# Patient Record
Sex: Female | Born: 1945
Health system: Southern US, Community
[De-identification: ages and names within clinical notes are randomized; demographics above are authoritative.]

## PROBLEM LIST (undated history)

## (undated) DIAGNOSIS — H5440 Blindness, one eye, unspecified eye: Secondary | ICD-10-CM

## (undated) DIAGNOSIS — I999 Unspecified disorder of circulatory system: Secondary | ICD-10-CM

## (undated) DIAGNOSIS — E119 Type 2 diabetes mellitus without complications: Secondary | ICD-10-CM

## (undated) DIAGNOSIS — E785 Hyperlipidemia, unspecified: Secondary | ICD-10-CM

## (undated) DIAGNOSIS — H409 Unspecified glaucoma: Secondary | ICD-10-CM

## (undated) DIAGNOSIS — I82409 Acute embolism and thrombosis of unspecified deep veins of unspecified lower extremity: Secondary | ICD-10-CM

## (undated) DIAGNOSIS — F32A Depression, unspecified: Secondary | ICD-10-CM

## (undated) DIAGNOSIS — I509 Heart failure, unspecified: Secondary | ICD-10-CM

## (undated) DIAGNOSIS — I5189 Other ill-defined heart diseases: Secondary | ICD-10-CM

## (undated) DIAGNOSIS — F329 Major depressive disorder, single episode, unspecified: Secondary | ICD-10-CM

## (undated) DIAGNOSIS — I10 Essential (primary) hypertension: Secondary | ICD-10-CM

## (undated) HISTORY — DX: Blindness, one eye, unspecified eye: H54.40

## (undated) HISTORY — DX: Unspecified glaucoma: H40.9

## (undated) HISTORY — DX: Type 2 diabetes mellitus without complications: E11.9

## (undated) HISTORY — DX: Depression, unspecified: F32.A

## (undated) HISTORY — DX: Unspecified disorder of circulatory system: I99.9

## (undated) HISTORY — PX: VASCULAR SURGERY: SHX849

## (undated) HISTORY — DX: Hyperlipidemia, unspecified: E78.5

## (undated) HISTORY — DX: Heart failure, unspecified: I50.9

## (undated) HISTORY — DX: Major depressive disorder, single episode, unspecified: F32.9

## (undated) HISTORY — DX: Acute embolism and thrombosis of unspecified deep veins of unspecified lower extremity: I82.409

## (undated) HISTORY — PX: EYE SURGERY: SHX253

## (undated) HISTORY — DX: Essential (primary) hypertension: I10

---

## 1990-05-26 HISTORY — PX: ABDOMINAL HYSTERECTOMY: SHX81

## 2006-12-04 ENCOUNTER — Emergency Department: Payer: Self-pay | Admitting: Emergency Medicine

## 2006-12-04 ENCOUNTER — Other Ambulatory Visit: Payer: Self-pay

## 2007-01-20 ENCOUNTER — Ambulatory Visit: Payer: Self-pay | Admitting: Ophthalmology

## 2007-03-10 ENCOUNTER — Ambulatory Visit: Payer: Self-pay | Admitting: Family Medicine

## 2007-03-10 LAB — HM DEXA SCAN: HM DEXA SCAN: NORMAL

## 2007-06-02 ENCOUNTER — Ambulatory Visit: Payer: Self-pay | Admitting: Ophthalmology

## 2009-04-11 ENCOUNTER — Ambulatory Visit (HOSPITAL_COMMUNITY): Admission: RE | Admit: 2009-04-11 | Discharge: 2009-04-11 | Payer: Self-pay | Admitting: Cardiovascular Disease

## 2009-04-24 ENCOUNTER — Ambulatory Visit: Payer: Self-pay | Admitting: Vascular Surgery

## 2009-05-26 DIAGNOSIS — I82409 Acute embolism and thrombosis of unspecified deep veins of unspecified lower extremity: Secondary | ICD-10-CM

## 2009-05-26 HISTORY — DX: Acute embolism and thrombosis of unspecified deep veins of unspecified lower extremity: I82.409

## 2009-05-26 HISTORY — PX: TOE AMPUTATION: SHX809

## 2009-08-03 ENCOUNTER — Ambulatory Visit: Payer: Self-pay | Admitting: Vascular Surgery

## 2009-08-07 ENCOUNTER — Ambulatory Visit: Payer: Self-pay | Admitting: Podiatry

## 2009-08-10 ENCOUNTER — Ambulatory Visit: Payer: Self-pay | Admitting: Podiatry

## 2009-08-24 ENCOUNTER — Ambulatory Visit: Payer: Self-pay | Admitting: Podiatry

## 2009-09-03 ENCOUNTER — Emergency Department: Payer: Self-pay | Admitting: Emergency Medicine

## 2009-09-06 ENCOUNTER — Ambulatory Visit: Payer: Self-pay | Admitting: Internal Medicine

## 2009-09-07 ENCOUNTER — Ambulatory Visit: Payer: Self-pay | Admitting: Internal Medicine

## 2009-09-13 ENCOUNTER — Emergency Department: Payer: Self-pay | Admitting: Emergency Medicine

## 2009-10-12 ENCOUNTER — Encounter: Payer: Self-pay | Admitting: Internal Medicine

## 2009-10-24 ENCOUNTER — Encounter: Payer: Self-pay | Admitting: Internal Medicine

## 2009-12-03 ENCOUNTER — Emergency Department: Payer: Self-pay | Admitting: Emergency Medicine

## 2009-12-25 ENCOUNTER — Ambulatory Visit: Payer: Self-pay | Admitting: Internal Medicine

## 2009-12-25 ENCOUNTER — Inpatient Hospital Stay: Payer: Self-pay | Admitting: Vascular Surgery

## 2010-01-21 ENCOUNTER — Ambulatory Visit: Payer: Self-pay | Admitting: Internal Medicine

## 2010-01-23 ENCOUNTER — Encounter: Payer: Self-pay | Admitting: Internal Medicine

## 2010-01-24 ENCOUNTER — Encounter: Payer: Self-pay | Admitting: Internal Medicine

## 2010-02-23 ENCOUNTER — Encounter: Payer: Self-pay | Admitting: Internal Medicine

## 2010-03-27 ENCOUNTER — Ambulatory Visit: Payer: Self-pay | Admitting: Vascular Surgery

## 2010-03-29 ENCOUNTER — Inpatient Hospital Stay: Payer: Self-pay | Admitting: Podiatry

## 2010-04-01 LAB — PATHOLOGY REPORT

## 2010-05-01 ENCOUNTER — Ambulatory Visit: Payer: Self-pay | Admitting: Pain Medicine

## 2010-06-04 ENCOUNTER — Ambulatory Visit: Payer: Self-pay | Admitting: Pain Medicine

## 2010-08-05 ENCOUNTER — Ambulatory Visit: Payer: Self-pay | Admitting: Vascular Surgery

## 2011-06-06 DIAGNOSIS — H4010X0 Unspecified open-angle glaucoma, stage unspecified: Secondary | ICD-10-CM | POA: Diagnosis not present

## 2011-07-03 DIAGNOSIS — H4010X0 Unspecified open-angle glaucoma, stage unspecified: Secondary | ICD-10-CM | POA: Diagnosis not present

## 2011-08-20 DIAGNOSIS — E1139 Type 2 diabetes mellitus with other diabetic ophthalmic complication: Secondary | ICD-10-CM | POA: Diagnosis not present

## 2011-08-20 DIAGNOSIS — E559 Vitamin D deficiency, unspecified: Secondary | ICD-10-CM | POA: Diagnosis not present

## 2011-08-20 DIAGNOSIS — E785 Hyperlipidemia, unspecified: Secondary | ICD-10-CM | POA: Diagnosis not present

## 2011-08-20 DIAGNOSIS — I10 Essential (primary) hypertension: Secondary | ICD-10-CM | POA: Diagnosis not present

## 2011-08-21 DIAGNOSIS — H4010X0 Unspecified open-angle glaucoma, stage unspecified: Secondary | ICD-10-CM | POA: Diagnosis not present

## 2011-09-04 DIAGNOSIS — E785 Hyperlipidemia, unspecified: Secondary | ICD-10-CM | POA: Diagnosis not present

## 2011-09-04 DIAGNOSIS — I70219 Atherosclerosis of native arteries of extremities with intermittent claudication, unspecified extremity: Secondary | ICD-10-CM | POA: Diagnosis not present

## 2011-09-04 DIAGNOSIS — M79609 Pain in unspecified limb: Secondary | ICD-10-CM | POA: Diagnosis not present

## 2011-09-04 DIAGNOSIS — I10 Essential (primary) hypertension: Secondary | ICD-10-CM | POA: Diagnosis not present

## 2011-12-04 DIAGNOSIS — H4010X0 Unspecified open-angle glaucoma, stage unspecified: Secondary | ICD-10-CM | POA: Diagnosis not present

## 2011-12-23 DIAGNOSIS — I10 Essential (primary) hypertension: Secondary | ICD-10-CM | POA: Diagnosis not present

## 2011-12-23 DIAGNOSIS — E785 Hyperlipidemia, unspecified: Secondary | ICD-10-CM | POA: Diagnosis not present

## 2011-12-23 DIAGNOSIS — F329 Major depressive disorder, single episode, unspecified: Secondary | ICD-10-CM | POA: Diagnosis not present

## 2011-12-23 DIAGNOSIS — E1139 Type 2 diabetes mellitus with other diabetic ophthalmic complication: Secondary | ICD-10-CM | POA: Diagnosis not present

## 2012-02-02 DIAGNOSIS — H33009 Unspecified retinal detachment with retinal break, unspecified eye: Secondary | ICD-10-CM | POA: Diagnosis not present

## 2012-02-05 DIAGNOSIS — H4010X0 Unspecified open-angle glaucoma, stage unspecified: Secondary | ICD-10-CM | POA: Diagnosis not present

## 2012-03-11 DIAGNOSIS — H4010X0 Unspecified open-angle glaucoma, stage unspecified: Secondary | ICD-10-CM | POA: Diagnosis not present

## 2012-04-15 DIAGNOSIS — H4010X0 Unspecified open-angle glaucoma, stage unspecified: Secondary | ICD-10-CM | POA: Diagnosis not present

## 2012-04-27 DIAGNOSIS — E785 Hyperlipidemia, unspecified: Secondary | ICD-10-CM | POA: Diagnosis not present

## 2012-04-27 DIAGNOSIS — Z1159 Encounter for screening for other viral diseases: Secondary | ICD-10-CM | POA: Diagnosis not present

## 2012-04-27 DIAGNOSIS — E559 Vitamin D deficiency, unspecified: Secondary | ICD-10-CM | POA: Diagnosis not present

## 2012-04-27 DIAGNOSIS — R5381 Other malaise: Secondary | ICD-10-CM | POA: Diagnosis not present

## 2012-04-27 DIAGNOSIS — S98139A Complete traumatic amputation of one unspecified lesser toe, initial encounter: Secondary | ICD-10-CM | POA: Diagnosis not present

## 2012-04-27 DIAGNOSIS — R5383 Other fatigue: Secondary | ICD-10-CM | POA: Diagnosis not present

## 2012-04-27 DIAGNOSIS — I10 Essential (primary) hypertension: Secondary | ICD-10-CM | POA: Diagnosis not present

## 2012-04-27 DIAGNOSIS — E1139 Type 2 diabetes mellitus with other diabetic ophthalmic complication: Secondary | ICD-10-CM | POA: Diagnosis not present

## 2012-07-15 DIAGNOSIS — H4010X0 Unspecified open-angle glaucoma, stage unspecified: Secondary | ICD-10-CM | POA: Diagnosis not present

## 2012-09-20 DIAGNOSIS — H33009 Unspecified retinal detachment with retinal break, unspecified eye: Secondary | ICD-10-CM | POA: Diagnosis not present

## 2012-09-29 DIAGNOSIS — M79609 Pain in unspecified limb: Secondary | ICD-10-CM | POA: Diagnosis not present

## 2012-09-29 DIAGNOSIS — E119 Type 2 diabetes mellitus without complications: Secondary | ICD-10-CM | POA: Diagnosis not present

## 2012-09-29 DIAGNOSIS — I70219 Atherosclerosis of native arteries of extremities with intermittent claudication, unspecified extremity: Secondary | ICD-10-CM | POA: Diagnosis not present

## 2012-09-29 DIAGNOSIS — I739 Peripheral vascular disease, unspecified: Secondary | ICD-10-CM | POA: Diagnosis not present

## 2012-10-28 DIAGNOSIS — H4010X0 Unspecified open-angle glaucoma, stage unspecified: Secondary | ICD-10-CM | POA: Diagnosis not present

## 2013-03-01 DIAGNOSIS — E11319 Type 2 diabetes mellitus with unspecified diabetic retinopathy without macular edema: Secondary | ICD-10-CM | POA: Diagnosis not present

## 2013-03-01 DIAGNOSIS — Z23 Encounter for immunization: Secondary | ICD-10-CM | POA: Diagnosis not present

## 2013-03-01 DIAGNOSIS — E1139 Type 2 diabetes mellitus with other diabetic ophthalmic complication: Secondary | ICD-10-CM | POA: Diagnosis not present

## 2013-03-01 DIAGNOSIS — I10 Essential (primary) hypertension: Secondary | ICD-10-CM | POA: Diagnosis not present

## 2013-03-01 DIAGNOSIS — E785 Hyperlipidemia, unspecified: Secondary | ICD-10-CM | POA: Diagnosis not present

## 2013-03-01 DIAGNOSIS — Z1331 Encounter for screening for depression: Secondary | ICD-10-CM | POA: Diagnosis not present

## 2013-03-29 ENCOUNTER — Ambulatory Visit: Payer: Self-pay | Admitting: Family Medicine

## 2013-03-29 DIAGNOSIS — Z1231 Encounter for screening mammogram for malignant neoplasm of breast: Secondary | ICD-10-CM | POA: Diagnosis not present

## 2013-03-29 LAB — HM MAMMOGRAPHY: HM Mammogram: NORMAL

## 2013-04-06 DIAGNOSIS — I10 Essential (primary) hypertension: Secondary | ICD-10-CM | POA: Diagnosis not present

## 2013-04-06 DIAGNOSIS — I70219 Atherosclerosis of native arteries of extremities with intermittent claudication, unspecified extremity: Secondary | ICD-10-CM | POA: Diagnosis not present

## 2013-04-06 DIAGNOSIS — F17200 Nicotine dependence, unspecified, uncomplicated: Secondary | ICD-10-CM | POA: Diagnosis not present

## 2013-04-06 DIAGNOSIS — E119 Type 2 diabetes mellitus without complications: Secondary | ICD-10-CM | POA: Diagnosis not present

## 2013-04-07 DIAGNOSIS — H4010X0 Unspecified open-angle glaucoma, stage unspecified: Secondary | ICD-10-CM | POA: Diagnosis not present

## 2013-09-01 DIAGNOSIS — IMO0001 Reserved for inherently not codable concepts without codable children: Secondary | ICD-10-CM | POA: Diagnosis not present

## 2013-09-01 DIAGNOSIS — I10 Essential (primary) hypertension: Secondary | ICD-10-CM | POA: Diagnosis not present

## 2013-09-01 DIAGNOSIS — E1142 Type 2 diabetes mellitus with diabetic polyneuropathy: Secondary | ICD-10-CM | POA: Diagnosis not present

## 2013-09-01 DIAGNOSIS — E1149 Type 2 diabetes mellitus with other diabetic neurological complication: Secondary | ICD-10-CM | POA: Diagnosis not present

## 2013-10-04 DIAGNOSIS — I70219 Atherosclerosis of native arteries of extremities with intermittent claudication, unspecified extremity: Secondary | ICD-10-CM | POA: Diagnosis not present

## 2013-10-04 DIAGNOSIS — I739 Peripheral vascular disease, unspecified: Secondary | ICD-10-CM | POA: Diagnosis not present

## 2013-10-04 DIAGNOSIS — F17200 Nicotine dependence, unspecified, uncomplicated: Secondary | ICD-10-CM | POA: Diagnosis not present

## 2013-10-04 DIAGNOSIS — E119 Type 2 diabetes mellitus without complications: Secondary | ICD-10-CM | POA: Diagnosis not present

## 2013-10-06 DIAGNOSIS — H4010X0 Unspecified open-angle glaucoma, stage unspecified: Secondary | ICD-10-CM | POA: Diagnosis not present

## 2014-03-08 DIAGNOSIS — E1140 Type 2 diabetes mellitus with diabetic neuropathy, unspecified: Secondary | ICD-10-CM | POA: Diagnosis not present

## 2014-03-08 DIAGNOSIS — E559 Vitamin D deficiency, unspecified: Secondary | ICD-10-CM | POA: Diagnosis not present

## 2014-03-08 DIAGNOSIS — E785 Hyperlipidemia, unspecified: Secondary | ICD-10-CM | POA: Diagnosis not present

## 2014-03-08 DIAGNOSIS — R5383 Other fatigue: Secondary | ICD-10-CM | POA: Diagnosis not present

## 2014-03-08 DIAGNOSIS — Z23 Encounter for immunization: Secondary | ICD-10-CM | POA: Diagnosis not present

## 2014-03-08 DIAGNOSIS — I739 Peripheral vascular disease, unspecified: Secondary | ICD-10-CM | POA: Diagnosis not present

## 2014-03-08 DIAGNOSIS — E1142 Type 2 diabetes mellitus with diabetic polyneuropathy: Secondary | ICD-10-CM | POA: Diagnosis not present

## 2014-03-08 DIAGNOSIS — G629 Polyneuropathy, unspecified: Secondary | ICD-10-CM | POA: Diagnosis not present

## 2014-03-08 DIAGNOSIS — R5381 Other malaise: Secondary | ICD-10-CM | POA: Diagnosis not present

## 2014-03-08 DIAGNOSIS — F329 Major depressive disorder, single episode, unspecified: Secondary | ICD-10-CM | POA: Diagnosis not present

## 2014-03-08 DIAGNOSIS — I10 Essential (primary) hypertension: Secondary | ICD-10-CM | POA: Diagnosis not present

## 2014-03-08 LAB — LIPID PANEL
CHOLESTEROL: 136 mg/dL (ref 0–200)
HDL: 61 mg/dL (ref 35–70)
LDL Cholesterol: 55 mg/dL
TRIGLYCERIDES: 102 mg/dL (ref 40–160)

## 2014-04-04 DIAGNOSIS — H4011X2 Primary open-angle glaucoma, moderate stage: Secondary | ICD-10-CM | POA: Diagnosis not present

## 2014-06-21 DIAGNOSIS — L989 Disorder of the skin and subcutaneous tissue, unspecified: Secondary | ICD-10-CM | POA: Diagnosis not present

## 2014-07-13 ENCOUNTER — Encounter: Payer: Self-pay | Admitting: *Deleted

## 2014-07-13 DIAGNOSIS — G5791 Unspecified mononeuropathy of right lower limb: Secondary | ICD-10-CM | POA: Diagnosis not present

## 2014-07-13 DIAGNOSIS — L989 Disorder of the skin and subcutaneous tissue, unspecified: Secondary | ICD-10-CM | POA: Diagnosis not present

## 2014-07-13 DIAGNOSIS — I10 Essential (primary) hypertension: Secondary | ICD-10-CM | POA: Diagnosis not present

## 2014-07-13 DIAGNOSIS — E1140 Type 2 diabetes mellitus with diabetic neuropathy, unspecified: Secondary | ICD-10-CM | POA: Diagnosis not present

## 2014-07-13 DIAGNOSIS — I739 Peripheral vascular disease, unspecified: Secondary | ICD-10-CM | POA: Diagnosis not present

## 2014-07-13 DIAGNOSIS — R809 Proteinuria, unspecified: Secondary | ICD-10-CM | POA: Diagnosis not present

## 2014-07-13 DIAGNOSIS — G5792 Unspecified mononeuropathy of left lower limb: Secondary | ICD-10-CM | POA: Diagnosis not present

## 2014-07-13 DIAGNOSIS — E538 Deficiency of other specified B group vitamins: Secondary | ICD-10-CM | POA: Diagnosis not present

## 2014-07-13 LAB — HEMOGLOBIN A1C: HEMOGLOBIN A1C: 6.5 % — AB (ref 4.0–6.0)

## 2014-07-27 ENCOUNTER — Ambulatory Visit (INDEPENDENT_AMBULATORY_CARE_PROVIDER_SITE_OTHER): Payer: Medicare Other | Admitting: General Surgery

## 2014-07-27 ENCOUNTER — Encounter: Payer: Self-pay | Admitting: General Surgery

## 2014-07-27 VITALS — BP 130/74 | HR 72 | Resp 12 | Ht 64.5 in | Wt 155.0 lb

## 2014-07-27 DIAGNOSIS — L98499 Non-pressure chronic ulcer of skin of other sites with unspecified severity: Secondary | ICD-10-CM | POA: Diagnosis not present

## 2014-07-27 NOTE — Progress Notes (Signed)
Patient ID: Lisa AngelBeulah Koppel, female   DOB: [birth year], 69 y.o.   MRN: [id number]  Chief Complaint  Patient presents with  . Other    evaluation of lesion on back of leg    HPI Lisa [name] is a 69 y.o. female who presents for an evaluation of a right thigh mole. She noticed it last year. She states the area is painful and has gotten larger. She has had pus and blood to drain from the area.   HPI  Past Medical History  Diagnosis Date  . Diabetes mellitus without complication   . Hypertension   . Glaucoma   . Vascular disease   . Blindness of right eye     Past Surgical History  Procedure Laterality Date  . Abdominal hysterectomy    . Eye surgery    . Vascular surgery    . Toe amputation Left 2011    all toes    Family History  Problem Relation Age of Onset  . Diabetes Mother   . Diabetes Sister   . Diabetes Sister     Social History History  Substance Use Topics  . Smoking status: Current Every Day Smoker -- 0.50 packs/day for 18 years  . Smokeless tobacco: Never Used  . Alcohol Use: Yes    Allergies  Allergen Reactions  . Other Itching    Nucinta    Current Outpatient Prescriptions  Medication Sig Dispense Refill  . aspirin 81 MG tablet Take 81 mg by mouth daily.    Marland Kitchen. atropine 1 % ophthalmic ointment Place 1 application into the right eye 3 (three) times daily.    . citalopram (CELEXA) 20 MG tablet Take 20 mg by mouth daily.  3  . clopidogrel (PLAVIX) 75 MG tablet Take 75 mg by mouth daily.  5  . CRESTOR 10 MG tablet Take 10 mg by mouth daily.  3  . dorzolamide-timolol (COSOPT) 22.3-6.8 MG/ML ophthalmic solution Place 1 drop into both eyes 2 (two) times daily.  6  . HYDROcodone-acetaminophen (NORCO/VICODIN) 5-325 MG per tablet Take 1 tablet by mouth every 6 (six) hours as needed. for pain  0  . JANUMET XR [phone] MG TB24 Take 1 tablet by mouth daily.  2  . losartan-hydrochlorothiazide (HYZAAR) 50-12.5 MG per tablet Take 1 tablet by mouth daily.  2  .  Multiple Vitamin (MULTIVITAMIN) tablet Take 1 tablet by mouth daily.    . prednisoLONE acetate (PRED FORTE) 1 % ophthalmic suspension Place 1 drop into the right eye 4 (four) times daily.  3  . sulfamethoxazole-trimethoprim (BACTRIM,SEPTRA) 400-80 MG per tablet Take 1 tablet by mouth 2 (two) times daily.  0  . traMADol (ULTRAM) 50 MG tablet Take 50 mg by mouth every 6 (six) hours as needed.    . vitamin B-12 (CYANOCOBALAMIN) 1000 MCG tablet Take 1,000 mcg by mouth daily.     No current facility-administered medications for this visit.    Review of Systems Review of Systems  Constitutional: Negative.   Respiratory: Negative.   Cardiovascular: Negative.     Blood pressure 130/74, pulse 72, resp. rate 12, height 5' 4.5" (1.638 m), weight 155 lb (70.308 kg).  Physical Exam Physical Exam  Constitutional: She is oriented to person, place, and time. She appears well-developed and well-nourished.  Cardiovascular: Normal rate, regular rhythm and normal heart sounds.   No murmur heard. Pulmonary/Chest: Effort normal and breath sounds normal.  Lymphadenopathy:       Right: No inguinal adenopathy present.  Left: No inguinal adenopathy present.  Neurological: She is alert and oriented to person, place, and time.  Skin: Skin is warm and dry.  2 cm circular ulcer on the back of right thigh. Appears covered with fibrinous exudate. The area looks clean and not infected.  Margin is sharp and not everted  Data Reviewed Dr. Carlynn Purl' notes  Assessment    Skin ulcer right thgh     Plan    Dress with antibiotic int daily. Recheck in weeks. If no sign of healing will need surgical excision. Pt agreeable with the plan.       SANKAR,SEEPLAPUTHUR G 07/27/2014, 11:13 AM

## 2014-07-27 NOTE — Patient Instructions (Addendum)
Patient advised to use neosporin once daily on the area and cover with large band aid. Patient also advised not to pick at the area. Patient to return in 2 weeks for follow up appointment.

## 2014-08-10 ENCOUNTER — Ambulatory Visit (INDEPENDENT_AMBULATORY_CARE_PROVIDER_SITE_OTHER): Payer: Medicare Other | Admitting: General Surgery

## 2014-08-10 ENCOUNTER — Encounter: Payer: Self-pay | Admitting: General Surgery

## 2014-08-10 VITALS — BP 120/72 | HR 74 | Resp 12 | Ht 64.5 in | Wt 149.0 lb

## 2014-08-10 DIAGNOSIS — L98499 Non-pressure chronic ulcer of skin of other sites with unspecified severity: Secondary | ICD-10-CM

## 2014-08-10 NOTE — Progress Notes (Signed)
[name], female   DOB: 1945-12-19, 69 y.o.   MRN: [id number]  Chief Complaint  Patient presents with  . Follow-up    HPI Lisa AngelBeulah Leavelle is a 69 y.o. female.  here today for follow up right thigh mass. Minimal drainage and minimal tenderness. She states it looks better.    HPI  Past Medical History  Diagnosis Date  . Diabetes mellitus without complication   . Hypertension   . Glaucoma   . Vascular disease   . Blindness of right eye     Past Surgical History  Procedure Laterality Date  . Abdominal hysterectomy    . Eye surgery    . Vascular surgery    . Toe amputation Left 2011    all toes    Family History  Problem Relation Age of Onset  . Diabetes Mother   . Diabetes Sister   . Diabetes Sister     Social History History  Substance Use Topics  . Smoking status: Current Every Day Smoker -- 0.50 packs/day for 18 years  . Smokeless tobacco: Never Used  . Alcohol Use: Yes    Allergies  Allergen Reactions  . Other Itching    Nucinta    Current Outpatient Prescriptions  Medication Sig Dispense Refill  . aspirin 81 MG tablet Take 81 mg by mouth daily.    Marland Kitchen. atropine 1 % ophthalmic ointment Place 1 application into the right eye 3 (three) times daily.    . citalopram (CELEXA) 20 MG tablet Take 20 mg by mouth daily.  3  . clopidogrel (PLAVIX) 75 MG tablet Take 75 mg by mouth daily.  5  . CRESTOR 10 MG tablet Take 10 mg by mouth daily.  3  . dorzolamide-timolol (COSOPT) 22.3-6.8 MG/ML ophthalmic solution Place 1 drop into both eyes 2 (two) times daily.  6  . JANUMET XR [phone] MG TB24 Take 1 tablet by mouth daily.  2  . losartan-hydrochlorothiazide (HYZAAR) 50-12.5 MG per tablet Take 1 tablet by mouth daily.  2  . Multiple Vitamin (MULTIVITAMIN) tablet Take 1 tablet by mouth daily.    . prednisoLONE acetate (PRED FORTE) 1 % ophthalmic suspension Place 1 drop into the right eye 4 (four) times daily.  3  . vitamin B-12 (CYANOCOBALAMIN) 1000 MCG  tablet Take 1,000 mcg by mouth daily.     No current facility-administered medications for this visit.    Review of Systems Review of Systems  Constitutional: Negative.   Respiratory: Negative.   Cardiovascular: Negative.     Blood pressure 120/72, pulse 74, resp. rate 12, height 5' 4.5" (1.638 m), weight 149 lb (67.586 kg).  Physical Exam Physical Exam  Constitutional: She is oriented to person, place, and time. She appears well-developed and well-nourished.  Neurological: She is alert and oriented to person, place, and time.  Skin: Skin is warm and dry.  Right posterior thigh ulcer covered with fibrinous exudate.  This was debrided with q tip  Data Reviewed Office notes.  Assessment    Right posterior thigh ulcer covered with fibrinous exudate. It does appear a bit smaller.    Plan    Follow up in 3-4 weeks. Continue neosporin ointment and band aid.       Ihsan Nomura G 08/14/2014, 3:33 PM

## 2014-08-10 NOTE — Patient Instructions (Addendum)
The patient is aware to call back for any questions or concerns. Continue neosporin ointment and band aid.

## 2014-08-14 ENCOUNTER — Encounter: Payer: Self-pay | Admitting: General Surgery

## 2014-08-30 ENCOUNTER — Ambulatory Visit: Payer: Medicare Other | Admitting: General Surgery

## 2014-10-20 DIAGNOSIS — H4011X3 Primary open-angle glaucoma, severe stage: Secondary | ICD-10-CM | POA: Diagnosis not present

## 2014-10-25 ENCOUNTER — Encounter: Payer: Self-pay | Admitting: *Deleted

## 2014-10-28 ENCOUNTER — Other Ambulatory Visit: Payer: Self-pay | Admitting: Family Medicine

## 2014-11-13 ENCOUNTER — Ambulatory Visit: Payer: Self-pay | Admitting: Family Medicine

## 2014-12-02 ENCOUNTER — Encounter: Payer: Self-pay | Admitting: Family Medicine

## 2014-12-02 DIAGNOSIS — H3320 Serous retinal detachment, unspecified eye: Secondary | ICD-10-CM | POA: Insufficient documentation

## 2014-12-02 DIAGNOSIS — E1142 Type 2 diabetes mellitus with diabetic polyneuropathy: Secondary | ICD-10-CM | POA: Insufficient documentation

## 2014-12-02 DIAGNOSIS — Z720 Tobacco use: Secondary | ICD-10-CM | POA: Insufficient documentation

## 2014-12-02 DIAGNOSIS — G547 Phantom limb syndrome without pain: Secondary | ICD-10-CM | POA: Insufficient documentation

## 2014-12-02 DIAGNOSIS — I739 Peripheral vascular disease, unspecified: Secondary | ICD-10-CM | POA: Insufficient documentation

## 2014-12-02 DIAGNOSIS — E538 Deficiency of other specified B group vitamins: Secondary | ICD-10-CM | POA: Insufficient documentation

## 2014-12-02 DIAGNOSIS — I70229 Atherosclerosis of native arteries of extremities with rest pain, unspecified extremity: Secondary | ICD-10-CM | POA: Insufficient documentation

## 2014-12-02 DIAGNOSIS — S98139A Complete traumatic amputation of one unspecified lesser toe, initial encounter: Secondary | ICD-10-CM | POA: Insufficient documentation

## 2014-12-02 DIAGNOSIS — E785 Hyperlipidemia, unspecified: Secondary | ICD-10-CM | POA: Insufficient documentation

## 2014-12-02 DIAGNOSIS — E119 Type 2 diabetes mellitus without complications: Secondary | ICD-10-CM | POA: Insufficient documentation

## 2014-12-02 DIAGNOSIS — M792 Neuralgia and neuritis, unspecified: Secondary | ICD-10-CM | POA: Insufficient documentation

## 2014-12-02 DIAGNOSIS — H5440 Blindness, one eye, unspecified eye: Secondary | ICD-10-CM | POA: Insufficient documentation

## 2014-12-02 DIAGNOSIS — I10 Essential (primary) hypertension: Secondary | ICD-10-CM | POA: Insufficient documentation

## 2014-12-02 DIAGNOSIS — E1121 Type 2 diabetes mellitus with diabetic nephropathy: Secondary | ICD-10-CM | POA: Insufficient documentation

## 2014-12-02 DIAGNOSIS — E559 Vitamin D deficiency, unspecified: Secondary | ICD-10-CM | POA: Insufficient documentation

## 2014-12-02 DIAGNOSIS — N3941 Urge incontinence: Secondary | ICD-10-CM | POA: Insufficient documentation

## 2014-12-02 DIAGNOSIS — S98912A Complete traumatic amputation of left foot, level unspecified, initial encounter: Secondary | ICD-10-CM | POA: Insufficient documentation

## 2014-12-02 DIAGNOSIS — F330 Major depressive disorder, recurrent, mild: Secondary | ICD-10-CM | POA: Insufficient documentation

## 2014-12-05 ENCOUNTER — Encounter: Payer: Self-pay | Admitting: Family Medicine

## 2014-12-05 ENCOUNTER — Ambulatory Visit (INDEPENDENT_AMBULATORY_CARE_PROVIDER_SITE_OTHER): Payer: Medicare Other | Admitting: Family Medicine

## 2014-12-05 VITALS — BP 138/82 | HR 90 | Temp 99.2°F | Resp 16 | Ht 65.0 in | Wt 140.6 lb

## 2014-12-05 DIAGNOSIS — Z720 Tobacco use: Secondary | ICD-10-CM | POA: Diagnosis not present

## 2014-12-05 DIAGNOSIS — Z89422 Acquired absence of other left toe(s): Secondary | ICD-10-CM | POA: Diagnosis not present

## 2014-12-05 DIAGNOSIS — I10 Essential (primary) hypertension: Secondary | ICD-10-CM

## 2014-12-05 DIAGNOSIS — F32A Depression, unspecified: Secondary | ICD-10-CM

## 2014-12-05 DIAGNOSIS — B3731 Acute candidiasis of vulva and vagina: Secondary | ICD-10-CM

## 2014-12-05 DIAGNOSIS — B373 Candidiasis of vulva and vagina: Secondary | ICD-10-CM

## 2014-12-05 DIAGNOSIS — S98132A Complete traumatic amputation of one left lesser toe, initial encounter: Secondary | ICD-10-CM

## 2014-12-05 DIAGNOSIS — I739 Peripheral vascular disease, unspecified: Secondary | ICD-10-CM | POA: Diagnosis not present

## 2014-12-05 DIAGNOSIS — E1159 Type 2 diabetes mellitus with other circulatory complications: Secondary | ICD-10-CM | POA: Diagnosis not present

## 2014-12-05 DIAGNOSIS — F329 Major depressive disorder, single episode, unspecified: Secondary | ICD-10-CM | POA: Diagnosis not present

## 2014-12-05 DIAGNOSIS — E1151 Type 2 diabetes mellitus with diabetic peripheral angiopathy without gangrene: Secondary | ICD-10-CM

## 2014-12-05 DIAGNOSIS — G629 Polyneuropathy, unspecified: Secondary | ICD-10-CM | POA: Diagnosis not present

## 2014-12-05 DIAGNOSIS — E785 Hyperlipidemia, unspecified: Secondary | ICD-10-CM

## 2014-12-05 DIAGNOSIS — M792 Neuralgia and neuritis, unspecified: Secondary | ICD-10-CM

## 2014-12-05 LAB — POCT URINALYSIS DIPSTICK
BILIRUBIN UA: NEGATIVE
Blood, UA: NEGATIVE
GLUCOSE UA: NEGATIVE
Ketones, UA: NEGATIVE
LEUKOCYTES UA: NEGATIVE
Nitrite, UA: NEGATIVE
PH UA: 6
Protein, UA: NEGATIVE
Spec Grav, UA: 1.01
UROBILINOGEN UA: 0.2

## 2014-12-05 LAB — POCT GLYCOSYLATED HEMOGLOBIN (HGB A1C): Hemoglobin A1C: 12.7

## 2014-12-05 MED ORDER — GLIPIZIDE ER 5 MG PO TB24
5.0000 mg | ORAL_TABLET | Freq: Every day | ORAL | Status: DC
Start: 2014-12-05 — End: 2015-05-08

## 2014-12-05 MED ORDER — CITALOPRAM HYDROBROMIDE 20 MG PO TABS: 20.0000 mg | ORAL_TABLET | Freq: Every day | ORAL | Status: DC

## 2014-12-05 MED ORDER — METFORMIN HCL ER (OSM) 1000 MG PO TB24: 1000.0000 mg | ORAL_TABLET | Freq: Every day | ORAL | Status: DC

## 2014-12-05 MED ORDER — ROSUVASTATIN CALCIUM 10 MG PO TABS: 10.0000 mg | ORAL_TABLET | Freq: Every day | ORAL | Status: DC

## 2014-12-05 MED ORDER — FLUCONAZOLE 150 MG PO TABS: 150.0000 mg | ORAL_TABLET | ORAL | Status: DC

## 2014-12-05 MED ORDER — LOSARTAN POTASSIUM-HCTZ 50-12.5 MG PO TABS
1.0000 | ORAL_TABLET | Freq: Every day | ORAL | Status: DC
Start: 2014-12-05 — End: 2014-12-22

## 2014-12-05 MED ORDER — CLOPIDOGREL BISULFATE 75 MG PO TABS: 75.0000 mg | ORAL_TABLET | Freq: Every day | ORAL | Status: DC

## 2014-12-05 NOTE — Progress Notes (Signed)
Name: Lisa [name]   MRN: [id number]    DOB: Apr 25, 1946   Date:12/05/2014       Progress Note  Subjective  Chief Complaint  Chief Complaint  Patient presents with  . Diabetes    pt states not checking sugar due to no meter and cannot afford test strips, stopped janumet states it makes her have loose stool and nausea  . Hypertension    very tiny headache  . Depression  . Vaginal Itching    no discharge, using cortisone and washing well    HPI  DMII: she states Janumet was causing side effects, so in February we stopped Janumet and switched to [place], but she never got prescriptions filled and has been without any medications. She has not been checking glucose at home. She is having polydipsia , polyuria but no polyphagia.  She has noticed vaginal itching also feeling tired more than usual over the past couple of days.  HTN: taking medications, denies side effects. Mild headache intermittently  Depression: she states recently death in her family, but doing well on Citalopram, being more independent, getting out the house more, no crying spells, no suicidal thoughts or ideation.   Vitamin B12 and Vitamin D deficiency: she stopped taking supplementations on her own  Patient Active Problem List   Diagnosis Date Noted  . Amputated toe 12/02/2014  . Benign essential HTN 12/02/2014  . Clinical depression 12/02/2014  . Dyslipidemia 12/02/2014  . Vitamin B12 deficiency 12/02/2014  . Peripheral neuropathic pain 12/02/2014  . Peripheral artery disease 12/02/2014  . Phantom limb 12/02/2014  . Abnormal presence of protein in urine 12/02/2014  . Detached retina 12/02/2014  . Type 2 diabetes mellitus with peripheral neuropathy 12/02/2014  . Tobacco use 12/02/2014  . Urge incontinence 12/02/2014  . 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  . Vitamin D deficiency 12/02/2014    Past Surgical History  Procedure Laterality Date  . Abdominal hysterectomy    . Eye surgery    . Vascular surgery    .  Toe amputation Left 2011    all toes    Family History  Problem Relation Age of Onset  . Diabetes Mother   . Diabetes Sister   . Diabetes Sister     History   Social History  . Marital Status: Widowed    Spouse Name: N/A  . Number of Children: N/A  . Years of Education: N/A   Occupational History  . Not on file.   Social History Main Topics  . Smoking status: Current Every Day Smoker -- 0.50 packs/day for 18 years  . Smokeless tobacco: Never Used  . Alcohol Use: No  . Drug Use: No  . Sexual Activity: Not Currently   Other Topics Concern  . Not on file   Social History Narrative     Current outpatient prescriptions:  .  aspirin 81 MG tablet, Take 81 mg by mouth daily., Disp: , Rfl:  .  atropine 1 % ophthalmic ointment, Place 1 application into the right eye 3 (three) times daily., Disp: , Rfl:  .  blood glucose meter kit and supplies, , Disp: , Rfl:  .  Cholecalciferol (VITAMIN D) 2000 UNITS tablet, Take 1 tablet by mouth daily., Disp: , Rfl:  .  citalopram (CELEXA) 20 MG tablet, Take 1 tablet (20 mg total) by mouth daily., Disp: 30 tablet, Rfl: 3 .  clopidogrel (PLAVIX) 75 MG tablet, Take 1 tablet (75 mg total) by mouth daily., Disp: 30 tablet, Rfl: 3 .  dorzolamide-timolol (COSOPT) 22.3-6.8 MG/ML ophthalmic solution, Place 1 drop into both eyes 2 (two) times daily., Disp: , Rfl: 6 .  losartan-hydrochlorothiazide (HYZAAR) 50-12.5 MG per tablet, Take 1 tablet by mouth daily., Disp: 30 tablet, Rfl: 3 .  Methylcobalamin (B12-ACTIVE) 1 MG CHEW, Chew 2 tablets by mouth daily., Disp: , Rfl:  .  Multiple Vitamin (MULTIVITAMIN) tablet, Take 1 tablet by mouth daily., Disp: , Rfl:  .  prednisoLONE sodium phosphate (INFLAMASE FORTE) 1 % ophthalmic solution, Place 1 drop into the right eye 4 (four) times daily as needed., Disp: , Rfl:  .  rosuvastatin (CRESTOR) 10 MG tablet, Take 1 tablet (10 mg total) by mouth daily., Disp: 30 tablet, Rfl: 3 .  timolol (BETIMOL) 0.5 % ophthalmic  solution, 1 drop., Disp: , Rfl:  .  glipiZIDE (GLIPIZIDE XL) 5 MG 24 hr tablet, Take 1 tablet (5 mg total) by mouth daily with breakfast., Disp: 30 tablet, Rfl: 0 .  metformin (FORTAMET) 1000 MG (OSM) 24 hr tablet, Take 1 tablet (1,000 mg total) by mouth daily with breakfast., Disp: 30 tablet, Rfl: 0  Allergies  Allergen Reactions  . Other Itching    Nucinta     ROS  Constitutional: Negative for fever but  weight change, lost 9 lbs.  Respiratory: Negative for cough and shortness of breath.   Cardiovascular: Negative for chest pain or palpitations.  Gastrointestinal: Negative for abdominal pain, no bowel changes.  Musculoskeletal: Negative for gait problem or joint swelling.  Skin: Negative for rash.  Neurological: Negative for dizziness, mild  headache .  No other specific complaints in a complete review of systems (except as listed in HPI above).  Objective  Filed Vitals:   12/05/14 1514  BP: 138/82  Pulse: 90  Temp: 99.2 F (37.3 C)  TempSrc: Oral  Resp: 16  Height: 5' 5" (1.651 m)  Weight: 140 lb 9.6 oz (63.776 kg)  SpO2: 96%    Body mass index is 23.4 kg/(m^2).  Physical Exam  Constitutional: Patient appears well-developed and well-nourished. No distress.  Eyes:  No scleral icterus.  Neck: Normal range of motion. Neck supple. Cardiovascular: Normal rate, regular rhythm and normal heart sounds.  No murmur heard. No BLE edema. Pulmonary/Chest: Effort normal and breath sounds normal. No respiratory distress. Abdominal: Soft.  There is no tenderness. Psychiatric: Patient has a normal mood and affect. behavior is normal. Judgment and thought content normal. Muscular Skeletal: left distal third of foot amputated  Recent Results (from the past 2160 hour(s))  POCT HgB A1C     Status: Abnormal   Collection Time: 12/05/14  3:29 PM  Result Value Ref Range   Hemoglobin A1C 12.7   POCT urinalysis dipstick     Status: Normal   Collection Time: 12/05/14  3:29 PM  Result  Value Ref Range   Color, UA yellow    Clarity, UA clear    Glucose, UA neg    Bilirubin, UA neg    Ketones, UA neg    Spec Grav, UA 1.010    Blood, UA neg    pH, UA 6.0    Protein, UA neg    Urobilinogen, UA 0.2    Nitrite, UA neg    Leukocytes, UA Negative Negative    Diabetic Foot Exam: Diabetic Foot Exam - Simple   Simple Foot Form  Visual Inspection  See comments:  Yes  Sensation Testing  Pulse Check  Comments  Half of left foot amputated       PHQ2/9:  Depression screen PHQ 2/9 12/05/2014  Decreased Interest 0  Down, Depressed, Hopeless 0  PHQ - 2 Score 0     Fall Risk: Fall Risk  12/05/2014  Falls in the past year? Yes  Number falls in past yr: 2 or more  Injury with Fall? No     Assessment & Plan  1. DM (diabetes mellitus), type 2 with peripheral vascular complications  Needs to resume diabetic diet, walk more, resume medication and follow up in one month with sugar log - POCT HgB A1C - metformin (FORTAMET) 1000 MG (OSM) 24 hr tablet; Take 1 tablet (1,000 mg total) by mouth daily with breakfast.  Dispense: 30 tablet; Refill: 0 - glipiZIDE (GLIPIZIDE XL) 5 MG 24 hr tablet; Take 1 tablet (5 mg total) by mouth daily with breakfast.  Dispense: 30 tablet; Refill: 0  2. Yeast vaginitis  Start Diflucan 150 mg every other day for 3 days - POCT urinalysis dipstick  3. Peripheral neuropathic pain  - clopidogrel (PLAVIX) 75 MG tablet; Take 1 tablet (75 mg total) by mouth daily.  Dispense: 30 tablet; Refill: 3  4. Dyslipidemia  Continue medication  - rosuvastatin (CRESTOR) 10 MG tablet; Take 1 tablet (10 mg total) by mouth daily.  Dispense: 30 tablet; Refill: 3  5. Benign essential HTN  At goal  - losartan-hydrochlorothiazide (HYZAAR) 50-12.5 MG per tablet; Take 1 tablet by mouth daily.  Dispense: 30 tablet; Refill: 3  6. Amputated toe, left  stable  7. Tobacco use  Explained importance of quitting smoking  8. Clinical depression  Stable on  medication - citalopram (CELEXA) 20 MG tablet; Take 1 tablet (20 mg total) by mouth daily.  Dispense: 30 tablet; Refill: 3   9. Peripheral artery disease  - clopidogrel (PLAVIX) 75 MG tablet; Take 1 tablet (75 mg total) by mouth daily.  Dispense: 30 tablet; Refill: 3

## 2014-12-22 ENCOUNTER — Other Ambulatory Visit: Payer: Self-pay | Admitting: Family Medicine

## 2015-01-09 ENCOUNTER — Encounter: Payer: Self-pay | Admitting: Family Medicine

## 2015-01-09 ENCOUNTER — Ambulatory Visit (INDEPENDENT_AMBULATORY_CARE_PROVIDER_SITE_OTHER): Payer: Medicare Other | Admitting: Family Medicine

## 2015-01-09 VITALS — BP 134/62 | HR 85 | Temp 98.8°F | Resp 18 | Ht 65.0 in | Wt 143.7 lb

## 2015-01-09 DIAGNOSIS — Z79899 Other long term (current) drug therapy: Secondary | ICD-10-CM | POA: Diagnosis not present

## 2015-01-09 DIAGNOSIS — E1159 Type 2 diabetes mellitus with other circulatory complications: Secondary | ICD-10-CM | POA: Diagnosis not present

## 2015-01-09 DIAGNOSIS — E1151 Type 2 diabetes mellitus with diabetic peripheral angiopathy without gangrene: Secondary | ICD-10-CM

## 2015-01-09 NOTE — Progress Notes (Signed)
Name: Lisa [name]   MRN: [id number]    DOB: 10/05/1945   Date:01/09/2015       Progress Note  Subjective  Chief Complaint  Chief Complaint  Patient presents with  . Medication Management    1 month F/U DM, Last visit started Glipizide and patient is taking Metformin  . Diabetes    Patient brought her Meter and states she feels better when her sugar is 120's, Low-79, Average-120's High-326    HPI  Diabetes Type II with PVD and renal disease: patient's last hgbA1C was done in July and it was 12.7 %. She is now checking her fsbs daily, however not taking her medication for DM daily, takes it depending on the glucose level that day. Reviewed her monitor. Two weeks ago glucose was in the 200's and is now is averaging 130's. She had two episode of hypoglycemia, but able to felt it going down and had a snack. She states yeast vaginitis has improved, no polyphagia, polydipsia or polyuria.  Medication Management: she brought all her medication and supplements with her today and asked me to stop some of them. Explained that each one has a different need, and needs to continue to take it. Except for Glipizide that she can take it prn but resume Metformin daily   Patient Active Problem List   Diagnosis Date Noted  . Amputated toe 12/02/2014  . Benign essential HTN 12/02/2014  . Clinical depression 12/02/2014  . Dyslipidemia 12/02/2014  . Vitamin B12 deficiency 12/02/2014  . Peripheral neuropathic pain 12/02/2014  . Peripheral artery disease 12/02/2014  . Phantom limb 12/02/2014  . Abnormal presence of protein in urine 12/02/2014  . Detached retina 12/02/2014  . Type 2 diabetes mellitus with peripheral neuropathy 12/02/2014  . Tobacco use 12/02/2014  . Urge incontinence 12/02/2014  . 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  . Vitamin D deficiency 12/02/2014    Past Surgical History  Procedure Laterality Date  . Abdominal hysterectomy    . Eye surgery    . Vascular surgery    . Toe amputation Left  2011    all toes    Family History  Problem Relation Age of Onset  . Diabetes Mother   . Diabetes Sister   . Diabetes Sister     Social History   Social History  . Marital Status: Widowed    Spouse Name: N/A  . Number of Children: N/A  . Years of Education: N/A   Occupational History  . Not on file.   Social History Main Topics  . Smoking status: Current Every Day Smoker -- 0.50 packs/day for 18 years  . Smokeless tobacco: Never Used  . Alcohol Use: No  . Drug Use: No  . Sexual Activity: Not Currently   Other Topics Concern  . Not on file   Social History Narrative     Current outpatient prescriptions:  .  aspirin 81 MG tablet, Take 81 mg by mouth daily., Disp: , Rfl:  .  atropine 1 % ophthalmic ointment, Place 1 application into the right eye 3 (three) times daily., Disp: , Rfl:  .  blood glucose meter kit and supplies, , Disp: , Rfl:  .  Cholecalciferol (VITAMIN D) 2000 UNITS tablet, Take 1 tablet by mouth daily., Disp: , Rfl:  .  citalopram (CELEXA) 20 MG tablet, Take 1 tablet (20 mg total) by mouth daily., Disp: 30 tablet, Rfl: 3 .  clopidogrel (PLAVIX) 75 MG tablet, Take 1 tablet (75 mg total) by mouth daily., Disp:  30 tablet, Rfl: 3 .  dorzolamide-timolol (COSOPT) 22.3-6.8 MG/ML ophthalmic solution, Place 1 drop into both eyes 2 (two) times daily., Disp: , Rfl: 6 .  glipiZIDE (GLIPIZIDE XL) 5 MG 24 hr tablet, Take 1 tablet (5 mg total) by mouth daily with breakfast., Disp: 30 tablet, Rfl: 0 .  losartan-hydrochlorothiazide (HYZAAR) 50-12.5 MG per tablet, TAKE 1 TABLET BY MOUTH DAILY, Disp: 30 tablet, Rfl: 2 .  metformin (FORTAMET) 1000 MG (OSM) 24 hr tablet, Take 1 tablet (1,000 mg total) by mouth daily with breakfast., Disp: 30 tablet, Rfl: 0 .  Methylcobalamin (B12-ACTIVE) 1 MG CHEW, Chew 2 tablets by mouth daily., Disp: , Rfl:  .  Multiple Vitamin (MULTIVITAMIN) tablet, Take 1 tablet by mouth daily., Disp: , Rfl:  .  prednisoLONE sodium phosphate (INFLAMASE  FORTE) 1 % ophthalmic solution, Place 1 drop into the right eye 4 (four) times daily as needed., Disp: , Rfl:  .  rosuvastatin (CRESTOR) 10 MG tablet, Take 1 tablet (10 mg total) by mouth daily., Disp: 30 tablet, Rfl: 3 .  timolol (BETIMOL) 0.5 % ophthalmic solution, 1 drop., Disp: , Rfl:   Allergies  Allergen Reactions  . Other Itching    Nucinta     ROS  Constitutional: Negative for fever or significant weight change.  Respiratory: Negative for cough and shortness of breath.   Cardiovascular: Negative for chest pain or palpitations.  Gastrointestinal: Negative for abdominal pain, no bowel changes.  Musculoskeletal: Negative for gait problem or joint swelling.  Skin: Negative for rash.  Neurological: Negative for dizziness or headache.  No other specific complaints in a complete review of systems (except as listed in HPI above).  Objective  Filed Vitals:   01/09/15 1407  BP: 134/62  Pulse: 85  Temp: 98.8 F (37.1 C)  TempSrc: Oral  Resp: 18  Height: 5\' 5"  (1.651 m)  Weight: 143 lb 11.2 oz (65.182 kg)  SpO2: 97%    Body mass index is 23.91 kg/(m^2).  Physical Exam  Constitutional: Patient appears well-developed and well-nourished. No distress.  Eyes: No scleral icterus. Right eye non-reactive to light - legally blind from right side Neck: Normal range of motion. Neck supple. Cardiovascular: Normal rate, regular rhythm and normal heart sounds. No murmur heard. No BLE edema. Pulmonary/Chest: Effort normal and breath sounds normal. No respiratory distress. Abdominal: Soft. There is no tenderness. Psychiatric: Patient has a normal mood and affect. behavior is normal. Judgment and thought content normal. Muscular Skeletal: left distal third of foot amputated   Recent Results (from the past 2160 hour(s))  POCT HgB A1C     Status: Abnormal   Collection Time: 12/05/14  3:29 PM  Result Value Ref Range   Hemoglobin A1C 12.7   POCT urinalysis dipstick     Status:  Normal   Collection Time: 12/05/14  3:29 PM  Result Value Ref Range   Color, UA yellow    Clarity, UA clear    Glucose, UA neg    Bilirubin, UA neg    Ketones, UA neg    Spec Grav, UA 1.010    Blood, UA neg    pH, UA 6.0    Protein, UA neg    Urobilinogen, UA 0.2    Nitrite, UA neg    Leukocytes, UA Negative Negative     PHQ2/9: Depression screen [facility] 2/9 12/05/2014  Decreased Interest 0  Down, Depressed, Hopeless 0  PHQ - 2 Score 0     Fall Risk: Fall Risk  12/05/2014  Falls in the past year? Yes  Number falls in past yr: 2 or more  Injury with Fall? No      Assessment & Plan  1. DM (diabetes mellitus), type 2 with peripheral vascular complications Advised to take Metformin ER daily, can take Glipizide prn now, since glucose has improved and she is already skipping both diabetes medications, but explained  The need for compliance with all her medication   2. Medication management Explained the need of each medication she takes

## 2015-01-28 ENCOUNTER — Other Ambulatory Visit: Payer: Self-pay | Admitting: Family Medicine

## 2015-04-12 ENCOUNTER — Ambulatory Visit: Payer: Medicare Other | Admitting: Family Medicine

## 2015-04-18 ENCOUNTER — Other Ambulatory Visit: Payer: Self-pay | Admitting: Family Medicine

## 2015-04-18 NOTE — Telephone Encounter (Signed)
Patient requesting refill. 

## 2015-04-21 ENCOUNTER — Other Ambulatory Visit: Payer: Self-pay | Admitting: Family Medicine

## 2015-05-08 ENCOUNTER — Encounter: Payer: Self-pay | Admitting: Family Medicine

## 2015-05-08 ENCOUNTER — Ambulatory Visit (INDEPENDENT_AMBULATORY_CARE_PROVIDER_SITE_OTHER): Payer: Medicare Other | Admitting: Family Medicine

## 2015-05-08 VITALS — BP 126/64 | HR 94 | Temp 98.2°F | Resp 16 | Ht 65.0 in | Wt 146.8 lb

## 2015-05-08 DIAGNOSIS — G547 Phantom limb syndrome without pain: Secondary | ICD-10-CM | POA: Diagnosis not present

## 2015-05-08 DIAGNOSIS — F330 Major depressive disorder, recurrent, mild: Secondary | ICD-10-CM | POA: Diagnosis not present

## 2015-05-08 DIAGNOSIS — E1121 Type 2 diabetes mellitus with diabetic nephropathy: Secondary | ICD-10-CM

## 2015-05-08 DIAGNOSIS — I10 Essential (primary) hypertension: Secondary | ICD-10-CM | POA: Diagnosis not present

## 2015-05-08 DIAGNOSIS — E1151 Type 2 diabetes mellitus with diabetic peripheral angiopathy without gangrene: Secondary | ICD-10-CM | POA: Diagnosis not present

## 2015-05-08 DIAGNOSIS — E1142 Type 2 diabetes mellitus with diabetic polyneuropathy: Secondary | ICD-10-CM

## 2015-05-08 DIAGNOSIS — E785 Hyperlipidemia, unspecified: Secondary | ICD-10-CM

## 2015-05-08 DIAGNOSIS — I739 Peripheral vascular disease, unspecified: Secondary | ICD-10-CM

## 2015-05-08 DIAGNOSIS — Z79899 Other long term (current) drug therapy: Secondary | ICD-10-CM | POA: Diagnosis not present

## 2015-05-08 DIAGNOSIS — Z23 Encounter for immunization: Secondary | ICD-10-CM | POA: Diagnosis not present

## 2015-05-08 LAB — POCT UA - MICROALBUMIN: Microalbumin Ur, POC: 20 mg/L

## 2015-05-08 LAB — POCT GLYCOSYLATED HEMOGLOBIN (HGB A1C): Hemoglobin A1C: 7.4

## 2015-05-08 MED ORDER — LOSARTAN POTASSIUM-HCTZ 50-12.5 MG PO TABS: 1.0000 | ORAL_TABLET | Freq: Every day | ORAL | Status: DC

## 2015-05-08 MED ORDER — CITALOPRAM HYDROBROMIDE 20 MG PO TABS: 20.0000 mg | ORAL_TABLET | Freq: Every day | ORAL | Status: DC

## 2015-05-08 MED ORDER — ROSUVASTATIN CALCIUM 10 MG PO TABS: 10.0000 mg | ORAL_TABLET | Freq: Every day | ORAL | Status: DC

## 2015-05-08 MED ORDER — GLIPIZIDE ER 5 MG PO TB24: 5.0000 mg | ORAL_TABLET | Freq: Every day | ORAL | Status: DC

## 2015-05-08 MED ORDER — METFORMIN HCL ER (OSM) 1000 MG PO TB24: ORAL_TABLET | ORAL | Status: DC

## 2015-05-08 NOTE — Progress Notes (Signed)
Name: Lisa [name]   MRN: [id number]    DOB: 05/01/1946   Date:05/08/2015       Progress Note  Subjective  Chief Complaint  Chief Complaint  Patient presents with  . Medication Refill    3 month F/U  . Diabetes    Checks once a day to twice a day, Average-119 High-150  . Hypertension  . Hyperlipidemia  . Depression    Controlling symptoms, can tell a big difference if she misses it.     HPI    DMII: last hgbA1C was 12.7, but she states since she started on Citalopram she has been less stressed out her glucose has been under better control, average of 119 and high of 150. She very seldom has sensation of hypoglycemia but she always has a peppermint in her person.   HTN: taking medications, denies side effects. No chest pain or palpitation   Major Depression: she  Is  doing well on Citalopram, being more independent, getting out the house more, no crying spells, no suicidal thoughts or ideation. She states when she skips a dose she noticed difficulty focusing.  Vitamin B12 and Vitamin D deficiency: she stopped taking supplementations on her own  Peripheral neuropathy and phanton pain: she states pain is continuous , but better over the past month, right now it is a 5/10 like a burning sensation . She does not want to take medications for the pain every day, she takes prn Tylenol  PVD: she is due for follow up with Dr. Lucky Cowboy, she is on Plavix, s/p amputation.    Patient Active Problem List   Diagnosis Date Noted  . Amputated toe ([place]) 12/02/2014  . Benign essential HTN 12/02/2014  . Depression, major, recurrent, mild ([place]) 12/02/2014  . Dyslipidemia 12/02/2014  . Vitamin B12 deficiency 12/02/2014  . Peripheral neuropathic pain ([place]) 12/02/2014  . Peripheral artery disease ([place]) 12/02/2014  . Phantom limb ([place]) 12/02/2014  . Diabetes mellitus with nephropathy ([place]) 12/02/2014  . Detached retina 12/02/2014  . Type 2 diabetes mellitus with peripheral neuropathy ([place]) 12/02/2014   . Tobacco use 12/02/2014  . Urge incontinence 12/02/2014  . 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  . Vitamin D deficiency 12/02/2014    Past Surgical History  Procedure Laterality Date  . Abdominal hysterectomy    . Eye surgery    . Vascular surgery    . Toe amputation Left 2011    all toes    Family History  Problem Relation Age of Onset  . Diabetes Mother   . Diabetes Sister   . Diabetes Sister     Social History   Social History  . Marital Status: Widowed    Spouse Name: N/A  . Number of Children: N/A  . Years of Education: N/A   Occupational History  . Not on file.   Social History Main Topics  . Smoking status: Current Every Day Smoker -- 0.50 packs/day for 18 years  . Smokeless tobacco: Never Used  . Alcohol Use: No  . Drug Use: No  . Sexual Activity: Not Currently   Other Topics Concern  . Not on file   Social History Narrative     Current outpatient prescriptions:  .  aspirin 81 MG tablet, Take 81 mg by mouth daily., Disp: , Rfl:  .  atropine 1 % ophthalmic ointment, Place 1 application into the right eye 3 (three) times daily., Disp: , Rfl:  .  blood glucose meter kit and supplies, , Disp: , Rfl:  .  Cholecalciferol (VITAMIN D) 2000 UNITS tablet, Take 1 tablet by mouth daily., Disp: , Rfl:  .  citalopram (CELEXA) 20 MG tablet, Take 1 tablet (20 mg total) by mouth daily., Disp: 90 tablet, Rfl: 1 .  clopidogrel (PLAVIX) 75 MG tablet, TAKE 1 TABLET (75 MG TOTAL) BY MOUTH DAILY., Disp: 30 tablet, Rfl: 3 .  dorzolamide-timolol (COSOPT) 22.3-6.8 MG/ML ophthalmic solution, Place 1 drop into both eyes 2 (two) times daily., Disp: , Rfl: 6 .  glipiZIDE (GLIPIZIDE XL) 5 MG 24 hr tablet, Take 1 tablet (5 mg total) by mouth daily with breakfast., Disp: 90 tablet, Rfl: 1 .  losartan-hydrochlorothiazide (HYZAAR) 50-12.5 MG tablet, Take 1 tablet by mouth daily., Disp: 90 tablet, Rfl: 1 .  metformin (FORTAMET) 1000 MG (OSM) 24 hr tablet, One daily, Disp: 90 tablet, Rfl: 1 .   Methylcobalamin (B12-ACTIVE) 1 MG CHEW, Chew 2 tablets by mouth daily., Disp: , Rfl:  .  Multiple Vitamin (MULTIVITAMIN) tablet, Take 1 tablet by mouth daily., Disp: , Rfl:  .  prednisoLONE sodium phosphate (INFLAMASE FORTE) 1 % ophthalmic solution, Place 1 drop into the right eye 4 (four) times daily as needed., Disp: , Rfl:  .  rosuvastatin (CRESTOR) 10 MG tablet, Take 1 tablet (10 mg total) by mouth daily., Disp: 90 tablet, Rfl: 1 .  timolol (BETIMOL) 0.5 % ophthalmic solution, 1 drop., Disp: , Rfl:   Allergies  Allergen Reactions  . Other Itching    Nucinta     ROS  Constitutional: Negative for fever or weight change.  Respiratory: Negative for cough and shortness of breath.   Cardiovascular: Negative for chest pain or palpitations.  Gastrointestinal: Negative for abdominal pain, no bowel changes.  Musculoskeletal: Positive for  gait problem - chronic since amputation of toe, or joint swelling.  Skin: Negative for rash. Itchy back she states she uses lotion and it works well for her Neurological: Negative for dizziness or headache.  No other specific complaints in a complete review of systems (except as listed in HPI above).  Objective  Filed Vitals:   05/08/15 1419  BP: 126/64  Pulse: 94  Temp: 98.2 F (36.8 C)  TempSrc: Oral  Resp: 16  Height: 5\' 5"  (1.651 m)  Weight: 146 lb 12.8 oz (66.588 kg)  SpO2: 98%    Body mass index is 24.43 kg/(m^2).  Physical Exam  Constitutional: Patient appears well-developed and well-nourished.  No distress.  HEENT: head atraumatic, normocephalic, wears shades - legally blind right eyet,neck supple, throat within normal limits Cardiovascular: Normal rate, regular rhythm and normal heart sounds.  No murmur heard. No BLE edema. Pulmonary/Chest: Effort normal and breath sounds normal. No respiratory distress. Abdominal: Soft.  There is no tenderness. Psychiatric: Patient has a normal mood and affect. behavior is normal. Judgment and  thought content normal.  Recent Results (from the past 2160 hour(s))  POCT UA - Microalbumin     Status: None   Collection Time: 05/08/15  2:21 PM  Result Value Ref Range   Microalbumin Ur, POC 20 mg/L   Creatinine, POC  mg/dL   Albumin/Creatinine Ratio, Urine, POC    POCT HgB A1C     Status: Abnormal   Collection Time: 05/08/15  2:22 PM  Result Value Ref Range   Hemoglobin A1C 7.4     PHQ2/9: Depression screen [facility] 2/9 05/08/2015 12/05/2014  Decreased Interest 0 0  Down, Depressed, Hopeless 0 0  PHQ - 2 Score 0 0     Fall Risk: Fall Risk  05/08/2015 12/05/2014  Falls in the past year? No Yes  Number falls in past yr: - 2 or more  Injury with Fall? - No     Functional Status Survey: Is the patient deaf or have difficulty hearing?: No Does the patient have difficulty seeing, even when wearing glasses/contacts?: Yes (wears glasses-almost blind in right eye) Does the patient have difficulty concentrating, remembering, or making decisions?: No Does the patient have difficulty walking or climbing stairs?: No Does the patient have difficulty dressing or bathing?: No Does the patient have difficulty doing errands alone such as visiting a doctor's office or shopping?: No    Assessment & Plan  1. Type 2 diabetes mellitus with peripheral neuropathy (HCC)  Doing well, at goal for her - POCT HgB A1C - POCT UA - Microalbumin - glipiZIDE (GLIPIZIDE XL) 5 MG 24 hr tablet; Take 1 tablet (5 mg total) by mouth daily with breakfast.  Dispense: 90 tablet; Refill: 1 - metformin (FORTAMET) 1000 MG (OSM) 24 hr tablet; One daily  Dispense: 90 tablet; Refill: 1   2. Benign essential HTN  At goal  - losartan-hydrochlorothiazide (HYZAAR) 50-12.5 MG tablet; Take 1 tablet by mouth daily.  Dispense: 90 tablet; Refill: 1  3. Depression, major, recurrent, mild (HCC)  Doing well  - citalopram (CELEXA) 20 MG tablet; Take 1 tablet (20 mg total) by mouth daily.  Dispense: 90 tablet; Refill:  1  4. Dyslipidemia  - rosuvastatin (CRESTOR) 10 MG tablet; Take 1 tablet (10 mg total) by mouth daily.  Dispense: 90 tablet; Refill: 1 - Lipid panel  5. Diabetes mellitus with nephropathy (HCC)  - Estimated GFR  6. Phantom limb ([place])  Taking prn Tylenol   7. Peripheral artery disease ([place])  - Ambulatory referral to Vascular Surgery  8. DM (diabetes mellitus), type 2 with peripheral vascular complications (HCC)  - glipiZIDE (GLIPIZIDE XL) 5 MG 24 hr tablet; Take 1 tablet (5 mg total) by mouth daily with breakfast.  Dispense: 90 tablet; Refill: 1 - metformin (FORTAMET) 1000 MG (OSM) 24 hr tablet; One daily  Dispense: 90 tablet; Refill: 1  9. Needs flu shot  - Flu vaccine HIGH DOSE PF  10. Need for pneumococcal vaccination  - Pneumococcal conjugate vaccine 13-valent IM  11. Long-term use of high-risk medication  - AST - ALT

## 2015-05-18 ENCOUNTER — Other Ambulatory Visit: Payer: Self-pay | Admitting: Family Medicine

## 2015-05-18 NOTE — Telephone Encounter (Signed)
Patient requesting refill. 

## 2015-06-17 ENCOUNTER — Other Ambulatory Visit: Payer: Self-pay | Admitting: Family Medicine

## 2015-06-21 DIAGNOSIS — H401133 Primary open-angle glaucoma, bilateral, severe stage: Secondary | ICD-10-CM | POA: Diagnosis not present

## 2015-06-28 DIAGNOSIS — H401113 Primary open-angle glaucoma, right eye, severe stage: Secondary | ICD-10-CM | POA: Diagnosis not present

## 2015-07-20 DIAGNOSIS — E119 Type 2 diabetes mellitus without complications: Secondary | ICD-10-CM | POA: Diagnosis not present

## 2015-07-20 DIAGNOSIS — I739 Peripheral vascular disease, unspecified: Secondary | ICD-10-CM | POA: Diagnosis not present

## 2015-07-27 DIAGNOSIS — H1031 Unspecified acute conjunctivitis, right eye: Secondary | ICD-10-CM | POA: Diagnosis not present

## 2015-08-20 ENCOUNTER — Other Ambulatory Visit: Payer: Self-pay | Admitting: Family Medicine

## 2015-09-11 ENCOUNTER — Ambulatory Visit: Payer: Medicare Other | Admitting: Family Medicine

## 2015-09-25 ENCOUNTER — Ambulatory Visit (INDEPENDENT_AMBULATORY_CARE_PROVIDER_SITE_OTHER): Payer: Medicare Other | Admitting: Family Medicine

## 2015-09-25 ENCOUNTER — Encounter: Payer: Self-pay | Admitting: Family Medicine

## 2015-09-25 VITALS — BP 118/70 | HR 82 | Temp 98.8°F | Resp 16 | Ht 65.0 in | Wt 152.9 lb

## 2015-09-25 DIAGNOSIS — G629 Polyneuropathy, unspecified: Secondary | ICD-10-CM | POA: Diagnosis not present

## 2015-09-25 DIAGNOSIS — Z89422 Acquired absence of other left toe(s): Secondary | ICD-10-CM

## 2015-09-25 DIAGNOSIS — E1121 Type 2 diabetes mellitus with diabetic nephropathy: Secondary | ICD-10-CM

## 2015-09-25 DIAGNOSIS — E1151 Type 2 diabetes mellitus with diabetic peripheral angiopathy without gangrene: Secondary | ICD-10-CM | POA: Diagnosis not present

## 2015-09-25 DIAGNOSIS — I10 Essential (primary) hypertension: Secondary | ICD-10-CM

## 2015-09-25 DIAGNOSIS — I739 Peripheral vascular disease, unspecified: Secondary | ICD-10-CM | POA: Diagnosis not present

## 2015-09-25 DIAGNOSIS — G547 Phantom limb syndrome without pain: Secondary | ICD-10-CM | POA: Diagnosis not present

## 2015-09-25 DIAGNOSIS — S98132A Complete traumatic amputation of one left lesser toe, initial encounter: Secondary | ICD-10-CM

## 2015-09-25 DIAGNOSIS — Z1239 Encounter for other screening for malignant neoplasm of breast: Secondary | ICD-10-CM | POA: Diagnosis not present

## 2015-09-25 DIAGNOSIS — F330 Major depressive disorder, recurrent, mild: Secondary | ICD-10-CM | POA: Diagnosis not present

## 2015-09-25 DIAGNOSIS — E559 Vitamin D deficiency, unspecified: Secondary | ICD-10-CM | POA: Diagnosis not present

## 2015-09-25 DIAGNOSIS — E785 Hyperlipidemia, unspecified: Secondary | ICD-10-CM | POA: Diagnosis not present

## 2015-09-25 DIAGNOSIS — M792 Neuralgia and neuritis, unspecified: Secondary | ICD-10-CM

## 2015-09-25 MED ORDER — GLIPIZIDE ER 5 MG PO TB24: 5.0000 mg | ORAL_TABLET | Freq: Every day | ORAL | Status: DC

## 2015-09-25 MED ORDER — METFORMIN HCL ER (OSM) 1000 MG PO TB24: 1000.0000 mg | ORAL_TABLET | Freq: Every day | ORAL | Status: DC

## 2015-09-25 MED ORDER — LOSARTAN POTASSIUM 25 MG PO TABS: 25.0000 mg | ORAL_TABLET | Freq: Every day | ORAL | Status: DC

## 2015-09-25 MED ORDER — CITALOPRAM HYDROBROMIDE 20 MG PO TABS: 20.0000 mg | ORAL_TABLET | Freq: Every day | ORAL | Status: DC

## 2015-09-25 MED ORDER — CLOPIDOGREL BISULFATE 75 MG PO TABS: 75.0000 mg | ORAL_TABLET | Freq: Once | ORAL | Status: DC

## 2015-09-25 MED ORDER — ROSUVASTATIN CALCIUM 10 MG PO TABS: 10.0000 mg | ORAL_TABLET | Freq: Every day | ORAL | Status: DC

## 2015-09-25 NOTE — Progress Notes (Signed)
Name: Lisa [name]   MRN: [id number]    DOB: [birth year]   Date:09/25/2015       Progress Note  Subjective  Chief Complaint  Chief Complaint  Patient presents with  . Diabetes    patient is here for her 55-monthf/u. check her blood sugar most days. she stated it was doing fine.  . Hypertension  . Depression  . dyslipidemia  . Medication Refill  . Numbness    patient stated that she has been having some left sided numbness in her hand.    HPI  DMII: last hgbA1C was 7.4 %, taking Fortamet 1000 mg daily instead of two daily and not sure if taking Glipizide as prescribed, hgbA1C is above 8 now. She states she has been eating more sweets lately.  She very seldom has sensation of hypoglycemia but she always has a peppermint in her person. Taking ARB for kidney protection  HTN: taking medications, denies side effects. No chest pain or palpitation . BP is low, she is only taking half of losartan / hctz. So we will change to 25 mg of losartan  Major Depression: she Is doing well on Citalopram, being more independent, getting out the house more, no crying spells, no suicidal thoughts or ideation. She states when she skips a dose she noticed difficulty focusing.She recently celebrated her 89th birthday and felt loved.   Vitamin B12 and Vitamin D deficiency: she is back taking supplementations   Peripheral neuropathy and phanton pain: she states pain is continuous , but better over the past month, right now it is a 6/10 like a burning sensation . She does not want to take medications for the pain every day, she takes prn Tylenol  PVD: she is due for follow up with Dr. DLucky Cowboy she is on Plavix, s/p amputation. She does not take the Ultram but sometimes the pain can be intense on her left foot.    Patient Active Problem List   Diagnosis Date Noted  . DM (diabetes mellitus), type 2 with peripheral vascular complications ([place] 012/45/8099 . Amputated toe ([place] 12/02/2014  . Benign essential HTN  12/02/2014  . Depression, major, recurrent, mild ([place] 12/02/2014  . Dyslipidemia 12/02/2014  . Vitamin B12 deficiency 12/02/2014  . Peripheral neuropathic pain ([place] 12/02/2014  . Peripheral artery disease ([place] 12/02/2014  . Phantom limb ([facility] 12/02/2014  . Diabetes mellitus with nephropathy ([place] 12/02/2014  . Detached retina 12/02/2014  . Type 2 diabetes mellitus with peripheral neuropathy ([facility] 12/02/2014  . Tobacco use 12/02/2014  . Urge incontinence 12/02/2014  . 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  . Vitamin D deficiency 12/02/2014    Past Surgical History  Procedure Laterality Date  . Abdominal hysterectomy    . Eye surgery    . Vascular surgery    . Toe amputation Left 2011    all toes    Family History  Problem Relation Age of Onset  . Diabetes Mother   . Diabetes Sister   . Diabetes Sister     Social History   Social History  . Marital Status: Widowed    Spouse Name: N/A  . Number of Children: N/A  . Years of Education: N/A   Occupational History  . Not on file.   Social History Main Topics  . Smoking status: Current Every Day Smoker -- 0.50 packs/day for 18 years  . Smokeless tobacco: Never Used  . Alcohol Use: No  . Drug Use: No  . Sexual Activity: Not Currently  Other Topics Concern  . Not on file   Social History Narrative     Current outpatient prescriptions:  .  aspirin 81 MG tablet, Take 81 mg by mouth daily., Disp: , Rfl:  .  atropine 1 % ophthalmic ointment, Place 1 application into the right eye 3 (three) times daily., Disp: , Rfl:  .  blood glucose meter kit and supplies, , Disp: , Rfl:  .  Cholecalciferol (VITAMIN D) 2000 UNITS tablet, Take 1 tablet by mouth daily., Disp: , Rfl:  .  citalopram (CELEXA) 20 MG tablet, Take 1 tablet (20 mg total) by mouth daily., Disp: 90 tablet, Rfl: 1 .  clopidogrel (PLAVIX) 75 MG tablet, TAKE 1 TABLET (75 MG TOTAL) BY MOUTH DAILY., Disp: 30 tablet, Rfl: 3 .  dorzolamide-timolol (COSOPT) 22.3-6.8 MG/ML  ophthalmic solution, Place 1 drop into both eyes 2 (two) times daily., Disp: , Rfl: 6 .  glipiZIDE (GLIPIZIDE XL) 5 MG 24 hr tablet, Take 1 tablet (5 mg total) by mouth daily with breakfast., Disp: 90 tablet, Rfl: 1 .  metformin (FORTAMET) 1000 MG (OSM) 24 hr tablet, TAKE 1 TABLET BY MOUTH EVERY DAY WITH BREAKFAST (INS COVERS 60 NOT 90), Disp: 90 tablet, Rfl: 0 .  Methylcobalamin (B12-ACTIVE) 1 MG CHEW, Chew 2 tablets by mouth daily., Disp: , Rfl:  .  Multiple Vitamin (MULTIVITAMIN) tablet, Take 1 tablet by mouth daily., Disp: , Rfl:  .  ofloxacin (OCUFLOX) 0.3 % ophthalmic solution, USE 1 DROP IN RIGHT EYE 4 TIMES A DAY FOR 7 DAYS, Disp: , Rfl: 0 .  prednisoLONE acetate (PRED FORTE) 1 % ophthalmic suspension, USE 1 DROP IN RIGHT EYE 4 TIMES A DAY, Disp: , Rfl: 3 .  prednisoLONE sodium phosphate (INFLAMASE FORTE) 1 % ophthalmic solution, Place 1 drop into the right eye 4 (four) times daily as needed., Disp: , Rfl:  .  rosuvastatin (CRESTOR) 10 MG tablet, Take 1 tablet (10 mg total) by mouth daily., Disp: 90 tablet, Rfl: 1 .  timolol (BETIMOL) 0.5 % ophthalmic solution, 1 drop., Disp: , Rfl:   Allergies  Allergen Reactions  . Other Itching    Nucinta     ROS  Constitutional: Negative for fever , positive  weight change.  Respiratory: Negative for cough and shortness of breath.   Cardiovascular: Negative for chest pain or palpitations.  Gastrointestinal: Negative for abdominal pain, no bowel changes.  Musculoskeletal: Negative for gait problem or joint swelling.  Skin: Negative for rash.  Neurological: Negative for dizziness or headache.  No other specific complaints in a complete review of systems (except as listed in HPI above).  Objective  Filed Vitals:   09/25/15 1515  BP: 118/70  Pulse: 82  Temp: 98.8 F (37.1 C)  TempSrc: Oral  Resp: 16  Height: 5' 5" (1.651 m)  Weight: 152 lb 14.4 oz (69.355 kg)  SpO2: 98%    Body mass index is 25.44 kg/(m^2).  Physical  Exam  Constitutional: Patient appears well-developed and well-nourished. Obese  No distress.  HEENT: head atraumatic, normocephalic, pupils equal and reactive to light, neck supple, throat within normal limits Cardiovascular: Normal rate, regular rhythm and normal heart sounds.  No murmur heard. No BLE edema. Pulmonary/Chest: Effort normal and breath sounds normal. No respiratory distress. Abdominal: Soft.  There is no tenderness. Psychiatric: Patient has a normal mood and affect. behavior is normal. Judgment and thought content normal.    PHQ2/9: Depression screen [facility] 2/9 09/25/2015 05/08/2015 12/05/2014  Decreased Interest 0 0 0  Down, Depressed, Hopeless 0 0 0  PHQ - 2 Score 0 0 0    Fall Risk: Fall Risk  09/25/2015 05/08/2015 12/05/2014  Falls in the past year? No No Yes  Number falls in past yr: - - 2 or more  Injury with Fall? - - No     Functional Status Survey: Is the patient deaf or have difficulty hearing?: No Does the patient have difficulty seeing, even when wearing glasses/contacts?: Yes Does the patient have difficulty concentrating, remembering, or making decisions?: No Does the patient have difficulty walking or climbing stairs?: Yes Does the patient have difficulty dressing or bathing?: No Does the patient have difficulty doing errands alone such as visiting a doctor's office or shopping?: No   Assessment & Plan  1. Diabetes mellitus with nephropathy ([place])  She will resume diet, she does not want to change medication at this time - POCT glycosylated hemoglobin (Hb A1C) - metformin (FORTAMET) 1000 MG (OSM) 24 hr tablet; Take 1 tablet (1,000 mg total) by mouth daily with breakfast.  Dispense: 90 tablet; Refill: 1 - Comprehensive metabolic panel  2. Benign essential HTN  - Comprehensive metabolic panel - losartan (COZAAR) 25 MG tablet; Take 1 tablet (25 mg total) by mouth daily.  Dispense: 90 tablet; Refill: 1  3. Depression, major, recurrent, mild (HCC)  -  citalopram (CELEXA) 20 MG tablet; Take 1 tablet (20 mg total) by mouth daily.  Dispense: 90 tablet; Refill: 1  4. Dyslipidemia  - rosuvastatin (CRESTOR) 10 MG tablet; Take 1 tablet (10 mg total) by mouth daily.  Dispense: 90 tablet; Refill: 1 - Lipid panel  5. Peripheral artery disease ([place])  Continue follow up with Dr. Lucky Cowboy  6. Phantom limb ([place])  Gets worse at times, but does not want medication for it  7. Peripheral neuropathic pain (HCC)  - clopidogrel (PLAVIX) 75 MG tablet; Take 1 tablet (75 mg total) by mouth once.  Dispense: 90 tablet; Refill: 1  8. Amputated toe, left ([place])  She does not want to go back on Gabapentin  9. DM (diabetes mellitus), type 2 with peripheral vascular complications (HCC)  - glipiZIDE (GLIPIZIDE XL) 5 MG 24 hr tablet; Take 1 tablet (5 mg total) by mouth daily with breakfast.  Dispense: 90 tablet; Refill: 1 - metformin (FORTAMET) 1000 MG (OSM) 24 hr tablet; Take 1 tablet (1,000 mg total) by mouth daily with breakfast.  Dispense: 90 tablet; Refill: 1  10. Vitamin D deficiency  Continue supplementation  - VITAMIN D 25 Hydroxy (Vit-D Deficiency, Fractures)  11. Encounter for breast cancer screening other than mammogram  - MM Digital Screening; Future

## 2015-09-26 ENCOUNTER — Other Ambulatory Visit: Payer: Self-pay | Admitting: Family Medicine

## 2015-09-26 LAB — COMPREHENSIVE METABOLIC PANEL
A/G RATIO: 1.8 (ref 1.2–2.2)
ALT: 7 IU/L (ref 0–32)
AST: 16 IU/L (ref 0–40)
Albumin: 4.4 g/dL (ref 3.5–4.8)
Alkaline Phosphatase: 75 IU/L (ref 39–117)
BUN/Creatinine Ratio: 10 — ABNORMAL LOW (ref 12–28)
BUN: 8 mg/dL (ref 8–27)
Bilirubin Total: 0.4 mg/dL (ref 0.0–1.2)
CALCIUM: 9.9 mg/dL (ref 8.7–10.3)
CHLORIDE: 102 mmol/L (ref 96–106)
CO2: 25 mmol/L (ref 18–29)
Creatinine, Ser: 0.78 mg/dL (ref 0.57–1.00)
GFR calc Af Amer: 89 mL/min/{1.73_m2} (ref >59)
GFR, EST NON AFRICAN AMERICAN: 77 mL/min/{1.73_m2} (ref >59)
GLUCOSE: 89 mg/dL (ref 65–99)
Globulin, Total: 2.4 g/dL (ref 1.5–4.5)
POTASSIUM: 4.3 mmol/L (ref 3.5–5.2)
Sodium: 142 mmol/L (ref 134–144)
Total Protein: 6.8 g/dL (ref 6.0–8.5)

## 2015-09-26 LAB — LIPID PANEL
CHOL/HDL RATIO: 2.2 ratio (ref 0.0–4.4)
Cholesterol, Total: 143 mg/dL (ref 100–199)
HDL: 64 mg/dL (ref >39)
LDL CALC: 61 mg/dL (ref 0–99)
TRIGLYCERIDES: 89 mg/dL (ref 0–149)
VLDL Cholesterol Cal: 18 mg/dL (ref 5–40)

## 2015-09-26 LAB — VITAMIN D 25 HYDROXY (VIT D DEFICIENCY, FRACTURES): Vit D, 25-Hydroxy: 16.4 ng/mL — ABNORMAL LOW (ref 30.0–100.0)

## 2015-09-26 MED ORDER — VITAMIN D (ERGOCALCIFEROL) 1.25 MG (50000 UNIT) PO CAPS: 50000.0000 [IU] | ORAL_CAPSULE | ORAL | Status: DC

## 2015-09-27 ENCOUNTER — Telehealth: Payer: Self-pay

## 2015-09-27 NOTE — Telephone Encounter (Signed)
Patient returned my call and labs were reviewed. 

## 2015-10-15 DIAGNOSIS — H401121 Primary open-angle glaucoma, left eye, mild stage: Secondary | ICD-10-CM | POA: Diagnosis not present

## 2015-10-23 DIAGNOSIS — I739 Peripheral vascular disease, unspecified: Secondary | ICD-10-CM | POA: Diagnosis not present

## 2015-10-23 DIAGNOSIS — F17200 Nicotine dependence, unspecified, uncomplicated: Secondary | ICD-10-CM | POA: Diagnosis not present

## 2015-10-23 DIAGNOSIS — E785 Hyperlipidemia, unspecified: Secondary | ICD-10-CM | POA: Diagnosis not present

## 2015-10-23 DIAGNOSIS — E119 Type 2 diabetes mellitus without complications: Secondary | ICD-10-CM | POA: Diagnosis not present

## 2015-11-04 ENCOUNTER — Other Ambulatory Visit: Payer: Self-pay | Admitting: Family Medicine

## 2015-11-05 NOTE — Telephone Encounter (Signed)
Patient requesting refill. 

## 2016-01-22 DIAGNOSIS — H401121 Primary open-angle glaucoma, left eye, mild stage: Secondary | ICD-10-CM | POA: Diagnosis not present

## 2016-01-29 ENCOUNTER — Encounter: Payer: Self-pay | Admitting: Family Medicine

## 2016-01-29 ENCOUNTER — Ambulatory Visit (INDEPENDENT_AMBULATORY_CARE_PROVIDER_SITE_OTHER): Payer: Medicare Other | Admitting: Family Medicine

## 2016-01-29 VITALS — BP 160/82 | HR 69 | Temp 98.0°F | Resp 16 | Ht 65.0 in | Wt 151.8 lb

## 2016-01-29 DIAGNOSIS — E1151 Type 2 diabetes mellitus with diabetic peripheral angiopathy without gangrene: Secondary | ICD-10-CM

## 2016-01-29 DIAGNOSIS — G629 Polyneuropathy, unspecified: Secondary | ICD-10-CM | POA: Diagnosis not present

## 2016-01-29 DIAGNOSIS — E559 Vitamin D deficiency, unspecified: Secondary | ICD-10-CM | POA: Diagnosis not present

## 2016-01-29 DIAGNOSIS — Z89422 Acquired absence of other left toe(s): Secondary | ICD-10-CM

## 2016-01-29 DIAGNOSIS — E1139 Type 2 diabetes mellitus with other diabetic ophthalmic complication: Secondary | ICD-10-CM | POA: Insufficient documentation

## 2016-01-29 DIAGNOSIS — E1165 Type 2 diabetes mellitus with hyperglycemia: Secondary | ICD-10-CM | POA: Diagnosis not present

## 2016-01-29 DIAGNOSIS — M792 Neuralgia and neuritis, unspecified: Secondary | ICD-10-CM

## 2016-01-29 DIAGNOSIS — IMO0002 Reserved for concepts with insufficient information to code with codable children: Secondary | ICD-10-CM | POA: Insufficient documentation

## 2016-01-29 DIAGNOSIS — G547 Phantom limb syndrome without pain: Secondary | ICD-10-CM | POA: Diagnosis not present

## 2016-01-29 DIAGNOSIS — E785 Hyperlipidemia, unspecified: Secondary | ICD-10-CM

## 2016-01-29 DIAGNOSIS — S98132A Complete traumatic amputation of one left lesser toe, initial encounter: Secondary | ICD-10-CM

## 2016-01-29 DIAGNOSIS — F330 Major depressive disorder, recurrent, mild: Secondary | ICD-10-CM | POA: Diagnosis not present

## 2016-01-29 DIAGNOSIS — I10 Essential (primary) hypertension: Secondary | ICD-10-CM | POA: Diagnosis not present

## 2016-01-29 DIAGNOSIS — I739 Peripheral vascular disease, unspecified: Secondary | ICD-10-CM

## 2016-01-29 DIAGNOSIS — Z1231 Encounter for screening mammogram for malignant neoplasm of breast: Secondary | ICD-10-CM

## 2016-01-29 LAB — POCT GLYCOSYLATED HEMOGLOBIN (HGB A1C): HEMOGLOBIN A1C: 7.2

## 2016-01-29 MED ORDER — GLIPIZIDE ER 5 MG PO TB24: 5.0000 mg | ORAL_TABLET | Freq: Every day | ORAL | 1 refills | Status: DC

## 2016-01-29 MED ORDER — ROSUVASTATIN CALCIUM 10 MG PO TABS: 10.0000 mg | ORAL_TABLET | Freq: Every day | ORAL | 1 refills | Status: DC

## 2016-01-29 MED ORDER — METFORMIN HCL ER (OSM) 1000 MG PO TB24
1000.0000 mg | ORAL_TABLET | Freq: Every day | ORAL | 0 refills | Status: DC
Start: 2016-01-29 — End: 2016-03-25

## 2016-01-29 MED ORDER — CITALOPRAM HYDROBROMIDE 20 MG PO TABS: 20.0000 mg | ORAL_TABLET | Freq: Every day | ORAL | 1 refills | Status: DC

## 2016-01-29 MED ORDER — CLOPIDOGREL BISULFATE 75 MG PO TABS: 75.0000 mg | ORAL_TABLET | Freq: Once | ORAL | 1 refills | Status: DC

## 2016-01-29 MED ORDER — LOSARTAN POTASSIUM 25 MG PO TABS: 25.0000 mg | ORAL_TABLET | Freq: Every day | ORAL | 1 refills | Status: DC

## 2016-01-29 NOTE — Progress Notes (Signed)
Name: Lisa [name]   MRN: [id number]    DOB: April 24, 1946   Date:01/29/2016       Progress Note  Subjective  Chief Complaint  Chief Complaint  Patient presents with  . Medication Refill    4 month F/U  . Diabetes    Patient does not check BS at home but when patient does it has been high. Patient thinks her Metformin is causing her additional pain in her left foot.   . Hypertension    Edema in left ankle due to vascular condition, patient has been experiencing a tiny headache and her eye doctor gave her a new eye drop due to pressure in her eyes.  . Depression    HPI  DMII: last hgbA1C is down to 7.2% doing well, she has noticed increase in phanton pain when she takes metformin but willing to continue medications since it is getting hgbA1C to goal and she is not having side effects.  She very seldom has sensation of hypoglycemia but she always has a peppermint in her person. Taking ARB for kidney protection. She sees vascular surgeon and is on Plavix. She does not want to take medication for phanton syndrome  HTN: taking medications, denies side effects. No chest pain or palpitation . BP is elevated today, but she skipped medications yesterday, she will resume it tonight.   Major Depression in remission she Is doing well on Citalopram, being more independent, getting out the house more, no crying spells, no suicidal thoughts or ideation. She states when she skips a dose she noticed difficulty focusing.  Vitamin B12 and Vitamin D deficiency: she is back taking supplementations   Peripheral neuropathy and phanton pain: she states pain is continuous ,pain is constant 5/10 . She does not want to take medications for the pain every day, she takes prn Tylenol  PVD: she is due for follow up with Dr. Lucky Cowboy, she is on Plavix, s/p amputation. She does not take the Ultram but sometimes the pain can be intense on her left foot.   Dyslipidemia: taking Crestor and denies myalgia.  Blind right  eye: sees ophthalmologist   Patient Active Problem List   Diagnosis Date Noted  . Uncontrolled type 2 diabetes with eye complications ([place]) 60/73/7106  . DM (diabetes mellitus), type 2 with peripheral vascular complications ([place]) 26/94/8546  . Amputated toe ([place]) 12/02/2014  . Benign essential HTN 12/02/2014  . Depression, major, recurrent, mild ([place]) 12/02/2014  . Dyslipidemia 12/02/2014  . Vitamin B12 deficiency 12/02/2014  . Peripheral neuropathic pain ([place]) 12/02/2014  . Peripheral artery disease ([place]) 12/02/2014  . Phantom limb ([place]) 12/02/2014  . Diabetes mellitus with nephropathy ([place]) 12/02/2014  . Detached retina 12/02/2014  . Type 2 diabetes mellitus with peripheral neuropathy ([place]) 12/02/2014  . Tobacco use 12/02/2014  . Urge incontinence 12/02/2014  . 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  . Vitamin D deficiency 12/02/2014    Past Surgical History:  Procedure Laterality Date  . ABDOMINAL HYSTERECTOMY    . EYE SURGERY    . TOE AMPUTATION Left 2011   all toes  . VASCULAR SURGERY      Family History  Problem Relation Age of Onset  . Diabetes Mother   . Diabetes Sister   . Diabetes Sister     Social History   Social History  . Marital status: Widowed    Spouse name: N/A  . Number of children: N/A  . Years of education: N/A   Occupational History  . Not on file.  Social History Main Topics  . Smoking status: Current Every Day Smoker    Packs/day: 0.50    Years: 18.00  . Smokeless tobacco: Never Used  . Alcohol use No  . Drug use: No  . Sexual activity: Not Currently   Other Topics Concern  . Not on file   Social History Narrative  . No narrative on file     Current Outpatient Prescriptions:  .  atropine 1 % ophthalmic solution, Place 1 drop into the right eye 3 (three) times daily., Disp: , Rfl:  .  aspirin 81 MG tablet, Take 81 mg by mouth daily., Disp: , Rfl:  .  blood glucose meter kit and supplies, , Disp: , Rfl:  .  Cholecalciferol (VITAMIN D)  2000 UNITS tablet, Take 1 tablet by mouth daily., Disp: , Rfl:  .  citalopram (CELEXA) 20 MG tablet, Take 1 tablet (20 mg total) by mouth daily., Disp: 90 tablet, Rfl: 1 .  clopidogrel (PLAVIX) 75 MG tablet, Take 1 tablet (75 mg total) by mouth once., Disp: 90 tablet, Rfl: 1 .  glipiZIDE (GLIPIZIDE XL) 5 MG 24 hr tablet, Take 1 tablet (5 mg total) by mouth daily with breakfast., Disp: 90 tablet, Rfl: 1 .  losartan (COZAAR) 25 MG tablet, Take 1 tablet (25 mg total) by mouth daily., Disp: 90 tablet, Rfl: 1 .  metformin (FORTAMET) 1000 MG (OSM) 24 hr tablet, Take 1 tablet (1,000 mg total) by mouth daily with breakfast., Disp: 90 tablet, Rfl: 0 .  Methylcobalamin (B12-ACTIVE) 1 MG CHEW, Chew 2 tablets by mouth daily., Disp: , Rfl:  .  Multiple Vitamin (MULTIVITAMIN) tablet, Take 1 tablet by mouth daily., Disp: , Rfl:  .  prednisoLONE acetate (PRED FORTE) 1 % ophthalmic suspension, USE 1 DROP IN RIGHT EYE 4 TIMES A DAY, Disp: , Rfl: 3 .  rosuvastatin (CRESTOR) 10 MG tablet, Take 1 tablet (10 mg total) by mouth daily., Disp: 90 tablet, Rfl: 1 .  timolol (BETIMOL) 0.5 % ophthalmic solution, 1 drop., Disp: , Rfl:   Allergies  Allergen Reactions  . Other Itching    Nucinta     ROS  Constitutional: Negative for fever or weight change.  Respiratory: Negative for cough and shortness of breath.   Cardiovascular: Negative for chest pain or palpitations.  Gastrointestinal: Negative for abdominal pain, no bowel changes.  Musculoskeletal: Negative for gait problem or joint swelling.  Skin: Negative for rash.  Neurological: Negative for dizziness or headache.  No other specific complaints in a complete review of systems (except as listed in HPI above).  Objective  Vitals:   01/29/16 1500  BP: (!) 152/86  Pulse: 69  Resp: 16  Temp: 98 F (36.7 C)  TempSrc: Oral  SpO2: 98%  Weight: 151 lb 12.8 oz (68.9 kg)  Height: _0  (1.651 m)    Body mass index is 25.26 kg/m.  Physical  Exam  Constitutional: Patient appears well-developed and well-nourished. Obese  No distress.  HEENT: head atraumatic, normocephalic, pupils equal and reactive to light, neck supple, throat within normal limits Cardiovascular: Normal rate, regular rhythm and normal heart sounds.  No murmur heard. No BLE edema. Pulmonary/Chest: Effort normal and breath sounds normal. No respiratory distress. Abdominal: Soft.  There is no tenderness. Psychiatric: Patient has a normal mood and affect. behavior is normal. Judgment and thought content normal. Muscular Skeletal: partial amputation left foot ( forefoot )   Recent Results (from the past 2160 hour(s))  POCT HgB A1C  Status: Abnormal   Collection Time: 01/29/16  3:10 PM  Result Value Ref Range   Hemoglobin A1C 7.2     Diabetic Foot Exam: Diabetic Foot Exam - Simple   Simple Foot Form Diabetic Foot exam was performed with the following findings:  Yes 01/29/2016  3:25 PM  Visual Inspection See comments:  Yes Sensation Testing See comments:  Yes Pulse Check See comments:  Yes Comments Weak distal pulses, corn formation on bottom of right foot, sees vascular surgeon, getting orthotics  S/p forefoot amputation left foot       PHQ2/9: Depression screen [facility] 2/9 09/25/2015 05/08/2015 12/05/2014  Decreased Interest 0 0 0  Down, Depressed, Hopeless 0 0 0  PHQ - 2 Score 0 0 0     Fall Risk: Fall Risk  09/25/2015 05/08/2015 12/05/2014  Falls in the past year? No No Yes  Number falls in past yr: - - 2 or more  Injury with Fall? - - No       Assessment & Plan  1. DM (diabetes mellitus), type 2 with peripheral vascular complications (HCC)  - POCT HgB A1C 7.2% - metformin (FORTAMET) 1000 MG (OSM) 24 hr tablet; Take 1 tablet (1,000 mg total) by mouth daily with breakfast.  Dispense: 90 tablet; Refill: 0 - glipiZIDE (GLIPIZIDE XL) 5 MG 24 hr tablet; Take 1 tablet (5 mg total) by mouth daily with breakfast.  Dispense: 90 tablet; Refill:  1  2. Benign essential HTN  - losartan (COZAAR) 25 MG tablet; Take 1 tablet (25 mg total) by mouth daily.  Dispense: 90 tablet; Refill: 1  3. Depression, major, recurrent, mild (HCC)  - citalopram (CELEXA) 20 MG tablet; Take 1 tablet (20 mg total) by mouth daily.  Dispense: 90 tablet; Refill: 1  4. Dyslipidemia  - rosuvastatin (CRESTOR) 10 MG tablet; Take 1 tablet (10 mg total) by mouth daily.  Dispense: 90 tablet; Refill: 1  5. Peripheral artery disease ([place])  Continue medication and follow up with vascular surgeon   6. Phantom limb ([place])  Stable, she does not want medication   7. Vitamin D deficiency  Last labs at goal, continue vitamin otc  8. Peripheral neuropathic pain (HCC)  - clopidogrel (PLAVIX) 75 MG tablet; Take 1 tablet (75 mg total) by mouth once.  Dispense: 90 tablet; Refill: 1

## 2016-03-11 DIAGNOSIS — H401121 Primary open-angle glaucoma, left eye, mild stage: Secondary | ICD-10-CM | POA: Diagnosis not present

## 2016-03-25 ENCOUNTER — Other Ambulatory Visit: Payer: Self-pay | Admitting: Family Medicine

## 2016-03-25 NOTE — Telephone Encounter (Signed)
Patient requesting refill of [facility]. 

## 2016-05-10 ENCOUNTER — Other Ambulatory Visit: Payer: Self-pay | Admitting: Family Medicine

## 2016-05-28 ENCOUNTER — Ambulatory Visit: Payer: Medicare Other | Admitting: Family Medicine

## 2016-06-26 DIAGNOSIS — H401121 Primary open-angle glaucoma, left eye, mild stage: Secondary | ICD-10-CM | POA: Diagnosis not present

## 2016-09-08 ENCOUNTER — Telehealth: Payer: Self-pay | Admitting: Family Medicine

## 2016-09-08 NOTE — Telephone Encounter (Signed)
Called Pt to schedule AWV with [facility] - knb °

## 2016-10-10 ENCOUNTER — Other Ambulatory Visit: Payer: Self-pay | Admitting: Family Medicine

## 2016-10-10 NOTE — Telephone Encounter (Signed)
Patient requesting refill of [facility]. 

## 2016-10-14 ENCOUNTER — Encounter: Payer: Self-pay | Admitting: Family Medicine

## 2016-10-14 ENCOUNTER — Ambulatory Visit (INDEPENDENT_AMBULATORY_CARE_PROVIDER_SITE_OTHER): Payer: Medicare Other | Admitting: Family Medicine

## 2016-10-14 VITALS — BP 148/68 | HR 75 | Temp 98.7°F | Resp 16 | Ht 65.0 in | Wt 153.0 lb

## 2016-10-14 DIAGNOSIS — E559 Vitamin D deficiency, unspecified: Secondary | ICD-10-CM

## 2016-10-14 DIAGNOSIS — I739 Peripheral vascular disease, unspecified: Secondary | ICD-10-CM

## 2016-10-14 DIAGNOSIS — G547 Phantom limb syndrome without pain: Secondary | ICD-10-CM | POA: Diagnosis not present

## 2016-10-14 DIAGNOSIS — R198 Other specified symptoms and signs involving the digestive system and abdomen: Secondary | ICD-10-CM | POA: Diagnosis not present

## 2016-10-14 DIAGNOSIS — E785 Hyperlipidemia, unspecified: Secondary | ICD-10-CM

## 2016-10-14 DIAGNOSIS — F330 Major depressive disorder, recurrent, mild: Secondary | ICD-10-CM | POA: Diagnosis not present

## 2016-10-14 DIAGNOSIS — E538 Deficiency of other specified B group vitamins: Secondary | ICD-10-CM

## 2016-10-14 DIAGNOSIS — I10 Essential (primary) hypertension: Secondary | ICD-10-CM

## 2016-10-14 DIAGNOSIS — E1142 Type 2 diabetes mellitus with diabetic polyneuropathy: Secondary | ICD-10-CM | POA: Diagnosis not present

## 2016-10-14 LAB — CBC WITH DIFFERENTIAL/PLATELET
Basophils Absolute: 0 {cells}/uL (ref 0–200)
Basophils Relative: 0 %
Eosinophils Absolute: 58 {cells}/uL (ref 15–500)
Eosinophils Relative: 1 %
HCT: 38.1 % (ref 35.0–45.0)
Hemoglobin: 12.2 g/dL (ref 11.7–15.5)
Lymphocytes Relative: 29 %
Lymphs Abs: 1682 {cells}/uL (ref 850–3900)
MCH: 27.1 pg (ref 27.0–33.0)
MCHC: 32 g/dL (ref 32.0–36.0)
MCV: 84.7 fL (ref 80.0–100.0)
MPV: 10.5 fL (ref 7.5–12.5)
Monocytes Absolute: 522 {cells}/uL (ref 200–950)
Monocytes Relative: 9 %
Neutro Abs: 3538 {cells}/uL (ref 1500–7800)
Neutrophils Relative %: 61 %
Platelets: 248 10*3/uL (ref 140–400)
RBC: 4.5 MIL/uL (ref 3.80–5.10)
RDW: 16.4 % — ABNORMAL HIGH (ref 11.0–15.0)
WBC: 5.8 10*3/uL (ref 3.8–10.8)

## 2016-10-14 LAB — POCT GLYCOSYLATED HEMOGLOBIN (HGB A1C): Hemoglobin A1C: 7.7

## 2016-10-14 MED ORDER — CITALOPRAM HYDROBROMIDE 20 MG PO TABS: 20.0000 mg | ORAL_TABLET | Freq: Every day | ORAL | 1 refills | Status: DC

## 2016-10-14 MED ORDER — VITAMIN D (ERGOCALCIFEROL) 1.25 MG (50000 UNIT) PO CAPS: 50000.0000 [IU] | ORAL_CAPSULE | ORAL | 0 refills | Status: DC

## 2016-10-14 MED ORDER — LOSARTAN POTASSIUM 25 MG PO TABS: 25.0000 mg | ORAL_TABLET | Freq: Every day | ORAL | 1 refills | Status: DC

## 2016-10-14 MED ORDER — ROSUVASTATIN CALCIUM 10 MG PO TABS: 10.0000 mg | ORAL_TABLET | Freq: Every day | ORAL | 1 refills | Status: DC

## 2016-10-14 NOTE — Progress Notes (Signed)
Name: Lisa [name]   MRN: [id number]    DOB: 04/27/1946   Date:10/14/2016       Progress Note  Subjective  Chief Complaint  Chief Complaint  Patient presents with  . Diabetes    not checking daily 150 highest normal 130-140 has been out of metfomin for a few days                                             . Hyperlipidemia  . Depression  . Fatigue    HPI  DMII: last hgbA1C is going up at 7.7% - she has not been taking Glipizide XL daily.She very seldom has sensation of hypoglycemia but she always has a peppermint in her purse Taking ARB for kidney protection. She sees vascular surgeon and is on Plavix. She does not want to take medication for phanton syndrome. She is drinking sodas lately.   HTN: taking medications, denies side effects. No chest pain or palpitation . BP is better today.   Major Depression in remission she Is doing well on Citalopram, being more independent, getting out the house more, no crying spells, no suicidal thoughts or ideation. She has been carrying for elderly relatives and sometimes it drains her. She still gets frustrated with partially amputated left foot - she tried prosthesis but it did not work for her, but still had to pay for it.   Vitamin B12 and Vitamin D deficiency: she is back taking supplementations   Peripheral neuropathy and phanton pain: she states pain is continuous ,pain is constant, right now it is a  6/10 She does not want to take any prescription medication at this time she will continue to take prn Tylenol  PVD: she is due for follow up with Dr. Lucky Cowboy, she is on Plavix, s/p amputation. She does not take the Ultram but sometimes the pain can be intense on her left foot.   Dyslipidemia: taking Crestor and denies myalgia. No chest pain  Change in bowel movement: she states as a young adult she used to strain and had bowel movements only every two weeks, however over the past 4 months she has noticed Bristol scale of 2-3 but now  daily, no straining. She does not want to see GI, explained still constipation but she does not want to try Miralax.  Blind right eye: sees ophthalmologist , she states she is also frustrated about the management of her vision, she states her glasses are not right.   Patient Active Problem List   Diagnosis Date Noted  . Uncontrolled type 2 diabetes with eye complications ([place]) 58/85/0277  . DM (diabetes mellitus), type 2 with peripheral vascular complications ([place]) 41/28/7867  . Amputated toe ([place]) 12/02/2014  . Benign essential HTN 12/02/2014  . Depression, major, recurrent, mild ([place]) 12/02/2014  . Dyslipidemia 12/02/2014  . Vitamin B12 deficiency 12/02/2014  . Peripheral neuropathic pain 12/02/2014  . Peripheral artery disease ([place]) 12/02/2014  . Phantom limb ([place]) 12/02/2014  . Diabetes mellitus with nephropathy ([place]) 12/02/2014  . Detached retina 12/02/2014  . Type 2 diabetes mellitus with peripheral neuropathy ([place]) 12/02/2014  . Tobacco use 12/02/2014  . Urge incontinence 12/02/2014  . 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  . Vitamin D deficiency 12/02/2014    Past Surgical History:  Procedure Laterality Date  . ABDOMINAL HYSTERECTOMY    . EYE SURGERY    . TOE AMPUTATION  Left 2011   all toes  . VASCULAR SURGERY      Family History  Problem Relation Age of Onset  . Diabetes Mother   . Diabetes Sister   . Diabetes Sister     Social History   Social History  . Marital status: Widowed    Spouse name: N/A  . Number of children: N/A  . Years of education: N/A   Occupational History  . Not on file.   Social History Main Topics  . Smoking status: Current Every Day Smoker    Packs/day: 0.50    Years: 18.00  . Smokeless tobacco: Never Used  . Alcohol use No  . Drug use: No  . Sexual activity: Not Currently   Other Topics Concern  . Not on file   Social History Narrative  . No narrative on file     Current Outpatient Prescriptions:  .  aspirin 81 MG tablet, Take  81 mg by mouth daily., Disp: , Rfl:  .  atropine 1 % ophthalmic solution, Place 1 drop into the right eye 3 (three) times daily., Disp: , Rfl:  .  blood glucose meter kit and supplies, , Disp: , Rfl:  .  brimonidine (ALPHAGAN) 0.2 % ophthalmic solution, USE 1 DROP IN BOTH EYE TWICE A DAY, Disp: , Rfl: 3 .  citalopram (CELEXA) 20 MG tablet, Take 1 tablet (20 mg total) by mouth daily., Disp: 90 tablet, Rfl: 1 .  clopidogrel (PLAVIX) 75 MG tablet, TAKE 1 TABLET (75 MG TOTAL) BY MOUTH ONCE., Disp: , Rfl: 1 .  dorzolamide-timolol (COSOPT) 22.3-6.8 MG/ML ophthalmic solution, USE 1 DROP IN BOTH EYES 2 TIMES A DAY, Disp: , Rfl: 4 .  glipiZIDE (GLIPIZIDE XL) 5 MG 24 hr tablet, Take 1 tablet (5 mg total) by mouth daily with breakfast., Disp: 90 tablet, Rfl: 1 .  losartan (COZAAR) 25 MG tablet, Take 1 tablet (25 mg total) by mouth daily., Disp: 90 tablet, Rfl: 1 .  metformin (FORTAMET) 1000 MG (OSM) 24 hr tablet, TAKE 1 TABLET BY MOUTH EVERY DAY WITH BREAKFAST (INS COVERS 60 NOT 90), Disp: 90 tablet, Rfl: 0 .  Methylcobalamin (B12-ACTIVE) 1 MG CHEW, Chew 2 tablets by mouth daily., Disp: , Rfl:  .  Multiple Vitamin (MULTIVITAMIN) tablet, Take 1 tablet by mouth daily., Disp: , Rfl:  .  prednisoLONE acetate (PRED FORTE) 1 % ophthalmic suspension, USE 1 DROP IN RIGHT EYE 4 TIMES A DAY, Disp: , Rfl: 3 .  rosuvastatin (CRESTOR) 10 MG tablet, Take 1 tablet (10 mg total) by mouth daily., Disp: 90 tablet, Rfl: 1 .  timolol (BETIMOL) 0.5 % ophthalmic solution, 1 drop., Disp: , Rfl:  .  Vitamin D, Ergocalciferol, (DRISDOL) 50000 units CAPS capsule, TAKE 1 CAPSULE (50,000 UNITS TOTAL) BY MOUTH EVERY 7 (SEVEN) DAYS., Disp: 12 capsule, Rfl: 0  Allergies  Allergen Reactions  . Other Itching    Nucinta     ROS  Constitutional: Negative for fever or weight change.  Respiratory: Negative for cough and shortness of breath.   Cardiovascular: Negative for chest pain or palpitations.  Gastrointestinal: Negative for  abdominal pain, no bowel changes.  Musculoskeletal: Negative for gait problem or joint swelling.  Skin: Negative for rash.   Neurological: Negative for dizziness or headache.  No other specific complaints in a complete review of systems (except as listed in HPI above).  Objective  Vitals:   10/14/16 1536  BP: (!) 148/68  Pulse: 75  Resp: 16  Temp: 98.7 F (  37.1 C)  SpO2: 96%  Weight: 153 lb (69.4 kg)  Height: _0  (1.651 m)    Body mass index is 25.46 kg/m.  Physical Exam  Constitutional: Patient appears well-developed and well-nourished. Obese No distress.  HEENT: head atraumatic, normocephalic, pupils equal and reactive to light, neck supple, throat within normal limits Cardiovascular: Normal rate, regular rhythm and normal heart sounds. No murmur heard. No BLE edema. Pulmonary/Chest: Effort normal and breath sounds normal. No respiratory distress. Abdominal: Soft. There is no tenderness. Psychiatric: Patient has a normal mood and affect. behavior is normal. Judgment and thought content normal. Muscular Skeletal: partial amputation left foot ( forefoot )  PHQ2/9: Depression screen [facility] 2/9 09/25/2015 05/08/2015 12/05/2014  Decreased Interest 0 0 0  Down, Depressed, Hopeless 0 0 0  PHQ - 2 Score 0 0 0    Fall Risk: Fall Risk  09/25/2015 05/08/2015 12/05/2014  Falls in the past year? No No Yes  Number falls in past yr: - - 2 or more  Injury with Fall? - - No     Assessment & Plan  1. Type 2 diabetes mellitus with peripheral neuropathy (HCC)  - POCT HgB A1C 7.7% - COMPLETE METABOLIC PANEL WITH GFR  2. Benign essential HTN  Improved, check labs , bp still not at goal, but she refuses to go up on Losartan dose or add a new medication - losartan (COZAAR) 25 MG tablet; Take 1 tablet (25 mg total) by mouth daily.  Dispense: 90 tablet; Refill: 1 - COMPLETE METABOLIC PANEL WITH GFR - CBC with Differential/Platelet  3. Depression, major, recurrent, mild (HCC)  -  citalopram (CELEXA) 20 MG tablet; Take 1 tablet (20 mg total) by mouth daily.  Dispense: 90 tablet; Refill: 1  4. Dyslipidemia  - rosuvastatin (CRESTOR) 10 MG tablet; Take 1 tablet (10 mg total) by mouth daily.  Dispense: 90 tablet; Refill: 1 - Lipid panel  5. Peripheral artery disease (HCC)  - Lipid panel  6. Phantom limb (HCC)  Stable - daily   7. Vitamin B12 deficiency  She is not taking SL vitamin B12 - we will check level and if not normal we will change it - Vitamin B12 - CBC with Differential/Platelet  8. Vitamin D deficiency  - Vitamin D, Ergocalciferol, (DRISDOL) 50000 units CAPS capsule; Take 1 capsule (50,000 Units total) by mouth every 7 (seven) days.  Dispense: 12 capsule; Refill: 0  9. Change in bowel movement  - TSH Discussed Miralax

## 2016-10-15 LAB — LIPID PANEL
Cholesterol: 145 mg/dL (ref <200)
HDL: 64 mg/dL (ref >50)
LDL CALC: 62 mg/dL (ref <100)
TRIGLYCERIDES: 94 mg/dL (ref <150)
Total CHOL/HDL Ratio: 2.3 Ratio (ref <5.0)
VLDL: 19 mg/dL (ref <30)

## 2016-10-15 LAB — COMPLETE METABOLIC PANEL WITH GFR
ALBUMIN: 3.9 g/dL (ref 3.6–5.1)
ALK PHOS: 82 U/L (ref 33–130)
ALT: 9 U/L (ref 6–29)
AST: 15 U/L (ref 10–35)
BUN: 6 mg/dL — ABNORMAL LOW (ref 7–25)
CALCIUM: 9.2 mg/dL (ref 8.6–10.4)
CHLORIDE: 108 mmol/L (ref 98–110)
CO2: 22 mmol/L (ref 20–31)
Creat: 0.85 mg/dL (ref 0.60–0.93)
GFR, EST AFRICAN AMERICAN: 80 mL/min (ref >=60)
GFR, EST NON AFRICAN AMERICAN: 69 mL/min (ref >=60)
Glucose, Bld: 103 mg/dL — ABNORMAL HIGH (ref 65–99)
POTASSIUM: 3.9 mmol/L (ref 3.5–5.3)
Sodium: 143 mmol/L (ref 135–146)
Total Bilirubin: 0.5 mg/dL (ref 0.2–1.2)
Total Protein: 6.4 g/dL (ref 6.1–8.1)

## 2016-10-15 LAB — TSH: TSH: 2.68 mIU/L

## 2016-10-15 LAB — VITAMIN B12: Vitamin B-12: 1677 pg/mL — ABNORMAL HIGH (ref 200–1100)

## 2016-10-23 DIAGNOSIS — H401121 Primary open-angle glaucoma, left eye, mild stage: Secondary | ICD-10-CM | POA: Diagnosis not present

## 2016-11-10 ENCOUNTER — Telehealth: Payer: Self-pay | Admitting: Family Medicine

## 2016-11-29 ENCOUNTER — Other Ambulatory Visit: Payer: Self-pay | Admitting: Family Medicine

## 2016-11-29 DIAGNOSIS — M792 Neuralgia and neuritis, unspecified: Secondary | ICD-10-CM

## 2017-01-21 ENCOUNTER — Other Ambulatory Visit: Payer: Self-pay | Admitting: Family Medicine

## 2017-01-21 DIAGNOSIS — E559 Vitamin D deficiency, unspecified: Secondary | ICD-10-CM

## 2017-01-21 NOTE — Telephone Encounter (Signed)
[facility] 

## 2017-01-23 ENCOUNTER — Other Ambulatory Visit: Payer: Self-pay | Admitting: Family Medicine

## 2017-02-17 ENCOUNTER — Encounter: Payer: Self-pay | Admitting: Family Medicine

## 2017-02-17 ENCOUNTER — Ambulatory Visit (INDEPENDENT_AMBULATORY_CARE_PROVIDER_SITE_OTHER): Payer: Medicare Other | Admitting: Family Medicine

## 2017-02-17 VITALS — BP 130/68 | HR 74 | Temp 98.0°F | Resp 16 | Ht 65.0 in | Wt 151.4 lb

## 2017-02-17 DIAGNOSIS — R229 Localized swelling, mass and lump, unspecified: Secondary | ICD-10-CM | POA: Diagnosis not present

## 2017-02-17 DIAGNOSIS — I739 Peripheral vascular disease, unspecified: Secondary | ICD-10-CM

## 2017-02-17 DIAGNOSIS — Z23 Encounter for immunization: Secondary | ICD-10-CM

## 2017-02-17 DIAGNOSIS — E1151 Type 2 diabetes mellitus with diabetic peripheral angiopathy without gangrene: Secondary | ICD-10-CM | POA: Diagnosis not present

## 2017-02-17 DIAGNOSIS — I10 Essential (primary) hypertension: Secondary | ICD-10-CM

## 2017-02-17 DIAGNOSIS — E1121 Type 2 diabetes mellitus with diabetic nephropathy: Secondary | ICD-10-CM | POA: Diagnosis not present

## 2017-02-17 DIAGNOSIS — G547 Phantom limb syndrome without pain: Secondary | ICD-10-CM | POA: Diagnosis not present

## 2017-02-17 DIAGNOSIS — E785 Hyperlipidemia, unspecified: Secondary | ICD-10-CM

## 2017-02-17 DIAGNOSIS — F330 Major depressive disorder, recurrent, mild: Secondary | ICD-10-CM | POA: Diagnosis not present

## 2017-02-17 LAB — POCT UA - MICROALBUMIN: Microalbumin Ur, POC: 50 mg/L

## 2017-02-17 LAB — POCT GLYCOSYLATED HEMOGLOBIN (HGB A1C): HEMOGLOBIN A1C: 7.6

## 2017-02-17 MED ORDER — LOSARTAN POTASSIUM 25 MG PO TABS: 25.0000 mg | ORAL_TABLET | Freq: Every day | ORAL | 1 refills | Status: DC

## 2017-02-17 MED ORDER — CITALOPRAM HYDROBROMIDE 20 MG PO TABS: 20.0000 mg | ORAL_TABLET | Freq: Every day | ORAL | 1 refills | Status: DC

## 2017-02-17 MED ORDER — METFORMIN HCL ER (OSM) 1000 MG PO TB24: ORAL_TABLET | ORAL | 0 refills | Status: DC

## 2017-02-17 NOTE — Addendum Note (Signed)
Addended by: Cynda Familia on: 02/17/2017 03:19 PM   Modules accepted: Orders

## 2017-02-17 NOTE — Progress Notes (Signed)
Name: Lisa [name]   MRN: [id number]    DOB: [birth year]   Date:02/17/2017       Progress Note  Subjective  Chief Complaint  Chief Complaint  Patient presents with  . Medication Refill    4 month F/U  . Diabetes    Checks when she remembers, Averages around 123 Highest-150. Would like to discuss [employer]  . Hypertension    Edema in left ankle occasionally  . Depression  . Peripheral Neuropathy  . Dyslipidemia    HPI   DMII: last hgbA1C is going up at 7.7% , down to 7.6%  she has not been taking Glipizide XL daily, but compliant with Metformin..No recent episodes of hypoglycemia.Taking ARB for kidney protection, but urine micro has gone up. She sees vascular surgeon and is on Plavix. She does not want to take medication for phanton syndrome. She is drinking sodas lately.   HTN: taking medications, denies side effects. No chest pain or palpitation . BP is at goal at this time  Major Depression in remission she Is doing well on Citalopram, she has noticed more fatigue and lack of motivation lately, but denies crying spells, no suicidal thoughts or ideation. She has been carrying for elderly relatives and sometimes it drains her. She still gets frustrated with partially amputated left foot.   Vitamin B12 and Vitamin D deficiency: she is back taking supplementations , but down on B12 because level was high   Peripheral neuropathy and phanton pain: she states pain is continuous ,pain is constant, right now it is a  6-7/10 She does not want to take any prescription medication at this time she will continue to take prn Tylenol  PVD: she is due for follow up with Dr. Wyn Quaker, she is on Plavix, s/p amputation. She does not take the Ultram but sometimes the pain can be intense on her left foot.   Dyslipidemia: taking Crestor and denies myalgia. No chest pain  Blind right eye: sees ophthalmologist , she states she is also frustrated about the management of her vision, she states  her glasses are not right.   Patient Active Problem List   Diagnosis Date Noted  . Uncontrolled type 2 diabetes with eye complications (HCC) 01/29/2016  . DM (diabetes mellitus), type 2 with peripheral vascular complications (HCC) 09/25/2015  . Amputated toe (HCC) 12/02/2014  . Benign essential HTN 12/02/2014  . Depression, major, recurrent, mild (HCC) 12/02/2014  . Dyslipidemia 12/02/2014  . Vitamin B12 deficiency 12/02/2014  . Peripheral neuropathic pain 12/02/2014  . Peripheral artery disease (HCC) 12/02/2014  . Phantom limb (HCC) 12/02/2014  . Diabetes mellitus with nephropathy (HCC) 12/02/2014  . Detached retina 12/02/2014  . Type 2 diabetes mellitus with peripheral neuropathy (HCC) 12/02/2014  . Tobacco use 12/02/2014  . Urge incontinence 12/02/2014  . 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  . Vitamin D deficiency 12/02/2014    Past Surgical History:  Procedure Laterality Date  . ABDOMINAL HYSTERECTOMY    . EYE SURGERY    . TOE AMPUTATION Left 2011   all toes  . VASCULAR SURGERY      Family History  Problem Relation Age of Onset  . Diabetes Mother   . Diabetes Sister   . Diabetes Sister     Social History   Social History  . Marital status: Widowed    Spouse name: N/A  . Number of children: N/A  . Years of education: N/A   Occupational History  . Not on file.   Social History  Main Topics  . Smoking status: Current Every Day Smoker    Packs/day: 0.50    Years: 18.00  . Smokeless tobacco: Never Used  . Alcohol use No  . Drug use: No  . Sexual activity: Not Currently   Other Topics Concern  . Not on file   Social History Narrative  . No narrative on file     Current Outpatient Prescriptions:  .  aspirin 81 MG tablet, Take 81 mg by mouth daily., Disp: , Rfl:  .  atropine 1 % ophthalmic solution, Place 1 drop into the right eye 3 (three) times daily., Disp: , Rfl:  .  blood glucose meter kit and supplies, , Disp: , Rfl:  .  brimonidine (ALPHAGAN) 0.2 %  ophthalmic solution, USE 1 DROP IN BOTH EYE TWICE A DAY, Disp: , Rfl: 3 .  citalopram (CELEXA) 20 MG tablet, Take 1 tablet (20 mg total) by mouth daily., Disp: 90 tablet, Rfl: 1 .  clopidogrel (PLAVIX) 75 MG tablet, Take 1 tablet by mouth daily., Disp: , Rfl:  .  dorzolamide-timolol (COSOPT) 22.3-6.8 MG/ML ophthalmic solution, USE 1 DROP IN BOTH EYES 2 TIMES A DAY, Disp: , Rfl: 4 .  glipiZIDE (GLIPIZIDE XL) 5 MG 24 hr tablet, Take 1 tablet (5 mg total) by mouth daily with breakfast., Disp: 90 tablet, Rfl: 1 .  losartan (COZAAR) 25 MG tablet, Take 1 tablet (25 mg total) by mouth daily., Disp: 90 tablet, Rfl: 1 .  metformin (FORTAMET) 1000 MG (OSM) 24 hr tablet, TAKE 1 TABLET BY MOUTH EVERY DAY WITH BREAKFAST (INS COVERS 60 NOT 90), Disp: 90 tablet, Rfl: 0 .  Methylcobalamin (B12-ACTIVE) 1 MG CHEW, Chew 2 tablets by mouth daily., Disp: , Rfl:  .  Multiple Vitamin (MULTIVITAMIN) tablet, Take 1 tablet by mouth daily., Disp: , Rfl:  .  prednisoLONE acetate (PRED FORTE) 1 % ophthalmic suspension, USE 1 DROP IN RIGHT EYE 4 TIMES A DAY, Disp: , Rfl: 3 .  rosuvastatin (CRESTOR) 10 MG tablet, Take 1 tablet (10 mg total) by mouth daily., Disp: 90 tablet, Rfl: 1 .  timolol (BETIMOL) 0.5 % ophthalmic solution, 1 drop., Disp: , Rfl:  .  Vitamin D, Ergocalciferol, (DRISDOL) 50000 units CAPS capsule, TAKE ONE CAPSULE BY MOUTH EVERY 7 DAYS, Disp: 12 capsule, Rfl: 0  Allergies  Allergen Reactions  . Other Itching    Nucinta     ROS  Constitutional: Negative for fever or weight change. She has fatigue Respiratory: Negative for cough and shortness of breath.   Cardiovascular: Negative for chest pain or palpitations.  Gastrointestinal: Negative for abdominal pain, no bowel changes.  Musculoskeletal:Positive for gait problem  ( from left foot amputation)  or joint swelling.  Skin: Negative for rash.  Neurological: Negative for dizziness or headache.  No other specific complaints in a complete review of  systems (except as listed in HPI above).  Objective  Vitals:   02/17/17 1422  BP: 130/68  Pulse: 74  Resp: 16  Temp: 98 F (36.7 C)  TempSrc: Oral  SpO2: 98%  Weight: 151 lb 6.4 oz (68.7 kg)  Height: '5\' 5"'$  (1.651 m)    Body mass index is 25.19 kg/m.  Physical Exam  Constitutional: Patient appears well-developed and well-nourished. Overweight.  No distress.  HEENT: head atraumatic, normocephalic, pupils equal and reactive to light, neck supple, throat within normal limits Cardiovascular: Normal rate, regular rhythm and normal heart sounds.  No murmur heard. No BLE edema. Pulmonary/Chest: Effort normal and breath sounds  normal. No respiratory distress. Abdominal: Soft.  There is no tenderness. Skin: sebaceous cysts on vulva area, gave reassurance ( non-tender )  Psychiatric: Patient has a normal mood and affect. behavior is normal. Judgment and thought content normal.   Recent Results (from the past 2160 hour(s))  POCT HgB A1C     Status: None   Collection Time: 02/17/17  2:25 PM  Result Value Ref Range   Hemoglobin A1C 7.6   POCT UA - Microalbumin     Status: Abnormal   Collection Time: 02/17/17  2:25 PM  Result Value Ref Range   Microalbumin Ur, POC 50 mg/L   Creatinine, POC  mg/dL   Albumin/Creatinine Ratio, Urine, POC      Diabetic Foot Exam: Diabetic Foot Exam - Simple   Simple Foot Form Diabetic Foot exam was performed with the following findings:  Yes 02/17/2017  2:53 PM  Visual Inspection See comments:  Yes Sensation Testing Intact to touch and monofilament testing bilaterally:  Yes Pulse Check See comments:  Yes Comments History of forefoot amputation of left foot, normal sensation, long toe nails, decrease distal pulses       PHQ2/9: Depression screen [facility] 2/9 02/17/2017 09/25/2015 05/08/2015 12/05/2014  Decreased Interest 0 0 0 0  Down, Depressed, Hopeless 1 0 0 0  PHQ - 2 Score 1 0 0 0     Fall Risk: Fall Risk  02/17/2017 09/25/2015 05/08/2015  12/05/2014  Falls in the past year? No No No Yes  Number falls in past yr: - - - 2 or more  Injury with Fall? - - - No    Functional Status Survey: Is the patient deaf or have difficulty hearing?: No Does the patient have difficulty seeing, even when wearing glasses/contacts?: Yes (Blind in her right eye) Does the patient have difficulty concentrating, remembering, or making decisions?: No Does the patient have difficulty walking or climbing stairs?: No Does the patient have difficulty dressing or bathing?: No Does the patient have difficulty doing errands alone such as visiting a doctor's office or shopping?: No    Assessment & Plan  1. DM (diabetes mellitus), type 2 with peripheral vascular complications (HCC)  - POCT HgB A1C - POCT UA - Microalbumin - metformin (FORTAMET) 1000 MG (OSM) 24 hr tablet; TAKE 1 TABLET BY MOUTH EVERY DAY WITH BREAKFAST (INS COVERS 60 NOT 90)  Dispense: 90 tablet; Refill: 0  2. Benign essential HTN  - losartan (COZAAR) 25 MG tablet; Take 1 tablet (25 mg total) by mouth daily.  Dispense: 90 tablet; Refill: 1  3. Depression, major, recurrent, mild (HCC)  Fatigue is likely from depression, she does not want to see therapist or take different medication  - citalopram (CELEXA) 20 MG tablet; Take 1 tablet (20 mg total) by mouth daily.  Dispense: 90 tablet; Refill: 1  4. Dyslipidemia  On Crestor, reviewed last labs with patient today   5. Peripheral artery disease ([place])  Continue follow up with Dr. Lucky Cowboy  6. Phantom limb ([place])  stable  7. Diabetes mellitus with nephropathy (HCC)  And microalbuminuria, discussed going up on Losartan but she wants to hold off for now - metformin (FORTAMET) 1000 MG (OSM) 24 hr tablet; TAKE 1 TABLET BY MOUTH EVERY DAY WITH BREAKFAST (INS COVERS 60 NOT 90)  Dispense: 90 tablet; Refill: 0  8. Nodule, subcutaneous  Reassurance, vulva area

## 2017-03-03 DIAGNOSIS — H401121 Primary open-angle glaucoma, left eye, mild stage: Secondary | ICD-10-CM | POA: Diagnosis not present

## 2017-03-27 ENCOUNTER — Other Ambulatory Visit: Payer: Self-pay | Admitting: Family Medicine

## 2017-03-27 DIAGNOSIS — E1151 Type 2 diabetes mellitus with diabetic peripheral angiopathy without gangrene: Secondary | ICD-10-CM

## 2017-03-27 DIAGNOSIS — E1121 Type 2 diabetes mellitus with diabetic nephropathy: Secondary | ICD-10-CM

## 2017-03-28 ENCOUNTER — Other Ambulatory Visit: Payer: Self-pay | Admitting: Family Medicine

## 2017-04-10 ENCOUNTER — Other Ambulatory Visit: Payer: Self-pay

## 2017-04-10 DIAGNOSIS — E559 Vitamin D deficiency, unspecified: Secondary | ICD-10-CM

## 2017-04-10 MED ORDER — VITAMIN D (ERGOCALCIFEROL) 1.25 MG (50000 UNIT) PO CAPS: 50000.0000 [IU] | ORAL_CAPSULE | ORAL | 0 refills | Status: DC

## 2017-04-10 NOTE — Telephone Encounter (Signed)
Patient requesting refill of Vitamin D to [facility].   Last office visit was 02/17/2017.

## 2017-05-14 ENCOUNTER — Other Ambulatory Visit: Payer: Self-pay | Admitting: Family Medicine

## 2017-05-14 NOTE — Telephone Encounter (Signed)
Refill request for diabetic medication:   Metformin 1000 mg 24 hr tablet  Last office visit pertaining to diabetes: 02/17/2017  Follow up visit: 06/17/2017  Lab Results  Component Value Date   HGBA1C 7.6 02/17/2017

## 2017-06-05 ENCOUNTER — Other Ambulatory Visit: Payer: Self-pay

## 2017-06-05 DIAGNOSIS — E559 Vitamin D deficiency, unspecified: Secondary | ICD-10-CM

## 2017-06-05 MED ORDER — VITAMIN D (ERGOCALCIFEROL) 1.25 MG (50000 UNIT) PO CAPS: 50000.0000 [IU] | ORAL_CAPSULE | ORAL | 0 refills | Status: DC

## 2017-06-05 NOTE — Telephone Encounter (Signed)
Refill request for general medication: Vitamin D 50,000  Last office visit: 02/17/2017  Last physical exam: None indicated  Follow-up on file. 06/17/2017

## 2017-06-17 ENCOUNTER — Ambulatory Visit: Payer: Medicare Other | Admitting: Family Medicine

## 2017-07-07 NOTE — Telephone Encounter (Signed)
Visit complete.

## 2017-07-09 ENCOUNTER — Ambulatory Visit (INDEPENDENT_AMBULATORY_CARE_PROVIDER_SITE_OTHER): Payer: Medicare Other | Admitting: Family Medicine

## 2017-07-09 ENCOUNTER — Encounter: Payer: Self-pay | Admitting: Family Medicine

## 2017-07-09 VITALS — BP 140/70 | HR 64 | Resp 14 | Ht 65.0 in | Wt 151.1 lb

## 2017-07-09 DIAGNOSIS — E1139 Type 2 diabetes mellitus with other diabetic ophthalmic complication: Secondary | ICD-10-CM

## 2017-07-09 DIAGNOSIS — E785 Hyperlipidemia, unspecified: Secondary | ICD-10-CM | POA: Diagnosis not present

## 2017-07-09 DIAGNOSIS — E538 Deficiency of other specified B group vitamins: Secondary | ICD-10-CM | POA: Diagnosis not present

## 2017-07-09 DIAGNOSIS — E1142 Type 2 diabetes mellitus with diabetic polyneuropathy: Secondary | ICD-10-CM

## 2017-07-09 DIAGNOSIS — Z89422 Acquired absence of other left toe(s): Secondary | ICD-10-CM

## 2017-07-09 DIAGNOSIS — I739 Peripheral vascular disease, unspecified: Secondary | ICD-10-CM | POA: Diagnosis not present

## 2017-07-09 DIAGNOSIS — Z1231 Encounter for screening mammogram for malignant neoplasm of breast: Secondary | ICD-10-CM | POA: Diagnosis not present

## 2017-07-09 DIAGNOSIS — E1165 Type 2 diabetes mellitus with hyperglycemia: Secondary | ICD-10-CM | POA: Diagnosis not present

## 2017-07-09 DIAGNOSIS — F330 Major depressive disorder, recurrent, mild: Secondary | ICD-10-CM | POA: Diagnosis not present

## 2017-07-09 DIAGNOSIS — E559 Vitamin D deficiency, unspecified: Secondary | ICD-10-CM | POA: Diagnosis not present

## 2017-07-09 DIAGNOSIS — G547 Phantom limb syndrome without pain: Secondary | ICD-10-CM

## 2017-07-09 DIAGNOSIS — S98132A Complete traumatic amputation of one left lesser toe, initial encounter: Secondary | ICD-10-CM

## 2017-07-09 DIAGNOSIS — I10 Essential (primary) hypertension: Secondary | ICD-10-CM

## 2017-07-09 DIAGNOSIS — IMO0002 Reserved for concepts with insufficient information to code with codable children: Secondary | ICD-10-CM

## 2017-07-09 DIAGNOSIS — J069 Acute upper respiratory infection, unspecified: Secondary | ICD-10-CM

## 2017-07-09 DIAGNOSIS — N3941 Urge incontinence: Secondary | ICD-10-CM

## 2017-07-09 LAB — POCT GLYCOSYLATED HEMOGLOBIN (HGB A1C): HEMOGLOBIN A1C: 7.6

## 2017-07-09 MED ORDER — LOSARTAN POTASSIUM 25 MG PO TABS: 25.0000 mg | ORAL_TABLET | Freq: Every day | ORAL | 1 refills | Status: DC

## 2017-07-09 MED ORDER — ROSUVASTATIN CALCIUM 10 MG PO TABS: 10.0000 mg | ORAL_TABLET | Freq: Every day | ORAL | 1 refills | Status: DC

## 2017-07-09 MED ORDER — METFORMIN HCL ER (OSM) 1000 MG PO TB24: 1000.0000 mg | ORAL_TABLET | Freq: Every day | ORAL | 1 refills | Status: DC

## 2017-07-09 MED ORDER — CITALOPRAM HYDROBROMIDE 20 MG PO TABS: 20.0000 mg | ORAL_TABLET | Freq: Every day | ORAL | 1 refills | Status: DC

## 2017-07-09 MED ORDER — CLOPIDOGREL BISULFATE 75 MG PO TABS: 75.0000 mg | ORAL_TABLET | Freq: Every day | ORAL | 1 refills | Status: DC

## 2017-07-09 NOTE — Progress Notes (Signed)
Name: Lisa [name]   MRN: [id number]    DOB: 1945-12-04   Date:07/09/2017       Progress Note  Subjective  Chief Complaint  Chief Complaint  Patient presents with  . Medication Refill    4 month F/U  . Diabetes  . Depression  . Hypertension  . Dyslipidemia  . Peripheral Neuropathy    HPI   DMII: last hgbA1C is going up at 7.7% , 7.6% and again at 7.6%  she is off Glipizide daily, but compliant with Metformin..No recent episodes of hypoglycemia.Taking ARB for kidney protection. She sees vascular surgeon and is on Plavix. She does not want to take medication for phanton syndrome. She denies polyphagia but states she likes to drink water and has urinary frequency  Urinary incontinence: going on for months, we will check urine culture, no dysuria or hematuria.   URI: she states exposed to a child that had URI, she has rhinorrhea, nasal congestion, no fever or chills. Started a few days ago   HTN: taking medications, denies side effects. No chest pain or palpitation . BP is okay today, usually below 140/80  Major Depression in remission she Is doing well on Citalopram, she has noticed more fatigue and lack of motivation lately, but denies crying spells, no suicidal thoughts or ideation. She missed relatives that have died.   Vitamin B12 and Vitamin D deficiency: she is back taking supplementations , but down on B12 because level was high   Peripheral neuropathy and phanton pain: she states pain is continuous ,pain is constant, right now it is a 5/10 She does not want to take any prescription medication at this time she will continue to take prn Tylenol  PVD: she sees  Dr. Lucky Cowboy, she is on Plavix, s/p amputation. She no longer takes  Ultram but sometimes the pain can be intense on her left foot.   Dyslipidemia: taking Crestor and denies myalgia. No chest pain.   Blind right eye: sees ophthalmologist ,has follow up next week   Patient Active Problem List   Diagnosis Date  Noted  . Uncontrolled type 2 diabetes with eye complications ([place]) 74/04/8785  . DM (diabetes mellitus), type 2 with peripheral vascular complications ([place]) 76/72/0947  . Amputated toe ([place]) 12/02/2014  . Benign essential HTN 12/02/2014  . Depression, major, recurrent, mild ([place]) 12/02/2014  . Dyslipidemia 12/02/2014  . Vitamin B12 deficiency 12/02/2014  . Peripheral neuropathic pain 12/02/2014  . Peripheral artery disease ([place]) 12/02/2014  . Phantom limb ([place]) 12/02/2014  . Diabetes mellitus with nephropathy ([place]) 12/02/2014  . Detached retina 12/02/2014  . Type 2 diabetes mellitus with peripheral neuropathy ([place]) 12/02/2014  . Tobacco use 12/02/2014  . Urge incontinence 12/02/2014  . 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  . Vitamin D deficiency 12/02/2014    Past Surgical History:  Procedure Laterality Date  . ABDOMINAL HYSTERECTOMY    . EYE SURGERY    . TOE AMPUTATION Left 2011   all toes  . VASCULAR SURGERY      Family History  Problem Relation Age of Onset  . Diabetes Mother   . Diabetes Sister   . Diabetes Sister     Social History   Socioeconomic History  . Marital status: Widowed    Spouse name: Not on file  . Number of children: Not on file  . Years of education: Not on file  . Highest education level: Not on file  Social Needs  . Financial resource strain: Not on file  . Food  insecurity - worry: Not on file  . Food insecurity - inability: Not on file  . Transportation needs - medical: Not on file  . Transportation needs - non-medical: Not on file  Occupational History  . Not on file  Tobacco Use  . Smoking status: Current Every Day Smoker    Packs/day: 0.50    Years: 18.00    Pack years: 9.00  . Smokeless tobacco: Never Used  Substance and Sexual Activity  . Alcohol use: No    Alcohol/week: 0.0 oz  . Drug use: No  . Sexual activity: Not Currently  Other Topics Concern  . Not on file  Social History Narrative  . Not on file     Current Outpatient  Medications:  .  aspirin 81 MG tablet, Take 81 mg by mouth daily., Disp: , Rfl:  .  atropine 1 % ophthalmic solution, Place 1 drop into the right eye 3 (three) times daily., Disp: , Rfl:  .  blood glucose meter kit and supplies, , Disp: , Rfl:  .  brimonidine (ALPHAGAN) 0.2 % ophthalmic solution, USE 1 DROP IN BOTH EYE TWICE A DAY, Disp: , Rfl: 3 .  citalopram (CELEXA) 20 MG tablet, Take 1 tablet (20 mg total) by mouth daily., Disp: 90 tablet, Rfl: 1 .  clopidogrel (PLAVIX) 75 MG tablet, Take 1 tablet (75 mg total) by mouth daily., Disp: 90 tablet, Rfl: 1 .  dorzolamide-timolol (COSOPT) 22.3-6.8 MG/ML ophthalmic solution, USE 1 DROP IN BOTH EYES 2 TIMES A DAY, Disp: , Rfl: 4 .  losartan (COZAAR) 25 MG tablet, Take 1 tablet (25 mg total) by mouth daily., Disp: 90 tablet, Rfl: 1 .  metformin (FORTAMET) 1000 MG (OSM) 24 hr tablet, Take 1 tablet (1,000 mg total) by mouth daily with breakfast., Disp: 90 tablet, Rfl: 1 .  Methylcobalamin (B12-ACTIVE) 1 MG CHEW, Chew 2 tablets by mouth daily., Disp: , Rfl:  .  Multiple Vitamin (MULTIVITAMIN) tablet, Take 1 tablet by mouth daily., Disp: , Rfl:  .  prednisoLONE acetate (PRED FORTE) 1 % ophthalmic suspension, USE 1 DROP IN RIGHT EYE 4 TIMES A DAY, Disp: , Rfl: 3 .  rosuvastatin (CRESTOR) 10 MG tablet, Take 1 tablet (10 mg total) by mouth daily., Disp: 90 tablet, Rfl: 1 .  timolol (BETIMOL) 0.5 % ophthalmic solution, 1 drop., Disp: , Rfl:  .  Vitamin D, Ergocalciferol, (DRISDOL) 50000 units CAPS capsule, Take 1 capsule (50,000 Units total) by mouth every 7 (seven) days., Disp: 12 capsule, Rfl: 0  Allergies  Allergen Reactions  . Other Itching    Nucinta     ROS  Constitutional: Negative for fever or weight change.  Respiratory: Negative for cough and shortness of breath.   Cardiovascular: Negative for chest pain or palpitations.  Gastrointestinal: Negative for abdominal pain, no bowel changes.  Musculoskeletal: Positive for gait problem but no   joint swelling.  Skin: Negative for rash.  Neurological: Negative for dizziness or headache.  No other specific complaints in a complete review of systems (except as listed in HPI above).  Objective  Vitals:   07/09/17 1104  BP: 140/70  Pulse: 64  Resp: 14  SpO2: 95%  Weight: 151 lb 1.6 oz (68.5 kg)  Height: _0  (1.651 m)    Body mass index is 25.14 kg/m.  Physical Exam  Constitutional: Patient appears well-developed and well-nourished. Overweight.  No distress.  HEENT: head atraumatic, normocephalic,neck supplE Cardiovascular: Normal rate, regular rhythm and normal heart sounds.  No murmur heard.  No BLE edema. Pulmonary/Chest: Effort normal and breath sounds normal. No respiratory distress. Abdominal: Soft.  There is no tenderness. Psychiatric: Patient has a normal mood and affect. behavior is normal. Judgment and thought content normal.  Recent Results (from the past 2160 hour(s))  POCT HgB A1C     Status: Abnormal   Collection Time: 07/09/17 11:06 AM  Result Value Ref Range   Hemoglobin A1C 7.6      PHQ2/9: Depression screen [facility] 2/9 07/09/2017 02/17/2017 09/25/2015 05/08/2015 12/05/2014  Decreased Interest 1 0 0 0 0  Down, Depressed, Hopeless 0 1 0 0 0  PHQ - 2 Score 1 1 0 0 0  Altered sleeping 0 - - - -  Tired, decreased energy 1 - - - -  Change in appetite 0 - - - -  Feeling bad or failure about yourself  0 - - - -  Trouble concentrating 0 - - - -  Moving slowly or fidgety/restless 0 - - - -  PHQ-9 Score 2 - - - -  Difficult doing work/chores Not difficult at all - - - -     Fall Risk: Fall Risk  07/09/2017 02/17/2017 09/25/2015 05/08/2015 12/05/2014  Falls in the past year? No No No No Yes  Number falls in past yr: - - - - 2 or more  Injury with Fall? - - - - No    Functional Status Survey: Is the patient deaf or have difficulty hearing?: No Does the patient have difficulty seeing, even when wearing glasses/contacts?: No Does the patient have difficulty  concentrating, remembering, or making decisions?: No Does the patient have difficulty walking or climbing stairs?: No Does the patient have difficulty dressing or bathing?: No Does the patient have difficulty doing errands alone such as visiting a doctor's office or shopping?: No    Assessment & Plan  1. Uncontrolled type 2 diabetes with eye complications (HCC)  - POCT HgB A1C - metformin (FORTAMET) 1000 MG (OSM) 24 hr tablet; Take 1 tablet (1,000 mg total) by mouth daily with breakfast.  Dispense: 90 tablet; Refill: 1  2. Type 2 diabetes mellitus with peripheral neuropathy (HCC)  - POCT HgB A1C - metformin (FORTAMET) 1000 MG (OSM) 24 hr tablet; Take 1 tablet (1,000 mg total) by mouth daily with breakfast.  Dispense: 90 tablet; Refill: 1  3. Visit for screening mammogram  - MM Digital Screening  4. Benign essential HTN  - losartan (COZAAR) 25 MG tablet; Take 1 tablet (25 mg total) by mouth daily.  Dispense: 90 tablet; Refill: 1  5. Phantom limb ([place])  stable  6. Depression, major, recurrent, mild (HCC)  - citalopram (CELEXA) 20 MG tablet; Take 1 tablet (20 mg total) by mouth daily.  Dispense: 90 tablet; Refill: 1  7. Peripheral artery disease (HCC)  - clopidogrel (PLAVIX) 75 MG tablet; Take 1 tablet (75 mg total) by mouth daily.  Dispense: 90 tablet; Refill: 1  8. Vitamin B12 deficiency   9. Vitamin D deficiency   10. Amputated toe, left (HCC)  Stable  11. Dyslipidemia  - rosuvastatin (CRESTOR) 10 MG tablet; Take 1 tablet (10 mg total) by mouth daily.  Dispense: 90 tablet; Refill: 1  12. URI, acute  Take Coricidin HBP  13. Urge incontinence  - Urine Culture

## 2017-07-10 LAB — URINE CULTURE
MICRO NUMBER:: 90201026
Result:: NO GROWTH
SPECIMEN QUALITY:: ADEQUATE

## 2017-08-03 DIAGNOSIS — H401121 Primary open-angle glaucoma, left eye, mild stage: Secondary | ICD-10-CM | POA: Diagnosis not present

## 2017-08-11 DIAGNOSIS — H401121 Primary open-angle glaucoma, left eye, mild stage: Secondary | ICD-10-CM | POA: Diagnosis not present

## 2017-08-11 LAB — HM DIABETES EYE EXAM

## 2017-08-12 ENCOUNTER — Encounter: Payer: Self-pay | Admitting: Family Medicine

## 2017-12-15 ENCOUNTER — Other Ambulatory Visit: Payer: Self-pay | Admitting: Family Medicine

## 2017-12-15 DIAGNOSIS — E559 Vitamin D deficiency, unspecified: Secondary | ICD-10-CM

## 2018-02-21 ENCOUNTER — Other Ambulatory Visit: Payer: Self-pay | Admitting: Family Medicine

## 2018-02-21 DIAGNOSIS — E785 Hyperlipidemia, unspecified: Secondary | ICD-10-CM

## 2018-02-23 DIAGNOSIS — H401113 Primary open-angle glaucoma, right eye, severe stage: Secondary | ICD-10-CM | POA: Diagnosis not present

## 2018-03-16 ENCOUNTER — Ambulatory Visit (INDEPENDENT_AMBULATORY_CARE_PROVIDER_SITE_OTHER): Payer: Medicare Other | Admitting: Family Medicine

## 2018-03-16 ENCOUNTER — Encounter: Payer: Self-pay | Admitting: Family Medicine

## 2018-03-16 VITALS — BP 138/82 | HR 110 | Temp 97.9°F | Resp 18 | Ht 65.0 in | Wt 145.0 lb

## 2018-03-16 DIAGNOSIS — I739 Peripheral vascular disease, unspecified: Secondary | ICD-10-CM | POA: Diagnosis not present

## 2018-03-16 DIAGNOSIS — E538 Deficiency of other specified B group vitamins: Secondary | ICD-10-CM

## 2018-03-16 DIAGNOSIS — Z23 Encounter for immunization: Secondary | ICD-10-CM

## 2018-03-16 DIAGNOSIS — E559 Vitamin D deficiency, unspecified: Secondary | ICD-10-CM

## 2018-03-16 DIAGNOSIS — F330 Major depressive disorder, recurrent, mild: Secondary | ICD-10-CM

## 2018-03-16 DIAGNOSIS — E1142 Type 2 diabetes mellitus with diabetic polyneuropathy: Secondary | ICD-10-CM

## 2018-03-16 DIAGNOSIS — E118 Type 2 diabetes mellitus with unspecified complications: Secondary | ICD-10-CM

## 2018-03-16 DIAGNOSIS — N3941 Urge incontinence: Secondary | ICD-10-CM | POA: Diagnosis not present

## 2018-03-16 DIAGNOSIS — E785 Hyperlipidemia, unspecified: Secondary | ICD-10-CM

## 2018-03-16 DIAGNOSIS — E1121 Type 2 diabetes mellitus with diabetic nephropathy: Secondary | ICD-10-CM | POA: Diagnosis not present

## 2018-03-16 DIAGNOSIS — I10 Essential (primary) hypertension: Secondary | ICD-10-CM | POA: Diagnosis not present

## 2018-03-16 DIAGNOSIS — G547 Phantom limb syndrome without pain: Secondary | ICD-10-CM

## 2018-03-16 DIAGNOSIS — S98132A Complete traumatic amputation of one left lesser toe, initial encounter: Secondary | ICD-10-CM | POA: Diagnosis not present

## 2018-03-16 LAB — POCT GLYCOSYLATED HEMOGLOBIN (HGB A1C): HbA1c, POC (controlled diabetic range): 7.1 % — AB (ref 0.0–7.0)

## 2018-03-16 MED ORDER — CITALOPRAM HYDROBROMIDE 20 MG PO TABS: 20.0000 mg | ORAL_TABLET | Freq: Every day | ORAL | 1 refills | Status: DC

## 2018-03-16 MED ORDER — METFORMIN HCL ER (OSM) 1000 MG PO TB24: 1000.0000 mg | ORAL_TABLET | Freq: Every day | ORAL | 1 refills | Status: DC

## 2018-03-16 MED ORDER — ROSUVASTATIN CALCIUM 10 MG PO TABS: 10.0000 mg | ORAL_TABLET | Freq: Every day | ORAL | 1 refills | Status: DC

## 2018-03-16 MED ORDER — CLOPIDOGREL BISULFATE 75 MG PO TABS: 75.0000 mg | ORAL_TABLET | Freq: Every day | ORAL | 1 refills | Status: DC

## 2018-03-16 MED ORDER — LOSARTAN POTASSIUM 25 MG PO TABS: 25.0000 mg | ORAL_TABLET | Freq: Every day | ORAL | 1 refills | Status: DC

## 2018-03-16 NOTE — Progress Notes (Signed)
Name: Lisa [name]   MRN: [id number]    DOB: [birth year]   Date:03/16/2018       Progress Note  Subjective  Chief Complaint  Chief Complaint  Patient presents with  . Medication Refill    8 month F/U  . Hypertension  . Diabetes  . Dyslipidemia  . Depression  . Peripheral Neuropathy  . Urinary Incontinence    Still have troubling with frequency and incontinence    HPI  DMII: hgbA1C was  7.7%, 7.6%and again at 7.6% and today 7.1% she is off Glipizide daily, but compliant with Metformin..No recent episodes of hypoglycemia.Taking ARB for kidney protection. She sees vascular surgeon and is on Plavix. She does not want to take medication for phanton syndrome. She denies polyphagia but states she likes to drink water and has urinary frequency, really bothered by urinary urgency and has to wear depends now.   Urinary incontinence: going on for over one year but getting worse, states now has to wear depends at night, last visit urine culture was negative. She states Crestor and Plavix can cause urinary symptoms but explained to her not very common and the two medications are important for her to continue taking. She denies  dysuria or hematuria. She denies stress symptoms, it is mostly urgency   HTN: taking medications, denies side effects. No chest pain or palpitation . BP is well controlled today, she checks bp at home and is within normal limits.   Major Depression in remission she Is doing well on Citalopram, she states she is doing well at this time. No crying spells. No suicidal thoughts or ideation.   Vitamin B12 and Vitamin D deficiency: she is back taking supplementations, also takes B12 supplementation   Peripheral neuropathy and phanton pain: she states pain is continuous, pain is constant, right now it is a 3-4/10 She does not want to take any prescription medication at this time she will continue to take prn Tylenol  PVD: she sees  Dr. Lucky Cowboy, she is on Plavix, s/p  amputation. She no longer takes Ultram but sometimes the pain can be intense on her left foot. She does not like taking medications  Dyslipidemia: taking Crestor and denies myalgia. No chest pain. Time to recheck lipid panel   Blind right eye: sees ophthalmologist ,on 6 months follow up, she has cataracts now but not having surgery   Patient Active Problem List   Diagnosis Date Noted  . Uncontrolled type 2 diabetes with eye complications ([place]) 01/74/9449  . DM (diabetes mellitus), type 2 with peripheral vascular complications ([place]) 67/59/1638  . Amputated toe ([place]) 12/02/2014  . Benign essential HTN 12/02/2014  . Depression, major, recurrent, mild ([place]) 12/02/2014  . Dyslipidemia 12/02/2014  . Vitamin B12 deficiency 12/02/2014  . Peripheral neuropathic pain 12/02/2014  . Peripheral artery disease ([place]) 12/02/2014  . Phantom limb ([place]) 12/02/2014  . Diabetes mellitus with nephropathy ([place]) 12/02/2014  . Detached retina 12/02/2014  . Type 2 diabetes mellitus with peripheral neuropathy ([place]) 12/02/2014  . Tobacco use 12/02/2014  . Urge incontinence 12/02/2014  . 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  . Vitamin D deficiency 12/02/2014    Past Surgical History:  Procedure Laterality Date  . ABDOMINAL HYSTERECTOMY    . EYE SURGERY    . TOE AMPUTATION Left 2011   all toes  . VASCULAR SURGERY      Family History  Problem Relation Age of Onset  . Diabetes Mother   . Diabetes Sister   . Diabetes Sister  Social History   Socioeconomic History  . Marital status: Widowed    Spouse name: Not on file  . Number of children: Not on file  . Years of education: Not on file  . Highest education level: Not on file  Occupational History  . Not on file  Social Needs  . Financial resource strain: Not on file  . Food insecurity:    Worry: Not on file    Inability: Not on file  . Transportation needs:    Medical: Not on file    Non-medical: Not on file  Tobacco Use  . Smoking status:  Current Every Day Smoker    Packs/day: 0.50    Years: 18.00    Pack years: 9.00  . Smokeless tobacco: Never Used  Substance and Sexual Activity  . Alcohol use: No    Alcohol/week: 0.0 standard drinks  . Drug use: No  . Sexual activity: Not Currently  Lifestyle  . Physical activity:    Days per week: Not on file    Minutes per session: Not on file  . Stress: Not on file  Relationships  . Social connections:    Talks on phone: Not on file    Gets together: Not on file    Attends religious service: Not on file    Active member of club or organization: Not on file    Attends meetings of clubs or organizations: Not on file    Relationship status: Not on file  . Intimate partner violence:    Fear of current or ex partner: Not on file    Emotionally abused: Not on file    Physically abused: Not on file    Forced sexual activity: Not on file  Other Topics Concern  . Not on file  Social History Narrative  . Not on file     Current Outpatient Medications:  .  aspirin 81 MG tablet, Take 81 mg by mouth daily., Disp: , Rfl:  .  atropine 1 % ophthalmic solution, Place 1 drop into the right eye 3 (three) times daily., Disp: , Rfl:  .  blood glucose meter kit and supplies, , Disp: , Rfl:  .  brimonidine (ALPHAGAN) 0.2 % ophthalmic solution, USE 1 DROP IN BOTH EYE TWICE A DAY, Disp: , Rfl: 3 .  citalopram (CELEXA) 20 MG tablet, Take 1 tablet (20 mg total) by mouth daily., Disp: 90 tablet, Rfl: 1 .  clopidogrel (PLAVIX) 75 MG tablet, Take 1 tablet (75 mg total) by mouth daily., Disp: 90 tablet, Rfl: 1 .  dorzolamide-timolol (COSOPT) 22.3-6.8 MG/ML ophthalmic solution, USE 1 DROP IN BOTH EYES 2 TIMES A DAY, Disp: , Rfl: 4 .  losartan (COZAAR) 25 MG tablet, Take 1 tablet (25 mg total) by mouth daily., Disp: 90 tablet, Rfl: 1 .  metformin (FORTAMET) 1000 MG (OSM) 24 hr tablet, Take 1 tablet (1,000 mg total) by mouth daily with breakfast., Disp: 90 tablet, Rfl: 1 .  Methylcobalamin  (B12-ACTIVE) 1 MG CHEW, Chew 2 tablets by mouth daily., Disp: , Rfl:  .  Multiple Vitamin (MULTIVITAMIN) tablet, Take 1 tablet by mouth daily., Disp: , Rfl:  .  prednisoLONE acetate (PRED FORTE) 1 % ophthalmic suspension, USE 1 DROP IN RIGHT EYE 4 TIMES A DAY, Disp: , Rfl: 3 .  rosuvastatin (CRESTOR) 10 MG tablet, Take 1 tablet (10 mg total) by mouth daily., Disp: 90 tablet, Rfl: 1 .  timolol (BETIMOL) 0.5 % ophthalmic solution, 1 drop., Disp: , Rfl:  .  Vitamin D, Ergocalciferol, (DRISDOL) 50000 units CAPS capsule, Take 1 capsule (50,000 Units total) by mouth every 7 (seven) days., Disp: 12 capsule, Rfl: 0  Allergies  Allergen Reactions  . Other Itching    Nucinta    I personally reviewed active problem list, medication list, allergies, family history, social history, health maintenance with the patient/caregiver today.   ROS  Constitutional: Negative for fever or weight change.  Respiratory: Negative for cough and shortness of breath.   Cardiovascular: Negative for chest pain or palpitations.  Gastrointestinal: Negative for abdominal pain, no bowel changes.  Musculoskeletal: Negative for gait problem or joint swelling.  Skin: Negative for rash.  Neurological: Negative for dizziness or headache.  No other specific complaints in a complete review of systems (except as listed in HPI above).  Objective  Vitals:   03/16/18 1401  BP: 138/82  Pulse: (!) 110  Resp: 18  Temp: 97.9 F (36.6 C)  TempSrc: Oral  SpO2: 92%  Weight: 145 lb (65.8 kg)  Height: _0  (1.651 m)    Body mass index is 24.13 kg/m.  Physical Exam  Constitutional: Patient appears well-developed and well-nourished.  No distress.  HEENT: head atraumatic, normocephalic,neck supple, throat within normal limits Cardiovascular: Normal rate, regular rhythm and normal heart sounds.  No murmur heard. No BLE edema. Pulmonary/Chest: Effort normal and breath sounds normal. No respiratory distress. Abdominal: Soft.   There is no tenderness. Psychiatric: Patient has a normal mood and affect. behavior is normal. Judgment and thought content normal. Muscular Skeletal: walks without cane , s/p partial foot amputation left side   Recent Results (from the past 2160 hour(s))  POCT HgB A1C     Status: Abnormal   Collection Time: 03/16/18  2:07 PM  Result Value Ref Range   Hemoglobin A1C     HbA1c POC (<> result, manual entry)     HbA1c, POC (prediabetic range)     HbA1c, POC (controlled diabetic range) 7.1 (A) 0.0 - 7.0 %    Diabetic Foot Exam: Diabetic Foot Exam - Simple   Simple Foot Form Diabetic Foot exam was performed with the following findings:  Yes 03/16/2018  2:51 PM  Visual Inspection See comments:  Yes Sensation Testing Intact to touch and monofilament testing bilaterally:  Yes Pulse Check See comments:  Yes Comments Weaker pedis dorsalis, on both feet, left worse than right S/p forefoot amputation , stump in good aspect       PHQ2/9: Depression screen [facility] 2/9 03/16/2018 07/09/2017 02/17/2017 09/25/2015 05/08/2015  Decreased Interest 1 1 0 0 0  Down, Depressed, Hopeless 0 0 1 0 0  PHQ - 2 Score _1 0 0  Altered sleeping 0 0 - - -  Tired, decreased energy 1 1 - - -  Change in appetite 1 0 - - -  Feeling bad or failure about yourself  0 0 - - -  Trouble concentrating 0 0 - - -  Moving slowly or fidgety/restless 0 0 - - -  Suicidal thoughts 0 - - - -  PHQ-9 Score 3 2 - - -  Difficult doing work/chores Not difficult at all Not difficult at all - - -     Fall Risk: Fall Risk  03/16/2018 07/09/2017 02/17/2017 09/25/2015 05/08/2015  Falls in the past year? Yes No No No No  Number falls in past yr: 2 or more - - - -  Injury with Fall? Yes - - - -  Comment right leg- hit night stand  after falling out of bed - - - -     Functional Status Survey: Is the patient deaf or have difficulty hearing?: No Does the patient have difficulty seeing, even when wearing glasses/contacts?: Yes(Blind  in right eye) Does the patient have difficulty concentrating, remembering, or making decisions?: No Does the patient have difficulty walking or climbing stairs?: Yes Does the patient have difficulty dressing or bathing?: No Does the patient have difficulty doing errands alone such as visiting a doctor's office or shopping?: No    Assessment & Plan  1. Diabetes mellitus type 2 with complications (HCC)  - POCT HgB A1C - POCT UA - Microalbumin - metformin (FORTAMET) 1000 MG (OSM) 24 hr tablet; Take 1 tablet (1,000 mg total) by mouth daily with breakfast.  Dispense: 90 tablet; Refill: 1  2. Benign essential HTN  - COMPLETE METABOLIC PANEL WITH GFR - losartan (COZAAR) 25 MG tablet; Take 1 tablet (25 mg total) by mouth daily.  Dispense: 90 tablet; Refill: 1  3. Phantom limb ([place])   4. Type 2 diabetes mellitus with peripheral neuropathy (HCC)  - metformin (FORTAMET) 1000 MG (OSM) 24 hr tablet; Take 1 tablet (1,000 mg total) by mouth daily with breakfast.  Dispense: 90 tablet; Refill: 1  5. Vitamin B12 deficiency  - CBC with Differential/Platelet - Vitamin B12  6. Vitamin D deficiency  - VITAMIN D 25 Hydroxy (Vit-D Deficiency, Fractures)  7. Amputated toe, left ([place])   8. Dyslipidemia  - Lipid panel - rosuvastatin (CRESTOR) 10 MG tablet; Take 1 tablet (10 mg total) by mouth daily.  Dispense: 90 tablet; Refill: 1  9. Diabetes mellitus with nephropathy (HCC)  - Hemoglobin A1c  10. Urge incontinence  - Ambulatory referral to Urology  She states during her last visit I told her she needed HD, explained it was likely a miscommunications since her GFR was normal, I likely said she needs medications. Urine culture was negative, symptoms are getting worse , refuses medication, advised follow up with urologist   11. Peripheral artery disease (HCC)  - clopidogrel (PLAVIX) 75 MG tablet; Take 1 tablet (75 mg total) by mouth daily.  Dispense: 90 tablet; Refill: 1  12. Needs flu  shot  - Flu vaccine HIGH DOSE PF  13. Depression, major, recurrent, mild (HCC)  - citalopram (CELEXA) 20 MG tablet; Take 1 tablet (20 mg total) by mouth daily.  Dispense: 90 tablet; Refill: 1

## 2018-03-17 LAB — LIPID PANEL
Cholesterol: 133 mg/dL (ref <200)
HDL: 59 mg/dL (ref >50)
LDL Cholesterol (Calc): 56 mg/dL (calc)
NON-HDL CHOLESTEROL (CALC): 74 mg/dL (ref <130)
Total CHOL/HDL Ratio: 2.3 (calc) (ref <5.0)
Triglycerides: 92 mg/dL (ref <150)

## 2018-03-17 LAB — COMPLETE METABOLIC PANEL WITH GFR
AG RATIO: 1.7 (calc) (ref 1.0–2.5)
ALT: 10 U/L (ref 6–29)
AST: 15 U/L (ref 10–35)
Albumin: 4 g/dL (ref 3.6–5.1)
Alkaline phosphatase (APISO): 70 U/L (ref 33–130)
BILIRUBIN TOTAL: 0.5 mg/dL (ref 0.2–1.2)
BUN: 12 mg/dL (ref 7–25)
CALCIUM: 9.6 mg/dL (ref 8.6–10.4)
CHLORIDE: 107 mmol/L (ref 98–110)
CO2: 29 mmol/L (ref 20–32)
Creat: 0.89 mg/dL (ref 0.60–0.93)
GFR, EST AFRICAN AMERICAN: 75 mL/min/{1.73_m2} (ref >=60)
GFR, EST NON AFRICAN AMERICAN: 65 mL/min/{1.73_m2} (ref >=60)
GLOBULIN: 2.3 g/dL (ref 1.9–3.7)
Glucose, Bld: 122 mg/dL — ABNORMAL HIGH (ref 65–99)
POTASSIUM: 4.6 mmol/L (ref 3.5–5.3)
SODIUM: 143 mmol/L (ref 135–146)
Total Protein: 6.3 g/dL (ref 6.1–8.1)

## 2018-03-17 LAB — CBC WITH DIFFERENTIAL/PLATELET
BASOS ABS: 19 {cells}/uL (ref 0–200)
Basophils Relative: 0.3 %
EOS ABS: 32 {cells}/uL (ref 15–500)
Eosinophils Relative: 0.5 %
HCT: 38 % (ref 35.0–45.0)
HEMOGLOBIN: 12.3 g/dL (ref 11.7–15.5)
LYMPHS ABS: 1355 {cells}/uL (ref 850–3900)
MCH: 27 pg (ref 27.0–33.0)
MCHC: 32.4 g/dL (ref 32.0–36.0)
MCV: 83.3 fL (ref 80.0–100.0)
MPV: 12.2 fL (ref 7.5–12.5)
Monocytes Relative: 8.6 %
NEUTROS ABS: 4353 {cells}/uL (ref 1500–7800)
Neutrophils Relative %: 69.1 %
Platelets: 266 10*3/uL (ref 140–400)
RBC: 4.56 10*6/uL (ref 3.80–5.10)
RDW: 15.2 % — AB (ref 11.0–15.0)
Total Lymphocyte: 21.5 %
WBC mixed population: 542 cells/uL (ref 200–950)
WBC: 6.3 10*3/uL (ref 3.8–10.8)

## 2018-03-17 LAB — VITAMIN D 25 HYDROXY (VIT D DEFICIENCY, FRACTURES): Vit D, 25-Hydroxy: 26 ng/mL — ABNORMAL LOW (ref 30–100)

## 2018-03-17 LAB — MICROALBUMIN / CREATININE URINE RATIO
CREATININE, URINE: 168 mg/dL (ref 20–275)
MICROALB UR: 17 mg/dL
Microalb Creat Ratio: 101 mcg/mg creat — ABNORMAL HIGH (ref <30)

## 2018-03-17 LAB — VITAMIN B12: VITAMIN B 12: 594 pg/mL (ref 200–1100)

## 2018-03-17 LAB — HEMOGLOBIN A1C
Hgb A1c MFr Bld: 7.5 % of total Hgb — ABNORMAL HIGH (ref <5.7)
MEAN PLASMA GLUCOSE: 169 (calc)
eAG (mmol/L): 9.3 (calc)

## 2018-03-19 ENCOUNTER — Telehealth: Payer: Self-pay | Admitting: Family Medicine

## 2018-03-19 NOTE — Telephone Encounter (Signed)
See result note.  

## 2018-03-19 NOTE — Telephone Encounter (Signed)
Pt returning call.  Copied from CRM [id number]. Topic: Quick Communication - Lab Results (Clinic Use ONLY) >> Mar 19, 2018  9:29 AM Lisa [name], CMA wrote: Called patient to inform them of most lab results. When patient returns call, triage nurse may disclose results.

## 2018-03-25 ENCOUNTER — Ambulatory Visit
Admission: RE | Admit: 2018-03-25 | Discharge: 2018-03-25 | Disposition: A | Discharge status: Home / Self Care | Payer: Medicare Other | Source: Ambulatory Visit | Attending: Family Medicine | Admitting: Family Medicine

## 2018-03-25 DIAGNOSIS — Z1231 Encounter for screening mammogram for malignant neoplasm of breast: Secondary | ICD-10-CM | POA: Insufficient documentation

## 2018-04-04 ENCOUNTER — Other Ambulatory Visit: Payer: Self-pay | Admitting: Family Medicine

## 2018-04-04 DIAGNOSIS — E559 Vitamin D deficiency, unspecified: Secondary | ICD-10-CM

## 2018-04-07 ENCOUNTER — Encounter: Payer: Self-pay | Admitting: Urology

## 2018-04-07 ENCOUNTER — Telehealth: Payer: Self-pay | Admitting: Urology

## 2018-04-07 ENCOUNTER — Ambulatory Visit (INDEPENDENT_AMBULATORY_CARE_PROVIDER_SITE_OTHER): Payer: Medicare Other | Admitting: Urology

## 2018-04-07 VITALS — BP 176/80 | HR 88 | Ht 64.5 in | Wt 143.7 lb

## 2018-04-07 DIAGNOSIS — N3941 Urge incontinence: Secondary | ICD-10-CM | POA: Diagnosis not present

## 2018-04-07 DIAGNOSIS — N952 Postmenopausal atrophic vaginitis: Secondary | ICD-10-CM | POA: Diagnosis not present

## 2018-04-07 LAB — URINALYSIS, COMPLETE
Bilirubin, UA: NEGATIVE
GLUCOSE, UA: NEGATIVE
KETONES UA: NEGATIVE
LEUKOCYTES UA: NEGATIVE
Nitrite, UA: NEGATIVE
PH UA: 6 (ref 5.0–7.5)
RBC UA: NEGATIVE
UUROB: 1 mg/dL (ref 0.2–1.0)

## 2018-04-07 LAB — MICROSCOPIC EXAMINATION
BACTERIA UA: NONE SEEN
RBC, UA: NONE SEEN /hpf (ref 0–2)
WBC, UA: NONE SEEN /hpf (ref 0–5)

## 2018-04-07 LAB — BLADDER SCAN AMB NON-IMAGING

## 2018-04-07 NOTE — Progress Notes (Addendum)
April 07, 2018 6:04 AM   Lisa [name] [birth year] [id number]  Referring provider: Steele Sizer, MD [address] [name], [name] [address]  No chief complaint on file.   HPI: Lisa [name] is a 72 yo F who presents today for management and evaluation of urge incontinence as referral from Dr. Steele Sizer.    The patient reports having issues with the bladder for the past 6 months. She has not sought medical intervention. She reports urinating 6-8 times a day for the past week, which is improved from previously. She reports urgency. Pt urinates 3-4 times a night and urgency is worse at night. The pt reports waking up 3 times a week "wetting the bed" and wears a pad at night. Pt does not wear pads during the day. Patient denies any gross hematuria, dysuria or suprapubic/flank pain.  Patient denies any fevers, chills, nausea or vomiting.   Pt denies history of kidney stones or UTIs and pt had a total hysterectomy. Pt reports being a smoker and drinks about 16-24 oz of water a day, about 1 cup coffee in the morning, drinks 1 bottle of soda throughout the day, and drinks tea once a week. Pt does not drink any juice, energy drinks, and rarely drinks alcohol. Pt does not have leakage when laughing, coughing or sneezing.   Her PVR today was 0 mL.   PMH: Past Medical History:  Diagnosis Date  . Blindness of right eye   . Depression   . Diabetes mellitus without complication ([place])   . DVT of leg (deep venous thrombosis) ([place])   . Glaucoma   . Hyperlipidemia   . Hypertension   . Vascular disease     Surgical History: Past Surgical History:  Procedure Laterality Date  . ABDOMINAL HYSTERECTOMY  1992   Total  . EYE SURGERY    . TOE AMPUTATION Left 2011   All of her toes on the left foot  . VASCULAR SURGERY      Home Medications:  Allergies as of 04/07/2018      Reactions   Other Itching   Nucinta   Tapentadol Hcl       Medication List        Accurate  as of 04/07/18 11:59 PM. Always use your most recent med list.          aspirin 81 MG tablet Take 81 mg by mouth daily.   atropine 1 % ophthalmic solution Place 1 drop into the right eye 3 (three) times daily.   B12-ACTIVE 1 MG Chew Generic drug:  Methylcobalamin Chew 2 tablets by mouth daily.   blood glucose meter kit and supplies   brimonidine 0.2 % ophthalmic solution Commonly known as:  ALPHAGAN USE 1 DROP IN BOTH EYE TWICE A DAY   citalopram 20 MG tablet Commonly known as:  CELEXA Take 1 tablet (20 mg total) by mouth daily.   clopidogrel 75 MG tablet Commonly known as:  PLAVIX Take 1 tablet (75 mg total) by mouth daily.   dorzolamide-timolol 22.3-6.8 MG/ML ophthalmic solution Commonly known as:  COSOPT USE 1 DROP IN BOTH EYES 2 TIMES A DAY   losartan 25 MG tablet Commonly known as:  COZAAR Take 1 tablet (25 mg total) by mouth daily.   metformin 1000 MG (OSM) 24 hr tablet Commonly known as:  FORTAMET Take 1 tablet (1,000 mg total) by mouth daily with breakfast.   multivitamin tablet Take 1 tablet by mouth daily.   prednisoLONE acetate 1 %  ophthalmic suspension Commonly known as:  PRED FORTE USE 1 DROP IN RIGHT EYE 4 TIMES A DAY   rosuvastatin 10 MG tablet Commonly known as:  CRESTOR Take 1 tablet (10 mg total) by mouth daily.   timolol 0.5 % ophthalmic solution Commonly known as:  BETIMOL 1 drop.   Vitamin D (Ergocalciferol) 1.25 MG (50000 UT) Caps capsule Commonly known as:  DRISDOL TAKE 1 CAPSULE (50,000 UNITS TOTAL) BY MOUTH EVERY 7 (SEVEN) DAYS.       Allergies:  Allergies  Allergen Reactions  . Other Itching    Nucinta  . Tapentadol Hcl     Family History: Family History  Problem Relation Age of Onset  . Heart disease Father   . Diabetes Mother   . Diabetes Brother   . Kidney disease Brother        Transplant  . Heart disease Brother   . Diabetes Sister   . Diabetes Sister     Social History:  reports that she has been  smoking cigarettes. She started smoking about 18 years ago. She has a 9.00 pack-year smoking history. She has never used smokeless tobacco. She reports that she does not drink alcohol or use drugs.  ROS: UROLOGY Frequent Urination?: Yes Hard to postpone urination?: Yes Burning/pain with urination?: No Get up at night to urinate?: Yes Leakage of urine?: No Urine stream starts and stops?: No Trouble starting stream?: No Do you have to strain to urinate?: No Blood in urine?: No Urinary tract infection?: No Sexually transmitted disease?: No Injury to kidneys or bladder?: No Painful intercourse?: No Weak stream?: No Currently pregnant?: No Vaginal bleeding?: No  Gastrointestinal Nausea?: No Vomiting?: No Indigestion/heartburn?: No Diarrhea?: No Constipation?: No  Constitutional Fever: No Night sweats?: No Weight loss?: No Fatigue?: Yes  Skin Skin rash/lesions?: Yes Itching?: Yes  Eyes Blurred vision?: Yes Double vision?: Yes  Ears/Nose/Throat Sore throat?: No Sinus problems?: No  Hematologic/Lymphatic Swollen glands?: No Easy bruising?: Yes  Cardiovascular Leg swelling?: No Chest pain?: Yes  Respiratory Cough?: No Shortness of breath?: Yes  Endocrine Excessive thirst?: Yes  Musculoskeletal Back pain?: No Joint pain?: No  Neurological Headaches?: No Dizziness?: No  Psychologic Depression?: Yes Anxiety?: No  Physical Exam: BP (!) 176/80 (BP Location: Left Arm, Patient Position: Sitting, Cuff Size: Normal)   Pulse 88   Ht 5' 4.5" (1.638 m)   Wt 143 lb 11.2 oz (65.2 kg)   BMI 24.29 kg/m   Constitutional: Well nourished. Alert and oriented, No acute distress. HEENT: [place] AT, moist mucus membranes. Trachea midline, no masses. Cardiovascular: No clubbing, cyanosis, or edema. Respiratory: Normal respiratory effort, no increased work of breathing. GI: Abdomen is soft, non tender, non distended, no abdominal masses. Liver and spleen not palpable.   No hernias appreciated.  Stool sample for occult testing is not indicated.   GU: No CVA tenderness.  No bladder fullness or masses. Atrophic external genitalia, sparse pubic hair distribution, no lesions.  Normal urethral meatus, no lesions, no prolapse, no discharge.   No urethral masses, tenderness and/or tenderness. No bladder fullness, tenderness or masses. Pale vagina mucosa, poor estrogen effect, no discharge, no lesions, fair pelvic support, Grade 1 cystocele and rectocele noted.  Cervix, uterus and adnexa are surgically absent. Uterus is freely mobile and non-fixed. No adnexal/parametria masses or tenderness noted.  Anus and perineum are without rashes or lesions. Skin: No rashes, bruises or suspicious lesions. Lymph: No cervical or inguinal adenopathy. Neurologic: Grossly intact, no focal deficits, moving all 4  extremities, left toes amputated.  Psychiatric: Normal mood and affect.  Laboratory Data: Lab Results  Component Value Date   WBC 6.3 03/16/2018   HGB 12.3 03/16/2018   HCT 38.0 03/16/2018   MCV 83.3 03/16/2018   PLT 266 03/16/2018    Lab Results  Component Value Date   CREATININE 0.89 03/16/2018    No results found for: PSA  No results found for: TESTOSTERONE  Lab Results  Component Value Date   HGBA1C 7.5 (H) 03/16/2018    Urinalysis 2+ protein.  See Epic.  I have reviewed the labs.  Pertinent Imaging: Results for JOLEEN, STUCKERT (MRN [id number]) as of 04/08/2018 06:04  Ref. Range 04/07/2018 13:37  Scan Result Unknown 0 ml    Assessment & Plan:    1.Urge Incontinence  -Discussed avoiding caffeine, sugary drinks, and diet drinks and other bladder irritants  -Discussed timed voids  -Offered pt referral to physical therapy but she deferred till after holidays since she is financially strapped  -Discussed medications: anticholingerics and their side effect profiles and Mybetriq and their side effect profiles. Pt deferred medications untill after holidays  due to financial issues   2. Vaginal Atrophy - Discussed the pathophysiology of vaginal atrophy with pt and the associated treatment, estrogen cream which the pt deferred due to financial issues.   No follow-ups on file.  Zara Council, PA-C  [facility] [address] [place], [place] [address] [phone]  I, Lucas Mallow, am acting as a Education administrator for [employer],  I have reviewed the above documentation for accuracy and completeness, and I agree with the above.    Zara Council, PA-C

## 2018-04-07 NOTE — Telephone Encounter (Signed)
Unable to leave message, no voicemail set up.

## 2018-04-07 NOTE — Telephone Encounter (Signed)
Please let Mrs. Lisa [name] know that her urine was positive for protein which is likely causing the foamy urine.  Dr. Carlynn PurlSowles is aware of this and has been monitoring her for this condition.  She is on the appropriate BP medication that helps address this issue.  Her diabetes is the most likely culprit for the protein in her urine and not the rosuvastatin.

## 2018-04-09 NOTE — Telephone Encounter (Signed)
Patient called back said she was returning a call to the nurse.   Lisa [name]

## 2018-04-09 NOTE — Telephone Encounter (Signed)
Left message for patient to call office regarding her urine and [facility].

## 2018-04-09 NOTE — Telephone Encounter (Signed)
Pt returning missed call. Please return call. Thanks

## 2018-04-12 NOTE — Telephone Encounter (Signed)
Patient notified and voiced understanding.

## 2018-06-02 ENCOUNTER — Other Ambulatory Visit: Payer: Self-pay | Admitting: Family Medicine

## 2018-06-02 ENCOUNTER — Telehealth: Payer: Self-pay

## 2018-06-02 MED ORDER — METFORMIN HCL ER 750 MG PO TB24: 1500.0000 mg | ORAL_TABLET | Freq: Every day | ORAL | 0 refills | Status: DC

## 2018-06-02 NOTE — Telephone Encounter (Signed)
Patient was denied Metfomin(Fortamet). Preferred formulary products are: Metformin, Metformin ext-rel. Please prescribed one of other first per [facility].

## 2018-06-02 NOTE — Telephone Encounter (Signed)
I had to change dose to 750 mg take two daily to be generic

## 2018-06-03 NOTE — Telephone Encounter (Signed)
Called patient to inform her of change in Metformin. No answer.

## 2018-06-16 ENCOUNTER — Ambulatory Visit: Payer: Self-pay | Admitting: *Deleted

## 2018-06-16 NOTE — Telephone Encounter (Signed)
Runny nose, productive cough, achy all over that started 2-3 days ago. She has not checked her temperature. Reports getting winded with exertion. Brother lives with her and was diagnosed with the flu on Monday. Phlegm is thick but clear-like at this time. Legs and arms feel achy. Denies difficulty breathing. Discussed OTC cough, antihistamine and expectorant along with advil. Reviewed worsening symptoms she would need to call back for appointment.   Reason for Disposition . Care advice for mild cough, questions about  Answer Assessment - Initial Assessment Questions 1. ONSET: "When did the nasal discharge start?"      2-3 days ago-runny nose, no congestion 2. AMOUNT: "How much discharge is there?"      Constantly runny now 3. COUGH: "Do you have a cough?" If yes, ask: "Describe the color of your sputum" (clear, white, yellow, green)     Yes, productive cough with clear-like phlegm 4. RESPIRATORY DISTRESS: "Describe your breathing."      No wheezing 5. FEVER: "Do you have a fever?" If so, ask: "What is your temperature, how was it measured, and when did it start?"     She hasn't checked her temperature and not reporting a fever 6. SEVERITY: "Overall, how bad are you feeling right now?" (e.g., doesn't interfere with normal activities, staying home from school/work, staying in bed)     Mild to moderate feeling bad. 7. OTHER SYMPTOMS: "Do you have any other symptoms?" (e.g., sore throat, earache, wheezing, vomiting)     Cough achy arms and legs 8. PREGNANCY: "Is there any chance you are pregnant?" "When was your last menstrual period?"     no  Protocols used: COMMON COLD-A-AH

## 2018-06-17 ENCOUNTER — Ambulatory Visit: Payer: Self-pay

## 2018-06-17 NOTE — Telephone Encounter (Signed)
Pt. Asking if she can Theraflu and IBU 800 mg. Instructed her that she should try Coricidin HPB and Tylenol. Has Congestion and body aches. Instructed if symptoms persist, to call back. Verbalizes understanding.   Answer Assessment - Initial Assessment Questions 1. SYMPTOMS: "Do you have any symptoms?"     Wants to know if she can take Theraflu and Ibu.  2. SEVERITY: If symptoms are present, ask "Are they mild, moderate or severe?"     No symptoms  Protocols used: MEDICATION QUESTION CALL-A-AH

## 2018-06-24 ENCOUNTER — Telehealth: Payer: Self-pay

## 2018-06-24 NOTE — Telephone Encounter (Signed)
She can fly if afebrile for over 24 hours without medications,

## 2018-06-24 NOTE — Telephone Encounter (Signed)
Patient called states that she is just getting over the flu and needs to travel to [place]. She wants to know if you think she is able to travel. I advised her that it was ok as long as she didn't have a fever.

## 2018-06-25 NOTE — Telephone Encounter (Signed)
Called patient LVM in regards to traveling as long as she is afebrile for 24 hours.

## 2018-07-02 ENCOUNTER — Telehealth: Payer: Self-pay | Admitting: Family Medicine

## 2018-07-02 NOTE — Telephone Encounter (Signed)
I left a message asking the patient to call and schedule Medicare AWV-I with Nurse Health Advisor, Kasey.  If the patient calls back, please schedule Medicare Wellness Visit with Nurse Health Advisor. VDM (DD) °

## 2018-07-12 ENCOUNTER — Other Ambulatory Visit: Payer: Self-pay

## 2018-07-12 ENCOUNTER — Telehealth: Payer: Self-pay | Admitting: Family Medicine

## 2018-07-12 NOTE — Telephone Encounter (Signed)
Copied from CRM [id number]. Topic: Quick Communication - See Telephone Encounter >> Jul 12, 2018 12:25 PM Lorrine Kin, NT wrote: CRM for notification. See Telephone encounter for: 07/12/18. Patient calling and states that she received a call from Ledell Peoples with [facility]. States that the pharmacy does not have a metformin prescription for her. Advised that a 90 day supply of metFORMIN (GLUCOPHAGE-XR) 750 MG 24 hr tablet was sent on 06/02/2018. Patient states that the pharmacy has told her that they do not have anything on file for her. Was advised by [facility] that there is Metformin 1000 MG still available for her, but they need approval from Dr Carlynn Purl. Please advise.  Ledell Peoples- [phone] [employer]- [phone]  [id number] [place], [place] - [address]

## 2018-07-12 NOTE — Telephone Encounter (Signed)
Called patient to inform her that a PA was sent in for Metformin 1000 mg and it was denied, so PCP changed [facility] [name]. [facility].

## 2018-07-20 ENCOUNTER — Other Ambulatory Visit: Payer: Self-pay | Admitting: Family Medicine

## 2018-07-20 ENCOUNTER — Ambulatory Visit (INDEPENDENT_AMBULATORY_CARE_PROVIDER_SITE_OTHER): Payer: Medicare Other

## 2018-07-20 VITALS — BP 130/78 | HR 75 | Temp 97.9°F | Resp 16 | Ht 65.0 in | Wt 142.8 lb

## 2018-07-20 DIAGNOSIS — Z0000 Encounter for general adult medical examination without abnormal findings: Secondary | ICD-10-CM | POA: Diagnosis not present

## 2018-07-20 MED ORDER — ACCU-CHEK AVIVA CONNECT W/DEVICE KIT: 1.0000 | PACK | Freq: Every day | 0 refills | Status: DC

## 2018-07-20 NOTE — Progress Notes (Signed)
Subjective:   Rache Klimaszewski is a 73 y.o. female who presents for an Initial Medicare Annual Wellness Visit.  Review of Systems    Cardiac Risk Factors include: advanced age (>71mn, >>22women);diabetes mellitus;dyslipidemia;hypertension;smoking/ tobacco exposure     Objective:    Today's Vitals   07/20/18 1134  BP: 130/78  Pulse: 75  Resp: 16  Temp: 97.9 F (36.6 C)  TempSrc: Oral  SpO2: 99%  Weight: 142 lb 12.8 oz (64.8 kg)  Height: '5\' 5"'$  (1.651 m)   Body mass index is 23.76 kg/m.  Advanced Directives 07/20/2018 02/17/2017 10/14/2016 01/29/2016 09/25/2015 05/08/2015 12/05/2014  Does Patient Have a Medical Advance Directive? No No No No No No No  Would patient like information on creating a medical advance directive? No - Patient declined - - No - patient declined information No - patient declined information - No - patient declined information    Current Medications (verified) Outpatient Encounter Medications as of 07/20/2018  Medication Sig  . aspirin 81 MG tablet Take 81 mg by mouth daily.  .Marland Kitchenatropine 1 % ophthalmic solution Place 1 drop into the right eye 3 (three) times daily.  . blood glucose meter kit and supplies   . brimonidine (ALPHAGAN) 0.2 % ophthalmic solution USE 1 DROP IN BOTH EYE TWICE A DAY  . citalopram (CELEXA) 20 MG tablet Take 1 tablet (20 mg total) by mouth daily.  . clopidogrel (PLAVIX) 75 MG tablet Take 1 tablet (75 mg total) by mouth daily.  . dorzolamide-timolol (COSOPT) 22.3-6.8 MG/ML ophthalmic solution USE 1 DROP IN BOTH EYES 2 TIMES A DAY  . losartan (COZAAR) 25 MG tablet Take 1 tablet (25 mg total) by mouth daily.  . metFORMIN (GLUCOPHAGE-XR) 750 MG 24 hr tablet Take 2 tablets (1,500 mg total) by mouth daily with breakfast.  . Methylcobalamin (B12-ACTIVE) 1 MG CHEW Chew 2 tablets by mouth daily.  . Multiple Vitamin (MULTIVITAMIN) tablet Take 1 tablet by mouth daily.  . prednisoLONE acetate (PRED FORTE) 1 % ophthalmic suspension USE 1 DROP IN RIGHT  EYE 4 TIMES A DAY  . rosuvastatin (CRESTOR) 10 MG tablet Take 1 tablet (10 mg total) by mouth daily.  . timolol (BETIMOL) 0.5 % ophthalmic solution 1 drop.  . Vitamin D, Ergocalciferol, (DRISDOL) 1.25 MG (50000 UT) CAPS capsule TAKE 1 CAPSULE (50,000 UNITS TOTAL) BY MOUTH EVERY 7 (SEVEN) DAYS.   No facility-administered encounter medications on file as of 07/20/2018.     Allergies (verified) Other and Tapentadol hcl   History: Past Medical History:  Diagnosis Date  . Blindness of right eye   . Depression   . Diabetes mellitus without complication ([facility]   . D[place] of [place] (deep venous thrombosis) ([place]   . Glaucoma   . Hyperlipidemia   . Hypertension   . Vascular disease    Past Surgical History:  Procedure Laterality Date  . ABDOMINAL HYSTERECTOMY  1992   Total  . EYE SURGERY    . TOE AMPUTATION Left 2011   All of her toes on the left foot  . VASCULAR SURGERY     Family History  Problem Relation Age of Onset  . Heart disease Father   . Diabetes Mother   . Diabetes Brother   . Kidney disease Brother        Transplant  . Heart disease Brother   . Diabetes Sister   . Diabetes Sister    Social History   Socioeconomic History  . Marital status: Widowed  Spouse name: Not on file  . Number of children: 0  . Years of education: Not on file  . Highest education level: Some college, no degree  Occupational History  . Occupation: Retired  Scientific laboratory technician  . Financial resource strain: Not very hard  . Food insecurity:    Worry: Never true    Inability: Never true  . Transportation needs:    Medical: No    Non-medical: No  Tobacco Use  . Smoking status: Current Every Day Smoker    Packs/day: 0.25    Years: 18.00    Pack years: 4.50    Types: Cigarettes    Start date: 03/16/2000  . Smokeless tobacco: Never Used  Substance and Sexual Activity  . Alcohol use: No    Alcohol/week: 0.0 standard drinks  . Drug use: No  . Sexual activity: Not Currently  Lifestyle  .  Physical activity:    Days per week: 0 days    Minutes per session: 0 min  . Stress: Not at all  Relationships  . Social connections:    Talks on phone: Once a week    Gets together: Twice a week    Attends religious service: More than 4 times per year    Active member of club or organization: No    Attends meetings of clubs or organizations: Never    Relationship status: Widowed  Other Topics Concern  . Not on file  Social History Narrative  . Not on file    Tobacco Counseling Ready to quit: Not Answered Counseling given: Not Answered   Clinical Intake:  Pre-visit preparation completed: Yes  Pain : No/denies pain     BMI - recorded: 23.76 Nutritional Status: BMI of 19-24  Normal Nutritional Risks: Nausea/ vomitting/ diarrhea(recent increase in metformin) Diabetes: Yes CBG done?: No Did pt. bring in CBG monitor from home?: No   Nutrition Risk Assessment:  Has the patient had any N/V/D within the last 2 months?  Yes  Does the patient have any non-healing wounds?  No  Has the patient had any unintentional weight loss or weight gain?  No   Diabetes:  Is the patient diabetic?  Yes  If diabetic, was a CBG obtained today?  No  Did the patient bring in their glucometer from home?  No  How often do you monitor your CBG's? Pt states she doesn't really check her blood sugar. .   Financial Strains and Diabetes Management:  Are you having any financial strains with the device, your supplies or your medication? No .  Does the patient want to be seen by Chronic Care Management for management of their diabetes?  No  Would the patient like to be referred to a Nutritionist or for Diabetic Management?  No   Diabetic Exams:  Diabetic Eye Exam: Completed 08/11/17. Negative retinopathy.  Diabetic Foot Exam: Completed 03/16/18.    How often do you need to have someone help you when you read instructions, pamphlets, or other written materials from your doctor or pharmacy?: 1 -  Never What is the last grade level you completed in school?: some college  Interpreter Needed?: No  Information entered by :: Clemetine Marker LPN   Activities of Daily Living In your present state of health, do you have any difficulty performing the following activities: 07/20/2018 03/16/2018  Hearing? N N  Comment declines hearing aids -  Vision? Y Y  Comment - Blind in right eye  Difficulty concentrating or making decisions? N N  Walking or climbing stairs? N Y  Dressing or bathing? N N  Doing errands, shopping? N N  Preparing Food and eating ? N -  Using the Toilet? N -  In the past six months, have you accidently leaked urine? Y -  Do you have problems with loss of bowel control? N -  Managing your Medications? N -  Managing your Finances? N -  Housekeeping or managing your Housekeeping? N -  Some recent data might be hidden     Immunizations and Health Maintenance Immunization History  Administered Date(s) Administered  . Influenza Split 04/27/2012  . Influenza, High Dose Seasonal PF 05/08/2015, 02/17/2017, 03/16/2018  . Influenza-Unspecified 02/23/2013, 02/23/2014  . Pneumococcal Conjugate-13 05/08/2015  . Pneumococcal Polysaccharide-23 03/08/2014   There are no preventive care reminders to display for this patient.  Patient Care Team: Steele Sizer, MD as PCP - General (Family Medicine) Steele Sizer, MD as Attending Physician (Family Medicine) Christene Lye, MD (General Surgery)  Indicate any recent Medical Services you may have received from other than [facility] providers in the past year (date may be approximate).     Assessment:   This is a routine wellness examination for Gennie.  Hearing/Vision screen Hearing Screening Comments: Pt has no difficulty hearing  Vision Screening Comments: Pt is blind in right eye. Goes [facility]  Dietary issues and exercise activities discussed: Current Exercise Habits: The patient does not participate  in regular exercise at present, Exercise limited by: orthopedic condition(s)  Goals    . Patient Stated     Patient states she would like to have increased energy and more social outings.        Depression Screen PHQ 2/9 Scores 07/20/2018 03/16/2018 07/09/2017 02/17/2017 09/25/2015 05/08/2015 12/05/2014  PHQ - 2 Score '2 1 1 1 '$ 0 0 0  PHQ- 9 Score '5 3 2 '$ - - - -    Fall Risk Fall Risk  07/20/2018 03/16/2018 07/09/2017 02/17/2017 09/25/2015  Falls in the past year? 1 Yes No No No  Number falls in past yr: 1 2 or more - - -  Comment fell while dreaming - - - -  Injury with Fall? 1 Yes - - -  Comment - right leg- hit night stand after falling out of bed - - -  Follow up Falls prevention discussed - - - -    FALL RISK PREVENTION PERTAINING TO THE HOME:  Any stairs in or around the home? Yes  If so, do they handrails? Yes   Home free of loose throw rugs in walkways, pet beds, electrical cords, etc? Yes  Adequate lighting in your home to reduce risk of falls? Yes   ASSISTIVE DEVICES UTILIZED TO PREVENT FALLS:  Life alert? No  Use of a cane, walker or w/c? No  Grab bars in the bathroom? Yes  Shower chair or bench in shower? Yes  Elevated toilet seat or a handicapped toilet? Yes   DME ORDERS:  DME order needed?  No   TIMED UP AND GO:  Was the test performed? Yes .  Length of time to ambulate 10 feet: 6 sec.   GAIT:  Appearance of gait: Gait stead-fast and without the use of an assistive device.   Education: Fall risk prevention has been discussed.  Intervention(s) required? No    Cognitive Function:        Screening Tests Health Maintenance  Topic Date Due  . COLONOSCOPY  05/19/2019 (Originally 09/22/1995)  . OPHTHALMOLOGY EXAM  08/12/2018  . HEMOGLOBIN A1C  09/15/2018  . FOOT EXAM  03/17/2019  . TETANUS/TDAP  12/05/2019  . MAMMOGRAM  03/25/2020  . INFLUENZA VACCINE  Completed  . DEXA SCAN  Completed  . Hepatitis C Screening  Completed  . PNA vac Low Risk Adult   Completed    Qualifies for Shingles Vaccine? Yes  Zostavax completed per patient, unknown date. Due for Shingrix. Education has been provided regarding the importance of this vaccine. Pt has been advised to call insurance company to determine out of pocket expense. Advised may also receive vaccine at local pharmacy or Health Dept. Verbalized acceptance and understanding.  Tdap: Although this vaccine is not a covered service during a Wellness Exam, does the patient still wish to receive this vaccine today?  No .  Education has been provided regarding the importance of this vaccine. Advised may receive this vaccine at local pharmacy or Health Dept. Aware to provide a copy of the vaccination record if obtained from local pharmacy or Health Dept. Verbalized acceptance and understanding.  Flu Vaccine: Up to date  Pneumococcal Vaccine: Up to date   Cancer Screenings:  Colorectal Screening: Declines colonoscopy. States she will consider Cologuard but not today.   Mammogram: Completed 03/25/18. Repeat every year.  Bone Density: Completed 03/10/2007. Results reflect NORMAL. Repeat every 2 years. Ordered today. Pt provided with contact information and advised to call to schedule appt.   Lung Cancer Screening: (Low Dose CT Chest recommended if Age 70-80 years, 30 pack-year currently smoking OR have quit w/in 15years.) does not qualify.   Additional Screening:  Hepatitis C Screening: does qualify; Completed 04/27/12  Vision Screening: Recommended annual ophthalmology exams for early detection of glaucoma and other disorders of the eye. Is the patient up to date with their annual eye exam?  Yes  Who is the provider or what is the name of the office in which the pt attends annual eye exams? [place] Screening: Recommended annual dental exams for proper oral hygiene  Community Resource Referral:  CRR required this visit?  No      Plan:    I have personally reviewed and  addressed the Medicare Annual Wellness questionnaire and have noted the following in the patient's chart:  A. Medical and social history B. Use of alcohol, tobacco or illicit drugs  C. Current medications and supplements D. Functional ability and status E.  Nutritional status F.  Physical activity G. Advance directives H. List of other physicians I.  Hospitalizations, surgeries, and ER visits in previous 12 months J.  [place] such as hearing and vision if needed, cognitive and depression L. Referrals and appointments   In addition, I have reviewed and discussed with patient certain preventive protocols, quality metrics, and best practice recommendations. A written personalized care plan for preventive services as well as general preventive health recommendations were provided to patient.   Signed,  Clemetine Marker, LPN Nurse Health Advisor   Nurse Notes: Pt is concerned about the change in her dosage of metformin due to insurance. She c/o upset stomach and feeling weak since starting it 4 days ago. She contacted her insurance company who told her a prior authorization could be done pt wants to go back to metformin ER '1000mg'$  if available and requested [place] to look into this again. She also requests new prescription for meter, test strips and lancets to start checking her blood sugar even though she stated "In my opinion I don't really have a  problem with my diabetes"

## 2018-07-20 NOTE — Patient Instructions (Signed)
Lisa [name] , Thank you for taking time to come for your Medicare Wellness Visit. I appreciate your ongoing commitment to your health goals. Please review the following plan we discussed and let me know if I can assist you in the future.   Screening recommendations/referrals: Mammogram: done 03/25/18  Bone Density: done 03/10/2007. Please call [phone] to schedule your bone density exam Recommended yearly ophthalmology/optometry visit for glaucoma screening and checkup Recommended yearly dental visit for hygiene and checkup  Vaccinations: Influenza vaccine: done 03/16/18 Pneumococcal vaccine: done 05/08/15 Tdap vaccine: due - please contact us if you get a cut or scrape Shingles vaccine: Shingrix discussed. Please contact your pharmacy for coverage information.   Advanced directives: Advance directive discussed with you today. Even though you declined this today please call our office should you change your mind and we can give you the proper paperwork for you to fill out.  Conditions/risks identified: Recommend increasing physical activity.   Next appointment: Please follow up in one year for your Medicare Annual Wellness visit.     Preventive Care 73 Years and Older, Female Preventive care refers to lifestyle choices and visits with your health care provider that can promote health and wellness. What does preventive care include?  A yearly physical exam. This is also called an annual well check.  Dental exams once or twice a year.  Routine eye exams. Ask your health care provider how often you should have your eyes checked.  Personal lifestyle choices, including:  Daily care of your teeth and gums.  Regular physical activity.  Eating a healthy diet.  Avoiding tobacco and drug use.  Limiting alcohol use.  Practicing safe sex.  Taking low-dose aspirin every day.  Taking vitamin and mineral supplements as recommended by your health care provider. What happens  during an annual well check? The services and screenings done by your health care provider during your annual well check will depend on your age, overall health, lifestyle risk factors, and family history of disease. Counseling  Your health care provider may ask you questions about your:  Alcohol use.  Tobacco use.  Drug use.  Emotional well-being.  Home and relationship well-being.  Sexual activity.  Eating habits.  History of falls.  Memory and ability to understand (cognition).  Work and work Astronomer.  Reproductive health. Screening  You may have the following tests or measurements:  Height, weight, and BMI.  Blood pressure.  Lipid and cholesterol levels. These may be checked every 5 years, or more frequently if you are over 24 years old.  Skin check.  Lung cancer screening. You may have this screening every year starting at age 43 if you have a 30-pack-year history of smoking and currently smoke or have quit within the past 15 years.  Fecal occult blood test (FOBT) of the stool. You may have this test every year starting at age 78.  Flexible sigmoidoscopy or colonoscopy. You may have a sigmoidoscopy every 5 years or a colonoscopy every 10 years starting at age 53.  Hepatitis C blood test.  Hepatitis B blood test.  Sexually transmitted disease (STD) testing.  Diabetes screening. This is done by checking your blood sugar (glucose) after you have not eaten for a while (fasting). You may have this done every 1-3 years.  Bone density scan. This is done to screen for osteoporosis. You may have this done starting at age 78.  Mammogram. This may be done every 1-2 years. Talk to your health care provider about how often  you should have regular mammograms. Talk with your health care provider about your test results, treatment options, and if necessary, the need for more tests. Vaccines  Your health care provider may recommend certain vaccines, such  as:  Influenza vaccine. This is recommended every year.  Tetanus, diphtheria, and acellular pertussis (Tdap, Td) vaccine. You may need a Td booster every 10 years.  Zoster vaccine. You may need this after age 62.  Pneumococcal 13-valent conjugate (PCV13) vaccine. One dose is recommended after age 4.  Pneumococcal polysaccharide (PPSV23) vaccine. One dose is recommended after age 79. Talk to your health care provider about which screenings and vaccines you need and how often you need them. This information is not intended to replace advice given to you by your health care provider. Make sure you discuss any questions you have with your health care provider. Document Released: 06/08/2015 Document Revised: 01/30/2016 Document Reviewed: 03/13/2015 Elsevier Interactive Patient Education  2017 [place] Prevention in the Home Falls can cause injuries. They can happen to people of all ages. There are many things you can do to make your home safe and to help prevent falls. What can I do on the outside of my home?  Regularly fix the edges of walkways and driveways and fix any cracks.  Remove anything that might make you trip as you walk through a door, such as a raised step or threshold.  Trim any bushes or trees on the path to your home.  Use bright outdoor lighting.  Clear any walking paths of anything that might make someone trip, such as rocks or tools.  Regularly check to see if handrails are loose or broken. Make sure that both sides of any steps have handrails.  Any raised decks and porches should have guardrails on the edges.  Have any leaves, snow, or ice cleared regularly.  Use sand or salt on walking paths during winter.  Clean up any spills in your garage right away. This includes oil or grease spills. What can I do in the bathroom?  Use night lights.  Install grab bars by the toilet and in the tub and shower. Do not use towel bars as grab bars.  Use  non-skid mats or decals in the tub or shower.  If you need to sit down in the shower, use a plastic, non-slip stool.  Keep the floor dry. Clean up any water that spills on the floor as soon as it happens.  Remove soap buildup in the tub or shower regularly.  Attach bath mats securely with double-sided non-slip rug tape.  Do not have throw rugs and other things on the floor that can make you trip. What can I do in the bedroom?  Use night lights.  Make sure that you have a light by your bed that is easy to reach.  Do not use any sheets or blankets that are too big for your bed. They should not hang down onto the floor.  Have a firm chair that has side arms. You can use this for support while you get dressed.  Do not have throw rugs and other things on the floor that can make you trip. What can I do in the kitchen?  Clean up any spills right away.  Avoid walking on wet floors.  Keep items that you use a lot in easy-to-reach places.  If you need to reach something above you, use a strong step stool that has a grab bar.  Keep electrical cords  out of the way.  Do not use floor polish or wax that makes floors slippery. If you must use wax, use non-skid floor wax.  Do not have throw rugs and other things on the floor that can make you trip. What can I do with my stairs?  Do not leave any items on the stairs.  Make sure that there are handrails on both sides of the stairs and use them. Fix handrails that are broken or loose. Make sure that handrails are as long as the stairways.  Check any carpeting to make sure that it is firmly attached to the stairs. Fix any carpet that is loose or worn.  Avoid having throw rugs at the top or bottom of the stairs. If you do have throw rugs, attach them to the floor with carpet tape.  Make sure that you have a light switch at the top of the stairs and the bottom of the stairs. If you do not have them, ask someone to add them for you. What  else can I do to help prevent falls?  Wear shoes that:  Do not have high heels.  Have rubber bottoms.  Are comfortable and fit you well.  Are closed at the toe. Do not wear sandals.  If you use a stepladder:  Make sure that it is fully opened. Do not climb a closed stepladder.  Make sure that both sides of the stepladder are locked into place.  Ask someone to hold it for you, if possible.  Clearly mark and make sure that you can see:  Any grab bars or handrails.  First and last steps.  Where the edge of each step is.  Use tools that help you move around (mobility aids) if they are needed. These include:  Canes.  Walkers.  Scooters.  Crutches.  Turn on the lights when you go into a dark area. Replace any light bulbs as soon as they burn out.  Set up your furniture so you have a clear path. Avoid moving your furniture around.  If any of your floors are uneven, fix them.  If there are any pets around you, be aware of where they are.  Review your medicines with your doctor. Some medicines can make you feel dizzy. This can increase your chance of falling. Ask your doctor what other things that you can do to help prevent falls. This information is not intended to replace advice given to you by your health care provider. Make sure you discuss any questions you have with your health care provider. Document Released: 03/08/2009 Document Revised: 10/18/2015 Document Reviewed: 06/16/2014 Elsevier Interactive Patient Education  2017 [employer].

## 2018-08-23 ENCOUNTER — Other Ambulatory Visit: Payer: Self-pay | Admitting: Family Medicine

## 2018-08-23 MED ORDER — METFORMIN HCL ER 500 MG PO TB24: 1500.0000 mg | ORAL_TABLET | Freq: Every day | ORAL | 0 refills | Status: DC

## 2018-09-15 ENCOUNTER — Ambulatory Visit: Payer: Medicare Other | Admitting: Family Medicine

## 2018-09-25 ENCOUNTER — Other Ambulatory Visit: Payer: Self-pay | Admitting: Family Medicine

## 2018-09-27 NOTE — Telephone Encounter (Signed)
Request for diabetes medication. [facility]  Last office visit pertaining to diabetes: 03/16/2018   Lab Results  Component Value Date   HGBA1C 7.5 (H) 03/16/2018     Follow up 10/15/2018

## 2018-10-08 ENCOUNTER — Other Ambulatory Visit: Payer: Self-pay | Admitting: Family Medicine

## 2018-10-08 DIAGNOSIS — E785 Hyperlipidemia, unspecified: Secondary | ICD-10-CM

## 2018-10-08 NOTE — Telephone Encounter (Signed)
Refill Request for Cholesterol medication. Crestor 10 mg   Lab Results  Component Value Date   CHOL 133 03/16/2018   HDL 59 03/16/2018   LDLCALC 56 03/16/2018   TRIG 92 03/16/2018   CHOLHDL 2.3 03/16/2018    Follow up visit:  10/15/2018

## 2018-10-11 NOTE — Telephone Encounter (Signed)
Pt is scheduled °

## 2018-10-15 ENCOUNTER — Encounter: Payer: Self-pay | Admitting: Family Medicine

## 2018-10-15 ENCOUNTER — Ambulatory Visit (INDEPENDENT_AMBULATORY_CARE_PROVIDER_SITE_OTHER): Payer: Medicare Other | Admitting: Family Medicine

## 2018-10-15 ENCOUNTER — Other Ambulatory Visit: Payer: Self-pay

## 2018-10-15 VITALS — BP 176/94 | HR 68 | Temp 98.2°F | Resp 16 | Ht 65.0 in | Wt 144.9 lb

## 2018-10-15 DIAGNOSIS — F330 Major depressive disorder, recurrent, mild: Secondary | ICD-10-CM

## 2018-10-15 DIAGNOSIS — E785 Hyperlipidemia, unspecified: Secondary | ICD-10-CM | POA: Diagnosis not present

## 2018-10-15 DIAGNOSIS — E559 Vitamin D deficiency, unspecified: Secondary | ICD-10-CM | POA: Diagnosis not present

## 2018-10-15 DIAGNOSIS — I739 Peripheral vascular disease, unspecified: Secondary | ICD-10-CM

## 2018-10-15 DIAGNOSIS — I10 Essential (primary) hypertension: Secondary | ICD-10-CM

## 2018-10-15 DIAGNOSIS — G547 Phantom limb syndrome without pain: Secondary | ICD-10-CM | POA: Diagnosis not present

## 2018-10-15 DIAGNOSIS — E118 Type 2 diabetes mellitus with unspecified complications: Secondary | ICD-10-CM

## 2018-10-15 LAB — POCT GLYCOSYLATED HEMOGLOBIN (HGB A1C): HbA1c, POC (controlled diabetic range): 7.6 % — AB (ref 0.0–7.0)

## 2018-10-15 MED ORDER — VITAMIN D (ERGOCALCIFEROL) 1.25 MG (50000 UNIT) PO CAPS: 50000.0000 [IU] | ORAL_CAPSULE | ORAL | 1 refills | Status: DC

## 2018-10-15 MED ORDER — CITALOPRAM HYDROBROMIDE 20 MG PO TABS: 20.0000 mg | ORAL_TABLET | Freq: Every day | ORAL | 1 refills | Status: DC

## 2018-10-15 MED ORDER — CLOPIDOGREL BISULFATE 75 MG PO TABS: 75.0000 mg | ORAL_TABLET | Freq: Every day | ORAL | 1 refills | Status: DC

## 2018-10-15 MED ORDER — LOSARTAN POTASSIUM 25 MG PO TABS: 25.0000 mg | ORAL_TABLET | Freq: Every day | ORAL | 0 refills | Status: DC

## 2018-10-15 MED ORDER — METFORMIN HCL ER 500 MG PO TB24: 1000.0000 mg | ORAL_TABLET | Freq: Every day | ORAL | 0 refills | Status: DC

## 2018-10-15 MED ORDER — ROSUVASTATIN CALCIUM 10 MG PO TABS: 10.0000 mg | ORAL_TABLET | Freq: Every day | ORAL | 1 refills | Status: DC

## 2018-10-15 NOTE — Progress Notes (Signed)
Name: Lisa [name]   MRN: [id number]    DOB: 10/19/1945   Date:10/15/2018       Progress Note  Subjective  Chief Complaint  Chief Complaint  Patient presents with  . Medication Refill  . Depression  . Diabetes    checks occcasionally BS average-130  . Hypertension    Has been out of her BP medication for the past 3 days-she ran out and has felt off   . Dyslipidemia    HPI  DMII: hgbA1C was  7.7%, 7.6%and again at 7.6%, 7.1% and today it was 76% she is off Glipizidedaily, she is taking only taking 1000 mg daily , did not go up to 3 daily.No recent episodes of hypoglycemia.Taking ARB for kidney protection.She sees vascular surgeon and is on Plavix. She does not want to take medication for phanton syndrome.She denies polyphagia but states she likes to drink water and has urinary frequency,also has to take depends at night, she is seeing Urologist.  Urinary incontinence: going on for over one year but getting worse, states now has to wear depends at night, last visit urine culture was negative. Advised her to contact [facility] again . She cannot afford PT or estrogen therapy   HTN: taking medications, denies side effects. No chest pain or palpitation . BPwas well controlled during her last visit in Feb, but very high today, discussed taking two of losartan but she wants to hold off , discussed bp checks at home.   Major Depression in mild:  she Is doing well on Citalopram, she states she is doing well at this time. No crying spells. No suicidal thoughts or ideation. phq is stable  Vitamin B12 and Vitamin D deficiency: she is back taking supplementations, also takes B12 supplementation , reviewed last labs   Peripheral neuropathy and phanton pain: she states pain is continuous, pain is constant, right now it is a 3-4/10 She does not want to take any prescription medication at this time she will continue to take prn Tylenol  PVD: sheseesDr. Lucky Cowboy, she is on Plavix, s/p  amputation. She no longertakesUltram but sometimes the pain can be intense on her left foot. Compliant with plavix and crestor   Dyslipidemia: taking Crestor and denies myalgia. No chest pain.Reviewed last labs   Blind right eye: sees ophthalmologist ,on 6 months follow up, she has cataracts now but not having surgery    Patient Active Problem List   Diagnosis Date Noted  . Uncontrolled type 2 diabetes with eye complications ([place]) 25/49/8264  . DM (diabetes mellitus), type 2 with peripheral vascular complications ([place]) 15/83/0940  . Amputated toe ([place]) 12/02/2014  . Benign essential HTN 12/02/2014  . Depression, major, recurrent, mild ([place]) 12/02/2014  . Dyslipidemia 12/02/2014  . Vitamin B12 deficiency 12/02/2014  . Peripheral neuropathic pain 12/02/2014  . Peripheral artery disease ([place]) 12/02/2014  . Phantom limb ([place]) 12/02/2014  . Diabetes mellitus with nephropathy ([place]) 12/02/2014  . Detached retina 12/02/2014  . Type 2 diabetes mellitus with peripheral neuropathy ([place]) 12/02/2014  . Tobacco use 12/02/2014  . Urge incontinence 12/02/2014  . 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  . Vitamin D deficiency 12/02/2014    Past Surgical History:  Procedure Laterality Date  . ABDOMINAL HYSTERECTOMY  1992   Total  . EYE SURGERY    . TOE AMPUTATION Left 2011   All of her toes on the left foot  . VASCULAR SURGERY      Family History  Problem Relation Age of Onset  . Heart  disease Father   . Diabetes Mother   . Diabetes Brother   . Kidney disease Brother        Transplant  . Heart disease Brother   . Diabetes Sister   . Diabetes Sister     Social History   Socioeconomic History  . Marital status: Widowed    Spouse name: Not on file  . Number of children: 0  . Years of education: Not on file  . Highest education level: Some college, no degree  Occupational History  . Occupation: Retired  Scientific laboratory technician  . Financial resource strain: Not very hard  . Food insecurity:     Worry: Never true    Inability: Never true  . Transportation needs:    Medical: No    Non-medical: No  Tobacco Use  . Smoking status: Current Every Day Smoker    Packs/day: 0.25    Years: 18.00    Pack years: 4.50    Types: Cigarettes    Start date: 03/16/2000  . Smokeless tobacco: Never Used  Substance and Sexual Activity  . Alcohol use: No    Alcohol/week: 0.0 standard drinks  . Drug use: No  . Sexual activity: Not Currently  Lifestyle  . Physical activity:    Days per week: 0 days    Minutes per session: 0 min  . Stress: Not at all  Relationships  . Social connections:    Talks on phone: Once a week    Gets together: Twice a week    Attends religious service: More than 4 times per year    Active member of club or organization: No    Attends meetings of clubs or organizations: Never    Relationship status: Widowed  . Intimate partner violence:    Fear of current or ex partner: No    Emotionally abused: No    Physically abused: No    Forced sexual activity: No  Other Topics Concern  . Not on file  Social History Narrative  . Not on file     Current Outpatient Medications:  .  aspirin 81 MG tablet, Take 81 mg by mouth daily., Disp: , Rfl:  .  atropine 1 % ophthalmic solution, Place 1 drop into the right eye 3 (three) times daily., Disp: , Rfl:  .  Blood Glucose Monitoring Suppl (ACCU-CHEK AVIVA CONNECT) w/Device KIT, 1 each by Does not apply route daily. And supplies E11.9 check fsbs bid, Disp: 1 kit, Rfl: 0 .  brimonidine (ALPHAGAN) 0.2 % ophthalmic solution, USE 1 DROP IN BOTH EYE TWICE A DAY, Disp: , Rfl: 3 .  citalopram (CELEXA) 20 MG tablet, Take 1 tablet (20 mg total) by mouth daily., Disp: 90 tablet, Rfl: 1 .  clopidogrel (PLAVIX) 75 MG tablet, Take 1 tablet (75 mg total) by mouth daily., Disp: 90 tablet, Rfl: 1 .  dorzolamide-timolol (COSOPT) 22.3-6.8 MG/ML ophthalmic solution, USE 1 DROP IN BOTH EYES 2 TIMES A DAY, Disp: , Rfl: 4 .  losartan (COZAAR) 25 MG  tablet, Take 1-2 tablets (25-50 mg total) by mouth daily., Disp: 100 tablet, Rfl: 0 .  metFORMIN (GLUCOPHAGE-XR) 500 MG 24 hr tablet, Take 2 tablets (1,000 mg total) by mouth daily with breakfast. 1 in the morning and 2 in the evenings, Disp: 180 tablet, Rfl: 0 .  Methylcobalamin (B12-ACTIVE) 1 MG CHEW, Chew 2 tablets by mouth daily., Disp: , Rfl:  .  Multiple Vitamin (MULTIVITAMIN) tablet, Take 1 tablet by mouth daily., Disp: , Rfl:  .  prednisoLONE acetate (PRED FORTE) 1 % ophthalmic suspension, USE 1 DROP IN RIGHT EYE 4 TIMES A DAY, Disp: , Rfl: 3 .  rosuvastatin (CRESTOR) 10 MG tablet, Take 1 tablet (10 mg total) by mouth daily., Disp: 90 tablet, Rfl: 1 .  timolol (BETIMOL) 0.5 % ophthalmic solution, 1 drop., Disp: , Rfl:  .  Vitamin D, Ergocalciferol, (DRISDOL) 1.25 MG (50000 UT) CAPS capsule, Take 1 capsule (50,000 Units total) by mouth every 7 (seven) days., Disp: 8 capsule, Rfl: 1 .  blood glucose meter kit and supplies, , Disp: , Rfl:   Allergies  Allergen Reactions  . Other Itching    Nucinta  . Tapentadol Hcl     I personally reviewed active problem list, medication list, allergies, family history, social history with the patient/caregiver today.   ROS  Constitutional: Negative for fever or weight change.  Respiratory: Negative for cough and shortness of breath.   Cardiovascular: Negative for chest pain or palpitations.  Gastrointestinal: Negative for abdominal pain, no bowel changes.  Musculoskeletal: Negative for gait problem or joint swelling.  Skin: Negative for rash.  Neurological: Negative for dizziness or headache.  No other specific complaints in a complete review of systems (except as listed in HPI above).  Objective  Vitals:   10/15/18 1403 10/15/18 1436 10/15/18 1458  BP: (!) 182/92 (!) 180/100 (!) 176/94  Pulse: 68    Resp: 16    Temp: 98.2 F (36.8 C)    TempSrc: Oral    SpO2: 98%    Weight: 144 lb 14.4 oz (65.7 kg)    Height: 5' 5" (1.651 m)       Body mass index is 24.11 kg/m.  Physical Exam  Constitutional: Patient appears well-developed and well-nourished. Obese No distress.  HEENT: head atraumatic, normocephalic, pupils equal and reactive to light,  neck supple Cardiovascular: Normal rate, regular rhythm and normal heart sounds.  No murmur heard. No BLE edema. Pulmonary/Chest: Effort normal and breath sounds normal. No respiratory distress. Abdominal: Soft.  There is no tenderness. Psychiatric: Patient has a normal mood and affect. behavior is normal. Judgment and thought content normal.   Recent Results (from the past 2160 hour(s))  POCT HgB A1C     Status: Abnormal   Collection Time: 10/15/18  2:09 PM  Result Value Ref Range   Hemoglobin A1C     HbA1c POC (<> result, manual entry)     HbA1c, POC (prediabetic range)     HbA1c, POC (controlled diabetic range) 7.6 (A) 0.0 - 7.0 %     PHQ2/9: Depression screen [facility] 2/9 10/15/2018 07/20/2018 03/16/2018 07/09/2017 02/17/2017  Decreased Interest _0 0  Down, Depressed, Hopeless 1 1 0 0 1  PHQ - 2 Score _1 Altered sleeping 0 0 0 0 -  Tired, decreased energy _2 -  Change in appetite 0 0 1 0 -  Feeling bad or failure about yourself  0 0 0 0 -  Trouble concentrating 0 0 0 0 -  Moving slowly or fidgety/restless 0 0 0 0 -  Suicidal thoughts 0 0 0 - -  PHQ-9 Score _3 -  Difficult doing work/chores Not difficult at all Not difficult at all Not difficult at all Not difficult at all -    phq 9 is positive   Fall Risk: Fall Risk  10/15/2018 07/20/2018 03/16/2018 07/09/2017 02/17/2017  Falls in the past year? 1 1 Yes No No  Number falls in past yr: _0 or more - -  Comment [place] out of bed twice in the same night fell while dreaming - - -  Injury with Fall? 1 1 Yes - -  Comment - - right leg- hit night stand after falling out of bed - -  Follow up - Falls prevention discussed - - -    Functional Status Survey: Is the patient deaf or have difficulty  hearing?: No Does the patient have difficulty seeing, even when wearing glasses/contacts?: Yes Does the patient have difficulty concentrating, remembering, or making decisions?: No Does the patient have difficulty walking or climbing stairs?: No Does the patient have difficulty dressing or bathing?: No Does the patient have difficulty doing errands alone such as visiting a doctor's office or shopping?: No    Assessment & Plan  1. Diabetes mellitus type 2 with complications (HCC)  - POCT HgB A1C - metFORMIN (GLUCOPHAGE-XR) 500 MG 24 hr tablet; Take 2 tablets (1,000 mg total) by mouth daily with breakfast. 1 in the morning and 2 in the evenings  Dispense: 180 tablet; Refill: 0  2. Depression, major, recurrent, mild (HCC)  - citalopram (CELEXA) 20 MG tablet; Take 1 tablet (20 mg total) by mouth daily.  Dispense: 90 tablet; Refill: 1  3. Peripheral artery disease (HCC)  - clopidogrel (PLAVIX) 75 MG tablet; Take 1 tablet (75 mg total) by mouth daily.  Dispense: 90 tablet; Refill: 1  4. Dyslipidemia  - rosuvastatin (CRESTOR) 10 MG tablet; Take 1 tablet (10 mg total) by mouth daily.  Dispense: 90 tablet; Refill: 1  5. Vitamin D deficiency  - Vitamin D, Ergocalciferol, (DRISDOL) 1.25 MG (50000 UT) CAPS capsule; Take 1 capsule (50,000 Units total) by mouth every 7 (seven) days.  Dispense: 8 capsule; Refill: 1  6. Benign essential HTN  - losartan (COZAAR) 25 MG tablet; Take 1-2 tablets (25-50 mg total) by mouth daily.  Dispense: 100 tablet; Refill: 0   7. Phantom limb ([place])

## 2018-10-28 DIAGNOSIS — E119 Type 2 diabetes mellitus without complications: Secondary | ICD-10-CM | POA: Diagnosis not present

## 2018-10-28 LAB — HM DIABETES EYE EXAM

## 2018-12-15 ENCOUNTER — Telehealth: Payer: Self-pay | Admitting: Family Medicine

## 2018-12-15 DIAGNOSIS — M542 Cervicalgia: Secondary | ICD-10-CM | POA: Diagnosis not present

## 2018-12-15 DIAGNOSIS — I6523 Occlusion and stenosis of bilateral carotid arteries: Secondary | ICD-10-CM | POA: Diagnosis not present

## 2018-12-15 DIAGNOSIS — M50322 Other cervical disc degeneration at C5-C6 level: Secondary | ICD-10-CM | POA: Diagnosis not present

## 2018-12-15 DIAGNOSIS — M545 Low back pain: Secondary | ICD-10-CM | POA: Diagnosis not present

## 2018-12-15 NOTE — Telephone Encounter (Signed)
Lisa [name] sis in law, calling back to follow up on an appt request with Dr Ancil Boozer.  Pt is in a lot of pain, and states they may have to end up going to UC if she cannot be seen. Would like call back from asst please

## 2018-12-15 NOTE — Telephone Encounter (Signed)
Pt called wanting to schedule an appt. Pt is having pain on lower back, leg, shoulder blade and neck area, ongoing for 5 days. Pt was advised to go to UC since we have no openings for several weeks. Pts stated she would rather see Dr Ancil Boozer in person. I told the pt I would send this message and call her back if we get a cancellation. Pt would like a call back as well from clinical staff.

## 2018-12-16 NOTE — Telephone Encounter (Signed)
Pt saw someone on yesterday. Still in pain

## 2019-01-28 DIAGNOSIS — H401121 Primary open-angle glaucoma, left eye, mild stage: Secondary | ICD-10-CM | POA: Diagnosis not present

## 2019-02-01 ENCOUNTER — Telehealth: Payer: Self-pay | Admitting: Family Medicine

## 2019-02-01 NOTE — Telephone Encounter (Signed)
Pt called and stated that [facility] sent a letter recalling metFORMIN (GLUCOPHAGE-XR) 500 MG 24 hr tablet [[id number]] and would like something else called in. Please advise

## 2019-02-01 NOTE — Telephone Encounter (Signed)
Called patient in regards to recall on Metformin. I suggested that she call her pharmacy and request a different manufacturer and a replacement on her medication. She was upset that no one contacted her sooner about the recall.

## 2019-02-03 DIAGNOSIS — H401113 Primary open-angle glaucoma, right eye, severe stage: Secondary | ICD-10-CM | POA: Diagnosis not present

## 2019-02-15 ENCOUNTER — Encounter: Payer: Self-pay | Admitting: Family Medicine

## 2019-02-15 ENCOUNTER — Other Ambulatory Visit: Payer: Self-pay

## 2019-02-15 ENCOUNTER — Ambulatory Visit (INDEPENDENT_AMBULATORY_CARE_PROVIDER_SITE_OTHER): Payer: Medicare Other | Admitting: Family Medicine

## 2019-02-15 VITALS — BP 156/98 | HR 80 | Temp 97.3°F | Resp 16 | Ht 65.0 in | Wt 146.2 lb

## 2019-02-15 DIAGNOSIS — E785 Hyperlipidemia, unspecified: Secondary | ICD-10-CM

## 2019-02-15 DIAGNOSIS — S98132A Complete traumatic amputation of one left lesser toe, initial encounter: Secondary | ICD-10-CM | POA: Diagnosis not present

## 2019-02-15 DIAGNOSIS — F330 Major depressive disorder, recurrent, mild: Secondary | ICD-10-CM

## 2019-02-15 DIAGNOSIS — Z9119 Patient's noncompliance with other medical treatment and regimen: Secondary | ICD-10-CM | POA: Diagnosis not present

## 2019-02-15 DIAGNOSIS — G547 Phantom limb syndrome without pain: Secondary | ICD-10-CM | POA: Diagnosis not present

## 2019-02-15 DIAGNOSIS — E118 Type 2 diabetes mellitus with unspecified complications: Secondary | ICD-10-CM | POA: Diagnosis not present

## 2019-02-15 DIAGNOSIS — E1142 Type 2 diabetes mellitus with diabetic polyneuropathy: Secondary | ICD-10-CM | POA: Diagnosis not present

## 2019-02-15 DIAGNOSIS — I739 Peripheral vascular disease, unspecified: Secondary | ICD-10-CM

## 2019-02-15 DIAGNOSIS — I10 Essential (primary) hypertension: Secondary | ICD-10-CM

## 2019-02-15 DIAGNOSIS — E559 Vitamin D deficiency, unspecified: Secondary | ICD-10-CM | POA: Diagnosis not present

## 2019-02-15 DIAGNOSIS — E538 Deficiency of other specified B group vitamins: Secondary | ICD-10-CM | POA: Diagnosis not present

## 2019-02-15 DIAGNOSIS — E1121 Type 2 diabetes mellitus with diabetic nephropathy: Secondary | ICD-10-CM | POA: Diagnosis not present

## 2019-02-15 DIAGNOSIS — Z91199 Patient's noncompliance with other medical treatment and regimen due to unspecified reason: Secondary | ICD-10-CM

## 2019-02-15 DIAGNOSIS — Z23 Encounter for immunization: Secondary | ICD-10-CM

## 2019-02-15 MED ORDER — LOSARTAN POTASSIUM 100 MG PO TABS: 100.0000 mg | ORAL_TABLET | Freq: Every day | ORAL | 0 refills | Status: DC

## 2019-02-15 MED ORDER — CLOPIDOGREL BISULFATE 75 MG PO TABS: 75.0000 mg | ORAL_TABLET | Freq: Every day | ORAL | 1 refills | Status: DC

## 2019-02-15 MED ORDER — ROSUVASTATIN CALCIUM 10 MG PO TABS: 10.0000 mg | ORAL_TABLET | Freq: Every day | ORAL | 1 refills | Status: DC

## 2019-02-15 MED ORDER — CITALOPRAM HYDROBROMIDE 20 MG PO TABS: 20.0000 mg | ORAL_TABLET | Freq: Every day | ORAL | 1 refills | Status: DC

## 2019-02-15 NOTE — Progress Notes (Signed)
Name: Lisa [name]   MRN: [id number]    DOB: 26-Aug-1945   Date:02/15/2019       Progress Note  Subjective  Chief Complaint  Chief Complaint  Patient presents with  . Diabetes  . Hypertension  . Depression  . Dyslipidemia    HPI  DMII:hgbA1C was7.7%, 7.6%and again at 7.6%, 7.1% 7.6% we will have it rechecked at lab today. She is off Glipizidedaily, she is taking only taking 1000 mg daily , did not go up to 3 daily.No recent episodes of hypoglycemia.Taking ARB for kidney protection.She lost to follow up with vascular surgeon and is on Plavix. She does not want to take medication for phanton syndrome.She denies polyphagia but states she likes to drink water and has urinary frequency,also has to take depends at night.  Urinary incontinence: going on foryears, she told me about in 2019, she is having nocturnal enuresis, she has been wearing depends  at night, last visit urine culture was negative. She was seen by  [facility] back in 03/2018 She cannot afford PT or estrogen therapy . She could not afford PT she is not been using estrogen because she states she never got a prescription. She states that when sitting on a chair she does not have accidents. Discussed sleep study, explained it may be sleep apnea. She states she does not want to have study done.   HTN: she stopped taking losartan past couple of days because she feels it is the cause of her nocturnal enuresis , denies side effects. No chest pain or palpitation . Explained that losartan is not a diuretic   Major Depression in mild:  sheistaking  Citalopram, she states she is always watching the news and it is aggravating her She has been self isolating because of COVID-19 but also because is also sad No suicidal thoughts or ideation.phq today was 4   Vitamin B12 and Vitamin D deficiency: she is back taking supplementations, also takes B12 supplementation, we will recheck labs today   Peripheral neuropathy and phanton  pain: she states pain is continuous,pain is constant on both legs with phantom pain on left foot. Currently pain  Is 5/10 She does not want to take any prescription medication at this time she will continue to take prn Tylenol. She refuses Lyrica or gabapentin ( she does not want more medications and gabapentin did not work in the past)   PVD: shesawDr. Lucky Cowboy in the past but lost to follow up, she is on Plavix, s/p amputation. She no longertakesUltram but sometimes the pain can be intense on her left foot.Compliant with plavix and crestor We will place another referral for her to go back   Dyslipidemia: taking Crestor and denies myalgia. No chest pain.She needs to have repeat labs   Blind right eye: sees ophthalmologist every  6 months follow up, she has cataracts now but not having surgery, Dr. Bascom Levels   Patient Active Problem List   Diagnosis Date Noted  . Uncontrolled type 2 diabetes with eye complications ([place]) 02/77/4128  . DM (diabetes mellitus), type 2 with peripheral vascular complications ([place]) 78/67/6720  . Amputated toe ([place]) 12/02/2014  . Benign essential HTN 12/02/2014  . Depression, major, recurrent, mild ([place]) 12/02/2014  . Dyslipidemia 12/02/2014  . Vitamin B12 deficiency 12/02/2014  . Peripheral neuropathic pain 12/02/2014  . Peripheral artery disease ([place]) 12/02/2014  . Phantom limb ([place]) 12/02/2014  . Diabetes mellitus with nephropathy ([place]) 12/02/2014  . Detached retina 12/02/2014  . Type 2 diabetes mellitus with peripheral  neuropathy ([place]) 12/02/2014  . Tobacco use 12/02/2014  . Urge incontinence 12/02/2014  . 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  . Vitamin D deficiency 12/02/2014    Past Surgical History:  Procedure Laterality Date  . ABDOMINAL HYSTERECTOMY  1992   Total  . EYE SURGERY    . TOE AMPUTATION Left 2011   All of her toes on the left foot  . VASCULAR SURGERY      Family History  Problem Relation Age of Onset  . Heart disease Father   .  Diabetes Mother   . Diabetes Brother   . Kidney disease Brother        Transplant  . Heart disease Brother   . Diabetes Sister   . Diabetes Sister     Social History   Socioeconomic History  . Marital status: Widowed    Spouse name: Not on file  . Number of children: 0  . Years of education: Not on file  . Highest education level: Some college, no degree  Occupational History  . Occupation: Retired  Scientific laboratory technician  . Financial resource strain: Not very hard  . Food insecurity    Worry: Never true    Inability: Never true  . Transportation needs    Medical: No    Non-medical: No  Tobacco Use  . Smoking status: Current Every Day Smoker    Packs/day: 0.25    Years: 40.00    Pack years: 10.00    Types: Cigarettes    Start date: 03/16/2000  . Smokeless tobacco: Never Used  Substance and Sexual Activity  . Alcohol use: No    Alcohol/week: 0.0 standard drinks  . Drug use: No  . Sexual activity: Not Currently  Lifestyle  . Physical activity    Days per week: 3 days    Minutes per session: 20 min  . Stress: Not at all  Relationships  . Social connections    Talks on phone: More than three times a week    Gets together: Once a week    Attends religious service: More than 4 times per year    Active member of club or organization: No    Attends meetings of clubs or organizations: Never    Relationship status: Widowed  . Intimate partner violence    Fear of current or ex partner: No    Emotionally abused: No    Physically abused: No    Forced sexual activity: No  Other Topics Concern  . Not on file  Social History Narrative  . Not on file     Current Outpatient Medications:  .  aspirin 81 MG tablet, Take 81 mg by mouth daily., Disp: , Rfl:  .  atropine 1 % ophthalmic solution, Place 1 drop into the right eye 3 (three) times daily., Disp: , Rfl:  .  blood glucose meter kit and supplies, , Disp: , Rfl:  .  Blood Glucose Monitoring Suppl (ACCU-CHEK AVIVA CONNECT)  w/Device KIT, 1 each by Does not apply route daily. And supplies E11.9 check fsbs bid, Disp: 1 kit, Rfl: 0 .  brimonidine (ALPHAGAN) 0.2 % ophthalmic solution, USE 1 DROP IN BOTH EYE TWICE A DAY, Disp: , Rfl: 3 .  citalopram (CELEXA) 20 MG tablet, Take 1 tablet (20 mg total) by mouth daily., Disp: 90 tablet, Rfl: 1 .  clopidogrel (PLAVIX) 75 MG tablet, Take 1 tablet (75 mg total) by mouth daily., Disp: 90 tablet, Rfl: 1 .  dorzolamide-timolol (COSOPT) 22.3-6.8 MG/ML ophthalmic solution, USE  1 DROP IN BOTH EYES 2 TIMES A DAY, Disp: , Rfl: 4 .  losartan (COZAAR) 100 MG tablet, Take 1 tablet (100 mg total) by mouth daily., Disp: 90 tablet, Rfl: 0 .  metaxalone (SKELAXIN) 800 MG tablet, Take by mouth., Disp: , Rfl:  .  metFORMIN (GLUCOPHAGE-XR) 500 MG 24 hr tablet, Take 2 tablets (1,000 mg total) by mouth daily with breakfast. 1 in the morning and 2 in the evenings, Disp: 180 tablet, Rfl: 0 .  Methylcobalamin (B12-ACTIVE) 1 MG CHEW, Chew 2 tablets by mouth daily., Disp: , Rfl:  .  Multiple Vitamin (MULTIVITAMIN) tablet, Take 1 tablet by mouth daily., Disp: , Rfl:  .  prednisoLONE acetate (PRED FORTE) 1 % ophthalmic suspension, USE 1 DROP IN RIGHT EYE 4 TIMES A DAY, Disp: , Rfl: 3 .  rosuvastatin (CRESTOR) 10 MG tablet, Take 1 tablet (10 mg total) by mouth daily., Disp: 90 tablet, Rfl: 1 .  timolol (BETIMOL) 0.5 % ophthalmic solution, 1 drop., Disp: , Rfl:  .  Vitamin D, Ergocalciferol, (DRISDOL) 1.25 MG (50000 UT) CAPS capsule, Take 1 capsule (50,000 Units total) by mouth every 7 (seven) days., Disp: 8 capsule, Rfl: 1  Allergies  Allergen Reactions  . Other Itching    Nucinta  . Tapentadol Hcl     I personally reviewed active problem list, medication list, allergies, family history, social history, health maintenance with the patient/caregiver today.   ROS  Constitutional: Negative for fever or weight change.  Respiratory: Negative for cough and shortness of breath.   Cardiovascular:  Negative for chest pain or palpitations.  Gastrointestinal: Negative for abdominal pain, no bowel changes.  Musculoskeletal: positive  for gait problem ( from partially amputated left forefoot)  But no  joint swelling.  Skin: Negative for rash.  Neurological: Negative for dizziness or headache.  No other specific complaints in a complete review of systems (except as listed in HPI above).   Objective  Vitals:   02/15/19 1408 02/15/19 1435  BP: (!) 160/100 (!) 156/98  Pulse: 80   Resp: 16   Temp: (!) 97.3 F (36.3 C)   TempSrc: Temporal   Weight: 146 lb 3.2 oz (66.3 kg)   Height: '5\' 5"'$  (1.651 m)      Body mass index is 24.33 kg/m.  Physical Exam  Constitutional: Patient appears well-developed and well-nourished. No distress.  HEENT: head atraumatic, normocephalic, pupils equal and reactive to light Cardiovascular: Normal rate, regular rhythm and normal heart sounds.  No murmur heard. No BLE edema. Pulmonary/Chest: Effort normal and breath sounds normal. No respiratory distress. Abdominal: Soft.  There is no tenderness. Psychiatric: Patient has a normal mood and affect. behavior is normal. Judgment and thought content normal.  PHQ2/9: Depression screen [facility] 2/9 02/15/2019 10/15/2018 07/20/2018 03/16/2018 07/09/2017  Decreased Interest '1 1 1 1 1  '$ Down, Depressed, Hopeless '1 1 1 '$ 0 0  PHQ - 2 Score '2 2 2 1 1  '$ Altered sleeping 1 0 0 0 0  Tired, decreased energy '1 1 3 1 1  '$ Change in appetite 0 0 0 1 0  Feeling bad or failure about yourself  0 0 0 0 0  Trouble concentrating 0 0 0 0 0  Moving slowly or fidgety/restless 0 0 0 0 0  Suicidal thoughts 0 0 0 0 -  PHQ-9 Score '4 3 5 3 2  '$ Difficult doing work/chores Not difficult at all Not difficult at all Not difficult at all Not difficult at all Not difficult at all  phq 9 is positive  Diabetic Foot Exam - Simple   Simple Foot Form Diabetic Foot exam was performed with the following findings: Yes 02/15/2019  2:26 PM  Visual  Inspection See comments: Yes Sensation Testing Intact to touch and monofilament testing bilaterally: Yes Pulse Check See comments: Yes Comments Decrease PD on left foot, s/p ambulation     Fall Risk: Fall Risk  02/15/2019 10/15/2018 07/20/2018 03/16/2018 07/09/2017  Falls in the past year? '1 1 1 '$ Yes No  Number falls in past yr: '1 1 1 2 '$ or more -  Comment - [place] out of bed twice in the same night fell while dreaming - -  Injury with Fall? 0 1 1 Yes -  Comment - - - right leg- hit night stand after falling out of bed -  Follow up - - Falls prevention discussed - -    Functional Status Survey: Is the patient deaf or have difficulty hearing?: No Does the patient have difficulty seeing, even when wearing glasses/contacts?: No Does the patient have difficulty concentrating, remembering, or making decisions?: No Does the patient have difficulty walking or climbing stairs?: Yes Does the patient have difficulty dressing or bathing?: No Does the patient have difficulty doing errands alone such as visiting a doctor's office or shopping?: No   Assessment & Plan  1. Diabetes mellitus type 2 with complications [facility])  Discussed importance of taking medications as prescribed , but she states she will continue taking just one metformin daily   2. Depression, major, recurrent, mild ([place])  Refuses adding or changing medications - citalopram (CELEXA) 20 MG tablet; Take 1 tablet (20 mg total) by mouth daily.  Dispense: 90 tablet; Refill: 1  3. Peripheral artery disease (HCC)  - clopidogrel (PLAVIX) 75 MG tablet; Take 1 tablet (75 mg total) by mouth daily.  Dispense: 90 tablet; Refill: 1 - rosuvastatin (CRESTOR) 10 MG tablet; Take 1 tablet (10 mg total) by mouth daily.  Dispense: 90 tablet; Refill: 1 - Ambulatory referral to Vascular Surgery  4. Dyslipidemia  - Lipid panel - rosuvastatin (CRESTOR) 10 MG tablet; Take 1 tablet (10 mg total) by mouth daily.  Dispense: 90 tablet; Refill: 1  5.  Vitamin D deficiency  - VITAMIN D 25 Hydroxy (Vit-D Deficiency, Fractures)  6. Phantom limb ([place])  stable  7. Benign essential HTN  - COMPLETE METABOLIC PANEL WITH GFR - losartan (COZAAR) 100 MG tablet; Take 1 tablet (100 mg total) by mouth daily.  Dispense: 90 tablet; Refill: 0  Advised to go up on losartan from 25 mg to 50 mg, bp has been high since May   8. Type 2 diabetes mellitus with peripheral neuropathy (HCC)   9. Vitamin B12 deficiency  - CBC with Differential/Platelet - Vitamin B12  10. Amputated toe, left ([place])  - Ambulatory referral to Vascular Surgery  11. Diabetes mellitus with nephropathy (HCC)  - Microalbumin / creatinine urine ratio - Hemoglobin A1c  12. Noncompliance

## 2019-02-16 LAB — COMPLETE METABOLIC PANEL WITH GFR
AG Ratio: 1.6 (calc) (ref 1.0–2.5)
ALT: 10 U/L (ref 6–29)
AST: 13 U/L (ref 10–35)
Albumin: 3.6 g/dL (ref 3.6–5.1)
Alkaline phosphatase (APISO): 70 U/L (ref 37–153)
BUN: 11 mg/dL (ref 7–25)
CO2: 26 mmol/L (ref 20–32)
Calcium: 8.9 mg/dL (ref 8.6–10.4)
Chloride: 110 mmol/L (ref 98–110)
Creat: 0.84 mg/dL (ref 0.60–0.93)
GFR, Est African American: 80 mL/min/{1.73_m2} (ref >=60)
GFR, Est Non African American: 69 mL/min/{1.73_m2} (ref >=60)
Globulin: 2.3 g/dL (calc) (ref 1.9–3.7)
Glucose, Bld: 166 mg/dL — ABNORMAL HIGH (ref 65–99)
Potassium: 3.9 mmol/L (ref 3.5–5.3)
Sodium: 143 mmol/L (ref 135–146)
Total Bilirubin: 0.5 mg/dL (ref 0.2–1.2)
Total Protein: 5.9 g/dL — ABNORMAL LOW (ref 6.1–8.1)

## 2019-02-16 LAB — LIPID PANEL
Cholesterol: 144 mg/dL (ref <200)
HDL: 65 mg/dL (ref >=50)
LDL Cholesterol (Calc): 63 mg/dL (calc)
Non-HDL Cholesterol (Calc): 79 mg/dL (calc) (ref <130)
Total CHOL/HDL Ratio: 2.2 (calc) (ref <5.0)
Triglycerides: 82 mg/dL (ref <150)

## 2019-02-16 LAB — CBC WITH DIFFERENTIAL/PLATELET
Absolute Monocytes: 463 cells/uL (ref 200–950)
Basophils Absolute: 10 cells/uL (ref 0–200)
Basophils Relative: 0.2 %
Eosinophils Absolute: 10 cells/uL — ABNORMAL LOW (ref 15–500)
Eosinophils Relative: 0.2 %
HCT: 38.1 % (ref 35.0–45.0)
Hemoglobin: 11.9 g/dL (ref 11.7–15.5)
Lymphs Abs: 1232 cells/uL (ref 850–3900)
MCH: 26.7 pg — ABNORMAL LOW (ref 27.0–33.0)
MCHC: 31.2 g/dL — ABNORMAL LOW (ref 32.0–36.0)
MCV: 85.4 fL (ref 80.0–100.0)
MPV: 11.8 fL (ref 7.5–12.5)
Monocytes Relative: 8.9 %
Neutro Abs: 3484 cells/uL (ref 1500–7800)
Neutrophils Relative %: 67 %
Platelets: 246 10*3/uL (ref 140–400)
RBC: 4.46 10*6/uL (ref 3.80–5.10)
RDW: 16.3 % — ABNORMAL HIGH (ref 11.0–15.0)
Total Lymphocyte: 23.7 %
WBC: 5.2 10*3/uL (ref 3.8–10.8)

## 2019-02-16 LAB — MICROALBUMIN / CREATININE URINE RATIO
Creatinine, Urine: 259 mg/dL (ref 20–275)
Microalb Creat Ratio: 20 mcg/mg creat (ref <30)
Microalb, Ur: 5.1 mg/dL

## 2019-02-16 LAB — HEMOGLOBIN A1C
Hgb A1c MFr Bld: 10 % of total Hgb — ABNORMAL HIGH (ref <5.7)
Mean Plasma Glucose: 240 (calc)
eAG (mmol/L): 13.3 (calc)

## 2019-02-16 LAB — VITAMIN B12: Vitamin B-12: 598 pg/mL (ref 200–1100)

## 2019-02-16 LAB — VITAMIN D 25 HYDROXY (VIT D DEFICIENCY, FRACTURES): Vit D, 25-Hydroxy: 57 ng/mL (ref 30–100)

## 2019-02-17 DIAGNOSIS — Z23 Encounter for immunization: Secondary | ICD-10-CM

## 2019-02-18 ENCOUNTER — Other Ambulatory Visit: Payer: Self-pay | Admitting: Family Medicine

## 2019-02-18 DIAGNOSIS — I10 Essential (primary) hypertension: Secondary | ICD-10-CM

## 2019-02-18 NOTE — Telephone Encounter (Signed)
Requested medication (s) are due for refill today: no  Requested medication (s) are on the active medication list: no  Last refill:  12/26/2018  Future visit scheduled: yes  Notes to clinic:  Medication was discontinued    Requested Prescriptions  Pending Prescriptions Disp Refills   losartan (COZAAR) 25 MG tablet [Pharmacy Med Name: LOSARTAN POTASSIUM 25 MG TAB] 60 tablet 2    Sig: TAKE 1 TABLET BY MOUTH EVERY DAY     Cardiovascular:  Angiotensin Receptor Blockers Failed - 02/18/2019  1:27 AM      Failed - Last BP in normal range    BP Readings from Last 1 Encounters:  02/15/19 (!) 156/98         Passed - Cr in normal range and within 180 days    Creat  Date Value Ref Range Status  02/15/2019 0.84 0.60 - 0.93 mg/dL Final    Comment:    For patients >79 years of age, the reference limit for Creatinine is approximately 13% higher for people identified as African-American. .          Passed - K in normal range and within 180 days    Potassium  Date Value Ref Range Status  02/15/2019 3.9 3.5 - 5.3 mmol/L Final         Passed - Patient is not pregnant      Passed - Valid encounter within last 6 months    Recent Outpatient Visits          3 days ago Diabetes mellitus type 2 with complications [facility])   [facility] [place], Drue Stager, MD   4 months ago Diabetes mellitus type 2 with complications [facility])   [facility] Steele Sizer, MD   11 months ago Diabetes mellitus type 2 with complications [facility])   [facility] Steele Sizer, MD   1 year ago Uncontrolled type 2 diabetes with eye complications [facility])   [facility] Steele Sizer, MD   2 years ago DM (diabetes mellitus), type 2 with peripheral vascular complications [facility])   [facility] Steele Sizer, MD      Future Appointments            In 4 days Steele Sizer, MD [facility], [place]   In 5 months  Steele Sizer, MD [facility], [facility]

## 2019-02-22 ENCOUNTER — Other Ambulatory Visit: Payer: Self-pay

## 2019-02-22 ENCOUNTER — Ambulatory Visit (INDEPENDENT_AMBULATORY_CARE_PROVIDER_SITE_OTHER): Payer: Medicare Other | Admitting: Family Medicine

## 2019-02-22 ENCOUNTER — Encounter: Payer: Self-pay | Admitting: Family Medicine

## 2019-02-22 VITALS — BP 160/76 | HR 70 | Temp 97.3°F | Resp 16 | Ht 65.0 in | Wt 144.9 lb

## 2019-02-22 DIAGNOSIS — IMO0002 Reserved for concepts with insufficient information to code with codable children: Secondary | ICD-10-CM

## 2019-02-22 DIAGNOSIS — E1159 Type 2 diabetes mellitus with other circulatory complications: Secondary | ICD-10-CM | POA: Diagnosis not present

## 2019-02-22 DIAGNOSIS — E1165 Type 2 diabetes mellitus with hyperglycemia: Secondary | ICD-10-CM

## 2019-02-22 DIAGNOSIS — E118 Type 2 diabetes mellitus with unspecified complications: Secondary | ICD-10-CM | POA: Diagnosis not present

## 2019-02-22 DIAGNOSIS — I10 Essential (primary) hypertension: Secondary | ICD-10-CM | POA: Diagnosis not present

## 2019-02-22 DIAGNOSIS — I152 Hypertension secondary to endocrine disorders: Secondary | ICD-10-CM

## 2019-02-22 LAB — POCT GLUCOSE (DEVICE FOR HOME USE): POC Glucose: 98 mg/dl (ref 70–99)

## 2019-02-22 NOTE — Progress Notes (Signed)
Name: Lisa [name]   MRN: [id number]    DOB: Oct 29, 1945   Date:02/22/2019       Progress Note  Subjective  Chief Complaint  Chief Complaint  Patient presents with  . Results    Here to discuss lab results and treatment options for her elevated blood sugar.    HPI  DMII: she states since last visit she picked up strips and lancets and has been checking glucose daily since she got results of A1C at 10% , it was 7.6% . She states glucose has been 150's. She has changed her diet , she has decreased hard candy intake, used to have it all the time but now she is having about twice day. She will try to switch to sugar free candy . She is also decreasing intake of bread, pasta and rice. She has lost 3 lbs in the past week with dietary modification. She was only taking Metformin one 500 mg tablet daily and is up to 2 daily. Advised to go up to 3 Metformin daily but she wants to hold off for now. We will recheck A1C in 3 months   HTN: her bp was very high when she arrived but gradually got down, she states she is checking her bp at home 150's, she is still only taking 25 mg of Losartan, explained that she needs to go up on losartan dose, at least go up to 50 mg for one week and after that try going up to 100 mg daily . No headaches or dizziness.   Patient Active Problem List   Diagnosis Date Noted  . Uncontrolled type 2 diabetes with eye complications ([place]) 98/92/1194  . DM (diabetes mellitus), type 2 with peripheral vascular complications ([place]) 17/40/8144  . Amputated toe ([place]) 12/02/2014  . Benign essential HTN 12/02/2014  . Depression, major, recurrent, mild ([place]) 12/02/2014  . Dyslipidemia 12/02/2014  . Vitamin B12 deficiency 12/02/2014  . Peripheral neuropathic pain 12/02/2014  . Peripheral artery disease ([place]) 12/02/2014  . Phantom limb ([place]) 12/02/2014  . Diabetes mellitus with nephropathy ([place]) 12/02/2014  . Detached retina 12/02/2014  . Type 2 diabetes mellitus with peripheral  neuropathy ([place]) 12/02/2014  . Tobacco use 12/02/2014  . Urge incontinence 12/02/2014  . 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  . Vitamin D deficiency 12/02/2014    Past Surgical History:  Procedure Laterality Date  . ABDOMINAL HYSTERECTOMY  1992   Total  . EYE SURGERY    . TOE AMPUTATION Left 2011   All of her toes on the left foot  . VASCULAR SURGERY      Family History  Problem Relation Age of Onset  . Heart disease Father   . Diabetes Mother   . Diabetes Brother   . Kidney disease Brother        Transplant  . Heart disease Brother   . Diabetes Sister   . Diabetes Sister     Social History   Socioeconomic History  . Marital status: Widowed    Spouse name: Not on file  . Number of children: 0  . Years of education: Not on file  . Highest education level: Some college, no degree  Occupational History  . Occupation: Retired  Scientific laboratory technician  . Financial resource strain: Not very hard  . Food insecurity    Worry: Never true    Inability: Never true  . Transportation needs    Medical: No    Non-medical: No  Tobacco Use  . Smoking status: Current Every Day  Smoker    Packs/day: 0.25    Years: 40.00    Pack years: 10.00    Types: Cigarettes    Start date: 03/16/2000  . Smokeless tobacco: Never Used  Substance and Sexual Activity  . Alcohol use: No    Alcohol/week: 0.0 standard drinks  . Drug use: No  . Sexual activity: Not Currently  Lifestyle  . Physical activity    Days per week: 3 days    Minutes per session: 20 min  . Stress: Not at all  Relationships  . Social connections    Talks on phone: More than three times a week    Gets together: Once a week    Attends religious service: More than 4 times per year    Active member of club or organization: No    Attends meetings of clubs or organizations: Never    Relationship status: Widowed  . Intimate partner violence    Fear of current or ex partner: No    Emotionally abused: No    Physically abused: No     Forced sexual activity: No  Other Topics Concern  . Not on file  Social History Narrative  . Not on file     Current Outpatient Medications:  .  aspirin 81 MG tablet, Take 81 mg by mouth daily., Disp: , Rfl:  .  atropine 1 % ophthalmic solution, Place 1 drop into the right eye 3 (three) times daily., Disp: , Rfl:  .  blood glucose meter kit and supplies, , Disp: , Rfl:  .  Blood Glucose Monitoring Suppl (ACCU-CHEK AVIVA CONNECT) w/Device KIT, 1 each by Does not apply route daily. And supplies E11.9 check fsbs bid, Disp: 1 kit, Rfl: 0 .  brimonidine (ALPHAGAN) 0.2 % ophthalmic solution, USE 1 DROP IN BOTH EYE TWICE A DAY, Disp: , Rfl: 3 .  citalopram (CELEXA) 20 MG tablet, Take 1 tablet (20 mg total) by mouth daily., Disp: 90 tablet, Rfl: 1 .  clopidogrel (PLAVIX) 75 MG tablet, Take 1 tablet (75 mg total) by mouth daily., Disp: 90 tablet, Rfl: 1 .  dorzolamide-timolol (COSOPT) 22.3-6.8 MG/ML ophthalmic solution, USE 1 DROP IN BOTH EYES 2 TIMES A DAY, Disp: , Rfl: 4 .  losartan (COZAAR) 100 MG tablet, Take 1 tablet (100 mg total) by mouth daily., Disp: 90 tablet, Rfl: 0 .  metFORMIN (GLUCOPHAGE-XR) 500 MG 24 hr tablet, Take 2 tablets (1,000 mg total) by mouth daily with breakfast. 1 in the morning and 2 in the evenings, Disp: 180 tablet, Rfl: 0 .  Methylcobalamin (B12-ACTIVE) 1 MG CHEW, Chew 2 tablets by mouth daily., Disp: , Rfl:  .  Multiple Vitamin (MULTIVITAMIN) tablet, Take 1 tablet by mouth daily., Disp: , Rfl:  .  prednisoLONE acetate (PRED FORTE) 1 % ophthalmic suspension, USE 1 DROP IN RIGHT EYE 4 TIMES A DAY, Disp: , Rfl: 3 .  rosuvastatin (CRESTOR) 10 MG tablet, Take 1 tablet (10 mg total) by mouth daily., Disp: 90 tablet, Rfl: 1 .  timolol (BETIMOL) 0.5 % ophthalmic solution, 1 drop., Disp: , Rfl:  .  Vitamin D, Ergocalciferol, (DRISDOL) 1.25 MG (50000 UT) CAPS capsule, Take 1 capsule (50,000 Units total) by mouth every 7 (seven) days., Disp: 8 capsule, Rfl: 1 .  metaxalone  (SKELAXIN) 800 MG tablet, Take by mouth., Disp: , Rfl:   Allergies  Allergen Reactions  . Other Itching    Nucinta  . Tapentadol Hcl     I personally reviewed active problem list, medication  list, allergies, family history, social history with the patient/caregiver today.   ROS  Ten systems reviewed and is negative except as mentioned in HPI   Objective  Vitals:   02/22/19 1428 02/22/19 1429 02/22/19 1459 02/22/19 1516  BP: (!) 180/90 (!) 178/82 (!) 152/78 (!) 160/76  Pulse:      Resp:      Temp:      TempSrc:      SpO2:      Weight:      Height:        Body mass index is 24.11 kg/m.  Physical Exam  Constitutional: Patient appears well-developed and well-nourished.  No distress.  HEENT: head atraumatic, normocephalic Cardiovascular: Normal rate, regular rhythm and normal heart sounds.  No murmur heard. No BLE edema. Pulmonary/Chest: Effort normal and breath sounds normal. No respiratory distress. Abdominal: Soft.  There is no tenderness. Psychiatric: Patient has a normal mood and affect. behavior is normal. Judgment and thought content normal.  Recent Results (from the past 2160 hour(s))  Lipid panel     Status: None   Collection Time: 02/15/19 12:00 AM  Result Value Ref Range   Cholesterol 144 <200 mg/dL   HDL 65 > OR = 50 mg/dL   Triglycerides 82 <150 mg/dL   LDL Cholesterol (Calc) 63 mg/dL (calc)    Comment: Reference range: <100 . Desirable range <100 mg/dL for primary prevention;   <70 mg/dL for patients with CHD or diabetic patients  with > or = 2 CHD risk factors. Marland Kitchen LDL-C is now calculated using the Martin-Hopkins  calculation, which is a validated novel method providing  better accuracy than the Friedewald equation in the  estimation of LDL-C.  Cresenciano Genre et al. Annamaria Helling. 2563;893(73): 2061-2068  (http://education.[facility].com/faq/FAQ164)    Total CHOL/HDL Ratio 2.2 <5.0 (calc)   Non-HDL Cholesterol (Calc) 79 <130 mg/dL (calc)    Comment:  For patients with diabetes plus 1 major ASCVD risk  factor, treating to a non-HDL-C goal of <100 mg/dL  (LDL-C of <70 mg/dL) is considered a therapeutic  option.   Microalbumin / creatinine urine ratio     Status: None   Collection Time: 02/15/19 12:00 AM  Result Value Ref Range   Creatinine, Urine 259 20 - 275 mg/dL   Microalb, Ur 5.1 mg/dL    Comment: Reference Range Not established    Microalb Creat Ratio 20 <30 mcg/mg creat    Comment: . The ADA defines abnormalities in albumin excretion as follows: Marland Kitchen Category         Result (mcg/mg creatinine) . Normal                    <30 Microalbuminuria         30-299  Clinical albuminuria   > OR = 300 . The ADA recommends that at least two of three specimens collected within a 3-6 month period be abnormal before considering a patient to be within a diagnostic category.   COMPLETE METABOLIC PANEL WITH GFR     Status: Abnormal   Collection Time: 02/15/19 12:00 AM  Result Value Ref Range   Glucose, Bld 166 (H) 65 - 99 mg/dL    Comment: .            Fasting reference interval . For someone without known diabetes, a glucose value >125 mg/dL indicates that they may have diabetes and this should be confirmed with a follow-up test. .    BUN 11 7 - 25 mg/dL  Creat 0.84 0.60 - 0.93 mg/dL    Comment: For patients >90 years of age, the reference limit for Creatinine is approximately 13% higher for people identified as African-American. .    GFR, Est Non African American 69 > OR = 60 mL/min/1.66m   GFR, Est African American 80 > OR = 60 mL/min/1.755m  BUN/Creatinine Ratio NOT APPLICABLE 6 - 22 (calc)   Sodium 143 135 - 146 mmol/L   Potassium 3.9 3.5 - 5.3 mmol/L   Chloride 110 98 - 110 mmol/L   CO2 26 20 - 32 mmol/L   Calcium 8.9 8.6 - 10.4 mg/dL   Total Protein 5.9 (L) 6.1 - 8.1 g/dL   Albumin 3.6 3.6 - 5.1 g/dL   Globulin 2.3 1.9 - 3.7 g/dL (calc)   AG Ratio 1.6 1.0 - 2.5 (calc)   Total Bilirubin 0.5 0.2 - 1.2 mg/dL    Alkaline phosphatase (APISO) 70 37 - 153 U/L   AST 13 10 - 35 U/L   ALT 10 6 - 29 U/L  CBC with Differential/Platelet     Status: Abnormal   Collection Time: 02/15/19 12:00 AM  Result Value Ref Range   WBC 5.2 3.8 - 10.8 Thousand/uL   RBC 4.46 3.80 - 5.10 Million/uL   Hemoglobin 11.9 11.7 - 15.5 g/dL   HCT 38.1 35.0 - 45.0 %   MCV 85.4 80.0 - 100.0 fL   MCH 26.7 (L) 27.0 - 33.0 pg   MCHC 31.2 (L) 32.0 - 36.0 g/dL   RDW 16.3 (H) 11.0 - 15.0 %   Platelets 246 140 - 400 Thousand/uL   MPV 11.8 7.5 - 12.5 fL   Neutro Abs 3,484 1,500 - 7,800 cells/uL   Lymphs Abs 1,232 850 - 3,900 cells/uL   Absolute Monocytes 463 200 - 950 cells/uL   Eosinophils Absolute 10 (L) 15 - 500 cells/uL   Basophils Absolute 10 0 - 200 cells/uL   Neutrophils Relative % 67 %   Total Lymphocyte 23.7 %   Monocytes Relative 8.9 %   Eosinophils Relative 0.2 %   Basophils Relative 0.2 %  Vitamin B12     Status: None   Collection Time: 02/15/19 12:00 AM  Result Value Ref Range   Vitamin B-12 598 200 - 1,100 pg/mL  VITAMIN D 25 Hydroxy (Vit-D Deficiency, Fractures)     Status: None   Collection Time: 02/15/19 12:00 AM  Result Value Ref Range   Vit D, 25-Hydroxy 57 30 - 100 ng/mL    Comment: Vitamin D Status         25-OH Vitamin D: . Deficiency:                    <20 ng/mL Insufficiency:             20 - 29 ng/mL Optimal:                 > or = 30 ng/mL . For 25-OH Vitamin D testing on patients on  D2-supplementation and patients for whom quantitation  of D2 and D3 fractions is required, the [facility](TM) 25-OH VIT D, (D2,D3), LC/MS/MS is recommended: order  code 92650-348-0201patients >2y65yr See Note 1 . Note 1 . For additional information, please refer to  http://education.[facility].com/faq/FAQ199  (This link is being provided for informational/ educational purposes only.)   Hemoglobin A1c     Status: Abnormal   Collection Time: 02/15/19 12:00 AM  Result Value Ref Range   Hgb A1c MFr Bld  10.0 (H) <5.7 % of total Hgb    Comment: For someone without known diabetes, a hemoglobin A1c value of 6.5% or greater indicates that they may have  diabetes and this should be confirmed with a follow-up  test. . For someone with known diabetes, a value <7% indicates  that their diabetes is well controlled and a value  greater than or equal to 7% indicates suboptimal  control. A1c targets should be individualized based on  duration of diabetes, age, comorbid conditions, and  other considerations. . Currently, no consensus exists regarding use of hemoglobin A1c for diagnosis of diabetes for children. .    Mean Plasma Glucose 240 (calc)   eAG (mmol/L) 13.3 (calc)  POCT Glucose (Device for Home Use)     Status: Normal   Collection Time: 02/22/19  3:16 PM  Result Value Ref Range   Glucose Fasting, POC     POC Glucose 98 70 - 99 mg/dl      PHQ2/9: Depression screen [facility] 2/9 02/15/2019 10/15/2018 07/20/2018 03/16/2018 07/09/2017  Decreased Interest _0 Down, Depressed, Hopeless _1 0 0  PHQ - 2 Score _2 Altered sleeping 1 0 0 0 0  Tired, decreased energy _3 Change in appetite 0 0 0 1 0  Feeling bad or failure about yourself  0 0 0 0 0  Trouble concentrating 0 0 0 0 0  Moving slowly or fidgety/restless 0 0 0 0 0  Suicidal thoughts 0 0 0 0 -  PHQ-9 Score _4 Difficult doing work/chores Not difficult at all Not difficult at all Not difficult at all Not difficult at all Not difficult at all    phq 9 is positive   Fall Risk: Fall Risk  02/22/2019 02/15/2019 10/15/2018 07/20/2018 03/16/2018  Falls in the past year? _5 Yes  Number falls in past yr: _6 or more  Comment - - [place] out of bed twice in the same night fell while dreaming -  Injury with Fall? 0 0 1 1 Yes  Comment - - - - right leg- hit night stand after falling out of bed  Follow up - - - Falls prevention discussed -     Functional Status Survey: Is the patient deaf or have  difficulty hearing?: No Does the patient have difficulty seeing, even when wearing glasses/contacts?: No Does the patient have difficulty concentrating, remembering, or making decisions?: No Does the patient have difficulty walking or climbing stairs?: Yes Does the patient have difficulty dressing or bathing?: No Does the patient have difficulty doing errands alone such as visiting a doctor's office or shopping?: No    Assessment & Plan  1. Uncontrolled type 2 diabetes mellitus with complication ([facility])  Discussed going up to 3 Metformins daily, continue life style modification and return for A1C as previously discussed   2. Uncontrolled hypertension  She needs to go up on Losartan to 100 mg daily   3. Hypertension associated with diabetes ([place])  As discussed

## 2019-03-22 ENCOUNTER — Encounter (INDEPENDENT_AMBULATORY_CARE_PROVIDER_SITE_OTHER): Payer: Medicare Other

## 2019-03-22 ENCOUNTER — Ambulatory Visit (INDEPENDENT_AMBULATORY_CARE_PROVIDER_SITE_OTHER): Payer: Medicare Other | Admitting: Vascular Surgery

## 2019-03-22 ENCOUNTER — Encounter (INDEPENDENT_AMBULATORY_CARE_PROVIDER_SITE_OTHER): Payer: Self-pay

## 2019-03-22 ENCOUNTER — Encounter (INDEPENDENT_AMBULATORY_CARE_PROVIDER_SITE_OTHER): Payer: Self-pay | Admitting: Vascular Surgery

## 2019-03-22 ENCOUNTER — Other Ambulatory Visit: Payer: Self-pay

## 2019-03-22 ENCOUNTER — Other Ambulatory Visit (INDEPENDENT_AMBULATORY_CARE_PROVIDER_SITE_OTHER): Payer: Self-pay | Admitting: Vascular Surgery

## 2019-03-22 VITALS — BP 156/80 | HR 76 | Resp 16 | Wt 146.6 lb

## 2019-03-22 DIAGNOSIS — I739 Peripheral vascular disease, unspecified: Secondary | ICD-10-CM

## 2019-03-22 DIAGNOSIS — E1151 Type 2 diabetes mellitus with diabetic peripheral angiopathy without gangrene: Secondary | ICD-10-CM | POA: Diagnosis not present

## 2019-03-22 DIAGNOSIS — I10 Essential (primary) hypertension: Secondary | ICD-10-CM | POA: Diagnosis not present

## 2019-03-22 DIAGNOSIS — E785 Hyperlipidemia, unspecified: Secondary | ICD-10-CM | POA: Diagnosis not present

## 2019-03-22 NOTE — Assessment & Plan Note (Signed)
blood glucose control important in reducing the progression of atherosclerotic disease. Also, involved in wound healing. On appropriate medications.  

## 2019-03-22 NOTE — Patient Instructions (Signed)
Peripheral Vascular Disease  Peripheral vascular disease (PVD) is a disease of the blood vessels that are not part of your heart and brain. A simple term for PVD is poor circulation. In most cases, PVD narrows the blood vessels that carry blood from your heart to the rest of your body. This can reduce the supply of blood to your arms, legs, and internal organs, like your stomach or kidneys. However, PVD most often affects a person's lower legs and feet. Without treatment, PVD tends to get worse. PVD can also lead to acute ischemic limb. This is when an arm or leg suddenly cannot get enough blood. This is a medical emergency. Follow these instructions at home: Lifestyle  Do not use any products that contain nicotine or tobacco, such as cigarettes and e-cigarettes. If you need help quitting, ask your doctor.  Lose weight if you are overweight. Or, stay at a healthy weight as told by your doctor.  Eat a diet that is low in fat and cholesterol. If you need help, ask your doctor.  Exercise regularly. Ask your doctor for activities that are right for you. General instructions  Take over-the-counter and prescription medicines only as told by your doctor.  Take good care of your feet: ? Wear comfortable shoes that fit well. ? Check your feet often for any cuts or sores.  Keep all follow-up visits as told by your doctor This is important. Contact a doctor if:  You have cramps in your legs when you walk.  You have leg pain when you are at rest.  You have coldness in a leg or foot.  Your skin changes.  You are unable to get or have an erection (erectile dysfunction).  You have cuts or sores on your feet that do not heal. Get help right away if:  Your arm or leg turns cold, numb, and blue.  Your arms or legs become red, warm, swollen, painful, or numb.  You have chest pain.  You have trouble breathing.  You suddenly have weakness in your face, arm, or leg.  You become very  confused or you cannot speak.  You suddenly have a very bad headache.  You suddenly cannot see. Summary  Peripheral vascular disease (PVD) is a disease of the blood vessels.  A simple term for PVD is poor circulation. Without treatment, PVD tends to get worse.  Treatment may include exercise, low fat and low cholesterol diet, and quitting smoking. This information is not intended to replace advice given to you by your health care provider. Make sure you discuss any questions you have with your health care provider. Document Released: 08/06/2009 Document Revised: 04/24/2017 Document Reviewed: 06/19/2016 Elsevier Patient Education  2020 [employer]  

## 2019-03-22 NOTE — Assessment & Plan Note (Signed)
lipid control important in reducing the progression of atherosclerotic disease. Continue statin therapy  

## 2019-03-22 NOTE — Assessment & Plan Note (Signed)
blood pressure control important in reducing the progression of atherosclerotic disease. On appropriate oral medications.  

## 2019-03-22 NOTE — Progress Notes (Signed)
[name], female   DOB: [birth year], 73 y.o.   MRN: [id number]  Chief Complaint  Patient presents with  . New Patient (Initial Visit)    ref [place] for PAD    HPI Lisa [name] is a 73 y.o. female.  I am asked to see the patient by Dr. Ancil Boozer for evaluation of her known PAD.  The patient is well-known to me but has not been seen in our clinic in over 3 years.  She has previously undergone a left transmetatarsal amputation and left lower extremity revascularization some 8 years ago.  She has had some chronic neuropathic pain in that left foot and leg for many years.  She has had no recent ulceration, infection, or gangrenous changes to her feet.  She does say that recently, her blood pressure and glucose control have been suboptimal.     Past Medical History:  Diagnosis Date  . Blindness of right eye   . Depression   . Diabetes mellitus without complication ([place])   . DVT of leg (deep venous thrombosis) ([place])   . Glaucoma   . Hyperlipidemia   . Hypertension   . Vascular disease     Past Surgical History:  Procedure Laterality Date  . ABDOMINAL HYSTERECTOMY  1992   Total  . EYE SURGERY    . TOE AMPUTATION Left 2011   All of her toes on the left foot  . VASCULAR SURGERY      Family History Family History  Problem Relation Age of Onset  . Heart disease Father   . Diabetes Mother   . Diabetes Brother   . Kidney disease Brother        Transplant  . Heart disease Brother   . Diabetes Sister   . Diabetes Sister     Social History Social History   Tobacco Use  . Smoking status: Current Every Day Smoker    Packs/day: 0.25    Years: 40.00    Pack years: 10.00    Types: Cigarettes    Start date: 03/16/2000  . Smokeless tobacco: Never Used  Substance Use Topics  . Alcohol use: No    Alcohol/week: 0.0 standard drinks  . Drug use: No    Allergies  Allergen Reactions  . Other Itching    Nucinta  . Tapentadol Hcl     Current Outpatient  Medications  Medication Sig Dispense Refill  . aspirin 81 MG tablet Take 81 mg by mouth daily.    Marland Kitchen atropine 1 % ophthalmic solution Place 1 drop into the right eye 3 (three) times daily.    . blood glucose meter kit and supplies     . Blood Glucose Monitoring Suppl (ACCU-CHEK AVIVA CONNECT) w/Device KIT 1 each by Does not apply route daily. And supplies E11.9 check fsbs bid 1 kit 0  . brimonidine (ALPHAGAN) 0.2 % ophthalmic solution USE 1 DROP IN BOTH EYE TWICE A DAY  3  . citalopram (CELEXA) 20 MG tablet Take 1 tablet (20 mg total) by mouth daily. 90 tablet 1  . clopidogrel (PLAVIX) 75 MG tablet Take 1 tablet (75 mg total) by mouth daily. 90 tablet 1  . dorzolamide-timolol (COSOPT) 22.3-6.8 MG/ML ophthalmic solution USE 1 DROP IN BOTH EYES 2 TIMES A DAY  4  . losartan (COZAAR) 100 MG tablet Take 1 tablet (100 mg total) by mouth daily. 90 tablet 0  . metaxalone (SKELAXIN) 800 MG tablet Take by mouth.    . metFORMIN (GLUCOPHAGE-XR) 500  MG 24 hr tablet Take 2 tablets (1,000 mg total) by mouth daily with breakfast. 1 in the morning and 2 in the evenings 180 tablet 0  . Methylcobalamin (B12-ACTIVE) 1 MG CHEW Chew 2 tablets by mouth daily.    . Multiple Vitamin (MULTIVITAMIN) tablet Take 1 tablet by mouth daily.    . prednisoLONE acetate (PRED FORTE) 1 % ophthalmic suspension USE 1 DROP IN RIGHT EYE 4 TIMES A DAY  3  . rosuvastatin (CRESTOR) 10 MG tablet Take 1 tablet (10 mg total) by mouth daily. 90 tablet 1  . timolol (BETIMOL) 0.5 % ophthalmic solution 1 drop.    . Vitamin D, Ergocalciferol, (DRISDOL) 1.25 MG (50000 UT) CAPS capsule Take 1 capsule (50,000 Units total) by mouth every 7 (seven) days. 8 capsule 1   No current facility-administered medications for this visit.       REVIEW OF SYSTEMS (Negative unless checked)  Constitutional: '[]'$ Weight loss  '[]'$ Fever  '[]'$ Chills Cardiac: '[]'$ Chest pain   '[]'$ Chest pressure   '[]'$ Palpitations   '[]'$ Shortness of breath when laying flat   '[]'$ Shortness of  breath at rest   '[]'$ Shortness of breath with exertion. Vascular:  '[]'$ Pain in legs with walking   '[]'$ Pain in legs at rest   '[]'$ Pain in legs when laying flat   '[]'$ Claudication   '[]'$ Pain in feet when walking  '[]'$ Pain in feet at rest  '[]'$ Pain in feet when laying flat   '[]'$ History of DVT   '[]'$ Phlebitis   '[]'$ Swelling in legs   '[]'$ Varicose veins   '[]'$ Non-healing ulcers Pulmonary:   '[]'$ Uses home oxygen   '[]'$ Productive cough   '[]'$ Hemoptysis   '[]'$ Wheeze  '[]'$ COPD   '[]'$ Asthma Neurologic:  '[]'$ Dizziness  '[]'$ Blackouts   '[]'$ Seizures   '[]'$ History of stroke   '[]'$ History of TIA  '[]'$ Aphasia   '[]'$ Temporary blindness   '[]'$ Dysphagia   '[]'$ Weakness or numbness in arms   '[]'$ Weakness or numbness in legs Musculoskeletal:  '[]'$ Arthritis   '[]'$ Joint swelling   '[]'$ Joint pain   '[]'$ Low back pain Hematologic:  '[]'$ Easy bruising  '[]'$ Easy bleeding   '[]'$ Hypercoagulable state   '[]'$ Anemic  '[]'$ Hepatitis Gastrointestinal:  '[]'$ Blood in stool   '[]'$ Vomiting blood  '[]'$ Gastroesophageal reflux/heartburn   '[]'$ Abdominal pain Genitourinary:  '[]'$ Chronic kidney disease   '[]'$ Difficult urination  '[]'$ Frequent urination  '[]'$ Burning with urination   '[]'$ Hematuria Skin:  '[]'$ Rashes   '[]'$ Ulcers   '[]'$ Wounds Psychological:  '[]'$ History of anxiety   '[]'$  History of major depression.    Physical Exam BP (!) 156/80 (BP Location: Right Arm)   Pulse 76   Resp 16   Wt 146 lb 9.6 oz (66.5 kg)   BMI 24.40 kg/m  Gen:  WD/WN, NAD Head: [place]/AT, No temporalis wasting.  Ear/Nose/Throat: Hearing grossly intact, nares w/o erythema or drainage, oropharynx w/o Erythema/Exudate Eyes: Conjunctiva clear, sclera non-icteric  Neck: trachea midline.  No JVD.  Pulmonary:  Good air movement, respirations not labored, no use of accessory muscles  Cardiac: RRR, no JVD Vascular:  Vessel Right Left  Radial Palpable Palpable                          DP  2+  1+  PT  trace  trace   Gastrointestinal:. No masses, surgical incisions, or scars. Musculoskeletal: M/S 5/5 throughout.  Extremities without ischemic changes.  No  deformity or atrophy.  Well-healed left transmetatarsal amputation.  Mild bilateral lower extremity edema. Neurologic: Sensation grossly intact in extremities.  Symmetrical.  Speech is fluent. Motor exam as listed above. Psychiatric:  Judgment intact, Mood & affect appropriate for pt's clinical situation. Dermatologic: No rashes or ulcers noted.  No cellulitis or open wounds.    Radiology No results found.  Labs Recent Results (from the past 2160 hour(s))  Lipid panel     Status: None   Collection Time: 02/15/19 12:00 AM  Result Value Ref Range   Cholesterol 144 <200 mg/dL   HDL 65 > OR = 50 mg/dL   Triglycerides 82 <150 mg/dL   LDL Cholesterol (Calc) 63 mg/dL (calc)    Comment: Reference range: <100 . Desirable range <100 mg/dL for primary prevention;   <70 mg/dL for patients with CHD or diabetic patients  with > or = 2 CHD risk factors. Marland Kitchen LDL-C is now calculated using the Martin-Hopkins  calculation, which is a validated novel method providing  better accuracy than the Friedewald equation in the  estimation of LDL-C.  Cresenciano Genre et al. Annamaria Helling. 1856;314(97): 2061-2068  (http://education.[facility].com/faq/FAQ164)    Total CHOL/HDL Ratio 2.2 <5.0 (calc)   Non-HDL Cholesterol (Calc) 79 <130 mg/dL (calc)    Comment: For patients with diabetes plus 1 major ASCVD risk  factor, treating to a non-HDL-C goal of <100 mg/dL  (LDL-C of <70 mg/dL) is considered a therapeutic  option.   Microalbumin / creatinine urine ratio     Status: None   Collection Time: 02/15/19 12:00 AM  Result Value Ref Range   Creatinine, Urine 259 20 - 275 mg/dL   Microalb, Ur 5.1 mg/dL    Comment: Reference Range Not established    Microalb Creat Ratio 20 <30 mcg/mg creat    Comment: . The ADA defines abnormalities in albumin excretion as follows: Marland Kitchen Category         Result (mcg/mg creatinine) . Normal                    <30 Microalbuminuria         30-299  Clinical albuminuria   > OR = 300 .  The ADA recommends that at least two of three specimens collected within a 3-6 month period be abnormal before considering a patient to be within a diagnostic category.   COMPLETE METABOLIC PANEL WITH GFR     Status: Abnormal   Collection Time: 02/15/19 12:00 AM  Result Value Ref Range   Glucose, Bld 166 (H) 65 - 99 mg/dL    Comment: .            Fasting reference interval . For someone without known diabetes, a glucose value >125 mg/dL indicates that they may have diabetes and this should be confirmed with a follow-up test. .    BUN 11 7 - 25 mg/dL   Creat 0.84 0.60 - 0.93 mg/dL    Comment: For patients >18 years of age, the reference limit for Creatinine is approximately 13% higher for people identified as African-American. .    GFR, Est Non African American 69 > OR = 60 mL/min/1.65m   GFR, Est African American 80 > OR = 60 mL/min/1.757m  BUN/Creatinine Ratio NOT APPLICABLE 6 - 22 (calc)   Sodium 143 135 - 146 mmol/L   Potassium 3.9 3.5 - 5.3 mmol/L   Chloride 110 98 - 110 mmol/L   CO2 26 20 - 32 mmol/L   Calcium 8.9 8.6 - 10.4 mg/dL   Total Protein 5.9 (L) 6.1 - 8.1 g/dL   Albumin 3.6 3.6 - 5.1 g/dL   Globulin 2.3 1.9 - 3.7 g/dL (calc)   AG  Ratio 1.6 1.0 - 2.5 (calc)   Total Bilirubin 0.5 0.2 - 1.2 mg/dL   Alkaline phosphatase (APISO) 70 37 - 153 U/L   AST 13 10 - 35 U/L   ALT 10 6 - 29 U/L  CBC with Differential/Platelet     Status: Abnormal   Collection Time: 02/15/19 12:00 AM  Result Value Ref Range   WBC 5.2 3.8 - 10.8 Thousand/uL   RBC 4.46 3.80 - 5.10 Million/uL   Hemoglobin 11.9 11.7 - 15.5 g/dL   HCT 38.1 35.0 - 45.0 %   MCV 85.4 80.0 - 100.0 fL   MCH 26.7 (L) 27.0 - 33.0 pg   MCHC 31.2 (L) 32.0 - 36.0 g/dL   RDW 16.3 (H) 11.0 - 15.0 %   Platelets 246 140 - 400 Thousand/uL   MPV 11.8 7.5 - 12.5 fL   Neutro Abs 3,484 1,500 - 7,800 cells/uL   Lymphs Abs 1,232 850 - 3,900 cells/uL   Absolute Monocytes 463 200 - 950 cells/uL   Eosinophils Absolute 10  (L) 15 - 500 cells/uL   Basophils Absolute 10 0 - 200 cells/uL   Neutrophils Relative % 67 %   Total Lymphocyte 23.7 %   Monocytes Relative 8.9 %   Eosinophils Relative 0.2 %   Basophils Relative 0.2 %  Vitamin B12     Status: None   Collection Time: 02/15/19 12:00 AM  Result Value Ref Range   Vitamin B-12 598 200 - 1,100 pg/mL  VITAMIN D 25 Hydroxy (Vit-D Deficiency, Fractures)     Status: None   Collection Time: 02/15/19 12:00 AM  Result Value Ref Range   Vit D, 25-Hydroxy 57 30 - 100 ng/mL    Comment: Vitamin D Status         25-OH Vitamin D: . Deficiency:                    <20 ng/mL Insufficiency:             20 - 29 ng/mL Optimal:                 > or = 30 ng/mL . For 25-OH Vitamin D testing on patients on  D2-supplementation and patients for whom quantitation  of D2 and D3 fractions is required, the [facility](TM) 25-OH VIT D, (D2,D3), LC/MS/MS is recommended: order  code [phone] (patients >48yr). See Note 1 . Note 1 . For additional information, please refer to  http://education.[facility].com/faq/FAQ199  (This link is being provided for informational/ educational purposes only.)   Hemoglobin A1c     Status: Abnormal   Collection Time: 02/15/19 12:00 AM  Result Value Ref Range   Hgb A1c MFr Bld 10.0 (H) <5.7 % of total Hgb    Comment: For someone without known diabetes, a hemoglobin A1c value of 6.5% or greater indicates that they may have  diabetes and this should be confirmed with a follow-up  test. . For someone with known diabetes, a value <7% indicates  that their diabetes is well controlled and a value  greater than or equal to 7% indicates suboptimal  control. A1c targets should be individualized based on  duration of diabetes, age, comorbid conditions, and  other considerations. . Currently, no consensus exists regarding use of hemoglobin A1c for diagnosis of diabetes for children. .    Mean Plasma Glucose 240 (calc)   eAG (mmol/L) 13.3  (calc)  POCT Glucose (Device for Home Use)     Status: Normal  Collection Time: 02/22/19  3:16 PM  Result Value Ref Range   Glucose Fasting, POC     POC Glucose 98 70 - 99 mg/dl    Assessment/Plan:  Benign essential HTN blood pressure control important in reducing the progression of atherosclerotic disease. On appropriate oral medications.   DM (diabetes mellitus), type 2 with peripheral vascular complications (HCC) blood glucose control important in reducing the progression of atherosclerotic disease. Also, involved in wound healing. On appropriate medications.   Dyslipidemia lipid control important in reducing the progression of atherosclerotic disease. Continue statin therapy   Peripheral artery disease As it has been over 3 years since she was assessed with noninvasive studies, these were done today.  Her ABIs were stable from her studies back in 2017.  Her right ABI 0.77.  Her left ABI 0.97 although this may be somewhat falsely elevated with medial calcification.  Overall, her perfusion appears to be reasonably well intact.  She is a patient with multiple atherosclerotic risk factors and high risk for progression over time, so I will plan to keep her on a 30-monthfollow-up interval at this point      JLeotis Pain10/27/2020, 3:33 PM   This note was created with Dragon medical transcription system.  Any errors from dictation are unintentional.

## 2019-03-22 NOTE — Assessment & Plan Note (Signed)
As it has been over 3 years since she was assessed with noninvasive studies, these were done today.  Her ABIs were stable from her studies back in 2017.  Her right ABI 0.77.  Her left ABI 0.97 although this may be somewhat falsely elevated with medial calcification.  Overall, her perfusion appears to be reasonably well intact.  She is a patient with multiple atherosclerotic risk factors and high risk for progression over time, so I will plan to keep her on a 37-month follow-up interval at this point

## 2019-04-10 ENCOUNTER — Other Ambulatory Visit: Payer: Self-pay | Admitting: Family Medicine

## 2019-04-10 DIAGNOSIS — E559 Vitamin D deficiency, unspecified: Secondary | ICD-10-CM

## 2019-05-13 ENCOUNTER — Other Ambulatory Visit: Payer: Self-pay | Admitting: Family Medicine

## 2019-05-13 DIAGNOSIS — I10 Essential (primary) hypertension: Secondary | ICD-10-CM

## 2019-07-10 DIAGNOSIS — Z23 Encounter for immunization: Secondary | ICD-10-CM | POA: Diagnosis not present

## 2019-08-04 DIAGNOSIS — H401113 Primary open-angle glaucoma, right eye, severe stage: Secondary | ICD-10-CM | POA: Diagnosis not present

## 2019-08-04 LAB — HM DIABETES EYE EXAM

## 2019-08-08 DIAGNOSIS — Z23 Encounter for immunization: Secondary | ICD-10-CM | POA: Diagnosis not present

## 2019-08-16 ENCOUNTER — Other Ambulatory Visit: Payer: Self-pay

## 2019-08-16 ENCOUNTER — Encounter: Payer: Self-pay | Admitting: Family Medicine

## 2019-08-16 ENCOUNTER — Ambulatory Visit (INDEPENDENT_AMBULATORY_CARE_PROVIDER_SITE_OTHER): Payer: Medicare Other | Admitting: Family Medicine

## 2019-08-16 VITALS — BP 130/80 | HR 81 | Temp 97.1°F | Resp 14 | Ht 65.0 in | Wt 144.8 lb

## 2019-08-16 DIAGNOSIS — I739 Peripheral vascular disease, unspecified: Secondary | ICD-10-CM

## 2019-08-16 DIAGNOSIS — E1121 Type 2 diabetes mellitus with diabetic nephropathy: Secondary | ICD-10-CM | POA: Diagnosis not present

## 2019-08-16 DIAGNOSIS — S98132A Complete traumatic amputation of one left lesser toe, initial encounter: Secondary | ICD-10-CM

## 2019-08-16 DIAGNOSIS — E559 Vitamin D deficiency, unspecified: Secondary | ICD-10-CM | POA: Diagnosis not present

## 2019-08-16 DIAGNOSIS — E785 Hyperlipidemia, unspecified: Secondary | ICD-10-CM | POA: Diagnosis not present

## 2019-08-16 DIAGNOSIS — E1165 Type 2 diabetes mellitus with hyperglycemia: Secondary | ICD-10-CM

## 2019-08-16 DIAGNOSIS — F330 Major depressive disorder, recurrent, mild: Secondary | ICD-10-CM | POA: Diagnosis not present

## 2019-08-16 DIAGNOSIS — E1159 Type 2 diabetes mellitus with other circulatory complications: Secondary | ICD-10-CM | POA: Diagnosis not present

## 2019-08-16 DIAGNOSIS — E118 Type 2 diabetes mellitus with unspecified complications: Secondary | ICD-10-CM

## 2019-08-16 DIAGNOSIS — E538 Deficiency of other specified B group vitamins: Secondary | ICD-10-CM | POA: Diagnosis not present

## 2019-08-16 DIAGNOSIS — N6081 Other benign mammary dysplasias of right breast: Secondary | ICD-10-CM

## 2019-08-16 DIAGNOSIS — G547 Phantom limb syndrome without pain: Secondary | ICD-10-CM | POA: Diagnosis not present

## 2019-08-16 DIAGNOSIS — IMO0002 Reserved for concepts with insufficient information to code with codable children: Secondary | ICD-10-CM

## 2019-08-16 DIAGNOSIS — I10 Essential (primary) hypertension: Secondary | ICD-10-CM

## 2019-08-16 LAB — POCT GLYCOSYLATED HEMOGLOBIN (HGB A1C): HbA1c, POC (prediabetic range): 6.5 % — AB (ref 5.7–6.4)

## 2019-08-16 MED ORDER — CLOPIDOGREL BISULFATE 75 MG PO TABS: 75.0000 mg | ORAL_TABLET | Freq: Every day | ORAL | 1 refills | Status: DC

## 2019-08-16 MED ORDER — VITAMIN D (ERGOCALCIFEROL) 1.25 MG (50000 UNIT) PO CAPS: 50000.0000 [IU] | ORAL_CAPSULE | ORAL | 1 refills | Status: DC

## 2019-08-16 MED ORDER — CITALOPRAM HYDROBROMIDE 20 MG PO TABS: 20.0000 mg | ORAL_TABLET | Freq: Every day | ORAL | 1 refills | Status: DC

## 2019-08-16 MED ORDER — ROSUVASTATIN CALCIUM 10 MG PO TABS: 10.0000 mg | ORAL_TABLET | Freq: Every day | ORAL | 1 refills | Status: DC

## 2019-08-16 MED ORDER — METFORMIN HCL ER 500 MG PO TB24: 1000.0000 mg | ORAL_TABLET | Freq: Every day | ORAL | 1 refills | Status: DC

## 2019-08-16 MED ORDER — LOSARTAN POTASSIUM 100 MG PO TABS: 100.0000 mg | ORAL_TABLET | Freq: Every day | ORAL | 1 refills | Status: DC

## 2019-08-16 MED ORDER — B-12 1000 MCG SL SUBL: 1.0000 | SUBLINGUAL_TABLET | Freq: Every day | SUBLINGUAL | 5 refills | Status: DC
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
## 2019-08-16 NOTE — Progress Notes (Signed)
.  pov

## 2019-09-09 ENCOUNTER — Telehealth: Payer: Self-pay | Admitting: Family Medicine

## 2019-09-09 NOTE — Telephone Encounter (Signed)
Left message for patient to schedule Annual Wellness Visit.  Please schedule with Nurse Health Advisor Victoria Britt, RN at [facility]  

## 2019-09-20 ENCOUNTER — Telehealth: Payer: Self-pay | Admitting: Family Medicine

## 2019-09-20 NOTE — Chronic Care Management (AMB) (Signed)
  Chronic Care Management   Outreach Note  09/20/2019 Name: Lisa [name] MRN: [id number] DOB: May 06, 1946  Lisa [name] is a 74 y.o. year old female who is a primary care patient of Alba Cory, MD. I reached out to [name] by phone today in response to a referral sent by Ms. Mayanna Say's health plan.     An unsuccessful telephone outreach was attempted today. The patient was referred to the case management team for assistance with care management and care coordination.   Follow Up Plan: A HIPPA compliant phone message was left for the patient providing contact information and requesting a return call.  The care management team will reach out to the patient again over the next 7 days.  If patient returns call to provider office, please advise to call Embedded Care Management Care Guide Penne Lash  at [phone]  Penne Lash, RMA Care Guide, Embedded Care Coordination [facility]  [place], [place] [address] Direct Dial: [phone] [email or web address]: [email or web address]

## 2019-09-23 NOTE — Chronic Care Management (AMB) (Signed)
  Chronic Care Management   Outreach Note  09/23/2019 Name: Lisa [name] MRN: [id number] DOB: 06-29-1945  Lisa [name] is a 74 y.o. year old female who is a primary care patient of Alba Cory, MD. I reached out to [name] by phone today in response to a referral sent by Ms. Adelisa Blethen's health plan.     A second unsuccessful telephone outreach was attempted today. The patient was referred to the case management team for assistance with care management and care coordination.   Follow Up Plan: A HIPPA compliant phone message was left for the patient providing contact information and requesting a return call.  The care management team will reach out to the patient again over the next 7 days.  If patient returns call to provider office, please advise to call Embedded Care Management Care Guide Penne Lash at [phone]  Penne Lash, RMA Care Guide, Embedded Care Coordination [facility]  [place], [place] [address] Direct Dial: [phone] [email or web address] Website: [email or web address]

## 2019-09-27 ENCOUNTER — Ambulatory Visit (INDEPENDENT_AMBULATORY_CARE_PROVIDER_SITE_OTHER): Payer: Medicare Other | Admitting: Vascular Surgery

## 2019-09-27 ENCOUNTER — Encounter (INDEPENDENT_AMBULATORY_CARE_PROVIDER_SITE_OTHER): Payer: Medicare Other

## 2019-09-27 NOTE — Chronic Care Management (AMB) (Signed)
  Chronic Care Management   Note  09/27/2019 Name: Lisa [name] MRN: [id number] DOB: May 18, 1946  [name] is a 74 y.o. year old female who is a primary care patient of Steele Sizer, MD. I reached out to Geoffery Lyons by phone today in response to a referral sent by [name]'s health plan.     [name] was given information about Chronic Care Management services today including:  1. CCM service includes personalized support from designated clinical staff supervised by her physician, including individualized plan of care and coordination with other care providers 2. 24/7 contact phone numbers for assistance for urgent and routine care needs. 3. Service will only be billed when office clinical staff spend 20 minutes or more in a month to coordinate care. 4. Only one practitioner may furnish and bill the service in a calendar month. 5. The patient may stop CCM services at any time (effective at the end of the month) by phone call to the office staff. 6. The patient will be responsible for cost sharing (co-pay) of up to 20% of the service fee (after annual deductible is met).  Patient did not agree to enrollment in care management services and does not wish to consider at this time.  Follow up plan: The patient has been provided with contact information for the care management team and has been advised to call with any health related questions or concerns.   Noreene Larsson, [address], [place], [place] [address] Direct Dial: [phone] [email or web address] Website: [email or web address]

## 2019-09-30 ENCOUNTER — Telehealth: Payer: Self-pay | Admitting: Family Medicine

## 2019-09-30 NOTE — Telephone Encounter (Signed)
Left message for patient to schedule Annual Wellness Visit.  Please schedule with Nurse Health Advisor KASEY UTHUS, RN.   

## 2019-10-04 ENCOUNTER — Telehealth: Payer: Self-pay | Admitting: Family Medicine

## 2019-10-04 NOTE — Telephone Encounter (Signed)
Left message for patient to schedule Annual Wellness Visit.  Please schedule with Nurse Health Advisor KASEY UTHUS, RN.   

## 2019-10-16 ENCOUNTER — Other Ambulatory Visit: Payer: Self-pay | Admitting: Family Medicine

## 2019-10-16 DIAGNOSIS — E1121 Type 2 diabetes mellitus with diabetic nephropathy: Secondary | ICD-10-CM

## 2019-10-25 ENCOUNTER — Telehealth: Payer: Self-pay | Admitting: Family Medicine

## 2019-10-25 NOTE — Telephone Encounter (Signed)
Left message for patient to schedule Annual Wellness Visit.  Please schedule with Nurse Health Advisor KASEY UTHUS, RN at [facility].   ° °

## 2019-11-16 ENCOUNTER — Telehealth: Payer: Self-pay | Admitting: Family Medicine

## 2019-11-16 NOTE — Telephone Encounter (Signed)
Copied from CRM [id number]. Topic: Medicare AWV >> Nov 16, 2019 10:41 AM Claudette Laws R wrote: Reason for CRM: Called patient to schedule Medicare Annual Wellness Visit with Nurse Health Advisor.   If patient returns call, please schedule for any date.  Last AWV completed: 07/20/2018  Appointment length should be 40 minutes  Thank you,  Judeth Cornfield [phone]

## 2019-11-18 ENCOUNTER — Encounter (INDEPENDENT_AMBULATORY_CARE_PROVIDER_SITE_OTHER): Payer: Self-pay | Admitting: Vascular Surgery

## 2019-11-18 ENCOUNTER — Other Ambulatory Visit: Payer: Self-pay

## 2019-11-18 ENCOUNTER — Ambulatory Visit (INDEPENDENT_AMBULATORY_CARE_PROVIDER_SITE_OTHER): Payer: Medicare Other | Admitting: Vascular Surgery

## 2019-11-18 ENCOUNTER — Ambulatory Visit (INDEPENDENT_AMBULATORY_CARE_PROVIDER_SITE_OTHER): Payer: Medicare Other

## 2019-11-18 VITALS — BP 153/74 | HR 74 | Resp 16 | Wt 144.6 lb

## 2019-11-18 DIAGNOSIS — I739 Peripheral vascular disease, unspecified: Secondary | ICD-10-CM

## 2019-11-18 DIAGNOSIS — E1151 Type 2 diabetes mellitus with diabetic peripheral angiopathy without gangrene: Secondary | ICD-10-CM | POA: Diagnosis not present

## 2019-11-18 DIAGNOSIS — E785 Hyperlipidemia, unspecified: Secondary | ICD-10-CM | POA: Diagnosis not present

## 2019-11-18 DIAGNOSIS — I10 Essential (primary) hypertension: Secondary | ICD-10-CM | POA: Diagnosis not present

## 2019-11-18 NOTE — Progress Notes (Signed)
MRN : [id number]  Lisa [name] is a 74 y.o. (March 09, 1946) female who presents with chief complaint of  Chief Complaint  Patient presents with  . Follow-up    ultrasound follow up  .  History of Present Illness: Patient returns today in follow up of her PAD.  She still has a lot of neuropathic pain in her feet and ankles.  No new ulceration or infection.  Her ABIs today are stable at 0.7 on the right and 0.82 on the left with decent monophasic waveforms.  She is status post left transmetatarsal amputation many years ago.  Her right digit pressures are 70.  Current Outpatient Medications  Medication Sig Dispense Refill  . aspirin 81 MG tablet Take 81 mg by mouth daily.    Marland Kitchen atropine 1 % ophthalmic solution Place 1 drop into the right eye 3 (three) times daily.    . blood glucose meter kit and supplies     . Blood Glucose Monitoring Suppl (ACCU-CHEK AVIVA CONNECT) w/Device KIT 1 each by Does not apply route daily. And supplies E11.9 check fsbs bid 1 kit 0  . brimonidine (ALPHAGAN) 0.2 % ophthalmic solution USE 1 DROP IN BOTH EYE TWICE A DAY  3  . citalopram (CELEXA) 20 MG tablet Take 1 tablet (20 mg total) by mouth daily. 90 tablet 1  . clopidogrel (PLAVIX) 75 MG tablet Take 1 tablet (75 mg total) by mouth daily. 90 tablet 1  . Cyanocobalamin (B-12) 1000 MCG SUBL Place 1 tablet under the tongue daily. 30 tablet 5  . dorzolamide-timolol (COSOPT) 22.3-6.8 MG/ML ophthalmic solution USE 1 DROP IN BOTH EYES 2 TIMES A DAY  4  . losartan (COZAAR) 100 MG tablet Take 1 tablet (100 mg total) by mouth daily. 90 tablet 1  . metaxalone (SKELAXIN) 800 MG tablet Take by mouth.    . metFORMIN (GLUCOPHAGE-XR) 500 MG 24 hr tablet Take 2 tablets (1,000 mg total) by mouth daily with breakfast. 180 tablet 1  . Methylcobalamin (B12-ACTIVE) 1 MG CHEW Chew 2 tablets by mouth daily.    . Multiple Vitamin (MULTIVITAMIN) tablet Take 1 tablet by mouth daily.    . prednisoLONE acetate (PRED FORTE) 1 % ophthalmic  suspension USE 1 DROP IN RIGHT EYE 4 TIMES A DAY  3  . rosuvastatin (CRESTOR) 10 MG tablet Take 1 tablet (10 mg total) by mouth daily. 90 tablet 1  . timolol (BETIMOL) 0.5 % ophthalmic solution 1 drop.    . timolol (TIMOPTIC) 0.5 % ophthalmic solution 1 drop.    . Vitamin D, Ergocalciferol, (DRISDOL) 1.25 MG (50000 UNIT) CAPS capsule Take 1 capsule (50,000 Units total) by mouth every 7 (seven) days. 8 capsule 1   No current facility-administered medications for this visit.    Past Medical History:  Diagnosis Date  . Blindness of right eye   . Depression   . Diabetes mellitus without complication ([place])   . DVT of leg (deep venous thrombosis) ([place])   . Glaucoma   . Hyperlipidemia   . Hypertension   . Vascular disease     Past Surgical History:  Procedure Laterality Date  . ABDOMINAL HYSTERECTOMY  1992   Total  . EYE SURGERY    . TOE AMPUTATION Left 2011   All of her toes on the left foot  . VASCULAR SURGERY       Social History   Tobacco Use  . Smoking status: Current Every Day Smoker    Packs/day: 0.25    Years:  40.00    Pack years: 10.00    Types: Cigarettes    Start date: 03/16/2000  . Smokeless tobacco: Never Used  Vaping Use  . Vaping Use: Never used  Substance Use Topics  . Alcohol use: No    Alcohol/week: 0.0 standard drinks  . Drug use: No      Family History  Problem Relation Age of Onset  . Heart disease Father   . Diabetes Mother   . Diabetes Brother   . Kidney disease Brother        Transplant  . Heart disease Brother   . Diabetes Sister   . Diabetes Sister      Allergies  Allergen Reactions  . Other Itching    Nucinta  . Tapentadol Hcl      REVIEW OF SYSTEMS (Negative unless checked)  Constitutional: '[]'$ Weight loss  '[]'$ Fever  '[]'$ Chills Cardiac: '[]'$ Chest pain   '[]'$ Chest pressure   '[]'$ Palpitations   '[]'$ Shortness of breath when laying flat   '[]'$ Shortness of breath at rest   '[]'$ Shortness of breath with exertion. Vascular:  '[x]'$ Pain in legs  with walking   '[x]'$ Pain in legs at rest   '[]'$ Pain in legs when laying flat   '[]'$ Claudication   '[]'$ Pain in feet when walking  '[]'$ Pain in feet at rest  '[]'$ Pain in feet when laying flat   '[]'$ History of DVT   '[]'$ Phlebitis   '[]'$ Swelling in legs   '[]'$ Varicose veins   '[]'$ Non-healing ulcers Pulmonary:   '[]'$ Uses home oxygen   '[]'$ Productive cough   '[]'$ Hemoptysis   '[]'$ Wheeze  '[]'$ COPD   '[]'$ Asthma Neurologic:  '[]'$ Dizziness  '[]'$ Blackouts   '[]'$ Seizures   '[]'$ History of stroke   '[]'$ History of TIA  '[]'$ Aphasia   '[]'$ Temporary blindness   '[]'$ Dysphagia   '[]'$ Weakness or numbness in arms   '[]'$ Weakness or numbness in legs Musculoskeletal:  '[x]'$ Arthritis   '[]'$ Joint swelling   '[x]'$ Joint pain   '[]'$ Low back pain Hematologic:  '[]'$ Easy bruising  '[]'$ Easy bleeding   '[]'$ Hypercoagulable state   '[]'$ Anemic   Gastrointestinal:  '[]'$ Blood in stool   '[]'$ Vomiting blood  '[]'$ Gastroesophageal reflux/heartburn   '[]'$ Abdominal pain Genitourinary:  '[]'$ Chronic kidney disease   '[]'$ Difficult urination  '[]'$ Frequent urination  '[]'$ Burning with urination   '[]'$ Hematuria Skin:  '[]'$ Rashes   '[]'$ Ulcers   '[]'$ Wounds Psychological:  '[]'$ History of anxiety   '[]'$  History of major depression.  Physical Examination  BP (!) 153/74 (BP Location: Right Arm)   Pulse 74   Resp 16   Wt 144 lb 9.6 oz (65.6 kg)   BMI 24.06 kg/m  Gen:  WD/WN, NAD Head: Strong/AT, No temporalis wasting. Ear/Nose/Throat: Hearing grossly intact, nares w/o erythema or drainage Eyes: Conjunctiva clear. Sclera non-icteric Neck: Supple.  Trachea midline Pulmonary:  Good air movement, no use of accessory muscles.  Cardiac: RRR, no JVD Vascular:  Vessel Right Left  Radial Palpable Palpable                          PT Trace Palpable 1+ Palpable  DP 1+ Palpable not Palpable   Gastrointestinal: soft, non-tender/non-distended. No guarding/reflex.  Musculoskeletal: M/S 5/5 throughout.  No deformity or atrophy. Left TMA well healed. trace edema. Neurologic: Sensation grossly intact in extremities.  Symmetrical.  Speech is fluent.    Psychiatric: Judgment intact, Mood & affect appropriate for pt's clinical situation. Dermatologic: No rashes or ulcers noted.  No cellulitis or open wounds.       Labs No results found for this or any previous visit (from  the past 2160 hour(s)).  Radiology No results found.  Assessment/Plan Benign essential HTN blood pressure control important in reducing the progression of atherosclerotic disease. On appropriate oral medications.   DM (diabetes mellitus), type 2 with peripheral vascular complications (HCC) blood glucose control important in reducing the progression of atherosclerotic disease. Also, involved in wound healing. On appropriate medications.   Dyslipidemia lipid control important in reducing the progression of atherosclerotic disease. Continue statin therapy  Peripheral artery disease Her ABIs today are stable at 0.7 on the right and 0.82 on the left with decent monophasic waveforms.  She is status post left transmetatarsal amputation many years ago.  Her right digit pressures are 70. Doing well.  No changes in medical regimen.  Recheck in 6 months.    Leotis Pain, MD  11/18/2019 12:27 PM    This note was created with Dragon medical transcription system.  Any errors from dictation are purely unintentional

## 2019-11-18 NOTE — Assessment & Plan Note (Signed)
Her ABIs today are stable at 0.7 on the right and 0.82 on the left with decent monophasic waveforms.  She is status post left transmetatarsal amputation many years ago.  Her right digit pressures are 70. Doing well.  No changes in medical regimen.  Recheck in 6 months.

## 2019-11-18 NOTE — Patient Instructions (Signed)
Peripheral Vascular Disease  Peripheral vascular disease (PVD) is a disease of the blood vessels that are not part of your heart and brain. A simple term for PVD is poor circulation. In most cases, PVD narrows the blood vessels that carry blood from your heart to the rest of your body. This can reduce the supply of blood to your arms, legs, and internal organs, like your stomach or kidneys. However, PVD most often affects a person's lower legs and feet. Without treatment, PVD tends to get worse. PVD can also lead to acute ischemic limb. This is when an arm or leg suddenly cannot get enough blood. This is a medical emergency. Follow these instructions at home: Lifestyle  Do not use any products that contain nicotine or tobacco, such as cigarettes and e-cigarettes. If you need help quitting, ask your doctor.  Lose weight if you are overweight. Or, stay at a healthy weight as told by your doctor.  Eat a diet that is low in fat and cholesterol. If you need help, ask your doctor.  Exercise regularly. Ask your doctor for activities that are right for you. General instructions  Take over-the-counter and prescription medicines only as told by your doctor.  Take good care of your feet: ? Wear comfortable shoes that fit well. ? Check your feet often for any cuts or sores.  Keep all follow-up visits as told by your doctor This is important. Contact a doctor if:  You have cramps in your legs when you walk.  You have leg pain when you are at rest.  You have coldness in a leg or foot.  Your skin changes.  You are unable to get or have an erection (erectile dysfunction).  You have cuts or sores on your feet that do not heal. Get help right away if:  Your arm or leg turns cold, numb, and blue.  Your arms or legs become red, warm, swollen, painful, or numb.  You have chest pain.  You have trouble breathing.  You suddenly have weakness in your face, arm, or leg.  You become very  confused or you cannot speak.  You suddenly have a very bad headache.  You suddenly cannot see. Summary  Peripheral vascular disease (PVD) is a disease of the blood vessels.  A simple term for PVD is poor circulation. Without treatment, PVD tends to get worse.  Treatment may include exercise, low fat and low cholesterol diet, and quitting smoking. This information is not intended to replace advice given to you by your health care provider. Make sure you discuss any questions you have with your health care provider. Document Revised: 04/24/2017 Document Reviewed: 06/19/2016 Elsevier Patient Education  2020 [employer]vier Inc.  

## 2019-11-21 ENCOUNTER — Telehealth: Payer: Self-pay | Admitting: Family Medicine

## 2019-11-21 NOTE — Telephone Encounter (Signed)
Copied from CRM [id number]. Topic: Medicare AWV >> Nov 21, 2019  2:54 PM Claudette Laws R wrote: Reason for CRM: Left message for patient to call back and schedule the Medicare Annual Wellness Visit (AWV) virtually.  Last AWV 07/20/2018  Please schedule at anytime with [facility] Health Advisor.  40 minute appointment

## 2019-11-29 ENCOUNTER — Telehealth: Payer: Self-pay

## 2019-11-29 NOTE — Telephone Encounter (Signed)
Copied from CRM [id number]. Topic: Appointment Scheduling - Scheduling Inquiry for Clinic >> Nov 29, 2019  1:27 PM Lisa [name] wrote: Reason for CRM: pt would like appt asap. She states it is rather personal.she is aware Dr Carlynn Purl is out of office. Pt prefers female if possible

## 2019-11-30 ENCOUNTER — Ambulatory Visit
Admission: EM | Admit: 2019-11-30 | Discharge: 2019-11-30 | Disposition: A | Discharge status: Home / Self Care | Payer: Medicare Other | Attending: Emergency Medicine | Admitting: Emergency Medicine

## 2019-11-30 ENCOUNTER — Emergency Department
Admission: EM | Admit: 2019-11-30 | Discharge: 2019-11-30 | Disposition: A | Discharge status: Home / Self Care | Payer: Medicare Other | Attending: Emergency Medicine | Admitting: Emergency Medicine

## 2019-11-30 ENCOUNTER — Ambulatory Visit (INDEPENDENT_AMBULATORY_CARE_PROVIDER_SITE_OTHER): Payer: Medicare Other | Admitting: Family Medicine

## 2019-11-30 ENCOUNTER — Other Ambulatory Visit: Payer: Self-pay

## 2019-11-30 ENCOUNTER — Encounter: Payer: Self-pay | Admitting: Family Medicine

## 2019-11-30 ENCOUNTER — Encounter: Payer: Self-pay | Admitting: *Deleted

## 2019-11-30 VITALS — BP 256/140 | HR 97 | Ht 64.0 in | Wt 144.0 lb

## 2019-11-30 DIAGNOSIS — F330 Major depressive disorder, recurrent, mild: Secondary | ICD-10-CM

## 2019-11-30 DIAGNOSIS — F17210 Nicotine dependence, cigarettes, uncomplicated: Secondary | ICD-10-CM | POA: Diagnosis not present

## 2019-11-30 DIAGNOSIS — Z7982 Long term (current) use of aspirin: Secondary | ICD-10-CM | POA: Insufficient documentation

## 2019-11-30 DIAGNOSIS — Z7984 Long term (current) use of oral hypoglycemic drugs: Secondary | ICD-10-CM | POA: Diagnosis not present

## 2019-11-30 DIAGNOSIS — I10 Essential (primary) hypertension: Secondary | ICD-10-CM | POA: Diagnosis not present

## 2019-11-30 DIAGNOSIS — E1121 Type 2 diabetes mellitus with diabetic nephropathy: Secondary | ICD-10-CM | POA: Insufficient documentation

## 2019-11-30 DIAGNOSIS — R9431 Abnormal electrocardiogram [ECG] [EKG]: Secondary | ICD-10-CM | POA: Insufficient documentation

## 2019-11-30 DIAGNOSIS — R42 Dizziness and giddiness: Secondary | ICD-10-CM | POA: Diagnosis present

## 2019-11-30 DIAGNOSIS — E1140 Type 2 diabetes mellitus with diabetic neuropathy, unspecified: Secondary | ICD-10-CM | POA: Insufficient documentation

## 2019-11-30 DIAGNOSIS — I160 Hypertensive urgency: Secondary | ICD-10-CM | POA: Diagnosis not present

## 2019-11-30 DIAGNOSIS — Z79899 Other long term (current) drug therapy: Secondary | ICD-10-CM | POA: Diagnosis not present

## 2019-11-30 DIAGNOSIS — R5383 Other fatigue: Secondary | ICD-10-CM

## 2019-11-30 LAB — BASIC METABOLIC PANEL
Anion gap: 9 (ref 5–15)
BUN: 9 mg/dL (ref 8–23)
CO2: 26 mmol/L (ref 22–32)
Calcium: 8.9 mg/dL (ref 8.9–10.3)
Chloride: 106 mmol/L (ref 98–111)
Creatinine, Ser: 0.82 mg/dL (ref 0.44–1.00)
GFR calc Af Amer: >60 mL/min (ref >60)
GFR calc non Af Amer: >60 mL/min (ref >60)
Glucose, Bld: 180 mg/dL — ABNORMAL HIGH (ref 70–99)
Potassium: 3.5 mmol/L (ref 3.5–5.1)
Sodium: 141 mmol/L (ref 135–145)

## 2019-11-30 LAB — CBC WITH DIFFERENTIAL/PLATELET
Abs Immature Granulocytes: 0.02 10*3/uL (ref 0.00–0.07)
Basophils Absolute: 0 10*3/uL (ref 0.0–0.1)
Basophils Relative: 1 %
Eosinophils Absolute: 0 10*3/uL (ref 0.0–0.5)
Eosinophils Relative: 0 %
HCT: 36.1 % (ref 36.0–46.0)
Hemoglobin: 11.8 g/dL — ABNORMAL LOW (ref 12.0–15.0)
Immature Granulocytes: 0 %
Lymphocytes Relative: 22 %
Lymphs Abs: 1.2 10*3/uL (ref 0.7–4.0)
MCH: 26 pg (ref 26.0–34.0)
MCHC: 32.7 g/dL (ref 30.0–36.0)
MCV: 79.5 fL — ABNORMAL LOW (ref 80.0–100.0)
Monocytes Absolute: 0.4 10*3/uL (ref 0.1–1.0)
Monocytes Relative: 8 %
Neutro Abs: 3.7 10*3/uL (ref 1.7–7.7)
Neutrophils Relative %: 69 %
Platelets: 202 10*3/uL (ref 150–400)
RBC: 4.54 MIL/uL (ref 3.87–5.11)
RDW: 17.6 % — ABNORMAL HIGH (ref 11.5–15.5)
WBC: 5.3 10*3/uL (ref 4.0–10.5)
nRBC: 0 % (ref 0.0–0.2)

## 2019-11-30 NOTE — ED Triage Notes (Signed)
Patient was seen by her PCP's office today. Reports her fingerstick blood sugar at home was 190 and her BP at home was 203/119.   Denies headache, dizziness, or lightheadedness.

## 2019-11-30 NOTE — ED Notes (Signed)
Pt states her PCP is on vacation and was concerned about her b/p and went to urgent care where it was elevated and referred to the ED. Pt denies any pain or other sx. Pt does states she would like her celexa dose increased, denies SI,.

## 2019-11-30 NOTE — ED Notes (Signed)
Patient is being discharged from the Urgent Care and sent to the Emergency Department via POV . Per Wendee Beavers, NP, patient is in need of higher level of care due to HTN and EKG changes. Patient is aware and verbalizes understanding of plan of care.  Vitals:   11/30/19 1436  BP: (!) 203/95  Pulse: 89  Resp: 16  Temp: 98.4 F (36.9 C)  SpO2: 97%

## 2019-11-30 NOTE — ED Triage Notes (Signed)
Patient was sent from [facility] Urgent care for hypertension and EKG changes.  Patient states blood pressure was 203/119.

## 2019-11-30 NOTE — ED Provider Notes (Signed)
[facility] Emergency Department Provider Note   ____________________________________________    I have reviewed the triage vital signs and the nursing notes.   HISTORY  Chief Complaint Hypertension     HPI Lisa [name] is a 74 y.o. female with a history of diabetes, hypertension, peripheral vascular disease presents today with complaints of high blood pressure.  Patient reports her blood pressure was elevated this morning and over 808 systolic.  She wanted to see her PCP who is out of town, and said she went to urgent care.  She was referred to the emergency department for evaluation from there because of elevated blood pressure.  Patient does admit to me that she is only been taking half of her losartan.  She is supposed to take 100 mg, she has been taking 50 mg.  She also only intermittently takes her Metformin.  She has no chest pain, no palpitations, no shortness of breath.  Past Medical History:  Diagnosis Date  . Blindness of right eye   . Depression   . Diabetes mellitus without complication ([place])   . DVT of leg (deep venous thrombosis) ([place])   . Glaucoma   . Hyperlipidemia   . Hypertension   . Vascular disease     Patient Active Problem List   Diagnosis Date Noted  . Uncontrolled type 2 diabetes with eye complications ([place]) 81/02/3158  . DM (diabetes mellitus), type 2 with peripheral vascular complications ([place]) 45/85/9292  . Amputated toe ([place]) 12/02/2014  . Benign essential HTN 12/02/2014  . Depression, major, recurrent, mild ([place]) 12/02/2014  . Dyslipidemia 12/02/2014  . Vitamin B12 deficiency 12/02/2014  . Peripheral neuropathic pain 12/02/2014  . Peripheral artery disease ([place]) 12/02/2014  . Phantom limb ([place]) 12/02/2014  . Diabetes mellitus with nephropathy ([place]) 12/02/2014  . Detached retina 12/02/2014  . Type 2 diabetes mellitus with peripheral neuropathy ([place]) 12/02/2014  . Tobacco use 12/02/2014  . Urge incontinence  12/02/2014  . 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  . Vitamin D deficiency 12/02/2014    Past Surgical History:  Procedure Laterality Date  . ABDOMINAL HYSTERECTOMY  1992   Total  . EYE SURGERY    . TOE AMPUTATION Left 2011   All of her toes on the left foot  . VASCULAR SURGERY      Prior to Admission medications   Medication Sig Start Date End Date Taking? Authorizing Provider  aspirin 81 MG tablet Take 81 mg by mouth daily.    [provider]  atropine 1 % ophthalmic solution Place 1 drop into the right eye 3 (three) times daily.    [provider]  blood glucose meter kit and supplies  09/01/13   [provider]  Blood Glucose Monitoring Suppl (ACCU-CHEK AVIVA CONNECT) w/Device KIT 1 each by Does not apply route daily. And supplies E11.9 check fsbs bid 07/20/18   Steele Sizer, MD  brimonidine (ALPHAGAN) 0.2 % ophthalmic solution USE 1 DROP IN BOTH EYE TWICE A DAY 09/30/16   [provider]  citalopram (CELEXA) 20 MG tablet Take 1 tablet (20 mg total) by mouth daily. 08/16/19   Steele Sizer, MD  clopidogrel (PLAVIX) 75 MG tablet Take 1 tablet (75 mg total) by mouth daily. 08/16/19   Steele Sizer, MD  Cyanocobalamin (B-12) 1000 MCG SUBL Place 1 tablet under the tongue daily. 08/16/19   Steele Sizer, MD  dorzolamide-timolol (COSOPT) 22.3-6.8 MG/ML ophthalmic solution USE 1 DROP IN BOTH EYES 2 TIMES A DAY 09/22/16  [provider]  losartan (COZAAR) 100 MG tablet Take 1 tablet (100 mg total) by mouth daily. Patient taking differently: Take 50 mg by mouth daily.  08/16/19   Steele Sizer, MD  metaxalone [facility]) 800 MG tablet Take by mouth. 12/15/18   [provider]  metFORMIN (GLUCOPHAGE-XR) 500 MG 24 hr tablet Take 2 tablets (1,000 mg total) by mouth daily with breakfast. Patient not taking: Reported on 11/30/2019 08/16/19   Steele Sizer, MD  Methylcobalamin (B12-ACTIVE) 1 MG CHEW Chew 2 tablets by mouth daily. Patient not taking:  Reported on 11/30/2019    [provider]  Multiple Vitamin (MULTIVITAMIN) tablet Take 1 tablet by mouth daily.    [provider]  prednisoLONE acetate (PRED FORTE) 1 % ophthalmic suspension USE 1 DROP IN RIGHT EYE 4 TIMES A DAY 07/27/15   [provider]  rosuvastatin (CRESTOR) 10 MG tablet Take 1 tablet (10 mg total) by mouth daily. 08/16/19   Steele Sizer, MD  timolol (BETIMOL) 0.5 % ophthalmic solution 1 drop. 01/06/07   [provider]  timolol (TIMOPTIC) 0.5 % ophthalmic solution 1 drop. Patient not taking: Reported on 11/30/2019 01/06/07   [provider]  Vitamin D, Ergocalciferol, (DRISDOL) 1.25 MG (50000 UNIT) CAPS capsule Take 1 capsule (50,000 Units total) by mouth every 7 (seven) days. 08/16/19   Steele Sizer, MD     Allergies Other and Tapentadol hcl  Family History  Problem Relation Age of Onset  . Heart disease Father   . Diabetes Mother   . Diabetes Brother   . Kidney disease Brother        Transplant  . Heart disease Brother   . Diabetes Sister   . Diabetes Sister     Social History Social History   Tobacco Use  . Smoking status: Current Every Day Smoker    Packs/day: 0.25    Years: 40.00    Pack years: 10.00    Types: Cigarettes    Start date: 03/16/2000  . Smokeless tobacco: Never Used  Vaping Use  . Vaping Use: Never used  Substance Use Topics  . Alcohol use: No    Alcohol/week: 0.0 standard drinks  . Drug use: No    Review of Systems  Constitutional: No fever/chills  Cardiovascular: Denies chest pain. Respiratory: Denies shortness of breath. Gastrointestinal: No abdominal pain.   Neurological: Negative for headaches    ____________________________________________   PHYSICAL EXAM:  VITAL SIGNS: ED Triage Vitals  Enc Vitals Group     BP 11/30/19 1529 (!) 168/121     Pulse Rate 11/30/19 1529 99     Resp 11/30/19 1529 16     Temp 11/30/19 1529 99.4 F (37.4 C)     Temp Source 11/30/19 1529  Oral     SpO2 11/30/19 1529 94 %     Weight 11/30/19 1530 65.3 kg (144 lb)     Height 11/30/19 1530 1.638 m (5' 4.5")     Head Circumference --      Peak Flow --      Pain Score 11/30/19 1530 8     Pain Loc --      Pain Edu? --      Excl. in [place]? --     Constitutional: Alert and oriented. No acute distress. Pleasant and interactive  Nose: No congestion/rhinnorhea. Mouth/Throat: Mucous membranes are moist.    Cardiovascular: Normal rate, regular rhythm. Grossly normal heart sounds.  Good peripheral circulation. Respiratory: Normal respiratory effort.  No retractions. Lungs  CTAB.  Musculoskeletal:   Warm and well perfused Neurologic:  Normal speech and language. No gross focal neurologic deficits are appreciated.  Skin:  Skin is warm, dry and intact. No rash noted. Psychiatric: Mood and affect are normal. Speech and behavior are normal.  ____________________________________________   LABS (all labs ordered are listed, but only abnormal results are displayed)  Labs Reviewed  CBC WITH DIFFERENTIAL/PLATELET - Abnormal; Notable for the following components:      Result Value   Hemoglobin 11.8 (*)    MCV 79.5 (*)    RDW 17.6 (*)    All other components within normal limits  BASIC METABOLIC PANEL - Abnormal; Notable for the following components:   Glucose, Bld 180 (*)    All other components within normal limits   ____________________________________________  EKG   ____________________________________________  RADIOLOGY  None ____________________________________________   PROCEDURES  Procedure(s) performed: No  Procedures   Critical Care performed: No ____________________________________________   INITIAL IMPRESSION / ASSESSMENT AND PLAN / ED COURSE  Pertinent labs & imaging results that were available during my care of the patient were reviewed by me and considered in my medical decision making (see chart for details).  Patient well-appearing and  asymptomatic in the emergency department.  Blood pressure has improved here to 416 systolic without intervention.  Suspect her elevated blood pressure is related to medication noncompliance.  Recommend taking 100 mg of losartan as she is prescribed.  No chest pain shortness of breath or other symptoms.  Lab work today is unremarkable kidney function is normal.  Outpatient follow-up with PCP for recheck of blood pressure.    ____________________________________________   FINAL CLINICAL IMPRESSION(S) / ED DIAGNOSES  Final diagnoses:  Essential hypertension        Note:  This document was prepared using Dragon voice recognition software and may include unintentional dictation errors.   Lavonia Drafts, MD 11/30/19 2003

## 2019-11-30 NOTE — Discharge Instructions (Addendum)
Please take your prescribed dose of losartan and continue to monitor your blood pressure

## 2019-11-30 NOTE — ED Provider Notes (Signed)
Roderic Palau    CSN: [id number] Arrival date & time: 11/30/19  1431      History   Chief Complaint Chief Complaint  Patient presents with  . Hypertension    HPI Lisa [name] is a 74 y.o. female.   Patient presents with high blood pressure. Her blood pressure was 203/119 at home this morning.  She also reports fatigue and light-headedness.  She had a video visit with her PCP and was instructed to come here for evaluation.  She denies headache, dizziness, chest pain, shortness of breath, edema, focal weakness, numbness, or other symptoms.  She is not taking her blood pressure medicine as prescribed.  The history is provided by the patient.    Past Medical History:  Diagnosis Date  . Blindness of right eye   . Depression   . Diabetes mellitus without complication ([place])   . DVT of leg (deep venous thrombosis) ([place])   . Glaucoma   . Hyperlipidemia   . Hypertension   . Vascular disease     Patient Active Problem List   Diagnosis Date Noted  . Uncontrolled type 2 diabetes with eye complications ([place]) 53/29/9242  . DM (diabetes mellitus), type 2 with peripheral vascular complications ([place]) 68/34/1962  . Amputated toe ([place]) 12/02/2014  . Benign essential HTN 12/02/2014  . Depression, major, recurrent, mild ([place]) 12/02/2014  . Dyslipidemia 12/02/2014  . Vitamin B12 deficiency 12/02/2014  . Peripheral neuropathic pain 12/02/2014  . Peripheral artery disease ([place]) 12/02/2014  . Phantom limb ([place]) 12/02/2014  . Diabetes mellitus with nephropathy ([place]) 12/02/2014  . Detached retina 12/02/2014  . Type 2 diabetes mellitus with peripheral neuropathy ([place]) 12/02/2014  . Tobacco use 12/02/2014  . Urge incontinence 12/02/2014  . 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  . Vitamin D deficiency 12/02/2014    Past Surgical History:  Procedure Laterality Date  . ABDOMINAL HYSTERECTOMY  1992   Total  . EYE SURGERY    . TOE AMPUTATION Left 2011   All of her toes on the left foot  .  VASCULAR SURGERY      OB History    Gravida  0   Para  0   Term  0   Preterm  0   AB  0   Living  0     SAB  0   TAB  0   Ectopic  0   Multiple  0   Live Births           Obstetric Comments  1st Menstrual Cycle:  18 1st Pregnancy:  na          Home Medications    Prior to Admission medications   Medication Sig Start Date End Date Taking? Authorizing Provider  aspirin 81 MG tablet Take 81 mg by mouth daily.    [provider]  atropine 1 % ophthalmic solution Place 1 drop into the right eye 3 (three) times daily.    [provider]  blood glucose meter kit and supplies  09/01/13   [provider]  Blood Glucose Monitoring Suppl (ACCU-CHEK AVIVA CONNECT) w/Device KIT 1 each by Does not apply route daily. And supplies E11.9 check fsbs bid 07/20/18   Steele Sizer, MD  brimonidine (ALPHAGAN) 0.2 % ophthalmic solution USE 1 DROP IN BOTH EYE TWICE A DAY 09/30/16   [provider]  citalopram (CELEXA) 20 MG tablet Take 1 tablet (20 mg total) by mouth daily. 08/16/19   Steele Sizer, MD  clopidogrel (PLAVIX) 75  MG tablet Take 1 tablet (75 mg total) by mouth daily. 08/16/19   Steele Sizer, MD  Cyanocobalamin (B-12) 1000 MCG SUBL Place 1 tablet under the tongue daily. 08/16/19   Steele Sizer, MD  dorzolamide-timolol (COSOPT) 22.3-6.8 MG/ML ophthalmic solution USE 1 DROP IN BOTH EYES 2 TIMES A DAY 09/22/16   [provider]  losartan (COZAAR) 100 MG tablet Take 1 tablet (100 mg total) by mouth daily. Patient taking differently: Take 50 mg by mouth daily.  08/16/19   Steele Sizer, MD  metaxalone [facility]) 800 MG tablet Take by mouth. 12/15/18   [provider]  metFORMIN (GLUCOPHAGE-XR) 500 MG 24 hr tablet Take 2 tablets (1,000 mg total) by mouth daily with breakfast. Patient not taking: Reported on 11/30/2019 08/16/19   Steele Sizer, MD  Methylcobalamin (B12-ACTIVE) 1 MG CHEW Chew 2 tablets by mouth daily. Patient  not taking: Reported on 11/30/2019    [provider]  Multiple Vitamin (MULTIVITAMIN) tablet Take 1 tablet by mouth daily.    [provider]  prednisoLONE acetate (PRED FORTE) 1 % ophthalmic suspension USE 1 DROP IN RIGHT EYE 4 TIMES A DAY 07/27/15   [provider]  rosuvastatin (CRESTOR) 10 MG tablet Take 1 tablet (10 mg total) by mouth daily. 08/16/19   Steele Sizer, MD  timolol (BETIMOL) 0.5 % ophthalmic solution 1 drop. 01/06/07   [provider]  timolol (TIMOPTIC) 0.5 % ophthalmic solution 1 drop. Patient not taking: Reported on 11/30/2019 01/06/07   [provider]  Vitamin D, Ergocalciferol, (DRISDOL) 1.25 MG (50000 UNIT) CAPS capsule Take 1 capsule (50,000 Units total) by mouth every 7 (seven) days. 08/16/19   Steele Sizer, MD    Family History Family History  Problem Relation Age of Onset  . Heart disease Father   . Diabetes Mother   . Diabetes Brother   . Kidney disease Brother        Transplant  . Heart disease Brother   . Diabetes Sister   . Diabetes Sister     Social History Social History   Tobacco Use  . Smoking status: Current Every Day Smoker    Packs/day: 0.25    Years: 40.00    Pack years: 10.00    Types: Cigarettes    Start date: 03/16/2000  . Smokeless tobacco: Never Used  Vaping Use  . Vaping Use: Never used  Substance Use Topics  . Alcohol use: No    Alcohol/week: 0.0 standard drinks  . Drug use: No     Allergies   Other and Tapentadol hcl   Review of Systems Review of Systems  Constitutional: Positive for fatigue. Negative for chills and fever.  HENT: Negative for ear pain and sore throat.   Eyes: Negative for pain and visual disturbance.  Respiratory: Negative for cough and shortness of breath.   Cardiovascular: Negative for chest pain and palpitations.  Gastrointestinal: Negative for abdominal pain and vomiting.  Genitourinary: Negative for dysuria and hematuria.  Musculoskeletal: Negative  for arthralgias and back pain.  Skin: Negative for color change and rash.  Neurological: Positive for light-headedness. Negative for dizziness, tremors, seizures, syncope, facial asymmetry, speech difficulty, weakness, numbness and headaches.  All other systems reviewed and are negative.    Physical Exam Triage Vital Signs ED Triage Vitals  Enc Vitals Group     BP 11/30/19 1436 (!) 203/95     Pulse Rate 11/30/19 1436 89     Resp 11/30/19 1436 16     Temp 11/30/19  1436 98.4 F (36.9 C)     Temp src --      SpO2 11/30/19 1436 97 %     Weight --      Height --      Head Circumference --      Peak Flow --      Pain Score 11/30/19 1432 8     Pain Loc --      Pain Edu? --      Excl. in [place]? --    No data found.  Updated Vital Signs BP (!) 203/95   Pulse 89   Temp 98.4 F (36.9 C)   Resp 16   SpO2 97%   Visual Acuity Right Eye Distance:   Left Eye Distance:   Bilateral Distance:    Right Eye Near:   Left Eye Near:    Bilateral Near:     Physical Exam Vitals and nursing note reviewed.  Constitutional:      General: She is not in acute distress.    Appearance: She is well-developed. She is not ill-appearing.  HENT:     Head: Normocephalic and atraumatic.     Mouth/Throat:     Mouth: Mucous membranes are moist.  Eyes:     Conjunctiva/sclera: Conjunctivae normal.  Cardiovascular:     Rate and Rhythm: Normal rate and regular rhythm.     Heart sounds: No murmur heard.   Pulmonary:     Effort: Pulmonary effort is normal. No respiratory distress.     Breath sounds: Normal breath sounds.  Abdominal:     Palpations: Abdomen is soft.     Tenderness: There is no abdominal tenderness. There is no guarding or rebound.  Musculoskeletal:     Cervical back: Neck supple.  Skin:    General: Skin is warm and dry.  Neurological:     General: No focal deficit present.     Mental Status: She is alert.     Sensory: No sensory deficit.     Motor: No weakness.     Gait: Gait  normal.      UC Treatments / Results  Labs (all labs ordered are listed, but only abnormal results are displayed) Labs Reviewed - No data to display  EKG   Radiology No results found.  Procedures Procedures (including critical care time)  Medications Ordered in UC Medications - No data to display  Initial Impression / Assessment and Plan / UC Course  I have reviewed the triage vital signs and the nursing notes.  Pertinent labs & imaging results that were available during my care of the patient were reviewed by me and considered in my medical decision making (see chart for details).   Hypertensive urgency, lightheadedness, EKG abnormal: negative T wave in lead I, 1 mm ST depression in lead II, 1 mm ST elevation in lead V1; rate 86; compared to previous from 2011.  Patient refuses EMS; she states she will drive herself.  She is resistant to going to the ED; discussed possible outcomes at length with patient including MI or CVA.        Final Clinical Impressions(s) / UC Diagnoses   Final diagnoses:  Hypertensive urgency  Abnormal EKG  Light-headedness     Discharge Instructions     Go to the Emergency Department for your elevated blood pressure and EKG changes.        ED Prescriptions    None     PDMP not reviewed this encounter.   Barkley Boards  H, NP 11/30/19 3143

## 2019-11-30 NOTE — Progress Notes (Signed)
Name: Mckay Brandt   MRN: [id number]    DOB: 12/23/1945   Date:11/30/2019       Progress Note  Subjective:    Chief Complaint  Chief Complaint  Patient presents with  . Fatigue    no energy, no get up and go  . Hypertension    at home bp running high checked x3 pt states only taking 1/2 losartan     I connected with  Geoffery Lyons  on 11/30/19 at 10:40 AM EDT by a video enabled telemedicine application and verified that I am speaking with the correct person using two identifiers.  I discussed the limitations of evaluation and management by telemedicine and the availability of in person appointments. The patient expressed understanding and agreed to proceed. Staff also discussed with the patient that there may be a patient responsible charge related to this service. Patient Location:  home Provider Location: [facility] Additional Individuals present: none  HPI  Pt of Dr. Ancil Boozer  She presents for elevated BP reading at home and fatigue that has worsened over the past couple days, but has been ongoing and gradually worsening for months.  She has her next f/up visit in Sept and she didn't feel like she could wait that long.  Hypertension:  Currently managed on losartan - prescribed 100 mg tablet - she got a new bottle and different shape tablet and she discussed with Dr. Ancil Boozer about concerned SE (making her wet? Wet herself?) and she states PCP said ok to cut in half patient additionally tells me today that it will "not be in chart" because it was "a verbal conversation" but she states that this did occur at her most recent office visit in March Did review her med history today with her she has been on losartan 100 mg since September 2020 refilled in December and again in March.  Patient states that her urinary symptoms and side effects did not begin when she got her new bottle filled, urinary symptoms have been ongoing for some time and has been previously worked up by PCP and  urology In March her blood pressure was well controlled-  but reviewing flowsheet and BP readings HTN has otherwise uncontrolled for several years. Her home blood pressure reading today is extremely high. She has a cuff that goes on her arm, not her wrist.  She rarely uses it and does not regularly monitor her blood pressure at home.  With her recent office visits patient never checked her blood pressure at home soon after or before these office visits and she has never brought into clinic to check its accuracy. On 6/25 she had follow-up with vascular specialist.  Her initial blood pressure was reportedly very high but it was later rechecked by a different CMA and was 153/74.  Patient states that the first CMA was not doing it right.    BP Readings from Last 10 Encounters:  11/30/19 (!) 256/140  11/18/19 (!) 153/74  08/16/19 130/80  03/22/19 (!) 156/80  02/22/19 (!) 160/76  02/15/19 (!) 156/98  10/15/18 (!) 176/94  07/20/18 130/78  04/07/18 (!) 176/80  03/16/18 138/82  Patient is very difficult historian and cannot tell me when she began decreasing the losartan dose from 100 mg to 50 mg but she states that she does take her meds daily.  She endorses feeling badly, fatigued and sleepy but she denies lightheadedness, hypotension, syncope, CP, SOB, exertional sx, LE edema, palpitation, Ha's, visual disturbances.  She reports her worsening symptoms as  being "tired, can't seem to get anything done" "there is some depression" she is "just drifting off to sleep" which she has never done.  She also complains of side effects of her metformin - she has been on it for a while - causes loose and watery BM's, she reports being in the bathroom all day.  I asked her if she has discussed the side effect with her PCP because is been ongoing for a long time and she said that she told her PCP it did not "seem like the medicine for her" but is unclear if she told her about continued GI side effects every time she  takes it.  She further denies syncope, lightheadedness, dizziness, CP, Abd pain, N, V, HA's  Patient does have multiple comorbidities including peripheral artery disease with history of amputations, diabetes, depression, hyperlipidemia, chronic pain due to peripheral neuropathy and phantom limb pain, and she is a current smoker    Patient Active Problem List   Diagnosis Date Noted  . Uncontrolled type 2 diabetes with eye complications ([place]) 68/34/1962  . DM (diabetes mellitus), type 2 with peripheral vascular complications ([place]) 22/97/9892  . Amputated toe ([place]) 12/02/2014  . Benign essential HTN 12/02/2014  . Depression, major, recurrent, mild ([place]) 12/02/2014  . Dyslipidemia 12/02/2014  . Vitamin B12 deficiency 12/02/2014  . Peripheral neuropathic pain 12/02/2014  . Peripheral artery disease ([place]) 12/02/2014  . Phantom limb ([place]) 12/02/2014  . Diabetes mellitus with nephropathy ([place]) 12/02/2014  . Detached retina 12/02/2014  . Type 2 diabetes mellitus with peripheral neuropathy ([place]) 12/02/2014  . Tobacco use 12/02/2014  . Urge incontinence 12/02/2014  . 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  . Vitamin D deficiency 12/02/2014    Social History   Tobacco Use  . Smoking status: Current Every Day Smoker    Packs/day: 0.25    Years: 40.00    Pack years: 10.00    Types: Cigarettes    Start date: 03/16/2000  . Smokeless tobacco: Never Used  Substance Use Topics  . Alcohol use: No    Alcohol/week: 0.0 standard drinks     Current Outpatient Medications:  .  aspirin 81 MG tablet, Take 81 mg by mouth daily., Disp: , Rfl:  .  atropine 1 % ophthalmic solution, Place 1 drop into the right eye 3 (three) times daily., Disp: , Rfl:  .  brimonidine (ALPHAGAN) 0.2 % ophthalmic solution, USE 1 DROP IN BOTH EYE TWICE A DAY, Disp: , Rfl: 3 .  citalopram (CELEXA) 20 MG tablet, Take 1 tablet (20 mg total) by mouth daily., Disp: 90 tablet, Rfl: 1 .  clopidogrel (PLAVIX) 75 MG tablet, Take 1 tablet  (75 mg total) by mouth daily., Disp: 90 tablet, Rfl: 1 .  dorzolamide-timolol (COSOPT) 22.3-6.8 MG/ML ophthalmic solution, USE 1 DROP IN BOTH EYES 2 TIMES A DAY, Disp: , Rfl: 4 .  Multiple Vitamin (MULTIVITAMIN) tablet, Take 1 tablet by mouth daily., Disp: , Rfl:  .  prednisoLONE acetate (PRED FORTE) 1 % ophthalmic suspension, USE 1 DROP IN RIGHT EYE 4 TIMES A DAY, Disp: , Rfl: 3 .  rosuvastatin (CRESTOR) 10 MG tablet, Take 1 tablet (10 mg total) by mouth daily., Disp: 90 tablet, Rfl: 1 .  timolol (BETIMOL) 0.5 % ophthalmic solution, 1 drop., Disp: , Rfl:  .  Vitamin D, Ergocalciferol, (DRISDOL) 1.25 MG (50000 UNIT) CAPS capsule, Take 1 capsule (50,000 Units total) by mouth every 7 (seven) days., Disp: 8 capsule, Rfl: 1 .  blood glucose meter kit and  supplies, , Disp: , Rfl:  .  Blood Glucose Monitoring Suppl (ACCU-CHEK AVIVA CONNECT) w/Device KIT, 1 each by Does not apply route daily. And supplies E11.9 check fsbs bid, Disp: 1 kit, Rfl: 0 .  Cyanocobalamin (B-12) 1000 MCG SUBL, Place 1 tablet under the tongue daily., Disp: 30 tablet, Rfl: 5 .  losartan (COZAAR) 100 MG tablet, Take 1 tablet (100 mg total) by mouth daily. (Patient taking differently: Take 50 mg by mouth daily. ), Disp: 90 tablet, Rfl: 1 .  metaxalone (SKELAXIN) 800 MG tablet, Take by mouth., Disp: , Rfl:  .  metFORMIN (GLUCOPHAGE-XR) 500 MG 24 hr tablet, Take 2 tablets (1,000 mg total) by mouth daily with breakfast. (Patient not taking: Reported on 11/30/2019), Disp: 180 tablet, Rfl: 1 .  Methylcobalamin (B12-ACTIVE) 1 MG CHEW, Chew 2 tablets by mouth daily. (Patient not taking: Reported on 11/30/2019), Disp: , Rfl:  .  timolol (TIMOPTIC) 0.5 % ophthalmic solution, 1 drop. (Patient not taking: Reported on 11/30/2019), Disp: , Rfl:   Allergies  Allergen Reactions  . Other Itching    Nucinta  . Tapentadol Hcl    I personally reviewed active problem list, medication list, allergies, family history, social history, health maintenance,  notes from last encounter, lab results, imaging with the patient/caregiver today.    Review of Systems  10 Systems reviewed and are negative for acute change except as noted in the HPI.   Objective:   Virtual encounter, vitals limited, only able to obtain the following Today's Vitals   11/30/19 1043  BP: (!) 256/140  Pulse: 97  Weight: 144 lb (65.3 kg)  Height: _0  (1.626 m)   Body mass index is 24.72 kg/m. Nursing Note and Vital Signs reviewed.  Physical Exam Vitals and nursing note reviewed.  Neck:     Comments: Phonation clear Neurological:     Mental Status: She is alert.     Comments: Pt answering questions appropriately     PE limited by telephone encounter  No results found for this or any previous visit (from the past 72 hour(s)).  Assessment and Plan:   1. Hypertensive urgency Home reading extremely high per patient report today, she notes that she took it 3 times, she does not regularly use her home BP cuff and she has not used it near any of her prior visits (to compare order to assess accuracy).     She reports generalized and vague symptoms, over the phone and only with the history and not a physical exam its difficult to rule out endorgan damage, but from what she is telling me today I did not feel that I needed to send her to the ER but that she had to be seen in person today for vital signs to be done and a in person physical exam to be done.  I urged her to go to [facility] urgent care in [place] or [place], she inquired about [facility] walk-in clinic which I agreed would also be a excellent option with capability to assess her, do labs or EKG if needed.  I reviewed her meds and blood pressures with her today at length, advised that after she is evaluated if there is no contraindication, she should resume her losartan at the prescribed dose and follow-up in office.  Reviewed emergent signs and symptoms which should warrant ER visit or calling 911 and patient  verbalized understanding.  2. Fatigue, unspecified type Unclear etiology.  She does have a history of depression it may be a  symptom of worsening depression.  It may be related to watery bowel movements secondary to Metformin?  Or could even be cardiac in nature with her elevated blood pressure?  She reported just being tired, having trouble getting up and being motivated to do what she needs to do, and drifting off to sleep.  She had no concerning associated cardiac symptoms, she had no abdominal pain nausea or vomiting.  With her reported frequent watery bowel movements am slightly concerned with her fluid status and electrolytes.     Report called to [facility] urgent care   -Red flags and when to present for emergency care or call 911 if she experienced chest pain, shortness of breath, confusion, severe HA, focal weakness, numbness, slurred speech, facial droop, visual disturbances, near syncope-  reviewed with patient at time of visit.  I explained the patient's significantly increased risk for cardiovascular event like a stroke or heart attack if her blood pressure is in fact this elevated.  Patient wanted to come into the office the next day or delay in in person assessment but I explained that my medical advice was to get evaluated in person today.  Encouraged her to go to a [facility] urgent care or to her [facility] walk-in clinic because she would receive excellent care and I would be able to review records and results and be able to arrange good follow-up care here in office which would likely happen in the next 2 to 3 weeks.  When she had been evaluated and there was no endorgan damage or events suspected I did encourage her to restart taking losartan 100 mg daily and follow-up in office in 2 to 3 weeks.   I provided 30+ minutes of non-face-to-face time during this encounter.   21 min spent on phone with patient, more than 15 min additional time involved but was not limited to reviewing chart  (recent and pertinent OV notes and labs), documentation in EMR, and coordinating care and treatment plan, calling and arranging UC f/up (giving report).   Delsa Grana, PA-C 11/30/19 11:26 AM

## 2019-11-30 NOTE — Discharge Instructions (Signed)
Go to the Emergency Department for your elevated blood pressure and EKG changes.

## 2019-12-19 ENCOUNTER — Telehealth (INDEPENDENT_AMBULATORY_CARE_PROVIDER_SITE_OTHER): Payer: Self-pay

## 2019-12-19 NOTE — Telephone Encounter (Signed)
We can bring her in with ABIs at the earliest scheduled spot.  However I know the patient prefers to see Dr. Wyn Quaker and his schedule is tight this next month.  Her symptoms don't necessarily sound consistent with worsening PAD, however we can bring her in for an ABI only to see if there is any significant difference.  If it has worsened, we can discuss possible next steps, but if it is relatively the same she should see her PCP for workup.

## 2019-12-20 ENCOUNTER — Ambulatory Visit (INDEPENDENT_AMBULATORY_CARE_PROVIDER_SITE_OTHER): Payer: Medicare Other

## 2019-12-20 ENCOUNTER — Other Ambulatory Visit: Payer: Self-pay | Admitting: Family Medicine

## 2019-12-20 ENCOUNTER — Other Ambulatory Visit (INDEPENDENT_AMBULATORY_CARE_PROVIDER_SITE_OTHER): Payer: Self-pay | Admitting: Urology

## 2019-12-20 ENCOUNTER — Other Ambulatory Visit (INDEPENDENT_AMBULATORY_CARE_PROVIDER_SITE_OTHER): Payer: Self-pay | Admitting: Nurse Practitioner

## 2019-12-20 ENCOUNTER — Other Ambulatory Visit: Payer: Self-pay

## 2019-12-20 DIAGNOSIS — M79605 Pain in left leg: Secondary | ICD-10-CM

## 2019-12-20 DIAGNOSIS — M79604 Pain in right leg: Secondary | ICD-10-CM

## 2019-12-20 DIAGNOSIS — E559 Vitamin D deficiency, unspecified: Secondary | ICD-10-CM

## 2019-12-20 NOTE — Telephone Encounter (Signed)
Requested medication (s) are due for refill today -yes  Requested medication (s) are on the active medication list -yes  Future visit scheduled -yes  Last refill: 08/16/19 #8 1 RF  Notes to clinic: Request for non delegated Rx refill  Requested Prescriptions  Pending Prescriptions Disp Refills   Vitamin D, Ergocalciferol, (DRISDOL) 1.25 MG (50000 UNIT) CAPS capsule [Pharmacy Med Name: VITAMIN D2 1.25MG (50,000 UNIT)] 8 capsule 1    Sig: Take 1 capsule (50,000 Units total) by mouth every 7 (seven) days.      Endocrinology:  Vitamins - Vitamin D Supplementation Failed - 12/20/2019 12:14 PM      Failed - 50,000 IU strengths are not delegated      Failed - Phosphate in normal range and within 360 days    No results found for: PHOS        Passed - Ca in normal range and within 360 days    Calcium  Date Value Ref Range Status  11/30/2019 8.9 8.9 - 10.3 mg/dL Final          Passed - Vitamin D in normal range and within 360 days    Vit D, 25-Hydroxy  Date Value Ref Range Status  02/15/2019 57 30 - 100 ng/mL Final    Comment:    Vitamin D Status         25-OH Vitamin D: . Deficiency:                    <20 ng/mL Insufficiency:             20 - 29 ng/mL Optimal:                 > or = 30 ng/mL . For 25-OH Vitamin D testing on patients on  D2-supplementation and patients for whom quantitation  of D2 and D3 fractions is required, the QuestAssureD(TM) 25-OH VIT D, (D2,D3), LC/MS/MS is recommended: order  code 35361 (patients >33yrs). See Note 1 . Note 1 . For additional information, please refer to  http://education.QuestDiagnostics.com/faq/FAQ199  (This link is being provided for informational/ educational purposes only.)           Passed - Valid encounter within last 12 months    Recent Outpatient Visits           2 weeks ago Hypertensive urgency   [facility] Danelle Berry, PA-C   4 months ago Uncontrolled type 2 diabetes mellitus with complication  [facility])   [facility] [place], Danna Hefty, MD   10 months ago Uncontrolled type 2 diabetes mellitus with complication [facility])   [facility] [place], Danna Hefty, MD   10 months ago Diabetes mellitus type 2 with complications [facility])   [facility] Alba Cory, MD   1 year ago Diabetes mellitus type 2 with complications [facility])   [facility] Alba Cory, MD       Future Appointments             In 1 month Alba Cory, MD [facility]                Requested Prescriptions  Pending Prescriptions Disp Refills   Vitamin D, Ergocalciferol, (DRISDOL) 1.25 MG (50000 UNIT) CAPS capsule [Pharmacy Med Name: VITAMIN D2 1.25MG (50,000 UNIT)] 8 capsule 1    Sig: Take 1 capsule (50,000 Units total) by mouth every 7 (seven) days.      Endocrinology:  Vitamins - Vitamin  D Supplementation Failed - 12/20/2019 12:14 PM      Failed - 50,000 IU strengths are not delegated      Failed - Phosphate in normal range and within 360 days    No results found for: PHOS        Passed - Ca in normal range and within 360 days    Calcium  Date Value Ref Range Status  11/30/2019 8.9 8.9 - 10.3 mg/dL Final          Passed - Vitamin D in normal range and within 360 days    Vit D, 25-Hydroxy  Date Value Ref Range Status  02/15/2019 57 30 - 100 ng/mL Final    Comment:    Vitamin D Status         25-OH Vitamin D: . Deficiency:                    <20 ng/mL Insufficiency:             20 - 29 ng/mL Optimal:                 > or = 30 ng/mL . For 25-OH Vitamin D testing on patients on  D2-supplementation and patients for whom quantitation  of D2 and D3 fractions is required, the QuestAssureD(TM) 25-OH VIT D, (D2,D3), LC/MS/MS is recommended: order  code 50932 (patients >93yrs). See Note 1 . Note 1 . For additional information, please refer to  http://education.Quest[facility].com/faq/FAQ199  (This  link is being provided for informational/ educational purposes only.)           Passed - Valid encounter within last 12 months    Recent Outpatient Visits           2 weeks ago Hypertensive urgency   [facility] Danelle Berry, PA-C   4 months ago Uncontrolled type 2 diabetes mellitus with complication [facility])   [facility] Alba Cory, MD   10 months ago Uncontrolled type 2 diabetes mellitus with complication [facility])   [facility] Alba Cory, MD   10 months ago Diabetes mellitus type 2 with complications [facility])   [facility] Alba Cory, MD   1 year ago Diabetes mellitus type 2 with complications [facility])   [facility] Alba Cory, MD       Future Appointments             In 1 month Carlynn Purl, Danna Hefty, MD [facility]

## 2020-01-04 ENCOUNTER — Telehealth: Payer: Self-pay | Admitting: Family Medicine

## 2020-01-04 NOTE — Telephone Encounter (Signed)
Copied from CRM [id number]. Topic: Medicare AWV >> Jan 04, 2020 11:03 AM Claudette Laws R wrote: Reason for CRM:   Left message for patient to call back and schedule the Medicare Annual Wellness Visit (AWV) in office or virtual  Last AWV 07/20/2018  Please schedule at anytime with [facility] Health Advisor.  40 minute appointment  Any questions, please contact me at [phone]

## 2020-02-06 ENCOUNTER — Other Ambulatory Visit: Payer: Self-pay | Admitting: Family Medicine

## 2020-02-06 DIAGNOSIS — I739 Peripheral vascular disease, unspecified: Secondary | ICD-10-CM

## 2020-02-16 NOTE — Progress Notes (Signed)
Name: Lisa [name]   MRN: [id number]    DOB: 06-Feb-1946   Date:02/20/2020       Progress Note  Subjective  Chief Complaint  Chief Complaint  Patient presents with  . Diabetes  . Hypertension  . Depression  . Dyslipidemia    HPI  DMII: last A1C was at goal, we couldn't check today, hemoglobin was low. We will send her to the lab today. She states not checking glucose very often lately.  . She is taking Metformin 1000 mg daily , she did not increase to three daily as recommended . She has hypertension, dyslipidemia and history of urine micro, PAD . She is on Losartan, statin therapy. Eye exam is up to date -   Depression: she states the pandemic has gotten worse during the pandemic, she has been feeling isolated, her church closed, not hanging out with her sister in law. She would like to continue taking Celexa and will try socializing with her sister in law outdoors to help with her mood, cannot afford therapy at this time   HTN: she states now taking a full pill of Losartan, she went to [facility] July 2021 with an hypertensive urgency, she has noticed swelling of her legs but no orthopnea or SOB  Nocturnal enuresis: she has been using pull ups and pads, she states only happens when she sleeps in her bed, but when she sleeping in her chair she does not have nocturnal enuresis, discussed sleep study. She states she snores. She was referred to Urologist and was seen by a PA but she wants to see the doctor this time.   Nose bleeds/anemia unspecific: we will check labs, denies pica. Discussed importance of colonoscopy   PAD: she was recently seen by Dr. Lucky Cowboy, 06/25 and ABI was stable 0.7 on the right and 0.82 on left side She had daily pain, but some of it likely from neuropathy. . History of left distal foot amputation, she has phantom pain , she has pain on both legs , she has also noticed some swelling, pain was severe in July but is back to baseline now.    Patient Active Problem List    Diagnosis Date Noted  . Uncontrolled type 2 diabetes with eye complications ([place]) 38/18/2993  . DM (diabetes mellitus), type 2 with peripheral vascular complications ([place]) 71/69/6789  . Amputated toe ([place]) 12/02/2014  . Benign essential HTN 12/02/2014  . Depression, major, recurrent, mild ([place]) 12/02/2014  . Dyslipidemia 12/02/2014  . Vitamin B12 deficiency 12/02/2014  . Peripheral neuropathic pain 12/02/2014  . Peripheral artery disease ([place]) 12/02/2014  . Phantom limb ([place]) 12/02/2014  . Diabetes mellitus with nephropathy ([place]) 12/02/2014  . Detached retina 12/02/2014  . Type 2 diabetes mellitus with peripheral neuropathy ([place]) 12/02/2014  . Tobacco use 12/02/2014  . Urge incontinence 12/02/2014  . 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  . Vitamin D deficiency 12/02/2014    Past Surgical History:  Procedure Laterality Date  . ABDOMINAL HYSTERECTOMY  1992   Total  . EYE SURGERY    . TOE AMPUTATION Left 2011   All of her toes on the left foot  . VASCULAR SURGERY      Family History  Problem Relation Age of Onset  . Heart disease Father   . Diabetes Mother   . Diabetes Brother   . Kidney disease Brother        Transplant  . Heart disease Brother   . Diabetes Sister   . Diabetes Sister  Social History   Tobacco Use  . Smoking status: Current Every Day Smoker    Packs/day: 0.25    Years: 40.00    Pack years: 10.00    Types: Cigarettes    Start date: 03/16/2000  . Smokeless tobacco: Never Used  Substance Use Topics  . Alcohol use: No    Alcohol/week: 0.0 standard drinks     Current Outpatient Medications:  .  aspirin 81 MG tablet, Take 81 mg by mouth daily., Disp: , Rfl:  .  atropine 1 % ophthalmic solution, Place 1 drop into the right eye 3 (three) times daily., Disp: , Rfl:  .  blood glucose meter kit and supplies, , Disp: , Rfl:  .  Blood Glucose Monitoring Suppl (ACCU-CHEK AVIVA CONNECT) w/Device KIT, 1 each by Does not apply route daily. And supplies E11.9 check  fsbs bid, Disp: 1 kit, Rfl: 0 .  brimonidine (ALPHAGAN) 0.2 % ophthalmic solution, USE 1 DROP IN BOTH EYE TWICE A DAY, Disp: , Rfl: 3 .  citalopram (CELEXA) 20 MG tablet, Take 1 tablet (20 mg total) by mouth daily., Disp: 90 tablet, Rfl: 1 .  clopidogrel (PLAVIX) 75 MG tablet, TAKE 1 TABLET BY MOUTH EVERY DAY, Disp: 90 tablet, Rfl: 1 .  Cyanocobalamin (B-12) 1000 MCG SUBL, Place 1 tablet under the tongue daily., Disp: 30 tablet, Rfl: 5 .  dorzolamide-timolol (COSOPT) 22.3-6.8 MG/ML ophthalmic solution, USE 1 DROP IN BOTH EYES 2 TIMES A DAY, Disp: , Rfl: 4 .  losartan (COZAAR) 100 MG tablet, Take 1 tablet (100 mg total) by mouth daily. (Patient taking differently: Take 50 mg by mouth daily. ), Disp: 90 tablet, Rfl: 1 .  metFORMIN (GLUCOPHAGE-XR) 500 MG 24 hr tablet, Take 2 tablets (1,000 mg total) by mouth daily with breakfast., Disp: 180 tablet, Rfl: 1 .  Multiple Vitamin (MULTIVITAMIN) tablet, Take 1 tablet by mouth daily., Disp: , Rfl:  .  prednisoLONE acetate (PRED FORTE) 1 % ophthalmic suspension, USE 1 DROP IN RIGHT EYE 4 TIMES A DAY, Disp: , Rfl: 3 .  rosuvastatin (CRESTOR) 10 MG tablet, Take 1 tablet (10 mg total) by mouth daily., Disp: 90 tablet, Rfl: 1 .  timolol (BETIMOL) 0.5 % ophthalmic solution, 1 drop., Disp: , Rfl:  .  timolol (TIMOPTIC) 0.5 % ophthalmic solution, 1 drop., Disp: , Rfl:  .  Vitamin D, Ergocalciferol, (DRISDOL) 1.25 MG (50000 UNIT) CAPS capsule, TAKE 1 CAPSULE (50,000 UNITS TOTAL) BY MOUTH EVERY 7 (SEVEN) DAYS., Disp: 8 capsule, Rfl: 1 .  hydrochlorothiazide (HYDRODIURIL) 12.5 MG tablet, Take 1 tablet (12.5 mg total) by mouth daily., Disp: 90 tablet, Rfl: 1  Allergies  Allergen Reactions  . Other Itching    Nucinta  . Tapentadol Hcl     I personally reviewed active problem list, medication list, allergies, family history, social history, health maintenance with the patient/caregiver today.   ROS  Constitutional: Negative for fever or weight change.   Respiratory: Negative for cough and shortness of breath.   Cardiovascular: Negative for chest pain or palpitations.  Gastrointestinal: Negative for abdominal pain, no bowel changes.  Musculoskeletal: Negative for gait problem or joint swelling.  Skin: Negative for rash.  Neurological: Negative for dizziness or headache.  No other specific complaints in a complete review of systems (except as listed in HPI above).  Objective  Vitals:   02/17/20 1400  BP: (!) 150/100  Pulse: 75  Resp: 16  Temp: 98.2 F (36.8 C)  TempSrc: Oral  SpO2: 100%  Weight: 147  lb 8 oz (66.9 kg)  Height: 5\' 4"  (1.626 m)    Body mass index is 25.32 kg/m.  Physical Exam  Constitutional: Patient appears well-developed and well-nourished.  No distress.  HEENT: head atraumatic, normocephalic, pupils equal and reactive to light,  neck supple Cardiovascular: Normal rate, regular rhythm and normal heart sounds.  No murmur heard. positive for  BLE edema 1 plus  Pulmonary/Chest: Effort normal and breath sounds normal. No respiratory distress. Abdominal: Soft.  There is no tenderness. Muscular skeletal: transmetatarsal amputation  Psychiatric: Patient has a normal mood and affect. behavior is normal. Judgment and thought content normal.  Recent Results (from the past 2160 hour(s))  CBC with Differential     Status: Abnormal   Collection Time: 11/30/19  3:54 PM  Result Value Ref Range   WBC 5.3 4.0 - 10.5 K/uL   RBC 4.54 3.87 - 5.11 MIL/uL   Hemoglobin 11.8 (L) 12.0 - 15.0 g/dL   HCT 01/31/20 36 - 46 %   MCV 79.5 (L) 80.0 - 100.0 fL   MCH 26.0 26.0 - 34.0 pg   MCHC 32.7 30.0 - 36.0 g/dL   RDW 85.6 (H) 31.4 - 97.0 %   Platelets 202 150 - 400 K/uL   nRBC 0.0 0.0 - 0.2 %   Neutrophils Relative % 69 %   Neutro Abs 3.7 1.7 - 7.7 K/uL   Lymphocytes Relative 22 %   Lymphs Abs 1.2 0.7 - 4.0 K/uL   Monocytes Relative 8 %   Monocytes Absolute 0.4 0 - 1 K/uL   Eosinophils Relative 0 %   Eosinophils Absolute 0.0 0  - 0 K/uL   Basophils Relative 1 %   Basophils Absolute 0.0 0 - 0 K/uL   Immature Granulocytes 0 %   Abs Immature Granulocytes 0.02 0.00 - 0.07 K/uL    Comment: Performed at [facility], [address] [place], [place]  Basic metabolic panel     Status: Abnormal   Collection Time: 11/30/19  3:54 PM  Result Value Ref Range   Sodium 141 135 - 145 mmol/L   Potassium 3.5 3.5 - 5.1 mmol/L   Chloride 106 98 - 111 mmol/L   CO2 26 22 - 32 mmol/L   Glucose, Bld 180 (H) 70 - 99 mg/dL    Comment: Glucose reference range applies only to samples taken after fasting for at least 8 hours.   BUN 9 8 - 23 mg/dL   Creatinine, Ser 01/31/20 0.44 - 1.00 mg/dL   Calcium 8.9 8.9 - 5.02 mg/dL   GFR calc non Af Amer >60 >60 mL/min   GFR calc Af Amer >60 >60 mL/min   Anion gap 9 5 - 15    Comment: Performed at [facility], [address] [place], [place]  Lipid panel     Status: None   Collection Time: 02/17/20  2:56 PM  Result Value Ref Range   Cholesterol 151 <200 mg/dL   HDL 70 > OR = 50 mg/dL   Triglycerides 77 02/19/20 mg/dL   LDL Cholesterol (Calc) 65 mg/dL (calc)    Comment: Reference range: <100 . Desirable range <100 mg/dL for primary prevention;   <70 mg/dL for patients with CHD or diabetic patients  with > or = 2 CHD risk factors. <676 LDL-C is now calculated using the Martin-Hopkins  calculation, which is a validated novel method providing  better accuracy than the Friedewald equation in the  estimation of LDL-C.  Marland Kitchen  et al. JAMA. 5102;585(27): 2061-2068  (http://education.[facility].com/faq/FAQ164)    Total CHOL/HDL Ratio 2.2 <5.0 (calc)   Non-HDL Cholesterol (Calc) 81 <130 mg/dL (calc)    Comment: For patients with diabetes plus 1 major ASCVD risk  factor, treating to a non-HDL-C goal of <100 mg/dL  (LDL-C of <70 mg/dL) is considered a therapeutic  option.   Microalbumin / creatinine urine ratio     Status: None   Collection Time:  02/17/20  2:56 PM  Result Value Ref Range   Creatinine, Urine 76 20 - 275 mg/dL   Microalb, Ur 1.2 mg/dL    Comment: Reference Range Not established    Microalb Creat Ratio 16 <30 mcg/mg creat    Comment: . The ADA defines abnormalities in albumin excretion as follows: Marland Kitchen Albuminuria Category        Result (mcg/mg creatinine) . Normal to Mildly increased   <30 Moderately increased         30-299  Severely increased           > OR = 300 . The ADA recommends that at least two of three specimens collected within a 3-6 month period be abnormal before considering a patient to be within a diagnostic category.   CBC with Differential/Platelet     Status: Abnormal   Collection Time: 02/17/20  2:56 PM  Result Value Ref Range   WBC 5.7 3.8 - 10.8 Thousand/uL   RBC 4.60 3.80 - 5.10 Million/uL   Hemoglobin 12.1 11.7 - 15.5 g/dL   HCT 38.2 35 - 45 %   MCV 83.0 80.0 - 100.0 fL   MCH 26.3 (L) 27.0 - 33.0 pg   MCHC 31.7 (L) 32.0 - 36.0 g/dL   RDW 17.3 (H) 11.0 - 15.0 %   Platelets 299 140 - 400 Thousand/uL   MPV 11.0 7.5 - 12.5 fL   Neutro Abs 3,466 1,500 - 7,800 cells/uL   Lymphs Abs 1,676 850 - 3,900 cells/uL   Absolute Monocytes 507 200 - 950 cells/uL   Eosinophils Absolute 29 15 - 500 cells/uL   Basophils Absolute 23 0 - 200 cells/uL   Neutrophils Relative % 60.8 %   Total Lymphocyte 29.4 %   Monocytes Relative 8.9 %   Eosinophils Relative 0.5 %   Basophils Relative 0.4 %  COMPLETE METABOLIC PANEL WITH GFR     Status: Abnormal   Collection Time: 02/17/20  2:56 PM  Result Value Ref Range   Glucose, Bld 109 (H) 65 - 99 mg/dL    Comment: .            Fasting reference interval . For someone without known diabetes, a glucose value between 100 and 125 mg/dL is consistent with prediabetes and should be confirmed with a follow-up test. .    BUN 8 7 - 25 mg/dL   Creat 0.89 0.60 - 0.93 mg/dL    Comment: For patients >49 years of age, the reference limit for Creatinine is  approximately 13% higher for people identified as African-American. .    GFR, Est Non African American 64 > OR = 60 mL/min/1.42m2   GFR, Est African American 74 > OR = 60 mL/min/1.73m2   BUN/Creatinine Ratio NOT APPLICABLE 6 - 22 (calc)   Sodium 140 135 - 146 mmol/L   Potassium 4.0 3.5 - 5.3 mmol/L   Chloride 105 98 - 110 mmol/L   CO2 29 20 - 32 mmol/L   Calcium 9.4 8.6 - 10.4 mg/dL   Total Protein 6.5 6.1 -  8.1 g/dL   Albumin 4.0 3.6 - 5.1 g/dL   Globulin 2.5 1.9 - 3.7 g/dL (calc)   AG Ratio 1.6 1.0 - 2.5 (calc)   Total Bilirubin 0.6 0.2 - 1.2 mg/dL   Alkaline phosphatase (APISO) 84 37 - 153 U/L   AST 18 10 - 35 U/L   ALT 14 6 - 29 U/L  VITAMIN D 25 Hydroxy (Vit-D Deficiency, Fractures)     Status: None   Collection Time: 02/17/20  2:56 PM  Result Value Ref Range   Vit D, 25-Hydroxy 68 30 - 100 ng/mL    Comment: Vitamin D Status         25-OH Vitamin D: . Deficiency:                    <20 ng/mL Insufficiency:             20 - 29 ng/mL Optimal:                 > or = 30 ng/mL . For 25-OH Vitamin D testing on patients on  D2-supplementation and patients for whom quantitation  of D2 and D3 fractions is required, the [facility](TM) 25-OH VIT D, (D2,D3), LC/MS/MS is recommended: order  code [phone] (patients >27yrs). See Note 1 . Note 1 . For additional information, please refer to  http://education.[facility].com/faq/FAQ199  (This link is being provided for informational/ educational purposes only.)   Vitamin B12     Status: None   Collection Time: 02/17/20  2:56 PM  Result Value Ref Range   Vitamin B-12 404 200 - 1,100 pg/mL  Iron, TIBC and Ferritin Panel     Status: Abnormal   Collection Time: 02/17/20  2:56 PM  Result Value Ref Range   Iron 46 45 - 160 mcg/dL   TIBC 371 250 - 450 mcg/dL (calc)   %SAT 12 (L) 16 - 45 % (calc)   Ferritin 28 16 - 288 ng/mL  Hemoglobin A1c     Status: Abnormal   Collection Time: 02/17/20  2:56 PM  Result Value Ref Range    Hgb A1c MFr Bld 8.5 (H) <5.7 % of total Hgb    Comment: For someone without known diabetes, a hemoglobin A1c value of 6.5% or greater indicates that they may have  diabetes and this should be confirmed with a follow-up  test. . For someone with known diabetes, a value <7% indicates  that their diabetes is well controlled and a value  greater than or equal to 7% indicates suboptimal  control. A1c targets should be individualized based on  duration of diabetes, age, comorbid conditions, and  other considerations. . Currently, no consensus exists regarding use of hemoglobin A1c for diagnosis of diabetes for children. .    Mean Plasma Glucose 197 (calc)   eAG (mmol/L) 10.9 (calc)    Diabetic Foot Exam: Diabetic Foot Exam - Simple   Simple Foot Form Diabetic Foot exam was performed with the following findings: Yes 02/17/2020  2:08 PM  Visual Inspection See comments: Yes Sensation Testing See comments: Yes Pulse Check See comments: Yes Comments History of amputation of toes left foot, decrease distal pulses       PHQ2/9: Depression screen [facility] 2/9 02/17/2020 02/17/2020 11/30/2019 08/16/2019 02/15/2019  Decreased Interest 1 1 2 1 1   Down, Depressed, Hopeless 1 1 1  0 1  PHQ - 2 Score 2 2 3 1 2   Altered sleeping 2 - 2 0 1  Tired, decreased energy 2 -  $'3 1 1  'v$ Change in appetite 1 - 0 0 0  Feeling bad or failure about yourself  0 - 0 0 0  Trouble concentrating 0 - 1 0 0  Moving slowly or fidgety/restless 0 - 0 0 0  Suicidal thoughts 0 - 0 0 0  PHQ-9 Score 7 - $R'9 2 4  'zG$ Difficult doing work/chores Somewhat difficult - Somewhat difficult Not difficult at all Not difficult at all  Some recent data might be hidden    phq 9 is positive    Fall Risk: Fall Risk  02/17/2020 11/30/2019 08/16/2019 02/22/2019 02/15/2019  Falls in the past year? 0 1 0 1 1  Number falls in past yr: 0 1 0 1 1  Comment - - - - -  Injury with Fall? 0 0 0 0 0  Comment - - - - -  Follow up - - - - -       Functional Status Survey: Is the patient deaf or have difficulty hearing?: No Does the patient have difficulty seeing, even when wearing glasses/contacts?: Yes Does the patient have difficulty concentrating, remembering, or making decisions?: No Does the patient have difficulty walking or climbing stairs?: Yes Does the patient have difficulty dressing or bathing?: No Does the patient have difficulty doing errands alone such as visiting a doctor's office or shopping?: No    Assessment & Plan  1. DM (diabetes mellitus), type 2 with peripheral vascular complications (HCC)  - Microalbumin / creatinine urine ratio - Hemoglobin A1c  2. Need for immunization against influenza  - Flu Vaccine QUAD High Dose(Fluad)  3. Depression, major, recurrent, mild (HCC)  - citalopram (CELEXA) 20 MG tablet; Take 1 tablet (20 mg total) by mouth daily.  Dispense: 90 tablet; Refill: 1  4. Diabetes mellitus with nephropathy (HCC)  - metFORMIN (GLUCOPHAGE-XR) 500 MG 24 hr tablet; Take 2 tablets (1,000 mg total) by mouth daily with breakfast.  Dispense: 180 tablet; Refill: 1  5. Dyslipidemia  - rosuvastatin (CRESTOR) 10 MG tablet; Take 1 tablet (10 mg total) by mouth daily.  Dispense: 90 tablet; Refill: 1 - Lipid panel  6. Peripheral artery disease (HCC)  - rosuvastatin (CRESTOR) 10 MG tablet; Take 1 tablet (10 mg total) by mouth daily.  Dispense: 90 tablet; Refill: 1  7. Phantom limb ([place])   8. Vitamin B12 deficiency  - Vitamin B12  9. Vitamin D deficiency  - VITAMIN D 25 Hydroxy (Vit-D Deficiency, Fractures)  10. Status post transmetatarsal amputation of foot, left [facility])  Seen by Dr. Lucky Cowboy June   11. Uncontrolled hypertension  - Microalbumin / creatinine urine ratio - hydrochlorothiazide (HYDRODIURIL) 12.5 MG tablet; Take 1 tablet (12.5 mg total) by mouth daily.  Dispense: 90 tablet; Refill: 1  12. Anemia, unspecified type  - CBC with Differential/Platelet - COMPLETE  METABOLIC PANEL WITH GFR - Iron, TIBC and Ferritin Panel  Explained importance of having colonoscopy but she refuses at this time   13. Hypertension associated with type 2 diabetes mellitus ([place])  Explained importance of taking medications as prescription

## 2020-02-17 ENCOUNTER — Encounter: Payer: Self-pay | Admitting: Family Medicine

## 2020-02-17 ENCOUNTER — Ambulatory Visit (INDEPENDENT_AMBULATORY_CARE_PROVIDER_SITE_OTHER): Payer: Medicare Other | Admitting: Family Medicine

## 2020-02-17 ENCOUNTER — Other Ambulatory Visit: Payer: Self-pay

## 2020-02-17 VITALS — BP 150/100 | HR 75 | Temp 98.2°F | Resp 16 | Ht 64.0 in | Wt 147.5 lb

## 2020-02-17 DIAGNOSIS — D649 Anemia, unspecified: Secondary | ICD-10-CM | POA: Diagnosis not present

## 2020-02-17 DIAGNOSIS — G547 Phantom limb syndrome without pain: Secondary | ICD-10-CM | POA: Diagnosis not present

## 2020-02-17 DIAGNOSIS — E1165 Type 2 diabetes mellitus with hyperglycemia: Secondary | ICD-10-CM | POA: Diagnosis not present

## 2020-02-17 DIAGNOSIS — I739 Peripheral vascular disease, unspecified: Secondary | ICD-10-CM

## 2020-02-17 DIAGNOSIS — E785 Hyperlipidemia, unspecified: Secondary | ICD-10-CM | POA: Diagnosis not present

## 2020-02-17 DIAGNOSIS — E1151 Type 2 diabetes mellitus with diabetic peripheral angiopathy without gangrene: Secondary | ICD-10-CM | POA: Diagnosis not present

## 2020-02-17 DIAGNOSIS — E538 Deficiency of other specified B group vitamins: Secondary | ICD-10-CM

## 2020-02-17 DIAGNOSIS — Z89432 Acquired absence of left foot: Secondary | ICD-10-CM

## 2020-02-17 DIAGNOSIS — E1121 Type 2 diabetes mellitus with diabetic nephropathy: Secondary | ICD-10-CM | POA: Diagnosis not present

## 2020-02-17 DIAGNOSIS — Z23 Encounter for immunization: Secondary | ICD-10-CM

## 2020-02-17 DIAGNOSIS — R0683 Snoring: Secondary | ICD-10-CM

## 2020-02-17 DIAGNOSIS — E118 Type 2 diabetes mellitus with unspecified complications: Secondary | ICD-10-CM | POA: Diagnosis not present

## 2020-02-17 DIAGNOSIS — F330 Major depressive disorder, recurrent, mild: Secondary | ICD-10-CM | POA: Diagnosis not present

## 2020-02-17 DIAGNOSIS — I10 Essential (primary) hypertension: Secondary | ICD-10-CM | POA: Diagnosis not present

## 2020-02-17 DIAGNOSIS — E559 Vitamin D deficiency, unspecified: Secondary | ICD-10-CM | POA: Diagnosis not present

## 2020-02-17 DIAGNOSIS — N3944 Nocturnal enuresis: Secondary | ICD-10-CM

## 2020-02-17 DIAGNOSIS — E1159 Type 2 diabetes mellitus with other circulatory complications: Secondary | ICD-10-CM

## 2020-02-17 MED ORDER — METFORMIN HCL ER 500 MG PO TB24: 1000.0000 mg | ORAL_TABLET | Freq: Every day | ORAL | 1 refills | Status: DC

## 2020-02-17 MED ORDER — CITALOPRAM HYDROBROMIDE 20 MG PO TABS: 20.0000 mg | ORAL_TABLET | Freq: Every day | ORAL | 1 refills | Status: DC

## 2020-02-17 MED ORDER — ROSUVASTATIN CALCIUM 10 MG PO TABS: 10.0000 mg | ORAL_TABLET | Freq: Every day | ORAL | 1 refills | Status: DC

## 2020-02-17 MED ORDER — HYDROCHLOROTHIAZIDE 12.5 MG PO TABS: 12.5000 mg | ORAL_TABLET | Freq: Every day | ORAL | 1 refills | Status: DC

## 2020-02-18 LAB — CBC WITH DIFFERENTIAL/PLATELET
Absolute Monocytes: 507 cells/uL (ref 200–950)
Basophils Absolute: 23 cells/uL (ref 0–200)
Basophils Relative: 0.4 %
Eosinophils Absolute: 29 cells/uL (ref 15–500)
Eosinophils Relative: 0.5 %
HCT: 38.2 % (ref 35.0–45.0)
Hemoglobin: 12.1 g/dL (ref 11.7–15.5)
Lymphs Abs: 1676 cells/uL (ref 850–3900)
MCH: 26.3 pg — ABNORMAL LOW (ref 27.0–33.0)
MCHC: 31.7 g/dL — ABNORMAL LOW (ref 32.0–36.0)
MCV: 83 fL (ref 80.0–100.0)
MPV: 11 fL (ref 7.5–12.5)
Monocytes Relative: 8.9 %
Neutro Abs: 3466 cells/uL (ref 1500–7800)
Neutrophils Relative %: 60.8 %
Platelets: 299 10*3/uL (ref 140–400)
RBC: 4.6 10*6/uL (ref 3.80–5.10)
RDW: 17.3 % — ABNORMAL HIGH (ref 11.0–15.0)
Total Lymphocyte: 29.4 %
WBC: 5.7 10*3/uL (ref 3.8–10.8)

## 2020-02-18 LAB — COMPLETE METABOLIC PANEL WITH GFR
AG Ratio: 1.6 (calc) (ref 1.0–2.5)
ALT: 14 U/L (ref 6–29)
AST: 18 U/L (ref 10–35)
Albumin: 4 g/dL (ref 3.6–5.1)
Alkaline phosphatase (APISO): 84 U/L (ref 37–153)
BUN: 8 mg/dL (ref 7–25)
CO2: 29 mmol/L (ref 20–32)
Calcium: 9.4 mg/dL (ref 8.6–10.4)
Chloride: 105 mmol/L (ref 98–110)
Creat: 0.89 mg/dL (ref 0.60–0.93)
GFR, Est African American: 74 mL/min/{1.73_m2} (ref >=60)
GFR, Est Non African American: 64 mL/min/{1.73_m2} (ref >=60)
Globulin: 2.5 g/dL (calc) (ref 1.9–3.7)
Glucose, Bld: 109 mg/dL — ABNORMAL HIGH (ref 65–99)
Potassium: 4 mmol/L (ref 3.5–5.3)
Sodium: 140 mmol/L (ref 135–146)
Total Bilirubin: 0.6 mg/dL (ref 0.2–1.2)
Total Protein: 6.5 g/dL (ref 6.1–8.1)

## 2020-02-18 LAB — MICROALBUMIN / CREATININE URINE RATIO
Creatinine, Urine: 76 mg/dL (ref 20–275)
Microalb Creat Ratio: 16 mcg/mg creat (ref <30)
Microalb, Ur: 1.2 mg/dL

## 2020-02-18 LAB — VITAMIN D 25 HYDROXY (VIT D DEFICIENCY, FRACTURES): Vit D, 25-Hydroxy: 68 ng/mL (ref 30–100)

## 2020-02-18 LAB — LIPID PANEL
Cholesterol: 151 mg/dL (ref <200)
HDL: 70 mg/dL (ref >=50)
LDL Cholesterol (Calc): 65 mg/dL (calc)
Non-HDL Cholesterol (Calc): 81 mg/dL (calc) (ref <130)
Total CHOL/HDL Ratio: 2.2 (calc) (ref <5.0)
Triglycerides: 77 mg/dL (ref <150)

## 2020-02-18 LAB — HEMOGLOBIN A1C
Hgb A1c MFr Bld: 8.5 % of total Hgb — ABNORMAL HIGH (ref <5.7)
Mean Plasma Glucose: 197 (calc)
eAG (mmol/L): 10.9 (calc)

## 2020-02-18 LAB — IRON,TIBC AND FERRITIN PANEL
%SAT: 12 % (calc) — ABNORMAL LOW (ref 16–45)
Ferritin: 28 ng/mL (ref 16–288)
Iron: 46 ug/dL (ref 45–160)
TIBC: 371 mcg/dL (calc) (ref 250–450)

## 2020-02-18 LAB — VITAMIN B12: Vitamin B-12: 404 pg/mL (ref 200–1100)

## 2020-02-29 ENCOUNTER — Telehealth: Payer: Self-pay

## 2020-02-29 NOTE — Telephone Encounter (Signed)
Copied from CRM [id number]. Topic: General - Inquiry >> Feb 27, 2020  3:35 PM Lisa [name] wrote: Reason for CRM: Pt called saying Dois Davenport called in ref to her labs .  Pt's call back is [phone]  Patient said she just started taking her metformin as prescribed a few weeks ago. She said she was only taking one metformin after you prescribed the 2. She does not seem to be willing to increase it any more. I told her we can see how it does taking it as prescribed then adjust if needed.

## 2020-03-14 ENCOUNTER — Telehealth: Payer: Self-pay | Admitting: Family Medicine

## 2020-03-14 NOTE — Telephone Encounter (Signed)
Copied from CRM [id number]. Topic: Medicare AWV >> Mar 14, 2020 10:12 AM Claudette Laws R wrote: Reason for CRM:  Left message for patient to call back and schedule the Medicare Annual Wellness Visit (AWV) in office or virtual  Last AWV 07/20/2018  Please schedule at anytime with [facility] Health Advisor.  40 minute appointment  Any questions, please contact me at [phone]

## 2020-03-20 DIAGNOSIS — H401122 Primary open-angle glaucoma, left eye, moderate stage: Secondary | ICD-10-CM | POA: Diagnosis not present

## 2020-04-23 ENCOUNTER — Ambulatory Visit: Payer: Medicare Other | Admitting: Family Medicine

## 2020-05-07 ENCOUNTER — Other Ambulatory Visit (INDEPENDENT_AMBULATORY_CARE_PROVIDER_SITE_OTHER): Payer: Self-pay | Admitting: Nurse Practitioner

## 2020-05-07 DIAGNOSIS — I739 Peripheral vascular disease, unspecified: Secondary | ICD-10-CM

## 2020-05-08 ENCOUNTER — Encounter (INDEPENDENT_AMBULATORY_CARE_PROVIDER_SITE_OTHER): Payer: Medicare Other

## 2020-05-08 ENCOUNTER — Ambulatory Visit (INDEPENDENT_AMBULATORY_CARE_PROVIDER_SITE_OTHER): Payer: Medicare Other | Admitting: Vascular Surgery

## 2020-05-16 DIAGNOSIS — Z23 Encounter for immunization: Secondary | ICD-10-CM | POA: Diagnosis not present

## 2020-06-12 ENCOUNTER — Ambulatory Visit: Payer: Medicare Other | Admitting: Family Medicine

## 2020-06-15 ENCOUNTER — Ambulatory Visit (INDEPENDENT_AMBULATORY_CARE_PROVIDER_SITE_OTHER): Payer: Medicare Other | Admitting: Vascular Surgery

## 2020-06-15 ENCOUNTER — Encounter (INDEPENDENT_AMBULATORY_CARE_PROVIDER_SITE_OTHER): Payer: Medicare Other

## 2020-06-18 NOTE — Progress Notes (Unsigned)
Name: Lisa [name]   MRN: [id number]    DOB: 1945/07/04   Date:06/19/2020       Progress Note  Subjective  Chief Complaint  Follow up   HPI   DMII: last A1C was at goal, we couldn't check today, hemoglobin was low. We will send her to the lab today. She states not checking glucose very often lately.  . She is taking Metformin 500 mg ER  daily sometimes twice a day and has diarrhea, A1C is going up again, explained importance of changing medication to decrease diarrhea and control glucose but she does not want to add or change medication. We will give her higher dose of Metformin at 750 and Actos 15 mg  .. She has hypertension, dyslipidemia and history of urine micro, PAD . She is on Losartan, statin therapy. Eye exam is up to date   Depression: she states she is doing better, she is not feeling as isolated. She states she has been more outspoken to her siblings and has helped her mood    HTN: she states now taking a full pill of Losartan and also HCTZ 12.5 mg daily and bp is at goal today, no chest pain or palpitation   Nocturnal enuresis: she has been using pull ups and pads, she states only happens when she sleeps in her bed, but when she sleeping in her chair she does not have nocturnal enuresis, discussed sleep study. She states she snores. She was referred to Urologist , but they advised her to have sleep study first. She states she wants to hold off due to COVID-19 numbers being so high. She will call when ready to have it done. She states only one accident since she started to sleep in her recliner   PAD: she was recently seen by Dr. Lucky Cowboy, 06/25 and ABI was stable 0.7 on the right and 0.82 on left side She had daily pain, but some of it likely from neuropathy. . History of left distal foot amputation, she has phantom pain , she has pain on both legs , she has an appointment in Feb for follow up  Patient Active Problem List   Diagnosis Date Noted  . Uncontrolled type 2 diabetes with  eye complications ([place]) 95/28/4132  . DM (diabetes mellitus), type 2 with peripheral vascular complications ([place]) 44/05/270  . Amputated toe ([place]) 12/02/2014  . Benign essential HTN 12/02/2014  . Depression, major, recurrent, mild ([place]) 12/02/2014  . Dyslipidemia 12/02/2014  . Vitamin B12 deficiency 12/02/2014  . Peripheral neuropathic pain 12/02/2014  . Peripheral artery disease ([place]) 12/02/2014  . Phantom limb ([place]) 12/02/2014  . Diabetes mellitus with nephropathy ([place]) 12/02/2014  . Detached retina 12/02/2014  . Type 2 diabetes mellitus with peripheral neuropathy ([place]) 12/02/2014  . Tobacco use 12/02/2014  . Urge incontinence 12/02/2014  . 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  . Vitamin D deficiency 12/02/2014    Past Surgical History:  Procedure Laterality Date  . ABDOMINAL HYSTERECTOMY  1992   Total  . EYE SURGERY    . TOE AMPUTATION Left 2011   All of her toes on the left foot  . VASCULAR SURGERY      Family History  Problem Relation Age of Onset  . Heart disease Father   . Diabetes Mother   . Diabetes Brother   . Kidney disease Brother        Transplant  . Heart disease Brother   . Diabetes Sister   . Diabetes Sister  Social History   Tobacco Use  . Smoking status: Current Every Day Smoker    Packs/day: 0.25    Years: 40.00    Pack years: 10.00    Types: Cigarettes    Start date: 03/16/2000  . Smokeless tobacco: Never Used  Substance Use Topics  . Alcohol use: No    Alcohol/week: 0.0 standard drinks     Current Outpatient Medications:  .  aspirin 81 MG tablet, Take 81 mg by mouth daily., Disp: , Rfl:  .  atropine 1 % ophthalmic solution, Place 1 drop into the right eye 3 (three) times daily., Disp: , Rfl:  .  blood glucose meter kit and supplies, , Disp: , Rfl:  .  Blood Glucose Monitoring Suppl (ACCU-CHEK AVIVA CONNECT) w/Device KIT, 1 each by Does not apply route daily. And supplies E11.9 check fsbs bid, Disp: 1 kit, Rfl: 0 .  brimonidine (ALPHAGAN)  0.2 % ophthalmic solution, USE 1 DROP IN BOTH EYE TWICE A DAY, Disp: , Rfl: 3 .  citalopram (CELEXA) 20 MG tablet, Take 1 tablet (20 mg total) by mouth daily., Disp: 90 tablet, Rfl: 1 .  clopidogrel (PLAVIX) 75 MG tablet, TAKE 1 TABLET BY MOUTH EVERY DAY, Disp: 90 tablet, Rfl: 1 .  Cyanocobalamin (B-12) 1000 MCG SUBL, Place 1 tablet under the tongue daily., Disp: 30 tablet, Rfl: 5 .  dorzolamide-timolol (COSOPT) 22.3-6.8 MG/ML ophthalmic solution, USE 1 DROP IN BOTH EYES 2 TIMES A DAY, Disp: , Rfl: 4 .  hydrochlorothiazide (HYDRODIURIL) 12.5 MG tablet, Take 1 tablet (12.5 mg total) by mouth daily., Disp: 90 tablet, Rfl: 1 .  losartan (COZAAR) 100 MG tablet, Take 1 tablet (100 mg total) by mouth daily. (Patient taking differently: Take 50 mg by mouth daily.), Disp: 90 tablet, Rfl: 1 .  metFORMIN (GLUCOPHAGE-XR) 500 MG 24 hr tablet, Take 2 tablets (1,000 mg total) by mouth daily with breakfast., Disp: 180 tablet, Rfl: 1 .  Multiple Vitamin (MULTIVITAMIN) tablet, Take 1 tablet by mouth daily., Disp: , Rfl:  .  prednisoLONE acetate (PRED FORTE) 1 % ophthalmic suspension, USE 1 DROP IN RIGHT EYE 4 TIMES A DAY, Disp: , Rfl: 3 .  rosuvastatin (CRESTOR) 10 MG tablet, Take 1 tablet (10 mg total) by mouth daily., Disp: 90 tablet, Rfl: 1 .  timolol (BETIMOL) 0.5 % ophthalmic solution, 1 drop., Disp: , Rfl:  .  timolol (TIMOPTIC) 0.5 % ophthalmic solution, 1 drop., Disp: , Rfl:  .  Vitamin D, Ergocalciferol, (DRISDOL) 1.25 MG (50000 UNIT) CAPS capsule, TAKE 1 CAPSULE (50,000 UNITS TOTAL) BY MOUTH EVERY 7 (SEVEN) DAYS., Disp: 8 capsule, Rfl: 1  Allergies  Allergen Reactions  . Other Itching    Nucinta  . Tapentadol Hcl     I personally reviewed active problem list, medication list, allergies, family history, social history, health maintenance with the patient/caregiver today.   ROS  Constitutional: Negative for fever or weight change.  Respiratory: Negative for cough and shortness of breath.    Cardiovascular: Negative for chest pain or palpitations.  Gastrointestinal: Negative for abdominal pain, no bowel changes.  Musculoskeletal: Negative for gait problem or joint swelling.  Skin:postiive for rash.  Neurological:positive for dizziness once about one month ago, she sat down and symptoms resolved, no  headache.  No other specific complaints in a complete review of systems (except as listed in HPI above).  Objective  Vitals:   06/19/20 1503  BP: (!) 142/88  Pulse: 62  Resp: 16  Temp: 98.1 F (36.7 C)  TempSrc: Oral  SpO2: 94%  Weight: 143 lb 14.4 oz (65.3 kg)  Height: 5\' 4"  (1.626 m)    Body mass index is 24.7 kg/m.  Physical Exam  Constitutional: Patient appears well-developed and well-nourished.  No distress.  HEENT: head atraumatic, normocephalic, pupils equal and reactive to light,neck supple Cardiovascular: Normal rate, regular rhythm and normal heart sounds.  No murmur heard. No BLE edema. Pulmonary/Chest: Effort normal and breath sounds normal. No respiratory distress. Abdominal: Soft.  There is no tenderness. Skin: eczematous patches on back  Psychiatric: Patient has a normal mood and affect. behavior is normal. Judgment and thought content normal.  PHQ2/9: Depression screen [facility] 2/9 06/19/2020 02/17/2020 02/17/2020 11/30/2019 08/16/2019  Decreased Interest 1 1 1 2 1   Down, Depressed, Hopeless 1 1 1 1  0  PHQ - 2 Score 2 2 2 3 1   Altered sleeping 0 2 - 2 0  Tired, decreased energy 1 2 - 3 1  Change in appetite 0 1 - 0 0  Feeling bad or failure about yourself  0 0 - 0 0  Trouble concentrating 0 0 - 1 0  Moving slowly or fidgety/restless 0 0 - 0 0  Suicidal thoughts 0 0 - 0 0  PHQ-9 Score 3 7 - 9 2  Difficult doing work/chores Not difficult at all Somewhat difficult - Somewhat difficult Not difficult at all  Some recent data might be hidden    phq 9 is positive  Fall Risk: Fall Risk  06/19/2020 02/17/2020 11/30/2019 08/16/2019 02/22/2019  Falls in the past  year? 0 0 1 0 1  Number falls in past yr: 0 0 1 0 1  Comment - - - - -  Injury with Fall? 0 0 0 0 0  Comment - - - - -  Follow up - - - - -    Functional Status Survey: Is the patient deaf or have difficulty hearing?: No Does the patient have difficulty seeing, even when wearing glasses/contacts?: Yes Does the patient have difficulty concentrating, remembering, or making decisions?: No Does the patient have difficulty walking or climbing stairs?: Yes Does the patient have difficulty dressing or bathing?: No Does the patient have difficulty doing errands alone such as visiting a doctor's office or shopping?: No   Assessment & Plan  1. DM (diabetes mellitus), type 2 with peripheral vascular complications (HCC)  - POCT HgB A1C  2. Dyslipidemia  - rosuvastatin (CRESTOR) 10 MG tablet; Take 1 tablet (10 mg total) by mouth daily.  Dispense: 90 tablet; Refill: 1  3. Diabetes mellitus with nephropathy (HCC)  - metFORMIN (GLUCOPHAGE-XR) 750 MG 24 hr tablet; Take 1 tablet (750 mg total) by mouth daily with breakfast.  Dispense: 90 tablet; Refill: 0 - losartan (COZAAR) 100 MG tablet; Take 1 tablet (100 mg total) by mouth daily.  Dispense: 90 tablet; Refill: 0  4. Depression, major, recurrent, mild (HCC)  - citalopram (CELEXA) 20 MG tablet; Take 1 tablet (20 mg total) by mouth daily.  Dispense: 90 tablet; Refill: 1  5. Peripheral artery disease (HCC)  - rosuvastatin (CRESTOR) 10 MG tablet; Take 1 tablet (10 mg total) by mouth daily.  Dispense: 90 tablet; Refill: 1 - clopidogrel (PLAVIX) 75 MG tablet; Take 1 tablet (75 mg total) by mouth daily.  Dispense: 90 tablet; Refill: 1  6. Vitamin D deficiency  - Vitamin D, Ergocalciferol, (DRISDOL) 1.25 MG (50000 UNIT) CAPS capsule; Take 1 capsule (50,000 Units total) by mouth every 7 (seven) days.  Dispense: 12 capsule;  Refill: 1 - hydrochlorothiazide (HYDRODIURIL) 12.5 MG tablet; Take 1 tablet (12.5 mg total) by mouth daily.  Dispense: 90  tablet; Refill: 1  7. Hypertension associated with type 2 diabetes mellitus ([place])   8. Status post transmetatarsal amputation of foot, left ([place])   9. Vitamin B12 deficiency   10. Breast cancer screening by mammogram  - MM Digital Screening; Future  11. Phantom limb (HCC)   12. Eczematous skin lesions  - triamcinolone ointment (KENALOG) 0.1 %; Apply 1 application topically 2 (two) times daily. Mix with Eucerin at home  Dispense: 453.6 g; Refill: 0

## 2020-06-19 ENCOUNTER — Ambulatory Visit (INDEPENDENT_AMBULATORY_CARE_PROVIDER_SITE_OTHER): Payer: Medicare Other | Admitting: Family Medicine

## 2020-06-19 ENCOUNTER — Encounter: Payer: Self-pay | Admitting: Family Medicine

## 2020-06-19 ENCOUNTER — Other Ambulatory Visit: Payer: Self-pay

## 2020-06-19 VITALS — BP 142/88 | HR 62 | Temp 98.1°F | Resp 16 | Ht 64.0 in | Wt 143.9 lb

## 2020-06-19 DIAGNOSIS — F330 Major depressive disorder, recurrent, mild: Secondary | ICD-10-CM

## 2020-06-19 DIAGNOSIS — L989 Disorder of the skin and subcutaneous tissue, unspecified: Secondary | ICD-10-CM | POA: Diagnosis not present

## 2020-06-19 DIAGNOSIS — G547 Phantom limb syndrome without pain: Secondary | ICD-10-CM | POA: Diagnosis not present

## 2020-06-19 DIAGNOSIS — Z1231 Encounter for screening mammogram for malignant neoplasm of breast: Secondary | ICD-10-CM

## 2020-06-19 DIAGNOSIS — E1159 Type 2 diabetes mellitus with other circulatory complications: Secondary | ICD-10-CM

## 2020-06-19 DIAGNOSIS — E1151 Type 2 diabetes mellitus with diabetic peripheral angiopathy without gangrene: Secondary | ICD-10-CM | POA: Diagnosis not present

## 2020-06-19 DIAGNOSIS — Z89432 Acquired absence of left foot: Secondary | ICD-10-CM

## 2020-06-19 DIAGNOSIS — I739 Peripheral vascular disease, unspecified: Secondary | ICD-10-CM | POA: Diagnosis not present

## 2020-06-19 DIAGNOSIS — E1121 Type 2 diabetes mellitus with diabetic nephropathy: Secondary | ICD-10-CM

## 2020-06-19 DIAGNOSIS — E538 Deficiency of other specified B group vitamins: Secondary | ICD-10-CM

## 2020-06-19 DIAGNOSIS — E785 Hyperlipidemia, unspecified: Secondary | ICD-10-CM | POA: Diagnosis not present

## 2020-06-19 DIAGNOSIS — E559 Vitamin D deficiency, unspecified: Secondary | ICD-10-CM

## 2020-06-19 DIAGNOSIS — I152 Hypertension secondary to endocrine disorders: Secondary | ICD-10-CM

## 2020-06-19 LAB — POCT GLYCOSYLATED HEMOGLOBIN (HGB A1C): Hemoglobin A1C: 8.8 % — AB (ref 4.0–5.6)

## 2020-06-19 MED ORDER — PIOGLITAZONE HCL 15 MG PO TABS: 15.0000 mg | ORAL_TABLET | Freq: Every day | ORAL | 0 refills | Status: DC

## 2020-06-19 MED ORDER — VITAMIN D (ERGOCALCIFEROL) 1.25 MG (50000 UNIT) PO CAPS: 50000.0000 [IU] | ORAL_CAPSULE | ORAL | 1 refills | Status: DC

## 2020-06-19 MED ORDER — LOSARTAN POTASSIUM 100 MG PO TABS: 100.0000 mg | ORAL_TABLET | Freq: Every day | ORAL | 0 refills | Status: DC

## 2020-06-19 MED ORDER — HYDROCHLOROTHIAZIDE 12.5 MG PO TABS: 12.5000 mg | ORAL_TABLET | Freq: Every day | ORAL | 1 refills | Status: DC

## 2020-06-19 MED ORDER — TRIAMCINOLONE ACETONIDE 0.1 % EX OINT
1.0000 | TOPICAL_OINTMENT | Freq: Two times a day (BID) | CUTANEOUS | 0 refills | Status: DC
Start: 2020-06-19 — End: 2021-03-03

## 2020-06-19 MED ORDER — CLOPIDOGREL BISULFATE 75 MG PO TABS: 75.0000 mg | ORAL_TABLET | Freq: Every day | ORAL | 1 refills | Status: DC

## 2020-06-19 MED ORDER — METFORMIN HCL ER 750 MG PO TB24: 750.0000 mg | ORAL_TABLET | Freq: Every day | ORAL | 0 refills | Status: DC

## 2020-06-19 MED ORDER — ROSUVASTATIN CALCIUM 10 MG PO TABS: 10.0000 mg | ORAL_TABLET | Freq: Every day | ORAL | 1 refills | Status: DC

## 2020-06-19 MED ORDER — CITALOPRAM HYDROBROMIDE 20 MG PO TABS
20.0000 mg | ORAL_TABLET | Freq: Every day | ORAL | 1 refills | Status: DC
Start: 2020-06-19 — End: 2021-01-18

## 2020-06-19 NOTE — Addendum Note (Signed)
Addended by: Alba Cory F on: 06/19/2020 03:44 PM   Modules accepted: Orders

## 2020-07-17 ENCOUNTER — Encounter (INDEPENDENT_AMBULATORY_CARE_PROVIDER_SITE_OTHER): Payer: Self-pay | Admitting: Vascular Surgery

## 2020-07-17 ENCOUNTER — Ambulatory Visit (INDEPENDENT_AMBULATORY_CARE_PROVIDER_SITE_OTHER): Payer: Medicare Other

## 2020-07-17 ENCOUNTER — Ambulatory Visit (INDEPENDENT_AMBULATORY_CARE_PROVIDER_SITE_OTHER): Payer: Medicare Other | Admitting: Vascular Surgery

## 2020-07-17 ENCOUNTER — Other Ambulatory Visit: Payer: Self-pay

## 2020-07-17 VITALS — BP 164/78 | HR 67 | Ht 64.0 in | Wt 146.0 lb

## 2020-07-17 DIAGNOSIS — E1151 Type 2 diabetes mellitus with diabetic peripheral angiopathy without gangrene: Secondary | ICD-10-CM

## 2020-07-17 DIAGNOSIS — I10 Essential (primary) hypertension: Secondary | ICD-10-CM | POA: Diagnosis not present

## 2020-07-17 DIAGNOSIS — I739 Peripheral vascular disease, unspecified: Secondary | ICD-10-CM

## 2020-07-17 DIAGNOSIS — E785 Hyperlipidemia, unspecified: Secondary | ICD-10-CM

## 2020-07-17 NOTE — Progress Notes (Signed)
MRN : [id number]  Lisa [name] is a 75 y.o. (1946/02/22) female who presents with chief complaint of  Chief Complaint  Patient presents with  . Follow-up    6 Mo U/S   .  History of Present Illness: Patient returns today in follow up of her PAD.  She is doing well today without new complaints.  She has chronic neuropathic pain in her feet but this is really not changed.  No new ulceration.  She was started on a blood pressure medicine including a diuretic and her swelling in her legs is gone down. Her ABIs stable at 0.82 on the right and 0.73 on the left with decent monophasic waveforms.  Current Outpatient Medications  Medication Sig Dispense Refill  . aspirin 81 MG tablet Take 81 mg by mouth daily.    Marland Kitchen atropine 1 % ophthalmic solution Place 1 drop into the right eye 3 (three) times daily.    . blood glucose meter kit and supplies     . Blood Glucose Monitoring Suppl (ACCU-CHEK AVIVA CONNECT) w/Device KIT 1 each by Does not apply route daily. And supplies E11.9 check fsbs bid 1 kit 0  . brimonidine (ALPHAGAN) 0.2 % ophthalmic solution USE 1 DROP IN BOTH EYE TWICE A DAY  3  . citalopram (CELEXA) 20 MG tablet Take 1 tablet (20 mg total) by mouth daily. 90 tablet 1  . clopidogrel (PLAVIX) 75 MG tablet Take 1 tablet (75 mg total) by mouth daily. 90 tablet 1  . Cyanocobalamin (B-12) 1000 MCG SUBL Place 1 tablet under the tongue daily. 30 tablet 5  . dorzolamide-timolol (COSOPT) 22.3-6.8 MG/ML ophthalmic solution USE 1 DROP IN BOTH EYES 2 TIMES A DAY  4  . hydrochlorothiazide (HYDRODIURIL) 12.5 MG tablet Take 1 tablet (12.5 mg total) by mouth daily. 90 tablet 1  . losartan (COZAAR) 100 MG tablet Take 1 tablet (100 mg total) by mouth daily. 90 tablet 0  . metFORMIN (GLUCOPHAGE-XR) 750 MG 24 hr tablet Take 1 tablet (750 mg total) by mouth daily with breakfast. 90 tablet 0  . Multiple Vitamin (MULTIVITAMIN) tablet Take 1 tablet by mouth daily.    . pioglitazone (ACTOS) 15 MG tablet Take 1  tablet (15 mg total) by mouth daily. 90 tablet 0  . prednisoLONE acetate (PRED FORTE) 1 % ophthalmic suspension USE 1 DROP IN RIGHT EYE 4 TIMES A DAY  3  . rosuvastatin (CRESTOR) 10 MG tablet Take 1 tablet (10 mg total) by mouth daily. 90 tablet 1  . timolol (BETIMOL) 0.5 % ophthalmic solution 1 drop.    . timolol (TIMOPTIC) 0.5 % ophthalmic solution 1 drop.    . triamcinolone ointment (KENALOG) 0.1 % Apply 1 application topically 2 (two) times daily. Mix with Eucerin at home 453.6 g 0  . Vitamin D, Ergocalciferol, (DRISDOL) 1.25 MG (50000 UNIT) CAPS capsule Take 1 capsule (50,000 Units total) by mouth every 7 (seven) days. 12 capsule 1   No current facility-administered medications for this visit.    Past Medical History:  Diagnosis Date  . Blindness of right eye   . Depression   . Diabetes mellitus without complication ([place])   . DVT of leg (deep venous thrombosis) ([place])   . Glaucoma   . Hyperlipidemia   . Hypertension   . Vascular disease     Past Surgical History:  Procedure Laterality Date  . ABDOMINAL HYSTERECTOMY  1992   Total  . EYE SURGERY    . TOE AMPUTATION Left 2011  All of her toes on the left foot  . VASCULAR SURGERY       Social History   Tobacco Use  . Smoking status: Current Every Day Smoker    Packs/day: 0.25    Years: 40.00    Pack years: 10.00    Types: Cigarettes    Start date: 03/16/2000  . Smokeless tobacco: Never Used  Vaping Use  . Vaping Use: Never used  Substance Use Topics  . Alcohol use: No    Alcohol/week: 0.0 standard drinks  . Drug use: No      Family History  Problem Relation Age of Onset  . Heart disease Father   . Diabetes Mother   . Diabetes Brother   . Kidney disease Brother        Transplant  . Heart disease Brother   . Diabetes Sister   . Diabetes Sister      Allergies  Allergen Reactions  . Other Itching    Nucinta  . Tapentadol Hcl     REVIEW OF SYSTEMS (Negative unless checked)  Constitutional:  [] ?Weight loss  [] ?Fever  [] ?Chills Cardiac: [] ?Chest pain   [] ?Chest pressure   [] ?Palpitations   [] ?Shortness of breath when laying flat   [] ?Shortness of breath at rest   [] ?Shortness of breath with exertion. Vascular:  [x] ?Pain in legs with walking   [x] ?Pain in legs at rest   [] ?Pain in legs when laying flat   [] ?Claudication   [] ?Pain in feet when walking  [] ?Pain in feet at rest  [] ?Pain in feet when laying flat   [] ?History of DVT   [] ?Phlebitis   [] ?Swelling in legs   [] ?Varicose veins   [] ?Non-healing ulcers Pulmonary:   [] ?Uses home oxygen   [] ?Productive cough   [] ?Hemoptysis   [] ?Wheeze  [] ?COPD   [] ?Asthma Neurologic:  [] ?Dizziness  [] ?Blackouts   [] ?Seizures   [] ?History of stroke   [] ?History of TIA  [] ?Aphasia   [] ?Temporary blindness   [] ?Dysphagia   [] ?Weakness or numbness in arms   [] ?Weakness or numbness in legs Musculoskeletal:  [x] ?Arthritis   [] ?Joint swelling   [x] ?Joint pain   [] ?Low back pain Hematologic:  [] ?Easy bruising  [] ?Easy bleeding   [] ?Hypercoagulable state   [] ?Anemic   Gastrointestinal:  [] ?Blood in stool   [] ?Vomiting blood  [] ?Gastroesophageal reflux/heartburn   [] ?Abdominal pain Genitourinary:  [] ?Chronic kidney disease   [] ?Difficult urination  [] ?Frequent urination  [] ?Burning with urination   [] ?Hematuria Skin:  [] ?Rashes   [] ?Ulcers   [] ?Wounds Psychological:  [] ?History of anxiety   [] ? History of major depression.  Physical Examination  BP (!) 164/78   Pulse 67   Ht 5\' 4"  (1.626 m)   Wt 146 lb (66.2 kg)   BMI 25.06 kg/m  Gen:  WD/WN, NAD Head: [place]/AT, No temporalis wasting. Ear/Nose/Throat: Hearing grossly intact, nares w/o erythema or drainage Eyes: Conjunctiva clear. Sclera non-icteric Neck: Supple.  Trachea midline Pulmonary:  Good air movement, no use of accessory muscles.  Cardiac: RRR, no JVD Vascular:  Vessel Right Left  Radial Palpable Palpable                          PT  1+ palpable  1+ palpable  DP  1+ palpable  1+  palpable   Gastrointestinal: soft, non-tender/non-distended. No guarding/reflex.  Musculoskeletal: M/S 5/5 throughout.  No deformity or atrophy.  Left TMA well-healed.  Trace bilateral lower extremity edema. Neurologic: Sensation grossly intact in extremities.  Symmetrical.  Speech is fluent.  Psychiatric: Judgment intact, Mood & affect appropriate for pt's clinical situation. Dermatologic: No rashes or ulcers noted.  No cellulitis or open wounds.       Labs Recent Results (from the past 2160 hour(s))  POCT HgB A1C     Status: Abnormal   Collection Time: 06/19/20  3:12 PM  Result Value Ref Range   Hemoglobin A1C 8.8 (A) 4.0 - 5.6 %   HbA1c POC (<> result, manual entry)     HbA1c, POC (prediabetic range)     HbA1c, POC (controlled diabetic range)      Radiology No results found.  Assessment/Plan Benign essential HTN blood pressure control important in reducing the progression of atherosclerotic disease. On appropriate oral medications.   DM (diabetes mellitus), type 2 with peripheral vascular complications (HCC) blood glucose control important in reducing the progression of atherosclerotic disease. Also, involved in wound healing. On appropriate medications.   Dyslipidemia lipid control important in reducing the progression of atherosclerotic disease. Continue statin therapy  Peripheral artery disease Her ABIs stable at 0.82 on the right and 0.73 on the left with decent monophasic waveforms.  She has chronic neuropathic pain but no new symptoms.  No rest pain or ulceration. She is status post left transmetatarsal amputation many years ago.   Doing well.  No changes in medical regimen.  Recheck in 6 months.    Leotis Pain, MD  07/17/2020 3:21 PM    This note was created with Dragon medical transcription system.  Any errors from dictation are purely unintentional

## 2020-07-30 ENCOUNTER — Ambulatory Visit: Payer: Self-pay | Admitting: *Deleted

## 2020-07-30 NOTE — Telephone Encounter (Addendum)
Pt called in wanting to make an appt with Dr. Carlynn Purl because she passed out Wednesday night coming out of the bathroom.  She hit her head "real hard on the tile".   She was unable to get up and laid on the bathroom floor from 3:AM until 7 AM.   She was finally able to pull herself up.   She tried to get up multiple times after falling but could not is reason she laid on the floor for several hours.  She told the agent prior to being transferred to me that she had "had several spells" since then.  She denied this when I asked her about it on a couple of occasions during our conversation.  Her brother came to stay with her due to the fall.   He has A. Fib. And the ambulance had to come to her house last night and take him to the hospital where he now.  He was admitted so she has no one with her.  She is refusing to go to the ED.   There's no one that can take me plus I need a bath first.   My sister in law can bring me some food later this evening.   I'll call her.   She wants to wait until in the morning and go to the hospital.    "I'm not going now".    See notes below for more details.  I sent my notes to [facility] for Dr. Carlynn Purl high priority.  I called the office and they are getting my message to Dr. Carlynn Purl now.    Reason for Disposition . [1] ACUTE NEURO SYMPTOM AND [2] now fine  (DEFINITION: difficult to awaken OR confused thinking and talking OR slurred speech OR weakness of arms OR unsteady walking)  Answer Assessment - Initial Assessment Questions 1. MECHANISM: "How did the injury happen?" For falls, ask: "What height did you fall from?" and "What surface did you fall against?"      I fell Wednesday.  A bad fall.  I went to the bathroom at 3 AM, I peed, got up, washed hands and was on the way out the door.   I felt a spacy feeling coming over me.   I tried to get back to the toilet seat to sit down.  Before I could reach toilet I fell and hit my head very hard on the  tile.   I slid across the floor.   I could not get up.  I have a huge bathroom.   I laid there after attempting to get up multiple times and could not.   I went to sleep.   At 7:00 AM I was still in the floor.   I didn't have my phone with me so Could not call anyone.   I grabbed the toilet seat and pulled myself up.   I remember hitting the floor with my head.   My head is sore and my body hurts all over.   2. ONSET: "When did the injury happen?" (Minutes or hours ago)      Wednesday.    3. NEUROLOGIC SYMPTOMS: "Was there any loss of consciousness?" "Are there any other neurological symptoms?"      I feel like I'm going to pass out at times since the fall.   I have to be careful because I feel lightheaded.    My brother was staying here with me but he has A.Fib. and ended up in the  hospital.   4. MENTAL STATUS: "Does the person know who he is, who you are, and where he is?"      I can go to the hospital in the morning.   I'm not going now. Years ago I was on Dilantin for a seizure I had twice.   I've been off Dilantin for many years.  5. LOCATION: "What part of the head was hit?"      I fell straight back and hit the top of my head.  I don't feel a knot on my head. I'm by myself since my brother is in the hospital.   I need to call someone to bring me some food.   I'm not going to the hospital today.   My sister-in-law can take me to the hospital in the morning. 6. SCALP APPEARANCE: "What does the scalp look like? Is it bleeding now?" If Yes, ask: "Is it difficult to stop?"      No cuts or knots. 7. SIZE: For cuts, bruises, or swelling, ask: "How large is it?" (e.g., inches or centimeters)      None 8. PAIN: "Is there any pain?" If Yes, ask: "How bad is it?"  (e.g., Scale 1-10; or mild, moderate, severe)     I can't hardly use my right arm because it's sore.   From the elbow to the shoulder and my neck and back is so sore.   The pain is from the right side.   9. TETANUS: For any breaks in the  skin, ask: "When was the last tetanus booster?"     Not asked 10. OTHER SYMPTOMS: "Do you have any other symptoms?" (e.g., neck pain, vomiting)       I can't hardly get to the bathroom anymore.   I need a bath.    11. PREGNANCY: "Is there any chance you are pregnant?" "When was your last menstrual period?"       N/A due to age  Protocols used: HEAD INJURY-A-AH

## 2020-07-31 NOTE — Telephone Encounter (Signed)
Pt states she never went to ED. Feeling "a pinch" better. No dizziness and hasn't passed out anymore. Would like to come in and have an OV.

## 2020-07-31 NOTE — Telephone Encounter (Signed)
Pt was advised on two different occasions to go to Urgent Care or the ER and pt declined after being told we have no openings at this time

## 2020-09-21 NOTE — Progress Notes (Signed)
Name: Lisa [name]   MRN: [id number]    DOB: 1946/03/31   Date:09/24/2020       Progress Note  Subjective  Chief Complaint  Frequent Urination  HPI  Urinary frequency: started several months ago, sometimes she voids every 8 minutes ( it happened last week), she also has some dysuria, no hematuria , fever or chills.   Change in bowel movements: she states going on for a few months now, stools are watery and multiple times a day, feeling fatigued, no blood in stools. Explained that she needs to have a colonoscopy She states started when she started taking it, she states she held dose this past weekend and bowel movements not as often. She never picked Actos  from the pharmacy, also discussed considering adding Januvia to see if it will help with glucose without side effects   Patient Active Problem List   Diagnosis Date Noted  . Uncontrolled type 2 diabetes with eye complications ([place]) 71/24/5809  . DM (diabetes mellitus), type 2 with peripheral vascular complications ([place]) 98/33/8250  . Amputated toe ([place]) 12/02/2014  . Benign essential HTN 12/02/2014  . Depression, major, recurrent, mild ([place]) 12/02/2014  . Dyslipidemia 12/02/2014  . Vitamin B12 deficiency 12/02/2014  . Peripheral neuropathic pain 12/02/2014  . Peripheral artery disease ([place]) 12/02/2014  . Phantom limb ([place]) 12/02/2014  . Diabetes mellitus with nephropathy ([place]) 12/02/2014  . Detached retina 12/02/2014  . Type 2 diabetes mellitus with peripheral neuropathy ([place]) 12/02/2014  . Tobacco use 12/02/2014  . Urge incontinence 12/02/2014  . 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  . Vitamin D deficiency 12/02/2014    Past Surgical History:  Procedure Laterality Date  . ABDOMINAL HYSTERECTOMY  1992   Total  . EYE SURGERY    . TOE AMPUTATION Left 2011   All of her toes on the left foot  . VASCULAR SURGERY      Family History  Problem Relation Age of Onset  . Heart disease Father   . Diabetes Mother   . Diabetes Brother   .  Kidney disease Brother        Transplant  . Heart disease Brother   . Diabetes Sister   . Diabetes Sister     Social History   Tobacco Use  . Smoking status: Current Every Day Smoker    Packs/day: 0.25    Years: 40.00    Pack years: 10.00    Types: Cigarettes    Start date: 03/16/2000  . Smokeless tobacco: Never Used  Substance Use Topics  . Alcohol use: No    Alcohol/week: 0.0 standard drinks     Current Outpatient Medications:  .  aspirin 81 MG tablet, Take 81 mg by mouth daily., Disp: , Rfl:  .  atropine 1 % ophthalmic solution, Place 1 drop into the right eye 3 (three) times daily., Disp: , Rfl:  .  blood glucose meter kit and supplies, , Disp: , Rfl:  .  Blood Glucose Monitoring Suppl (ACCU-CHEK AVIVA CONNECT) w/Device KIT, 1 each by Does not apply route daily. And supplies E11.9 check fsbs bid, Disp: 1 kit, Rfl: 0 .  brimonidine (ALPHAGAN) 0.2 % ophthalmic solution, USE 1 DROP IN BOTH EYE TWICE A DAY, Disp: , Rfl: 3 .  citalopram (CELEXA) 20 MG tablet, Take 1 tablet (20 mg total) by mouth daily., Disp: 90 tablet, Rfl: 1 .  clopidogrel (PLAVIX) 75 MG tablet, Take 1 tablet (75 mg total) by mouth daily., Disp: 90 tablet, Rfl: 1 .  Cyanocobalamin (  B-12) 1000 MCG SUBL, Place 1 tablet under the tongue daily., Disp: 30 tablet, Rfl: 5 .  dorzolamide-timolol (COSOPT) 22.3-6.8 MG/ML ophthalmic solution, USE 1 DROP IN BOTH EYES 2 TIMES A DAY, Disp: , Rfl: 4 .  hydrochlorothiazide (HYDRODIURIL) 12.5 MG tablet, Take 1 tablet (12.5 mg total) by mouth daily., Disp: 90 tablet, Rfl: 1 .  losartan (COZAAR) 100 MG tablet, Take 1 tablet (100 mg total) by mouth daily., Disp: 90 tablet, Rfl: 0 .  Multiple Vitamin (MULTIVITAMIN) tablet, Take 1 tablet by mouth daily., Disp: , Rfl:  .  prednisoLONE acetate (PRED FORTE) 1 % ophthalmic suspension, USE 1 DROP IN RIGHT EYE 4 TIMES A DAY, Disp: , Rfl: 3 .  rosuvastatin (CRESTOR) 10 MG tablet, Take 1 tablet (10 mg total) by mouth daily., Disp: 90 tablet,  Rfl: 1 .  sitaGLIPtin (JANUVIA) 100 MG tablet, Take 1 tablet (100 mg total) by mouth daily., Disp: 90 tablet, Rfl: 0 .  timolol (BETIMOL) 0.5 % ophthalmic solution, 1 drop., Disp: , Rfl:  .  timolol (TIMOPTIC) 0.5 % ophthalmic solution, 1 drop., Disp: , Rfl:  .  triamcinolone ointment (KENALOG) 0.1 %, Apply 1 application topically 2 (two) times daily. Mix with Eucerin at home, Disp: 453.6 g, Rfl: 0 .  Vitamin D, Ergocalciferol, (DRISDOL) 1.25 MG (50000 UNIT) CAPS capsule, Take 1 capsule (50,000 Units total) by mouth every 7 (seven) days., Disp: 12 capsule, Rfl: 1 .  pioglitazone (ACTOS) 15 MG tablet, Take 1 tablet (15 mg total) by mouth daily., Disp: 90 tablet, Rfl: 0  Allergies  Allergen Reactions  . Other Itching    Nucinta  . Tapentadol Hcl     I personally reviewed active problem list, medication list, allergies, family history, social history, health maintenance with the patient/caregiver today.   ROS   Constitutional: Negative for fever, positive for  weight change.  Respiratory: Negative for cough and shortness of breath.   Cardiovascular: Negative for chest pain or palpitations.  Gastrointestinal: Negative for abdominal pain, no bowel changes.  Musculoskeletal: positive  for gait problem but no  joint swelling.  Skin: Negative for rash.  Neurological: Negative for dizziness or headache.  No other specific complaints in a complete review of systems (except as listed in HPI above).   Objective  Vitals:   09/24/20 1533  BP: 134/72  Pulse: 71  Resp: 16  Temp: 98.8 F (37.1 C)  TempSrc: Oral  SpO2: 97%  Weight: 138 lb (62.6 kg)  Height: _0  (1.626 m)    Body mass index is 23.69 kg/m.  Physical Exam  Constitutional: Patient appears well-developed and well-nourished.  No distress.  HEENT: head atraumatic, normocephalic, pupils equal and reactive to light,  neck supple Cardiovascular: Normal rate, regular rhythm and normal heart sounds.  No murmur heard. No BLE  edema. Pulmonary/Chest: Effort normal and breath sounds normal. No respiratory distress. Abdominal: Soft.  There is no tenderness. Psychiatric: Patient has a normal mood and affect. behavior is normal. Judgment and thought content normal.   PHQ2/9: Depression screen [facility] 2/9 09/24/2020 06/19/2020 02/17/2020 02/17/2020 11/30/2019  Decreased Interest 0 _1 Down, Depressed, Hopeless 0 _2 PHQ - 2 Score 0 _3 Altered sleeping - 0 2 - 2  Tired, decreased energy - 1 2 - 3  Change in appetite - 0 1 - 0  Feeling bad or failure about yourself  - 0 0 - 0  Trouble concentrating - 0 0 -  1  Moving slowly or fidgety/restless - 0 0 - 0  Suicidal thoughts - 0 0 - 0  PHQ-9 Score - 3 7 - 9  Difficult doing work/chores - Not difficult at all Somewhat difficult - Somewhat difficult  Some recent data might be hidden    phq 9 is negative   Fall Risk: Fall Risk  09/24/2020 06/19/2020 02/17/2020 11/30/2019 08/16/2019  Falls in the past year? 1 0 0 1 0  Number falls in past yr: 0 0 0 1 0  Comment - - - - -  Injury with Fall? 0 0 0 0 0  Comment - - - - -  Follow up - - - - -     Functional Status Survey: Is the patient deaf or have difficulty hearing?: Yes Does the patient have difficulty seeing, even when wearing glasses/contacts?: No Does the patient have difficulty concentrating, remembering, or making decisions?: No Does the patient have difficulty walking or climbing stairs?: Yes Does the patient have difficulty dressing or bathing?: No Does the patient have difficulty doing errands alone such as visiting a doctor's office or shopping?: No    Assessment & Plan  1. Urinary frequency  - POCT Urinalysis Dipstick - CULTURE, URINE COMPREHENSIVE  2. DM (diabetes mellitus), type 2 with peripheral vascular complications (HCC)  - pioglitazone (ACTOS) 15 MG tablet; Take 1 tablet (15 mg total) by mouth daily.  Dispense: 90 tablet; Refill: 0 - sitaGLIPtin (JANUVIA) 100 MG tablet; Take 1 tablet  (100 mg total) by mouth daily.  Dispense: 90 tablet; Refill: 0  3. Change in bowel movement  Explained importance of seeing GI, having accidents at night KUB to rule out constipation with soiling

## 2020-09-24 ENCOUNTER — Ambulatory Visit (INDEPENDENT_AMBULATORY_CARE_PROVIDER_SITE_OTHER): Payer: Medicare Other | Admitting: Family Medicine

## 2020-09-24 ENCOUNTER — Encounter: Payer: Self-pay | Admitting: Family Medicine

## 2020-09-24 ENCOUNTER — Other Ambulatory Visit: Payer: Self-pay

## 2020-09-24 VITALS — BP 134/72 | HR 71 | Temp 98.8°F | Resp 16 | Ht 64.0 in | Wt 138.0 lb

## 2020-09-24 DIAGNOSIS — R350 Frequency of micturition: Secondary | ICD-10-CM

## 2020-09-24 DIAGNOSIS — R198 Other specified symptoms and signs involving the digestive system and abdomen: Secondary | ICD-10-CM

## 2020-09-24 DIAGNOSIS — E1151 Type 2 diabetes mellitus with diabetic peripheral angiopathy without gangrene: Secondary | ICD-10-CM

## 2020-09-24 LAB — POCT URINALYSIS DIPSTICK
Bilirubin, UA: NEGATIVE
Blood, UA: NEGATIVE
Glucose, UA: NEGATIVE
Ketones, UA: NEGATIVE
Leukocytes, UA: NEGATIVE
Nitrite, UA: NEGATIVE
Protein, UA: NEGATIVE
Spec Grav, UA: 1.015 (ref 1.010–1.025)
Urobilinogen, UA: 0.2 E.U./dL
pH, UA: 6.5 (ref 5.0–8.0)

## 2020-09-24 MED ORDER — SITAGLIPTIN PHOSPHATE 100 MG PO TABS
100.0000 mg | ORAL_TABLET | Freq: Every day | ORAL | 0 refills | Status: DC
Start: 2020-09-24 — End: 2020-12-01

## 2020-09-24 MED ORDER — PIOGLITAZONE HCL 15 MG PO TABS
15.0000 mg | ORAL_TABLET | Freq: Every day | ORAL | 0 refills | Status: DC
Start: 2020-09-24 — End: 2021-02-20

## 2020-09-25 ENCOUNTER — Encounter: Payer: Self-pay | Admitting: *Deleted

## 2020-09-26 ENCOUNTER — Other Ambulatory Visit: Payer: Self-pay | Admitting: Family Medicine

## 2020-09-26 DIAGNOSIS — N3000 Acute cystitis without hematuria: Secondary | ICD-10-CM

## 2020-09-26 LAB — CULTURE, URINE COMPREHENSIVE
MICRO NUMBER:: 11838570
SPECIMEN QUALITY:: ADEQUATE

## 2020-09-26 MED ORDER — SULFAMETHOXAZOLE-TRIMETHOPRIM 400-80 MG PO TABS: 1.0000 | ORAL_TABLET | Freq: Two times a day (BID) | ORAL | 0 refills | Status: DC

## 2020-10-13 ENCOUNTER — Other Ambulatory Visit: Payer: Self-pay | Admitting: Family Medicine

## 2020-10-13 DIAGNOSIS — E1121 Type 2 diabetes mellitus with diabetic nephropathy: Secondary | ICD-10-CM

## 2020-10-13 NOTE — Telephone Encounter (Signed)
Requested medication (s) are due for refill today: yes  Requested medication (s) are on the active medication list: yes  Last refill:  06/19/20  Future visit scheduled: yes  Notes to clinic:  overdue lab work    Requested Prescriptions  Pending Prescriptions Disp Refills   losartan (COZAAR) 100 MG tablet [Pharmacy Med Name: LOSARTAN POTASSIUM 100 MG TAB] 90 tablet 0    Sig: TAKE 1 TABLET BY MOUTH EVERY DAY      Cardiovascular:  Angiotensin Receptor Blockers Failed - 10/13/2020  9:37 AM      Failed - Cr in normal range and within 180 days    Creat  Date Value Ref Range Status  02/17/2020 0.89 0.60 - 0.93 mg/dL Final    Comment:    For patients >32 years of age, the reference limit for Creatinine is approximately 13% higher for people identified as African-American. .    Creatinine, Urine  Date Value Ref Range Status  02/17/2020 76 20 - 275 mg/dL Final          Failed - K in normal range and within 180 days    Potassium  Date Value Ref Range Status  02/17/2020 4.0 3.5 - 5.3 mmol/L Final          Passed - Patient is not pregnant      Passed - Last BP in normal range    BP Readings from Last 1 Encounters:  09/24/20 134/72          Passed - Valid encounter within last 6 months    Recent Outpatient Visits           2 weeks ago Urinary frequency   [facility] [place], Danna Hefty, MD   3 months ago DM (diabetes mellitus), type 2 with peripheral vascular complications [facility])   [facility] [place], Danna Hefty, MD   7 months ago DM (diabetes mellitus), type 2 with peripheral vascular complications [facility])   [facility] Alba Cory, MD   10 months ago Hypertensive urgency   [facility] Danelle Berry, PA-C   1 year ago Uncontrolled type 2 diabetes mellitus with complication [facility])   [facility] Alba Cory, MD       Future Appointments             In 4 days Alba Cory, MD [facility]

## 2020-10-15 NOTE — Telephone Encounter (Signed)
Pt has an appt on 10/17/20 

## 2020-10-16 NOTE — Progress Notes (Signed)
Name: Lisa [name]   MRN: [id number]    DOB: [birth year]   Date:10/17/2020       Progress Note  Subjective  Chief Complaint  Follow up   HPI   DMII: last A1C is at goal today , down from 8.8% to 7.2 % she is off Metformin , she is on Actos and Januvia and tolerating it well, but states did not take it this week, explained A1C is at goal and would like for her to take it as prescribed. . She has hypertension, dyslipidemia and history of urine micro, PAD . She is on Losartan, statin therapy. Eye exam is up to date. She has not been checking glucose at home. Denies polyphagia or polydipsia but has polyuria. She has neuropathy and has noticed a change in her balance. She has been drinking glucerna and also more vegetables.   Depression: she states she is getting ready to sell her house and down size to a smaller place, but she would like to rent from now on.   HTN: she states now taking a full pill of Losartan and also HCTZ 12.5 mg daily , she continues to have urinary frequency, we will try stopping hctz and starting norvasc 2.5 mg per day. Denies chest pain or palpitation   Nocturnal enuresis: she has been using pull ups and pads, she states only happens when she sleeps in her bed, but when she sleeping in her chair she does not have nocturnal enuresis, discussed sleep study. She states she snores. She was referred to Urologist , but they advised her to have sleep study first., she states she is not ready to get it done, she states since treated for UTI symptoms improved, but still has urinary frequency and nocturia. We will stop hctz today and recheck urine culture as requested   PAD: she was recently seen by Dr. Lucky Cowboy, 06/2020 and ABI was stable 0.73 on the left  and 0.82 on right.  History of left distal foot amputation, she has phantom pain , she has pain on both legs from PAD, she is up to date with vascular surgeon's follow ups She also has a history of neuropathy and states staggers when  walking , she would like to resume PT    Patient Active Problem List   Diagnosis Date Noted  . Uncontrolled type 2 diabetes with eye complications ([place]) 62/22/9798  . DM (diabetes mellitus), type 2 with peripheral vascular complications ([place]) 92/03/9416  . Amputated toe ([place]) 12/02/2014  . Benign essential HTN 12/02/2014  . Depression, major, recurrent, mild ([place]) 12/02/2014  . Dyslipidemia 12/02/2014  . Vitamin B12 deficiency 12/02/2014  . Peripheral neuropathic pain 12/02/2014  . Peripheral artery disease ([place]) 12/02/2014  . Phantom limb ([place]) 12/02/2014  . Diabetes mellitus with nephropathy ([place]) 12/02/2014  . Detached retina 12/02/2014  . Type 2 diabetes mellitus with peripheral neuropathy ([place]) 12/02/2014  . Tobacco use 12/02/2014  . Urge incontinence 12/02/2014  . 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  . Vitamin D deficiency 12/02/2014    Past Surgical History:  Procedure Laterality Date  . ABDOMINAL HYSTERECTOMY  1992   Total  . EYE SURGERY    . TOE AMPUTATION Left 2011   All of her toes on the left foot  . VASCULAR SURGERY      Family History  Problem Relation Age of Onset  . Heart disease Father   . Diabetes Mother   . Diabetes Brother   . Kidney disease Brother  Transplant  . Heart disease Brother   . Diabetes Sister   . Diabetes Sister     Social History   Tobacco Use  . Smoking status: Current Every Day Smoker    Packs/day: 0.25    Years: 40.00    Pack years: 10.00    Types: Cigarettes    Start date: 03/16/2000  . Smokeless tobacco: Never Used  Substance Use Topics  . Alcohol use: No    Alcohol/week: 0.0 standard drinks     Current Outpatient Medications:  .  aspirin 81 MG tablet, Take 81 mg by mouth daily., Disp: , Rfl:  .  atropine 1 % ophthalmic solution, Place 1 drop into the right eye 3 (three) times daily., Disp: , Rfl:  .  blood glucose meter kit and supplies, , Disp: , Rfl:  .  Blood Glucose Monitoring Suppl (ACCU-CHEK AVIVA CONNECT)  w/Device KIT, 1 each by Does not apply route daily. And supplies E11.9 check fsbs bid, Disp: 1 kit, Rfl: 0 .  brimonidine (ALPHAGAN) 0.2 % ophthalmic solution, USE 1 DROP IN BOTH EYE TWICE A DAY, Disp: , Rfl: 3 .  citalopram (CELEXA) 20 MG tablet, Take 1 tablet (20 mg total) by mouth daily., Disp: 90 tablet, Rfl: 1 .  clopidogrel (PLAVIX) 75 MG tablet, Take 1 tablet (75 mg total) by mouth daily., Disp: 90 tablet, Rfl: 1 .  Cyanocobalamin (B-12) 1000 MCG SUBL, Place 1 tablet under the tongue daily., Disp: 30 tablet, Rfl: 5 .  dorzolamide-timolol (COSOPT) 22.3-6.8 MG/ML ophthalmic solution, USE 1 DROP IN BOTH EYES 2 TIMES A DAY, Disp: , Rfl: 4 .  losartan (COZAAR) 100 MG tablet, TAKE 1 TABLET BY MOUTH EVERY DAY, Disp: 90 tablet, Rfl: 0 .  Multiple Vitamin (MULTIVITAMIN) tablet, Take 1 tablet by mouth daily., Disp: , Rfl:  .  pioglitazone (ACTOS) 15 MG tablet, Take 1 tablet (15 mg total) by mouth daily., Disp: 90 tablet, Rfl: 0 .  prednisoLONE acetate (PRED FORTE) 1 % ophthalmic suspension, USE 1 DROP IN RIGHT EYE 4 TIMES A DAY, Disp: , Rfl: 3 .  rosuvastatin (CRESTOR) 10 MG tablet, Take 1 tablet (10 mg total) by mouth daily., Disp: 90 tablet, Rfl: 1 .  sitaGLIPtin (JANUVIA) 100 MG tablet, Take 1 tablet (100 mg total) by mouth daily., Disp: 90 tablet, Rfl: 0 .  timolol (BETIMOL) 0.5 % ophthalmic solution, 1 drop., Disp: , Rfl:  .  timolol (TIMOPTIC) 0.5 % ophthalmic solution, 1 drop., Disp: , Rfl:  .  triamcinolone ointment (KENALOG) 0.1 %, Apply 1 application topically 2 (two) times daily. Mix with Eucerin at home, Disp: 453.6 g, Rfl: 0 .  Vitamin D, Ergocalciferol, (DRISDOL) 1.25 MG (50000 UNIT) CAPS capsule, Take 1 capsule (50,000 Units total) by mouth every 7 (seven) days., Disp: 12 capsule, Rfl: 1  Allergies  Allergen Reactions  . Other Itching    Nucinta  . Tapentadol Hcl     I personally reviewed active problem list, medication list, allergies, family history, social history, health  maintenance with the patient/caregiver today.   ROS  Constitutional: Negative for fever or weight change.  Respiratory: Negative for cough and shortness of breath.   Cardiovascular: Negative for chest pain or palpitations.  Gastrointestinal: Negative for abdominal pain, no bowel changes.  Musculoskeletal: positive  for gait problem but no joint swelling.  Skin: Negative for rash.  Neurological: Negative for dizziness or headache.  No other specific complaints in a complete review of systems (except as listed in HPI above).  Objective  Vitals:   10/17/20 1431  BP: 138/80  Pulse: 94  Resp: 14  Temp: 97.7 F (36.5 C)  TempSrc: Oral  SpO2: 96%  Weight: 137 lb 3.2 oz (62.2 kg)  Height: _0  (1.626 m)    Body mass index is 23.55 kg/m.  Physical Exam  Constitutional: Patient appears well-developed and well-nourished. No distress.  HEENT: head atraumatic, normocephalic, pupils equal and reactive to light,  neck supple Cardiovascular: Normal rate, regular rhythm and normal heart sounds.  No murmur heard. No BLE edema. Pulmonary/Chest: Effort normal and breath sounds normal. No respiratory distress. Abdominal: Soft.  There is no tenderness. Psychiatric: Patient has a normal mood and affect. behavior is normal. Judgment and thought content normal.   Recent Results (from the past 2160 hour(s))  POCT Urinalysis Dipstick     Status: None   Collection Time: 09/24/20  4:15 PM  Result Value Ref Range   Color, UA Yellow    Clarity, UA Clear    Glucose, UA Negative Negative   Bilirubin, UA Negative    Ketones, UA Negative    Spec Grav, UA 1.015 1.010 - 1.025   Blood, UA Negative    pH, UA 6.5 5.0 - 8.0   Protein, UA Negative Negative   Urobilinogen, UA 0.2 0.2 or 1.0 E.U./dL   Nitrite, UA Negative    Leukocytes, UA Negative Negative   Appearance     Odor    CULTURE, URINE COMPREHENSIVE     Status: Abnormal   Collection Time: 09/24/20  4:18 PM   Specimen: Urine  Result  Value Ref Range   MICRO NUMBER: [id number]    SPECIMEN QUALITY: Adequate    Source OTHER (SPECIFY)    STATUS: FINAL    ISOLATE 1: Escherichia coli (A)     Comment: 10,000-49,000 CFU/mL of Escherichia coli      Susceptibility   Escherichia coli - CULT, URN, SPECIAL NEGATIVE 1    AMOX/CLAVULANIC 4 Sensitive     AMPICILLIN <=2 Sensitive     AMPICILLIN/SULBACTAM <=2 Sensitive     CEFAZOLIN* <=4 Not Reportable      * For infections other than uncomplicated UTIcaused by E. coli, K. pneumoniae or P. mirabilis:Cefazolin is resistant if MIC > or = 8 mcg/mL.(Distinguishing susceptible versus intermediatefor isolates with MIC < or = 4 mcg/mL requiresadditional testing.)For uncomplicated UTI caused by E. coli,K. pneumoniae or P. mirabilis: Cefazolin issusceptible if MIC <32 mcg/mL and predictssusceptible to the oral agents cefaclor, cefdinir,cefpodoxime, cefprozil, cefuroxime, cephalexinand loracarbef.    CEFEPIME <=1 Sensitive     CEFTRIAXONE <=1 Sensitive     CIPROFLOXACIN <=0.25 Sensitive     LEVOFLOXACIN <=0.12 Sensitive     ERTAPENEM <=0.5 Sensitive     GENTAMICIN <=1 Sensitive     IMIPENEM <=0.25 Sensitive     NITROFURANTOIN <=16 Sensitive     PIP/TAZO <=4 Sensitive     TOBRAMYCIN <=1 Sensitive     TRIMETH/SULFA* <=20 Sensitive      * For infections other than uncomplicated UTIcaused by E. coli, K. pneumoniae or P. mirabilis:Cefazolin is resistant if MIC > or = 8 mcg/mL.(Distinguishing susceptible versus intermediatefor isolates with MIC < or = 4 mcg/mL requiresadditional testing.)For uncomplicated UTI caused by E. coli,K. pneumoniae or P. mirabilis: Cefazolin issusceptible if MIC <32 mcg/mL and predictssusceptible to the oral agents cefaclor, cefdinir,cefpodoxime, cefprozil, cefuroxime, cephalexinand loracarbef.Legend:S = Susceptible  I = IntermediateR = Resistant  NS = Not susceptible* = Not tested  NR = Not reported**NN = See antimicrobic  comments  POCT HgB A1C     Status: Abnormal   Collection  Time: 10/17/20  2:35 PM  Result Value Ref Range   Hemoglobin A1C 7.2 (A) 4.0 - 5.6 %   HbA1c POC (<> result, manual entry)     HbA1c, POC (prediabetic range)     HbA1c, POC (controlled diabetic range)       PHQ2/9: Depression screen [facility] 2/9 10/17/2020 09/24/2020 06/19/2020 02/17/2020 02/17/2020  Decreased Interest 1 0 _0 Down, Depressed, Hopeless 1 0 _1 PHQ - 2 Score 2 0 _2 Altered sleeping 0 - 0 2 -  Tired, decreased energy 2 - 1 2 -  Change in appetite 1 - 0 1 -  Feeling bad or failure about yourself  0 - 0 0 -  Trouble concentrating 0 - 0 0 -  Moving slowly or fidgety/restless 0 - 0 0 -  Suicidal thoughts 0 - 0 0 -  PHQ-9 Score 5 - 3 7 -  Difficult doing work/chores Somewhat difficult - Not difficult at all Somewhat difficult -  Some recent data might be hidden    phq 9 is positive   Fall Risk: Fall Risk  10/17/2020 09/24/2020 06/19/2020 02/17/2020 11/30/2019  Falls in the past year? 1 1 0 0 1  Number falls in past yr: 0 0 0 0 1  Comment - - - - -  Injury with Fall? 0 0 0 0 0  Comment - - - - -  Risk for fall due to : History of fall(s) - - - -  Follow up Falls prevention discussed - - - -     Functional Status Survey: Is the patient deaf or have difficulty hearing?: No Does the patient have difficulty seeing, even when wearing glasses/contacts?: Yes Does the patient have difficulty concentrating, remembering, or making decisions?: No Does the patient have difficulty walking or climbing stairs?: Yes Does the patient have difficulty dressing or bathing?: No Does the patient have difficulty doing errands alone such as visiting a doctor's office or shopping?: Yes   Assessment & Plan  1. DM (diabetes mellitus), type 2 with peripheral vascular complications (HCC)  - POCT HgB A1C  2. Hypertension associated with type 2 diabetes mellitus (HCC)  - amLODipine (NORVASC) 2.5 MG tablet; Take 1 tablet (2.5 mg total) by mouth daily. In place of HCTZ for bp  Dispense: 90  tablet; Refill: 0  3. Phantom limb ([place])   4. Vitamin D deficiency   5. Vitamin B12 deficiency  Discussed supplementation   6. Peripheral artery disease ([place])  On statin therapy   7. Dyslipidemia  On statin therapy   8. Change in bowel movement  She has an appointment scheduled with GI  9. Acute cystitis without hematuria  - CULTURE, URINE COMPREHENSIVE  10. Depression, major, recurrent, mild (HCC)  Stable  11. Diabetes mellitus with nephropathy (HCC)  Likely the cause of neuropathy   12. Status post transmetatarsal amputation of foot, left [facility])  Referral PT

## 2020-10-17 ENCOUNTER — Other Ambulatory Visit: Payer: Self-pay

## 2020-10-17 ENCOUNTER — Ambulatory Visit (INDEPENDENT_AMBULATORY_CARE_PROVIDER_SITE_OTHER): Payer: Medicare Other | Admitting: Family Medicine

## 2020-10-17 ENCOUNTER — Encounter: Payer: Self-pay | Admitting: Family Medicine

## 2020-10-17 VITALS — BP 138/80 | HR 94 | Temp 97.7°F | Resp 14 | Ht 64.0 in | Wt 137.2 lb

## 2020-10-17 DIAGNOSIS — E559 Vitamin D deficiency, unspecified: Secondary | ICD-10-CM | POA: Diagnosis not present

## 2020-10-17 DIAGNOSIS — E1151 Type 2 diabetes mellitus with diabetic peripheral angiopathy without gangrene: Secondary | ICD-10-CM

## 2020-10-17 DIAGNOSIS — E1159 Type 2 diabetes mellitus with other circulatory complications: Secondary | ICD-10-CM | POA: Diagnosis not present

## 2020-10-17 DIAGNOSIS — E785 Hyperlipidemia, unspecified: Secondary | ICD-10-CM | POA: Diagnosis not present

## 2020-10-17 DIAGNOSIS — I739 Peripheral vascular disease, unspecified: Secondary | ICD-10-CM

## 2020-10-17 DIAGNOSIS — G547 Phantom limb syndrome without pain: Secondary | ICD-10-CM | POA: Diagnosis not present

## 2020-10-17 DIAGNOSIS — Z89432 Acquired absence of left foot: Secondary | ICD-10-CM

## 2020-10-17 DIAGNOSIS — N3000 Acute cystitis without hematuria: Secondary | ICD-10-CM | POA: Diagnosis not present

## 2020-10-17 DIAGNOSIS — I152 Hypertension secondary to endocrine disorders: Secondary | ICD-10-CM

## 2020-10-17 DIAGNOSIS — R198 Other specified symptoms and signs involving the digestive system and abdomen: Secondary | ICD-10-CM

## 2020-10-17 DIAGNOSIS — E538 Deficiency of other specified B group vitamins: Secondary | ICD-10-CM | POA: Diagnosis not present

## 2020-10-17 DIAGNOSIS — F330 Major depressive disorder, recurrent, mild: Secondary | ICD-10-CM | POA: Diagnosis not present

## 2020-10-17 DIAGNOSIS — R269 Unspecified abnormalities of gait and mobility: Secondary | ICD-10-CM

## 2020-10-17 DIAGNOSIS — E1121 Type 2 diabetes mellitus with diabetic nephropathy: Secondary | ICD-10-CM | POA: Diagnosis not present

## 2020-10-17 LAB — POCT GLYCOSYLATED HEMOGLOBIN (HGB A1C): Hemoglobin A1C: 7.2 % — AB (ref 4.0–5.6)

## 2020-10-17 MED ORDER — AMLODIPINE BESYLATE 2.5 MG PO TABS
2.5000 mg | ORAL_TABLET | Freq: Every day | ORAL | 0 refills | Status: DC
Start: 2020-10-17 — End: 2020-12-31

## 2020-10-19 LAB — CULTURE, URINE COMPREHENSIVE
MICRO NUMBER:: 11936583
RESULT:: NO GROWTH
SPECIMEN QUALITY:: ADEQUATE

## 2020-10-25 ENCOUNTER — Ambulatory Visit (INDEPENDENT_AMBULATORY_CARE_PROVIDER_SITE_OTHER): Payer: Medicare Other | Admitting: Unknown Physician Specialty

## 2020-10-25 ENCOUNTER — Encounter: Payer: Self-pay | Admitting: Unknown Physician Specialty

## 2020-10-25 ENCOUNTER — Ambulatory Visit: Payer: Self-pay | Admitting: *Deleted

## 2020-10-25 ENCOUNTER — Other Ambulatory Visit: Payer: Self-pay

## 2020-10-25 VITALS — BP 132/62 | HR 80 | Temp 99.4°F | Resp 16 | Ht 62.0 in | Wt 142.7 lb

## 2020-10-25 DIAGNOSIS — B029 Zoster without complications: Secondary | ICD-10-CM

## 2020-10-25 DIAGNOSIS — K529 Noninfective gastroenteritis and colitis, unspecified: Secondary | ICD-10-CM

## 2020-10-25 MED ORDER — VALACYCLOVIR HCL 1 G PO TABS: 1000.0000 mg | ORAL_TABLET | Freq: Three times a day (TID) | ORAL | 0 refills | Status: AC

## 2020-10-25 MED ORDER — BETAMETHASONE DIPROPIONATE AUG 0.05 % EX CREA: TOPICAL_CREAM | Freq: Two times a day (BID) | CUTANEOUS | 0 refills | Status: DC

## 2020-10-25 NOTE — Telephone Encounter (Signed)
Patient is calling to request appointment- she is very upset that she has not heard back from the office regarding an appointment. She states she spoke to a nurse yesterday regarding blisters that have appeared on her back- no pain at this time. Advised patient I would be glad to assist her- appointment scheduled so she can be evaluated at office.  Declines triage she states she answered all questions yesterday. See lab note below.

## 2020-10-25 NOTE — Addendum Note (Signed)
Addended by: Gabriel Cirri on: 10/25/2020 04:44 PM   Modules accepted: Orders

## 2020-10-25 NOTE — Progress Notes (Addendum)
BP 132/62   Pulse 80   Temp 99.4 F (37.4 C)   Resp 16   Ht 5\' 2"  (1.575 m)   Wt 142 lb 11.2 oz (64.7 kg)   SpO2 97%   BMI 26.10 kg/m    Subjective:    Patient ID: , female    DOB: 28-Jun-1945, 75 y.o.   MRN: [id number]  HPI: Lisa [name] is a 75 y.o. female  Chief Complaint  Patient presents with  . Rash    Blisters on back possible shingles or new med reaction? Sowles started her on 61 and actos?   Pt with a 2 day history of blistering lesions on left lower back.  She states she is under a lot of stress with intermittent fatigue.  She thinks she received her shingles vaccines.  Denies pain and says she has some itching.  Wearing pull-ups at night and is concerned about stool leakage causing the problem.    Relevant past medical, surgical, family and social history reviewed and updated as indicated. Interim medical history since our last visit reviewed. Allergies and medications reviewed and updated.  Review of Systems  Constitutional: Positive for fatigue.  Gastrointestinal:       Stool incontinence  Genitourinary:       Urine incontinence    Per HPI unless specifically indicated above     Objective:    BP 132/62   Pulse 80   Temp 99.4 F (37.4 C)   Resp 16   Ht 5\' 2"  (1.575 m)   Wt 142 lb 11.2 oz (64.7 kg)   SpO2 97%   BMI 26.10 kg/m   Wt Readings from Last 3 Encounters:  10/25/20 142 lb 11.2 oz (64.7 kg)  10/17/20 137 lb 3.2 oz (62.2 kg)  09/24/20 138 lb (62.6 kg)    Physical Exam Constitutional:      General: She is not in acute distress.    Appearance: Normal appearance. She is well-developed.  HENT:     Head: Normocephalic and atraumatic.  Eyes:     General: Lids are normal. No scleral icterus.       Right eye: No discharge.        Left eye: No discharge.     Conjunctiva/sclera: Conjunctivae normal.  Cardiovascular:     Rate and Rhythm: Normal rate.  Pulmonary:     Effort: Pulmonary effort is normal.  Abdominal:      Palpations: There is no hepatomegaly or splenomegaly.  Musculoskeletal:        General: Normal range of motion.  Skin:    Coloration: Skin is not pale.     Findings: Rash present.     Comments: Blistering lesions left lower back.  Does cross midline but mostly on left side  Neurological:     Mental Status: She is alert and oriented to person, place, and time.  Psychiatric:        Behavior: Behavior normal.        Thought Content: Thought content normal.        Judgment: Judgment normal.     Results for orders placed or performed in visit on 10/17/20  CULTURE, URINE COMPREHENSIVE   Specimen: Urine  Result Value Ref Range   MICRO NUMBER: 11/24/20    SPECIMEN QUALITY: Adequate    Source OTHER (SPECIFY)    STATUS: FINAL    RESULT: No Growth   POCT HgB A1C  Result Value Ref Range   Hemoglobin A1C 7.2 (A) 4.0 -  5.6 %   HbA1c POC (<> result, manual entry)     HbA1c, POC (prediabetic range)     HbA1c, POC (controlled diabetic range)        Assessment & Plan:   Problem List Items Addressed This Visit   None   Visit Diagnoses    Herpes zoster without complication    -  Primary   Not classic as it does cross slightly midline but in a dermatomal distribution. Rx for Valtrex TID x 7 days and steroid cream BID   Relevant Medications   valACYclovir (VALTREX) 1000 MG tablet   Chronic diarrhea       Will check for celiac       Follow up plan: Return if symptoms worsen or fail to improve.

## 2020-10-25 NOTE — Telephone Encounter (Signed)
10/24/2020 4:33 PM EDT Back to Top     Pt given lab results per notes of Dr. Carlynn Purl from 10/22/20 on 10/24/20. Pt verbalized understanding and reported she is having some pain in her low back and reports she has noticed "whelps" on her lower back and top of buttocks, skin peeling off and clear , sticky drainage noted. "whelps" have been spreading since this am. Reports whelps localized to low back at this time. Denies fever, chest pain, shortness of breath or difficulty breathing. Denies itching or redness. Attempted to make appt for patient. Patient requesting to see if PCP wants her to make appt. Care advise given , if symptoms such as fever, whelps spread, redness, pain , shortness of breath noted to go to UC or ED. Patient reports she does not want to go to ED . Patient verbalized understanding of care advise and to call back or got to [facility] or ED if symptoms worsen. Please advise .

## 2020-10-25 NOTE — Addendum Note (Signed)
Addended by: Gabriel Cirri on: 10/25/2020 04:49 PM   Modules accepted: Orders

## 2020-10-26 LAB — CELIAC DISEASE PANEL
(tTG) Ab, IgA: <1 U/mL
(tTG) Ab, IgG: <1 U/mL
Gliadin IgA: <1 U/mL
Gliadin IgG: <1 U/mL
Immunoglobulin A: 224 mg/dL (ref 70–320)

## 2020-11-12 ENCOUNTER — Encounter: Payer: Self-pay | Admitting: *Deleted

## 2020-11-12 ENCOUNTER — Ambulatory Visit: Payer: 59 | Admitting: Gastroenterology

## 2020-11-15 ENCOUNTER — Other Ambulatory Visit: Payer: Self-pay | Admitting: Family Medicine

## 2020-11-15 DIAGNOSIS — E559 Vitamin D deficiency, unspecified: Secondary | ICD-10-CM

## 2020-11-15 NOTE — Telephone Encounter (Signed)
  Notes to clinic: 50,000 IU strengths are not delegated    Requested Prescriptions  Pending Prescriptions Disp Refills   Vitamin D, Ergocalciferol, (DRISDOL) 1.25 MG (50000 UNIT) CAPS capsule [Pharmacy Med Name: VITAMIN D2 1.25MG (50,000 UNIT)] 8 capsule 1    Sig: Take 1 capsule (50,000 Units total) by mouth every 7 (seven) days.      Endocrinology:  Vitamins - Vitamin D Supplementation Failed - 11/15/2020  3:23 PM      Failed - 50,000 IU strengths are not delegated      Failed - Phosphate in normal range and within 360 days    No results found for: PHOS        Passed - Ca in normal range and within 360 days    Calcium  Date Value Ref Range Status  02/17/2020 9.4 8.6 - 10.4 mg/dL Final          Passed - Vitamin D in normal range and within 360 days    Vit D, 25-Hydroxy  Date Value Ref Range Status  02/17/2020 68 30 - 100 ng/mL Final    Comment:    Vitamin D Status         25-OH Vitamin D: . Deficiency:                    <20 ng/mL Insufficiency:             20 - 29 ng/mL Optimal:                 > or = 30 ng/mL . For 25-OH Vitamin D testing on patients on  D2-supplementation and patients for whom quantitation  of D2 and D3 fractions is required, the [employer](TM) 25-OH VIT D, (D2,D3), LC/MS/MS is recommended: order  code 79024 (patients >47yrs). See Note 1 . Note 1 . For additional information, please refer to  http://education.Quest[facility].com/faq/FAQ199  (This link is being provided for informational/ educational purposes only.)           Passed - Valid encounter within last 12 months    Recent Outpatient Visits           3 weeks ago Herpes zoster without complication   [facility], Elnita Maxwell, NP   4 weeks ago DM (diabetes mellitus), type 2 with peripheral vascular complications [facility])   [facility] Alba Cory, MD   1 month ago Urinary frequency   [facility], Danna Hefty, MD    4 months ago DM (diabetes mellitus), type 2 with peripheral vascular complications [facility])   [facility] Alba Cory, MD   9 months ago DM (diabetes mellitus), type 2 with peripheral vascular complications [facility])   [facility] Alba Cory, MD       Future Appointments             In 3 months Carlynn Purl, Danna Hefty, MD [facility]

## 2020-11-15 NOTE — Telephone Encounter (Signed)
Last appt 5-25-and 10-25-2020. Followup on 02-20-2021

## 2020-11-19 ENCOUNTER — Ambulatory Visit: Payer: 59 | Admitting: Gastroenterology

## 2020-12-01 ENCOUNTER — Other Ambulatory Visit: Payer: Self-pay | Admitting: Family Medicine

## 2020-12-01 DIAGNOSIS — E1151 Type 2 diabetes mellitus with diabetic peripheral angiopathy without gangrene: Secondary | ICD-10-CM

## 2020-12-01 NOTE — Telephone Encounter (Signed)
Future visit in 2 months  

## 2020-12-28 ENCOUNTER — Telehealth: Payer: Self-pay | Admitting: Family Medicine

## 2020-12-28 NOTE — Telephone Encounter (Signed)
Copied from CRM [id number]. Topic: Medicare AWV >> Dec 28, 2020  3:24 PM Claudette Laws R wrote: Reason for CRM:  Left message for patient to call back and schedule Medicare Annual Wellness Visit (AWV) in office.   If unable to come into the office for AWV,  please offer to do virtually or by telephone.  Last AWV:  07/20/2018  Please schedule at anytime with [facility] Health Advisor.  40 minute appointment  Any questions, please contact me at [phone]

## 2020-12-31 ENCOUNTER — Other Ambulatory Visit: Payer: Self-pay | Admitting: Family Medicine

## 2020-12-31 DIAGNOSIS — E1159 Type 2 diabetes mellitus with other circulatory complications: Secondary | ICD-10-CM

## 2021-01-11 ENCOUNTER — Telehealth: Payer: Self-pay | Admitting: Family Medicine

## 2021-01-11 NOTE — Telephone Encounter (Signed)
Copied from CRM [id number]. Topic: Medicare AWV >> Jan 11, 2021  1:08 PM Claudette Laws R wrote: Reason for CRM:  Left message for patient to call back and schedule Medicare Annual Wellness Visit (AWV) in office.   If unable to come into the office for AWV,  please offer to do virtually or by telephone.  Last AWV: 07/20/2018  Please schedule at anytime with [facility] Health Advisor.  40 minute appointment  Any questions, please contact me at [phone]

## 2021-01-18 ENCOUNTER — Other Ambulatory Visit: Payer: Self-pay | Admitting: Family Medicine

## 2021-01-18 DIAGNOSIS — E1121 Type 2 diabetes mellitus with diabetic nephropathy: Secondary | ICD-10-CM

## 2021-01-18 DIAGNOSIS — I739 Peripheral vascular disease, unspecified: Secondary | ICD-10-CM

## 2021-01-18 DIAGNOSIS — F330 Major depressive disorder, recurrent, mild: Secondary | ICD-10-CM

## 2021-01-22 ENCOUNTER — Encounter (INDEPENDENT_AMBULATORY_CARE_PROVIDER_SITE_OTHER): Payer: Medicare Other

## 2021-01-22 ENCOUNTER — Ambulatory Visit (INDEPENDENT_AMBULATORY_CARE_PROVIDER_SITE_OTHER): Payer: Medicare Other | Admitting: Vascular Surgery

## 2021-01-22 ENCOUNTER — Ambulatory Visit (INDEPENDENT_AMBULATORY_CARE_PROVIDER_SITE_OTHER): Payer: Medicare Other | Admitting: Gastroenterology

## 2021-01-22 ENCOUNTER — Other Ambulatory Visit: Payer: Self-pay

## 2021-01-22 VITALS — BP 181/68 | HR 57 | Temp 97.8°F | Ht 64.0 in | Wt 130.6 lb

## 2021-01-22 DIAGNOSIS — D509 Iron deficiency anemia, unspecified: Secondary | ICD-10-CM | POA: Diagnosis not present

## 2021-01-22 DIAGNOSIS — R159 Full incontinence of feces: Secondary | ICD-10-CM | POA: Diagnosis not present

## 2021-01-22 DIAGNOSIS — Z1211 Encounter for screening for malignant neoplasm of colon: Secondary | ICD-10-CM | POA: Diagnosis not present

## 2021-01-22 DIAGNOSIS — R197 Diarrhea, unspecified: Secondary | ICD-10-CM | POA: Diagnosis not present

## 2021-01-22 MED ORDER — NA SULFATE-K SULFATE-MG SULF 17.5-3.13-1.6 GM/177ML PO SOLN: 1.0000 | Freq: Once | ORAL | 0 refills | Status: AC

## 2021-01-22 NOTE — Progress Notes (Signed)
Lisa [name] [address]  [place]  [place], [place] [address]  Main: [phone]  Fax: [phone]   Gastroenterology Consultation  Referring Provider:     Steele Sizer, MD Primary Care Physician:  Steele Sizer, MD Reason for Consultation:     Altered bowel habits        HPI:    Chief complaint: Incontinence  Lisa [name] is a 75 y.o. y/o female referred for consultation & management  by Dr. Ancil Boozer, Lisa Stager, MD. patient reports 3 to 62-monthhistory of fecal incontinence.  Also reports chronic history of urinary incontinence.  Reports seeing a urologist for urinary incontinence in the past.  States has ruined her clothes and had to throw away clothes due to fecal incontinence and this is really troubling her.  Reports having episodes 3-4 times a day, and does not like being outside the home as she never knows when she will have an accident.  No prior colonoscopies.  No screening colonoscopies in the past.  No dysphagia.  No abdominal pain.  No nausea or vomiting.  No weight loss.  PCP notes reviewed and notes that patient had nocturnal enuresis, and urologist recommended a sleep study the patient has not had done yet.  Celiac disease panel was ordered by PCP and was negative  Past Medical History:  Diagnosis Date   Blindness of right eye    Depression    Diabetes mellitus without complication [place] [place]    [place] [place]    Glaucoma    Hyperlipidemia    Hypertension    Vascular disease     Past Surgical History:  Procedure Laterality Date   ABDOMINAL HYSTERECTOMY  1992   Total   EYE SURGERY     TOE AMPUTATION Left 2011   All of her toes on the left foot   VASCULAR SURGERY      Prior to Admission medications   Medication Sig Start Date End Date Taking? Authorizing Provider  aspirin 81 MG tablet Take 81 mg by mouth daily.   Yes [provider]  atropine 1 % ophthalmic solution Place 1 drop into the right eye 3  (three) times daily.   Yes [provider]  augmented betamethasone dipropionate (DIPROLENE AF) 0.05 % cream Apply topically 2 (two) times daily. 10/25/20  Yes WKathrine Haddock NP  blood glucose meter kit and supplies  09/01/13  Yes [provider]  Blood Glucose Monitoring Suppl (ACCU-CHEK AVIVA CONNECT) w/Device KIT 1 each by Does not apply route daily. And supplies E11.9 check fsbs bid 07/20/18  Yes Sowles, KDrue Stager MD  brimonidine (ALPHAGAN) 0.2 % ophthalmic solution USE 1 DROP IN BOTH EYE TWICE A DAY 09/30/16  Yes [provider]  citalopram (CELEXA) 20 MG tablet TAKE 1 TABLET BY MOUTH EVERY DAY 01/18/21  Yes Sowles, KDrue Stager MD  clopidogrel (PLAVIX) 75 MG tablet TAKE 1 TABLET BY MOUTH EVERY DAY 01/18/21  Yes Sowles, KDrue Stager MD  Cyanocobalamin (B-12) 1000 MCG SUBL Place 1 tablet under the tongue daily. 08/16/19  Yes Sowles, KDrue Stager MD  dorzolamide-timolol (COSOPT) 22.3-6.8 MG/ML ophthalmic solution USE 1 DROP IN BOTH EYES 2 TIMES A DAY 09/22/16  Yes [provider]  hydrochlorothiazide (HYDRODIURIL) 12.5 MG tablet Take 12.5 mg by mouth daily. 11/04/20  Yes [provider]  JANUVIA 100 MG tablet TAKE 1 TABLET BY MOUTH EVERY DAY 12/01/20  Yes Sowles, KDrue Stager MD  losartan (COZAAR) 100 MG tablet TAKE 1 TABLET BY MOUTH EVERY DAY 01/18/21  Yes Steele Sizer, MD  Multiple Vitamin (MULTIVITAMIN) tablet Take 1 tablet by mouth daily.   Yes [provider]  Na Sulfate-K Sulfate-Mg Sulf 17.5-3.13-1.6 GM/177ML SOLN Take 1 kit by mouth once for 1 dose. 01/22/21 01/22/21 Yes Lisa [name] B, MD  pioglitazone (ACTOS) 15 MG tablet Take 1 tablet (15 mg total) by mouth daily. 09/24/20  Yes Sowles, Lisa Stager, MD  prednisoLONE acetate (PRED FORTE) 1 % ophthalmic suspension USE 1 DROP IN RIGHT EYE 4 TIMES A DAY 07/27/15  Yes [provider]  rosuvastatin (CRESTOR) 10 MG tablet Take 1 tablet (10 mg total) by mouth daily. 06/19/20  Yes Sowles, Lisa Stager, MD  timolol  (BETIMOL) 0.5 % ophthalmic solution 1 drop. 01/06/07  Yes [provider]  timolol (TIMOPTIC) 0.5 % ophthalmic solution 1 drop. 01/06/07  Yes [provider]  triamcinolone ointment (KENALOG) 0.1 % Apply 1 application topically 2 (two) times daily. Mix with Eucerin at home 06/19/20  Yes Sowles, Lisa Stager, MD  Vitamin D, Ergocalciferol, (DRISDOL) 1.25 MG (50000 UNIT) CAPS capsule TAKE 1 CAPSULE (50,000 UNITS TOTAL) BY MOUTH EVERY 7 (SEVEN) DAYS. 11/16/20  Yes Sowles, Lisa Stager, MD  amLODipine (NORVASC) 2.5 MG tablet TAKE 1 TABLET (2.5 MG TOTAL) BY MOUTH DAILY. IN PLACE OF HYDROCHLOROTHIAZIDE FOR BP. Patient not taking: Reported on 01/22/2021 12/31/20   Steele Sizer, MD    Family History  Problem Relation Age of Onset   Heart disease Father    Diabetes Mother    Diabetes Brother    Kidney disease Brother        Transplant   Heart disease Brother    Diabetes Sister    Diabetes Sister      Social History   Tobacco Use   Smoking status: Every Day    Packs/day: 0.25    Years: 40.00    Pack years: 10.00    Types: Cigarettes    Start date: 03/16/2000   Smokeless tobacco: Never  Vaping Use   Vaping Use: Never used  Substance Use Topics   Alcohol use: No    Alcohol/week: 0.0 standard drinks   Drug use: No    Allergies as of 01/22/2021 - Review Complete 01/22/2021  Allergen Reaction Noted   Other Itching 07/27/2014   Tapentadol hcl  04/07/2018    Review of Systems:    All systems reviewed and negative except where noted in HPI.   Physical Exam:  Constitutional: General:   Alert,  Well-developed, well-nourished, pleasant and cooperative in NAD BP (!) 181/68   Pulse (!) 57   Temp 97.8 F (36.6 C) (Oral)   Ht 5' 4" (1.626 m)   Wt 130 lb 9.6 oz (59.2 kg)   BMI 22.42 kg/m   Eyes:  Sclera clear, no icterus.   Conjunctiva pink. PERRLA  Ears:  No scars, lesions or masses, Normal auditory acuity. Nose:  No deformity, discharge, or lesions. Mouth:  No deformity or  lesions, oropharynx pink & moist.  Neck:  Supple; no masses or thyromegaly.  Respiratory: Normal respiratory effort, Normal percussion  Gastrointestinal: Soft, non-tender and non-distended without masses, hepatosplenomegaly or hernias noted.  No guarding or rebound tenderness.     Cardiac: No clubbing or edema.  No cyanosis. Normal posterior tibial pedal pulses noted.  Lymphatic:  No significant cervical or axillary adenopathy.  Psych:  Alert and cooperative. Normal mood and affect.  Musculoskeletal:  Normal gait. Head normocephalic, atraumatic. Symmetrical without gross deformities. 5/5 Upper and Lower extremity strength bilaterally.  Skin: Warm. Intact without  significant lesions or rashes. No jaundice.  Neurologic:  Face symmetrical, tongue midline, Normal sensation to touch;  grossly normal neurologically.  Psych:  Alert and oriented x3, Alert and cooperative. Normal mood and affect.   Labs: CBC    Component Value Date/Time   WBC 5.7 02/17/2020 1456   RBC 4.60 02/17/2020 1456   HGB 12.1 02/17/2020 1456   HCT 38.2 02/17/2020 1456   PLT 299 02/17/2020 1456   MCV 83.0 02/17/2020 1456   MCH 26.3 (L) 02/17/2020 1456   MCHC 31.7 (L) 02/17/2020 1456   RDW 17.3 (H) 02/17/2020 1456   LYMPHSABS 1,676 02/17/2020 1456   MONOABS 0.4 11/30/2019 1554   EOSABS 29 02/17/2020 1456   BASOSABS 23 02/17/2020 1456   CMP     Component Value Date/Time   NA 140 02/17/2020 1456   NA 142 09/25/2015 1620   K 4.0 02/17/2020 1456   CL 105 02/17/2020 1456   CO2 29 02/17/2020 1456   GLUCOSE 109 (H) 02/17/2020 1456   BUN 8 02/17/2020 1456   BUN 8 09/25/2015 1620   CREATININE 0.89 02/17/2020 1456   CALCIUM 9.4 02/17/2020 1456   PROT 6.5 02/17/2020 1456   PROT 6.8 09/25/2015 1620   ALBUMIN 3.9 10/14/2016 1613   ALBUMIN 4.4 09/25/2015 1620   AST 18 02/17/2020 1456   ALT 14 02/17/2020 1456   ALKPHOS 82 10/14/2016 1613   BILITOT 0.6 02/17/2020 1456   BILITOT 0.4 09/25/2015 1620    GFRNONAA 64 02/17/2020 1456   GFRAA 74 02/17/2020 1456    Imaging Studies: No results found.  Assessment and Plan:   Aleda Madl is a 75 y.o. y/o female has been referred for diarrhea  Given the patient is reporting both urinary and bowel incontinence, she may have pelvic floor muscle dysfunction  Patient agreeable to pelvic floor physical therapy referral.  We will place  We also discussed stool testing to evaluate for pancreatic insufficiency, but patient states she does not want to collect stool and therefore refuses this test at this time  She is agreeable for screening colonoscopy which were also about allow [place] to evaluate for microscopic colitis  Celiac disease panel was normal in June 2022.  Ferritin was normal at 28, but low iron saturation was noted at 12 in September 2021.  Most recent CBC shows normal hemoglobin, however, hemoglobin was mildly low at 11.8 a year ago.  We will rule out malignancy during colonoscopy.  If future labs continue to show iron deficiency, may need to consider EGD if colonoscopy is negative  Dr Lisa [name]  Speech recognition software was used to dictate the above note.

## 2021-01-24 ENCOUNTER — Telehealth: Payer: Self-pay

## 2021-01-24 NOTE — Telephone Encounter (Signed)
Blood thinner request faxed to Dr Carlynn Purl PCP

## 2021-01-24 NOTE — Addendum Note (Signed)
Addended by: Roena Malady on: 01/24/2021 09:02 AM   Modules accepted: Orders

## 2021-01-29 NOTE — Telephone Encounter (Signed)
Received fax from Dr. Carlynn Purl in reference to blood thinner. Patient should stop Plavix 3 days prior to procedure and restart 1 day after procedure

## 2021-02-01 NOTE — Telephone Encounter (Signed)
Letter mailed to pt with information in reference to Plavix prescription per Dr Carlynn Purl

## 2021-02-09 ENCOUNTER — Other Ambulatory Visit: Payer: Self-pay | Admitting: Family Medicine

## 2021-02-09 DIAGNOSIS — E785 Hyperlipidemia, unspecified: Secondary | ICD-10-CM

## 2021-02-09 DIAGNOSIS — E559 Vitamin D deficiency, unspecified: Secondary | ICD-10-CM

## 2021-02-09 DIAGNOSIS — I10 Essential (primary) hypertension: Secondary | ICD-10-CM

## 2021-02-09 DIAGNOSIS — I739 Peripheral vascular disease, unspecified: Secondary | ICD-10-CM

## 2021-02-15 ENCOUNTER — Other Ambulatory Visit: Payer: Self-pay

## 2021-02-15 ENCOUNTER — Encounter (INDEPENDENT_AMBULATORY_CARE_PROVIDER_SITE_OTHER): Payer: Self-pay | Admitting: Vascular Surgery

## 2021-02-15 ENCOUNTER — Ambulatory Visit (INDEPENDENT_AMBULATORY_CARE_PROVIDER_SITE_OTHER): Payer: Medicare Other

## 2021-02-15 ENCOUNTER — Ambulatory Visit (INDEPENDENT_AMBULATORY_CARE_PROVIDER_SITE_OTHER): Payer: Medicare Other | Admitting: Vascular Surgery

## 2021-02-15 VITALS — BP 150/70 | HR 62 | Resp 16 | Wt 141.4 lb

## 2021-02-15 DIAGNOSIS — E785 Hyperlipidemia, unspecified: Secondary | ICD-10-CM

## 2021-02-15 DIAGNOSIS — I10 Essential (primary) hypertension: Secondary | ICD-10-CM

## 2021-02-15 DIAGNOSIS — I739 Peripheral vascular disease, unspecified: Secondary | ICD-10-CM | POA: Diagnosis not present

## 2021-02-15 NOTE — Progress Notes (Signed)
MRN : [id number]  Lisa [name] is a 75 y.o. ([birth year]) female who presents with chief complaint of  Chief Complaint  Patient presents with   Follow-up    Ultrasound follow up  .  History of Present Illness: Patient returns today in follow up of her PAD.  Her biggest complaint currently is of swelling.  She is not as active as she used to be and is not really walking much.  She denies any new ulcers or infection.  No fevers or chills.  She has been having more incontinence issues and is having a work-up.  She is scheduled to see her primary care provider next week.  Current Outpatient Medications  Medication Sig Dispense Refill   aspirin 81 MG tablet Take 81 mg by mouth daily.     atropine 1 % ophthalmic solution Place 1 drop into the right eye 3 (three) times daily.     augmented betamethasone dipropionate (DIPROLENE AF) 0.05 % cream Apply topically 2 (two) times daily. 30 g 0   blood glucose meter kit and supplies      Blood Glucose Monitoring Suppl (ACCU-CHEK AVIVA CONNECT) w/Device KIT 1 each by Does not apply route daily. And supplies E11.9 check fsbs bid 1 kit 0   brimonidine (ALPHAGAN) 0.2 % ophthalmic solution USE 1 DROP IN BOTH EYE TWICE A DAY  3   citalopram (CELEXA) 20 MG tablet TAKE 1 TABLET BY MOUTH EVERY DAY 90 tablet 0   clopidogrel (PLAVIX) 75 MG tablet TAKE 1 TABLET BY MOUTH EVERY DAY 90 tablet 0   Cyanocobalamin (B-12) 1000 MCG SUBL Place 1 tablet under the tongue daily. 30 tablet 5   hydrochlorothiazide (HYDRODIURIL) 12.5 MG tablet Take 12.5 mg by mouth daily.     JANUVIA 100 MG tablet TAKE 1 TABLET BY MOUTH EVERY DAY 90 tablet 0   losartan (COZAAR) 100 MG tablet TAKE 1 TABLET BY MOUTH EVERY DAY 90 tablet 0   Multiple Vitamin (MULTIVITAMIN) tablet Take 1 tablet by mouth daily.     pioglitazone (ACTOS) 15 MG tablet Take 1 tablet (15 mg total) by mouth daily. 90 tablet 0   prednisoLONE acetate (PRED FORTE) 1 % ophthalmic suspension USE 1 DROP IN RIGHT EYE 4 TIMES A  DAY  3   rosuvastatin (CRESTOR) 10 MG tablet TAKE 1 TABLET BY MOUTH EVERY DAY 30 tablet 0   timolol (BETIMOL) 0.5 % ophthalmic solution 1 drop.     timolol (TIMOPTIC) 0.5 % ophthalmic solution 1 drop.     triamcinolone ointment (KENALOG) 0.1 % Apply 1 application topically 2 (two) times daily. Mix with Eucerin at home 453.6 g 0   Vitamin D, Ergocalciferol, (DRISDOL) 1.25 MG (50000 UNIT) CAPS capsule TAKE 1 CAPSULE (50,000 UNITS TOTAL) BY MOUTH EVERY 7 (SEVEN) DAYS. 8 capsule 1   amLODipine (NORVASC) 2.5 MG tablet TAKE 1 TABLET (2.5 MG TOTAL) BY MOUTH DAILY. IN PLACE OF HYDROCHLOROTHIAZIDE FOR BP. (Patient not taking: No sig reported) 90 tablet 0   dorzolamide-timolol (COSOPT) 22.3-6.8 MG/ML ophthalmic solution USE 1 DROP IN BOTH EYES 2 TIMES A DAY (Patient not taking: Reported on 02/15/2021)  4   No current facility-administered medications for this visit.    Past Medical History:  Diagnosis Date   Blindness of right eye    Depression    Diabetes mellitus without complication (HCC)    DVT of leg (deep venous thrombosis) (HCC)    Glaucoma    Hyperlipidemia    Hypertension    Vascular  disease     Past Surgical History:  Procedure Laterality Date   ABDOMINAL HYSTERECTOMY  1992   Total   EYE SURGERY     TOE AMPUTATION Left 2011   All of her toes on the left foot   VASCULAR SURGERY       Social History   Tobacco Use   Smoking status: Every Day    Packs/day: 0.25    Years: 40.00    Pack years: 10.00    Types: Cigarettes    Start date: 03/16/2000   Smokeless tobacco: Never  Vaping Use   Vaping Use: Never used  Substance Use Topics   Alcohol use: No    Alcohol/week: 0.0 standard drinks   Drug use: No      Family History  Problem Relation Age of Onset   Heart disease Father    Diabetes Mother    Diabetes Brother    Kidney disease Brother        Transplant   Heart disease Brother    Diabetes Sister    Diabetes Sister      Allergies  Allergen Reactions    Other Itching    Nucinta   Tapentadol Hcl     REVIEW OF SYSTEMS (Negative unless checked)   Constitutional: [] Weight loss  [] Fever  [] Chills Cardiac: [] Chest pain   [] Chest pressure   [] Palpitations   [] Shortness of breath when laying flat   [] Shortness of breath at rest   [] Shortness of breath with exertion. Vascular:  [x] Pain in legs with walking   [x] Pain in legs at rest   [] Pain in legs when laying flat   [] Claudication   [] Pain in feet when walking  [] Pain in feet at rest  [] Pain in feet when laying flat   [] History of DVT   [] Phlebitis   [] Swelling in legs   [] Varicose veins   [] Non-healing ulcers Pulmonary:   [] Uses home oxygen   [] Productive cough   [] Hemoptysis   [] Wheeze  [] COPD   [] Asthma Neurologic:  [] Dizziness  [] Blackouts   [] Seizures   [] History of stroke   [] History of TIA  [] Aphasia   [] Temporary blindness   [] Dysphagia   [] Weakness or numbness in arms   [] Weakness or numbness in legs Musculoskeletal:  [x] Arthritis   [] Joint swelling   [x] Joint pain   [] Low back pain Hematologic:  [] Easy bruising  [] Easy bleeding   [] Hypercoagulable state   [] Anemic   Gastrointestinal:  [] Blood in stool   [] Vomiting blood  [] Gastroesophageal reflux/heartburn   [] Abdominal pain Genitourinary:  [] Chronic kidney disease   [] Difficult urination  [] Frequent urination  [] Burning with urination   [] Hematuria Skin:  [] Rashes   [] Ulcers   [] Wounds Psychological:  [] History of anxiety   []  History of major depression.  Physical Examination  BP (!) 150/70 (BP Location: Right Arm)   Pulse 62   Resp 16   Wt 141 lb 6.4 oz (64.1 kg)   BMI 24.27 kg/m  Gen:  WD/WN, NAD Head: [place]/AT, No temporalis wasting. Ear/Nose/Throat: Hearing grossly intact, nares w/o erythema or drainage Eyes: Conjunctiva clear. Sclera non-icteric Neck: Supple.  Trachea midline Pulmonary:  Good air movement, no use of accessory muscles.  Cardiac: RRR, no JVD Vascular:  Vessel Right Left  Radial Palpable Palpable                           PT 1+ Palpable 1+ Palpable  DP 1+ Palpable 2+ Palpable   Gastrointestinal: soft,  non-tender/non-distended. No guarding/reflex.  Musculoskeletal: M/S 5/5 throughout.  No deformity or atrophy. 1+ BLE edema. Neurologic: Sensation grossly intact in extremities.  Symmetrical.  Speech is fluent.  Psychiatric: Judgment intact, Mood & affect appropriate for pt's clinical situation. Dermatologic: No rashes or ulcers noted.  No cellulitis or open wounds.      Labs No results found for this or any previous visit (from the past 2160 hour(s)).  Radiology No results found.  Assessment/Plan Benign essential HTN blood pressure control important in reducing the progression of atherosclerotic disease. On appropriate oral medications.     DM (diabetes mellitus), type 2 with peripheral vascular complications (HCC) blood glucose control important in reducing the progression of atherosclerotic disease. Also, involved in wound healing. On appropriate medications.     Dyslipidemia lipid control important in reducing the progression of atherosclerotic disease. Continue statin therapy  Peripheral artery disease ABIs today are 0.67 on the right and 1.1 on the left.  Digit pressures are actually better on the right today than last time.  She is having more swelling with this is not related to arterial insufficiency.  This may be more from her immobility, dependent legs, and medical issues such as renal dysfunction.  She is seeing her primary care provider next week.  I recommend she wear compression socks daily and elevate her legs as well as increasing her activity.  Recheck ABIs in 6 months.    Leotis Pain, MD  02/15/2021 12:03 PM    This note was created with Dragon medical transcription system.  Any errors from dictation are purely unintentional

## 2021-02-15 NOTE — Assessment & Plan Note (Signed)
ABIs today are 0.67 on the right and 1.1 on the left.  Digit pressures are actually better on the right today than last time.  She is having more swelling with this is not related to arterial insufficiency.  This may be more from her immobility, dependent legs, and medical issues such as renal dysfunction.  She is seeing her primary care provider next week.  I recommend she wear compression socks daily and elevate her legs as well as increasing her activity.  Recheck ABIs in 6 months.

## 2021-02-20 ENCOUNTER — Encounter: Payer: Self-pay | Admitting: Family Medicine

## 2021-02-20 ENCOUNTER — Other Ambulatory Visit: Payer: Self-pay

## 2021-02-20 ENCOUNTER — Ambulatory Visit (INDEPENDENT_AMBULATORY_CARE_PROVIDER_SITE_OTHER): Payer: Medicare Other | Admitting: Family Medicine

## 2021-02-20 VITALS — BP 136/74 | HR 87 | Temp 98.0°F | Resp 16 | Ht 64.0 in | Wt 137.0 lb

## 2021-02-20 DIAGNOSIS — E876 Hypokalemia: Secondary | ICD-10-CM | POA: Diagnosis not present

## 2021-02-20 DIAGNOSIS — G547 Phantom limb syndrome without pain: Secondary | ICD-10-CM

## 2021-02-20 DIAGNOSIS — I152 Hypertension secondary to endocrine disorders: Secondary | ICD-10-CM

## 2021-02-20 DIAGNOSIS — E559 Vitamin D deficiency, unspecified: Secondary | ICD-10-CM

## 2021-02-20 DIAGNOSIS — E1159 Type 2 diabetes mellitus with other circulatory complications: Secondary | ICD-10-CM

## 2021-02-20 DIAGNOSIS — E785 Hyperlipidemia, unspecified: Secondary | ICD-10-CM

## 2021-02-20 DIAGNOSIS — I739 Peripheral vascular disease, unspecified: Secondary | ICD-10-CM | POA: Diagnosis not present

## 2021-02-20 DIAGNOSIS — E538 Deficiency of other specified B group vitamins: Secondary | ICD-10-CM

## 2021-02-20 DIAGNOSIS — E1151 Type 2 diabetes mellitus with diabetic peripheral angiopathy without gangrene: Secondary | ICD-10-CM | POA: Diagnosis not present

## 2021-02-20 DIAGNOSIS — E1121 Type 2 diabetes mellitus with diabetic nephropathy: Secondary | ICD-10-CM | POA: Diagnosis not present

## 2021-02-20 DIAGNOSIS — F330 Major depressive disorder, recurrent, mild: Secondary | ICD-10-CM

## 2021-02-20 DIAGNOSIS — D649 Anemia, unspecified: Secondary | ICD-10-CM | POA: Diagnosis not present

## 2021-02-20 LAB — POCT GLYCOSYLATED HEMOGLOBIN (HGB A1C): Hemoglobin A1C: 6.2 % — AB (ref 4.0–5.6)

## 2021-02-20 MED ORDER — ROSUVASTATIN CALCIUM 10 MG PO TABS: 10.0000 mg | ORAL_TABLET | Freq: Every day | ORAL | 1 refills | Status: DC

## 2021-02-20 MED ORDER — PIOGLITAZONE HCL 15 MG PO TABS: 15.0000 mg | ORAL_TABLET | Freq: Every day | ORAL | 1 refills | Status: DC

## 2021-02-20 MED ORDER — HYDROCHLOROTHIAZIDE 12.5 MG PO TABS: 12.5000 mg | ORAL_TABLET | Freq: Every day | ORAL | 1 refills | Status: DC

## 2021-02-20 MED ORDER — CLOPIDOGREL BISULFATE 75 MG PO TABS: 75.0000 mg | ORAL_TABLET | Freq: Every day | ORAL | 1 refills | Status: DC

## 2021-02-20 MED ORDER — LOSARTAN POTASSIUM 100 MG PO TABS: 100.0000 mg | ORAL_TABLET | Freq: Every day | ORAL | 1 refills | Status: DC

## 2021-02-20 MED ORDER — CITALOPRAM HYDROBROMIDE 20 MG PO TABS: 20.0000 mg | ORAL_TABLET | Freq: Every day | ORAL | 1 refills | Status: DC

## 2021-02-20 NOTE — Progress Notes (Signed)
Name: Lisa [name]   MRN: [id number]    DOB: 1945-10-13   Date:02/20/2021       Progress Note  Subjective  Chief Complaint  Follow Up  HPI  DMII: last A1C is at goal today , down from 8.8% to 7.2 % she is off Metformin , she is on Actos and Januvia and tolerating it well/ A1C today is great at 6.2 %  She has hypertension, dyslipidemia and history of urine micro, PAD . She is on Losartan, statin therapy. Eye exam is up to date. She has not been checking glucose at home. Denies polyphagia or polydipsia but has polyuria. She has neuropathy and has noticed a change in her balance. She has been drinking glucerna and also more vegetables. Continue life style modification    Depression: she is feeling down again, states renting an apartment is too costly and cannot sell her house, her brother moved in and is now drinking every night and she is trying to get him evicted. She states she has been feeling down over the past couple of days  HTN: she states now taking a full pill of Losartan and also HCTZ 12.5 mg daily , she continues to have urinary frequency, I discussed trying to stop hctz and gave her amlodipine 2.5 mg but she went back to HCTZ  Nocturnal enuresis: she has been using pull ups and pads, she states only happens when she sleeps in her bed, but when she sleeping in her chair she does not have nocturnal enuresis, discussed sleep study. She states she snores. She was referred to Urologist , but they advised her to have sleep study first., she states she is not ready to get it done, she states since treated for UTI symptoms improved, but still has urinary frequency and nocturia. Urine culture on her last visit was negative. Discussed on her last visit to stop HCTZ , she states she tried norvasc 2.5 mg but was not controlling bp and it was still causing urinary frequency.   PAD: she was recently seen by Dr. Lucky Cowboy, 01/2021 and ABI was 0.67  on the left  and 1.1 on right.  History of left distal foot  amputation, she has phantom pain , she has pain on both legs from PAD. She also has a history of neuropathy and states staggers when walking , she would like to resume PT   Low vitamin D and B12: taking supplementation   Patient Active Problem List   Diagnosis Date Noted   Uncontrolled type 2 diabetes with eye complications ([place]) 24/01/7352   DM (diabetes mellitus), type 2 with peripheral vascular complications ([place]) 29/92/4268   Amputated toe ([place]) 12/02/2014   Benign essential HTN 12/02/2014   Depression, major, recurrent, mild ([place]) 12/02/2014   Dyslipidemia 12/02/2014   Vitamin B12 deficiency 12/02/2014   Peripheral neuropathic pain 12/02/2014   Peripheral artery disease ([place]) 12/02/2014   Phantom limb ([place]) 12/02/2014   Diabetes mellitus with nephropathy ([place]) 12/02/2014   Detached retina 12/02/2014   Type 2 diabetes mellitus with peripheral neuropathy ([place]) 12/02/2014   Tobacco use 12/02/2014   Urge incontinence 12/02/2014   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   Vitamin D deficiency 12/02/2014    Past Surgical History:  Procedure Laterality Date   ABDOMINAL HYSTERECTOMY  1992   Total   EYE SURGERY     TOE AMPUTATION Left 2011   All of her toes on the left foot   VASCULAR SURGERY      Family History  Problem Relation Age of Onset   Heart disease Father    Diabetes Mother    Diabetes Brother    Kidney disease Brother        Transplant   Heart disease Brother    Diabetes Sister    Diabetes Sister     Social History   Tobacco Use   Smoking status: Every Day    Packs/day: 0.25    Years: 40.00    Pack years: 10.00    Types: Cigarettes    Start date: 03/16/2000   Smokeless tobacco: Never  Substance Use Topics   Alcohol use: No    Alcohol/week: 0.0 standard drinks     Current Outpatient Medications:    aspirin 81 MG tablet, Take 81 mg by mouth daily., Disp: , Rfl:    atropine 1 % ophthalmic solution, Place 1 drop into the right eye 3 (three) times daily., Disp: ,  Rfl:    augmented betamethasone dipropionate (DIPROLENE AF) 0.05 % cream, Apply topically 2 (two) times daily., Disp: 30 g, Rfl: 0   blood glucose meter kit and supplies, , Disp: , Rfl:    Blood Glucose Monitoring Suppl (ACCU-CHEK AVIVA CONNECT) w/Device KIT, 1 each by Does not apply route daily. And supplies E11.9 check fsbs bid, Disp: 1 kit, Rfl: 0   brimonidine (ALPHAGAN) 0.2 % ophthalmic solution, USE 1 DROP IN BOTH EYE TWICE A DAY, Disp: , Rfl: 3   citalopram (CELEXA) 20 MG tablet, TAKE 1 TABLET BY MOUTH EVERY DAY, Disp: 90 tablet, Rfl: 0   clopidogrel (PLAVIX) 75 MG tablet, TAKE 1 TABLET BY MOUTH EVERY DAY, Disp: 90 tablet, Rfl: 0   Cyanocobalamin (B-12) 1000 MCG SUBL, Place 1 tablet under the tongue daily., Disp: 30 tablet, Rfl: 5   hydrochlorothiazide (HYDRODIURIL) 12.5 MG tablet, Take 12.5 mg by mouth daily., Disp: , Rfl:    JANUVIA 100 MG tablet, TAKE 1 TABLET BY MOUTH EVERY DAY, Disp: 90 tablet, Rfl: 0   losartan (COZAAR) 100 MG tablet, TAKE 1 TABLET BY MOUTH EVERY DAY, Disp: 90 tablet, Rfl: 0   Multiple Vitamin (MULTIVITAMIN) tablet, Take 1 tablet by mouth daily., Disp: , Rfl:    pioglitazone (ACTOS) 15 MG tablet, Take 1 tablet (15 mg total) by mouth daily., Disp: 90 tablet, Rfl: 0   prednisoLONE acetate (PRED FORTE) 1 % ophthalmic suspension, USE 1 DROP IN RIGHT EYE 4 TIMES A DAY, Disp: , Rfl: 3   rosuvastatin (CRESTOR) 10 MG tablet, TAKE 1 TABLET BY MOUTH EVERY DAY, Disp: 30 tablet, Rfl: 0   timolol (BETIMOL) 0.5 % ophthalmic solution, 1 drop., Disp: , Rfl:    timolol (TIMOPTIC) 0.5 % ophthalmic solution, 1 drop., Disp: , Rfl:    triamcinolone ointment (KENALOG) 0.1 %, Apply 1 application topically 2 (two) times daily. Mix with Eucerin at home, Disp: 453.6 g, Rfl: 0   Vitamin D, Ergocalciferol, (DRISDOL) 1.25 MG (50000 UNIT) CAPS capsule, TAKE 1 CAPSULE (50,000 UNITS TOTAL) BY MOUTH EVERY 7 (SEVEN) DAYS., Disp: 8 capsule, Rfl: 1   amLODipine (NORVASC) 2.5 MG tablet, TAKE 1 TABLET  (2.5 MG TOTAL) BY MOUTH DAILY. IN PLACE OF HYDROCHLOROTHIAZIDE FOR BP. (Patient not taking: No sig reported), Disp: 90 tablet, Rfl: 0   dorzolamide-timolol (COSOPT) 22.3-6.8 MG/ML ophthalmic solution, USE 1 DROP IN BOTH EYES 2 TIMES A DAY (Patient not taking: No sig reported), Disp: , Rfl: 4  Allergies  Allergen Reactions   Other Itching    Nucinta   Tapentadol Hcl  I personally reviewed active problem list, medication list, allergies, family history, social history, health maintenance with the patient/caregiver today.   ROS  Constitutional: Negative for fever or weight change.  Respiratory: Negative for cough and shortness of breath.   Cardiovascular: Negative for chest pain or palpitations.  Gastrointestinal: Negative for abdominal pain, no bowel changes.  Musculoskeletal: Negative for gait problem or joint swelling.  Skin: Negative for rash.  Neurological: Negative for dizziness or headache.  No other specific complaints in a complete review of systems (except as listed in HPI above).   Objective  Vitals:   02/20/21 1403  BP: 136/74  Pulse: 87  Resp: 16  Temp: 98 F (36.7 C)  SpO2: 95%  Weight: 137 lb (62.1 kg)  Height: 5\' 4"  (1.626 m)    Body mass index is 23.52 kg/m.  Physical Exam  Constitutional: Patient appears well-developed and well-nourished.  No distress.  HEENT: head atraumatic, normocephalic, pupils equal and reactive to light, neck supple Cardiovascular: Normal rate, regular rhythm and normal heart sounds.  No murmur heard. No BLE edema. Pulmonary/Chest: Effort normal and breath sounds normal. No respiratory distress. Abdominal: Soft.  There is no tenderness. Psychiatric: Patient has a normal mood and affect. behavior is normal. Judgment and thought content normal.   Diabetic Foot Exam: Diabetic Foot Exam - Simple   Simple Foot Form Diabetic Foot exam was performed with the following findings: Yes 02/20/2021  2:22 PM  Visual Inspection No  deformities, no ulcerations, no other skin breakdown bilaterally: Yes Sensation Testing Intact to touch and monofilament testing bilaterally: Yes Pulse Check Posterior Tibialis and Dorsalis pulse intact bilaterally: Yes Comments She had left distal foot amputation       PHQ2/9: Depression screen [facility] 2/9 02/20/2021 10/25/2020 10/17/2020 09/24/2020 06/19/2020  Decreased Interest 0 0 1 0 1  Down, Depressed, Hopeless 1 0 1 0 1  PHQ - 2 Score 1 0 2 0 2  Altered sleeping 1 0 0 - 0  Tired, decreased energy 1 0 2 - 1  Change in appetite 0 0 1 - 0  Feeling bad or failure about yourself  0 0 0 - 0  Trouble concentrating 1 0 0 - 0  Moving slowly or fidgety/restless 0 0 0 - 0  Suicidal thoughts 0 0 0 - 0  PHQ-9 Score 4 0 5 - 3  Difficult doing work/chores - Not difficult at all Somewhat difficult - Not difficult at all  Some recent data might be hidden    phq 9 is positive   Fall Risk: Fall Risk  02/20/2021 10/25/2020 10/17/2020 09/24/2020 06/19/2020  Falls in the past year? 1 0 1 1 0  Number falls in past yr: 1 0 0 0 0  Comment - - - - -  Injury with Fall? 0 0 0 0 0  Comment - - - - -  Risk for fall due to : Impaired balance/gait - History of fall(s) - -  Follow up Falls prevention discussed - Falls prevention discussed - -      Functional Status Survey: Is the patient deaf or have difficulty hearing?: No Does the patient have difficulty seeing, even when wearing glasses/contacts?: Yes Does the patient have difficulty concentrating, remembering, or making decisions?: Yes Does the patient have difficulty walking or climbing stairs?: Yes Does the patient have difficulty dressing or bathing?: No Does the patient have difficulty doing errands alone such as visiting a doctor's office or shopping?: No    Assessment & Plan  1. DM (diabetes mellitus), type 2 with peripheral vascular complications (HCC)  - POCT HgB A1C - HM Diabetes Foot Exam - pioglitazone (ACTOS) 15 MG tablet; Take 1  tablet (15 mg total) by mouth daily.  Dispense: 90 tablet; Refill: 1  2. Vitamin B12 deficiency   3. Hypertension associated with type 2 diabetes mellitus ([place])   4. Phantom limb ([place])  Left foot   5. Vitamin D deficiency   6. Dyslipidemia  - rosuvastatin (CRESTOR) 10 MG tablet; Take 1 tablet (10 mg total) by mouth daily.  Dispense: 90 tablet; Refill: 1  7. Depression, major, recurrent, mild (HCC)  - citalopram (CELEXA) 20 MG tablet; Take 1 tablet (20 mg total) by mouth daily.  Dispense: 90 tablet; Refill: 1  8. Peripheral artery disease (HCC)  - rosuvastatin (CRESTOR) 10 MG tablet; Take 1 tablet (10 mg total) by mouth daily.  Dispense: 90 tablet; Refill: 1 - clopidogrel (PLAVIX) 75 MG tablet; Take 1 tablet (75 mg total) by mouth daily.  Dispense: 90 tablet; Refill: 1  9. Diabetes mellitus with nephropathy (HCC)  - losartan (COZAAR) 100 MG tablet; Take 1 tablet (100 mg total) by mouth daily.  Dispense: 90 tablet; Refill: 1

## 2021-02-21 ENCOUNTER — Other Ambulatory Visit: Payer: Self-pay

## 2021-02-21 ENCOUNTER — Other Ambulatory Visit: Payer: Self-pay | Admitting: Family Medicine

## 2021-02-21 ENCOUNTER — Telehealth: Payer: Self-pay

## 2021-02-21 DIAGNOSIS — E876 Hypokalemia: Secondary | ICD-10-CM

## 2021-02-21 MED ORDER — POTASSIUM CHLORIDE CRYS ER 20 MEQ PO TBCR
20.0000 meq | EXTENDED_RELEASE_TABLET | Freq: Four times a day (QID) | ORAL | 0 refills | Status: DC
Start: 2021-02-21 — End: 2021-03-03

## 2021-02-21 NOTE — Telephone Encounter (Signed)
Copied from CRM [id number]. Topic: General - Other >> Feb 21, 2021  2:55 PM Gaetana Michaelis A wrote: Reason for CRM: Patient has returned a missed call from the practice  Please contact further when possible

## 2021-02-21 NOTE — Telephone Encounter (Signed)
Attempted to reach Lisa [name]/left message. Spoke to Her brother, Mr. Lisa [name], he stated he would get in touch with patient and have her call in to our office asap.

## 2021-02-21 NOTE — Progress Notes (Signed)
mag

## 2021-02-22 ENCOUNTER — Other Ambulatory Visit: Payer: Self-pay

## 2021-02-22 DIAGNOSIS — D649 Anemia, unspecified: Secondary | ICD-10-CM

## 2021-02-22 DIAGNOSIS — E876 Hypokalemia: Secondary | ICD-10-CM

## 2021-02-22 NOTE — Progress Notes (Signed)
Labs added.

## 2021-02-22 NOTE — Progress Notes (Signed)
Iron studies added

## 2021-02-22 NOTE — Telephone Encounter (Signed)
Left VM for return call

## 2021-02-23 LAB — CBC WITH DIFFERENTIAL/PLATELET
Absolute Monocytes: 400 cells/uL (ref 200–950)
Basophils Absolute: 9 cells/uL (ref 0–200)
Basophils Relative: 0.2 %
Eosinophils Absolute: 9 cells/uL — ABNORMAL LOW (ref 15–500)
Eosinophils Relative: 0.2 %
HCT: 33.6 % — ABNORMAL LOW (ref 35.0–45.0)
Hemoglobin: 10.9 g/dL — ABNORMAL LOW (ref 11.7–15.5)
Lymphs Abs: 722 cells/uL — ABNORMAL LOW (ref 850–3900)
MCH: 28.2 pg (ref 27.0–33.0)
MCHC: 32.4 g/dL (ref 32.0–36.0)
MCV: 87 fL (ref 80.0–100.0)
MPV: 11.7 fL (ref 7.5–12.5)
Monocytes Relative: 9.1 %
Neutro Abs: 3260 cells/uL (ref 1500–7800)
Neutrophils Relative %: 74.1 %
Platelets: 207 10*3/uL (ref 140–400)
RBC: 3.86 10*6/uL (ref 3.80–5.10)
RDW: 15.5 % — ABNORMAL HIGH (ref 11.0–15.0)
Total Lymphocyte: 16.4 %
WBC: 4.4 10*3/uL (ref 3.8–10.8)

## 2021-02-23 LAB — TEST AUTHORIZATION 2

## 2021-02-23 LAB — COMPLETE METABOLIC PANEL WITH GFR
AG Ratio: 1.3 (calc) (ref 1.0–2.5)
ALT: 12 U/L (ref 6–29)
AST: 19 U/L (ref 10–35)
Albumin: 3.3 g/dL — ABNORMAL LOW (ref 3.6–5.1)
Alkaline phosphatase (APISO): 56 U/L (ref 37–153)
BUN: 12 mg/dL (ref 7–25)
CO2: 40 mmol/L — ABNORMAL HIGH (ref 20–32)
Calcium: 8.5 mg/dL — ABNORMAL LOW (ref 8.6–10.4)
Chloride: 92 mmol/L — ABNORMAL LOW (ref 98–110)
Creat: 0.84 mg/dL (ref 0.60–1.00)
Globulin: 2.6 g/dL (calc) (ref 1.9–3.7)
Glucose, Bld: 148 mg/dL — ABNORMAL HIGH (ref 65–99)
Potassium: 2 mmol/L — CL (ref 3.5–5.3)
Sodium: 142 mmol/L (ref 135–146)
Total Bilirubin: 0.9 mg/dL (ref 0.2–1.2)
Total Protein: 5.9 g/dL — ABNORMAL LOW (ref 6.1–8.1)
eGFR: 72 mL/min/{1.73_m2} (ref >=60)

## 2021-02-23 LAB — LIPID PANEL
Cholesterol: 108 mg/dL (ref <200)
HDL: 57 mg/dL (ref >=50)
LDL Cholesterol (Calc): 33 mg/dL (calc)
Non-HDL Cholesterol (Calc): 51 mg/dL (calc) (ref <130)
Total CHOL/HDL Ratio: 1.9 (calc) (ref <5.0)
Triglycerides: 96 mg/dL (ref <150)

## 2021-02-23 LAB — TEST AUTHORIZATION

## 2021-02-23 LAB — MICROALBUMIN / CREATININE URINE RATIO
Creatinine, Urine: 40 mg/dL (ref 20–275)
Microalb Creat Ratio: 20 mcg/mg creat (ref <30)
Microalb, Ur: 0.8 mg/dL

## 2021-02-23 LAB — VITAMIN B12: Vitamin B-12: 306 pg/mL (ref 200–1100)

## 2021-02-23 LAB — IRON,TIBC AND FERRITIN PANEL
%SAT: 13 % (calc) — ABNORMAL LOW (ref 16–45)
Ferritin: 34 ng/mL (ref 16–288)
Iron: 45 ug/dL (ref 45–160)
TIBC: 341 mcg/dL (calc) (ref 250–450)

## 2021-02-23 LAB — MAGNESIUM: Magnesium: 1.5 mg/dL (ref 1.5–2.5)

## 2021-02-23 LAB — VITAMIN D 25 HYDROXY (VIT D DEFICIENCY, FRACTURES): Vit D, 25-Hydroxy: 51 ng/mL (ref 30–100)

## 2021-02-25 ENCOUNTER — Inpatient Hospital Stay
Admission: EM | Admit: 2021-02-25 | Discharge: 2021-03-03 | DRG: 641 | Disposition: A | Discharge status: Home Health | Payer: Medicare Other | Attending: Internal Medicine | Admitting: Internal Medicine

## 2021-02-25 ENCOUNTER — Emergency Department: Payer: Medicare Other

## 2021-02-25 ENCOUNTER — Other Ambulatory Visit: Payer: Self-pay

## 2021-02-25 DIAGNOSIS — E1142 Type 2 diabetes mellitus with diabetic polyneuropathy: Secondary | ICD-10-CM | POA: Diagnosis present

## 2021-02-25 DIAGNOSIS — F32A Depression, unspecified: Secondary | ICD-10-CM | POA: Diagnosis present

## 2021-02-25 DIAGNOSIS — R0602 Shortness of breath: Secondary | ICD-10-CM

## 2021-02-25 DIAGNOSIS — R9431 Abnormal electrocardiogram [ECG] [EKG]: Secondary | ICD-10-CM | POA: Diagnosis present

## 2021-02-25 DIAGNOSIS — I739 Peripheral vascular disease, unspecified: Secondary | ICD-10-CM | POA: Diagnosis present

## 2021-02-25 DIAGNOSIS — Z90710 Acquired absence of both cervix and uterus: Secondary | ICD-10-CM

## 2021-02-25 DIAGNOSIS — Z86718 Personal history of other venous thrombosis and embolism: Secondary | ICD-10-CM

## 2021-02-25 DIAGNOSIS — Z8249 Family history of ischemic heart disease and other diseases of the circulatory system: Secondary | ICD-10-CM

## 2021-02-25 DIAGNOSIS — F17210 Nicotine dependence, cigarettes, uncomplicated: Secondary | ICD-10-CM | POA: Diagnosis present

## 2021-02-25 DIAGNOSIS — E1151 Type 2 diabetes mellitus with diabetic peripheral angiopathy without gangrene: Secondary | ICD-10-CM | POA: Diagnosis not present

## 2021-02-25 DIAGNOSIS — Z720 Tobacco use: Secondary | ICD-10-CM | POA: Diagnosis present

## 2021-02-25 DIAGNOSIS — Z20822 Contact with and (suspected) exposure to covid-19: Secondary | ICD-10-CM | POA: Diagnosis not present

## 2021-02-25 DIAGNOSIS — I517 Cardiomegaly: Secondary | ICD-10-CM | POA: Diagnosis not present

## 2021-02-25 DIAGNOSIS — N179 Acute kidney failure, unspecified: Secondary | ICD-10-CM | POA: Diagnosis present

## 2021-02-25 DIAGNOSIS — I10 Essential (primary) hypertension: Secondary | ICD-10-CM | POA: Diagnosis present

## 2021-02-25 DIAGNOSIS — R52 Pain, unspecified: Secondary | ICD-10-CM | POA: Diagnosis not present

## 2021-02-25 DIAGNOSIS — E876 Hypokalemia: Secondary | ICD-10-CM | POA: Diagnosis not present

## 2021-02-25 DIAGNOSIS — R21 Rash and other nonspecific skin eruption: Secondary | ICD-10-CM

## 2021-02-25 DIAGNOSIS — I6523 Occlusion and stenosis of bilateral carotid arteries: Secondary | ICD-10-CM | POA: Diagnosis not present

## 2021-02-25 DIAGNOSIS — M50322 Other cervical disc degeneration at C5-C6 level: Secondary | ICD-10-CM | POA: Diagnosis not present

## 2021-02-25 DIAGNOSIS — E785 Hyperlipidemia, unspecified: Secondary | ICD-10-CM | POA: Diagnosis present

## 2021-02-25 DIAGNOSIS — Z7902 Long term (current) use of antithrombotics/antiplatelets: Secondary | ICD-10-CM

## 2021-02-25 DIAGNOSIS — E871 Hypo-osmolality and hyponatremia: Secondary | ICD-10-CM | POA: Diagnosis present

## 2021-02-25 DIAGNOSIS — I959 Hypotension, unspecified: Secondary | ICD-10-CM | POA: Diagnosis not present

## 2021-02-25 DIAGNOSIS — M4312 Spondylolisthesis, cervical region: Secondary | ICD-10-CM | POA: Diagnosis not present

## 2021-02-25 DIAGNOSIS — R159 Full incontinence of feces: Secondary | ICD-10-CM | POA: Diagnosis present

## 2021-02-25 DIAGNOSIS — B029 Zoster without complications: Secondary | ICD-10-CM | POA: Diagnosis present

## 2021-02-25 DIAGNOSIS — I70229 Atherosclerosis of native arteries of extremities with rest pain, unspecified extremity: Secondary | ICD-10-CM | POA: Diagnosis present

## 2021-02-25 DIAGNOSIS — W19XXXA Unspecified fall, initial encounter: Secondary | ICD-10-CM

## 2021-02-25 DIAGNOSIS — I6782 Cerebral ischemia: Secondary | ICD-10-CM | POA: Diagnosis not present

## 2021-02-25 DIAGNOSIS — Z79899 Other long term (current) drug therapy: Secondary | ICD-10-CM

## 2021-02-25 DIAGNOSIS — E559 Vitamin D deficiency, unspecified: Secondary | ICD-10-CM | POA: Diagnosis present

## 2021-02-25 DIAGNOSIS — H409 Unspecified glaucoma: Secondary | ICD-10-CM | POA: Diagnosis present

## 2021-02-25 DIAGNOSIS — R55 Syncope and collapse: Secondary | ICD-10-CM | POA: Diagnosis present

## 2021-02-25 DIAGNOSIS — I82409 Acute embolism and thrombosis of unspecified deep veins of unspecified lower extremity: Secondary | ICD-10-CM

## 2021-02-25 DIAGNOSIS — Z833 Family history of diabetes mellitus: Secondary | ICD-10-CM

## 2021-02-25 DIAGNOSIS — E538 Deficiency of other specified B group vitamins: Secondary | ICD-10-CM | POA: Diagnosis present

## 2021-02-25 DIAGNOSIS — Z7982 Long term (current) use of aspirin: Secondary | ICD-10-CM

## 2021-02-25 DIAGNOSIS — T148XXA Other injury of unspecified body region, initial encounter: Secondary | ICD-10-CM | POA: Diagnosis present

## 2021-02-25 DIAGNOSIS — S0990XA Unspecified injury of head, initial encounter: Secondary | ICD-10-CM | POA: Diagnosis not present

## 2021-02-25 DIAGNOSIS — R32 Unspecified urinary incontinence: Secondary | ICD-10-CM | POA: Diagnosis present

## 2021-02-25 DIAGNOSIS — S199XXA Unspecified injury of neck, initial encounter: Secondary | ICD-10-CM | POA: Diagnosis not present

## 2021-02-25 DIAGNOSIS — R600 Localized edema: Secondary | ICD-10-CM

## 2021-02-25 DIAGNOSIS — Z7984 Long term (current) use of oral hypoglycemic drugs: Secondary | ICD-10-CM

## 2021-02-25 LAB — CBC WITH DIFFERENTIAL/PLATELET
Abs Immature Granulocytes: 0.04 10*3/uL (ref 0.00–0.07)
Basophils Absolute: 0 10*3/uL (ref 0.0–0.1)
Basophils Relative: 0 %
Eosinophils Absolute: 0 10*3/uL (ref 0.0–0.5)
Eosinophils Relative: 0 %
HCT: 31.1 % — ABNORMAL LOW (ref 36.0–46.0)
Hemoglobin: 10.6 g/dL — ABNORMAL LOW (ref 12.0–15.0)
Immature Granulocytes: 0 %
Lymphocytes Relative: 7 %
Lymphs Abs: 0.8 10*3/uL (ref 0.7–4.0)
MCH: 28.3 pg (ref 26.0–34.0)
MCHC: 34.1 g/dL (ref 30.0–36.0)
MCV: 83.2 fL (ref 80.0–100.0)
Monocytes Absolute: 0.7 10*3/uL (ref 0.1–1.0)
Monocytes Relative: 6 %
Neutro Abs: 9.5 10*3/uL — ABNORMAL HIGH (ref 1.7–7.7)
Neutrophils Relative %: 87 %
Platelets: 246 10*3/uL (ref 150–400)
RBC: 3.74 MIL/uL — ABNORMAL LOW (ref 3.87–5.11)
RDW: 16.5 % — ABNORMAL HIGH (ref 11.5–15.5)
WBC: 11 10*3/uL — ABNORMAL HIGH (ref 4.0–10.5)
nRBC: 0 % (ref 0.0–0.2)

## 2021-02-25 LAB — PROTIME-INR
INR: 1 (ref 0.8–1.2)
Prothrombin Time: 13.1 seconds (ref 11.4–15.2)

## 2021-02-25 LAB — COMPREHENSIVE METABOLIC PANEL
ALT: 18 U/L (ref 0–44)
AST: 35 U/L (ref 15–41)
Albumin: 3.1 g/dL — ABNORMAL LOW (ref 3.5–5.0)
Alkaline Phosphatase: 59 U/L (ref 38–126)
Anion gap: 13 (ref 5–15)
BUN: 30 mg/dL — ABNORMAL HIGH (ref 8–23)
CO2: 35 mmol/L — ABNORMAL HIGH (ref 22–32)
Calcium: 7.8 mg/dL — ABNORMAL LOW (ref 8.9–10.3)
Chloride: 86 mmol/L — ABNORMAL LOW (ref 98–111)
Creatinine, Ser: 1.67 mg/dL — ABNORMAL HIGH (ref 0.44–1.00)
GFR, Estimated: 32 mL/min — ABNORMAL LOW (ref >60)
Glucose, Bld: 173 mg/dL — ABNORMAL HIGH (ref 70–99)
Potassium: <2 mmol/L — CL (ref 3.5–5.1)
Sodium: 134 mmol/L — ABNORMAL LOW (ref 135–145)
Total Bilirubin: 1.1 mg/dL (ref 0.3–1.2)
Total Protein: 6.3 g/dL — ABNORMAL LOW (ref 6.5–8.1)

## 2021-02-25 LAB — APTT: aPTT: 36 seconds (ref 24–36)

## 2021-02-25 LAB — MAGNESIUM: Magnesium: 1.5 mg/dL — ABNORMAL LOW (ref 1.7–2.4)

## 2021-02-25 MED ORDER — MAGNESIUM SULFATE 4 GM/100ML IV SOLN
4.0000 g | Freq: Once | INTRAVENOUS | Status: AC
  Administered 2021-02-26: 4 g via INTRAVENOUS
  Filled 2021-02-25: qty 100

## 2021-02-25 MED ORDER — POTASSIUM CHLORIDE 10 MEQ/100ML IV SOLN: 10.0000 meq | INTRAVENOUS | Status: DC

## 2021-02-25 MED ORDER — POTASSIUM CHLORIDE CRYS ER 20 MEQ PO TBCR
40.0000 meq | EXTENDED_RELEASE_TABLET | Freq: Once | ORAL | Status: AC
  Administered 2021-02-26: 40 meq via ORAL
  Filled 2021-02-25: qty 2

## 2021-02-25 MED ORDER — LACTATED RINGERS IV BOLUS
1000.0000 mL | Freq: Once | INTRAVENOUS | Status: AC
  Administered 2021-02-25: 1000 mL via INTRAVENOUS

## 2021-02-25 NOTE — ED Provider Notes (Signed)
[facility] Emergency Department Provider Note  ____________________________________________   Event Date/Time   First MD Initiated Contact with Patient 02/25/21 2117     (approximate)  I have reviewed the triage vital signs and the nursing notes.   HISTORY  Chief Complaint Fall and Herpes Zoster   HPI Lisa [name] is a 75 y.o. female with past medical history of HTN, HDL, PVD, remote DVT, DM, depression, blindness in the right eye very remote history of seizures previously on Dilantin who presents accompanied by her sister for assessment after a fall today.  Patient states she feels like "she is going out of it and she can move her left leg" before falling.  She states she hit her head but denies any LOC.  She also states that 2 days ago she developed shingles mass in her left lower back rating down her left butt cheek.  She had shingles with similar rash 3 to 4 months ago.  She is not currently on acyclovir.  She states that she has actually had 4-5 falls in the last couple weeks and that they are all preceded by similar sensation that she "is going out of it".  She denies any headache, earache, sore throat, vertigo, chest chest pain, cough, shortness of breath but endorses some chronic nausea and diarrhea.  She denies any new urinary symptoms or focal extremity pain weakness numbness or tingling at this time.         Past Medical History:  Diagnosis Date   Blindness of right eye    Depression    Diabetes mellitus without complication (HCC)    DVT of leg (deep venous thrombosis) (HCC)    Glaucoma    Hyperlipidemia    Hypertension    Vascular disease     Patient Active Problem List   Diagnosis Date Noted   Uncontrolled type 2 diabetes with eye complications 01/29/2016   DM (diabetes mellitus), type 2 with peripheral vascular complications (HCC) 09/25/2015   Amputation of left foot (HCC) 12/02/2014   Benign essential HTN 12/02/2014   Depression,  major, recurrent, mild (HCC) 12/02/2014   Dyslipidemia 12/02/2014   Vitamin B12 deficiency 12/02/2014   Peripheral neuropathic pain 12/02/2014   Peripheral artery disease (HCC) 12/02/2014   Phantom limb (HCC) 12/02/2014   Diabetes mellitus with nephropathy (HCC) 12/02/2014   Detached retina 12/02/2014   Type 2 diabetes mellitus with peripheral neuropathy (HCC) 12/02/2014   Tobacco use 12/02/2014   Urge incontinence 12/02/2014   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   Vitamin D deficiency 12/02/2014    Past Surgical History:  Procedure Laterality Date   ABDOMINAL HYSTERECTOMY  1992   Total   EYE SURGERY     TOE AMPUTATION Left 2011   All of her toes on the left foot   VASCULAR SURGERY      Prior to Admission medications   Medication Sig Start Date End Date Taking? Authorizing Provider  potassium chloride SA (KLOR-CON) 20 MEQ tablet Take 1 tablet (20 mEq total) by mouth 4 (four) times daily. For 24 hours after that once a day , recheck potassium in 48 hours 02/21/21   Alba Cory, MD  aspirin 81 MG tablet Take 81 mg by mouth daily.    [provider]  atropine 1 % ophthalmic solution Place 1 drop into the right eye 3 (three) times daily.    [provider]  augmented betamethasone dipropionate (DIPROLENE AF) 0.05 % cream Apply topically 2 (two) times daily. 10/25/20  Kathrine Haddock, NP  Blood Glucose Monitoring Suppl (ACCU-CHEK AVIVA CONNECT) w/Device KIT 1 each by Does not apply route daily. And supplies E11.9 check fsbs bid 07/20/18   Steele Sizer, MD  brimonidine (ALPHAGAN) 0.2 % ophthalmic solution USE 1 DROP IN BOTH EYE TWICE A DAY 09/30/16   [provider]  citalopram (CELEXA) 20 MG tablet Take 1 tablet (20 mg total) by mouth daily. 02/20/21   Steele Sizer, MD  clopidogrel (PLAVIX) 75 MG tablet Take 1 tablet (75 mg total) by mouth daily. 02/20/21   Steele Sizer, MD  Cyanocobalamin (B-12) 1000 MCG SUBL Place 1 tablet under the tongue daily. 08/16/19    Steele Sizer, MD  hydrochlorothiazide (HYDRODIURIL) 12.5 MG tablet Take 1 tablet (12.5 mg total) by mouth daily. 02/20/21   Steele Sizer, MD  JANUVIA 100 MG tablet TAKE 1 TABLET BY MOUTH EVERY DAY 12/01/20   Steele Sizer, MD  losartan (COZAAR) 100 MG tablet Take 1 tablet (100 mg total) by mouth daily. 02/20/21   Steele Sizer, MD  Multiple Vitamin (MULTIVITAMIN) tablet Take 1 tablet by mouth daily.    [provider]  pioglitazone (ACTOS) 15 MG tablet Take 1 tablet (15 mg total) by mouth daily. 02/20/21   Steele Sizer, MD  prednisoLONE acetate (PRED FORTE) 1 % ophthalmic suspension USE 1 DROP IN RIGHT EYE 4 TIMES A DAY 07/27/15   [provider]  rosuvastatin (CRESTOR) 10 MG tablet Take 1 tablet (10 mg total) by mouth daily. 02/20/21   Steele Sizer, MD  timolol (BETIMOL) 0.5 % ophthalmic solution 1 drop. 01/06/07   [provider]  timolol (TIMOPTIC) 0.5 % ophthalmic solution 1 drop. 01/06/07   [provider]  triamcinolone ointment (KENALOG) 0.1 % Apply 1 application topically 2 (two) times daily. Mix with Eucerin at home 06/19/20   Steele Sizer, MD  Vitamin D, Ergocalciferol, (DRISDOL) 1.25 MG (50000 UNIT) CAPS capsule TAKE 1 CAPSULE (50,000 UNITS TOTAL) BY MOUTH EVERY 7 (SEVEN) DAYS. 11/16/20   Steele Sizer, MD    Allergies Other and Tapentadol hcl  Family History  Problem Relation Age of Onset   Heart disease Father    Diabetes Mother    Diabetes Brother    Kidney disease Brother        Transplant   Heart disease Brother    Diabetes Sister    Diabetes Sister     Social History Social History   Tobacco Use   Smoking status: Every Day    Packs/day: 0.25    Years: 40.00    Pack years: 10.00    Types: Cigarettes    Start date: 03/16/2000   Smokeless tobacco: Never  Vaping Use   Vaping Use: Never used  Substance Use Topics   Alcohol use: No    Alcohol/week: 0.0 standard drinks   Drug use: No    Review of Systems  Review  of Systems  Constitutional:  Negative for chills and fever.  HENT:  Negative for sore throat.   Eyes:  Negative for pain.  Respiratory:  Negative for cough and stridor.   Cardiovascular:  Negative for chest pain.  Gastrointestinal:  Negative for vomiting.  Musculoskeletal:  Positive for falls.  Skin:  Negative for rash.  Neurological:  Positive for dizziness. Negative for seizures, loss of consciousness and headaches.  Psychiatric/Behavioral:  Negative for suicidal ideas.   All other systems reviewed and are negative.    ____________________________________________   PHYSICAL EXAM:  VITAL SIGNS: ED Triage Vitals [02/25/21 2110]  Enc Vitals Group     BP (!) 114/50     Pulse Rate (!) 52     Resp 18     Temp 98.4 F (36.9 C)     Temp Source Oral     SpO2 100 %     Weight      Height      Head Circumference      Peak Flow      Pain Score 0     Pain Loc      Pain Edu?      Excl. in [place]?    Vitals:   02/25/21 2110  BP: (!) 114/50  Pulse: (!) 52  Resp: 18  Temp: 98.4 F (36.9 C)  SpO2: 100%   Physical Exam Vitals and nursing note reviewed.  Constitutional:      General: She is not in acute distress.    Appearance: She is well-developed.  HENT:     Head: Normocephalic and atraumatic.     Right Ear: External ear normal.     Left Ear: External ear normal.     Nose: Nose normal.  Eyes:     Conjunctiva/sclera: Conjunctivae normal.  Cardiovascular:     Rate and Rhythm: Normal rate and regular rhythm.     Heart sounds: No murmur heard. Pulmonary:     Effort: Pulmonary effort is normal. No respiratory distress.     Breath sounds: Normal breath sounds.  Abdominal:     Palpations: Abdomen is soft.     Tenderness: There is no abdominal tenderness.  Musculoskeletal:     Cervical back: Neck supple.  Skin:    General: Skin is warm and dry.     Capillary Refill: Capillary refill takes less than 2 seconds.  Neurological:     Mental Status: She is alert and oriented  to person, place, and time.  Psychiatric:        Mood and Affect: Mood normal.    Has a fairly tender well demarcated minimally blanchable rash on the left lower back extending past the buttocks of the left posterior leg.      ____________________________________________   LABS (all labs ordered are listed, but only abnormal results are displayed)  Labs Reviewed  CBC WITH DIFFERENTIAL/PLATELET - Abnormal; Notable for the following components:      Result Value   WBC 11.0 (*)    RBC 3.74 (*)    Hemoglobin 10.6 (*)    HCT 31.1 (*)    RDW 16.5 (*)    Neutro Abs 9.5 (*)    All other components within normal limits  COMPREHENSIVE METABOLIC PANEL - Abnormal; Notable for the following components:   Sodium 134 (*)    Potassium <2.0 (*)    Chloride 86 (*)    CO2 35 (*)    Glucose, Bld 173 (*)    BUN 30 (*)    Creatinine, Ser 1.67 (*)    Calcium 7.8 (*)    Total Protein 6.3 (*)    Albumin 3.1 (*)    GFR, Estimated 32 (*)    All other components within normal limits  MAGNESIUM - Abnormal; Notable for the following components:   Magnesium 1.5 (*)    All other components within normal limits  RESP PANEL BY RT-PCR (FLU A&B, COVID) ARPGX2  CULTURE, BLOOD (ROUTINE X 2)  CULTURE, BLOOD (ROUTINE X 2)  CULTURE, BLOOD (SINGLE)  URINALYSIS, COMPLETE (UACMP) WITH MICROSCOPIC  APTT  PROTIME-INR  PROCALCITONIN  TROPONIN I (HIGH  SENSITIVITY)  TROPONIN I (HIGH SENSITIVITY)   ____________________________________________  EKG  ECG shows sinus bradycardia with ventricular rate of 57, nonspecific ST changes in anterior and lateral leads.  Prolonged QTc interval at 614. ____________________________________________  RADIOLOGY  ED MD interpretation: CT head shows no evidence of intracranial hemorrhage, skull fracture, mass-effect, subacute CVA or other acute intracranial process.  CT C-spine shows some degenerative changes but no acute trauma.  This x-ray has no focal consolidation,  effusion, edema, pneumothorax or other clear acute intrathoracic process.  Official radiology report(s): DG Chest 2 View  Result Date: 02/25/2021 CLINICAL DATA:  Fall EXAM: CHEST - 2 VIEW COMPARISON:  09/07/2009 FINDINGS: Mild cardiomegaly. Aortic atherosclerosis. Focal area of relatively increased density at the left lung base on frontal view is favored to be secondary to the overlapping anterior left sixth and posterior left ninth ribs. No focal airspace consolidation, pleural effusion, or pneumothorax. No acute bony findings. IMPRESSION: No active cardiopulmonary disease. Electronically Signed   By: Davina Poke D.O.   On: 02/25/2021 21:42   CT HEAD WO CONTRAST (5MM)  Result Date: 02/25/2021 CLINICAL DATA:  Head trauma, minor (Age >= 65y) EXAM: CT HEAD WITHOUT CONTRAST TECHNIQUE: Contiguous axial images were obtained from the base of the skull through the vertex without intravenous contrast. COMPARISON:  12/04/2006 FINDINGS: Brain: No evidence of acute infarction, hemorrhage, hydrocephalus, extra-axial collection or mass lesion/mass effect. Unchanged mineralization noted within the basal ganglia bilaterally. Scattered low-density changes within the periventricular and subcortical white matter compatible with chronic microvascular ischemic change. Mild diffuse cerebral volume loss. Vascular: Atherosclerotic calcifications involving the large vessels of the skull base. No unexpected hyperdense vessel. Skull: Normal. Negative for fracture or focal lesion. Sinuses/Orbits: Visualized paranasal sinuses and mastoid air cells are clear. Right globe prosthesis. Other: Negative for scalp hematoma. IMPRESSION: 1. No acute intracranial findings. 2. Mild chronic microvascular ischemic change and cerebral volume loss. Electronically Signed   By: Davina Poke D.O.   On: 02/25/2021 21:54   CT Cervical Spine Wo Contrast  Result Date: 02/25/2021 CLINICAL DATA:  Neck trauma (Age >= 65y) EXAM: CT CERVICAL SPINE  WITHOUT CONTRAST TECHNIQUE: Multidetector CT imaging of the cervical spine was performed without intravenous contrast. Multiplanar CT image reconstructions were also generated. COMPARISON:  None. FINDINGS: Alignment: Facet joints are aligned without dislocation or traumatic listhesis. Dens and lateral masses are aligned. Straightening of the cervical lordosis with trace retrolisthesis at C5-6. Skull base and vertebrae: No acute fracture. No primary bone lesion or focal pathologic process. Soft tissues and spinal canal: No prevertebral fluid or swelling. No visible canal hematoma. Disc levels: Degenerative disc disease of C5-6. Facet arthropathy is most notable on the right at C7-T1. Upper chest: Included lung apices are clear. Other: Atherosclerotic calcifications of the visualized aortic arch and branch vessels as well as the bilateral carotid arteries. IMPRESSION: 1. No acute fracture or traumatic listhesis of the cervical spine. 2. Degenerative disc disease of C5-6. Aortic Atherosclerosis (ICD10-I70.0). Electronically Signed   By: Davina Poke D.O.   On: 02/25/2021 21:57    ____________________________________________   PROCEDURES  Procedure(s) performed (including Critical Care):  .1-3 Lead EKG Interpretation Performed by: Lucrezia Starch, MD Authorized by: Lucrezia Starch, MD     Interpretation: non-specific     ECG rate assessment: bradycardic     Rhythm: sinus bradycardia     Ectopy: none     Conduction: normal   .Critical Care Performed by: Lucrezia Starch, MD Authorized by: Lucrezia Starch, MD  Critical care provider statement:    Critical care time (minutes):  45   Critical care was necessary to treat or prevent imminent or life-threatening deterioration of the following conditions: K<2.   Critical care was time spent personally by me on the following activities:  Discussions with consultants, evaluation of patient's response to treatment, examination of patient, ordering  and performing treatments and interventions, ordering and review of laboratory studies, ordering and review of radiographic studies, pulse oximetry, re-evaluation of patient's condition, obtaining history from patient or surrogate and review of old charts   ____________________________________________   [place] [facility] [place] / ED COURSE      Patient presents with above-stated history exam for assessment after fall today with patient stating he hit her head as well as a left-sided low back and posterior leg rash that she states came on the last 2 days and she is concerned may be shingles.  On arrival patient is borderline hypotensive with BP of 114/50 and bradycardic at 52 with otherwise stable vital signs on room air.  She is a nonfocal supine neurological exam and she is oriented x4.  Patient describes a sensation of "being frozen" before falling.  She also states she feels weak in her left leg right before falling.  This is not associated lightheadedness chest pain visual symptoms or dizziness.  Differential includes possible seizure,, intracranial mass, increased intracranial pressure, IPH, metabolic derangements, and arrhythmia.  Overall history is less consistent with tacky dysrhythmia.  Am unsure if the rash in the left lower back and posterior leg is related although the rash does not appear consistent with typical shingles.  It is fairly macular flat well-demarcated and nonblanchable.  It is almost purpuric appearing.  ECG shows bradycardia with prolonged QTc interval which could certainly be contributing to patient's feeling weak.  CMP remarkable for undetectable K, chloride of 86 and a glucose of 173 with a BUN of 30 and a creatinine of 1.67 compared to 0.855 days ago.  Museum is 1.5.  CBC shows WBC count of 11, hemoglobin of 6.9 compared to 10.95 days ago and normal platelets.  Overall I am unsure of the precise etiology of patient's falls and her rash although given  recurrence of falls with evidence of fairly significant electrolyte derangements and AKI I think patient is warranted for further work-up evaluation and management.   ____________________________________________   FINAL CLINICAL IMPRESSION(S) / ED DIAGNOSES  Final diagnoses:  AKI (acute kidney injury) ([place])  Rash  Hypokalemia  Hypomagnesemia  Fall, initial encounter    Medications  potassium chloride 10 mEq in 100 mL IVPB (has no administration in time range)  potassium chloride SA (KLOR-CON) CR tablet 40 mEq (has no administration in time range)  magnesium sulfate IVPB 4 g 100 mL (has no administration in time range)  lactated ringers bolus 1,000 mL (1,000 mLs Intravenous New Bag/Given 02/25/21 2241)     ED Discharge Orders     None        Note:  This document was prepared using Dragon voice recognition software and may include unintentional dictation errors.    Lucrezia Starch, MD 02/25/21 [phone]

## 2021-02-25 NOTE — ED Notes (Signed)
Dr. Katrinka Blazing is aware of patient's potassium level being less than 2.

## 2021-02-25 NOTE — H&P (Signed)
History and Physical    Lisa [name] YGD:[id number] DOB: Jul 11, 1945 DOA: 02/25/2021  PCP: Alba Cory, MD    Patient coming from:  Home   Chief Complaint:  Fall and zoster   HPI: Lisa [name] is a 75 y.o. female seen in ed with complaints of a fall today, patient reports that she has difficulty moving her left leg after the fall she also reports hitting her head but did not pass out.  Patient started to develop shingles  3 months ago on her right lower back with pain radiating to her left buttock with similar episode few months ago. Pt is alert,and oriented but states rash was noted by her for 3 days only.  She is not bedridden but has chronic urine incontinence and diarrhea.pt also describes weakness and passing out , going in and out. No family at bedside. Mom had dm and dad had heart problems.  Has been following and prior to her falls she feels like she is going out patient otherwise denies any other complaints review of systems is negative except for dysuria.  Patient sees Dr. Wyn Quaker vascular surgeon for her peripheral vascular disease.  Pt has past medical history of blindness in the right eye, diabetes mellitus type 2, DVT, glaucoma, hypertension, depression, dyslipidemia, vitamin B12 deficiency, retinal detachment history, tobacco abuse.  Patient also has allergies to tapentadol.  ED Course:  Vitals:   02/25/21 2110  BP: (!) 114/50  Pulse: (!) 52  Resp: 18  Temp: 98.4 F (36.9 C)  TempSrc: Oral  SpO2: 100%  In the emergency room patient is alert awake afebrile. CMP shows hyponatremia of 134, potassium less than 2.0, chloride 86 glucose 173 creatinine of 167 which is acute magnesium of 1.5, anion gap of 13, CBC shows white count of 11 hemoglobin of 10.6 and platelet count of 246.  In the ED patient was given LR 1 L bolus followed by potassium replacements.  Review of Systems:  Review of Systems  Constitutional:  Positive for malaise/fatigue.  HENT: Negative.    Eyes:  Negative.   Respiratory: Negative.    Cardiovascular: Negative.   Gastrointestinal: Negative.   Musculoskeletal: Negative.   Skin:  Positive for rash.  Neurological: Negative.   Endo/Heme/Allergies: Negative.   Psychiatric/Behavioral: Negative.      Past Medical History:  Diagnosis Date   Blindness of right eye    Depression    Diabetes mellitus without complication (HCC)    DVT of leg (deep venous thrombosis) (HCC)    Glaucoma    Hyperlipidemia    Hypertension    Vascular disease     Past Surgical History:  Procedure Laterality Date   ABDOMINAL HYSTERECTOMY  1992   Total   EYE SURGERY     TOE AMPUTATION Left 2011   All of her toes on the left foot   VASCULAR SURGERY       reports that she has been smoking cigarettes. She started smoking about 20 years ago. She has a 10.00 pack-year smoking history. She has never used smokeless tobacco. She reports that she does not drink alcohol and does not use drugs.  Allergies  Allergen Reactions   Other Itching    Nucinta   Tapentadol Hcl     Family History  Problem Relation Age of Onset   Heart disease Father    Diabetes Mother    Diabetes Brother    Kidney disease Brother        Transplant   Heart disease Brother  Diabetes Sister    Diabetes Sister     Prior to Admission medications   Medication Sig Start Date End Date Taking? Authorizing Provider  potassium chloride SA (KLOR-CON) 20 MEQ tablet Take 1 tablet (20 mEq total) by mouth 4 (four) times daily. For 24 hours after that once a day , recheck potassium in 48 hours 02/21/21   Alba Cory, MD  aspirin 81 MG tablet Take 81 mg by mouth daily.    [provider]  atropine 1 % ophthalmic solution Place 1 drop into the right eye 3 (three) times daily.    [provider]  augmented betamethasone dipropionate (DIPROLENE AF) 0.05 % cream Apply topically 2 (two) times daily. 10/25/20   Gabriel Cirri, NP  Blood Glucose Monitoring Suppl (ACCU-CHEK AVIVA  CONNECT) w/Device KIT 1 each by Does not apply route daily. And supplies E11.9 check fsbs bid 07/20/18   Alba Cory, MD  brimonidine (ALPHAGAN) 0.2 % ophthalmic solution USE 1 DROP IN BOTH EYE TWICE A DAY 09/30/16   [provider]  citalopram (CELEXA) 20 MG tablet Take 1 tablet (20 mg total) by mouth daily. 02/20/21   Alba Cory, MD  clopidogrel (PLAVIX) 75 MG tablet Take 1 tablet (75 mg total) by mouth daily. 02/20/21   Alba Cory, MD  Cyanocobalamin (B-12) 1000 MCG SUBL Place 1 tablet under the tongue daily. 08/16/19   Alba Cory, MD  hydrochlorothiazide (HYDRODIURIL) 12.5 MG tablet Take 1 tablet (12.5 mg total) by mouth daily. 02/20/21   Alba Cory, MD  JANUVIA 100 MG tablet TAKE 1 TABLET BY MOUTH EVERY DAY 12/01/20   Alba Cory, MD  losartan (COZAAR) 100 MG tablet Take 1 tablet (100 mg total) by mouth daily. 02/20/21   Alba Cory, MD  Multiple Vitamin (MULTIVITAMIN) tablet Take 1 tablet by mouth daily.    [provider]  pioglitazone (ACTOS) 15 MG tablet Take 1 tablet (15 mg total) by mouth daily. 02/20/21   Alba Cory, MD  prednisoLONE acetate (PRED FORTE) 1 % ophthalmic suspension USE 1 DROP IN RIGHT EYE 4 TIMES A DAY 07/27/15   [provider]  rosuvastatin (CRESTOR) 10 MG tablet Take 1 tablet (10 mg total) by mouth daily. 02/20/21   Alba Cory, MD  timolol (BETIMOL) 0.5 % ophthalmic solution 1 drop. 01/06/07   [provider]  timolol (TIMOPTIC) 0.5 % ophthalmic solution 1 drop. 01/06/07   [provider]  triamcinolone ointment (KENALOG) 0.1 % Apply 1 application topically 2 (two) times daily. Mix with Eucerin at home 06/19/20   Alba Cory, MD  Vitamin D, Ergocalciferol, (DRISDOL) 1.25 MG (50000 UNIT) CAPS capsule TAKE 1 CAPSULE (50,000 UNITS TOTAL) BY MOUTH EVERY 7 (SEVEN) DAYS. 11/16/20   Alba Cory, MD    Physical Exam: Vitals:   02/25/21 2110  BP: (!) 114/50  Pulse: (!) 52  Resp: 18  Temp: 98.4 F  (36.9 C)  TempSrc: Oral  SpO2: 100%   Physical Exam Vitals reviewed.  Constitutional:      General: She is not in acute distress.    Appearance: She is diaphoretic. She is not ill-appearing or toxic-appearing.  HENT:     Head: Normocephalic and atraumatic.     Right Ear: External ear normal.     Left Ear: External ear normal.     Nose: Nose normal.     Mouth/Throat:     Mouth: Mucous membranes are dry.  Eyes:     Extraocular Movements: Extraocular movements intact.  Cardiovascular:  Rate and Rhythm: Normal rate and regular rhythm.     Pulses: Normal pulses.     Heart sounds: Normal heart sounds.  Pulmonary:     Effort: Pulmonary effort is normal.     Breath sounds: Normal breath sounds.  Abdominal:     General: Bowel sounds are normal. There is no distension.     Palpations: Abdomen is soft. There is no mass.     Tenderness: There is no abdominal tenderness. There is no guarding.     Hernia: No hernia is present.  Skin:    Findings: Rash present.     Comments: Extensive bruising like rash on her back and legs.   Neurological:     General: No focal deficit present.     Mental Status: She is alert and oriented to person, place, and time.  Psychiatric:        Mood and Affect: Mood normal.        Behavior: Behavior normal.   Labs on Admission: I have personally reviewed following labs and imaging studies  No results for input(s): CKTOTAL, CKMB, TROPONINI in the last 72 hours. Lab Results  Component Value Date   WBC 11.0 (H) 02/25/2021   HGB 10.6 (L) 02/25/2021   HCT 31.1 (L) 02/25/2021   MCV 83.2 02/25/2021   PLT 246 02/25/2021    Recent Labs  Lab 02/25/21 2238  NA 134*  K <2.0*  CL 86*  CO2 35*  BUN 30*  CREATININE 1.67*  CALCIUM 7.8*  PROT 6.3*  BILITOT 1.1  ALKPHOS 59  ALT 18  AST 35  GLUCOSE 173*   Lab Results  Component Value Date   CHOL 108 02/20/2021   HDL 57 02/20/2021   LDLCALC 33 02/20/2021   TRIG 96 02/20/2021   No results found  for: DDIMER Invalid input(s): POCBNP  Urinalysis    Component Value Date/Time   APPEARANCEUR Clear 04/07/2018 1319   GLUCOSEU Negative 04/07/2018 1319   BILIRUBINUR Negative 09/24/2020 1615   BILIRUBINUR Negative 04/07/2018 1319   PROTEINUR Negative 09/24/2020 1615   PROTEINUR 2+ (A) 04/07/2018 1319   UROBILINOGEN 0.2 09/24/2020 1615   NITRITE Negative 09/24/2020 1615   NITRITE Negative 04/07/2018 1319   LEUKOCYTESUR Negative 09/24/2020 1615   LEUKOCYTESUR Negative 04/07/2018 1319    COVID-19 Labs: Pending   Radiological Exams on Admission: DG Chest 2 View  Result Date: 02/25/2021 CLINICAL DATA:  Fall EXAM: CHEST - 2 VIEW COMPARISON:  09/07/2009 FINDINGS: Mild cardiomegaly. Aortic atherosclerosis. Focal area of relatively increased density at the left lung base on frontal view is favored to be secondary to the overlapping anterior left sixth and posterior left ninth ribs. No focal airspace consolidation, pleural effusion, or pneumothorax. No acute bony findings. IMPRESSION: No active cardiopulmonary disease. Electronically Signed   By: Davina Poke D.O.   On: 02/25/2021 21:42   CT HEAD WO CONTRAST (5MM)  Result Date: 02/25/2021 CLINICAL DATA:  Head trauma, minor (Age >= 65y) EXAM: CT HEAD WITHOUT CONTRAST TECHNIQUE: Contiguous axial images were obtained from the base of the skull through the vertex without intravenous contrast. COMPARISON:  12/04/2006 FINDINGS: Brain: No evidence of acute infarction, hemorrhage, hydrocephalus, extra-axial collection or mass lesion/mass effect. Unchanged mineralization noted within the basal ganglia bilaterally. Scattered low-density changes within the periventricular and subcortical white matter compatible with chronic microvascular ischemic change. Mild diffuse cerebral volume loss. Vascular: Atherosclerotic calcifications involving the large vessels of the skull base. No unexpected hyperdense vessel. Skull: Normal. Negative for fracture or  focal  lesion. Sinuses/Orbits: Visualized paranasal sinuses and mastoid air cells are clear. Right globe prosthesis. Other: Negative for scalp hematoma. IMPRESSION: 1. No acute intracranial findings. 2. Mild chronic microvascular ischemic change and cerebral volume loss. Electronically Signed   By: Davina Poke D.O.   On: 02/25/2021 21:54   CT Cervical Spine Wo Contrast  Result Date: 02/25/2021 CLINICAL DATA:  Neck trauma (Age >= 65y) EXAM: CT CERVICAL SPINE WITHOUT CONTRAST TECHNIQUE: Multidetector CT imaging of the cervical spine was performed without intravenous contrast. Multiplanar CT image reconstructions were also generated. COMPARISON:  None. FINDINGS: Alignment: Facet joints are aligned without dislocation or traumatic listhesis. Dens and lateral masses are aligned. Straightening of the cervical lordosis with trace retrolisthesis at C5-6. Skull base and vertebrae: No acute fracture. No primary bone lesion or focal pathologic process. Soft tissues and spinal canal: No prevertebral fluid or swelling. No visible canal hematoma. Disc levels: Degenerative disc disease of C5-6. Facet arthropathy is most notable on the right at C7-T1. Upper chest: Included lung apices are clear. Other: Atherosclerotic calcifications of the visualized aortic arch and branch vessels as well as the bilateral carotid arteries. IMPRESSION: 1. No acute fracture or traumatic listhesis of the cervical spine. 2. Degenerative disc disease of C5-6. Aortic Atherosclerosis (ICD10-I70.0). Electronically Signed   By: Davina Poke D.O.   On: 02/25/2021 21:57    EKG: Independently reviewed.  Sinus bradycardia 57 with prolonged QTC at 614.  Due to patient's hypomagnesemia and hypokalemia.   Assessment/Plan Principal Problem:   Syncope and collapse Active Problems:   Prolonged QT interval   AKI (acute kidney injury) (HCC)   Hypokalemia   Hypomagnesemia   Bruising   DM (diabetes mellitus), type 2 with peripheral vascular  complications (HCC)   Benign essential HTN   Vitamin B12 deficiency   Peripheral artery disease (HCC)   Tobacco use   Syncope and collapse: Blood pressure (!) 114/50, pulse (!) 52, temperature 98.4 F (36.9 C), temperature source Oral, resp. rate 18, SpO2 100 %. Blood pressure is low normal differentials include orthostatic hypotension related syncope, neurological conditions contributing or causing it.  We will obtain an MRI of the brain and carotid Dopplers and 2D echocardiogram.  Prolonged QT: Attribute to hypomagnesemia and hypokalemia, magnesium is being replaced along with potassium in the emergency room and we will follow levels with daily EKGs.  Avoid any prolonged QT medications.  Acute kidney injury: Lab Results  Component Value Date   CREATININE 1.67 (H) 02/25/2021   CREATININE 0.84 02/20/2021   CREATININE 0.89 02/17/2020  Attribute to combination of dehydration and decreased p.o. intake and medications.   Hypokalemia/hypomagnesemia: Patient receiving 6 rounds of potassium chloride, 4 g of magnesium IV.  Bruising/rash: It is unclear to me the etiology of this patient's rash.  We will obtain an ANA and a sed rate and try to be evaluated further and get a dermatology consult per a.m. team.  Diabetes mellitus type 2: Patient's home regimen with Januvia, Actos held on patient to be covered by sliding scale insulin regimen.    Hypertension: Blood pressure is low normal, We will continue patient on hydralazine, Crestor 10 mg.  B12 deficiency: We will check patient's B12 level.  Peripheral arterial disease: Stable patient being followed by vascular specialist.  Tobacco abuse: Nicotine patch.  Counseling once patient is more comfortable.  DVT prophylaxis:  Heparin  Code Status:  Full code  Family Communication:  Colman Cater (Brother)  [phone] (Mobile)   Disposition Plan:  Home  Consults called:  None  Admission status: Inpatient   Para Skeans MD [facility] [phone] How to contact the [facility] Attending or Consulting provider [place] or covering provider during after hours [place], for this patient.    Check the care team in [facility] and look for a) attending/consulting [facility] provider listed and b) the [facility] team listed Log into [email or web address] and use [facility]'s universal password to access. If you do not have the password, please contact the hospital operator. Locate the [facility] provider you are looking for under [facility] and page to a number that you can be directly reached. If you still have difficulty reaching the provider, please page the [facility] (Director on Call) for the Hospitalists listed on amion for assistance. [email or web address] Password [email or web address] 02/26/2021, 12:21 AM

## 2021-02-25 NOTE — ED Notes (Signed)
[facility] is in place. 

## 2021-02-25 NOTE — Telephone Encounter (Signed)
Pt is calling Lisa [name] back please advise [phone]

## 2021-02-25 NOTE — ED Notes (Signed)
Patient is at imaging. Family at bedside.

## 2021-02-25 NOTE — ED Triage Notes (Addendum)
Pt presents to ER via [facility] after a fall around 1800 today.  Pt states she has been having more frequent falls.  Pt states she feels like she is "going out of it" and then falls out.  Pt does take plavix at home.  Pt states she hit top of her head on the ground, but denies loc.  Pt A&O x4 at this time.  Pt also began having shingles type rash on left side of back and buttocks 2 days ago.  Pt states she also had shingles 3-4 months ago.

## 2021-02-26 ENCOUNTER — Inpatient Hospital Stay: Payer: Medicare Other

## 2021-02-26 ENCOUNTER — Inpatient Hospital Stay
Admit: 2021-02-26 | Discharge: 2021-02-26 | Disposition: A | Discharge status: Home / Self Care | Payer: Medicare Other | Attending: Internal Medicine | Admitting: Internal Medicine

## 2021-02-26 DIAGNOSIS — I10 Essential (primary) hypertension: Secondary | ICD-10-CM | POA: Diagnosis present

## 2021-02-26 DIAGNOSIS — E876 Hypokalemia: Secondary | ICD-10-CM | POA: Diagnosis present

## 2021-02-26 DIAGNOSIS — N179 Acute kidney failure, unspecified: Secondary | ICD-10-CM | POA: Diagnosis present

## 2021-02-26 DIAGNOSIS — E538 Deficiency of other specified B group vitamins: Secondary | ICD-10-CM | POA: Diagnosis present

## 2021-02-26 DIAGNOSIS — R0602 Shortness of breath: Secondary | ICD-10-CM | POA: Diagnosis not present

## 2021-02-26 DIAGNOSIS — Z90710 Acquired absence of both cervix and uterus: Secondary | ICD-10-CM | POA: Diagnosis not present

## 2021-02-26 DIAGNOSIS — Z7902 Long term (current) use of antithrombotics/antiplatelets: Secondary | ICD-10-CM | POA: Diagnosis not present

## 2021-02-26 DIAGNOSIS — Z20822 Contact with and (suspected) exposure to covid-19: Secondary | ICD-10-CM | POA: Diagnosis present

## 2021-02-26 DIAGNOSIS — R55 Syncope and collapse: Secondary | ICD-10-CM | POA: Diagnosis present

## 2021-02-26 DIAGNOSIS — F32A Depression, unspecified: Secondary | ICD-10-CM | POA: Diagnosis present

## 2021-02-26 DIAGNOSIS — E1151 Type 2 diabetes mellitus with diabetic peripheral angiopathy without gangrene: Secondary | ICD-10-CM | POA: Diagnosis present

## 2021-02-26 DIAGNOSIS — R600 Localized edema: Secondary | ICD-10-CM | POA: Diagnosis not present

## 2021-02-26 DIAGNOSIS — E559 Vitamin D deficiency, unspecified: Secondary | ICD-10-CM | POA: Diagnosis present

## 2021-02-26 DIAGNOSIS — Z8249 Family history of ischemic heart disease and other diseases of the circulatory system: Secondary | ICD-10-CM | POA: Diagnosis not present

## 2021-02-26 DIAGNOSIS — E871 Hypo-osmolality and hyponatremia: Secondary | ICD-10-CM | POA: Diagnosis present

## 2021-02-26 DIAGNOSIS — Z86718 Personal history of other venous thrombosis and embolism: Secondary | ICD-10-CM | POA: Diagnosis not present

## 2021-02-26 DIAGNOSIS — Z7982 Long term (current) use of aspirin: Secondary | ICD-10-CM | POA: Diagnosis not present

## 2021-02-26 DIAGNOSIS — J9811 Atelectasis: Secondary | ICD-10-CM | POA: Diagnosis not present

## 2021-02-26 DIAGNOSIS — E785 Hyperlipidemia, unspecified: Secondary | ICD-10-CM | POA: Diagnosis present

## 2021-02-26 DIAGNOSIS — Z79899 Other long term (current) drug therapy: Secondary | ICD-10-CM | POA: Diagnosis not present

## 2021-02-26 DIAGNOSIS — R32 Unspecified urinary incontinence: Secondary | ICD-10-CM | POA: Diagnosis present

## 2021-02-26 DIAGNOSIS — I959 Hypotension, unspecified: Secondary | ICD-10-CM | POA: Diagnosis present

## 2021-02-26 DIAGNOSIS — E119 Type 2 diabetes mellitus without complications: Secondary | ICD-10-CM | POA: Diagnosis not present

## 2021-02-26 DIAGNOSIS — E1142 Type 2 diabetes mellitus with diabetic polyneuropathy: Secondary | ICD-10-CM | POA: Diagnosis present

## 2021-02-26 DIAGNOSIS — Z833 Family history of diabetes mellitus: Secondary | ICD-10-CM | POA: Diagnosis not present

## 2021-02-26 DIAGNOSIS — R29898 Other symptoms and signs involving the musculoskeletal system: Secondary | ICD-10-CM | POA: Diagnosis not present

## 2021-02-26 DIAGNOSIS — I808 Phlebitis and thrombophlebitis of other sites: Secondary | ICD-10-CM | POA: Diagnosis not present

## 2021-02-26 DIAGNOSIS — Z7984 Long term (current) use of oral hypoglycemic drugs: Secondary | ICD-10-CM | POA: Diagnosis not present

## 2021-02-26 DIAGNOSIS — R531 Weakness: Secondary | ICD-10-CM | POA: Diagnosis not present

## 2021-02-26 DIAGNOSIS — T148XXA Other injury of unspecified body region, initial encounter: Secondary | ICD-10-CM | POA: Diagnosis present

## 2021-02-26 DIAGNOSIS — F17210 Nicotine dependence, cigarettes, uncomplicated: Secondary | ICD-10-CM | POA: Diagnosis present

## 2021-02-26 DIAGNOSIS — R4182 Altered mental status, unspecified: Secondary | ICD-10-CM | POA: Diagnosis not present

## 2021-02-26 DIAGNOSIS — H409 Unspecified glaucoma: Secondary | ICD-10-CM | POA: Diagnosis present

## 2021-02-26 DIAGNOSIS — R9431 Abnormal electrocardiogram [ECG] [EKG]: Secondary | ICD-10-CM | POA: Diagnosis present

## 2021-02-26 LAB — CBC
HCT: 29.7 % — ABNORMAL LOW (ref 36.0–46.0)
Hemoglobin: 10.2 g/dL — ABNORMAL LOW (ref 12.0–15.0)
MCH: 28 pg (ref 26.0–34.0)
MCHC: 34.3 g/dL (ref 30.0–36.0)
MCV: 81.6 fL (ref 80.0–100.0)
Platelets: 230 10*3/uL (ref 150–400)
RBC: 3.64 MIL/uL — ABNORMAL LOW (ref 3.87–5.11)
RDW: 16.6 % — ABNORMAL HIGH (ref 11.5–15.5)
WBC: 8.2 10*3/uL (ref 4.0–10.5)
nRBC: 0 % (ref 0.0–0.2)

## 2021-02-26 LAB — TSH: TSH: 7.867 u[IU]/mL — ABNORMAL HIGH (ref 0.350–4.500)

## 2021-02-26 LAB — URINALYSIS, COMPLETE (UACMP) WITH MICROSCOPIC
Bilirubin Urine: NEGATIVE
Glucose, UA: 50 mg/dL — AB
Hgb urine dipstick: NEGATIVE
Ketones, ur: 5 mg/dL — AB
Nitrite: NEGATIVE
Protein, ur: 30 mg/dL — AB
Specific Gravity, Urine: 1.017 (ref 1.005–1.030)
pH: 5 (ref 5.0–8.0)

## 2021-02-26 LAB — COMPREHENSIVE METABOLIC PANEL
ALT: 17 U/L (ref 0–44)
AST: 31 U/L (ref 15–41)
Albumin: 2.6 g/dL — ABNORMAL LOW (ref 3.5–5.0)
Alkaline Phosphatase: 51 U/L (ref 38–126)
Anion gap: 9 (ref 5–15)
BUN: 24 mg/dL — ABNORMAL HIGH (ref 8–23)
CO2: 35 mmol/L — ABNORMAL HIGH (ref 22–32)
Calcium: 7.5 mg/dL — ABNORMAL LOW (ref 8.9–10.3)
Chloride: 95 mmol/L — ABNORMAL LOW (ref 98–111)
Creatinine, Ser: 1.16 mg/dL — ABNORMAL HIGH (ref 0.44–1.00)
GFR, Estimated: 49 mL/min — ABNORMAL LOW (ref >60)
Glucose, Bld: 165 mg/dL — ABNORMAL HIGH (ref 70–99)
Potassium: <2 mmol/L — CL (ref 3.5–5.1)
Sodium: 139 mmol/L (ref 135–145)
Total Bilirubin: 1.1 mg/dL (ref 0.3–1.2)
Total Protein: 5.3 g/dL — ABNORMAL LOW (ref 6.5–8.1)

## 2021-02-26 LAB — PHOSPHORUS: Phosphorus: 3.3 mg/dL (ref 2.5–4.6)

## 2021-02-26 LAB — TROPONIN I (HIGH SENSITIVITY)
Troponin I (High Sensitivity): 73 ng/L — ABNORMAL HIGH (ref <18)
Troponin I (High Sensitivity): 64 ng/L — ABNORMAL HIGH (ref <18)
Troponin I (High Sensitivity): 57 ng/L — ABNORMAL HIGH (ref <18)

## 2021-02-26 LAB — BASIC METABOLIC PANEL
Anion gap: 10 (ref 5–15)
BUN: 18 mg/dL (ref 8–23)
CO2: 33 mmol/L — ABNORMAL HIGH (ref 22–32)
Calcium: 7.7 mg/dL — ABNORMAL LOW (ref 8.9–10.3)
Chloride: 95 mmol/L — ABNORMAL LOW (ref 98–111)
Creatinine, Ser: 0.87 mg/dL (ref 0.44–1.00)
GFR, Estimated: >60 mL/min (ref >60)
Glucose, Bld: 197 mg/dL — ABNORMAL HIGH (ref 70–99)
Potassium: 2.5 mmol/L — CL (ref 3.5–5.1)
Sodium: 138 mmol/L (ref 135–145)

## 2021-02-26 LAB — ECHOCARDIOGRAM COMPLETE
AR max vel: 4.36 cm2
AV Area VTI: 4.83 cm2
AV Area mean vel: 3.96 cm2
AV Mean grad: 3.5 mmHg
AV Peak grad: 5.7 mmHg
Ao pk vel: 1.19 m/s
Area-P 1/2: 2.87 cm2
MV VTI: 3.17 cm2
S' Lateral: 2.6 cm

## 2021-02-26 LAB — APTT: aPTT: 38 seconds — ABNORMAL HIGH (ref 24–36)

## 2021-02-26 LAB — RESP PANEL BY RT-PCR (FLU A&B, COVID) ARPGX2
Influenza A by PCR: NEGATIVE
Influenza B by PCR: NEGATIVE
SARS Coronavirus 2 by RT PCR: NEGATIVE

## 2021-02-26 LAB — PROTIME-INR
INR: 1 (ref 0.8–1.2)
Prothrombin Time: 13.1 seconds (ref 11.4–15.2)

## 2021-02-26 LAB — PROCALCITONIN: Procalcitonin: 1.16 ng/mL

## 2021-02-26 LAB — T4, FREE: Free T4: 1.3 ng/dL — ABNORMAL HIGH (ref 0.61–1.12)

## 2021-02-26 LAB — MAGNESIUM: Magnesium: 2.4 mg/dL (ref 1.7–2.4)

## 2021-02-26 MED ORDER — ATROPINE SULFATE 1 % OP SOLN
1.0000 [drp] | Freq: Three times a day (TID) | OPHTHALMIC | Status: DC
  Administered 2021-02-27 – 2021-03-03 (×9): 1 [drp] via OPHTHALMIC
  Filled 2021-02-26 (×2): qty 2

## 2021-02-26 MED ORDER — POTASSIUM CHLORIDE 10 MEQ/100ML IV SOLN
10.0000 meq | INTRAVENOUS | Status: AC
  Administered 2021-02-26 (×2): 10 meq via INTRAVENOUS
  Filled 2021-02-26: qty 100

## 2021-02-26 MED ORDER — ASPIRIN EC 81 MG PO TBEC
81.0000 mg | DELAYED_RELEASE_TABLET | Freq: Every day | ORAL | Status: DC
  Filled 2021-02-26: qty 1

## 2021-02-26 MED ORDER — HEPARIN SODIUM (PORCINE) 5000 UNIT/ML IJ SOLN
5000.0000 [IU] | Freq: Three times a day (TID) | INTRAMUSCULAR | Status: DC
  Administered 2021-02-26: 5000 [IU] via SUBCUTANEOUS
  Filled 2021-02-26 (×3): qty 1

## 2021-02-26 MED ORDER — POTASSIUM CHLORIDE CRYS ER 20 MEQ PO TBCR: 40.0000 meq | EXTENDED_RELEASE_TABLET | ORAL | Status: DC

## 2021-02-26 MED ORDER — POTASSIUM CHLORIDE CRYS ER 20 MEQ PO TBCR
40.0000 meq | EXTENDED_RELEASE_TABLET | ORAL | Status: DC
Start: 2021-02-26 — End: 2021-02-26
  Administered 2021-02-26 (×2): 40 meq via ORAL
  Filled 2021-02-26 (×3): qty 2

## 2021-02-26 MED ORDER — DORZOLAMIDE HCL-TIMOLOL MAL 2-0.5 % OP SOLN
1.0000 [drp] | Freq: Two times a day (BID) | OPHTHALMIC | Status: DC
  Administered 2021-02-26 – 2021-03-03 (×7): 1 [drp] via OPHTHALMIC
  Filled 2021-02-26 (×2): qty 10

## 2021-02-26 MED ORDER — NICOTINE 21 MG/24HR TD PT24
21.0000 mg | MEDICATED_PATCH | Freq: Every day | TRANSDERMAL | Status: DC
  Administered 2021-02-27: 21 mg via TRANSDERMAL
  Filled 2021-02-26 (×5): qty 1

## 2021-02-26 MED ORDER — ACETAMINOPHEN 650 MG RE SUPP
650.0000 mg | Freq: Four times a day (QID) | RECTAL | Status: DC | PRN
  Filled 2021-02-26: qty 1

## 2021-02-26 MED ORDER — FUROSEMIDE 10 MG/ML IJ SOLN
60.0000 mg | Freq: Once | INTRAMUSCULAR | Status: AC
  Administered 2021-02-26: 60 mg via INTRAVENOUS
  Filled 2021-02-26: qty 8

## 2021-02-26 MED ORDER — HYDRALAZINE HCL 20 MG/ML IJ SOLN
10.0000 mg | Freq: Four times a day (QID) | INTRAMUSCULAR | Status: DC | PRN
  Administered 2021-02-26 – 2021-02-27 (×2): 10 mg via INTRAVENOUS
  Filled 2021-02-26 (×3): qty 1

## 2021-02-26 MED ORDER — LOSARTAN POTASSIUM 50 MG PO TABS
100.0000 mg | ORAL_TABLET | Freq: Every day | ORAL | Status: DC
  Administered 2021-02-27 – 2021-03-03 (×5): 100 mg via ORAL
  Filled 2021-02-26 (×5): qty 2

## 2021-02-26 MED ORDER — POTASSIUM CHLORIDE 10 MEQ/100ML IV SOLN
10.0000 meq | INTRAVENOUS | Status: AC
  Administered 2021-02-26: 10 meq via INTRAVENOUS
  Filled 2021-02-26 (×2): qty 100

## 2021-02-26 MED ORDER — ROSUVASTATIN CALCIUM 10 MG PO TABS
10.0000 mg | ORAL_TABLET | Freq: Every day | ORAL | Status: DC
  Administered 2021-02-26 – 2021-03-03 (×6): 10 mg via ORAL
  Filled 2021-02-26 (×7): qty 1

## 2021-02-26 MED ORDER — ENOXAPARIN SODIUM 40 MG/0.4ML IJ SOSY
40.0000 mg | PREFILLED_SYRINGE | INTRAMUSCULAR | Status: DC
  Filled 2021-02-26 (×5): qty 0.4

## 2021-02-26 MED ORDER — PREDNISOLONE ACETATE 1 % OP SUSP: 1.0000 [drp] | Freq: Four times a day (QID) | OPHTHALMIC | Status: DC

## 2021-02-26 MED ORDER — BRIMONIDINE TARTRATE 0.2 % OP SOLN
1.0000 [drp] | Freq: Two times a day (BID) | OPHTHALMIC | Status: DC
  Administered 2021-02-26 – 2021-03-03 (×7): 1 [drp] via OPHTHALMIC
  Filled 2021-02-26 (×2): qty 5

## 2021-02-26 MED ORDER — ACETAMINOPHEN 325 MG PO TABS
650.0000 mg | ORAL_TABLET | Freq: Four times a day (QID) | ORAL | Status: DC | PRN
  Administered 2021-02-26: 650 mg via ORAL
  Filled 2021-02-26: qty 2

## 2021-02-26 MED ORDER — POTASSIUM CHLORIDE IN NACL 20-0.9 MEQ/L-% IV SOLN: Freq: Once | INTRAVENOUS | Status: DC

## 2021-02-26 MED ORDER — ASPIRIN EC 81 MG PO TBEC
81.0000 mg | DELAYED_RELEASE_TABLET | Freq: Every day | ORAL | Status: DC
  Administered 2021-02-26 – 2021-03-03 (×6): 81 mg via ORAL
  Filled 2021-02-26 (×6): qty 1

## 2021-02-26 MED ORDER — POTASSIUM CHLORIDE 10 MEQ/100ML IV SOLN
10.0000 meq | INTRAVENOUS | Status: AC
  Administered 2021-02-26 (×3): 10 meq via INTRAVENOUS
  Filled 2021-02-26 (×3): qty 100

## 2021-02-26 MED ORDER — CLOPIDOGREL BISULFATE 75 MG PO TABS
75.0000 mg | ORAL_TABLET | Freq: Every day | ORAL | Status: DC
  Administered 2021-02-27 – 2021-03-03 (×5): 75 mg via ORAL
  Filled 2021-02-26 (×5): qty 1

## 2021-02-26 MED ORDER — POTASSIUM CHLORIDE IN NACL 40-0.9 MEQ/L-% IV SOLN
INTRAVENOUS | Status: DC
  Filled 2021-02-26: qty 1000

## 2021-02-26 NOTE — ED Notes (Addendum)
Echo at bedside. Pt still currently sleeping. Will administer PO medication when pt awake

## 2021-02-26 NOTE — ED Notes (Addendum)
Admitting MD messaged in regard to recollect of CMP and K+ administration. Waiting for response at this time.

## 2021-02-26 NOTE — Progress Notes (Signed)
PROGRESS NOTE    Kristopher Hasse  TSV:[id number] DOB: 08-Jul-1945 DOA: 02/25/2021 PCP: Alba Cory, MD  [id number]   Assessment & Plan:   Principal Problem:   Syncope and collapse Active Problems:   Benign essential HTN   Vitamin B12 deficiency   Peripheral artery disease (HCC)   Tobacco use   DM (diabetes mellitus), type 2 with peripheral vascular complications (HCC)   AKI (acute kidney injury) (HCC)   Hypokalemia   Hypomagnesemia   Bruising   Prolonged QT interval   Bleu Francesconi is a 75 y.o. female wiith hx of blindness in the right eye, diabetes mellitus type 2, DVT, glaucoma, hypertension, depression, tobacco abuse who presented with complaints of a fall.  patient reports that she has difficulty moving her left leg after the fall.  she also reports hitting her head but did not pass out.  Patient started to develop shingles 3 months ago on her right lower back with pain radiating to her left buttock with similar episode few months ago.  She is not bedridden but has chronic urine incontinence and diarrhea.  pt also describes weakness and passing out.   # Severe hypokalemia --K+ <2.0 on presentation. --started on IV potassium and NS with Kcl 40 mEq/L @ 250 ml/hr in the ED. Plan: --d/c MIVF --replete potassium with oral potassium 40 mEq q4h until K+ normalizes  --recheck potassium level this pm  # Weakness and fall  --likely 2/2 severe hypokalemia --PT when potassium normalizes  # Hypomag --mag 1.5 on presentation --replete with IV mag  # Syncope and collapse --likely due to electrolytes abnormalities. --MRI brain neg acute finding  # AKI --Cr 1.67 on presentation.  Baseline ~0.8. --AKI resolved the next day after IVF. Plan: --d/c IVF  # Painful rash or denuded skin --painful patches of skin under left buttock and left thigh.  Unclear etiology.  Given reported chronic urine and stool incontinence, could be a cause for skin breakdown. Plan: --wound care  consult  Diabetes mellitus type 2 --A1c 6.2.  No need for BG checks.  Hypertension: Blood pressure low normal on presentation.  BP meds not started on admission. Plan: --resume losartan  B12 deficiency: --resume home B12 supplement after discharge.   Peripheral arterial disease: Stable patient being followed by vascular specialist. --resume ASA and plavix   Tobacco abuse: Nicotine patch.    Glaucoma --cont home eye drops   DVT prophylaxis: Lovenox SQ Code Status: Full code  Family Communication: family updated at bedside  Level of care: Progressive Cardiac Dispo:   The patient is from: home Anticipated d/c is to: undetermied Anticipated d/c date is: 2-3 days Patient currently is not medically ready to d/c due to: severe hypokalemia causing weakness and falls   Subjective and Interval History:  Pt complained mostly of painful skin under left thigh that pt said was shingles.  Pt reported having a hard time moving her legs, last normal was Friday (3 days prior to presentation).     Objective: Vitals:   02/26/21 1630 02/26/21 1930 02/26/21 1950 02/26/21 2000  BP: (!) 143/70 (!) 202/72 (!) 205/88 (!) 182/64  Pulse: 67 70  71  Resp: 16 (!) 22  (!) 28  Temp:      TempSrc:      SpO2: 100% 94%  93%    Intake/Output Summary (Last 24 hours) at 02/26/2021 2228 Last data filed at 02/26/2021 0555 Gross per 24 hour  Intake 2100 ml  Output --  Net 2100 ml  There were no vitals filed for this visit.  Examination:   Constitutional: NAD, AAOx3 HEENT: conjunctivae and lids normal, EOMI CV: No cyanosis.   RESP: normal respiratory effort, on RA Extremities: No effusions, edema in BLE SKIN: warm, dry.  Patches of skin under left thigh that were tender to palpation.  No obvious blisters. Neuro: II - XII grossly intact.   Psych: grouchy mood and affect.     Data Reviewed: I have personally reviewed following labs and imaging studies  CBC: Recent Labs  Lab  02/20/21 1442 02/25/21 2238 02/26/21 0517  WBC 4.4 11.0* 8.2  NEUTROABS 3,260 9.5*  --   HGB 10.9* 10.6* 10.2*  HCT 33.6* 31.1* 29.7*  MCV 87.0 83.2 81.6  PLT 207 246 230   Basic Metabolic Panel: Recent Labs  Lab 02/20/21 1442 02/25/21 2238 02/26/21 0015 02/26/21 0517 02/26/21 1918  NA 142 134*  --  139 138  K 2.0* <2.0*  --  <2.0* 2.5*  CL 92* 86*  --  95* 95*  CO2 40* 35*  --  35* 33*  GLUCOSE 148* 173*  --  165* 197*  BUN 12 30*  --  24* 18  CREATININE 0.84 1.67*  --  1.16* 0.87  CALCIUM 8.5* 7.8*  --  7.5* 7.7*  MG 1.5 1.5*  --   --  2.4  PHOS  --   --  3.3  --   --    GFR: Estimated Creatinine Clearance: 48.2 mL/min (by C-G formula based on SCr of 0.87 mg/dL). Liver Function Tests: Recent Labs  Lab 02/20/21 1442 02/25/21 2238 02/26/21 0517  AST 19 35 31  ALT 12 18 17   ALKPHOS  --  59 51  BILITOT 0.9 1.1 1.1  PROT 5.9* 6.3* 5.3*  ALBUMIN  --  3.1* 2.6*   No results for input(s): LIPASE, AMYLASE in the last 168 hours. No results for input(s): AMMONIA in the last 168 hours. Coagulation Profile: Recent Labs  Lab 02/25/21 2238 02/26/21 0517  INR 1.0 1.0   Cardiac Enzymes: No results for input(s): CKTOTAL, CKMB, CKMBINDEX, TROPONINI in the last 168 hours. BNP (last 3 results) No results for input(s): PROBNP in the last 8760 hours. HbA1C: No results for input(s): HGBA1C in the last 72 hours. CBG: No results for input(s): GLUCAP in the last 168 hours. Lipid Profile: No results for input(s): CHOL, HDL, LDLCALC, TRIG, CHOLHDL, LDLDIRECT in the last 72 hours. Thyroid Function Tests: Recent Labs    02/26/21 0027  TSH 7.867*  FREET4 1.30*   Anemia Panel: No results for input(s): VITAMINB12, FOLATE, FERRITIN, TIBC, IRON, RETICCTPCT in the last 72 hours. Sepsis Labs: Recent Labs  Lab 02/25/21 2238  PROCALCITON 1.16    Recent Results (from the past 240 hour(s))  Resp Panel by RT-PCR (Flu A&B, Covid) Nasopharyngeal Swab     Status: None    Collection Time: 02/26/21 12:08 AM   Specimen: Nasopharyngeal Swab; Nasopharyngeal(NP) swabs in vial transport medium  Result Value Ref Range Status   SARS Coronavirus 2 by RT PCR NEGATIVE NEGATIVE Final    Comment: (NOTE) SARS-CoV-2 target nucleic acids are NOT DETECTED.  The SARS-CoV-2 RNA is generally detectable in upper respiratory specimens during the acute phase of infection. The lowest concentration of SARS-CoV-2 viral copies this assay can detect is 138 copies/mL. A negative result does not preclude SARS-Cov-2 infection and should not be used as the sole basis for treatment or other patient management decisions. A negative result may occur with  improper specimen collection/handling, submission of specimen other than nasopharyngeal swab, presence of viral mutation(s) within the areas targeted by this assay, and inadequate number of viral copies(<138 copies/mL). A negative result must be combined with clinical observations, patient history, and epidemiological information. The expected result is Negative.  Fact Sheet for Patients:  BloggerCourse.com  Fact Sheet for Healthcare Providers:  SeriousBroker.it  This test is no t yet approved or cleared by the [place] FDA and  has been authorized for detection and/or diagnosis of SARS-CoV-2 by FDA under an Emergency Use Authorization (EUA). This EUA will remain  in effect (meaning this test can be used) for the duration of the COVID-19 declaration under Section 564(b)(1) of the Act, 21 U.S.C.section 360bbb-3(b)(1), unless the authorization is terminated  or revoked sooner.       Influenza A by PCR NEGATIVE NEGATIVE Final   Influenza B by PCR NEGATIVE NEGATIVE Final    Comment: (NOTE) The Xpert Xpress SARS-CoV-2/FLU/RSV plus assay is intended as an aid in the diagnosis of influenza from Nasopharyngeal swab specimens and should not be used as a sole basis for treatment.  Nasal washings and aspirates are unacceptable for Xpert Xpress SARS-CoV-2/FLU/RSV testing.  Fact Sheet for Patients: BloggerCourse.com  Fact Sheet for Healthcare Providers: SeriousBroker.it  This test is not yet approved or cleared by the [place] FDA and has been authorized for detection and/or diagnosis of SARS-CoV-2 by FDA under an Emergency Use Authorization (EUA). This EUA will remain in effect (meaning this test can be used) for the duration of the COVID-19 declaration under Section 564(b)(1) of the Act, 21 U.S.C. section 360bbb-3(b)(1), unless the authorization is terminated or revoked.  Performed at [facility], [address] [place], [place] [address]   CULTURE, BLOOD (ROUTINE X 2) w Reflex to ID Panel     Status: None (Preliminary result)   Collection Time: 02/26/21 12:08 AM   Specimen: BLOOD  Result Value Ref Range Status   Specimen Description BLOOD RIGHT ASSIST CONTROL  Final   Special Requests   Final    BOTTLES DRAWN AEROBIC AND ANAEROBIC Blood Culture adequate volume   Culture   Final    NO GROWTH < 12 HOURS Performed at [facility], [address], [place], [place] [address]    Report Status PENDING  Incomplete  CULTURE, BLOOD (ROUTINE X 2) w Reflex to ID Panel     Status: None (Preliminary result)   Collection Time: 02/26/21 12:27 AM   Specimen: BLOOD  Result Value Ref Range Status   Specimen Description BLOOD LEFT ASSIST CONTROL  Final   Special Requests   Final    BOTTLES DRAWN AEROBIC AND ANAEROBIC Blood Culture results may not be optimal due to an inadequate volume of blood received in culture bottles   Culture   Final    NO GROWTH < 12 HOURS Performed at [facility], [address], [place], [place] [address]    Report Status PENDING  Incomplete      Radiology Studies: DG Chest 2 View  Result Date: 02/25/2021 CLINICAL DATA:  Fall EXAM: CHEST - 2 VIEW  COMPARISON:  09/07/2009 FINDINGS: Mild cardiomegaly. Aortic atherosclerosis. Focal area of relatively increased density at the left lung base on frontal view is favored to be secondary to the overlapping anterior left sixth and posterior left ninth ribs. No focal airspace consolidation, pleural effusion, or pneumothorax. No acute bony findings. IMPRESSION: No active cardiopulmonary disease. Electronically Signed   By: Duanne Guess D.O.  On: 02/25/2021 21:42   CT HEAD WO CONTRAST ( )  Result Date: 02/25/2021 CLINICAL DATA:  Head trauma, minor (Age >= 65y) EXAM: CT HEAD WITHOUT CONTRAST TECHNIQUE: Contiguous axial images were obtained from the base of the skull through the vertex without intravenous contrast. COMPARISON:  12/04/2006 FINDINGS: Brain: No evidence of acute infarction, hemorrhage, hydrocephalus, extra-axial collection or mass lesion/mass effect. Unchanged mineralization noted within the basal ganglia bilaterally. Scattered low-density changes within the periventricular and subcortical white matter compatible with chronic microvascular ischemic change. Mild diffuse cerebral volume loss. Vascular: Atherosclerotic calcifications involving the large vessels of the skull base. No unexpected hyperdense vessel. Skull: Normal. Negative for fracture or focal lesion. Sinuses/Orbits: Visualized paranasal sinuses and mastoid air cells are clear. Right globe prosthesis. Other: Negative for scalp hematoma. IMPRESSION: 1. No acute intracranial findings. 2. Mild chronic microvascular ischemic change and cerebral volume loss. Electronically Signed   By: Duanne Guess D.O.   On: 02/25/2021 21:54   CT Cervical Spine Wo Contrast  Result Date: 02/25/2021 CLINICAL DATA:  Neck trauma (Age >= 65y) EXAM: CT CERVICAL SPINE WITHOUT CONTRAST TECHNIQUE: Multidetector CT imaging of the cervical spine was performed without intravenous contrast. Multiplanar CT image reconstructions were also generated. COMPARISON:   None. FINDINGS: Alignment: Facet joints are aligned without dislocation or traumatic listhesis. Dens and lateral masses are aligned. Straightening of the cervical lordosis with trace retrolisthesis at C5-6. Skull base and vertebrae: No acute fracture. No primary bone lesion or focal pathologic process. Soft tissues and spinal canal: No prevertebral fluid or swelling. No visible canal hematoma. Disc levels: Degenerative disc disease of C5-6. Facet arthropathy is most notable on the right at C7-T1. Upper chest: Included lung apices are clear. Other: Atherosclerotic calcifications of the visualized aortic arch and branch vessels as well as the bilateral carotid arteries. IMPRESSION: 1. No acute fracture or traumatic listhesis of the cervical spine. 2. Degenerative disc disease of C5-6. Aortic Atherosclerosis (ICD10-I70.0). Electronically Signed   By: Duanne Guess D.O.   On: 02/25/2021 21:57   MR BRAIN WO CONTRAST  Result Date: 02/26/2021 CLINICAL DATA:  75 year old female status post syncope and fall. Left leg weakness. Altered mental status. EXAM: MRI HEAD WITHOUT CONTRAST TECHNIQUE: Multiplanar, multiecho pulse sequences of the brain and surrounding structures were obtained without intravenous contrast. COMPARISON:  Head CT 02/25/2021. FINDINGS: Brain: No restricted diffusion to suggest acute infarction. No midline shift, mass effect, evidence of mass lesion, ventriculomegaly, extra-axial collection or acute intracranial hemorrhage. Cervicomedullary junction and pituitary are within normal limits. Chronic microhemorrhage in the left parietal lobe with mild DWI susceptibility (series 8, image 18). No cortical encephalomalacia identified. No other chronic cerebral blood products identified. Minimal to mild for age nonspecific mostly periventricular cerebral white matter T2 and FLAIR hyperintensity. Patchy mild to moderate T2 heterogeneity in the pons. Deep gray matter nuclei and cerebellum appear normal for  age. Vascular: Major intracranial vascular flow voids are preserved. Tortuous cavernous ICAs. Skull and upper cervical spine: Negative aside from degenerative ligamentous hypertrophy about the odontoid. Normal visible bone marrow signal. Sinuses/Orbits: Postoperative changes to the right globe. Otherwise negative orbits. Paranasal sinuses and mastoids are stable and well aerated. Other: Grossly normal visible internal auditory structures. Negative visible scalp and face. IMPRESSION: 1. No acute intracranial abnormality. 2. Mild to moderate for age nonspecific signal changes in the brain, including a solitary chronic microhemorrhage in the left parietal lobe. Favor chronic small vessel disease. Electronically Signed   By: Odessa Fleming M.D.   On: 02/26/2021 06:58  US Carotid Bilateral  Result Date: 02/26/2021 CLINICAL DATA:  Syncope and collapse. History of hypertension, hyperlipidemia, diabetes and smoking. EXAM: BILATERAL CAROTID DUPLEX ULTRASOUND TECHNIQUE: Wallace Cullens scale imaging, color Doppler and duplex ultrasound were performed of bilateral carotid and vertebral arteries in the neck. COMPARISON:  None. FINDINGS: Criteria: Quantification of carotid stenosis is based on velocity parameters that correlate the residual internal carotid diameter with NASCET-based stenosis levels, using the diameter of the distal internal carotid lumen as the denominator for stenosis measurement. The following velocity measurements were obtained: RIGHT ICA: 91/10 cm/sec CCA: 79/7 cm/sec SYSTOLIC ICA/CCA RATIO:  1.2 ECA: 222 cm/sec LEFT ICA: 124/13 cm/sec CCA: 66/6 cm/sec SYSTOLIC ICA/CCA RATIO:  1.9 ECA: 208 cm/sec RIGHT CAROTID ARTERY: Scattered eccentric echogenic plaque throughout the right common carotid artery (images 8, 11 and 12). There is a large amount of eccentric echogenic plaque within the right carotid bulb (image 15 and 17), extending to involve the origin and proximal aspects of the right internal carotid artery (image 27),  morphologically resulting in 50% luminal narrowing though not resulting in elevated peak systolic velocities within the interrogated course the right internal carotid artery. RIGHT VERTEBRAL ARTERY:  Antegrade Flow LEFT CAROTID ARTERY: There is a large amount of eccentric echogenic plaque scattered throughout the left common carotid artery (images 38, 39, 42). There is a large amount of eccentric echogenic plaque within left carotid bulb (image 51 and 53), extending to involve the origin and proximal aspects of the left internal carotid artery (image 62), morphologically resulting in 50% luminal narrowing and borderline elevated peak systolic velocity within mid aspect the left internal carotid artery. Greatest acquired peak systolic velocity within mid left ICA measures 124 centimeters/second (image 67). LEFT VERTEBRAL ARTERY:  Antegrade flow IMPRESSION: 1. Large amount of left-sided atherosclerotic plaque morphologically results in 50% luminal narrowing and borderline elevated peak systolic velocities within the left internal carotid artery. Further evaluation with CTA could be performed as clinically indicated. 2. Large amount of right-sided atherosclerotic plaque morphologically results in at least 50% luminal narrowing though does not definitely resulting in elevated peak systolic velocities within the interrogated course of the right internal carotid artery. Further evaluation with CTA could be performed as indicated. 3. Antegrade flow demonstrated within the bilateral vertebral arteries. Electronically Signed   By: Simonne Come M.D.   On: 02/26/2021 11:38   DG Chest Port 1 View  Result Date: 02/26/2021 CLINICAL DATA:  Shortness of breath. EXAM: PORTABLE CHEST 1 VIEW COMPARISON:  February 25, 2021 FINDINGS: Mild, diffusely increased interstitial lung markings are seen. This is mildly increased in severity when compared to the prior study. Very mild areas of atelectasis are noted within the bilateral lung  bases. There is no evidence of a pleural effusion or pneumothorax. Mild to moderate severity cardiac silhouette enlargement is seen. There is marked severity calcification of the thoracic aorta. Degenerative changes are noted throughout the thoracic spine. IMPRESSION: 1. Mild diffuse interstitial lung disease with a mild amount of superimposed interstitial edema. Electronically Signed   By: Aram Candela M.D.   On: 02/26/2021 21:11   ECHOCARDIOGRAM COMPLETE  Result Date: 02/26/2021    ECHOCARDIOGRAM REPORT   Patient Name:   TREINA ARSCOTT Date of Exam: 02/26/2021 Medical Rec #:  [id number]      Height:       64.0 in Accession #:    [id number]     Weight:       137.0 lb Date of Birth:  01-21-1946  BSA:          1.666 m Patient Age:    75 years       BP:           156/85 mmHg Patient Gender: F              HR:           66 bpm. Exam Location:  [facility] Procedure: 2D Echo, Cardiac Doppler and Color Doppler Indications:     Syncope R55  History:         Patient has no prior history of Echocardiogram examinations.                  Risk Factors:Diabetes and Hypertension. DVT.  Sonographer:     Cristela Blue Referring Phys:  [id number] EKTA V PATEL Diagnosing Phys: Sena Slate IMPRESSIONS  1. Left ventricular ejection fraction, by estimation, is 60 to 65%. The left ventricle has normal function. The left ventricle has no regional wall motion abnormalities. There is mild left ventricular hypertrophy. Left ventricular diastolic parameters are consistent with Grade II diastolic dysfunction (pseudonormalization).  2. Right ventricular systolic function was not well visualized. The right ventricular size is not well visualized.  3. Left atrial size was moderately dilated.  4. Right atrial size was mildly dilated.  5. Trivial effusion versus pericardial fat pad.  6. The mitral valve is degenerative. No evidence of mitral valve regurgitation. No evidence of mitral stenosis. Moderate mitral annular calcification.  7. The aortic  valve is tricuspid. Aortic valve regurgitation is not visualized. Mild aortic valve sclerosis is present, with no evidence of aortic valve stenosis. FINDINGS  Left Ventricle: Left ventricular ejection fraction, by estimation, is 60 to 65%. The left ventricle has normal function. The left ventricle has no regional wall motion abnormalities. The left ventricular internal cavity size was normal in size. There is  mild left ventricular hypertrophy. Left ventricular diastolic parameters are consistent with Grade II diastolic dysfunction (pseudonormalization). Right Ventricle: The right ventricular size is not well visualized. Right vetricular wall thickness was not well visualized. Right ventricular systolic function was not well visualized. Left Atrium: Left atrial size was moderately dilated. Right Atrium: Right atrial size was mildly dilated. Pericardium: Trivial effusion versus pericardial fat pad. Mitral Valve: The mitral valve is degenerative in appearance. Moderate mitral annular calcification. No evidence of mitral valve regurgitation. No evidence of mitral valve stenosis. MV peak gradient, 6.2 mmHg. The mean mitral valve gradient is 3.0 mmHg. Tricuspid Valve: The tricuspid valve is normal in structure. Tricuspid valve regurgitation is trivial. Aortic Valve: The aortic valve is tricuspid. Aortic valve regurgitation is not visualized. Mild aortic valve sclerosis is present, with no evidence of aortic valve stenosis. Aortic valve mean gradient measures 3.5 mmHg. Aortic valve peak gradient measures 5.7 mmHg. Aortic valve area, by VTI measures 4.83 cm. Pulmonic Valve: The pulmonic valve was grossly normal. Pulmonic valve regurgitation is not visualized. No evidence of pulmonic stenosis. Aorta: The aortic root is normal in size and structure. Venous: The inferior vena cava was not well visualized. IAS/Shunts: No atrial level shunt detected by color flow Doppler.  LEFT VENTRICLE PLAX 2D LVIDd:         5.10 cm   Diastology LVIDs:         2.60 cm  LV e' medial:    3.15 cm/s LV PW:         1.40 cm  LV E/e' medial:  37.1 LV IVS:  1.20 cm  LV e' lateral:   4.46 cm/s LVOT diam:     2.00 cm  LV E/e' lateral: 26.2 LV SV:         117 LV SV Index:   70 LVOT Area:     3.14 cm  RIGHT VENTRICLE RV S prime:     11.70 cm/s LEFT ATRIUM             Index       RIGHT ATRIUM           Index LA diam:        4.50 cm 2.70 cm/m  RA Area:     19.40 cm LA Vol (A2C):   93.9 ml 56.37 ml/m RA Volume:   54.60 ml  32.78 ml/m LA Vol (A4C):   64.6 ml 38.78 ml/m LA Biplane Vol: 78.1 ml 46.88 ml/m  AORTIC VALVE                    PULMONIC VALVE AV Area (Vmax):    4.36 cm     PV Vmax:        0.85 m/s AV Area (Vmean):   3.96 cm     PV Peak grad:   2.9 mmHg AV Area (VTI):     4.83 cm     RVOT Peak grad: 4 mmHg AV Vmax:           119.00 cm/s AV Vmean:          85.600 cm/s AV VTI:            0.242 m AV Peak Grad:      5.7 mmHg AV Mean Grad:      3.5 mmHg LVOT Vmax:         165.00 cm/s LVOT Vmean:        108.000 cm/s LVOT VTI:          0.373 m LVOT/AV VTI ratio: 1.54  AORTA Ao Root diam: 2.70 cm MITRAL VALVE                TRICUSPID VALVE MV Area (PHT): 2.87 cm     TR Peak grad:   9.4 mmHg MV Area VTI:   3.17 cm     TR Vmax:        153.00 cm/s MV Peak grad:  6.2 mmHg MV Mean grad:  3.0 mmHg     SHUNTS MV Vmax:       1.25 m/s     Systemic VTI:  0.37 m MV Vmean:      72.2 cm/s    Systemic Diam: 2.00 cm MV Decel Time: 264 msec MV E velocity: 117.00 cm/s MV A velocity: 113.00 cm/s MV E/A ratio:  1.04 Sena Slate Electronically signed by Sena Slate Signature Date/Time: 02/26/2021/5:11:58 PM    Final      Scheduled Meds:  aspirin EC  81 mg Oral Daily   brimonidine  1 drop Both Eyes BID   dorzolamide-timolol  1 drop Both Eyes BID   furosemide  60 mg Intravenous Once   heparin  5,000 Units Subcutaneous Q8H   nicotine  21 mg Transdermal Daily   potassium chloride  40 mEq Oral Q4H   rosuvastatin  10 mg Oral Daily   Continuous Infusions:    LOS: 0 days     Darlin Priestly, MD [facility] If 7PM-7AM, please contact night-coverage 02/26/2021, 10:28 PM

## 2021-02-26 NOTE — Progress Notes (Signed)
*  PRELIMINARY RESULTS* Echocardiogram 2D Echocardiogram has been performed.  Lisa [name] 02/26/2021, 1:29 PM

## 2021-02-26 NOTE — ED Notes (Signed)
RN at bedside. Pt wanting to continue to sleep and refusing medication administration at this time. Will attempt at later time

## 2021-02-26 NOTE — ED Notes (Signed)
Phone given to pt to talk to family.

## 2021-02-26 NOTE — ED Notes (Signed)
Entered room to assess patient and introduce self as primary nurse. Pt very upset and stating that "nobody is listening to me, I came in here for a fall and now I'm getting all this stuff and I don't know what it is." She then stated that everything we are doing for her here is making her worse. She came in for a fall and now she is having trouble breathing. "I never have trouble breathing, you guys are making me worse". Dr Mervyn Skeeters.

## 2021-02-26 NOTE — ED Notes (Signed)
Report given to Austin RN.

## 2021-02-26 NOTE — ED Notes (Signed)
Patient transported to MRI 

## 2021-02-26 NOTE — Telephone Encounter (Signed)
Patient is currently admitted to the hospital. Will follow up upon discharge. Dr.Sowles aware and monitoring.

## 2021-02-27 LAB — CBC
HCT: 33.5 % — ABNORMAL LOW (ref 36.0–46.0)
Hemoglobin: 11.3 g/dL — ABNORMAL LOW (ref 12.0–15.0)
MCH: 28.8 pg (ref 26.0–34.0)
MCHC: 33.7 g/dL (ref 30.0–36.0)
MCV: 85.5 fL (ref 80.0–100.0)
Platelets: 276 10*3/uL (ref 150–400)
RBC: 3.92 MIL/uL (ref 3.87–5.11)
RDW: 17.1 % — ABNORMAL HIGH (ref 11.5–15.5)
WBC: 8.5 10*3/uL (ref 4.0–10.5)
nRBC: 0 % (ref 0.0–0.2)

## 2021-02-27 LAB — PHOSPHORUS: Phosphorus: 2.1 mg/dL — ABNORMAL LOW (ref 2.5–4.6)

## 2021-02-27 LAB — BASIC METABOLIC PANEL
Anion gap: 12 (ref 5–15)
BUN: 16 mg/dL (ref 8–23)
CO2: 34 mmol/L — ABNORMAL HIGH (ref 22–32)
Calcium: 8 mg/dL — ABNORMAL LOW (ref 8.9–10.3)
Chloride: 95 mmol/L — ABNORMAL LOW (ref 98–111)
Creatinine, Ser: 0.83 mg/dL (ref 0.44–1.00)
GFR, Estimated: >60 mL/min (ref >60)
Glucose, Bld: 155 mg/dL — ABNORMAL HIGH (ref 70–99)
Potassium: 2.8 mmol/L — ABNORMAL LOW (ref 3.5–5.1)
Sodium: 141 mmol/L (ref 135–145)

## 2021-02-27 LAB — MAGNESIUM: Magnesium: 2.2 mg/dL (ref 1.7–2.4)

## 2021-02-27 LAB — TROPONIN I (HIGH SENSITIVITY): Troponin I (High Sensitivity): 46 ng/L — ABNORMAL HIGH (ref <18)

## 2021-02-27 LAB — GLUCOSE, CAPILLARY: Glucose-Capillary: 207 mg/dL — ABNORMAL HIGH (ref 70–99)

## 2021-02-27 LAB — D-DIMER, QUANTITATIVE: D-Dimer, Quant: 2.45 ug/mL-FEU — ABNORMAL HIGH (ref 0.00–0.50)

## 2021-02-27 LAB — BRAIN NATRIURETIC PEPTIDE: B Natriuretic Peptide: 496.3 pg/mL — ABNORMAL HIGH (ref 0.0–100.0)

## 2021-02-27 MED ORDER — AMLODIPINE BESYLATE 5 MG PO TABS
5.0000 mg | ORAL_TABLET | Freq: Every day | ORAL | Status: DC
  Administered 2021-02-27: 5 mg via ORAL
  Filled 2021-02-27: qty 1

## 2021-02-27 MED ORDER — GABAPENTIN 300 MG PO CAPS
300.0000 mg | ORAL_CAPSULE | Freq: Two times a day (BID) | ORAL | Status: DC
  Administered 2021-02-28 – 2021-03-01 (×2): 300 mg via ORAL
  Filled 2021-02-27 (×4): qty 1

## 2021-02-27 MED ORDER — ZINC OXIDE 40 % EX OINT
TOPICAL_OINTMENT | Freq: Two times a day (BID) | CUTANEOUS | Status: DC
  Filled 2021-02-27 (×2): qty 113

## 2021-02-27 MED ORDER — HYDRALAZINE HCL 20 MG/ML IJ SOLN
10.0000 mg | INTRAMUSCULAR | Status: DC | PRN
  Administered 2021-03-02: 10 mg via INTRAVENOUS
  Filled 2021-02-27: qty 1

## 2021-02-27 MED ORDER — POTASSIUM CHLORIDE CRYS ER 20 MEQ PO TBCR
40.0000 meq | EXTENDED_RELEASE_TABLET | ORAL | Status: AC
  Administered 2021-02-27 (×3): 40 meq via ORAL
  Filled 2021-02-27 (×4): qty 2

## 2021-02-27 MED ORDER — CITALOPRAM HYDROBROMIDE 20 MG PO TABS
20.0000 mg | ORAL_TABLET | Freq: Every day | ORAL | Status: DC
  Administered 2021-02-28 – 2021-03-03 (×4): 20 mg via ORAL
  Filled 2021-02-27 (×4): qty 1

## 2021-02-27 NOTE — Evaluation (Signed)
Occupational Therapy Evaluation Patient Details Name: Lisa [name] MRN: [id number] DOB: [birth year] Today's Date: 02/27/2021   History of Present Illness Lisa [name] is a 75 y.o. female wiith hx of blindness in the right eye, diabetes mellitus type 2, DVT, glaucoma, hypertension, depression, LLE transmetatarsal amputation, and tobacco abuse who presented with complaints of a fall.   Clinical Impression   Lisa [name] was seen for OT evaluation this date. Pt presents with flat affect and does not answer all questions re: PLOF/home setup. Upon arrival pt found to have full cup of pills on tray table, RN notified and enters room to administer. Pt takes significantly increased time sup>sit however no physical assist needed. Pt requires SETUP + SUPERVISION seated self-drinking tasks - unable to trial standing ADL tasks 2/2 high BP. MAX A don B socks seated EOB - pt refuses to attempt to don herself however likely could perform with increased participation.   NT in room to complete vitals, pt found to have seated BP 145/112, MAP 121 - repeated sitting on alternate arm BP 201/81, MAP 114. Returned to supine BP 216/86, MAP 121. Deferred OOB mobility/ADL assessment in setting of high BP. NT notified RN. Pt would benefit from skilled OT to address noted impairments to maximize safety and independence while minimizing falls risk and caregiver burden. Upon hospital discharge, recommendation pending participation in OOB mobility/ADL assessment.        Recommendations for follow up therapy are one component of a multi-disciplinary discharge planning process, led by the attending physician.  Recommendations may be updated based on patient status, additional functional criteria and insurance authorization.   Follow Up Recommendations  Other (comment) (pending participation in OOB/ADL assessment)    Equipment Recommendations  3 in 1 bedside commode    Recommendations for Other Services       Precautions  / Restrictions Precautions Precautions: Fall Restrictions Weight Bearing Restrictions: No      Mobility Bed Mobility Overal bed mobility: Needs Assistance Bed Mobility: Supine to Sit;Sit to Supine     Supine to sit: Supervision Sit to supine: Supervision   General bed mobility comments: increased time to complete    Transfers                 General transfer comment: deferred 2/2 elevated BP    Balance Overall balance assessment: Needs assistance Sitting-balance support: No upper extremity supported;Feet supported Sitting balance-Leahy Scale: Good                                     ADL either performed or assessed with clinical judgement   ADL Overall ADL's : Needs assistance/impaired                                       General ADL Comments: MAX A don B socks seated EOB - pt refuses to attempt to don herself however likely could perform with increased participation. SETUP + SUPERVISION seated grooming tasks - unable to trial standing grooming 2/2 high BP seated and supine.     Vision Baseline Vision/History: 3 Glaucoma Patient Visual Report: No change from baseline              Pertinent Vitals/Pain Pain Assessment: Faces Faces Pain Scale: Hurts a little bit Pain Location: back Pain Descriptors / Indicators: Discomfort;Dull Pain Intervention(s): Limited activity  within patient's tolerance;Repositioned     Hand Dominance     Extremity/Trunk Assessment Upper Extremity Assessment Upper Extremity Assessment: Generalized weakness   Lower Extremity Assessment Lower Extremity Assessment: Generalized weakness       Communication Communication Communication: No difficulties   Cognition Arousal/Alertness: Awake/alert Behavior During Therapy: Flat affect Overall Cognitive Status: Within Functional Limits for tasks assessed                                 General Comments: Pt is slow to respond to  questions and does not answer all questions asked. Perseverative on getting a hair scarf   General Comments       Exercises Exercises: Other exercises Other Exercises Other Exercises: Pt educated re: OT role, DME recs, d/c recs, falls prevention, ECS Other Exercises: LBD, self-drinking, sup<>sit, sitting balance/tolerance   Shoulder Instructions      Home Living Family/patient expects to be discharged to:: Private residence Living Arrangements: Alone Available Help at Discharge: Family;Available PRN/intermittently                                    Prior Functioning/Environment Level of Independence: Independent        Comments: Pt reports no assist for ADLs or mobility at baseline        OT Problem List: Decreased activity tolerance;Impaired balance (sitting and/or standing);Decreased safety awareness;Decreased strength      OT Treatment/Interventions: Self-care/ADL training;Therapeutic exercise;DME and/or AE instruction;Energy conservation;Therapeutic activities;Patient/family education;Balance training    OT Goals(Current goals can be found in the care plan section) Acute Rehab OT Goals Patient Stated Goal: to go home OT Goal Formulation: With patient Time For Goal Achievement: 03/13/21 Potential to Achieve Goals: Good ADL Goals Pt Will Perform Grooming: with modified independence;sitting Pt Will Perform Lower Body Dressing: sitting/lateral leans;with set-up;with supervision Pt Will Transfer to Toilet: with min guard assist;stand pivot transfer;bedside commode (c LRAD PRN)  OT Frequency: Min 2X/week    AM-PAC OT "6 Clicks" Daily Activity     Outcome Measure Help from another person eating meals?: None Help from another person taking care of personal grooming?: A Little Help from another person toileting, which includes using toliet, bedpan, or urinal?: A Little Help from another person bathing (including washing, rinsing, drying)?: A Little Help  from another person to put on and taking off regular upper body clothing?: None Help from another person to put on and taking off regular lower body clothing?: A Lot 6 Click Score: 19   End of Session Nurse Communication: Mobility status  Activity Tolerance: Patient tolerated treatment well Patient left: in bed;with call bell/phone within reach;with bed alarm set  OT Visit Diagnosis: Other abnormalities of gait and mobility (R26.89);Muscle weakness (generalized) (M62.81)                Time: 2951-8841 OT Time Calculation (min): 31 min Charges:  OT General Charges $OT Visit: 1 Visit OT Evaluation $OT Eval Moderate Complexity: 1 Mod OT Treatments $Self Care/Home Management : 8-22 mins  Kathie Dike, M.S. OTR/L  02/27/21, 2:41 PM  ascom [phone]

## 2021-02-27 NOTE — Progress Notes (Signed)
Mobility Specialist - Progress Note   02/27/21 1600  Mobility  Activity Contraindicated/medical hold  Mobility performed by Mobility specialist    Per chart review, elevated BP limiting pt ability to participate in mobility session. Will attempt another date/time as medically appropriate.    Filiberto Pinks Mobility Specialist 02/27/21, 4:09 PM

## 2021-02-27 NOTE — Progress Notes (Signed)
PROGRESS NOTE    Lisa [name]  EHM:[id number] DOB: [birth year] DOA: 02/25/2021 PCP: Alba Cory, MD    Brief Narrative:  75 y.o. female wiith hx of blindness in the right eye, diabetes mellitus type 2, DVT, glaucoma, hypertension, depression, tobacco abuse who presented with complaints of a fall.   patient reports that she has difficulty moving her left leg after the fall.  she also reports hitting her head but did not pass out.  Patient started to develop shingles 3 months ago on her right lower back with pain radiating to her left buttock with similar episode few months ago.  She is not bedridden but has chronic urine incontinence and diarrhea.  pt also describes weakness and passing out.  Etiology of hypokalemia is unclear.  Aggressive replacement via IV at first.  However patient developed signs of fluid overload and intravenous fluid resuscitation was discontinued.   Assessment & Plan:   Principal Problem:   Syncope and collapse Active Problems:   Benign essential HTN   Vitamin B12 deficiency   Peripheral artery disease (HCC)   Tobacco use   DM (diabetes mellitus), type 2 with peripheral vascular complications (HCC)   AKI (acute kidney injury) (HCC)   Hypokalemia   Hypomagnesemia   Bruising   Prolonged QT interval  Severe hypokalemia --K+ <2.0 on presentation. --started on IV potassium and NS with Kcl 40 mEq/L @ 250 ml/hr in the ED. Developed clinical signs of fluid overload Potassium improving, not in reference range yet Plan: Aggressive oral potassium replacement Recheck potassium in a.m. Possible discharge in a.m. if potassium normalizes    # Weakness and fall  --likely 2/2 severe hypokalemia --PT when potassium normalizes   # Hypomag --mag 1.5 on presentation --replete with IV mag   # Syncope and collapse --likely due to electrolytes abnormalities. --MRI brain neg acute finding -Needs therapy evaluations   # AKI --Cr 1.67 on presentation.   Baseline ~0.8. --AKI resolved the next day after IVF. Plan: No further IVF.  Avoid neurotoxins   # Painful rash or denuded skin --painful patches of skin under left buttock and left thigh.  Unclear etiology.  Given reported chronic urine and stool incontinence, could be a cause for skin breakdown. -Presentation inconsistent with shingles Plan: --wound care consult   Diabetes mellitus type 2 --A1c 6.2.  No need for BG checks.  Hypertension: Blood pressure low normal on presentation.  BP meds not started on admission. Plan: --resume losartan  B12 deficiency: --resume home B12 supplement after discharge.   Peripheral arterial disease: Stable patient being followed by vascular specialist. --resume ASA and plavix   Tobacco abuse: Nicotine patch.     Glaucoma --cont home eye drops   DVT prophylaxis: SQ Lovenox Code Status: Full Family Communication: Attempted to call brother Roger [facility] [phone] on 10/5.  Busy signal Disposition Plan: Status is: Inpatient  Remains inpatient appropriate because:Inpatient level of care appropriate due to severity of illness  Dispo: The patient is from: Home              Anticipated d/c is to: Home              Patient currently is not medically stable to d/c.   Difficult to place patient No       Level of care: Progressive Cardiac  Consultants:  None   Procedures:  None   Antimicrobials: None   Subjective: Seen and examined.  Patient was upset about aggressive fluid intake.  States she presented to the  hospital for fall.  I explained to her at length the reasons for continued hospitalization and the aggressive IV fluid resuscitation.  Objective: Vitals:   02/27/21 0726 02/27/21 1210 02/27/21 1437 02/27/21 1527  BP: (!) 146/82 (!) 216/86 (!) 199/98 (!) 147/119  Pulse: 69 79 70 88  Resp: 18 18    Temp: 98.4 F (36.9 C) 97.6 F (36.4 C)  98.3 F (36.8 C)  TempSrc:      SpO2: 90% (!) 86%    Weight:      Height:         Intake/Output Summary (Last 24 hours) at 02/27/2021 1540 Last data filed at 02/27/2021 1300 Gross per 24 hour  Intake 340 ml  Output 500 ml  Net -160 ml   Filed Weights   02/27/21 0202  Weight: 60.3 kg    Examination:  General exam: Appears calm and comfortable  Respiratory system: Clear to auscultation. Respiratory effort normal. Cardiovascular system: S1 & S2 heard, RRR. No JVD, murmurs, rubs, gallops or clicks. No pedal edema. Gastrointestinal system: Abdomen is nondistended, soft and nontender. No organomegaly or masses felt. Normal bowel sounds heard. Central nervous system: Alert and oriented. No focal neurological deficits. Extremities: Symmetric 5 x 5 power. Skin: Skin breakdown left buttock left thigh Psychiatry: Judgement and insight appear normal. Mood & affect appropriate.     Data Reviewed: I have personally reviewed following labs and imaging studies  CBC: Recent Labs  Lab 02/25/21 2238 02/26/21 0517 02/27/21 0252  WBC 11.0* 8.2 8.5  NEUTROABS 9.5*  --   --   HGB 10.6* 10.2* 11.3*  HCT 31.1* 29.7* 33.5*  MCV 83.2 81.6 85.5  PLT 246 230 276   Basic Metabolic Panel: Recent Labs  Lab 02/25/21 2238 02/26/21 0015 02/26/21 0517 02/26/21 1918 02/27/21 0252  NA 134*  --  139 138 141  K <2.0*  --  <2.0* 2.5* 2.8*  CL 86*  --  95* 95* 95*  CO2 35*  --  35* 33* 34*  GLUCOSE 173*  --  165* 197* 155*  BUN 30*  --  24* 18 16  CREATININE 1.67*  --  1.16* 0.87 0.83  CALCIUM 7.8*  --  7.5* 7.7* 8.0*  MG 1.5*  --   --  2.4 2.2  PHOS  --  3.3  --   --  2.1*   GFR: Estimated Creatinine Clearance: 50.6 mL/min (by C-G formula based on SCr of 0.83 mg/dL). Liver Function Tests: Recent Labs  Lab 02/25/21 2238 02/26/21 0517  AST 35 31  ALT 18 17  ALKPHOS 59 51  BILITOT 1.1 1.1  PROT 6.3* 5.3*  ALBUMIN 3.1* 2.6*   No results for input(s): LIPASE, AMYLASE in the last 168 hours. No results for input(s): AMMONIA in the last 168 hours. Coagulation  Profile: Recent Labs  Lab 02/25/21 2238 02/26/21 0517  INR 1.0 1.0   Cardiac Enzymes: No results for input(s): CKTOTAL, CKMB, CKMBINDEX, TROPONINI in the last 168 hours. BNP (last 3 results) No results for input(s): PROBNP in the last 8760 hours. HbA1C: No results for input(s): HGBA1C in the last 72 hours. CBG: Recent Labs  Lab 02/27/21 1504  GLUCAP 207*   Lipid Profile: No results for input(s): CHOL, HDL, LDLCALC, TRIG, CHOLHDL, LDLDIRECT in the last 72 hours. Thyroid Function Tests: Recent Labs    02/26/21 0027  TSH 7.867*  FREET4 1.30*   Anemia Panel: No results for input(s): VITAMINB12, FOLATE, FERRITIN, TIBC, IRON, RETICCTPCT in the last 72  hours. Sepsis Labs: Recent Labs  Lab 02/25/21 2238  PROCALCITON 1.16    Recent Results (from the past 240 hour(s))  Resp Panel by RT-PCR (Flu A&B, Covid) Nasopharyngeal Swab     Status: None   Collection Time: 02/26/21 12:08 AM   Specimen: Nasopharyngeal Swab; Nasopharyngeal(NP) swabs in vial transport medium  Result Value Ref Range Status   SARS Coronavirus 2 by RT PCR NEGATIVE NEGATIVE Final    Comment: (NOTE) SARS-CoV-2 target nucleic acids are NOT DETECTED.  The SARS-CoV-2 RNA is generally detectable in upper respiratory specimens during the acute phase of infection. The lowest concentration of SARS-CoV-2 viral copies this assay can detect is 138 copies/mL. A negative result does not preclude SARS-Cov-2 infection and should not be used as the sole basis for treatment or other patient management decisions. A negative result may occur with  improper specimen collection/handling, submission of specimen other than nasopharyngeal swab, presence of viral mutation(s) within the areas targeted by this assay, and inadequate number of viral copies(<138 copies/mL). A negative result must be combined with clinical observations, patient history, and epidemiological information. The expected result is Negative.  Fact Sheet for  Patients:  BloggerCourse.com  Fact Sheet for Healthcare Providers:  SeriousBroker.it  This test is no t yet approved or cleared by the [place] FDA and  has been authorized for detection and/or diagnosis of SARS-CoV-2 by FDA under an Emergency Use Authorization (EUA). This EUA will remain  in effect (meaning this test can be used) for the duration of the COVID-19 declaration under Section 564(b)(1) of the Act, 21 U.S.C.section 360bbb-3(b)(1), unless the authorization is terminated  or revoked sooner.       Influenza A by PCR NEGATIVE NEGATIVE Final   Influenza B by PCR NEGATIVE NEGATIVE Final    Comment: (NOTE) The Xpert Xpress SARS-CoV-2/FLU/RSV plus assay is intended as an aid in the diagnosis of influenza from Nasopharyngeal swab specimens and should not be used as a sole basis for treatment. Nasal washings and aspirates are unacceptable for Xpert Xpress SARS-CoV-2/FLU/RSV testing.  Fact Sheet for Patients: BloggerCourse.com  Fact Sheet for Healthcare Providers: SeriousBroker.it  This test is not yet approved or cleared by the [place] FDA and has been authorized for detection and/or diagnosis of SARS-CoV-2 by FDA under an Emergency Use Authorization (EUA). This EUA will remain in effect (meaning this test can be used) for the duration of the COVID-19 declaration under Section 564(b)(1) of the Act, 21 U.S.C. section 360bbb-3(b)(1), unless the authorization is terminated or revoked.  Performed at [facility], [address] [place], [place] [address]   CULTURE, BLOOD (ROUTINE X 2) w Reflex to ID Panel     Status: None (Preliminary result)   Collection Time: 02/26/21 12:08 AM   Specimen: BLOOD  Result Value Ref Range Status   Specimen Description BLOOD RIGHT ASSIST CONTROL  Final   Special Requests   Final    BOTTLES DRAWN AEROBIC AND ANAEROBIC Blood  Culture adequate volume   Culture   Final    NO GROWTH 1 DAY Performed at [facility], [address] [place], [place] [address]    Report Status PENDING  Incomplete  CULTURE, BLOOD (ROUTINE X 2) w Reflex to ID Panel     Status: None (Preliminary result)   Collection Time: 02/26/21 12:27 AM   Specimen: BLOOD  Result Value Ref Range Status   Specimen Description BLOOD LEFT ASSIST CONTROL  Final   Special Requests   Final  BOTTLES DRAWN AEROBIC AND ANAEROBIC Blood Culture results may not be optimal due to an inadequate volume of blood received in culture bottles   Culture   Final    NO GROWTH 1 DAY Performed at [facility], [address] [place], [place] [address]    Report Status PENDING  Incomplete         Radiology Studies: DG Chest 2 View  Result Date: 02/25/2021 CLINICAL DATA:  Fall EXAM: CHEST - 2 VIEW COMPARISON:  09/07/2009 FINDINGS: Mild cardiomegaly. Aortic atherosclerosis. Focal area of relatively increased density at the left lung base on frontal view is favored to be secondary to the overlapping anterior left sixth and posterior left ninth ribs. No focal airspace consolidation, pleural effusion, or pneumothorax. No acute bony findings. IMPRESSION: No active cardiopulmonary disease. Electronically Signed   By: Duanne Guess D.O.   On: 02/25/2021 21:42   CT HEAD WO CONTRAST ( )  Result Date: 02/25/2021 CLINICAL DATA:  Head trauma, minor (Age >= 65y) EXAM: CT HEAD WITHOUT CONTRAST TECHNIQUE: Contiguous axial images were obtained from the base of the skull through the vertex without intravenous contrast. COMPARISON:  12/04/2006 FINDINGS: Brain: No evidence of acute infarction, hemorrhage, hydrocephalus, extra-axial collection or mass lesion/mass effect. Unchanged mineralization noted within the basal ganglia bilaterally. Scattered low-density changes within the periventricular and subcortical white matter compatible with chronic microvascular  ischemic change. Mild diffuse cerebral volume loss. Vascular: Atherosclerotic calcifications involving the large vessels of the skull base. No unexpected hyperdense vessel. Skull: Normal. Negative for fracture or focal lesion. Sinuses/Orbits: Visualized paranasal sinuses and mastoid air cells are clear. Right globe prosthesis. Other: Negative for scalp hematoma. IMPRESSION: 1. No acute intracranial findings. 2. Mild chronic microvascular ischemic change and cerebral volume loss. Electronically Signed   By: Duanne Guess D.O.   On: 02/25/2021 21:54   CT Cervical Spine Wo Contrast  Result Date: 02/25/2021 CLINICAL DATA:  Neck trauma (Age >= 65y) EXAM: CT CERVICAL SPINE WITHOUT CONTRAST TECHNIQUE: Multidetector CT imaging of the cervical spine was performed without intravenous contrast. Multiplanar CT image reconstructions were also generated. COMPARISON:  None. FINDINGS: Alignment: Facet joints are aligned without dislocation or traumatic listhesis. Dens and lateral masses are aligned. Straightening of the cervical lordosis with trace retrolisthesis at C5-6. Skull base and vertebrae: No acute fracture. No primary bone lesion or focal pathologic process. Soft tissues and spinal canal: No prevertebral fluid or swelling. No visible canal hematoma. Disc levels: Degenerative disc disease of C5-6. Facet arthropathy is most notable on the right at C7-T1. Upper chest: Included lung apices are clear. Other: Atherosclerotic calcifications of the visualized aortic arch and branch vessels as well as the bilateral carotid arteries. IMPRESSION: 1. No acute fracture or traumatic listhesis of the cervical spine. 2. Degenerative disc disease of C5-6. Aortic Atherosclerosis (ICD10-I70.0). Electronically Signed   By: Duanne Guess D.O.   On: 02/25/2021 21:57   MR BRAIN WO CONTRAST  Result Date: 02/26/2021 CLINICAL DATA:  75 year old female status post syncope and fall. Left leg weakness. Altered mental status. EXAM: MRI  HEAD WITHOUT CONTRAST TECHNIQUE: Multiplanar, multiecho pulse sequences of the brain and surrounding structures were obtained without intravenous contrast. COMPARISON:  Head CT 02/25/2021. FINDINGS: Brain: No restricted diffusion to suggest acute infarction. No midline shift, mass effect, evidence of mass lesion, ventriculomegaly, extra-axial collection or acute intracranial hemorrhage. Cervicomedullary junction and pituitary are within normal limits. Chronic microhemorrhage in the left parietal lobe with mild DWI susceptibility (series 8, image 18). No cortical encephalomalacia identified.  No other chronic cerebral blood products identified. Minimal to mild for age nonspecific mostly periventricular cerebral white matter T2 and FLAIR hyperintensity. Patchy mild to moderate T2 heterogeneity in the pons. Deep gray matter nuclei and cerebellum appear normal for age. Vascular: Major intracranial vascular flow voids are preserved. Tortuous cavernous ICAs. Skull and upper cervical spine: Negative aside from degenerative ligamentous hypertrophy about the odontoid. Normal visible bone marrow signal. Sinuses/Orbits: Postoperative changes to the right globe. Otherwise negative orbits. Paranasal sinuses and mastoids are stable and well aerated. Other: Grossly normal visible internal auditory structures. Negative visible scalp and face. IMPRESSION: 1. No acute intracranial abnormality. 2. Mild to moderate for age nonspecific signal changes in the brain, including a solitary chronic microhemorrhage in the left parietal lobe. Favor chronic small vessel disease. Electronically Signed   By: Odessa Fleming M.D.   On: 02/26/2021 06:58   US Carotid Bilateral  Result Date: 02/26/2021 CLINICAL DATA:  Syncope and collapse. History of hypertension, hyperlipidemia, diabetes and smoking. EXAM: BILATERAL CAROTID DUPLEX ULTRASOUND TECHNIQUE: Wallace Cullens scale imaging, color Doppler and duplex ultrasound were performed of bilateral carotid and  vertebral arteries in the neck. COMPARISON:  None. FINDINGS: Criteria: Quantification of carotid stenosis is based on velocity parameters that correlate the residual internal carotid diameter with NASCET-based stenosis levels, using the diameter of the distal internal carotid lumen as the denominator for stenosis measurement. The following velocity measurements were obtained: RIGHT ICA: 91/10 cm/sec CCA: 79/7 cm/sec SYSTOLIC ICA/CCA RATIO:  1.2 ECA: 222 cm/sec LEFT ICA: 124/13 cm/sec CCA: 66/6 cm/sec SYSTOLIC ICA/CCA RATIO:  1.9 ECA: 208 cm/sec RIGHT CAROTID ARTERY: Scattered eccentric echogenic plaque throughout the right common carotid artery (images 8, 11 and 12). There is a large amount of eccentric echogenic plaque within the right carotid bulb (image 15 and 17), extending to involve the origin and proximal aspects of the right internal carotid artery (image 27), morphologically resulting in 50% luminal narrowing though not resulting in elevated peak systolic velocities within the interrogated course the right internal carotid artery. RIGHT VERTEBRAL ARTERY:  Antegrade Flow LEFT CAROTID ARTERY: There is a large amount of eccentric echogenic plaque scattered throughout the left common carotid artery (images 38, 39, 42). There is a large amount of eccentric echogenic plaque within left carotid bulb (image 51 and 53), extending to involve the origin and proximal aspects of the left internal carotid artery (image 62), morphologically resulting in 50% luminal narrowing and borderline elevated peak systolic velocity within mid aspect the left internal carotid artery. Greatest acquired peak systolic velocity within mid left ICA measures 124 centimeters/second (image 67). LEFT VERTEBRAL ARTERY:  Antegrade flow IMPRESSION: 1. Large amount of left-sided atherosclerotic plaque morphologically results in 50% luminal narrowing and borderline elevated peak systolic velocities within the left internal carotid artery. Further  evaluation with CTA could be performed as clinically indicated. 2. Large amount of right-sided atherosclerotic plaque morphologically results in at least 50% luminal narrowing though does not definitely resulting in elevated peak systolic velocities within the interrogated course of the right internal carotid artery. Further evaluation with CTA could be performed as indicated. 3. Antegrade flow demonstrated within the bilateral vertebral arteries. Electronically Signed   By: Simonne Come M.D.   On: 02/26/2021 11:38   DG Chest Port 1 View  Result Date: 02/26/2021 CLINICAL DATA:  Shortness of breath. EXAM: PORTABLE CHEST 1 VIEW COMPARISON:  February 25, 2021 FINDINGS: Mild, diffusely increased interstitial lung markings are seen. This is mildly increased in severity when compared to the  prior study. Very mild areas of atelectasis are noted within the bilateral lung bases. There is no evidence of a pleural effusion or pneumothorax. Mild to moderate severity cardiac silhouette enlargement is seen. There is marked severity calcification of the thoracic aorta. Degenerative changes are noted throughout the thoracic spine. IMPRESSION: 1. Mild diffuse interstitial lung disease with a mild amount of superimposed interstitial edema. Electronically Signed   By: Aram Candela M.D.   On: 02/26/2021 21:11   ECHOCARDIOGRAM COMPLETE  Result Date: 02/26/2021    ECHOCARDIOGRAM REPORT   Patient Name:   ADRIELLE POLAKOWSKI Date of Exam: 02/26/2021 Medical Rec #:  [id number]      Height:       64.0 in Accession #:    [id number]     Weight:       137.0 lb Date of Birth:  08/10/1945      BSA:          1.666 m Patient Age:    75 years       BP:           156/85 mmHg Patient Gender: F              HR:           66 bpm. Exam Location:  [facility] Procedure: 2D Echo, Cardiac Doppler and Color Doppler Indications:     Syncope R55  History:         Patient has no prior history of Echocardiogram examinations.                  Risk Factors:Diabetes  and Hypertension. DVT.  Sonographer:     Cristela Blue Referring Phys:  [id number] EKTA V PATEL Diagnosing Phys: Sena Slate IMPRESSIONS  1. Left ventricular ejection fraction, by estimation, is 60 to 65%. The left ventricle has normal function. The left ventricle has no regional wall motion abnormalities. There is mild left ventricular hypertrophy. Left ventricular diastolic parameters are consistent with Grade II diastolic dysfunction (pseudonormalization).  2. Right ventricular systolic function was not well visualized. The right ventricular size is not well visualized.  3. Left atrial size was moderately dilated.  4. Right atrial size was mildly dilated.  5. Trivial effusion versus pericardial fat pad.  6. The mitral valve is degenerative. No evidence of mitral valve regurgitation. No evidence of mitral stenosis. Moderate mitral annular calcification.  7. The aortic valve is tricuspid. Aortic valve regurgitation is not visualized. Mild aortic valve sclerosis is present, with no evidence of aortic valve stenosis. FINDINGS  Left Ventricle: Left ventricular ejection fraction, by estimation, is 60 to 65%. The left ventricle has normal function. The left ventricle has no regional wall motion abnormalities. The left ventricular internal cavity size was normal in size. There is  mild left ventricular hypertrophy. Left ventricular diastolic parameters are consistent with Grade II diastolic dysfunction (pseudonormalization). Right Ventricle: The right ventricular size is not well visualized. Right vetricular wall thickness was not well visualized. Right ventricular systolic function was not well visualized. Left Atrium: Left atrial size was moderately dilated. Right Atrium: Right atrial size was mildly dilated. Pericardium: Trivial effusion versus pericardial fat pad. Mitral Valve: The mitral valve is degenerative in appearance. Moderate mitral annular calcification. No evidence of mitral valve regurgitation. No evidence of  mitral valve stenosis. MV peak gradient, 6.2 mmHg. The mean mitral valve gradient is 3.0 mmHg. Tricuspid Valve: The tricuspid valve is normal in structure. Tricuspid valve regurgitation is trivial. Aortic Valve: The aortic valve is  tricuspid. Aortic valve regurgitation is not visualized. Mild aortic valve sclerosis is present, with no evidence of aortic valve stenosis. Aortic valve mean gradient measures 3.5 mmHg. Aortic valve peak gradient measures 5.7 mmHg. Aortic valve area, by VTI measures 4.83 cm. Pulmonic Valve: The pulmonic valve was grossly normal. Pulmonic valve regurgitation is not visualized. No evidence of pulmonic stenosis. Aorta: The aortic root is normal in size and structure. Venous: The inferior vena cava was not well visualized. IAS/Shunts: No atrial level shunt detected by color flow Doppler.  LEFT VENTRICLE PLAX 2D LVIDd:         5.10 cm  Diastology LVIDs:         2.60 cm  LV e' medial:    3.15 cm/s LV PW:         1.40 cm  LV E/e' medial:  37.1 LV IVS:        1.20 cm  LV e' lateral:   4.46 cm/s LVOT diam:     2.00 cm  LV E/e' lateral: 26.2 LV SV:         117 LV SV Index:   70 LVOT Area:     3.14 cm  RIGHT VENTRICLE RV S prime:     11.70 cm/s LEFT ATRIUM             Index       RIGHT ATRIUM           Index LA diam:        4.50 cm 2.70 cm/m  RA Area:     19.40 cm LA Vol (A2C):   93.9 ml 56.37 ml/m RA Volume:   54.60 ml  32.78 ml/m LA Vol (A4C):   64.6 ml 38.78 ml/m LA Biplane Vol: 78.1 ml 46.88 ml/m  AORTIC VALVE                    PULMONIC VALVE AV Area (Vmax):    4.36 cm     PV Vmax:        0.85 m/s AV Area (Vmean):   3.96 cm     PV Peak grad:   2.9 mmHg AV Area (VTI):     4.83 cm     RVOT Peak grad: 4 mmHg AV Vmax:           119.00 cm/s AV Vmean:          85.600 cm/s AV VTI:            0.242 m AV Peak Grad:      5.7 mmHg AV Mean Grad:      3.5 mmHg LVOT Vmax:         165.00 cm/s LVOT Vmean:        108.000 cm/s LVOT VTI:          0.373 m LVOT/AV VTI ratio: 1.54  AORTA Ao Root diam:  2.70 cm MITRAL VALVE                TRICUSPID VALVE MV Area (PHT): 2.87 cm     TR Peak grad:   9.4 mmHg MV Area VTI:   3.17 cm     TR Vmax:        153.00 cm/s MV Peak grad:  6.2 mmHg MV Mean grad:  3.0 mmHg     SHUNTS MV Vmax:       1.25 m/s     Systemic VTI:  0.37 m MV Vmean:      72.2 cm/s  Systemic Diam: 2.00 cm MV Decel Time: 264 msec MV E velocity: 117.00 cm/s MV A velocity: 113.00 cm/s MV E/A ratio:  1.04 Sena Slate Electronically signed by Sena Slate Signature Date/Time: 02/26/2021/5:11:58 PM    Final         Scheduled Meds:  amLODipine  5 mg Oral Daily   aspirin EC  81 mg Oral Daily   atropine  1 drop Right Eye TID   brimonidine  1 drop Both Eyes BID   [START ON 02/28/2021] citalopram  20 mg Oral Daily   clopidogrel  75 mg Oral Daily   dorzolamide-timolol  1 drop Both Eyes BID   enoxaparin (LOVENOX) injection  40 mg Subcutaneous Q24H   gabapentin  300 mg Oral BID   liver oil-zinc oxide   Topical BID   losartan  100 mg Oral Daily   nicotine  21 mg Transdermal Daily   potassium chloride  40 mEq Oral Q4H   rosuvastatin  10 mg Oral Daily   Continuous Infusions:   LOS: 1 day    Time spent: 25 minutes    Tresa Moore, MD [facility]   If 7PM-7AM, please contact night-coverage  02/27/2021, 3:40 PM

## 2021-02-27 NOTE — Consult Note (Signed)
WOC Nurse Consult Note: Reason for Consult:Nonintact skin to left buttock and thigh. Consistent with moisture associated skin damage (MASD)  HAd shingles some time back and this rash is not from that, she states  She admits to incontinence at times and delayed incontinence care. Duration of this rash 1 week.  Wound type:MASD Pressure Injury POA: NA Measurement: left posterior thigh and left gluteal, in distal fold   22 cm x 12 cm darkened raised skin with sloughing epithelium in center.   Wound YOM:AYOK and moist Drainage (amount, consistency, odor) scant weeping Periwound: darkened raised epithelium consistent with incontinence associated dermatitis Patient has appointment with gastroenterologist on 10/20 regarding fecal incontinence. She states she worries about her ability to care for herself much longer at home.  She spoke with social services and a realtor regarding selling her home and seeking assistance and was told she did not qualify.  She states she has always kept a spotless home and now she is unable to do so.   Dressing procedure/placement/frequency: Boudreaux butt paste to left gluteal/thigh rash. NO disposable briefs or underpads while in bed. Apply twice daily and PRN soilage.  Will not follow at this time.  Please re-consult if needed.  [place]le [place] MSN, RN, FNP-BC CWON Wound, Ostomy, Continence Nurse Pager [phone]

## 2021-02-27 NOTE — Progress Notes (Signed)
PT Cancellation Note  Patient Details Name: Lisa [name] MRN: [id number] DOB: November 05, 1945   Cancelled Treatment:    Reason Eval/Treat Not Completed: Medical issues which prohibited therapy (Consult received and chart reviewed. Upon arrival to room, patient sleeping soundly, minimally interactive with therapist (except to verbalize "uh uh" when asked to initiate therapy services).  Supine BP 199/98; HR 70.  Requested FSBS per CNA.  Charge RN informed/aware of patient presentation, vitals. Will hold PT eval at this time and re-attempt next date as medically appropriate and available.)  Kirin Pastorino H. Manson Passey, PT, DPT, NCS 02/27/21, 3:00 PM [phone]

## 2021-02-28 ENCOUNTER — Inpatient Hospital Stay: Payer: Medicare Other

## 2021-02-28 LAB — BASIC METABOLIC PANEL
Anion gap: 8 (ref 5–15)
BUN: 15 mg/dL (ref 8–23)
CO2: 29 mmol/L (ref 22–32)
Calcium: 8 mg/dL — ABNORMAL LOW (ref 8.9–10.3)
Chloride: 101 mmol/L (ref 98–111)
Creatinine, Ser: 0.61 mg/dL (ref 0.44–1.00)
GFR, Estimated: >60 mL/min (ref >60)
Glucose, Bld: 159 mg/dL — ABNORMAL HIGH (ref 70–99)
Potassium: 3.4 mmol/L — ABNORMAL LOW (ref 3.5–5.1)
Sodium: 138 mmol/L (ref 135–145)

## 2021-02-28 LAB — CBC
HCT: 30.1 % — ABNORMAL LOW (ref 36.0–46.0)
Hemoglobin: 10 g/dL — ABNORMAL LOW (ref 12.0–15.0)
MCH: 27.6 pg (ref 26.0–34.0)
MCHC: 33.2 g/dL (ref 30.0–36.0)
MCV: 83.1 fL (ref 80.0–100.0)
Platelets: 243 10*3/uL (ref 150–400)
RBC: 3.62 MIL/uL — ABNORMAL LOW (ref 3.87–5.11)
RDW: 17.4 % — ABNORMAL HIGH (ref 11.5–15.5)
WBC: 6.8 10*3/uL (ref 4.0–10.5)
nRBC: 0 % (ref 0.0–0.2)

## 2021-02-28 LAB — MAGNESIUM: Magnesium: 1.8 mg/dL (ref 1.7–2.4)

## 2021-02-28 LAB — PHOSPHORUS: Phosphorus: 2.1 mg/dL — ABNORMAL LOW (ref 2.5–4.6)

## 2021-02-28 MED ORDER — AMLODIPINE BESYLATE 10 MG PO TABS
10.0000 mg | ORAL_TABLET | Freq: Every day | ORAL | Status: DC
  Administered 2021-02-28 – 2021-03-03 (×4): 10 mg via ORAL
  Filled 2021-02-28 (×4): qty 1

## 2021-02-28 MED ORDER — POTASSIUM CHLORIDE CRYS ER 20 MEQ PO TBCR
40.0000 meq | EXTENDED_RELEASE_TABLET | Freq: Once | ORAL | Status: AC
  Administered 2021-02-28: 40 meq via ORAL
  Filled 2021-02-28: qty 2

## 2021-02-28 NOTE — Progress Notes (Signed)
PT Cancellation Note  Patient Details Name: Lisa [name] MRN: [id number] DOB: [birth year]   Cancelled Treatment:    Reason Eval/Treat Not Completed: Medical issues which prohibited therapy (Evaluation re-attempted.  Noted orders for doppler to R UE to rule out DVT.  Will hold activity until test complete, results received and patient cleared for activity.)  Marquise Lambson H. Manson Passey, PT, DPT, NCS 02/28/21, 10:15 AM [phone]

## 2021-02-28 NOTE — Evaluation (Signed)
Physical Therapy Evaluation Patient Details Name: Lisa [name] MRN: [id number] DOB: [birth year] Today's Date: 02/28/2021  History of Present Illness  Lisa [name] is a 75 y.o. female wiith hx of blindness in the right eye, diabetes mellitus type 2, DVT, glaucoma, hypertension, depression, LLE transmetatarsal amputation, and tobacco abuse who presented with complaints of a fall.  Clinical Impression   Patient seated in recliner upon arrival to session; recently completed OT session.  Alert and oriented to basic information, but generally tangential in speech at times and seemingly distractible by/hypersensitive to internal stimuli.  Patient with generalized weakness throughout all extremities, but no focal weakness appreciated.  Do note scaling/flaking of skin to L buttocks/posterior thigh; generally painful and uncomfortable to patient (MD aware).  Able to complete sit/stand, basic transfers and gait (230') with RW, cga/close sup.  Demonstrates reciprocal stepping with fair step height/length; mild sway with head turns and dynamic gait components, self-corrects, but does require RW for optimal safety.  Does endorse feeling "spacey" intermittently during gait trial, BP 167/106 (MAP 121).  MD informed/aware. Would benefit from skilled PT to address above deficits and promote optimal return to PLOF.; Recommend transition to HHPT upon discharge from acute hospitalization.    Recommendations for follow up therapy are one component of a multi-disciplinary discharge planning process, led by the attending physician.  Recommendations may be updated based on patient status, additional functional criteria and insurance authorization.  Follow Up Recommendations Home health PT    Equipment Recommendations  Rolling walker with 5" wheels    Recommendations for Other Services       Precautions / Restrictions Precautions Precautions: Fall Restrictions Weight Bearing Restrictions: No      Mobility   Bed Mobility               General bed mobility comments: seated in recliner beginning/end of treatmnet session    Transfers Overall transfer level: Needs assistance Equipment used: Rolling walker (2 wheeled) Transfers: Sit to/from Stand Sit to Stand: Supervision            Ambulation/Gait Ambulation/Gait assistance: Min guard;Supervision Gait Distance (Feet): 230 Feet Assistive device: Rolling walker (2 wheeled)       General Gait Details: reciprocal stepping with fair step height/length; mild sway with head turns and dynamic gait components, self-corrects, but does require RW for optimal safety.  Does endorse feeling "spacey" intermittently during gait trial, BP 167/106 (MAP 121).  Stairs            Wheelchair Mobility    Modified Rankin (Stroke Patients Only)       Balance Overall balance assessment: Needs assistance Sitting-balance support: No upper extremity supported;Feet supported Sitting balance-Leahy Scale: Good     Standing balance support: Bilateral upper extremity supported Standing balance-Leahy Scale: Fair                               Pertinent Vitals/Pain Pain Assessment: Faces Faces Pain Scale: Hurts even more Pain Location: L hip/posterior thigh over area of skin breakdown/deterioration Pain Descriptors / Indicators: Discomfort Pain Intervention(s): Limited activity within patient's tolerance;Monitored during session;Repositioned    Home Living Family/patient expects to be discharged to:: Private residence Living Arrangements: Alone Available Help at Discharge: Family;Available PRN/intermittently                  Prior Function Level of Independence: Independent         Comments: Indep for ADLs, household and community  mobilization without assist device; + driving, but often takes friend/family member with on community trips/longer distances     Hand Dominance        Extremity/Trunk Assessment    Upper Extremity Assessment Upper Extremity Assessment: Generalized weakness    Lower Extremity Assessment Lower Extremity Assessment: Generalized weakness (grossly at least 4-/5 throughout; no focal weakness.  L transmet amputataion noted.  Significant peeling/flaking of skin to L buttocks/posterior thigh-very sensitive/painful to patient)       Communication   Communication: No difficulties  Cognition Arousal/Alertness: Awake/alert Behavior During Therapy: Flat affect Overall Cognitive Status: Within Functional Limits for tasks assessed                                 General Comments: Pt is slow to respond to questions and does not answer all questions asked. Difficulty maintaining attention to single topic; generally hypersensitive/distractible by internal stimuli at times      General Comments      Exercises Other Exercises Other Exercises: Reviewed role of PT and progressive mobility; reviewed safety with transfers and gait, use of RW for safety.  PAtient voiced understanding; will reinforce as needed.   Assessment/Plan    PT Assessment Patient needs continued PT services  PT Problem List Decreased activity tolerance;Decreased balance;Decreased mobility;Decreased safety awareness       PT Treatment Interventions DME instruction;Gait training;Functional mobility training;Therapeutic activities;Patient/family education;Balance training;Therapeutic exercise    PT Goals (Current goals can be found in the Care Plan section)  Acute Rehab PT Goals Patient Stated Goal: to go home PT Goal Formulation: With patient Time For Goal Achievement: 03/14/21 Potential to Achieve Goals: Good    Frequency Min 2X/week   Barriers to discharge        Co-evaluation               AM-PAC PT "6 Clicks" Mobility  Outcome Measure Help needed turning from your back to your side while in a flat bed without using bedrails?: None Help needed moving from lying on your back  to sitting on the side of a flat bed without using bedrails?: None Help needed moving to and from a bed to a chair (including a wheelchair)?: None Help needed standing up from a chair using your arms (e.g., wheelchair or bedside chair)?: A Little Help needed to walk in hospital room?: None Help needed climbing 3-5 steps with a railing? : A Little 6 Click Score: 22    End of Session Equipment Utilized During Treatment: Gait belt Activity Tolerance: Patient tolerated treatment well Patient left: in chair;with call bell/phone within reach;with chair alarm set Nurse Communication: Mobility status PT Visit Diagnosis: Difficulty in walking, not elsewhere classified (R26.2);Pain Pain - Right/Left: Left Pain - part of body: Leg    Time: 7408-1448 PT Time Calculation (min) (ACUTE ONLY): 24 min   Charges:   PT Evaluation $PT Eval Moderate Complexity: 1 Mod PT Treatments $Therapeutic Activity: 8-22 mins        Temia Debroux H. Manson Passey, PT, DPT, NCS 02/28/21, 10:31 PM [phone]

## 2021-02-28 NOTE — Progress Notes (Signed)
Occupational Therapy Treatment Patient Details Name: Lisa [name] MRN: [id number] DOB: [birth year] Today's Date: 02/28/2021   History of present illness Lisa [name] is a 75 y.o. female wiith hx of blindness in the right eye, diabetes mellitus type 2, DVT, glaucoma, hypertension, depression, LLE transmetatarsal amputation, and tobacco abuse who presented with complaints of a fall.   OT comments  Lisa [name] was seen for OT treatment on this date. Upon arrival to room pt reclined in bed, [place] doffed, family at bedside. Pt agreeable to session with encouragement. RN notified of BP elevated and RN cleared pt for participation in mobility. Pt requires SETUP + SUPERVISION seated grooming/ self-drinking tasks. CGA + RW for ADL t/f. Single UE support for dynamic standing tasks - 1 moderate lateral LOB noted in standing w/o UE support requiring MIN assist to correct.    Pt completed ~200 ft functional mobility, desat mid 80s on RA, resolved to 95% on 1L [place]. Pt making progress toward goals. Pt continues to benefit from skilled OT services to maximize return to PLOF and minimize risk of future falls, injury, caregiver burden, and readmission. Will continue to follow POC. Discharge recommendation HHOT with intermittent supervision.   SEATED: BP 178/84, MAP 111, HR 70 STANDING: BP 155/128, MAP 138, HR 69   Recommendations for follow up therapy are one component of a multi-disciplinary discharge planning process, led by the attending physician.  Recommendations may be updated based on patient status, additional functional criteria and insurance authorization.    Follow Up Recommendations  Home health OT;Supervision - Intermittent    Equipment Recommendations  3 in 1 bedside commode    Recommendations for Other Services      Precautions / Restrictions Precautions Precautions: Fall Restrictions Weight Bearing Restrictions: No       Mobility Bed Mobility Overal bed mobility: Needs Assistance Bed  Mobility: Supine to Sit     Supine to sit: Supervision     General bed mobility comments: increased time to complete    Transfers Overall transfer level: Needs assistance Equipment used: Rolling walker (2 wheeled) Transfers: Sit to/from Stand Sit to Stand: Supervision         General transfer comment: close SBA    Balance Overall balance assessment: Needs assistance Sitting-balance support: No upper extremity supported;Feet supported Sitting balance-Leahy Scale: Good     Standing balance support: No upper extremity supported;During functional activity Standing balance-Leahy Scale: Fair Standing balance comment: 1 lateral LOB, assist to correct                           ADL either performed or assessed with clinical judgement   ADL Overall ADL's : Needs assistance/impaired                                       General ADL Comments: SETUP + SUPERVISION seated grooming/self-drinking tasks. CGA + RW for ADL t/f. Single UE support for dynamic standing tasks - 1 moderate lateral LOB noted in standing w/o UE support      Cognition Arousal/Alertness: Awake/alert Behavior During Therapy: Flat affect Overall Cognitive Status: Within Functional Limits for tasks assessed                                          Exercises Exercises:  Other exercises Other Exercises Other Exercises: Pt educated re:  DME recs, d/c recs, falls prevention, ECS Other Exercises: LBD, self-drinking, sup>sit, sit<>stand, ~200 ft mobility, sitting balance/tolerance      General Comments SpO2 mid 80s on RA following mobility, resolved to 95% on 1L [place]    Pertinent Vitals/ Pain       Pain Assessment: Faces Faces Pain Scale: Hurts little more Pain Location: L hip Pain Descriptors / Indicators: Discomfort;Dull Pain Intervention(s): Limited activity within patient's tolerance;Repositioned   Frequency  Min 2X/week        Progress Toward Goals  OT  Goals(current goals can now be found in the care plan section)  Progress towards OT goals: Progressing toward goals  Acute Rehab OT Goals Patient Stated Goal: to go home OT Goal Formulation: With patient Time For Goal Achievement: 03/13/21 Potential to Achieve Goals: Good ADL Goals Pt Will Perform Grooming: with modified independence;sitting Pt Will Perform Lower Body Dressing: sitting/lateral leans;with set-up;with supervision Pt Will Transfer to Toilet: with min guard assist;stand pivot transfer;bedside commode  Plan Discharge plan remains appropriate;Frequency remains appropriate    Co-evaluation                 AM-PAC OT "6 Clicks" Daily Activity     Outcome Measure   Help from another person eating meals?: None Help from another person taking care of personal grooming?: A Little Help from another person toileting, which includes using toliet, bedpan, or urinal?: A Little Help from another person bathing (including washing, rinsing, drying)?: A Little Help from another person to put on and taking off regular upper body clothing?: None Help from another person to put on and taking off regular lower body clothing?: A Little 6 Click Score: 20    End of Session Equipment Utilized During Treatment: Rolling walker;Gait belt  OT Visit Diagnosis: Other abnormalities of gait and mobility (R26.89);Muscle weakness (generalized) (M62.81)   Activity Tolerance Patient tolerated treatment well   Patient Left in chair;with call bell/phone within reach;with chair alarm set   Nurse Communication  (elevated BP)        Time: 4782-9562 OT Time Calculation (min): 39 min  Charges: OT General Charges $OT Visit: 1 Visit OT Treatments $Self Care/Home Management : 23-37 mins $Therapeutic Activity: 8-22 mins  Kathie Dike, M.S. OTR/L  02/28/21, 4:26 PM  ascom [phone]

## 2021-02-28 NOTE — Progress Notes (Signed)
PROGRESS NOTE    Lisa [name]  TKZ:[id number] DOB: November 19, 1945 DOA: 02/25/2021 PCP: Alba Cory, MD    Brief Narrative:  75 y.o. female wiith hx of blindness in the right eye, diabetes mellitus type 2, DVT, glaucoma, hypertension, depression, tobacco abuse who presented with complaints of a fall.   patient reports that she has difficulty moving her left leg after the fall.  she also reports hitting her head but did not pass out.  Patient started to develop shingles 3 months ago on her right lower back with pain radiating to her left buttock with similar episode few months ago.  She is not bedridden but has chronic urine incontinence and diarrhea.  pt also describes weakness and passing out.  Etiology of hypokalemia is unclear.  Aggressive replacement via IV at first.  However patient developed signs of fluid overload and intravenous fluid resuscitation was discontinued.  Potassium levels have recovered after aggressive oral replacement   Assessment & Plan:   Principal Problem:   Syncope and collapse Active Problems:   Benign essential HTN   Vitamin B12 deficiency   Peripheral artery disease (HCC)   Tobacco use   DM (diabetes mellitus), type 2 with peripheral vascular complications (HCC)   AKI (acute kidney injury) (HCC)   Hypokalemia   Hypomagnesemia   Bruising   Prolonged QT interval  Severe hypokalemia --K+ <2.0 on presentation. --started on IV potassium and NS with Kcl 40 mEq/L @ 250 ml/hr in the ED. Developed clinical signs of fluid overload Potassium improving.  3.4 as of 10/6 Plan: Replace with K-Dur 40 mg  p.o. x1 Recheck potassium in a.m.   # Weakness and fall  --likely 2/2 severe hypokalemia -- Physical therapy evaluation   # Hypomag --mag 1.5 on presentation --replete with IV mag   # Syncope and collapse --likely due to electrolytes abnormalities. --MRI brain neg acute finding -Needs therapy evaluations   # AKI --Cr 1.67 on presentation.  Baseline  ~0.8. --AKI resolved the next day after IVF. Plan: No further IVF.  Avoid neurotoxins   # Painful rash or denuded skin --painful patches of skin under left buttock and left thigh.  Unclear etiology.  Given reported chronic urine and stool incontinence, could be a cause for skin breakdown. -Presentation inconsistent with shingles Plan: --wound care consult   Diabetes mellitus type 2 --A1c 6.2.  No need for BG checks.  Hypertension: Blood pressure low normal on presentation.  BP meds not started on admission. Plan: --resume losartan  B12 deficiency: --resume home B12 supplement after discharge.   Peripheral arterial disease: Stable patient being followed by vascular specialist. --resume ASA and plavix   Tobacco abuse: Nicotine patch.     Glaucoma --cont home eye drops   DVT prophylaxis: SQ Lovenox Code Status: Full Family Communication: Attempted to call brother Roger [facility] [phone] on 10/5.  Busy signal Disposition Plan: Status is: Inpatient  Remains inpatient appropriate because:Inpatient level of care appropriate due to severity of illness  Dispo: The patient is from: Home              Anticipated d/c is to: Home              Patient currently is not medically stable to d/c.   Difficult to place patient No       Level of care: Progressive Cardiac  Consultants:  None   Procedures:  None   Antimicrobials: None   Subjective: Seen and examined.  Endorses fatigue and right arm pain.  Objective:  Vitals:   02/28/21 0014 02/28/21 0436 02/28/21 0813 02/28/21 1248  BP: (!) 147/66 (!) 157/92 (!) 181/81 (!) 166/75  Pulse: 84 74 72 66  Resp: 15 19 14 18   Temp: 98.3 F (36.8 C) 98.9 F (37.2 C) 98.3 F (36.8 C) 97.9 F (36.6 C)  TempSrc:    Oral  SpO2: 94% 100% 99% 98%  Weight:      Height:        Intake/Output Summary (Last 24 hours) at 02/28/2021 1419 Last data filed at 02/28/2021 0626 Gross per 24 hour  Intake 120 ml  Output 800 ml  Net -680  ml   Filed Weights   02/27/21 0202  Weight: 60.3 kg    Examination:  General exam: Appears calm and comfortable  Respiratory system: Clear to auscultation. Respiratory effort normal. Cardiovascular system: S1-S2, RRR, no murmur, no pedal edema Gastrointestinal system: Soft, nontender, nondistended, normal bowel sounds Central nervous system: Alert and oriented. No focal neurological deficits. Extremities: Symmetric 5 x 5 power. Skin: Skin breakdown left buttock left thigh Psychiatry: Judgement and insight appear normal. Mood & affect appropriate.     Data Reviewed: I have personally reviewed following labs and imaging studies  CBC: Recent Labs  Lab 02/25/21 2238 02/26/21 0517 02/27/21 0252 02/28/21 0731  WBC 11.0* 8.2 8.5 6.8  NEUTROABS 9.5*  --   --   --   HGB 10.6* 10.2* 11.3* 10.0*  HCT 31.1* 29.7* 33.5* 30.1*  MCV 83.2 81.6 85.5 83.1  PLT 246 230 276 243   Basic Metabolic Panel: Recent Labs  Lab 02/25/21 2238 02/26/21 0015 02/26/21 0517 02/26/21 1918 02/27/21 0252 02/28/21 0731  NA 134*  --  139 138 141 138  K <2.0*  --  <2.0* 2.5* 2.8* 3.4*  CL 86*  --  95* 95* 95* 101  CO2 35*  --  35* 33* 34* 29  GLUCOSE 173*  --  165* 197* 155* 159*  BUN 30*  --  24* 18 16 15   CREATININE 1.67*  --  1.16* 0.87 0.83 0.61  CALCIUM 7.8*  --  7.5* 7.7* 8.0* 8.0*  MG 1.5*  --   --  2.4 2.2 1.8  PHOS  --  3.3  --   --  2.1* 2.1*   GFR: Estimated Creatinine Clearance: 52.5 mL/min (by C-G formula based on SCr of 0.61 mg/dL). Liver Function Tests: Recent Labs  Lab 02/25/21 2238 02/26/21 0517  AST 35 31  ALT 18 17  ALKPHOS 59 51  BILITOT 1.1 1.1  PROT 6.3* 5.3*  ALBUMIN 3.1* 2.6*   No results for input(s): LIPASE, AMYLASE in the last 168 hours. No results for input(s): AMMONIA in the last 168 hours. Coagulation Profile: Recent Labs  Lab 02/25/21 2238 02/26/21 0517  INR 1.0 1.0   Cardiac Enzymes: No results for input(s): CKTOTAL, CKMB, CKMBINDEX, TROPONINI  in the last 168 hours. BNP (last 3 results) No results for input(s): PROBNP in the last 8760 hours. HbA1C: No results for input(s): HGBA1C in the last 72 hours. CBG: Recent Labs  Lab 02/27/21 1504  GLUCAP 207*   Lipid Profile: No results for input(s): CHOL, HDL, LDLCALC, TRIG, CHOLHDL, LDLDIRECT in the last 72 hours. Thyroid Function Tests: Recent Labs    02/26/21 0027  TSH 7.867*  FREET4 1.30*   Anemia Panel: No results for input(s): VITAMINB12, FOLATE, FERRITIN, TIBC, IRON, RETICCTPCT in the last 72 hours. Sepsis Labs: Recent Labs  Lab 02/25/21 2238  PROCALCITON 1.16    Recent  Results (from the past 240 hour(s))  Resp Panel by RT-PCR (Flu A&B, Covid) Nasopharyngeal Swab     Status: None   Collection Time: 02/26/21 12:08 AM   Specimen: Nasopharyngeal Swab; Nasopharyngeal(NP) swabs in vial transport medium  Result Value Ref Range Status   SARS Coronavirus 2 by RT PCR NEGATIVE NEGATIVE Final    Comment: (NOTE) SARS-CoV-2 target nucleic acids are NOT DETECTED.  The SARS-CoV-2 RNA is generally detectable in upper respiratory specimens during the acute phase of infection. The lowest concentration of SARS-CoV-2 viral copies this assay can detect is 138 copies/mL. A negative result does not preclude SARS-Cov-2 infection and should not be used as the sole basis for treatment or other patient management decisions. A negative result may occur with  improper specimen collection/handling, submission of specimen other than nasopharyngeal swab, presence of viral mutation(s) within the areas targeted by this assay, and inadequate number of viral copies(<138 copies/mL). A negative result must be combined with clinical observations, patient history, and epidemiological information. The expected result is Negative.  Fact Sheet for Patients:  BloggerCourse.com  Fact Sheet for Healthcare Providers:  SeriousBroker.it  This test is  no t yet approved or cleared by the [place] FDA and  has been authorized for detection and/or diagnosis of SARS-CoV-2 by FDA under an Emergency Use Authorization (EUA). This EUA will remain  in effect (meaning this test can be used) for the duration of the COVID-19 declaration under Section 564(b)(1) of the Act, 21 U.S.C.section 360bbb-3(b)(1), unless the authorization is terminated  or revoked sooner.       Influenza A by PCR NEGATIVE NEGATIVE Final   Influenza B by PCR NEGATIVE NEGATIVE Final    Comment: (NOTE) The Xpert Xpress SARS-CoV-2/FLU/RSV plus assay is intended as an aid in the diagnosis of influenza from Nasopharyngeal swab specimens and should not be used as a sole basis for treatment. Nasal washings and aspirates are unacceptable for Xpert Xpress SARS-CoV-2/FLU/RSV testing.  Fact Sheet for Patients: BloggerCourse.com  Fact Sheet for Healthcare Providers: SeriousBroker.it  This test is not yet approved or cleared by the [place] FDA and has been authorized for detection and/or diagnosis of SARS-CoV-2 by FDA under an Emergency Use Authorization (EUA). This EUA will remain in effect (meaning this test can be used) for the duration of the COVID-19 declaration under Section 564(b)(1) of the Act, 21 U.S.C. section 360bbb-3(b)(1), unless the authorization is terminated or revoked.  Performed at [facility], [address] [place], [place] [address]   CULTURE, BLOOD (ROUTINE X 2) w Reflex to ID Panel     Status: None (Preliminary result)   Collection Time: 02/26/21 12:08 AM   Specimen: BLOOD  Result Value Ref Range Status   Specimen Description BLOOD RIGHT ASSIST CONTROL  Final   Special Requests   Final    BOTTLES DRAWN AEROBIC AND ANAEROBIC Blood Culture adequate volume   Culture   Final    NO GROWTH 2 DAYS Performed at [facility], [address] [place], [place] [address]     Report Status PENDING  Incomplete  CULTURE, BLOOD (ROUTINE X 2) w Reflex to ID Panel     Status: None (Preliminary result)   Collection Time: 02/26/21 12:27 AM   Specimen: BLOOD  Result Value Ref Range Status   Specimen Description BLOOD LEFT ASSIST CONTROL  Final   Special Requests   Final    BOTTLES DRAWN AEROBIC AND ANAEROBIC Blood Culture results may not be optimal due to an  inadequate volume of blood received in culture bottles   Culture   Final    NO GROWTH 2 DAYS Performed at [facility], [address] [place], [place] [address]    Report Status PENDING  Incomplete         Radiology Studies: US Venous Img Upper Uni Right(DVT)  Result Date: 02/28/2021 CLINICAL DATA:  Edema near IV site, diabetes, history of tobacco abuse EXAM: RIGHT UPPER EXTREMITY VENOUS DOPPLER ULTRASOUND TECHNIQUE: Gray-scale sonography with graded compression, as well as color Doppler and duplex ultrasound were performed to evaluate the upper extremity deep venous system from the level of the subclavian vein and including the jugular, axillary, basilic, radial, ulnar and upper cephalic vein. Spectral Doppler was utilized to evaluate flow at rest and with distal augmentation maneuvers. COMPARISON:  None. FINDINGS: Contralateral Subclavian Vein: Respiratory phasicity is normal and symmetric with the symptomatic side. No evidence of thrombus. Normal compressibility. Internal Jugular Vein: No evidence of thrombus. Normal compressibility, respiratory phasicity and response to augmentation. Subclavian Vein: No evidence of thrombus. Normal compressibility, respiratory phasicity and response to augmentation. Axillary Vein: No evidence of thrombus. Normal compressibility, respiratory phasicity and response to augmentation. Cephalic Vein: Segmental thrombosis spanning approximately 3 cm in the distal segment, patent more centrally. Basilic Vein: No evidence of thrombus. Normal compressibility, respiratory phasicity  and response to augmentation. Brachial Veins: No evidence of thrombus. Normal compressibility, respiratory phasicity and response to augmentation. Radial Veins: No evidence of thrombus. Normal compressibility, respiratory phasicity and response to augmentation. Ulnar Veins: No evidence of thrombus. Normal compressibility, respiratory phasicity and response to augmentation. Venous Reflux:  None visualized. Other Findings:  None visualized. IMPRESSION: 1. Negative for right upper extremity DVT. 2. Segmental superficial thrombophlebitis involving the distal cephalic vein near the IV site. Electronically Signed   By: Corlis Leak M.D.   On: 02/28/2021 12:51   DG Chest Port 1 View  Result Date: 02/26/2021 CLINICAL DATA:  Shortness of breath. EXAM: PORTABLE CHEST 1 VIEW COMPARISON:  February 25, 2021 FINDINGS: Mild, diffusely increased interstitial lung markings are seen. This is mildly increased in severity when compared to the prior study. Very mild areas of atelectasis are noted within the bilateral lung bases. There is no evidence of a pleural effusion or pneumothorax. Mild to moderate severity cardiac silhouette enlargement is seen. There is marked severity calcification of the thoracic aorta. Degenerative changes are noted throughout the thoracic spine. IMPRESSION: 1. Mild diffuse interstitial lung disease with a mild amount of superimposed interstitial edema. Electronically Signed   By: Aram Candela M.D.   On: 02/26/2021 21:11        Scheduled Meds:  amLODipine  10 mg Oral Daily   aspirin EC  81 mg Oral Daily   atropine  1 drop Right Eye TID   brimonidine  1 drop Both Eyes BID   citalopram  20 mg Oral Daily   clopidogrel  75 mg Oral Daily   dorzolamide-timolol  1 drop Both Eyes BID   enoxaparin (LOVENOX) injection  40 mg Subcutaneous Q24H   gabapentin  300 mg Oral BID   liver oil-zinc oxide   Topical BID   losartan  100 mg Oral Daily   nicotine  21 mg Transdermal Daily   rosuvastatin  10 mg  Oral Daily   Continuous Infusions:   LOS: 2 days    Time spent: 25 minutes    Tresa Moore, MD [facility]   If 7PM-7AM, please contact night-coverage  02/28/2021, 2:19  PM

## 2021-03-01 ENCOUNTER — Other Ambulatory Visit: Payer: Self-pay | Admitting: Family Medicine

## 2021-03-01 DIAGNOSIS — E1151 Type 2 diabetes mellitus with diabetic peripheral angiopathy without gangrene: Secondary | ICD-10-CM

## 2021-03-01 LAB — BASIC METABOLIC PANEL
Anion gap: 8 (ref 5–15)
BUN: 14 mg/dL (ref 8–23)
CO2: 28 mmol/L (ref 22–32)
Calcium: 8.5 mg/dL — ABNORMAL LOW (ref 8.9–10.3)
Chloride: 101 mmol/L (ref 98–111)
Creatinine, Ser: 0.54 mg/dL (ref 0.44–1.00)
GFR, Estimated: >60 mL/min (ref >60)
Glucose, Bld: 144 mg/dL — ABNORMAL HIGH (ref 70–99)
Potassium: 3.8 mmol/L (ref 3.5–5.1)
Sodium: 137 mmol/L (ref 135–145)

## 2021-03-01 LAB — CBC
HCT: 32.4 % — ABNORMAL LOW (ref 36.0–46.0)
Hemoglobin: 10.7 g/dL — ABNORMAL LOW (ref 12.0–15.0)
MCH: 28.1 pg (ref 26.0–34.0)
MCHC: 33 g/dL (ref 30.0–36.0)
MCV: 85 fL (ref 80.0–100.0)
Platelets: 255 10*3/uL (ref 150–400)
RBC: 3.81 MIL/uL — ABNORMAL LOW (ref 3.87–5.11)
RDW: 17.3 % — ABNORMAL HIGH (ref 11.5–15.5)
WBC: 5.9 10*3/uL (ref 4.0–10.5)
nRBC: 0 % (ref 0.0–0.2)

## 2021-03-01 LAB — MAGNESIUM: Magnesium: 1.7 mg/dL (ref 1.7–2.4)

## 2021-03-01 LAB — PHOSPHORUS: Phosphorus: 2.7 mg/dL (ref 2.5–4.6)

## 2021-03-01 NOTE — Plan of Care (Signed)
  Problem: Cardiac: Goal: Will achieve and/or maintain adequate cardiac output Outcome: Progressing   

## 2021-03-01 NOTE — TOC Initial Note (Signed)
Transition of Care [facility]) - Initial/Assessment Note    Patient Details  Name: Lisa [name] MRN: [id number] Date of Birth: [birth year]  [facility] CM/SW Contact:    Alberteen Sam, LCSW Phone Number: 03/01/2021, 11:45 AM  Clinical Narrative:                  CSW met with patient at bedside to discuss [facility] recommendations, agreed to max [facility] services of PT, OT, RN, aide and social worker. Reports has walker at home but does identify need for tub bench.   CSW has informed Suanne Marker with [employer] to deliver tub bench to the room, patient is set up with [facility] for PT, OT, RN, aide and Education officer, museum.   Patient reports at time of discharge she will have someone pick her up from hospital.    Expected Discharge Plan: [place] Barriers to Discharge: Continued Medical Work up   Patient Goals and CMS Choice Patient states their goals for this hospitalization and ongoing recovery are:: to go home CMS Medicare.gov Compare Post Acute Care list provided to:: Patient Choice offered to / list presented to : Patient  Expected Discharge Plan and Services Expected Discharge Plan: [place] Choice: [place] arrangements for the past 2 months: Single Family Home                 DME Arranged: Tub bench DME Agency: [employer] Date DME Agency Contacted: 03/01/21 Time DME Agency Contacted: [id number] Representative spoke with at DME Agency: Suanne Marker HH Arranged: PT, OT, RN, Nurse's Aide, Social Work [employer] Agency: [place] ([place]) Date [place]: 03/01/21 Time [place]: 96 Representative spoke with at [place]: [place] Arrangements/Services Living arrangements for the past 2 months: [place] with:: Self Patient language and need for interpreter reviewed:: Yes Do you feel safe going back to the place where you live?: Yes      Need for Family Participation in  Patient Care: Yes (Comment) Care giver support system in place?: Yes (comment)   Criminal Activity/Legal Involvement Pertinent to Current Situation/Hospitalization: No - Comment as needed  Activities of Daily Living Home Assistive Devices/Equipment: Cane (specify quad or straight) (doesnt use her cane regularly) ADL Screening (condition at time of admission) Patient's cognitive ability adequate to safely complete daily activities?: Yes Is the patient deaf or have difficulty hearing?: No Does the patient have difficulty seeing, even when wearing glasses/contacts?: Yes Does the patient have difficulty concentrating, remembering, or making decisions?: Yes Patient able to express need for assistance with ADLs?: Yes Does the patient have difficulty dressing or bathing?: Yes (takes longer than normal) Independently performs ADLs?: Yes (appropriate for developmental age) Does the patient have difficulty walking or climbing stairs?: Yes Weakness of Legs: Both Weakness of Arms/Hands: Both  Permission Sought/Granted         Permission granted to share info w AGENCY: HH        Emotional Assessment Appearance:: Appears stated age Attitude/Demeanor/Rapport: Gracious Affect (typically observed): Calm Orientation: : Oriented to Self, Oriented to Place, Oriented to  Time, Oriented to Situation Alcohol / Substance Use: Not Applicable Psych Involvement: No (comment)  Admission diagnosis:  Syncope and collapse [R55] Shortness of breath [R06.02] Hypokalemia [E87.6] Hypomagnesemia [E83.42] Rash [R21] AKI (acute kidney injury) ([place]) [N17.9] Fall, initial encounter [W19.XXXA] Patient Active Problem List   Diagnosis Date Noted   AKI (  acute kidney injury) ([place]) 02/26/2021   Hypokalemia 02/26/2021   Hypomagnesemia 02/26/2021   Bruising 02/26/2021   Syncope and collapse 02/26/2021   Prolonged QT interval 02/26/2021   Uncontrolled type 2 diabetes with eye complications 16/24/4695   DM  (diabetes mellitus), type 2 with peripheral vascular complications ([place]) 12/14/5748   Amputation of left foot ([place]) 12/02/2014   Benign essential HTN 12/02/2014   Depression, major, recurrent, mild ([place]) 12/02/2014   Dyslipidemia 12/02/2014   Vitamin B12 deficiency 12/02/2014   Peripheral neuropathic pain 12/02/2014   Peripheral artery disease ([place]) 12/02/2014   Phantom limb ([place]) 12/02/2014   Diabetes mellitus with nephropathy ([place]) 12/02/2014   Detached retina 12/02/2014   Type 2 diabetes mellitus with peripheral neuropathy ([place]) 12/02/2014   Tobacco use 12/02/2014   Urge incontinence 12/02/2014   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   Vitamin D deficiency 12/02/2014   PCP:  Steele Sizer, MD Pharmacy:   [id number] [id number]: [phone]: [phone]    Social Determinants of Health [place] Interventions    Readmission Risk Interventions No flowsheet data found.

## 2021-03-01 NOTE — Care Management Important Message (Signed)
Important Message  Patient Details  Name: Lisa [name] MRN: [id number] Date of Birth: 1945-06-03   Medicare Important Message Given:  Yes     [name] 03/01/2021, 12:00 PM

## 2021-03-01 NOTE — Progress Notes (Signed)
PROGRESS NOTE    Lisa [name]  WCH:[id number] DOB: June 12, 1945 DOA: 02/25/2021 PCP: Alba Cory, MD    Brief Narrative:  75 y.o. female wiith hx of blindness in the right eye, diabetes mellitus type 2, DVT, glaucoma, hypertension, depression, tobacco abuse who presented with complaints of a fall.   patient reports that she has difficulty moving her left leg after the fall.  she also reports hitting her head but did not pass out.  Patient started to develop shingles 3 months ago on her right lower back with pain radiating to her left buttock with similar episode few months ago.  She is not bedridden but has chronic urine incontinence and diarrhea.  pt also describes weakness and passing out.  Etiology of hypokalemia is unclear.  Aggressive replacement via IV at first.  However patient developed signs of fluid overload and intravenous fluid resuscitation was discontinued.  Potassium levels have recovered after aggressive oral replacement   Assessment & Plan:   Principal Problem:   Syncope and collapse Active Problems:   Benign essential HTN   Vitamin B12 deficiency   Peripheral artery disease (HCC)   Tobacco use   DM (diabetes mellitus), type 2 with peripheral vascular complications (HCC)   AKI (acute kidney injury) (HCC)   Hypokalemia   Hypomagnesemia   Bruising   Prolonged QT interval  Severe hypokalemia --K+ <2.0 on presentation. --started on IV potassium and NS with Kcl 40 mEq/L @ 250 ml/hr in the ED. Developed clinical signs of fluid overload Potassium improving.  3.4 as of 10/6, 3.8 on 10/7 Plan: No further potassium replacement today Recheck potassium in a.m. If potassium remains above 3.5 patient may be able to discharge home tomorrow   # Weakness and fall  --likely 2/2 severe hypokalemia -- Physical therapy evaluation -Home with home health   # Hypomag --mag 1.5 on presentation --replete with IV mag   # Syncope and collapse --likely due to electrolytes  abnormalities. --MRI brain neg acute finding -Therapy evaluations   # AKI --Cr 1.67 on presentation.  Baseline ~0.8. --AKI resolved the next day after IVF. Plan: No further IVF.  Avoid neurotoxins   # Painful rash or denuded skin --painful patches of skin under left buttock and left thigh.  Unclear etiology.  Given reported chronic urine and stool incontinence, could be a cause for skin breakdown. -Presentation inconsistent with shingles Plan: --wound care consult -Improved   Diabetes mellitus type 2 --A1c 6.2.  No need for BG checks.  Hypertension: Blood pressure low normal on presentation.  BP meds not started on admission. Plan: --resume losartan -Continue amlodipine 10 mg daily -Continue IV hydralazine as needed  B12 deficiency: --resume home B12 supplement after discharge.   Peripheral arterial disease: Stable patient being followed by vascular specialist. --resume ASA and plavix   Tobacco abuse: Nicotine patch.     Glaucoma --cont home eye drops   DVT prophylaxis: SQ Lovenox Code Status: Full Family Communication: Attempted to call brother [facility] [phone] on 10/5.  Busy signal Disposition Plan: Status is: Inpatient  Remains inpatient appropriate because:Inpatient level of care appropriate due to severity of illness  Dispo: The patient is from: Home              Anticipated d/c is to: Home              Patient currently is not medically stable to d/c.   Difficult to place patient No       Level of care: Progressive Cardiac  Consultants:  None   Procedures:  None   Antimicrobials: None   Subjective: Seen and examined.  Endorses unilateral right eye pain  Objective: Vitals:   02/28/21 2040 03/01/21 0407 03/01/21 0814 03/01/21 1125  BP: (!) 148/70 (!) 161/73 (!) 191/88 (!) 164/67  Pulse: 74 70 64 65  Resp: 18 17 16 16   Temp: 98 F (36.7 C) 97.8 F (36.6 C) 97.8 F (36.6 C) 98.2 F (36.8 C)  TempSrc:  Oral    SpO2: 100% 99%  100% 100%  Weight:      Height:        Intake/Output Summary (Last 24 hours) at 03/01/2021 1350 Last data filed at 03/01/2021 1022 Gross per 24 hour  Intake 240 ml  Output --  Net 240 ml   Filed Weights   02/27/21 0202  Weight: 60.3 kg    Examination:  General exam: No acute distress Respiratory system: Clear to auscultation. Respiratory effort normal. Cardiovascular system: S1-S2, RRR, no murmur, no pedal edema Gastrointestinal system: Soft, nontender, nondistended, normal bowel sounds Central nervous system: Alert and oriented. No focal neurological deficits. Extremities: Symmetric 5 x 5 power. Skin: Skin breakdown left buttock left thigh Psychiatry: Judgement and insight appear impaired. Mood & affect flattened.     Data Reviewed: I have personally reviewed following labs and imaging studies  CBC: Recent Labs  Lab 02/25/21 2238 02/26/21 0517 02/27/21 0252 02/28/21 0731 03/01/21 0638  WBC 11.0* 8.2 8.5 6.8 5.9  NEUTROABS 9.5*  --   --   --   --   HGB 10.6* 10.2* 11.3* 10.0* 10.7*  HCT 31.1* 29.7* 33.5* 30.1* 32.4*  MCV 83.2 81.6 85.5 83.1 85.0  PLT 246 230 276 243 255   Basic Metabolic Panel: Recent Labs  Lab 02/25/21 2238 02/26/21 0015 02/26/21 0517 02/26/21 1918 02/27/21 0252 02/28/21 0731 03/01/21 0638  NA 134*  --  139 138 141 138 137  K <2.0*  --  <2.0* 2.5* 2.8* 3.4* 3.8  CL 86*  --  95* 95* 95* 101 101  CO2 35*  --  35* 33* 34* 29 28  GLUCOSE 173*  --  165* 197* 155* 159* 144*  BUN 30*  --  24* 18 16 15 14   CREATININE 1.67*  --  1.16* 0.87 0.83 0.61 0.54  CALCIUM 7.8*  --  7.5* 7.7* 8.0* 8.0* 8.5*  MG 1.5*  --   --  2.4 2.2 1.8 1.7  PHOS  --  3.3  --   --  2.1* 2.1* 2.7   GFR: Estimated Creatinine Clearance: 52.5 mL/min (by C-G formula based on SCr of 0.54 mg/dL). Liver Function Tests: Recent Labs  Lab 02/25/21 2238 02/26/21 0517  AST 35 31  ALT 18 17  ALKPHOS 59 51  BILITOT 1.1 1.1  PROT 6.3* 5.3*  ALBUMIN 3.1* 2.6*   No  results for input(s): LIPASE, AMYLASE in the last 168 hours. No results for input(s): AMMONIA in the last 168 hours. Coagulation Profile: Recent Labs  Lab 02/25/21 2238 02/26/21 0517  INR 1.0 1.0   Cardiac Enzymes: No results for input(s): CKTOTAL, CKMB, CKMBINDEX, TROPONINI in the last 168 hours. BNP (last 3 results) No results for input(s): PROBNP in the last 8760 hours. HbA1C: No results for input(s): HGBA1C in the last 72 hours. CBG: Recent Labs  Lab 02/27/21 1504  GLUCAP 207*   Lipid Profile: No results for input(s): CHOL, HDL, LDLCALC, TRIG, CHOLHDL, LDLDIRECT in the last 72 hours. Thyroid Function Tests: No results  for input(s): TSH, T4TOTAL, FREET4, T3FREE, THYROIDAB in the last 72 hours.  Anemia Panel: No results for input(s): VITAMINB12, FOLATE, FERRITIN, TIBC, IRON, RETICCTPCT in the last 72 hours. Sepsis Labs: Recent Labs  Lab 02/25/21 2238  PROCALCITON 1.16    Recent Results (from the past 240 hour(s))  Resp Panel by RT-PCR (Flu A&B, Covid) Nasopharyngeal Swab     Status: None   Collection Time: 02/26/21 12:08 AM   Specimen: Nasopharyngeal Swab; Nasopharyngeal(NP) swabs in vial transport medium  Result Value Ref Range Status   SARS Coronavirus 2 by RT PCR NEGATIVE NEGATIVE Final    Comment: (NOTE) SARS-CoV-2 target nucleic acids are NOT DETECTED.  The SARS-CoV-2 RNA is generally detectable in upper respiratory specimens during the acute phase of infection. The lowest concentration of SARS-CoV-2 viral copies this assay can detect is 138 copies/mL. A negative result does not preclude SARS-Cov-2 infection and should not be used as the sole basis for treatment or other patient management decisions. A negative result may occur with  improper specimen collection/handling, submission of specimen other than nasopharyngeal swab, presence of viral mutation(s) within the areas targeted by this assay, and inadequate number of viral copies(<138 copies/mL). A  negative result must be combined with clinical observations, patient history, and epidemiological information. The expected result is Negative.  Fact Sheet for Patients:  BloggerCourse.com  Fact Sheet for Healthcare Providers:  SeriousBroker.it  This test is no t yet approved or cleared by the [place] FDA and  has been authorized for detection and/or diagnosis of SARS-CoV-2 by FDA under an Emergency Use Authorization (EUA). This EUA will remain  in effect (meaning this test can be used) for the duration of the COVID-19 declaration under Section 564(b)(1) of the Act, 21 U.S.C.section 360bbb-3(b)(1), unless the authorization is terminated  or revoked sooner.       Influenza A by PCR NEGATIVE NEGATIVE Final   Influenza B by PCR NEGATIVE NEGATIVE Final    Comment: (NOTE) The Xpert Xpress SARS-CoV-2/FLU/RSV plus assay is intended as an aid in the diagnosis of influenza from Nasopharyngeal swab specimens and should not be used as a sole basis for treatment. Nasal washings and aspirates are unacceptable for Xpert Xpress SARS-CoV-2/FLU/RSV testing.  Fact Sheet for Patients: BloggerCourse.com  Fact Sheet for Healthcare Providers: SeriousBroker.it  This test is not yet approved or cleared by the [place] FDA and has been authorized for detection and/or diagnosis of SARS-CoV-2 by FDA under an Emergency Use Authorization (EUA). This EUA will remain in effect (meaning this test can be used) for the duration of the COVID-19 declaration under Section 564(b)(1) of the Act, 21 U.S.C. section 360bbb-3(b)(1), unless the authorization is terminated or revoked.  Performed at [facility], [address] [place], [place] [address]   CULTURE, BLOOD (ROUTINE X 2) w Reflex to ID Panel     Status: None (Preliminary result)   Collection Time: 02/26/21 12:08 AM   Specimen: BLOOD   Result Value Ref Range Status   Specimen Description BLOOD RIGHT ASSIST CONTROL  Final   Special Requests   Final    BOTTLES DRAWN AEROBIC AND ANAEROBIC Blood Culture adequate volume   Culture   Final    NO GROWTH 3 DAYS Performed at [facility], [address] [place], [place] [address]    Report Status PENDING  Incomplete  CULTURE, BLOOD (ROUTINE X 2) w Reflex to ID Panel     Status: None (Preliminary result)   Collection Time: 02/26/21 12:27 AM  Specimen: BLOOD  Result Value Ref Range Status   Specimen Description BLOOD LEFT ASSIST CONTROL  Final   Special Requests   Final    BOTTLES DRAWN AEROBIC AND ANAEROBIC Blood Culture results may not be optimal due to an inadequate volume of blood received in culture bottles   Culture   Final    NO GROWTH 3 DAYS Performed at [facility], [address], [place], [place] [address]    Report Status PENDING  Incomplete         Radiology Studies: US Venous Img Upper Uni Right(DVT)  Result Date: 02/28/2021 CLINICAL DATA:  Edema near IV site, diabetes, history of tobacco abuse EXAM: RIGHT UPPER EXTREMITY VENOUS DOPPLER ULTRASOUND TECHNIQUE: Gray-scale sonography with graded compression, as well as color Doppler and duplex ultrasound were performed to evaluate the upper extremity deep venous system from the level of the subclavian vein and including the jugular, axillary, basilic, radial, ulnar and upper cephalic vein. Spectral Doppler was utilized to evaluate flow at rest and with distal augmentation maneuvers. COMPARISON:  None. FINDINGS: Contralateral Subclavian Vein: Respiratory phasicity is normal and symmetric with the symptomatic side. No evidence of thrombus. Normal compressibility. Internal Jugular Vein: No evidence of thrombus. Normal compressibility, respiratory phasicity and response to augmentation. Subclavian Vein: No evidence of thrombus. Normal compressibility, respiratory phasicity and response to  augmentation. Axillary Vein: No evidence of thrombus. Normal compressibility, respiratory phasicity and response to augmentation. Cephalic Vein: Segmental thrombosis spanning approximately 3 cm in the distal segment, patent more centrally. Basilic Vein: No evidence of thrombus. Normal compressibility, respiratory phasicity and response to augmentation. Brachial Veins: No evidence of thrombus. Normal compressibility, respiratory phasicity and response to augmentation. Radial Veins: No evidence of thrombus. Normal compressibility, respiratory phasicity and response to augmentation. Ulnar Veins: No evidence of thrombus. Normal compressibility, respiratory phasicity and response to augmentation. Venous Reflux:  None visualized. Other Findings:  None visualized. IMPRESSION: 1. Negative for right upper extremity DVT. 2. Segmental superficial thrombophlebitis involving the distal cephalic vein near the IV site. Electronically Signed   By: Corlis Leak M.D.   On: 02/28/2021 12:51        Scheduled Meds:  amLODipine  10 mg Oral Daily   aspirin EC  81 mg Oral Daily   atropine  1 drop Right Eye TID   brimonidine  1 drop Both Eyes BID   citalopram  20 mg Oral Daily   clopidogrel  75 mg Oral Daily   dorzolamide-timolol  1 drop Both Eyes BID   enoxaparin (LOVENOX) injection  40 mg Subcutaneous Q24H   gabapentin  300 mg Oral BID   liver oil-zinc oxide   Topical BID   losartan  100 mg Oral Daily   nicotine  21 mg Transdermal Daily   rosuvastatin  10 mg Oral Daily   Continuous Infusions:   LOS: 3 days    Time spent: 25 minutes    Tresa Moore, MD [facility]   If 7PM-7AM, please contact night-coverage  03/01/2021, 1:50 PM

## 2021-03-02 LAB — CBC
HCT: 33.4 % — ABNORMAL LOW (ref 36.0–46.0)
Hemoglobin: 11.1 g/dL — ABNORMAL LOW (ref 12.0–15.0)
MCH: 28.5 pg (ref 26.0–34.0)
MCHC: 33.2 g/dL (ref 30.0–36.0)
MCV: 85.9 fL (ref 80.0–100.0)
Platelets: 282 10*3/uL (ref 150–400)
RBC: 3.89 MIL/uL (ref 3.87–5.11)
RDW: 17.2 % — ABNORMAL HIGH (ref 11.5–15.5)
WBC: 6.1 10*3/uL (ref 4.0–10.5)
nRBC: 0 % (ref 0.0–0.2)

## 2021-03-02 LAB — BASIC METABOLIC PANEL
Anion gap: 8 (ref 5–15)
BUN: 15 mg/dL (ref 8–23)
CO2: 26 mmol/L (ref 22–32)
Calcium: 8.5 mg/dL — ABNORMAL LOW (ref 8.9–10.3)
Chloride: 103 mmol/L (ref 98–111)
Creatinine, Ser: 0.63 mg/dL (ref 0.44–1.00)
GFR, Estimated: >60 mL/min (ref >60)
Glucose, Bld: 153 mg/dL — ABNORMAL HIGH (ref 70–99)
Potassium: 3.8 mmol/L (ref 3.5–5.1)
Sodium: 137 mmol/L (ref 135–145)

## 2021-03-02 LAB — MAGNESIUM: Magnesium: 1.6 mg/dL — ABNORMAL LOW (ref 1.7–2.4)

## 2021-03-02 LAB — PHOSPHORUS: Phosphorus: 3.7 mg/dL (ref 2.5–4.6)

## 2021-03-02 MED ORDER — LOPERAMIDE HCL 2 MG PO CAPS
2.0000 mg | ORAL_CAPSULE | ORAL | Status: DC | PRN
  Administered 2021-03-02: 2 mg via ORAL
  Filled 2021-03-02: qty 1

## 2021-03-02 NOTE — Plan of Care (Signed)
  Problem: Education: Goal: Knowledge of condition and prescribed therapy will improve 03/02/2021 0904 by Ansel Bong, RN Outcome: Progressing 03/02/2021 0904 by Ansel Bong, RN Outcome: Progressing   Problem: Cardiac: Goal: Will achieve and/or maintain adequate cardiac output 03/02/2021 0904 by Ansel Bong, RN Outcome: Progressing 03/02/2021 0904 by Ansel Bong, RN Outcome: Progressing   Problem: Physical Regulation: Goal: Complications related to the disease process, condition or treatment will be avoided or minimized 03/02/2021 0904 by Ansel Bong, RN Outcome: Progressing 03/02/2021 0904 by Ansel Bong, RN Outcome: Progressing

## 2021-03-02 NOTE — Progress Notes (Signed)
PT Cancellation Note  Patient Details Name: Lisa [name] MRN: [id number] DOB: Feb 26, 1946   Cancelled Treatment:     PT attempt. Pt politely refused requesting Thereasa Parkin return later this afternoon. Acute PT will continue to follow and progress as able per current POC.    Rushie Chestnut 03/02/2021, 11:52 AM

## 2021-03-02 NOTE — Progress Notes (Signed)
Physical Therapy Treatment Patient Details Name: Lisa [name] MRN: [id number] DOB: [birth year] Today's Date: 03/02/2021   History of Present Illness Lisa [name] is a 75 y.o. female wiith hx of blindness in the right eye, diabetes mellitus type 2, DVT, glaucoma, hypertension, depression, LLE transmetatarsal amputation, and tobacco abuse who presented with complaints of a fall.    PT Comments    Pt was long sitting in bed with supportive brother at bedside upon arriving. She agrees to session but is very talkative and needs redirecting to stay focused on desired task. She was able to exit bed, stand, and ambulate with RW with supervision. Ambulated 200 ft with RW then 200 ft with HHA +1. Pt c/o eye discomfort throughout session. RN aware. Overall pt is doing well from a PT standpoint. Do recommend DC home with HHPT to address balance, strength, and activity tolerance deficits. She was in bed post session with bed alarm in place and call bell in reach. Acute PT will continue to follow per current POC.    Recommendations for follow up therapy are one component of a multi-disciplinary discharge planning process, led by the attending physician.  Recommendations may be updated based on patient status, additional functional criteria and insurance authorization.  Follow Up Recommendations  Home health PT     Equipment Recommendations  Rolling walker with 5" wheels       Precautions / Restrictions Precautions Precautions: Fall Precaution Comments: skin integrity on back/buttocks. Poor vision Restrictions Weight Bearing Restrictions: No     Mobility  Bed Mobility Overal bed mobility: Needs Assistance Bed Mobility: Supine to Sit     Supine to sit: Supervision Sit to supine: Supervision   General bed mobility comments: increased time to perform but was able to perform without physical assistance    Transfers Overall transfer level: Needs assistance Equipment used: Rolling walker (2  wheeled) Transfers: Sit to/from Stand Sit to Stand: Supervision            Ambulation/Gait Ambulation/Gait assistance: Min guard;Supervision Gait Distance (Feet): 400 Feet Assistive device: Rolling walker (2 wheeled);1 person hand held assist Gait Pattern/deviations: Step-through pattern Gait velocity: WNL   General Gait Details: pt ambulated with RW x 200 ft then hha +1 200 ft. she tolerated well but does have some unsteadiness and fatigue without UE support. She c/o vision deficits throughout gait training. RN informed of request for eye drops    Balance Overall balance assessment: Needs assistance Sitting-balance support: No upper extremity supported;Feet supported Sitting balance-Leahy Scale: Good     Standing balance support: Bilateral upper extremity supported;During functional activity Standing balance-Leahy Scale: Fair      Cognition Arousal/Alertness: Awake/alert Behavior During Therapy: WFL for tasks assessed/performed Overall Cognitive Status: Within Functional Limits for tasks assessed    General Comments: Pt is very talkative and needs redirecting to focus on desired task.             Pertinent Vitals/Pain Pain Assessment: No/denies pain Faces Pain Scale: No hurt     PT Goals (current goals can now be found in the care plan section) Acute Rehab PT Goals Patient Stated Goal: to go home Progress towards PT goals: Progressing toward goals    Frequency    Min 2X/week      PT Plan Current plan remains appropriate       AM-PAC PT "6 Clicks" Mobility   Outcome Measure  Help needed turning from your back to your side while in a flat bed without using bedrails?: None  Help needed moving from lying on your back to sitting on the side of a flat bed without using bedrails?: None Help needed moving to and from a bed to a chair (including a wheelchair)?: None Help needed standing up from a chair using your arms (e.g., wheelchair or bedside chair)?: A  Little Help needed to walk in hospital room?: A Little Help needed climbing 3-5 steps with a railing? : A Little 6 Click Score: 21    End of Session Equipment Utilized During Treatment: Gait belt Activity Tolerance: Patient tolerated treatment well Patient left: in bed;with call bell/phone within reach;with bed alarm set;with family/visitor present (brother in room at start of session but quickly leaves) Nurse Communication: Mobility status PT Visit Diagnosis: Difficulty in walking, not elsewhere classified (R26.2);Pain Pain - Right/Left: Left Pain - part of body: Leg     Time: 1405-1430 PT Time Calculation (min) (ACUTE ONLY): 25 min  Charges:  $Gait Training: 23-37 mins                     Jetta Lout PTA 03/02/21, 4:04 PM

## 2021-03-02 NOTE — TOC Progression Note (Signed)
Transition of Care [facility]) - Progression Note    Patient Details  Name: Lisa [name] MRN: [id number] Date of Birth: [birth year]  [facility] CM/SW Contact  Bing Quarry, RN Phone Number: 03/02/2021, 4:56 PM  Clinical Narrative: Patient family requested start of service date for [facility]. Contacted [facility] and was told it would be within 48 hours unless they needed next day. Contacted Lucina Mellow, who indicated they did need next day start of service. Relayed this to [facility] on call for [facility] just now. Anticipated DC Sunday 10/9. Gabriel Cirri RN CM       Expected Discharge Plan: Home w Home Health Services Barriers to Discharge: Continued Medical Work up  Expected Discharge Plan and Services Expected Discharge Plan: Home w Home Health Services     Post Acute Care Choice: Home Health Living arrangements for the past 2 months: Single Family Home                 DME Arranged: Tub bench DME Agency: [employer] Date DME Agency Contacted: 03/01/21 Time DME Agency Contacted: 1145 Representative spoke with at DME Agency: Bjorn Loser HH Arranged: PT, OT, RN, Nurse's Aide, Social Work [employer] Agency: [facility] Date HH Agency Contacted: 03/01/21 Time HH Agency Contacted: 1144 Representative spoke with at [facility] Agency: Pearson Grippe   Social Determinants of Health (SDOH) Interventions    Readmission Risk Interventions No flowsheet data found.

## 2021-03-02 NOTE — Progress Notes (Signed)
PROGRESS NOTE    Lisa [name]  HEN:[id number] DOB: 1945/11/19 DOA: 02/25/2021 PCP: Alba Cory, MD    Brief Narrative:  75 y.o. female wiith hx of blindness in the right eye, diabetes mellitus type 2, DVT, glaucoma, hypertension, depression, tobacco abuse who presented with complaints of a fall.   patient reports that she has difficulty moving her left leg after the fall.  she also reports hitting her head but did not pass out.  Patient started to develop shingles 3 months ago on her right lower back with pain radiating to her left buttock with similar episode few months ago.  She is not bedridden but has chronic urine incontinence and diarrhea.  pt also describes weakness and passing out.  Etiology of hypokalemia is unclear.  Aggressive replacement via IV at first.  However patient developed signs of fluid overload and intravenous fluid resuscitation was discontinued.  Potassium levels have recovered after aggressive oral replacement   Assessment & Plan:   Principal Problem:   Syncope and collapse Active Problems:   Benign essential HTN   Vitamin B12 deficiency   Peripheral artery disease (HCC)   Tobacco use   DM (diabetes mellitus), type 2 with peripheral vascular complications (HCC)   AKI (acute kidney injury) (HCC)   Hypokalemia   Hypomagnesemia   Bruising   Prolonged QT interval  Severe hypokalemia --K+ <2.0 on presentation. --started on IV potassium and NS with Kcl 40 mEq/L @ 250 ml/hr in the ED. Developed clinical signs of fluid overload Potassium improving.  3.4 as of 10/6, 3.8 on 10/7, 3.8 on 10/8 Plan: No further potassium replacement today Recheck potassium in a.m. If K> 3.5, discharged home on 10/9   # Weakness and fall  --likely 2/2 severe hypokalemia -- Physical therapy evaluation -Home with home health -Home health unable to start over the weekend and discharge home without support services likely not a safe disposition.  We will plan to monitor  overnight and discharge home on 10/9   # Hypomag --mag 1.5 on presentation --replete with IV mag   # Syncope and collapse --likely due to electrolytes abnormalities. --MRI brain neg acute finding -Therapy evaluations   # AKI --Cr 1.67 on presentation.  Baseline ~0.8. --AKI resolved the next day after IVF. Plan: No further IVF.  Avoid neurotoxins   # Painful rash or denuded skin --painful patches of skin under left buttock and left thigh.  Unclear etiology.  Given reported chronic urine and stool incontinence, could be a cause for skin breakdown. -Presentation inconsistent with shingles Plan: --wound care consult -Improved   Diabetes mellitus type 2 --A1c 6.2.  No need for BG checks.  Hypertension: Blood pressure low normal on presentation.  BP meds not started on admission. Plan: --resume losartan -Continue amlodipine 10 mg daily -Continue IV hydralazine as needed  B12 deficiency: --resume home B12 supplement after discharge.   Peripheral arterial disease: Stable patient being followed by vascular specialist. --resume ASA and plavix   Tobacco abuse: Nicotine patch.     Glaucoma --cont home eye drops   DVT prophylaxis: SQ Lovenox Code Status: Full Family Communication: Attempted to call brother Roger [facility] [phone] on 10/5.  Busy signal Disposition Plan: Status is: Inpatient  Remains inpatient appropriate because:Inpatient level of care appropriate due to severity of illness  Dispo: The patient is from: Home              Anticipated d/c is to: Home  Patient currently is not medically stable to d/c.   Difficult to place patient No       Level of care: Progressive Cardiac  Consultants:  None   Procedures:  None   Antimicrobials: None   Subjective: Seen and examined.  Endorses blurred vision in right eye  Objective: Vitals:   03/02/21 0354 03/02/21 0557 03/02/21 0739 03/02/21 1154  BP: (!) 172/71 (!) 129/51 (!) 139/57 (!)  103/51  Pulse: 65 72 72 69  Resp: 16  16 18   Temp: 98.9 F (37.2 C)  98.5 F (36.9 C) 97.8 F (36.6 C)  TempSrc:   Oral   SpO2: 98%  100% 100%  Weight:      Height:        Intake/Output Summary (Last 24 hours) at 03/02/2021 1213 Last data filed at 03/02/2021 0930 Gross per 24 hour  Intake 480 ml  Output 900 ml  Net -420 ml   Filed Weights   02/27/21 0202  Weight: 60.3 kg    Examination:  General exam: No acute distress Respiratory system: Clear to auscultation. Respiratory effort normal. Cardiovascular system: S1-S2, RRR, no murmur, no pedal edema Gastrointestinal system: Soft, nontender, nondistended, normal bowel sounds Central nervous system: Alert and oriented. No focal neurological deficits. Extremities: Symmetric 5 x 5 power. Skin: Skin breakdown left buttock left thigh Psychiatry: Judgement and insight appear impaired. Mood & affect flattened.     Data Reviewed: I have personally reviewed following labs and imaging studies  CBC: Recent Labs  Lab 02/25/21 2238 02/26/21 0517 02/27/21 0252 02/28/21 0731 03/01/21 0638 03/02/21 0546  WBC 11.0* 8.2 8.5 6.8 5.9 6.1  NEUTROABS 9.5*  --   --   --   --   --   HGB 10.6* 10.2* 11.3* 10.0* 10.7* 11.1*  HCT 31.1* 29.7* 33.5* 30.1* 32.4* 33.4*  MCV 83.2 81.6 85.5 83.1 85.0 85.9  PLT 246 230 276 243 255 282   Basic Metabolic Panel: Recent Labs  Lab 02/26/21 0015 02/26/21 0517 02/26/21 1918 02/27/21 0252 02/28/21 0731 03/01/21 0638 03/02/21 0546  NA  --    < > 138 141 138 137 137  K  --    < > 2.5* 2.8* 3.4* 3.8 3.8  CL  --    < > 95* 95* 101 101 103  CO2  --    < > 33* 34* 29 28 26   GLUCOSE  --    < > 197* 155* 159* 144* 153*  BUN  --    < > 18 16 15 14 15   CREATININE  --    < > 0.87 0.83 0.61 0.54 0.63  CALCIUM  --    < > 7.7* 8.0* 8.0* 8.5* 8.5*  MG  --   --  2.4 2.2 1.8 1.7 1.6*  PHOS 3.3  --   --  2.1* 2.1* 2.7 3.7   < > = values in this interval not displayed.   GFR: Estimated Creatinine  Clearance: 52.5 mL/min (by C-G formula based on SCr of 0.63 mg/dL). Liver Function Tests: Recent Labs  Lab 02/25/21 2238 02/26/21 0517  AST 35 31  ALT 18 17  ALKPHOS 59 51  BILITOT 1.1 1.1  PROT 6.3* 5.3*  ALBUMIN 3.1* 2.6*   No results for input(s): LIPASE, AMYLASE in the last 168 hours. No results for input(s): AMMONIA in the last 168 hours. Coagulation Profile: Recent Labs  Lab 02/25/21 2238 02/26/21 0517  INR 1.0 1.0   Cardiac Enzymes: No results for  input(s): CKTOTAL, CKMB, CKMBINDEX, TROPONINI in the last 168 hours. BNP (last 3 results) No results for input(s): PROBNP in the last 8760 hours. HbA1C: No results for input(s): HGBA1C in the last 72 hours. CBG: Recent Labs  Lab 02/27/21 1504  GLUCAP 207*   Lipid Profile: No results for input(s): CHOL, HDL, LDLCALC, TRIG, CHOLHDL, LDLDIRECT in the last 72 hours. Thyroid Function Tests: No results for input(s): TSH, T4TOTAL, FREET4, T3FREE, THYROIDAB in the last 72 hours.  Anemia Panel: No results for input(s): VITAMINB12, FOLATE, FERRITIN, TIBC, IRON, RETICCTPCT in the last 72 hours. Sepsis Labs: Recent Labs  Lab 02/25/21 2238  PROCALCITON 1.16    Recent Results (from the past 240 hour(s))  Resp Panel by RT-PCR (Flu A&B, Covid) Nasopharyngeal Swab     Status: None   Collection Time: 02/26/21 12:08 AM   Specimen: Nasopharyngeal Swab; Nasopharyngeal(NP) swabs in vial transport medium  Result Value Ref Range Status   SARS Coronavirus 2 by RT PCR NEGATIVE NEGATIVE Final    Comment: (NOTE) SARS-CoV-2 target nucleic acids are NOT DETECTED.  The SARS-CoV-2 RNA is generally detectable in upper respiratory specimens during the acute phase of infection. The lowest concentration of SARS-CoV-2 viral copies this assay can detect is 138 copies/mL. A negative result does not preclude SARS-Cov-2 infection and should not be used as the sole basis for treatment or other patient management decisions. A negative result may  occur with  improper specimen collection/handling, submission of specimen other than nasopharyngeal swab, presence of viral mutation(s) within the areas targeted by this assay, and inadequate number of viral copies(<138 copies/mL). A negative result must be combined with clinical observations, patient history, and epidemiological information. The expected result is Negative.  Fact Sheet for Patients:  BloggerCourse.com  Fact Sheet for Healthcare Providers:  SeriousBroker.it  This test is no t yet approved or cleared by the [place] FDA and  has been authorized for detection and/or diagnosis of SARS-CoV-2 by FDA under an Emergency Use Authorization (EUA). This EUA will remain  in effect (meaning this test can be used) for the duration of the COVID-19 declaration under Section 564(b)(1) of the Act, 21 U.S.C.section 360bbb-3(b)(1), unless the authorization is terminated  or revoked sooner.       Influenza A by PCR NEGATIVE NEGATIVE Final   Influenza B by PCR NEGATIVE NEGATIVE Final    Comment: (NOTE) The Xpert Xpress SARS-CoV-2/FLU/RSV plus assay is intended as an aid in the diagnosis of influenza from Nasopharyngeal swab specimens and should not be used as a sole basis for treatment. Nasal washings and aspirates are unacceptable for Xpert Xpress SARS-CoV-2/FLU/RSV testing.  Fact Sheet for Patients: BloggerCourse.com  Fact Sheet for Healthcare Providers: SeriousBroker.it  This test is not yet approved or cleared by the [place] FDA and has been authorized for detection and/or diagnosis of SARS-CoV-2 by FDA under an Emergency Use Authorization (EUA). This EUA will remain in effect (meaning this test can be used) for the duration of the COVID-19 declaration under Section 564(b)(1) of the Act, 21 U.S.C. section 360bbb-3(b)(1), unless the authorization is terminated  or revoked.  Performed at [facility], [address] [place], [place] [address]   CULTURE, BLOOD (ROUTINE X 2) w Reflex to ID Panel     Status: None (Preliminary result)   Collection Time: 02/26/21 12:08 AM   Specimen: BLOOD  Result Value Ref Range Status   Specimen Description BLOOD RIGHT ASSIST CONTROL  Final   Special Requests   Final  BOTTLES DRAWN AEROBIC AND ANAEROBIC Blood Culture adequate volume   Culture   Final    NO GROWTH 4 DAYS Performed at [facility], [address] [place], [place] [address]    Report Status PENDING  Incomplete  CULTURE, BLOOD (ROUTINE X 2) w Reflex to ID Panel     Status: None (Preliminary result)   Collection Time: 02/26/21 12:27 AM   Specimen: BLOOD  Result Value Ref Range Status   Specimen Description BLOOD LEFT ASSIST CONTROL  Final   Special Requests   Final    BOTTLES DRAWN AEROBIC AND ANAEROBIC Blood Culture results may not be optimal due to an inadequate volume of blood received in culture bottles   Culture   Final    NO GROWTH 4 DAYS Performed at [facility], [address], [place], [place] [address]    Report Status PENDING  Incomplete         Radiology Studies: No results found.      Scheduled Meds:  amLODipine  10 mg Oral Daily   aspirin EC  81 mg Oral Daily   atropine  1 drop Right Eye TID   brimonidine  1 drop Both Eyes BID   citalopram  20 mg Oral Daily   clopidogrel  75 mg Oral Daily   dorzolamide-timolol  1 drop Both Eyes BID   enoxaparin (LOVENOX) injection  40 mg Subcutaneous Q24H   gabapentin  300 mg Oral BID   liver oil-zinc oxide   Topical BID   losartan  100 mg Oral Daily   nicotine  21 mg Transdermal Daily   rosuvastatin  10 mg Oral Daily   Continuous Infusions:   LOS: 4 days    Time spent: 15 minutes    Tresa Moore, MD [facility]   If 7PM-7AM, please contact night-coverage  03/02/2021, 12:13 PM

## 2021-03-03 LAB — CBC
HCT: 31.4 % — ABNORMAL LOW (ref 36.0–46.0)
Hemoglobin: 10.6 g/dL — ABNORMAL LOW (ref 12.0–15.0)
MCH: 28.5 pg (ref 26.0–34.0)
MCHC: 33.8 g/dL (ref 30.0–36.0)
MCV: 84.4 fL (ref 80.0–100.0)
Platelets: 272 10*3/uL (ref 150–400)
RBC: 3.72 MIL/uL — ABNORMAL LOW (ref 3.87–5.11)
RDW: 17.2 % — ABNORMAL HIGH (ref 11.5–15.5)
WBC: 6.9 10*3/uL (ref 4.0–10.5)
nRBC: 0 % (ref 0.0–0.2)

## 2021-03-03 LAB — BASIC METABOLIC PANEL
Anion gap: 3 — ABNORMAL LOW (ref 5–15)
BUN: 12 mg/dL (ref 8–23)
CO2: 28 mmol/L (ref 22–32)
Calcium: 8.2 mg/dL — ABNORMAL LOW (ref 8.9–10.3)
Chloride: 109 mmol/L (ref 98–111)
Creatinine, Ser: 0.68 mg/dL (ref 0.44–1.00)
GFR, Estimated: >60 mL/min (ref >60)
Glucose, Bld: 109 mg/dL — ABNORMAL HIGH (ref 70–99)
Potassium: 3.9 mmol/L (ref 3.5–5.1)
Sodium: 140 mmol/L (ref 135–145)

## 2021-03-03 LAB — CULTURE, BLOOD (ROUTINE X 2)
Culture: NO GROWTH
Culture: NO GROWTH
Special Requests: ADEQUATE

## 2021-03-03 LAB — MAGNESIUM: Magnesium: 1.6 mg/dL — ABNORMAL LOW (ref 1.7–2.4)

## 2021-03-03 LAB — PHOSPHORUS: Phosphorus: 3.9 mg/dL (ref 2.5–4.6)

## 2021-03-03 MED ORDER — MAGNESIUM SULFATE 2 GM/50ML IV SOLN
2.0000 g | Freq: Once | INTRAVENOUS | Status: DC
  Filled 2021-03-03: qty 50

## 2021-03-03 MED ORDER — METAMUCIL 0.52 G PO CAPS: 0.5200 g | ORAL_CAPSULE | Freq: Every day | ORAL | 0 refills | Status: DC

## 2021-03-03 NOTE — TOC Transition Note (Signed)
Transition of Care [facility]) - CM/SW Discharge Note   Patient Details  Name: Lisa [name] MRN: [id number] Date of Birth: [birth year]  Transition of Care [facility]) CM/SW Contact:  Bing Quarry, RN Phone Number: 03/03/2021, 9:18 AM   Clinical Narrative: Patient has discharge orders in. [facility] notified via Werner Lean, on call for [employer]. Tub bench ordered 10/7 via [employer]. Patient noted to have a walker at home. Spoke with family 10/8 late afternoon regarding start of service and discharge today. Kenzie at [facility] notified of need to see patient within 24 hours on 03/02/21 after speaking to family member. Patient will have another brother living in same residence on discharge per brother Derrill Center.    Gabriel Cirri RN CM     Final next level of care: Home w Home Health Services Barriers to Discharge: Barriers Resolved   Patient Goals and CMS Choice Patient states their goals for this hospitalization and ongoing recovery are:: to go home CMS Medicare.gov Compare Post Acute Care list provided to:: Patient Choice offered to / list presented to : Patient  Discharge Placement                       Discharge Plan and Services     Post Acute Care Choice: Home Health          DME Arranged: Tub bench (Patient has a walker per CM note.) DME Agency: [employer] Date DME Agency Contacted: 03/01/21 Time DME Agency Contacted: 1145 Representative spoke with at DME Agency: Bjorn Loser HH Arranged: PT, OT, RN, Nurse's Aide, Social Work [employer] Agency: [facility] Date HH Agency Contacted: 03/01/21 Time HH Agency Contacted: 1144 Representative spoke with at [facility] Agency:  (Confirmed DC today with Pearson Grippe at [facility])  Social Determinants of Health (SDOH) Interventions     Readmission Risk Interventions No flowsheet data found.

## 2021-03-03 NOTE — Progress Notes (Signed)
Attempted to call patient's brother to notify of discharge and inquire about transportation home.  No answer.  Will attempt to call back later this morning 03/03/21.    Also, patient's tub bench is at bedside.

## 2021-03-03 NOTE — Progress Notes (Signed)
Patient discharged.  AVS reviewed and in patient belongings bag along with eye drops.  Patient has house keys and hat in hand.  Patient taken to private vehicle via wheelchair with staff present on room air.  Alert and oriented.  She was assisted into family private vehicle.  Shower/ Tub bench also sent with patient.

## 2021-03-03 NOTE — Discharge Summary (Signed)
Physician Discharge Summary  Lisa [name] WNI:[id number] DOB: [birth year] DOA: 02/25/2021  PCP: Steele Sizer, MD  Admit date: 02/25/2021 Discharge date: 03/03/2021  Admitted From: Home Disposition: Home Home health  Recommendations for Outpatient Follow-up:  Follow up with PCP in 1-2 weeks   Home Health: Yes, PT OT RN aide Equipment/Devices: None  Discharge Condition: Stable CODE STATUS: Full Diet recommendation: Regular  Brief/Interim Summary: 75 y.o. female wiith hx of blindness in the right eye, diabetes mellitus type 2, DVT, glaucoma, hypertension, depression, tobacco abuse who presented with complaints of a fall.   patient reports that she has difficulty moving her left leg after the fall.  she also reports hitting her head but did not pass out.  Patient started to develop shingles 3 months ago on her right lower back with pain radiating to her left buttock with similar episode few months ago.  She is not bedridden but has chronic urine incontinence and diarrhea.  pt also describes weakness and passing out.   Etiology of hypokalemia is unclear.  Aggressive replacement via IV at first.  However patient developed signs of fluid overload and intravenous fluid resuscitation was discontinued.  Potassium levels have recovered after aggressive oral replacement    Discharge Diagnoses:  Principal Problem:   Syncope and collapse Active Problems:   Benign essential HTN   Vitamin B12 deficiency   Peripheral artery disease (HCC)   Tobacco use   DM (diabetes mellitus), type 2 with peripheral vascular complications (HCC)   AKI (acute kidney injury) ([place])   Hypokalemia   Hypomagnesemia   Bruising   Prolonged QT interval  Severe hypokalemia --K+ <2.0 on presentation. --started on IV potassium and NS with Kcl 40 mEq/L @ 250 ml/hr in the ED. Developed clinical signs of fluid overload Potassium improving.  3.4 as of 10/6, 3.8 on 10/7, 3.8 on 10/8 Plan: Will discontinue oral  potassium replacement discharge.  Recommend dietary potassium replacement.  Potassium has been stable for the last 48 hours without any aggressive replacement in the hospital.   # Weakness and fall  --likely 2/2 severe hypokalemia -- Physical therapy evaluation -Home with home health   Discharge Instructions  Discharge Instructions     Diet - low sodium heart healthy   Complete by: As directed    Increase activity slowly   Complete by: As directed    No wound care   Complete by: As directed       Allergies as of 03/03/2021       Reactions   Other Itching   Nucinta   Tapentadol Hcl         Medication List     STOP taking these medications    augmented betamethasone dipropionate 0.05 % cream Commonly known as: Diprolene AF   potassium chloride SA 20 MEQ tablet Commonly known as: KLOR-CON   triamcinolone ointment 0.1 % Commonly known as: KENALOG       TAKE these medications    Accu-Chek Aviva Connect w/Device Kit 1 each by Does not apply route daily. And supplies E11.9 check fsbs bid   aspirin 81 MG tablet Take 81 mg by mouth daily.   atropine 1 % ophthalmic solution Place 1 drop into the right eye 3 (three) times daily.   B-12 1000 MCG Subl Place 1 tablet under the tongue daily.   brimonidine 0.2 % ophthalmic solution Commonly known as: ALPHAGAN Place 1 drop into both eyes 2 (two) times daily.   citalopram 20 MG tablet Commonly known as: CELEXA  Take 1 tablet (20 mg total) by mouth daily.   clopidogrel 75 MG tablet Commonly known as: PLAVIX Take 1 tablet (75 mg total) by mouth daily.   hydrochlorothiazide 12.5 MG tablet Commonly known as: HYDRODIURIL Take 1 tablet (12.5 mg total) by mouth daily.   Januvia 100 MG tablet Generic drug: sitaGLIPtin TAKE 1 TABLET BY MOUTH EVERY DAY   losartan 100 MG tablet Commonly known as: COZAAR Take 1 tablet (100 mg total) by mouth daily.   Metamucil 0.52 g capsule Generic drug: psyllium Take 1  capsule (0.52 g total) by mouth daily.   multivitamin tablet Take 1 tablet by mouth daily.   pioglitazone 15 MG tablet Commonly known as: Actos Take 1 tablet (15 mg total) by mouth daily.   rosuvastatin 10 MG tablet Commonly known as: CRESTOR Take 1 tablet (10 mg total) by mouth daily.   Vitamin D (Ergocalciferol) 1.25 MG (50000 UNIT) Caps capsule Commonly known as: DRISDOL TAKE 1 CAPSULE (50,000 UNITS TOTAL) BY MOUTH EVERY 7 (SEVEN) DAYS.               Durable Medical Equipment  (From admission, onward)           Start     Ordered   03/01/21 1155  For home use only DME Tub bench  Once        03/01/21 1154            Follow-up Information     Sowles, Krichna, MD. Schedule an appointment as soon as possible for a visit in 1 week(s).   Specialty: Family Medicine Contact information: [address] [place] [address] [phone]         Kalisetti, Radhika, MD Follow up.   Specialty: Internal Medicine Why: Primary care at [facility] in [place].  Please call this office if you would like to establish a new primary care here Contact information: [address] [place] [address] [phone]                Allergies  Allergen Reactions   Other Itching    Nucinta   Tapentadol Hcl     Consultations: None   Procedures/Studies: DG Chest 2 View  Result Date: 02/25/2021 CLINICAL DATA:  Fall EXAM: CHEST - 2 VIEW COMPARISON:  09/07/2009 FINDINGS: Mild cardiomegaly. Aortic atherosclerosis. Focal area of relatively increased density at the left lung base on frontal view is favored to be secondary to the overlapping anterior left sixth and posterior left ninth ribs. No focal airspace consolidation, pleural effusion, or pneumothorax. No acute bony findings. IMPRESSION: No active cardiopulmonary disease. Electronically Signed   By: Nicholas  Plundo D.O.   On: 02/25/2021 21:42   CT HEAD WO CONTRAST (5MM)  Result Date:  02/25/2021 CLINICAL DATA:  Head trauma, minor (Age >= 65y) EXAM: CT HEAD WITHOUT CONTRAST TECHNIQUE: Contiguous axial images were obtained from the base of the skull through the vertex without intravenous contrast. COMPARISON:  12/04/2006 FINDINGS: Brain: No evidence of acute infarction, hemorrhage, hydrocephalus, extra-axial collection or mass lesion/mass effect. Unchanged mineralization noted within the basal ganglia bilaterally. Scattered low-density changes within the periventricular and subcortical white matter compatible with chronic microvascular ischemic change. Mild diffuse cerebral volume loss. Vascular: Atherosclerotic calcifications involving the large vessels of the skull base. No unexpected hyperdense vessel. Skull: Normal. Negative for fracture or focal lesion. Sinuses/Orbits: Visualized paranasal sinuses and mastoid air cells are clear. Right globe prosthesis. Other: Negative for scalp hematoma. IMPRESSION: 1. No acute intracranial findings. 2.   Mild chronic microvascular ischemic change and cerebral volume loss. Electronically Signed   By: Nicholas  Plundo D.O.   On: 02/25/2021 21:54   CT Cervical Spine Wo Contrast  Result Date: 02/25/2021 CLINICAL DATA:  Neck trauma (Age >= 65y) EXAM: CT CERVICAL SPINE WITHOUT CONTRAST TECHNIQUE: Multidetector CT imaging of the cervical spine was performed without intravenous contrast. Multiplanar CT image reconstructions were also generated. COMPARISON:  None. FINDINGS: Alignment: Facet joints are aligned without dislocation or traumatic listhesis. Dens and lateral masses are aligned. Straightening of the cervical lordosis with trace retrolisthesis at C5-6. Skull base and vertebrae: No acute fracture. No primary bone lesion or focal pathologic process. Soft tissues and spinal canal: No prevertebral fluid or swelling. No visible canal hematoma. Disc levels: Degenerative disc disease of C5-6. Facet arthropathy is most notable on the right at C7-T1. Upper chest:  Included lung apices are clear. Other: Atherosclerotic calcifications of the visualized aortic arch and branch vessels as well as the bilateral carotid arteries. IMPRESSION: 1. No acute fracture or traumatic listhesis of the cervical spine. 2. Degenerative disc disease of C5-6. Aortic Atherosclerosis (ICD10-I70.0). Electronically Signed   By: Nicholas  Plundo D.O.   On: 02/25/2021 21:57   MR BRAIN WO CONTRAST  Result Date: 02/26/2021 CLINICAL DATA:  75-year-old female status post syncope and fall. Left leg weakness. Altered mental status. EXAM: MRI HEAD WITHOUT CONTRAST TECHNIQUE: Multiplanar, multiecho pulse sequences of the brain and surrounding structures were obtained without intravenous contrast. COMPARISON:  Head CT 02/25/2021. FINDINGS: Brain: No restricted diffusion to suggest acute infarction. No midline shift, mass effect, evidence of mass lesion, ventriculomegaly, extra-axial collection or acute intracranial hemorrhage. Cervicomedullary junction and pituitary are within normal limits. Chronic microhemorrhage in the left parietal lobe with mild DWI susceptibility (series 8, image 18). No cortical encephalomalacia identified. No other chronic cerebral blood products identified. Minimal to mild for age nonspecific mostly periventricular cerebral white matter T2 and FLAIR hyperintensity. Patchy mild to moderate T2 heterogeneity in the pons. Deep gray matter nuclei and cerebellum appear normal for age. Vascular: Major intracranial vascular flow voids are preserved. Tortuous cavernous ICAs. Skull and upper cervical spine: Negative aside from degenerative ligamentous hypertrophy about the odontoid. Normal visible bone marrow signal. Sinuses/Orbits: Postoperative changes to the right globe. Otherwise negative orbits. Paranasal sinuses and mastoids are stable and well aerated. Other: Grossly normal visible internal auditory structures. Negative visible scalp and face. IMPRESSION: 1. No acute intracranial  abnormality. 2. Mild to moderate for age nonspecific signal changes in the brain, including a solitary chronic microhemorrhage in the left parietal lobe. Favor chronic small vessel disease. Electronically Signed   By: H  Hall M.D.   On: 02/26/2021 06:58   US Carotid Bilateral  Result Date: 02/26/2021 CLINICAL DATA:  Syncope and collapse. History of hypertension, hyperlipidemia, diabetes and smoking. EXAM: BILATERAL CAROTID DUPLEX ULTRASOUND TECHNIQUE: Gray scale imaging, color Doppler and duplex ultrasound were performed of bilateral carotid and vertebral arteries in the neck. COMPARISON:  None. FINDINGS: Criteria: Quantification of carotid stenosis is based on velocity parameters that correlate the residual internal carotid diameter with NASCET-based stenosis levels, using the diameter of the distal internal carotid lumen as the denominator for stenosis measurement. The following velocity measurements were obtained: RIGHT ICA: 91/10 cm/sec CCA: 79/7 cm/sec SYSTOLIC ICA/CCA RATIO:  1.2 ECA: 222 cm/sec LEFT ICA: 124/13 cm/sec CCA: 66/6 cm/sec SYSTOLIC ICA/CCA RATIO:  1.9 ECA: 208 cm/sec RIGHT CAROTID ARTERY: Scattered eccentric echogenic plaque throughout the right common carotid artery (images 8, 11 and 12).   There is a large amount of eccentric echogenic plaque within the right carotid bulb (image 15 and 17), extending to involve the origin and proximal aspects of the right internal carotid artery (image 27), morphologically resulting in 50% luminal narrowing though not resulting in elevated peak systolic velocities within the interrogated course the right internal carotid artery. RIGHT VERTEBRAL ARTERY:  Antegrade Flow LEFT CAROTID ARTERY: There is a large amount of eccentric echogenic plaque scattered throughout the left common carotid artery (images 38, 39, 42). There is a large amount of eccentric echogenic plaque within left carotid bulb (image 51 and 53), extending to involve the origin and proximal  aspects of the left internal carotid artery (image 62), morphologically resulting in 50% luminal narrowing and borderline elevated peak systolic velocity within mid aspect the left internal carotid artery. Greatest acquired peak systolic velocity within mid left ICA measures 124 centimeters/second (image 67). LEFT VERTEBRAL ARTERY:  Antegrade flow IMPRESSION: 1. Large amount of left-sided atherosclerotic plaque morphologically results in 50% luminal narrowing and borderline elevated peak systolic velocities within the left internal carotid artery. Further evaluation with CTA could be performed as clinically indicated. 2. Large amount of right-sided atherosclerotic plaque morphologically results in at least 50% luminal narrowing though does not definitely resulting in elevated peak systolic velocities within the interrogated course of the right internal carotid artery. Further evaluation with CTA could be performed as indicated. 3. Antegrade flow demonstrated within the bilateral vertebral arteries. Electronically Signed   By: Sandi Mariscal M.D.   On: 02/26/2021 11:38   US Venous Img Upper Uni Right(DVT)  Result Date: 02/28/2021 CLINICAL DATA:  Edema near IV site, diabetes, history of tobacco abuse EXAM: RIGHT UPPER EXTREMITY VENOUS DOPPLER ULTRASOUND TECHNIQUE: Gray-scale sonography with graded compression, as well as color Doppler and duplex ultrasound were performed to evaluate the upper extremity deep venous system from the level of the subclavian vein and including the jugular, axillary, basilic, radial, ulnar and upper cephalic vein. Spectral Doppler was utilized to evaluate flow at rest and with distal augmentation maneuvers. COMPARISON:  None. FINDINGS: Contralateral Subclavian Vein: Respiratory phasicity is normal and symmetric with the symptomatic side. No evidence of thrombus. Normal compressibility. Internal Jugular Vein: No evidence of thrombus. Normal compressibility, respiratory phasicity and  response to augmentation. Subclavian Vein: No evidence of thrombus. Normal compressibility, respiratory phasicity and response to augmentation. Axillary Vein: No evidence of thrombus. Normal compressibility, respiratory phasicity and response to augmentation. Cephalic Vein: Segmental thrombosis spanning approximately 3 cm in the distal segment, patent more centrally. Basilic Vein: No evidence of thrombus. Normal compressibility, respiratory phasicity and response to augmentation. Brachial Veins: No evidence of thrombus. Normal compressibility, respiratory phasicity and response to augmentation. Radial Veins: No evidence of thrombus. Normal compressibility, respiratory phasicity and response to augmentation. Ulnar Veins: No evidence of thrombus. Normal compressibility, respiratory phasicity and response to augmentation. Venous Reflux:  None visualized. Other Findings:  None visualized. IMPRESSION: 1. Negative for right upper extremity DVT. 2. Segmental superficial thrombophlebitis involving the distal cephalic vein near the IV site. Electronically Signed   By: Lucrezia Europe M.D.   On: 02/28/2021 12:51   DG Chest Port 1 View  Result Date: 02/26/2021 CLINICAL DATA:  Shortness of breath. EXAM: PORTABLE CHEST 1 VIEW COMPARISON:  February 25, 2021 FINDINGS: Mild, diffusely increased interstitial lung markings are seen. This is mildly increased in severity when compared to the prior study. Very mild areas of atelectasis are noted within the bilateral lung bases. There is no evidence of a  pleural effusion or pneumothorax. Mild to moderate severity cardiac silhouette enlargement is seen. There is marked severity calcification of the thoracic aorta. Degenerative changes are noted throughout the thoracic spine. IMPRESSION: 1. Mild diffuse interstitial lung disease with a mild amount of superimposed interstitial edema. Electronically Signed   By: Virgina Norfolk M.D.   On: 02/26/2021 21:11   VAS [place] ABI WITH/WO TBI  Result  Date: 02/22/2021  LOWER EXTREMITY DOPPLER STUDY Patient Name:  Lisa [name]  Date of Exam:   02/15/2021 Medical Rec #: [id number]       Accession #:    [id number] Date of Birth: 1945/11/01       Patient Gender: F Patient Age:   38 years Exam Location:  [facility] Procedure:      VAS [place] ABI WITH/WO TBI Referring Phys: Corene Cornea DEW --------------------------------------------------------------------------------  Indications: Peripheral artery disease.  Vascular Interventions: 03/27/10 & 08/05/10: Left popliteal, TP trunk & peroneal                         artery PTAs;                         Left toes amputated. Comparison Study: 07/20/2020 Performing Technologist: Concha Norway RVT  Examination Guidelines: A complete evaluation includes at minimum, Doppler waveform signals and systolic blood pressure reading at the level of bilateral brachial, anterior tibial, and posterior tibial arteries, when vessel segments are accessible. Bilateral testing is considered an integral part of a complete examination. Photoelectric Plethysmograph (PPG) waveforms and toe systolic pressure readings are included as required and additional duplex testing as needed. Limited examinations for reoccurring indications may be performed as noted.  ABI Findings: +--------+------------------+-----+----------+--------+ Right   Rt Pressure (mmHg)IndexWaveform  Comment  +--------+------------------+-----+----------+--------+ Brachial172                                       +--------+------------------+-----+----------+--------+ ATA     150                    biphasic  .67      +--------+------------------+-----+----------+--------+ PTA     110               0.64 monophasic         +--------+------------------+-----+----------+--------+ +----+------------------+-----+----------+-------+ LeftLt Pressure (mmHg)IndexWaveform  Comment +----+------------------+-----+----------+-------+ ATA 186                     monophasic1.14    +----+------------------+-----+----------+-------+ PTA 179               1.04 biphasic          +----+------------------+-----+----------+-------+ +-------+-----------+-----------+------------+------------+ ABI/TBIToday's ABIToday's TBIPrevious ABIPrevious TBI +-------+-----------+-----------+------------+------------+ Right  .67        .38        .82         .11          +-------+-----------+-----------+------------+------------+ Left   1.14       Amp        .73         Amp          +-------+-----------+-----------+------------+------------+ Right ABIs appear decreased compared to prior study on 06/2020. Left ABIs appear increased compared to prior study on 06/2020.  Summary: Right: Resting right ankle-brachial index indicates moderate right lower extremity arterial disease. The right toe-brachial index  is abnormal. Left: Resting left ankle-brachial index is within normal range. No evidence of significant left lower extremity arterial disease. Toes amputated.  *See table(s) above for measurements and observations.  Electronically signed by Leotis Pain MD on 02/22/2021 at 8:56:00 AM.    Final    ECHOCARDIOGRAM COMPLETE  Result Date: 02/26/2021    ECHOCARDIOGRAM REPORT   Patient Name:   Lisa [name] Date of Exam: 02/26/2021 Medical Rec #:  [id number]      Height:       64.0 in Accession #:    [id number]     Weight:       137.0 lb Date of Birth:  [birth year]      BSA:          1.666 m Patient Age:    38 years       BP:           156/85 mmHg Patient Gender: F              HR:           66 bpm. Exam Location:  [facility] Procedure: 2D Echo, Cardiac Doppler and Color Doppler Indications:     Syncope R55  History:         Patient has no prior history of Echocardiogram examinations.                  Risk Factors:Diabetes and Hypertension. DVT.  Sonographer:     Sherrie Sport Referring Phys:  [place] Diagnosing Phys: Donnelly Angelica IMPRESSIONS  1. Left ventricular ejection fraction,  by estimation, is 60 to 65%. The left ventricle has normal function. The left ventricle has no regional wall motion abnormalities. There is mild left ventricular hypertrophy. Left ventricular diastolic parameters are consistent with Grade II diastolic dysfunction (pseudonormalization).  2. Right ventricular systolic function was not well visualized. The right ventricular size is not well visualized.  3. Left atrial size was moderately dilated.  4. Right atrial size was mildly dilated.  5. Trivial effusion versus pericardial fat pad.  6. The mitral valve is degenerative. No evidence of mitral valve regurgitation. No evidence of mitral stenosis. Moderate mitral annular calcification.  7. The aortic valve is tricuspid. Aortic valve regurgitation is not visualized. Mild aortic valve sclerosis is present, with no evidence of aortic valve stenosis. FINDINGS  Left Ventricle: Left ventricular ejection fraction, by estimation, is 60 to 65%. The left ventricle has normal function. The left ventricle has no regional wall motion abnormalities. The left ventricular internal cavity size was normal in size. There is  mild left ventricular hypertrophy. Left ventricular diastolic parameters are consistent with Grade II diastolic dysfunction (pseudonormalization). Right Ventricle: The right ventricular size is not well visualized. Right vetricular wall thickness was not well visualized. Right ventricular systolic function was not well visualized. Left Atrium: Left atrial size was moderately dilated. Right Atrium: Right atrial size was mildly dilated. Pericardium: Trivial effusion versus pericardial fat pad. Mitral Valve: The mitral valve is degenerative in appearance. Moderate mitral annular calcification. No evidence of mitral valve regurgitation. No evidence of mitral valve stenosis. MV peak gradient, 6.2 mmHg. The mean mitral valve gradient is 3.0 mmHg. Tricuspid Valve: The tricuspid valve is normal in structure. Tricuspid valve  regurgitation is trivial. Aortic Valve: The aortic valve is tricuspid. Aortic valve regurgitation is not visualized. Mild aortic valve sclerosis is present, with no evidence of aortic valve stenosis. Aortic valve mean gradient measures 3.5 mmHg. Aortic valve peak gradient measures  5.7 mmHg. Aortic valve area, by VTI measures 4.83 cm. Pulmonic Valve: The pulmonic valve was grossly normal. Pulmonic valve regurgitation is not visualized. No evidence of pulmonic stenosis. Aorta: The aortic root is normal in size and structure. Venous: The inferior vena cava was not well visualized. IAS/Shunts: No atrial level shunt detected by color flow Doppler.  LEFT VENTRICLE PLAX 2D LVIDd:         5.10 cm  Diastology LVIDs:         2.60 cm  LV e' medial:    3.15 cm/s LV PW:         1.40 cm  LV E/e' medial:  37.1 LV IVS:        1.20 cm  LV e' lateral:   4.46 cm/s LVOT diam:     2.00 cm  LV E/e' lateral: 26.2 LV SV:         117 LV SV Index:   70 LVOT Area:     3.14 cm  RIGHT VENTRICLE RV S prime:     11.70 cm/s LEFT ATRIUM             Index       RIGHT ATRIUM           Index LA diam:        4.50 cm 2.70 cm/m  RA Area:     19.40 cm LA Vol (A2C):   93.9 ml 56.37 ml/m RA Volume:   54.60 ml  32.78 ml/m LA Vol (A4C):   64.6 ml 38.78 ml/m LA Biplane Vol: 78.1 ml 46.88 ml/m  AORTIC VALVE                    PULMONIC VALVE AV Area (Vmax):    4.36 cm     PV Vmax:        0.85 m/s AV Area (Vmean):   3.96 cm     PV Peak grad:   2.9 mmHg AV Area (VTI):     4.83 cm     RVOT Peak grad: 4 mmHg AV Vmax:           119.00 cm/s AV Vmean:          85.600 cm/s AV VTI:            0.242 m AV Peak Grad:      5.7 mmHg AV Mean Grad:      3.5 mmHg LVOT Vmax:         165.00 cm/s LVOT Vmean:        108.000 cm/s LVOT VTI:          0.373 m LVOT/AV VTI ratio: 1.54  AORTA Ao Root diam: 2.70 cm MITRAL VALVE                TRICUSPID VALVE MV Area (PHT): 2.87 cm     TR Peak grad:   9.4 mmHg MV Area VTI:   3.17 cm     TR Vmax:        153.00 cm/s MV Peak grad:   6.2 mmHg MV Mean grad:  3.0 mmHg     SHUNTS MV Vmax:       1.25 m/s     Systemic VTI:  0.37 m MV Vmean:      72.2 cm/s    Systemic Diam: 2.00 cm MV Decel Time: 264 msec MV E velocity: 117.00 cm/s MV A velocity: 113.00 cm/s MV E/A ratio:  1.04 Donnelly Angelica Electronically signed by  Donnelly Angelica Signature Date/Time: 02/26/2021/5:11:58 PM    Final       Subjective: Patient seen and examined on the day of discharge.  Stable no distress.  Stable for discharge home.  Discharge Exam: Vitals:   03/03/21 0444 03/03/21 0737  BP: (!) 152/57 (!) 148/59  Pulse: 67 69  Resp: 15 18  Temp: 98.6 F (37 C) 98.4 F (36.9 C)  SpO2: 93% 94%   Vitals:   03/02/21 1939 03/02/21 2347 03/03/21 0444 03/03/21 0737  BP: 140/60 (!) 172/111 (!) 152/57 (!) 148/59  Pulse: 71 71 67 69  Resp: _0 Temp: 98.7 F (37.1 C) 98.2 F (36.8 C) 98.6 F (37 C) 98.4 F (36.9 C)  TempSrc: Oral Oral Oral Oral  SpO2: 100% 100% 93% 94%  Weight:      Height:        General: Pt is alert, awake, not in acute distress Cardiovascular: RRR, S1/S2 +, no rubs, no gallops Respiratory: CTA bilaterally, no wheezing, no rhonchi Abdominal: Soft, NT, ND, bowel sounds + Extremities: no edema, no cyanosis    The results of significant diagnostics from this hospitalization (including imaging, microbiology, ancillary and laboratory) are listed below for reference.     Microbiology: Recent Results (from the past 240 hour(s))  Resp Panel by RT-PCR (Flu A&B, Covid) Nasopharyngeal Swab     Status: None   Collection Time: 02/26/21 12:08 AM   Specimen: Nasopharyngeal Swab; Nasopharyngeal(NP) swabs in vial transport medium  Result Value Ref Range Status   SARS Coronavirus 2 by RT PCR NEGATIVE NEGATIVE Final    Comment: (NOTE) SARS-CoV-2 target nucleic acids are NOT DETECTED.  The SARS-CoV-2 RNA is generally detectable in upper respiratory specimens during the acute phase of infection. The lowest concentration of SARS-CoV-2 viral  copies this assay can detect is 138 copies/mL. A negative result does not preclude SARS-Cov-2 infection and should not be used as the sole basis for treatment or other patient management decisions. A negative result may occur with  improper specimen collection/handling, submission of specimen other than nasopharyngeal swab, presence of viral mutation(s) within the areas targeted by this assay, and inadequate number of viral copies(<138 copies/mL). A negative result must be combined with clinical observations, patient history, and epidemiological information. The expected result is Negative.  Fact Sheet for Patients:  EntrepreneurPulse.com.au  Fact Sheet for Healthcare Providers:  IncredibleEmployment.be  This test is no t yet approved or cleared by the [place] FDA and  has been authorized for detection and/or diagnosis of SARS-CoV-2 by FDA under an Emergency Use Authorization (EUA). This EUA will remain  in effect (meaning this test can be used) for the duration of the COVID-19 declaration under Section 564(b)(1) of the Act, 21 U.S.C.section 360bbb-3(b)(1), unless the authorization is terminated  or revoked sooner.       Influenza A by PCR NEGATIVE NEGATIVE Final   Influenza B by PCR NEGATIVE NEGATIVE Final    Comment: (NOTE) The Xpert Xpress SARS-CoV-2/FLU/RSV plus assay is intended as an aid in the diagnosis of influenza from Nasopharyngeal swab specimens and should not be used as a sole basis for treatment. Nasal washings and aspirates are unacceptable for Xpert Xpress SARS-CoV-2/FLU/RSV testing.  Fact Sheet for Patients: EntrepreneurPulse.com.au  Fact Sheet for Healthcare Providers: IncredibleEmployment.be  This test is not yet approved or cleared by the [place] FDA and has been authorized for detection and/or diagnosis of SARS-CoV-2 by FDA under an Emergency Use Authorization (EUA). This  EUA will  remain in effect (meaning this test can be used) for the duration of the COVID-19 declaration under Section 564(b)(1) of the Act, 21 U.S.C. section 360bbb-3(b)(1), unless the authorization is terminated or revoked.  Performed at [facility], [address] [place], [place] [address]   CULTURE, BLOOD (ROUTINE X 2) w Reflex to ID Panel     Status: None   Collection Time: 02/26/21 12:08 AM   Specimen: BLOOD  Result Value Ref Range Status   Specimen Description BLOOD RIGHT ASSIST CONTROL  Final   Special Requests   Final    BOTTLES DRAWN AEROBIC AND ANAEROBIC Blood Culture adequate volume   Culture   Final    NO GROWTH 5 DAYS Performed at [facility], [address] [place], [place] [address]    Report Status 03/03/2021 FINAL  Final  CULTURE, BLOOD (ROUTINE X 2) w Reflex to ID Panel     Status: None   Collection Time: 02/26/21 12:27 AM   Specimen: BLOOD  Result Value Ref Range Status   Specimen Description BLOOD LEFT ASSIST CONTROL  Final   Special Requests   Final    BOTTLES DRAWN AEROBIC AND ANAEROBIC Blood Culture results may not be optimal due to an inadequate volume of blood received in culture bottles   Culture   Final    NO GROWTH 5 DAYS Performed at [facility], [address] [place], [place] [address]    Report Status 03/03/2021 FINAL  Final     Labs: BNP (last 3 results) Recent Labs    02/27/21 0252  BNP 468.0*   Basic Metabolic Panel: Recent Labs  Lab 02/27/21 0252 02/28/21 0731 03/01/21 0638 03/02/21 0546 03/03/21 0447  NA 141 138 137 137 140  K 2.8* 3.4* 3.8 3.8 3.9  CL 95* 101 101 103 109  CO2 34* _0 GLUCOSE 155* 159* 144* 153* 109*  BUN _1 CREATININE 0.83 0.61 0.54 0.63 0.68  CALCIUM 8.0* 8.0* 8.5* 8.5* 8.2*  MG 2.2 1.8 1.7 1.6* 1.6*  PHOS 2.1* 2.1* 2.7 3.7 3.9   Liver Function Tests: Recent Labs  Lab 02/25/21 2238 02/26/21 0517  AST 35 31  ALT 18 17  ALKPHOS 59 51   BILITOT 1.1 1.1  PROT 6.3* 5.3*  ALBUMIN 3.1* 2.6*   No results for input(s): LIPASE, AMYLASE in the last 168 hours. No results for input(s): AMMONIA in the last 168 hours. CBC: Recent Labs  Lab 02/25/21 2238 02/26/21 0517 02/27/21 0252 02/28/21 0731 03/01/21 0638 03/02/21 0546 03/03/21 0447  WBC 11.0*   < > 8.5 6.8 5.9 6.1 6.9  NEUTROABS 9.5*  --   --   --   --   --   --   HGB 10.6*   < > 11.3* 10.0* 10.7* 11.1* 10.6*  HCT 31.1*   < > 33.5* 30.1* 32.4* 33.4* 31.4*  MCV 83.2   < > 85.5 83.1 85.0 85.9 84.4  PLT 246   < > 276 243 255 282 272   < > = values in this interval not displayed.   Cardiac Enzymes: No results for input(s): CKTOTAL, CKMB, CKMBINDEX, TROPONINI in the last 168 hours. BNP: Invalid input(s): POCBNP CBG: Recent Labs  Lab 02/27/21 1504  GLUCAP 207*   D-Dimer No results for input(s): DDIMER in the last 72 hours. Hgb A1c No results for input(s): HGBA1C in the last 72 hours. Lipid Profile No results for input(s): CHOL, HDL, LDLCALC,  TRIG, CHOLHDL, LDLDIRECT in the last 72 hours. Thyroid function studies No results for input(s): TSH, T4TOTAL, T3FREE, THYROIDAB in the last 72 hours.  Invalid input(s): FREET3 Anemia work up No results for input(s): VITAMINB12, FOLATE, FERRITIN, TIBC, IRON, RETICCTPCT in the last 72 hours. Urinalysis    Component Value Date/Time   COLORURINE AMBER (A) 02/25/2021 2119   APPEARANCEUR HAZY (A) 02/25/2021 2119   APPEARANCEUR Clear 04/07/2018 1319   LABSPEC 1.017 02/25/2021 2119   PHURINE 5.0 02/25/2021 2119   GLUCOSEU 50 (A) 02/25/2021 2119   HGBUR NEGATIVE 02/25/2021 2119   BILIRUBINUR NEGATIVE 02/25/2021 2119   BILIRUBINUR Negative 09/24/2020 1615   BILIRUBINUR Negative 04/07/2018 1319   KETONESUR 5 (A) 02/25/2021 2119   PROTEINUR 30 (A) 02/25/2021 2119   UROBILINOGEN 0.2 09/24/2020 1615   NITRITE NEGATIVE 02/25/2021 2119   LEUKOCYTESUR TRACE (A) 02/25/2021 2119   Sepsis Labs Invalid input(s): PROCALCITONIN,   WBC,  LACTICIDVEN Microbiology Recent Results (from the past 240 hour(s))  Resp Panel by RT-PCR (Flu A&B, Covid) Nasopharyngeal Swab     Status: None   Collection Time: 02/26/21 12:08 AM   Specimen: Nasopharyngeal Swab; Nasopharyngeal(NP) swabs in vial transport medium  Result Value Ref Range Status   SARS Coronavirus 2 by RT PCR NEGATIVE NEGATIVE Final    Comment: (NOTE) SARS-CoV-2 target nucleic acids are NOT DETECTED.  The SARS-CoV-2 RNA is generally detectable in upper respiratory specimens during the acute phase of infection. The lowest concentration of SARS-CoV-2 viral copies this assay can detect is 138 copies/mL. A negative result does not preclude SARS-Cov-2 infection and should not be used as the sole basis for treatment or other patient management decisions. A negative result may occur with  improper specimen collection/handling, submission of specimen other than nasopharyngeal swab, presence of viral mutation(s) within the areas targeted by this assay, and inadequate number of viral copies(<138 copies/mL). A negative result must be combined with clinical observations, patient history, and epidemiological information. The expected result is Negative.  Fact Sheet for Patients:  EntrepreneurPulse.com.au  Fact Sheet for Healthcare Providers:  IncredibleEmployment.be  This test is no t yet approved or cleared by the [place] FDA and  has been authorized for detection and/or diagnosis of SARS-CoV-2 by FDA under an Emergency Use Authorization (EUA). This EUA will remain  in effect (meaning this test can be used) for the duration of the COVID-19 declaration under Section 564(b)(1) of the Act, 21 U.S.C.section 360bbb-3(b)(1), unless the authorization is terminated  or revoked sooner.       Influenza A by PCR NEGATIVE NEGATIVE Final   Influenza B by PCR NEGATIVE NEGATIVE Final    Comment: (NOTE) The Xpert Xpress SARS-CoV-2/FLU/RSV  plus assay is intended as an aid in the diagnosis of influenza from Nasopharyngeal swab specimens and should not be used as a sole basis for treatment. Nasal washings and aspirates are unacceptable for Xpert Xpress SARS-CoV-2/FLU/RSV testing.  Fact Sheet for Patients: EntrepreneurPulse.com.au  Fact Sheet for Healthcare Providers: IncredibleEmployment.be  This test is not yet approved or cleared by the [place] FDA and has been authorized for detection and/or diagnosis of SARS-CoV-2 by FDA under an Emergency Use Authorization (EUA). This EUA will remain in effect (meaning this test can be used) for the duration of the COVID-19 declaration under Section 564(b)(1) of the Act, 21 U.S.C. section 360bbb-3(b)(1), unless the authorization is terminated or revoked.  Performed at [facility], [address], [place], [place] [address]   CULTURE, BLOOD (ROUTINE X 2) w  Reflex to ID Panel     Status: None   Collection Time: 02/26/21 12:08 AM   Specimen: BLOOD  Result Value Ref Range Status   Specimen Description BLOOD RIGHT ASSIST CONTROL  Final   Special Requests   Final    BOTTLES DRAWN AEROBIC AND ANAEROBIC Blood Culture adequate volume   Culture   Final    NO GROWTH 5 DAYS Performed at [facility], [address] [place], [place]  [address]    Report Status 03/03/2021 FINAL  Final  CULTURE, BLOOD (ROUTINE X 2) w Reflex to ID Panel     Status: None   Collection Time: 02/26/21 12:27 AM   Specimen: BLOOD  Result Value Ref Range Status   Specimen Description BLOOD LEFT ASSIST CONTROL  Final   Special Requests   Final    BOTTLES DRAWN AEROBIC AND ANAEROBIC Blood Culture results may not be optimal due to an inadequate volume of blood received in culture bottles   Culture   Final    NO GROWTH 5 DAYS Performed at [facility], [address], [place], [place] [address]    Report Status 03/03/2021 FINAL  Final      Time coordinating discharge: Over 30 minutes  SIGNED:   Sidney Ace, MD  [facility] 03/03/2021, 2:22 PM Pager   If 7PM-7AM, please contact night-coverage

## 2021-03-04 ENCOUNTER — Telehealth: Payer: Self-pay | Admitting: Family Medicine

## 2021-03-04 ENCOUNTER — Telehealth: Payer: Self-pay

## 2021-03-04 DIAGNOSIS — Z7984 Long term (current) use of oral hypoglycemic drugs: Secondary | ICD-10-CM | POA: Diagnosis not present

## 2021-03-04 DIAGNOSIS — R32 Unspecified urinary incontinence: Secondary | ICD-10-CM | POA: Diagnosis not present

## 2021-03-04 DIAGNOSIS — E538 Deficiency of other specified B group vitamins: Secondary | ICD-10-CM | POA: Diagnosis not present

## 2021-03-04 DIAGNOSIS — H409 Unspecified glaucoma: Secondary | ICD-10-CM | POA: Diagnosis not present

## 2021-03-04 DIAGNOSIS — Z86718 Personal history of other venous thrombosis and embolism: Secondary | ICD-10-CM | POA: Diagnosis not present

## 2021-03-04 DIAGNOSIS — H5461 Unqualified visual loss, right eye, normal vision left eye: Secondary | ICD-10-CM | POA: Diagnosis not present

## 2021-03-04 DIAGNOSIS — F32A Depression, unspecified: Secondary | ICD-10-CM | POA: Diagnosis not present

## 2021-03-04 DIAGNOSIS — W19XXXD Unspecified fall, subsequent encounter: Secondary | ICD-10-CM | POA: Diagnosis not present

## 2021-03-04 DIAGNOSIS — I10 Essential (primary) hypertension: Secondary | ICD-10-CM | POA: Diagnosis not present

## 2021-03-04 DIAGNOSIS — E1151 Type 2 diabetes mellitus with diabetic peripheral angiopathy without gangrene: Secondary | ICD-10-CM | POA: Diagnosis not present

## 2021-03-04 DIAGNOSIS — E785 Hyperlipidemia, unspecified: Secondary | ICD-10-CM | POA: Diagnosis not present

## 2021-03-04 DIAGNOSIS — F17210 Nicotine dependence, cigarettes, uncomplicated: Secondary | ICD-10-CM | POA: Diagnosis not present

## 2021-03-04 DIAGNOSIS — Z89422 Acquired absence of other left toe(s): Secondary | ICD-10-CM | POA: Diagnosis not present

## 2021-03-04 DIAGNOSIS — N179 Acute kidney failure, unspecified: Secondary | ICD-10-CM | POA: Diagnosis not present

## 2021-03-04 DIAGNOSIS — Z7902 Long term (current) use of antithrombotics/antiplatelets: Secondary | ICD-10-CM | POA: Diagnosis not present

## 2021-03-04 DIAGNOSIS — R21 Rash and other nonspecific skin eruption: Secondary | ICD-10-CM | POA: Diagnosis not present

## 2021-03-04 DIAGNOSIS — Z89412 Acquired absence of left great toe: Secondary | ICD-10-CM | POA: Diagnosis not present

## 2021-03-04 DIAGNOSIS — Z9181 History of falling: Secondary | ICD-10-CM | POA: Diagnosis not present

## 2021-03-04 NOTE — Telephone Encounter (Signed)
Home Health Verbal Orders - Caller/Agency: Elease Hashimoto [facility]  Callback Number: [phone]  Requesting OT/PT/Skilled Nursing/Social Work/Speech Therapy: Nursing(acute kidney failure) & Medical Social Work  Frequency: 1w2, 1 every other w 7

## 2021-03-04 NOTE — Telephone Encounter (Signed)
Transition Care Management Follow-up Telephone Call Date of discharge and from where: 03/03/21 [facility] How have you been since you were released from the hospital? Pt states she is doing okay Any questions or concerns? Yes; pt concerned about potassium level; advised we attempted to contact regarding lab results prior to hospital admission.   Items Reviewed: Did the pt receive and understand the discharge instructions provided? Yes  Medications obtained and verified? Yes  Other? No  Any new allergies since your discharge? No  Dietary orders reviewed? Yes Do you have support at home? Yes   Home Care and Equipment/Supplies: Were home health services ordered? yes If so, what is the name of the agency? [facility]  Has the agency set up a time to come to the patient's home? yes Were any new equipment or medical supplies ordered?  Yes: bath bench What is the name of the medical supply agency? [facility] Were you able to get the supplies/equipment? yes Do you have any questions related to the use of the equipment or supplies? No  Functional Questionnaire: (I = Independent and D = Dependent) ADLs: I with assistance  Bathing/Dressing- D  Meal Prep- I  Eating- I  Maintaining continence- I  Transferring/Ambulation- I   Managing Meds- I  Follow up appointments reviewed:  PCP Hospital f/u appt confirmed? No  PCP schedule full; please advise for appt. Specialist Hospital f/u appt confirmed? Yes  Scheduled to see Dr. Maximino Greenland on 03/16/21 for colonoscopy Are transportation arrangements needed? No  If their condition worsens, is the pt aware to call PCP or go to the Emergency Dept.? Yes Was the patient provided with contact information for the PCP's office or ED? Yes Was to pt encouraged to call back with questions or concerns? Yes

## 2021-03-05 NOTE — Telephone Encounter (Signed)
Lvm for pt to call and schedule an appt  

## 2021-03-05 NOTE — Telephone Encounter (Signed)
Verbal orders given  

## 2021-03-06 ENCOUNTER — Telehealth: Payer: Self-pay | Admitting: Family Medicine

## 2021-03-06 DIAGNOSIS — E785 Hyperlipidemia, unspecified: Secondary | ICD-10-CM | POA: Diagnosis not present

## 2021-03-06 DIAGNOSIS — H5461 Unqualified visual loss, right eye, normal vision left eye: Secondary | ICD-10-CM | POA: Diagnosis not present

## 2021-03-06 DIAGNOSIS — I10 Essential (primary) hypertension: Secondary | ICD-10-CM | POA: Diagnosis not present

## 2021-03-06 DIAGNOSIS — H409 Unspecified glaucoma: Secondary | ICD-10-CM | POA: Diagnosis not present

## 2021-03-06 DIAGNOSIS — E1151 Type 2 diabetes mellitus with diabetic peripheral angiopathy without gangrene: Secondary | ICD-10-CM | POA: Diagnosis not present

## 2021-03-06 NOTE — Telephone Encounter (Signed)
Home Health Verbal Orders - Caller/Agency: [facility]  Callback Number: [phone]  Requesting OT/PT/Skilled Nursing/Social Work/Speech Therapy: PT  Frequency: 1w1, 2w4, 1w4

## 2021-03-07 NOTE — Telephone Encounter (Signed)
Is that ok for verbal orders

## 2021-03-08 ENCOUNTER — Telehealth: Payer: Self-pay

## 2021-03-08 NOTE — Telephone Encounter (Signed)
Pt. Calling to reschedule her colonoscopy

## 2021-03-11 ENCOUNTER — Telehealth: Payer: Self-pay

## 2021-03-11 DIAGNOSIS — H409 Unspecified glaucoma: Secondary | ICD-10-CM | POA: Diagnosis not present

## 2021-03-11 DIAGNOSIS — H5461 Unqualified visual loss, right eye, normal vision left eye: Secondary | ICD-10-CM | POA: Diagnosis not present

## 2021-03-11 DIAGNOSIS — E785 Hyperlipidemia, unspecified: Secondary | ICD-10-CM | POA: Diagnosis not present

## 2021-03-11 DIAGNOSIS — E1151 Type 2 diabetes mellitus with diabetic peripheral angiopathy without gangrene: Secondary | ICD-10-CM | POA: Diagnosis not present

## 2021-03-11 DIAGNOSIS — I10 Essential (primary) hypertension: Secondary | ICD-10-CM | POA: Diagnosis not present

## 2021-03-11 NOTE — Telephone Encounter (Signed)
Pt. Calling to cancel colonoscopy 

## 2021-03-11 NOTE — Telephone Encounter (Signed)
Copied from CRM [id number]. Topic: General - Other >> Mar 11, 2021  8:08 AM Jaquita Rector A wrote: Reason for CRM: Darral Dash OT with [facility] called in to inform Dr Carlynn Purl of a missed visit with patient say that she called and set time with patient but when she got to the house patient car was in the driveway she knocked no answer called no answer  so does not think that patient want the services. Please advise  Ph# [phone]

## 2021-03-11 NOTE — Telephone Encounter (Signed)
Procedure has been scheduled for 04/12/21.

## 2021-03-11 NOTE — Telephone Encounter (Signed)
Patient requested to move procedure to 04/12/21. Updated instructions will be mailed.

## 2021-03-11 NOTE — Telephone Encounter (Signed)
Lynn notified

## 2021-03-12 ENCOUNTER — Inpatient Hospital Stay: Payer: 59 | Admitting: Family Medicine

## 2021-03-12 NOTE — Progress Notes (Deleted)
    SUBJECTIVE:   CHIEF COMPLAINT / HPI:   HOSPITAL FOLLOW UP Hospital/facility:  Diagnosis:  Procedures/tests:  Consultants:  New medications:  Discharge instructions:   Status: {Blank multiple:19196::"better","worse","stable","fluctuating"}   OBJECTIVE:   There were no vitals taken for this visit.  ***  ASSESSMENT/PLAN:   No problem-specific Assessment & Plan notes found for this encounter.     Caro Laroche, DO [facility]

## 2021-03-14 DIAGNOSIS — E1151 Type 2 diabetes mellitus with diabetic peripheral angiopathy without gangrene: Secondary | ICD-10-CM | POA: Diagnosis not present

## 2021-03-14 DIAGNOSIS — I10 Essential (primary) hypertension: Secondary | ICD-10-CM | POA: Diagnosis not present

## 2021-03-14 DIAGNOSIS — H5461 Unqualified visual loss, right eye, normal vision left eye: Secondary | ICD-10-CM | POA: Diagnosis not present

## 2021-03-14 DIAGNOSIS — H409 Unspecified glaucoma: Secondary | ICD-10-CM | POA: Diagnosis not present

## 2021-03-14 DIAGNOSIS — E785 Hyperlipidemia, unspecified: Secondary | ICD-10-CM | POA: Diagnosis not present

## 2021-03-15 DIAGNOSIS — H409 Unspecified glaucoma: Secondary | ICD-10-CM | POA: Diagnosis not present

## 2021-03-15 DIAGNOSIS — I10 Essential (primary) hypertension: Secondary | ICD-10-CM | POA: Diagnosis not present

## 2021-03-15 DIAGNOSIS — H5461 Unqualified visual loss, right eye, normal vision left eye: Secondary | ICD-10-CM | POA: Diagnosis not present

## 2021-03-15 DIAGNOSIS — E1151 Type 2 diabetes mellitus with diabetic peripheral angiopathy without gangrene: Secondary | ICD-10-CM | POA: Diagnosis not present

## 2021-03-15 DIAGNOSIS — E785 Hyperlipidemia, unspecified: Secondary | ICD-10-CM | POA: Diagnosis not present

## 2021-03-19 ENCOUNTER — Other Ambulatory Visit: Payer: Self-pay

## 2021-03-19 ENCOUNTER — Ambulatory Visit (INDEPENDENT_AMBULATORY_CARE_PROVIDER_SITE_OTHER): Payer: Medicare Other | Admitting: Family Medicine

## 2021-03-19 ENCOUNTER — Encounter: Payer: Self-pay | Admitting: Family Medicine

## 2021-03-19 VITALS — BP 178/74 | HR 60 | Temp 97.7°F | Resp 16 | Ht 64.0 in | Wt 140.5 lb

## 2021-03-19 DIAGNOSIS — D649 Anemia, unspecified: Secondary | ICD-10-CM

## 2021-03-19 DIAGNOSIS — N3944 Nocturnal enuresis: Secondary | ICD-10-CM | POA: Diagnosis not present

## 2021-03-19 DIAGNOSIS — I10 Essential (primary) hypertension: Secondary | ICD-10-CM | POA: Diagnosis not present

## 2021-03-19 DIAGNOSIS — E876 Hypokalemia: Secondary | ICD-10-CM | POA: Diagnosis not present

## 2021-03-19 DIAGNOSIS — Z5941 Food insecurity: Secondary | ICD-10-CM | POA: Diagnosis not present

## 2021-03-19 DIAGNOSIS — E538 Deficiency of other specified B group vitamins: Secondary | ICD-10-CM

## 2021-03-19 MED ORDER — B-12 1000 MCG SL SUBL: 1.0000 | SUBLINGUAL_TABLET | Freq: Every day | SUBLINGUAL | 5 refills | Status: DC

## 2021-03-19 MED ORDER — BLOOD GLUCOSE METER KIT: PACK | 0 refills | Status: DC

## 2021-03-19 MED ORDER — AMLODIPINE BESYLATE 2.5 MG PO TABS: 2.5000 mg | ORAL_TABLET | Freq: Every day | ORAL | 0 refills | Status: DC

## 2021-03-19 MED ORDER — METAMUCIL 0.52 G PO CAPS: 0.5200 g | ORAL_CAPSULE | Freq: Every day | ORAL | 3 refills | Status: DC

## 2021-03-19 NOTE — Patient Instructions (Addendum)
It was great to see you!  Our plans for today:  - keep taking the amlodipine as you have been doing.  - Keep an eye on your blood pressure, if you see the top number greater than 150 consistently, let us know. Write down your blood pressure measurements and bring these with you when you come.  - Come back in 3 months.  We are checking some labs today, we will release these results to your MyChart.  Take care and seek immediate care sooner if you develop any concerns.   Dr. Linwood Dibbles

## 2021-03-19 NOTE — Progress Notes (Signed)
   SUBJECTIVE:   CHIEF COMPLAINT / HPI:   HOSPITAL FOLLOW UP Hospital/facility: [facility] 10/3-10/9 Diagnosis: Fall 2/2 weakness, difficulties moving R leg. ?syncope. Hypokalemia. AKI. Procedures/tests:  - CXR nl - CT Head NAICA - CT Cspine no fracture, DDD C5-6 - MRI brain NAICA - Carotid US - bilateral narrowing 50% - ECHO G2DD, no WMA, nl EF - ABIs - R mod arterial disease, L nl Consultants: none New medications: none Discharge instructions:   - HH PT/OT Status: better - working with PT on leg strength - not taking hctz since d/c, taking amlodipine 2.5mg . Thinks it has decreased her urination some during the day, has noticed leg swelling more but prefers to stay on amlodipine. - not checking BP at home. Has cuff at home. - hasn't taken BP meds yet today.  - some food insecurity, wants help getting set up with meals on wheels. - needs prescription for bed pads and pullups - still not interested in sleep study (recommended by urology for nocturnal enuresis).  OBJECTIVE:   BP (!) 178/74   Pulse 60   Temp 97.7 F (36.5 C) (Oral)   Resp 16   Ht 5\' 4"  (1.626 m)   Wt 140 lb 8 oz (63.7 kg)   BMI 24.12 kg/m   Gen: well appearing, in NAD Card: Reg rate Lungs: Comfortable WOB on RA Ext: WWP   ASSESSMENT/PLAN:   Benign essential HTN Elevated but hasn't taken meds yet today. Will continue amlodipine instead of HCTZ due to frequent urination. Recommend monitoring BP at home and alert if SBP >150 consistently. F/u in 3 months. Obtaining labs today.  Hypokalemia Recheck labs   Hospital follow up Doing well. Continue to work with PT for strengthening. HTN as above. Hypokalemia as above.   , DO

## 2021-03-19 NOTE — Assessment & Plan Note (Signed)
Elevated but hasn't taken meds yet today. Will continue amlodipine instead of HCTZ due to frequent urination. Recommend monitoring BP at home and alert if SBP >150 consistently. F/u in 3 months. Obtaining labs today.

## 2021-03-19 NOTE — Assessment & Plan Note (Signed)
Recheck labs 

## 2021-03-20 ENCOUNTER — Telehealth: Payer: Self-pay | Admitting: *Deleted

## 2021-03-20 DIAGNOSIS — I10 Essential (primary) hypertension: Secondary | ICD-10-CM | POA: Diagnosis not present

## 2021-03-20 DIAGNOSIS — H409 Unspecified glaucoma: Secondary | ICD-10-CM | POA: Diagnosis not present

## 2021-03-20 DIAGNOSIS — E785 Hyperlipidemia, unspecified: Secondary | ICD-10-CM | POA: Diagnosis not present

## 2021-03-20 DIAGNOSIS — H5461 Unqualified visual loss, right eye, normal vision left eye: Secondary | ICD-10-CM | POA: Diagnosis not present

## 2021-03-20 DIAGNOSIS — E1151 Type 2 diabetes mellitus with diabetic peripheral angiopathy without gangrene: Secondary | ICD-10-CM | POA: Diagnosis not present

## 2021-03-20 LAB — BASIC METABOLIC PANEL
BUN: 15 mg/dL (ref 7–25)
CO2: 26 mmol/L (ref 20–32)
Calcium: 8.6 mg/dL (ref 8.6–10.4)
Chloride: 108 mmol/L (ref 98–110)
Creat: 0.7 mg/dL (ref 0.60–1.00)
Glucose, Bld: 105 mg/dL — ABNORMAL HIGH (ref 65–99)
Potassium: 2.9 mmol/L — ABNORMAL LOW (ref 3.5–5.3)
Sodium: 142 mmol/L (ref 135–146)

## 2021-03-20 LAB — CBC WITH DIFFERENTIAL/PLATELET
Absolute Monocytes: 475 cells/uL (ref 200–950)
Basophils Absolute: 9 cells/uL (ref 0–200)
Basophils Relative: 0.2 %
Eosinophils Absolute: 9 cells/uL — ABNORMAL LOW (ref 15–500)
Eosinophils Relative: 0.2 %
HCT: 31.3 % — ABNORMAL LOW (ref 35.0–45.0)
Hemoglobin: 9.6 g/dL — ABNORMAL LOW (ref 11.7–15.5)
Lymphs Abs: 836 cells/uL — ABNORMAL LOW (ref 850–3900)
MCH: 27.3 pg (ref 27.0–33.0)
MCHC: 30.7 g/dL — ABNORMAL LOW (ref 32.0–36.0)
MCV: 88.9 fL (ref 80.0–100.0)
MPV: 11.8 fL (ref 7.5–12.5)
Monocytes Relative: 10.8 %
Neutro Abs: 3071 cells/uL (ref 1500–7800)
Neutrophils Relative %: 69.8 %
Platelets: 203 10*3/uL (ref 140–400)
RBC: 3.52 10*6/uL — ABNORMAL LOW (ref 3.80–5.10)
RDW: 16 % — ABNORMAL HIGH (ref 11.0–15.0)
Total Lymphocyte: 19 %
WBC: 4.4 10*3/uL (ref 3.8–10.8)

## 2021-03-20 LAB — MAGNESIUM: Magnesium: 1.5 mg/dL (ref 1.5–2.5)

## 2021-03-20 MED ORDER — POTASSIUM CHLORIDE CRYS ER 10 MEQ PO TBCR: 10.0000 meq | EXTENDED_RELEASE_TABLET | Freq: Two times a day (BID) | ORAL | 0 refills | Status: DC

## 2021-03-20 NOTE — Addendum Note (Signed)
Addended by: Caro Laroche on: 03/20/2021 12:49 PM   Modules accepted: Orders

## 2021-03-20 NOTE — Chronic Care Management (AMB) (Signed)
  Chronic Care Management   Note  03/20/2021 Name: Lisa [name] MRN: [id number] DOB: 06-06-1945  Lisa [name] is a 75 y.o. year old female who is a primary care patient of Steele Sizer, MD. I reached out to Geoffery Lyons by phone today in response to a referral sent by Lisa [name]'s PCP.  Lisa [name] was given information about Chronic Care Management services today including:  CCM service includes personalized support from designated clinical staff supervised by her physician, including individualized plan of care and coordination with other care providers 24/7 contact phone numbers for assistance for urgent and routine care needs. Service will only be billed when office clinical staff spend 20 minutes or more in a month to coordinate care. Only one practitioner may furnish and bill the service in a calendar month. The patient may stop CCM services at any time (effective at the end of the month) by phone call to the office staff. The patient is responsible for co-pay (up to 20% after annual deductible is met) if co-pay is required by the individual health plan.   Patient agreed to services and verbal consent obtained.   Follow up plan: Telephone appointment with care management team member scheduled for: 03/21/2021 and 03/25/2021  Julian Hy, [place] Management  Direct Dial: [phone]

## 2021-03-20 NOTE — Chronic Care Management (AMB) (Signed)
  Chronic Care Management   Outreach Note  03/20/2021 Name: Nakya Weyand MRN: [id number] DOB: [birth year]  Janne Faulk is a 75 y.o. year old female who is a primary care patient of Alba Cory, MD. I reached out to [name] by phone today in response to a referral sent by Ms. Naraya Preast's primary care provider.  An unsuccessful telephone outreach was attempted today. The patient was referred to the case management team for assistance with care management and care coordination.   Follow Up Plan: A HIPAA compliant phone message was left for the patient providing contact information and requesting a return call.  The care management team will reach out to the patient again over the next 5 days.  If patient returns call to provider office, please advise to call Embedded Care Management Care Guide Cameo Schmiesing at [phone]  Burman Nieves, CCMA Care Guide, Embedded Care Coordination [facility]  Care Management  Direct Dial: [phone]

## 2021-03-21 ENCOUNTER — Telehealth: Payer: Self-pay

## 2021-03-21 ENCOUNTER — Ambulatory Visit (INDEPENDENT_AMBULATORY_CARE_PROVIDER_SITE_OTHER): Payer: Medicare Other

## 2021-03-21 DIAGNOSIS — Z9181 History of falling: Secondary | ICD-10-CM

## 2021-03-21 DIAGNOSIS — E1159 Type 2 diabetes mellitus with other circulatory complications: Secondary | ICD-10-CM

## 2021-03-21 DIAGNOSIS — S98912D Complete traumatic amputation of left foot, level unspecified, subsequent encounter: Secondary | ICD-10-CM

## 2021-03-21 DIAGNOSIS — E119 Type 2 diabetes mellitus without complications: Secondary | ICD-10-CM

## 2021-03-21 DIAGNOSIS — I152 Hypertension secondary to endocrine disorders: Secondary | ICD-10-CM

## 2021-03-21 DIAGNOSIS — H547 Unspecified visual loss: Secondary | ICD-10-CM

## 2021-03-21 NOTE — Telephone Encounter (Signed)
Copied from CRM [id number]. Topic: General - Other >> Mar 21, 2021 10:41 AM Gaetana Michaelis A wrote: Reason for CRM: The patient has returned a missed call from "Maricella"  Please contact again when possible

## 2021-03-21 NOTE — Patient Instructions (Addendum)
Thank you for allowing the Chronic Care Management team to participate in your care.    Patient Care Plan: RN Care Manager Plan of Care     Problem Identified: HTN,DM, High Fall Risk      Long-Range Goal: Disease Progression Prevented or Minimized   Start Date: 03/21/2021  Expected End Date: 06/19/2021  Priority: High  Note:   Current Barriers:  Chronic Disease Management support and education needs related to HTN, DMII, and High Fall Risk  RNCM Clinical Goal(s):  Patient will demonstrate ongoing adherence to prescribed treatment plan for HTN, DMII, and Fall Risk. Patient will work with [employer] care guide to address needs related to  Financial constraints related to nutrition and utilities through collaboration with Consulting civil engineer, provider, and care team.   Interventions: 1:1 collaboration with primary care provider regarding development and update of comprehensive plan of care as evidenced by provider attestation and co-signature Inter-disciplinary care team collaboration (see longitudinal plan of care) Evaluation of current treatment plan related to  self management and patient's adherence to plan as established by provider  Last practice recorded BP readings:  BP Readings from Last 3 Encounters:  03/19/21 (!) 178/74  03/03/21 (!) 148/59  02/20/21 136/74  Most recent eGFR/CrCl:  Lab Results  Component Value Date   EGFR 72 02/20/2021    No components found for: CRCL   Hypertension Interventions: Reviewed medications and importance of compliance. Advised to continue taking medication as prescribed. Advised to notify her provider if unable to tolerate the prescribed regimen. Advised to update the care management team with concerns regarding medication management or prescription cost. Provided information regarding established blood pressure parameters along with indications for notifying a provider. Encouraged to monitor and record readings.  Discussed compliance  with recommended cardiac prudent diet. Encouraged to read nutrition labels, monitor sodium intake and avoid highly processed foods when possible. Expressed concerns regarding meals and nutritional resources. Referral was placed for outreach with the [facility]. Discussed complications of uncontrolled blood pressure.  Reviewed s/sx of heart attack, stroke and worsening symptoms that require immediate medical attention.   Falls Interventions: Reviewed medications and discussed potential side effects such as dizziness, lightheadedness and frequent urination. Provided information regarding safety and fall prevention. Risk for falls is increased d/t impaired gait and impaired vision. Reports having a cane and walker available to use as needed in the home. Reports currently receiving Home Health services. Advised to follow precautions and activity restrictions as instructed by the physical therapy team. Discussed ability to perform ADL's and tasks in the home. Currently lives alone.  Reports completing ADL's independently. Reports her brother stays occasionally to assist with task in the home and transportation. Advised to update the care management team of changes in functional status.   Lab Results  Component Value Date   HGBA1C 6.2 (A) 02/20/2021   Diabetes Interventions: Assessed patient's understanding of A1c goal: <7% Reviewed medications. Advised to take medications as prescribed. Advised to update the care management team with concerns regarding medication management or prescription cost. Provided information regarding importance of consistent blood glucose monitoring. Reports not monitoring blood glucose consistently d/t glucometer being broken. She will contact her insurance provider to confirmed preferred glucometer to decrease out of pocket cost. Will update/message provider for new order and supplies once preferred glucometer is confirmed.  Reviewed s/sx of hypoglycemia  and hyperglycemia along with recommended interventions. Discussed nutritional intake and importance of complying with a carb modified/diabetic diet. She required assistance with  nutritional resources. Referral submitted for outreach with the [facility]. Discussed and offered referrals for available Diabetes education classes. Declined current need for additional diabetes education. Her A1C is currently at goal of less than 7%. Will continue to outreach to assist with optimal glycemic control. Discussed importance of completing recommended DM preventive care. Reports completing recommended foot care. Toes amputated from left foot. Reports completing regular eye exams d/t glaucoma and loss of vision in left eye. Reports she will contact the [facility] to schedule a follow up r/t blurred vision in right eye.      Patient Goals/Self-Care Activities: Take all  medications as prescribed Attend all scheduled provider appointments Contact pharmacy for medication refills Update the care management team with changes in functional status Contact the clinic for questions and new concerns as needed   Follow Up Plan:   Will follow up next month        Mrs. Rhines verbalized understanding of the information discussed during the telephonic outreach. Declined need for mailed/printed instructions. A member of the care management team will follow up within the next month.  [facility] Care Management [facility] [phone]

## 2021-03-21 NOTE — Telephone Encounter (Signed)
Returned pt call -no answer. 

## 2021-03-21 NOTE — Chronic Care Management (AMB) (Signed)
Chronic Care Management   CCM RN Visit Note  03/21/2021 Name: Lisa [name] MRN: [id number] DOB: 09-15-1945  Subjective: Lisa [name] is a 75 y.o. year old female who is a primary care patient of Steele Sizer, MD. The care management team was consulted for assistance with disease management and care coordination needs.    Engaged with patient by telephone for initial visit in response to provider referral for case management and care coordination services.   Consent to Services:  The patient was given the following information about Chronic Care Management services: 1. CCM service includes personalized support from designated clinical staff supervised by the primary care provider, including individualized plan of care and coordination with other care providers 2. 24/7 contact phone numbers for assistance for urgent and routine care needs. 3. Service will only be billed when office clinical staff spend 20 minutes or more in a month to coordinate care. 4. Only one practitioner may furnish and bill the service in a calendar month. 5.The patient may stop CCM services at any time (effective at the end of the month) by phone call to the office staff. 6. The patient will be responsible for cost sharing (co-pay) of up to 20% of the service fee (after annual deductible is met). Patient agreed to services and consent obtained.  Assessment: Review of patient past medical history, allergies, medications, health status, including review of consultants reports, laboratory and other test data, was performed as part of comprehensive evaluation and provision of chronic care management services.   SDOH (Social Determinants of Health) assessments and interventions performed:   SDOH Interventions    Flowsheet Row Most Recent Value  SDOH Interventions   Food Insecurity Interventions [id number] Referral  Financial Strain Interventions [id number] Referral  Transportation Interventions Other (Comment)  [Brother  is currently assisting/Will notify the team if transportation is required.]        CCM Care Plan  Allergies  Allergen Reactions   Other Itching    Nucinta   Tapentadol Hcl     Outpatient Encounter Medications as of 03/21/2021  Medication Sig Note   amLODipine (NORVASC) 2.5 MG tablet Take 1 tablet (2.5 mg total) by mouth daily.    aspirin 81 MG tablet Take 81 mg by mouth daily.    atropine 1 % ophthalmic solution Place 1 drop into the right eye 3 (three) times daily. 02/26/2021: Last dispensed: 12/31/2020 for 34d supply    brimonidine (ALPHAGAN) 0.2 % ophthalmic solution Place 1 drop into both eyes 2 (two) times daily. 02/26/2021: Last dispensed: 12/31/2020 for 50d supply   clopidogrel (PLAVIX) 75 MG tablet Take 1 tablet (75 mg total) by mouth daily.    Cyanocobalamin (B-12) 1000 MCG SUBL Place 1 tablet under the tongue daily.    dorzolamide-timolol (COSOPT) 22.3-6.8 MG/ML ophthalmic solution SMARTSIG:In Eye(s)    rosuvastatin (CRESTOR) 10 MG tablet Take 1 tablet (10 mg total) by mouth daily.    blood glucose meter kit and supplies Dispense based on patient and insurance preference. Use up to four times daily as directed. (FOR ICD-10 E10.9, E11.9).    Blood Glucose Monitoring Suppl (ACCU-CHEK AVIVA CONNECT) w/Device KIT 1 each by Does not apply route daily. And supplies E11.9 check fsbs bid    citalopram (CELEXA) 20 MG tablet Take 1 tablet (20 mg total) by mouth daily.    JANUVIA 100 MG tablet TAKE 1 TABLET BY MOUTH EVERY DAY    losartan (COZAAR) 100 MG tablet Take 1 tablet (100 mg total) by mouth daily.  Multiple Vitamin (MULTIVITAMIN) tablet Take 1 tablet by mouth daily.    pioglitazone (ACTOS) 15 MG tablet Take 1 tablet (15 mg total) by mouth daily. 02/26/2021: Not yet started   potassium chloride (KLOR-CON) 10 MEQ tablet Take 1 tablet (10 mEq total) by mouth 2 (two) times daily for 5 days.    prednisoLONE acetate (PRED FORTE) 1 % ophthalmic suspension Place into the right eye.     psyllium (METAMUCIL) 0.52 g capsule Take 1 capsule (0.52 g total) by mouth daily.    Vitamin D, Ergocalciferol, (DRISDOL) 1.25 MG (50000 UNIT) CAPS capsule TAKE 1 CAPSULE (50,000 UNITS TOTAL) BY MOUTH EVERY 7 (SEVEN) DAYS.    No facility-administered encounter medications on file as of 03/21/2021.    Patient Active Problem List   Diagnosis Date Noted   AKI (acute kidney injury) ([place]) 02/26/2021   Hypokalemia 02/26/2021   Hypomagnesemia 02/26/2021   Bruising 02/26/2021   Syncope and collapse 02/26/2021   Prolonged QT interval 02/26/2021   Uncontrolled type 2 diabetes with eye complications 24/23/5361   DM (diabetes mellitus), type 2 with peripheral vascular complications ([place]) 44/31/5400   Amputation of left foot ([place]) 12/02/2014   Benign essential HTN 12/02/2014   Depression, major, recurrent, mild ([place]) 12/02/2014   Dyslipidemia 12/02/2014   Vitamin B12 deficiency 12/02/2014   Peripheral neuropathic pain 12/02/2014   Peripheral artery disease ([place]) 12/02/2014   Phantom limb ([place]) 12/02/2014   Diabetes mellitus with nephropathy ([place]) 12/02/2014   Detached retina 12/02/2014   Type 2 diabetes mellitus with peripheral neuropathy ([place]) 12/02/2014   Tobacco use 12/02/2014   Urge incontinence 12/02/2014   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 Patient will work with [employer] care guide to address needs  related to  Financial constraints related to nutrition and utilities through collaboration with Consulting civil engineer, provider, and care team.   Interventions: 1:1 collaboration with primary care provider regarding development and update of comprehensive plan of care as evidenced by provider attestation and co-signature Inter-disciplinary care team collaboration (see longitudinal plan of care) Evaluation of current treatment plan related to  self management and patient's adherence to plan as established by provider  Last practice recorded BP readings:  BP Readings from Last 3 Encounters:  03/19/21 (!) 178/74  03/03/21 (!) 148/59  02/20/21 136/74  Most recent eGFR/CrCl:  Lab Results  Component Value Date   EGFR 72 02/20/2021    No components found for: CRCL   Hypertension Interventions: Reviewed medications and importance of compliance. Advised to continue taking medication as prescribed. Advised to notify her provider if unable to tolerate the prescribed regimen. Advised to update the care management team with concerns regarding medication management or prescription cost. Provided information regarding established blood pressure parameters along with indications for notifying a provider. Encouraged to monitor and record readings.  Discussed compliance with recommended cardiac prudent diet. Encouraged to read nutrition labels, monitor sodium intake and  avoid highly processed foods when possible. Expressed concerns regarding meals and nutritional resources. Referral was placed for outreach with the [facility]. Discussed complications of uncontrolled blood pressure.  Reviewed s/sx of heart attack, stroke and worsening symptoms that require immediate medical attention.   Falls Interventions: Reviewed medications and discussed potential side effects such as dizziness, lightheadedness and frequent urination. Provided information regarding safety and fall prevention. Risk for  falls is increased d/t impaired gait and impaired vision. Reports having a cane and walker available to use as needed in the home. Reports currently receiving Home Health services. Advised to follow precautions and activity restrictions as instructed by the physical therapy team. Discussed ability to perform ADL's and tasks in the home. Currently lives alone.  Reports completing ADL's independently. Reports her brother stays occasionally to assist with task in the home and transportation. Advised to update the care management team of changes in functional status.   Lab Results  Component Value Date   HGBA1C 6.2 (A) 02/20/2021   Diabetes Interventions: Assessed patient's understanding of A1c goal: <7% Reviewed medications. Advised to take medications as prescribed. Advised to update the care management team with concerns regarding medication management or prescription cost. Provided information regarding importance of consistent blood glucose monitoring. Reports not monitoring blood glucose consistently d/t glucometer being broken. She will contact her insurance provider to confirmed preferred glucometer to decrease out of pocket cost. Will update/message provider for new order and supplies once preferred glucometer is confirmed.  Reviewed s/sx of hypoglycemia and hyperglycemia along with recommended interventions. Discussed nutritional intake and importance of complying with a carb modified/diabetic diet. She required assistance with nutritional resources. Referral submitted for outreach with the [facility]. Discussed and offered referrals for available Diabetes education classes. Declined current need for additional diabetes education. Her A1C is currently at goal of less than 7%. Will continue to outreach to assist with optimal glycemic control. Discussed importance of completing recommended DM preventive care. Reports completing recommended foot care. Toes amputated from left  foot. Reports completing regular eye exams d/t glaucoma and loss of vision in left eye. Reports she will contact the [facility] to schedule a follow up r/t blurred vision in right eye.      Patient Goals/Self-Care Activities: Take all  medications as prescribed Attend all scheduled provider appointments Contact pharmacy for medication refills Update the care management team with changes in functional status Contact the clinic for questions and new concerns as needed   Follow Up Plan:   Will follow up next month       PLAN: A member of the care management team will follow up within the next month.  [facility] Care Management [facility] [phone]

## 2021-03-22 ENCOUNTER — Ambulatory Visit: Payer: Self-pay

## 2021-03-22 DIAGNOSIS — E119 Type 2 diabetes mellitus without complications: Secondary | ICD-10-CM

## 2021-03-22 DIAGNOSIS — N3944 Nocturnal enuresis: Secondary | ICD-10-CM

## 2021-03-22 NOTE — Patient Instructions (Addendum)
Thank you for allowing the Chronic Care Management team to participate in your care.  

## 2021-03-22 NOTE — Chronic Care Management (AMB) (Signed)
Chronic Care Management   CCM RN Visit Note  03/22/2021 Name: Lisa [name] MRN: [id number] DOB: [birth year]  Subjective: Lisa [name] is a 75 y.o. year old female who is a primary care patient of Steele Sizer, MD. The care management team was consulted for assistance with disease management and care coordination needs.    Follow-Up Care Coordination/Medical Supplies Engaged with patient by telephone for follow up visit in response to provider referral for case management and care coordination services.   Consent to Services:  The patient was given information about Chronic Care Management services, agreed to services, and gave verbal consent prior to initiation of services.  Please see initial visit note for detailed documentation.   Assessment: Review of patient past medical history, allergies, medications, health status, including review of consultants reports, laboratory and other test data, was performed as part of comprehensive evaluation and provision of chronic care management services.   SDOH (Social Determinants of Health) assessments and interventions performed: No  CCM Care Plan  Allergies  Allergen Reactions   Other Itching    Nucinta   Tapentadol Hcl     Outpatient Encounter Medications as of 03/22/2021  Medication Sig Note   amLODipine (NORVASC) 2.5 MG tablet Take 1 tablet (2.5 mg total) by mouth daily.    aspirin 81 MG tablet Take 81 mg by mouth daily.    atropine 1 % ophthalmic solution Place 1 drop into the right eye 3 (three) times daily. 02/26/2021: Last dispensed: 12/31/2020 for 34d supply    blood glucose meter kit and supplies Dispense based on patient and insurance preference. Use up to four times daily as directed. (FOR ICD-10 E10.9, E11.9).    Blood Glucose Monitoring Suppl (ACCU-CHEK AVIVA CONNECT) w/Device KIT 1 each by Does not apply route daily. And supplies E11.9 check fsbs bid    brimonidine (ALPHAGAN) 0.2 % ophthalmic solution Place 1 drop into  both eyes 2 (two) times daily. 02/26/2021: Last dispensed: 12/31/2020 for 50d supply   citalopram (CELEXA) 20 MG tablet Take 1 tablet (20 mg total) by mouth daily.    clopidogrel (PLAVIX) 75 MG tablet Take 1 tablet (75 mg total) by mouth daily.    Cyanocobalamin (B-12) 1000 MCG SUBL Place 1 tablet under the tongue daily.    dorzolamide-timolol (COSOPT) 22.3-6.8 MG/ML ophthalmic solution SMARTSIG:In Eye(s)    JANUVIA 100 MG tablet TAKE 1 TABLET BY MOUTH EVERY DAY    losartan (COZAAR) 100 MG tablet Take 1 tablet (100 mg total) by mouth daily.    Multiple Vitamin (MULTIVITAMIN) tablet Take 1 tablet by mouth daily.    pioglitazone (ACTOS) 15 MG tablet Take 1 tablet (15 mg total) by mouth daily. 02/26/2021: Not yet started   potassium chloride (KLOR-CON) 10 MEQ tablet Take 1 tablet (10 mEq total) by mouth 2 (two) times daily for 5 days.    prednisoLONE acetate (PRED FORTE) 1 % ophthalmic suspension Place into the right eye.    psyllium (METAMUCIL) 0.52 g capsule Take 1 capsule (0.52 g total) by mouth daily.    rosuvastatin (CRESTOR) 10 MG tablet Take 1 tablet (10 mg total) by mouth daily.    Vitamin D, Ergocalciferol, (DRISDOL) 1.25 MG (50000 UNIT) CAPS capsule TAKE 1 CAPSULE (50,000 UNITS TOTAL) BY MOUTH EVERY 7 (SEVEN) DAYS.    No facility-administered encounter medications on file as of 03/22/2021.    Patient Active Problem List   Diagnosis Date Noted   AKI (acute kidney injury) ([place]) 02/26/2021   Hypokalemia 02/26/2021   Hypomagnesemia  02/26/2021   Bruising 02/26/2021   Syncope and collapse 02/26/2021   Prolonged QT interval 02/26/2021   Uncontrolled type 2 diabetes with eye complications 32/67/1245   DM (diabetes mellitus), type 2 with peripheral vascular complications ([place]) 80/99/8338   Amputation of left foot ([place]) 12/02/2014   Benign essential HTN 12/02/2014   Depression, major, recurrent, mild ([place]) 12/02/2014   Dyslipidemia 12/02/2014   Vitamin B12 deficiency 12/02/2014   Peripheral  neuropathic pain 12/02/2014   Peripheral artery disease ([place]) 12/02/2014   Phantom limb ([place]) 12/02/2014   Diabetes mellitus with nephropathy ([place]) 12/02/2014   Detached retina 12/02/2014   Type 2 diabetes mellitus with peripheral neuropathy ([place]) 12/02/2014   Tobacco use 12/02/2014   Urge incontinence 12/02/2014   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   Vitamin D deficiency 12/02/2014    Conditions to be addressed/monitored: Fall Risk and need for Incontinence supplies Patient Care Plan: RN Care Manager Plan of Care     Problem Identified: HTN,DM, High Fall Risk      Long-Range Goal: Disease Progression Prevented or Minimized   Start Date: 03/21/2021  Expected End Date: 06/19/2021  Priority: High  Note:   Current Barriers:  Chronic Disease Management support and education needs related to HTN, DMII, and High Fall Risk  RNCM Clinical Goal(s):  Patient will demonstrate ongoing adherence to prescribed treatment plan for HTN, DMII, and Fall Risk. Patient will work with [employer] care guide to address needs related to  Financial constraints related to nutrition and utilities through collaboration with Consulting civil engineer, provider, and care team.   Interventions: 1:1 collaboration with primary care provider regarding development and update of comprehensive plan of care as evidenced by provider attestation and co-signature Inter-disciplinary care team collaboration (see longitudinal plan of care) Evaluation of current treatment plan related to  self management and patient's adherence to plan as established by provider  Last practice recorded BP readings:  BP Readings from Last 3 Encounters:  03/19/21 (!) 178/74  03/03/21 (!) 148/59  02/20/21 136/74  Most recent eGFR/CrCl:  Lab Results  Component Value Date   EGFR 72 02/20/2021    No components found for: CRCL   Hypertension Interventions: Reviewed medications and importance of compliance. Advised to continue taking medication as  prescribed. Advised to notify her provider if unable to tolerate the prescribed regimen. Advised to update the care management team with concerns regarding medication management or prescription cost. Provided information regarding established blood pressure parameters along with indications for notifying a provider. Encouraged to monitor and record readings.  Discussed compliance with recommended cardiac prudent diet. Encouraged to read nutrition labels, monitor sodium intake and avoid highly processed foods when possible. Expressed concerns regarding meals and nutritional resources. Referral was placed for outreach with the [facility]. Discussed complications of uncontrolled blood pressure.  Reviewed s/sx of heart attack, stroke and worsening symptoms that require immediate medical attention.   Falls Interventions: Reviewed medications and discussed potential side effects such as dizziness, lightheadedness and frequent urination. Provided information regarding safety and fall prevention. Risk for falls is increased d/t impaired gait and impaired vision. Reports having a cane and walker available to use as needed in the home. Reports currently receiving Home Health services. Advised to follow precautions and activity restrictions as instructed by the physical therapy team. Discussed ability to perform ADL's and tasks in the home. Currently lives alone.  Reports completing ADL's independently. Reports her brother stays occasionally to assist with task in the home and transportation. Advised  to update the care management team of changes in functional status. Update on 03/22/21: Care Coordination related to need for incontinence supplies. Patient recalls recent order being placed for bed pads and adult disposable briefs but unable to confirm supply company. Reviewed chart. Name of supply company is not on file. Reports not having the needed supplies but prefers to wait and allow follow up  contact r/t previous order. Will submit request to [employer] [employer] if she does not receive follow up contact from the previous request.   Lab Results  Component Value Date   HGBA1C 6.2 (A) 02/20/2021   Diabetes Interventions: Assessed patient's understanding of A1c goal: <7% Reviewed medications. Advised to take medications as prescribed. Advised to update the care management team with concerns regarding medication management or prescription cost. Provided information regarding importance of consistent blood glucose monitoring. Reports not monitoring blood glucose consistently d/t glucometer being broken. She will contact her insurance provider to confirmed preferred glucometer to decrease out of pocket cost. Will update/message provider for new order and supplies once preferred glucometer is confirmed.  Reviewed s/sx of hypoglycemia and hyperglycemia along with recommended interventions. Discussed nutritional intake and importance of complying with a carb modified/diabetic diet. She required assistance with nutritional resources. Referral submitted for outreach with the [facility]. Discussed and offered referrals for available Diabetes education classes. Declined current need for additional diabetes education. Her A1C is currently at goal of less than 7%. Will continue to outreach to assist with optimal glycemic control. Discussed importance of completing recommended DM preventive care. Reports completing recommended foot care. Toes amputated from left foot. Reports completing regular eye exams d/t glaucoma and loss of vision in left eye. Reports she will contact the [facility] to schedule a follow up r/t blurred vision in right eye.      Patient Goals/Self-Care Activities: Take all  medications as prescribed Attend all scheduled provider appointments Contact pharmacy for medication refills Update the care management team with changes in functional  status Contact the clinic for questions and new concerns as needed   Follow Up Plan:   Will follow up next month     PLAN: A member of the care management team will follow up within the next month.   [facility] Care Management [facility] [phone]

## 2021-03-25 ENCOUNTER — Ambulatory Visit: Payer: Self-pay | Admitting: *Deleted

## 2021-03-25 ENCOUNTER — Telehealth: Payer: Self-pay | Admitting: *Deleted

## 2021-03-25 ENCOUNTER — Telehealth: Payer: 59 | Admitting: *Deleted

## 2021-03-25 DIAGNOSIS — I152 Hypertension secondary to endocrine disorders: Secondary | ICD-10-CM

## 2021-03-25 DIAGNOSIS — E1159 Type 2 diabetes mellitus with other circulatory complications: Secondary | ICD-10-CM

## 2021-03-25 DIAGNOSIS — E119 Type 2 diabetes mellitus without complications: Secondary | ICD-10-CM | POA: Diagnosis not present

## 2021-03-25 NOTE — Chronic Care Management (AMB) (Signed)
Chronic Care Management    Clinical Social Work Note  03/25/2021 Name: Lisa [name] MRN: [id number] DOB: 06-13-1945  Lisa [name] is a 75 y.o. year old female who is a primary care patient of Steele Sizer, MD. The CCM team was consulted to assist the patient with chronic disease management and/or care coordination needs related to: [employer] .   Engaged with patient by telephone, to re-scheduled missed appointment. Patient requesting assistance with utility bills. Per patient, she has concerns that her utilities will be shut off soon. Patient contacted in response to provider referral for social work chronic care management and care coordination services. Appointment rescheduled for 03/27/21 at 1:00pm. It was recommended that patient contact [employer] for any possible assistance.  Consent to Services:  The patient was given the following information about Chronic Care Management services today, agreed to services, and gave verbal consent: 1. CCM service includes personalized support from designated clinical staff supervised by the primary care provider, including individualized plan of care and coordination with other care providers 2. 24/7 contact phone numbers for assistance for urgent and routine care needs. 3. Service will only be billed when office clinical staff spend 20 minutes or more in a month to coordinate care. 4. Only one practitioner may furnish and bill the service in a calendar month. 5.The patient may stop CCM services at any time (effective at the end of the month) by phone call to the office staff. 6. The patient will be responsible for cost sharing (co-pay) of up to 20% of the service fee (after annual deductible is met). Patient agreed to services and consent obtained.  Patient agreed to services and consent obtained.   Assessment: Review of patient past medical history, allergies, medications, and health status, including review of relevant consultants  reports was performed today as part of a comprehensive evaluation and provision of chronic care management and care coordination services.     SDOH (Social Determinants of Health) assessments and interventions performed:    Advanced Directives Status: Not addressed in this encounter.  CCM Care Plan  Allergies  Allergen Reactions   Other Itching    Nucinta   Tapentadol Hcl     Outpatient Encounter Medications as of 03/25/2021  Medication Sig Note   amLODipine (NORVASC) 2.5 MG tablet Take 1 tablet (2.5 mg total) by mouth daily.    aspirin 81 MG tablet Take 81 mg by mouth daily.    atropine 1 % ophthalmic solution Place 1 drop into the right eye 3 (three) times daily. 02/26/2021: Last dispensed: 12/31/2020 for 34d supply    blood glucose meter kit and supplies Dispense based on patient and insurance preference. Use up to four times daily as directed. (FOR ICD-10 E10.9, E11.9).    Blood Glucose Monitoring Suppl (ACCU-CHEK AVIVA CONNECT) w/Device KIT 1 each by Does not apply route daily. And supplies E11.9 check fsbs bid    brimonidine (ALPHAGAN) 0.2 % ophthalmic solution Place 1 drop into both eyes 2 (two) times daily. 02/26/2021: Last dispensed: 12/31/2020 for 50d supply   citalopram (CELEXA) 20 MG tablet Take 1 tablet (20 mg total) by mouth daily.    clopidogrel (PLAVIX) 75 MG tablet Take 1 tablet (75 mg total) by mouth daily.    Cyanocobalamin (B-12) 1000 MCG SUBL Place 1 tablet under the tongue daily.    dorzolamide-timolol (COSOPT) 22.3-6.8 MG/ML ophthalmic solution SMARTSIG:In Eye(s)    JANUVIA 100 MG tablet TAKE 1 TABLET BY MOUTH EVERY DAY    losartan (COZAAR)  100 MG tablet Take 1 tablet (100 mg total) by mouth daily.    Multiple Vitamin (MULTIVITAMIN) tablet Take 1 tablet by mouth daily.    pioglitazone (ACTOS) 15 MG tablet Take 1 tablet (15 mg total) by mouth daily. 02/26/2021: Not yet started   potassium chloride (KLOR-CON) 10 MEQ tablet Take 1 tablet (10 mEq total) by mouth 2 (two)  times daily for 5 days.    prednisoLONE acetate (PRED FORTE) 1 % ophthalmic suspension Place into the right eye.    psyllium (METAMUCIL) 0.52 g capsule Take 1 capsule (0.52 g total) by mouth daily.    rosuvastatin (CRESTOR) 10 MG tablet Take 1 tablet (10 mg total) by mouth daily.    Vitamin D, Ergocalciferol, (DRISDOL) 1.25 MG (50000 UNIT) CAPS capsule TAKE 1 CAPSULE (50,000 UNITS TOTAL) BY MOUTH EVERY 7 (SEVEN) DAYS.    No facility-administered encounter medications on file as of 03/25/2021.    Patient Active Problem List   Diagnosis Date Noted   AKI (acute kidney injury) ([place]) 02/26/2021   Hypokalemia 02/26/2021   Hypomagnesemia 02/26/2021   Bruising 02/26/2021   Syncope and collapse 02/26/2021   Prolonged QT interval 02/26/2021   Uncontrolled type 2 diabetes with eye complications 83/11/3541   DM (diabetes mellitus), type 2 with peripheral vascular complications ([place]) 01/48/4039   Amputation of left foot ([place]) 12/02/2014   Benign essential HTN 12/02/2014   Depression, major, recurrent, mild ([place]) 12/02/2014   Dyslipidemia 12/02/2014   Vitamin B12 deficiency 12/02/2014   Peripheral neuropathic pain 12/02/2014   Peripheral artery disease ([place]) 12/02/2014   Phantom limb ([place]) 12/02/2014   Diabetes mellitus with nephropathy ([place]) 12/02/2014   Detached retina 12/02/2014   Type 2 diabetes mellitus with peripheral neuropathy ([place]) 12/02/2014   Tobacco use 12/02/2014   Urge incontinence 12/02/2014   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   Vitamin D deficiency 12/02/2014    Conditions to be addressed/monitored: HTN and DMII; Limited social support and Limited access to food  There are no care plans that you recently modified to display for this patient.    Follow Up Plan: SW will follow up with patient by phone over the next 5-7 business days      [place], [place] Worker  [facility] Care Management [phone]

## 2021-03-25 NOTE — Telephone Encounter (Signed)
  Care Management   Follow Up Note   03/25/2021 Name: Lisa [name] MRN: [id number] DOB: 1945-06-23   Referred by: Alba Cory, MD Reason for referral : Care Coordination   An unsuccessful telephone outreach was attempted today. The patient was referred to the case management team for assistance with care management and care coordination.   Follow Up Plan: Telephone follow up appointment with care management team member to be re-scheduled by care guide  Verna Czech, LCSW Clinical Social Worker  [facility] Care Management [phone]

## 2021-03-26 ENCOUNTER — Ambulatory Visit (INDEPENDENT_AMBULATORY_CARE_PROVIDER_SITE_OTHER): Payer: Medicare Other | Admitting: *Deleted

## 2021-03-26 DIAGNOSIS — E1151 Type 2 diabetes mellitus with diabetic peripheral angiopathy without gangrene: Secondary | ICD-10-CM | POA: Diagnosis not present

## 2021-03-26 DIAGNOSIS — E119 Type 2 diabetes mellitus without complications: Secondary | ICD-10-CM

## 2021-03-26 DIAGNOSIS — E1159 Type 2 diabetes mellitus with other circulatory complications: Secondary | ICD-10-CM

## 2021-03-26 DIAGNOSIS — H5461 Unqualified visual loss, right eye, normal vision left eye: Secondary | ICD-10-CM | POA: Diagnosis not present

## 2021-03-26 DIAGNOSIS — I10 Essential (primary) hypertension: Secondary | ICD-10-CM | POA: Diagnosis not present

## 2021-03-26 DIAGNOSIS — E785 Hyperlipidemia, unspecified: Secondary | ICD-10-CM | POA: Diagnosis not present

## 2021-03-26 DIAGNOSIS — I152 Hypertension secondary to endocrine disorders: Secondary | ICD-10-CM

## 2021-03-26 DIAGNOSIS — H409 Unspecified glaucoma: Secondary | ICD-10-CM | POA: Diagnosis not present

## 2021-03-27 ENCOUNTER — Ambulatory Visit: Payer: Self-pay | Admitting: *Deleted

## 2021-03-27 ENCOUNTER — Telehealth: Payer: 59 | Admitting: *Deleted

## 2021-03-27 ENCOUNTER — Other Ambulatory Visit: Payer: Self-pay | Admitting: Family Medicine

## 2021-03-27 ENCOUNTER — Encounter: Payer: Self-pay | Admitting: *Deleted

## 2021-03-27 DIAGNOSIS — E1159 Type 2 diabetes mellitus with other circulatory complications: Secondary | ICD-10-CM

## 2021-03-27 DIAGNOSIS — E1121 Type 2 diabetes mellitus with diabetic nephropathy: Secondary | ICD-10-CM

## 2021-03-27 DIAGNOSIS — E119 Type 2 diabetes mellitus without complications: Secondary | ICD-10-CM

## 2021-03-27 DIAGNOSIS — I152 Hypertension secondary to endocrine disorders: Secondary | ICD-10-CM

## 2021-03-27 DIAGNOSIS — F330 Major depressive disorder, recurrent, mild: Secondary | ICD-10-CM

## 2021-03-27 DIAGNOSIS — I10 Essential (primary) hypertension: Secondary | ICD-10-CM

## 2021-03-27 NOTE — Patient Instructions (Signed)
Visit Information  PATIENT GOALS/PLAN OF CARE:  Care Plan : General Social Work (Adult)  Updates made by Wenda Overland, LCSW since 03/27/2021 12:00 AM     Problem: Social and Functional Symptoms      Problem: CHL AMB "PATIENT-SPECIFIC PROBLEM"   Note:   CARE PLAN ENTRY (see longitudinal plan of care for additional care plan information)  Current Barriers:  Patient with DM in need of assistance with connection to community resources  Knowledge deficits and need for support, education and care coordination related to community resources support  Limited social support and Lack of essential utilities - behind on payments*  Clinical Goal(s)  Over the next 90 days, patient will work with care management team member to address concerns related to identifying community resources for [name]  Interventions provided by LCSW:  Assessed patient's care coordination needs related to stated financial challenges and discussed ongoing care management follow up  Provided patient with information about the [employer], [name] and the Department of Social Services for possible emergency assistance Advised patient to follow up with these agencies for possible assistance Collaborated with appropriate clinical care team members regarding patient needs Patient interviewed and appropriate assessments performed Collaborated with the Department of Social Services (community agency) re: emergency assistance Solution-Focused Strategies employed: related to options for [employer] Active listening / Reflection utilized  Emotional Support Provided related to financial strategies  Patient Self Care Activities & Deficits:  Patient is unable to independently navigate community resource options without care coordination support  Acknowledges deficits and is motivated to resolve concern with encouragement Patient is able to contact [name]  as discussed today Unable to  perform IADLs independently Calls provider office for new concerns or questions  Initial goal documentation       Ms. Cotham was given information about Care Management services by the embedded care coordination team including:  Care Management services include personalized support from designated clinical staff supervised by her physician, including individualized plan of care and coordination with other care providers 24/7 contact phone numbers for assistance for urgent and routine care needs. The patient may stop CCM services at any time (effective at the end of the month) by phone call to the office staff.  Patient agreed to services and verbal consent obtained.   The patient verbalized understanding of instructions, educational materials, and care plan provided today and declined offer to receive copy of patient instructions, educational materials, and care plan.   Telephone follow up appointment with care management team member scheduled for: 03/28/21   .cls

## 2021-03-27 NOTE — Progress Notes (Signed)
This encounter was created in error - please disregard.

## 2021-03-27 NOTE — Chronic Care Management (AMB) (Signed)
Chronic Care Management    Clinical Social Work Note  03/27/2021 Name: Lisa [name] MRN: [id number] DOB: 08-Jan-1946  Clarita Mcelvain is a 75 y.o. year old female who is a primary care patient of Steele Sizer, MD. The CCM team was consulted to assist the patient with chronic disease management and/or care coordination needs related to: [employer] .   Collaboration with Solicitor  for  follow up  in response to provider referral for social work chronic care management and care coordination services.   Consent to Services:  The patient was given information about Chronic Care Management services, agreed to services, and gave verbal consent prior to initiation of services.  Please see initial visit note for detailed documentation.   Patient agreed to services and consent obtained.   Assessment: Review of patient past medical history, allergies, medications, and health status, including review of relevant consultants reports was performed today as part of a comprehensive evaluation and provision of chronic care management and care coordination services.     SDOH (Social Determinants of Health) assessments and interventions performed:    Advanced Directives Status: Not addressed in this encounter.  CCM Care Plan  Allergies  Allergen Reactions   Other Itching    Nucinta   Tapentadol Hcl     Outpatient Encounter Medications as of 03/27/2021  Medication Sig Note   amLODipine (NORVASC) 2.5 MG tablet Take 1 tablet (2.5 mg total) by mouth daily.    aspirin 81 MG tablet Take 81 mg by mouth daily.    atropine 1 % ophthalmic solution Place 1 drop into the right eye 3 (three) times daily. 02/26/2021: Last dispensed: 12/31/2020 for 34d supply    blood glucose meter kit and supplies Dispense based on patient and insurance preference. Use up to four times daily as directed. (FOR ICD-10 E10.9, E11.9).    Blood Glucose Monitoring Suppl (ACCU-CHEK AVIVA CONNECT) w/Device KIT 1 each by  Does not apply route daily. And supplies E11.9 check fsbs bid    brimonidine (ALPHAGAN) 0.2 % ophthalmic solution Place 1 drop into both eyes 2 (two) times daily. 02/26/2021: Last dispensed: 12/31/2020 for 50d supply   citalopram (CELEXA) 20 MG tablet Take 1 tablet (20 mg total) by mouth daily.    clopidogrel (PLAVIX) 75 MG tablet Take 1 tablet (75 mg total) by mouth daily.    Cyanocobalamin (B-12) 1000 MCG SUBL Place 1 tablet under the tongue daily.    dorzolamide-timolol (COSOPT) 22.3-6.8 MG/ML ophthalmic solution SMARTSIG:In Eye(s)    JANUVIA 100 MG tablet TAKE 1 TABLET BY MOUTH EVERY DAY    losartan (COZAAR) 100 MG tablet Take 1 tablet (100 mg total) by mouth daily.    Multiple Vitamin (MULTIVITAMIN) tablet Take 1 tablet by mouth daily.    pioglitazone (ACTOS) 15 MG tablet Take 1 tablet (15 mg total) by mouth daily. 02/26/2021: Not yet started   potassium chloride (KLOR-CON) 10 MEQ tablet Take 1 tablet (10 mEq total) by mouth 2 (two) times daily for 5 days.    prednisoLONE acetate (PRED FORTE) 1 % ophthalmic suspension Place into the right eye.    psyllium (METAMUCIL) 0.52 g capsule Take 1 capsule (0.52 g total) by mouth daily.    rosuvastatin (CRESTOR) 10 MG tablet Take 1 tablet (10 mg total) by mouth daily.    Vitamin D, Ergocalciferol, (DRISDOL) 1.25 MG (50000 UNIT) CAPS capsule TAKE 1 CAPSULE (50,000 UNITS TOTAL) BY MOUTH EVERY 7 (SEVEN) DAYS.    No facility-administered encounter medications on file as  of 03/27/2021.    Patient Active Problem List   Diagnosis Date Noted   AKI (acute kidney injury) ([place]) 02/26/2021   Hypokalemia 02/26/2021   Hypomagnesemia 02/26/2021   Bruising 02/26/2021   Syncope and collapse 02/26/2021   Prolonged QT interval 02/26/2021   Uncontrolled type 2 diabetes with eye complications 62/70/3500   DM (diabetes mellitus), type 2 with peripheral vascular complications ([place]) 93/81/8299   Amputation of left foot ([place]) 12/02/2014   Benign essential HTN 12/02/2014    Depression, major, recurrent, mild ([place]) 12/02/2014   Dyslipidemia 12/02/2014   Vitamin B12 deficiency 12/02/2014   Peripheral neuropathic pain 12/02/2014   Peripheral artery disease ([place]) 12/02/2014   Phantom limb ([place]) 12/02/2014   Diabetes mellitus with nephropathy ([place]) 12/02/2014   Detached retina 12/02/2014   Type 2 diabetes mellitus with peripheral neuropathy ([place]) 12/02/2014   Tobacco use 12/02/2014   Urge incontinence 12/02/2014   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   Vitamin D deficiency 12/02/2014    Conditions to be addressed/monitored: HTN and DMII; Limited social support and Lack of essential utilities - financial challenges, having difficulty paying utility bills*  Care Plan : General Social Work (Adult)  Updates made by [employer], Darla Lesches, LCSW since 03/27/2021 12:00 AM     Problem: Social and Functional Symptoms      Problem: CHL AMB "PATIENT-SPECIFIC PROBLEM"   Note:   CARE PLAN ENTRY (see longitudinal plan of care for additional care plan information)  Current Barriers:  Patient with DM in need of assistance with connection to community resources  Knowledge deficits and need for support, education and care coordination related to community resources support  Limited social support and Lack of essential utilities - behind on payments*  Clinical Goal(s)  Over the next 90 days, patient will work with care management team member to address concerns related to identifying community resources for [employer]  Interventions provided by LCSW:  Assessed patient's care coordination needs related to stated financial challenges and discussed ongoing care management follow up  Provided patient with information about the [employer], [employer] and the Department of Social Services for possible emergency assistance Advised patient to follow up with these agencies for possible assistance Collaborated with appropriate clinical care team members regarding patient  needs Patient interviewed and appropriate assessments performed Collaborated with the Department of Social Services (community agency) re: emergency assistance Solution-Focused Strategies employed: related to options for [employer] Active listening / Reflection utilized  Emotional Support Provided related to financial strategies 03/27/21 Phone call to the [employer] to determine if they had emergency funds to assist with patient's utility bills-funds are available, patient instructed to call on Friday for an appointment-collaboration with a care guide regarding the [place] to determine eligibility limits also discussed. Follow up phone call to discuss options made to patient, Voicemail left for a return call.  Patient Self Care Activities & Deficits:  Patient is unable to independently navigate community resource options without care coordination support  Acknowledges deficits and is motivated to resolve concern with encouragement Patient is able to contact [place]  as discussed today Unable to perform IADLs independently Calls provider office for new concerns or questions  Initial goal documentation        Follow Up Plan: SW will follow up with patient by phone over the next 5-7 business days      Elliot Gurney, Lena Worker  [facility] Care Management [phone]

## 2021-03-27 NOTE — Chronic Care Management (AMB) (Signed)
Chronic Care Management    Clinical Social Work Note  03/27/2021 Name: Lisa [name] MRN: [id number] DOB: [birth year]  Lisa [name] is a 75 y.o. year old female who is a primary care patient of Steele Sizer, MD. The CCM team was consulted to assist the patient with chronic disease management and/or care coordination needs related to: [employer] .   Engaged with patient by telephone for initial visit in response to provider referral for social work chronic care management and care coordination services.   Consent to Services:  The patient was given the following information about Chronic Care Management services today, agreed to services, and gave verbal consent: 1. CCM service includes personalized support from designated clinical staff supervised by the primary care provider, including individualized plan of care and coordination with other care providers 2. 24/7 contact phone numbers for assistance for urgent and routine care needs. 3. Service will only be billed when office clinical staff spend 20 minutes or more in a month to coordinate care. 4. Only one practitioner may furnish and bill the service in a calendar month. 5.The patient may stop CCM services at any time (effective at the end of the month) by phone call to the office staff. 6. The patient will be responsible for cost sharing (co-pay) of up to 20% of the service fee (after annual deductible is met). Patient agreed to services and consent obtained.  Patient agreed to services and consent obtained.   Assessment: Review of patient past medical history, allergies, medications, and health status, including review of relevant consultants reports was performed today as part of a comprehensive evaluation and provision of chronic care management and care coordination services.     SDOH (Social Determinants of Health) assessments and interventions performed:    Advanced Directives Status: Not addressed in this encounter.  CCM  Care Plan  Allergies  Allergen Reactions   Other Itching    Nucinta   Tapentadol Hcl     Outpatient Encounter Medications as of 03/26/2021  Medication Sig Note   amLODipine (NORVASC) 2.5 MG tablet Take 1 tablet (2.5 mg total) by mouth daily.    aspirin 81 MG tablet Take 81 mg by mouth daily.    atropine 1 % ophthalmic solution Place 1 drop into the right eye 3 (three) times daily. 02/26/2021: Last dispensed: 12/31/2020 for 34d supply    blood glucose meter kit and supplies Dispense based on patient and insurance preference. Use up to four times daily as directed. (FOR ICD-10 E10.9, E11.9).    Blood Glucose Monitoring Suppl (ACCU-CHEK AVIVA CONNECT) w/Device KIT 1 each by Does not apply route daily. And supplies E11.9 check fsbs bid    brimonidine (ALPHAGAN) 0.2 % ophthalmic solution Place 1 drop into both eyes 2 (two) times daily. 02/26/2021: Last dispensed: 12/31/2020 for 50d supply   citalopram (CELEXA) 20 MG tablet Take 1 tablet (20 mg total) by mouth daily.    clopidogrel (PLAVIX) 75 MG tablet Take 1 tablet (75 mg total) by mouth daily.    Cyanocobalamin (B-12) 1000 MCG SUBL Place 1 tablet under the tongue daily.    dorzolamide-timolol (COSOPT) 22.3-6.8 MG/ML ophthalmic solution SMARTSIG:In Eye(s)    JANUVIA 100 MG tablet TAKE 1 TABLET BY MOUTH EVERY DAY    losartan (COZAAR) 100 MG tablet Take 1 tablet (100 mg total) by mouth daily.    Multiple Vitamin (MULTIVITAMIN) tablet Take 1 tablet by mouth daily.    pioglitazone (ACTOS) 15 MG tablet Take 1 tablet (15 mg total) by  mouth daily. 02/26/2021: Not yet started   prednisoLONE acetate (PRED FORTE) 1 % ophthalmic suspension Place into the right eye.    psyllium (METAMUCIL) 0.52 g capsule Take 1 capsule (0.52 g total) by mouth daily.    rosuvastatin (CRESTOR) 10 MG tablet Take 1 tablet (10 mg total) by mouth daily.    Vitamin D, Ergocalciferol, (DRISDOL) 1.25 MG (50000 UNIT) CAPS capsule TAKE 1 CAPSULE (50,000 UNITS TOTAL) BY MOUTH EVERY 7  (SEVEN) DAYS.    No facility-administered encounter medications on file as of 03/26/2021.    Patient Active Problem List   Diagnosis Date Noted   AKI (acute kidney injury) ([facility]) 02/26/2021   Hypokalemia 02/26/2021   Hypomagnesemia 02/26/2021   Bruising 02/26/2021   Syncope and collapse 02/26/2021   Prolonged QT interval 02/26/2021   Uncontrolled type 2 diabetes with eye complications 01/29/2016   DM (diabetes mellitus), type 2 with peripheral vascular complications ([facility]) 09/25/2015   Amputation of left foot ([facility]) 12/02/2014   Benign essential HTN 12/02/2014   Depression, major, recurrent, mild (HCC) 12/02/2014   Dyslipidemia 12/02/2014   Vitamin B12 deficiency 12/02/2014   Peripheral neuropathic pain 12/02/2014   Peripheral artery disease (HCC) 12/02/2014   Phantom limb (HCC) 12/02/2014   Diabetes mellitus with nephropathy ([facility]CC) 12/02/2014   Detached retina 12/02/2014   Type 2 diabetes mellitus with peripheral neuropathy ([facility]CC) 12/02/2014   Tobacco use 12/02/2014   Urge incontinence 12/02/2014   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   Vitamin D deficiency 12/02/2014    Conditions to be addressed/monitored: DMII; Limited social support and Lack of essential utilities - challenges with utilities payments*  Care Plan : General Social Work (Adult)  Updates made by Land, Chrystal M, LCSW since 03/27/2021 12:00 AM     Problem: Social and Functional Symptoms      Problem: CHL AMB "PATIENT-SPECIFIC PROBLEM"   Note:   CARE PLAN ENTRY (see longitudinal plan of care for additional care plan information)  Current Barriers:  Patient with DM in need of assistance with connection to community resources  Knowledge deficits and need for support, education and care coordination related to community resources support  Limited social support and Lack of essential utilities - behind on payments*  Clinical Goal(s)  Over the next 90 days, patient will work with care management team member to address  concerns related to identifying community resources for utility bill payments  Interventions provided by LCSW:  Assessed patient's care coordination needs related to stated financial challenges and discussed ongoing care management follow up  Provided patient with information about the [employer], [employer] and the Department of Social Services for possible emergency assistance Advised patient to follow up with these agencies for possible assistance Collaborated with appropriate clinical care team members regarding patient needs Patient interviewed and appropriate assessments performed Collaborated with the Department of Social Services (community agency) re: emergency assistance Solution-Focused Strategies employed: related to options for utility payments Active listening / Reflection utilized  Emotional Support Provided related to financial strategies  Patient Self Care Activities & Deficits:  Patient is unable to independently navigate community resource options without care coordination support  Acknowledges deficits and is motivated to resolve concern with encouragement Patient is able to contact [address] [employer]  as discussed today Unable to perform IADLs independently Calls provider office for new concerns or questions  Initial goal documentation        Follow Up Plan: SW will follow up with patient by phone over the next 14 business   days      Chrystal Land, LCSW Clinical Social Worker  [facility] Care Management [phone]    

## 2021-03-28 ENCOUNTER — Telehealth: Payer: Self-pay

## 2021-03-28 ENCOUNTER — Telehealth: Payer: 59 | Admitting: *Deleted

## 2021-03-28 ENCOUNTER — Ambulatory Visit: Payer: Self-pay | Admitting: *Deleted

## 2021-03-28 ENCOUNTER — Ambulatory Visit: Payer: Self-pay

## 2021-03-28 DIAGNOSIS — E785 Hyperlipidemia, unspecified: Secondary | ICD-10-CM | POA: Diagnosis not present

## 2021-03-28 DIAGNOSIS — E119 Type 2 diabetes mellitus without complications: Secondary | ICD-10-CM

## 2021-03-28 DIAGNOSIS — E1151 Type 2 diabetes mellitus with diabetic peripheral angiopathy without gangrene: Secondary | ICD-10-CM | POA: Diagnosis not present

## 2021-03-28 DIAGNOSIS — H5461 Unqualified visual loss, right eye, normal vision left eye: Secondary | ICD-10-CM | POA: Diagnosis not present

## 2021-03-28 DIAGNOSIS — H409 Unspecified glaucoma: Secondary | ICD-10-CM | POA: Diagnosis not present

## 2021-03-28 DIAGNOSIS — Z9181 History of falling: Secondary | ICD-10-CM

## 2021-03-28 DIAGNOSIS — E1159 Type 2 diabetes mellitus with other circulatory complications: Secondary | ICD-10-CM

## 2021-03-28 DIAGNOSIS — Z789 Other specified health status: Secondary | ICD-10-CM

## 2021-03-28 DIAGNOSIS — I10 Essential (primary) hypertension: Secondary | ICD-10-CM | POA: Diagnosis not present

## 2021-03-28 NOTE — Patient Instructions (Addendum)
Thank you for allowing the Chronic Care Management team to participate in your care.   Patient Care Plan: RN Care Manager Plan of Care     Problem Identified: HTN,DM, High Fall Risk      Long-Range Goal: Disease Progression Prevented or Minimized   Start Date: 03/21/2021  Expected End Date: 06/19/2021  Priority: High  Note:   Current Barriers:  Chronic Disease Management support and education needs related to HTN, DMII, and High Fall Risk  RNCM Clinical Goal(s):  Patient will demonstrate ongoing adherence to prescribed treatment plan for HTN, DMII, and Fall Risk. Patient will work with [employer] care guide to address needs related to  Financial constraints related to nutrition and utilities through collaboration with Consulting civil engineer, provider, and care team.   Interventions: 1:1 collaboration with primary care provider regarding development and update of comprehensive plan of care as evidenced by provider attestation and co-signature Inter-disciplinary care team collaboration (see longitudinal plan of care) Evaluation of current treatment plan related to  self management and patient's adherence to plan as established by provider  Last practice recorded BP readings:  BP Readings from Last 3 Encounters:  03/19/21 (!) 178/74  03/03/21 (!) 148/59  02/20/21 136/74  Most recent eGFR/CrCl:  Lab Results  Component Value Date   EGFR 72 02/20/2021    No components found for: CRCL   Hypertension Interventions: Reviewed medications and importance of compliance. Advised to continue taking medication as prescribed. Advised to notify her provider if unable to tolerate the prescribed regimen. Advised to update the care management team with concerns regarding medication management or prescription cost. Provided information regarding established blood pressure parameters along with indications for notifying a provider. Encouraged to monitor and record readings.  Discussed compliance  with recommended cardiac prudent diet. Encouraged to read nutrition labels, monitor sodium intake and avoid highly processed foods when possible. Expressed concerns regarding meals and nutritional resources. Referral was placed for outreach with the [facility] [place]. Discussed complications of uncontrolled blood pressure.  Reviewed s/sx of heart attack, stroke and worsening symptoms that require immediate medical attention.  Falls Interventions: Reviewed medications and discussed potential side effects such as dizziness, lightheadedness and frequent urination. Provided information regarding safety and fall prevention. Risk for falls is increased d/t impaired gait and impaired vision. Reports having a cane and walker available to use as needed in the home. Reports currently receiving Home Health services. Advised to follow precautions and activity restrictions as instructed by the physical therapy team. Discussed ability to perform ADL's and tasks in the home. Currently lives alone.  Reports completing ADL's independently. Reports her brother stays occasionally to assist with task in the home and transportation. Advised to update the care management team of changes in functional status. Update on 03/28/21: Mrs. Baumert reports not receiving needed incontinence supplies from the previous vendor. Currently requires disposable adult briefs and bed pads. Voiced concerns regarding price of supplies when attempting to purchase in stores. She is agreeable to completing the request with [employer]. Will contact [employer] and forwarded needed documents to her primary provider.    Lab Results  Component Value Date   HGBA1C 6.2 (A) 02/20/2021   Diabetes Interventions: Assessed patient's understanding of A1c goal: <7% Reviewed medications. Advised to take medications as prescribed. Advised to update the care management team with concerns regarding medication management or  prescription cost. Provided information regarding importance of consistent blood glucose monitoring. Reports not monitoring blood glucose consistently d/t glucometer being broken.  She will contact her insurance provider to confirmed preferred glucometer to decrease out of pocket cost. Will update/message provider for new order and supplies once preferred glucometer is confirmed.  Reviewed s/sx of hypoglycemia and hyperglycemia along with recommended interventions. Discussed nutritional intake and importance of complying with a carb modified/diabetic diet. She required assistance with nutritional resources. Referral submitted for outreach with the [facility]. Discussed and offered referrals for available Diabetes education classes. Declined current need for additional diabetes education. Her A1C is currently at goal of less than 7%. Will continue to outreach to assist with optimal glycemic control. Discussed importance of completing recommended DM preventive care. Reports completing recommended foot care. Toes amputated from left foot. Reports completing regular eye exams d/t glaucoma and loss of vision in left eye. Reports she will contact the [facility] to schedule a follow up r/t blurred vision in right eye.     Wellness/Community Resource Interventions Evaluation of current treatment plan related to Financial constraints related to utilities, request for a new mattress, request for safety railing and limited access to food.  Discussed importance of completing/maintaining outreach with the [facility], LCSW and [place] to ensure concerns r/t needed resources are addressed in a timely manner.  Collaborated with the [employer] team regarding plan for food resources. Millie reports a referral will be submitted to [employer] for temporary meal delivery. Also discussed with Mrs. Lanese options for utilizing local food banks to ensure she has needed food items  in the home. Advised to complete needed documents r/t utilities as soon as possible and return to the [employer]. Collaborated with the CCM LCSW regarding patient's request for a new mattress and safety railing outside of her home. Patient reports the mattress currently in her home is heavily soiled d/t incontinence. She will make arrangements to have the soiled mattress removed but requires assistance with obtaining a new top mattress. Also reports concerns regarding ability to safely enter/exit her home. She declined need for a safety/wheelchair ramp d/t only having two steps outside of her home. She is requesting to have a safety rail installed. LCSW will discuss options with the local volunteer group.    Patient Goals/Self-Care Activities: Take all  medications as prescribed Attend all scheduled provider appointments Contact pharmacy for medication refills Update the care management team with changes in functional status Complete needed documents and return to the [facility] the clinic for questions and new concerns as needed   Follow Up Plan:   Will follow up next week.     Lisa [name] verbalized understanding of the information discussed during the telephonic outreach. Declined need for mailed/printed instructions. A member of the care management team will follow up within the next week.   [facility] Care Management [facility] [phone]

## 2021-03-28 NOTE — Telephone Encounter (Signed)
   Telephone encounter was:  Successful.  03/28/2021 Name: Iretta Mangrum MRN: [id number] DOB: [birth year]  Jaydyn Menon is a 75 y.o. year old female who is a primary care patient of Alba Cory, MD . The community resource team was consulted for assistance with Food Insecurity and utilities.  Care guide performed the following interventions: Spoke with patient about submitting [email or web address] referral for [employer], filled out care manager fund request form to submit with copies of overdue utility bills when patient emails them to me. I will submit reques to [facility] once I receive copy of overdue utility bills.  Follow Up Plan:  Care guide will follow up with patient by phone over the next 7days and Client will email copies of overdue utility bills.  Mirai Greenwood, AAS Paralegal, [facility] Care Guide  Embedded Care Coordination [place]  Care Management  [address] [place], [place] [address] [email or web address]  ?? [phone]   [email or web address]

## 2021-03-28 NOTE — Chronic Care Management (AMB) (Signed)
Chronic Care Management    Clinical Social Work Note  03/28/2021 Name: Lisa [name] MRN: [id number] DOB: 1945-11-04  Lisa [name] is a 75 y.o. year old female who is a primary care patient of Steele Sizer, MD. The CCM team was consulted to assist the patient with chronic disease management and/or care coordination needs related to: [employer] .   Collaboration with CCM RNCM  for  coordination of patient's community resource needs  in response to provider referral for social work chronic care management and care coordination services.   Consent to Services:  The patient was given information about Chronic Care Management services, agreed to services, and gave verbal consent prior to initiation of services.  Please see initial visit note for detailed documentation.   Patient agreed to services and consent obtained.   Assessment: Review of patient past medical history, allergies, medications, and health status, including review of relevant consultants reports was performed today as part of a comprehensive evaluation and provision of chronic care management and care coordination services.     SDOH (Social Determinants of Health) assessments and interventions performed:    Advanced Directives Status: Not addressed in this encounter.  CCM Care Plan  Allergies  Allergen Reactions   Other Itching    Nucinta   Tapentadol Hcl     Outpatient Encounter Medications as of 03/28/2021  Medication Sig Note   amLODipine (NORVASC) 2.5 MG tablet Take 1 tablet (2.5 mg total) by mouth daily.    aspirin 81 MG tablet Take 81 mg by mouth daily.    atropine 1 % ophthalmic solution Place 1 drop into the right eye 3 (three) times daily. 02/26/2021: Last dispensed: 12/31/2020 for 34d supply    blood glucose meter kit and supplies Dispense based on patient and insurance preference. Use up to four times daily as directed. (FOR ICD-10 E10.9, E11.9).    Blood Glucose Monitoring Suppl (ACCU-CHEK AVIVA  CONNECT) w/Device KIT 1 each by Does not apply route daily. And supplies E11.9 check fsbs bid    brimonidine (ALPHAGAN) 0.2 % ophthalmic solution Place 1 drop into both eyes 2 (two) times daily. 02/26/2021: Last dispensed: 12/31/2020 for 50d supply   citalopram (CELEXA) 20 MG tablet Take 1 tablet (20 mg total) by mouth daily.    clopidogrel (PLAVIX) 75 MG tablet Take 1 tablet (75 mg total) by mouth daily.    Cyanocobalamin (B-12) 1000 MCG SUBL Place 1 tablet under the tongue daily.    dorzolamide-timolol (COSOPT) 22.3-6.8 MG/ML ophthalmic solution SMARTSIG:In Eye(s)    JANUVIA 100 MG tablet TAKE 1 TABLET BY MOUTH EVERY DAY    losartan (COZAAR) 100 MG tablet Take 1 tablet (100 mg total) by mouth daily.    Multiple Vitamin (MULTIVITAMIN) tablet Take 1 tablet by mouth daily.    pioglitazone (ACTOS) 15 MG tablet Take 1 tablet (15 mg total) by mouth daily. 02/26/2021: Not yet started   potassium chloride (KLOR-CON) 10 MEQ tablet Take 1 tablet (10 mEq total) by mouth 2 (two) times daily for 5 days.    prednisoLONE acetate (PRED FORTE) 1 % ophthalmic suspension Place into the right eye.    psyllium (METAMUCIL) 0.52 g capsule Take 1 capsule (0.52 g total) by mouth daily.    rosuvastatin (CRESTOR) 10 MG tablet Take 1 tablet (10 mg total) by mouth daily.    Vitamin D, Ergocalciferol, (DRISDOL) 1.25 MG (50000 UNIT) CAPS capsule TAKE 1 CAPSULE (50,000 UNITS TOTAL) BY MOUTH EVERY 7 (SEVEN) DAYS.    No facility-administered encounter  medications on file as of 03/28/2021.    Patient Active Problem List   Diagnosis Date Noted   AKI (acute kidney injury) ([place]) 02/26/2021   Hypokalemia 02/26/2021   Hypomagnesemia 02/26/2021   Bruising 02/26/2021   Syncope and collapse 02/26/2021   Prolonged QT interval 02/26/2021   Uncontrolled type 2 diabetes with eye complications 93/81/0175   DM (diabetes mellitus), type 2 with peripheral vascular complications ([place]) 03/19/8526   Amputation of left foot ([place]) 12/02/2014    Benign essential HTN 12/02/2014   Depression, major, recurrent, mild ([place]) 12/02/2014   Dyslipidemia 12/02/2014   Vitamin B12 deficiency 12/02/2014   Peripheral neuropathic pain 12/02/2014   Peripheral artery disease ([place]) 12/02/2014   Phantom limb ([place]) 12/02/2014   Diabetes mellitus with nephropathy ([place]) 12/02/2014   Detached retina 12/02/2014   Type 2 diabetes mellitus with peripheral neuropathy ([place]) 12/02/2014   Tobacco use 12/02/2014   Urge incontinence 12/02/2014   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   Vitamin D deficiency 12/02/2014    Conditions to be addressed/monitored: ;   HTN and DMII; Limited social support and Lack of essential utilities - financial challenges having difficulty paying utility bills, needs mattress and wheelchair ramp Care Plan : General Social Work (Adult)  Updates made by [employer], Darla Lesches, LCSW since 03/28/2021 12:00 AM     Problem: CHL AMB "PATIENT-SPECIFIC PROBLEM"   Note:   CARE PLAN ENTRY (see longitudinal plan of care for additional care plan information)  Current Barriers:  Patient with DM in need of assistance with connection to community resources  Knowledge deficits and need for support, education and care coordination related to community resources support  Limited social support and Lack of essential utilities - behind on payments*  Clinical Goal(s)  Over the next 90 days, patient will work with care management team member to address concerns related to identifying community resources for [employer]  Interventions provided by LCSW:  Assessed patient's care coordination needs related to stated financial challenges and discussed ongoing care management follow up  Provided patient with information about the [employer], [employer] and the Department of Social Services for possible emergency assistance Advised patient to follow up with these agencies for possible assistance Collaborated with appropriate clinical care team members  regarding patient needs Patient interviewed and appropriate assessments performed Collaborated with the Department of Social Services (community agency) re: emergency assistance Solution-Focused Strategies employed: related to options for [employer] Active listening / Reflection utilized  Emotional Support Provided related to financial strategies 03/27/21 Phone call to the [employer] to determine if they had emergency funds to assist with patient's utility bills-funds are available, patient instructed to call on Friday for an appointment-collaboration with a care guide regarding the [place] to determine eligibility limits also discussed. Follow up phone call to discuss options made to patient, Voicemail left for a return call. 03/28/21 Collaboration phone call with RNCM to clarify patient's community resource need focus. This social worker to provide care coordination for a wheelchair ramp and new mattress. This Education officer, museum will contact the [employer] regarding the mattress request as well as [employer] from the wheelchair [employer] through [employer] will also be contacted regarding possible wheelchair ramp  Patient Self Care Activities & Deficits:  Patient is unable to independently navigate community resource options without care coordination support  Acknowledges deficits and is motivated to resolve concern with encouragement Patient is able to contact [place]  as  discussed today Unable to perform IADLs independently Calls provider office for new concerns or questions  Initial goal documentation        Follow Up Plan: SW will follow up with patient by phone over the next 14 business days      [place], [place] Worker  [facility] Care Management [phone]

## 2021-03-28 NOTE — Chronic Care Management (AMB) (Signed)
Chronic Care Management   CCM RN Visit Note  03/28/2021 Name: Lisa [name] MRN: [id number] DOB: Mar 02, 1946  Subjective: Lisa [name] is a 75 y.o. year old female who is a primary care patient of Steele Sizer, MD. The care management team was consulted for assistance with disease management and care coordination needs.    Engaged with patient by telephone for follow up visit in response to provider referral for case management and care coordination services.   Consent to Services:  The patient was given information about Chronic Care Management services, agreed to services, and gave verbal consent prior to initiation of services.  Please see initial visit note for detailed documentation.   Assessment: Review of patient past medical history, allergies, medications, health status, including review of consultants reports, laboratory and other test data, was performed as part of comprehensive evaluation and provision of chronic care management services.   CCM Care Plan  Allergies  Allergen Reactions   Other Itching    Nucinta   Tapentadol Hcl     Outpatient Encounter Medications as of 03/28/2021  Medication Sig Note   amLODipine (NORVASC) 2.5 MG tablet Take 1 tablet (2.5 mg total) by mouth daily.    aspirin 81 MG tablet Take 81 mg by mouth daily.    atropine 1 % ophthalmic solution Place 1 drop into the right eye 3 (three) times daily. 02/26/2021: Last dispensed: 12/31/2020 for 34d supply    blood glucose meter kit and supplies Dispense based on patient and insurance preference. Use up to four times daily as directed. (FOR ICD-10 E10.9, E11.9).    Blood Glucose Monitoring Suppl (ACCU-CHEK AVIVA CONNECT) w/Device KIT 1 each by Does not apply route daily. And supplies E11.9 check fsbs bid    brimonidine (ALPHAGAN) 0.2 % ophthalmic solution Place 1 drop into both eyes 2 (two) times daily. 02/26/2021: Last dispensed: 12/31/2020 for 50d supply   citalopram (CELEXA) 20 MG tablet Take 1 tablet  (20 mg total) by mouth daily.    clopidogrel (PLAVIX) 75 MG tablet Take 1 tablet (75 mg total) by mouth daily.    Cyanocobalamin (B-12) 1000 MCG SUBL Place 1 tablet under the tongue daily.    dorzolamide-timolol (COSOPT) 22.3-6.8 MG/ML ophthalmic solution SMARTSIG:In Eye(s)    JANUVIA 100 MG tablet TAKE 1 TABLET BY MOUTH EVERY DAY    losartan (COZAAR) 100 MG tablet Take 1 tablet (100 mg total) by mouth daily.    Multiple Vitamin (MULTIVITAMIN) tablet Take 1 tablet by mouth daily.    pioglitazone (ACTOS) 15 MG tablet Take 1 tablet (15 mg total) by mouth daily. 02/26/2021: Not yet started   potassium chloride (KLOR-CON) 10 MEQ tablet Take 1 tablet (10 mEq total) by mouth 2 (two) times daily for 5 days.    prednisoLONE acetate (PRED FORTE) 1 % ophthalmic suspension Place into the right eye.    psyllium (METAMUCIL) 0.52 g capsule Take 1 capsule (0.52 g total) by mouth daily.    rosuvastatin (CRESTOR) 10 MG tablet Take 1 tablet (10 mg total) by mouth daily.    Vitamin D, Ergocalciferol, (DRISDOL) 1.25 MG (50000 UNIT) CAPS capsule TAKE 1 CAPSULE (50,000 UNITS TOTAL) BY MOUTH EVERY 7 (SEVEN) DAYS.    No facility-administered encounter medications on file as of 03/28/2021.    Patient Active Problem List   Diagnosis Date Noted   AKI (acute kidney injury) ([place]) 02/26/2021   Hypokalemia 02/26/2021   Hypomagnesemia 02/26/2021   Bruising 02/26/2021   Syncope and collapse 02/26/2021   Prolonged QT  interval 02/26/2021   Uncontrolled type 2 diabetes with eye complications 67/04/4579   DM (diabetes mellitus), type 2 with peripheral vascular complications ([place]) 99/83/3825   Amputation of left foot ([place]) 12/02/2014   Benign essential HTN 12/02/2014   Depression, major, recurrent, mild ([place]) 12/02/2014   Dyslipidemia 12/02/2014   Vitamin B12 deficiency 12/02/2014   Peripheral neuropathic pain 12/02/2014   Peripheral artery disease ([place]) 12/02/2014   Phantom limb ([place]) 12/02/2014   Diabetes mellitus with  nephropathy ([place]) 12/02/2014   Detached retina 12/02/2014   Type 2 diabetes mellitus with peripheral neuropathy ([place]) 12/02/2014   Tobacco use 12/02/2014   Urge incontinence 12/02/2014   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   Vitamin D deficiency 12/02/2014    Conditions to be addressed/monitored:HTN, DMII, Fall Risk, Community Resources Patient Care Plan: RN Care Manager Plan of Care     Problem Identified: HTN,DM, High Fall Risk      Long-Range Goal: Disease Progression Prevented or Minimized   Start Date: 03/21/2021  Expected End Date: 06/19/2021  Priority: High  Note:   Current Barriers:  Chronic Disease Management support and education needs related to HTN, DMII, and High Fall Risk  RNCM Clinical Goal(s):  Patient will demonstrate ongoing adherence to prescribed treatment plan for HTN, DMII, and Fall Risk. Patient will work with [employer] care guide to address needs related to  Financial constraints related to nutrition and utilities through collaboration with Consulting civil engineer, provider, and care team.   Interventions: 1:1 collaboration with primary care provider regarding development and update of comprehensive plan of care as evidenced by provider attestation and co-signature Inter-disciplinary care team collaboration (see longitudinal plan of care) Evaluation of current treatment plan related to  self management and patient's adherence to plan as established by provider  Last practice recorded BP readings:  BP Readings from Last 3 Encounters:  03/19/21 (!) 178/74  03/03/21 (!) 148/59  02/20/21 136/74  Most recent eGFR/CrCl:  Lab Results  Component Value Date   EGFR 72 02/20/2021    No components found for: CRCL   Hypertension Interventions: Reviewed medications and importance of compliance. Advised to continue taking medication as prescribed. Advised to notify her provider if unable to tolerate the prescribed regimen. Advised to update the care management team with  concerns regarding medication management or prescription cost. Provided information regarding established blood pressure parameters along with indications for notifying a provider. Encouraged to monitor and record readings.  Discussed compliance with recommended cardiac prudent diet. Encouraged to read nutrition labels, monitor sodium intake and avoid highly processed foods when possible. Expressed concerns regarding meals and nutritional resources. Referral was placed for outreach with the [facility]. Discussed complications of uncontrolled blood pressure.  Reviewed s/sx of heart attack, stroke and worsening symptoms that require immediate medical attention.  Falls Interventions: Reviewed medications and discussed potential side effects such as dizziness, lightheadedness and frequent urination. Provided information regarding safety and fall prevention. Risk for falls is increased d/t impaired gait and impaired vision. Reports having a cane and walker available to use as needed in the home. Reports currently receiving Home Health services. Advised to follow precautions and activity restrictions as instructed by the physical therapy team. Discussed ability to perform ADL's and tasks in the home. Currently lives alone.  Reports completing ADL's independently. Reports her brother stays occasionally to assist with task in the home and transportation. Advised to update the care management team of changes in functional status. Update on 03/28/21: Mrs. Scalf reports not  receiving needed incontinence supplies from the previous vendor. Currently requires disposable adult briefs and bed pads. Voiced concerns regarding price of supplies when attempting to purchase in stores. She is agreeable to completing the request with [employer]. Will contact [employer] and forwarded needed documents to her primary provider.    Lab Results  Component Value Date   HGBA1C 6.2 (A) 02/20/2021    Diabetes Interventions: Assessed patient's understanding of A1c goal: <7% Reviewed medications. Advised to take medications as prescribed. Advised to update the care management team with concerns regarding medication management or prescription cost. Provided information regarding importance of consistent blood glucose monitoring. Reports not monitoring blood glucose consistently d/t glucometer being broken. She will contact her insurance provider to confirmed preferred glucometer to decrease out of pocket cost. Will update/message provider for new order and supplies once preferred glucometer is confirmed.  Reviewed s/sx of hypoglycemia and hyperglycemia along with recommended interventions. Discussed nutritional intake and importance of complying with a carb modified/diabetic diet. She required assistance with nutritional resources. Referral submitted for outreach with the [facility]. Discussed and offered referrals for available Diabetes education classes. Declined current need for additional diabetes education. Her A1C is currently at goal of less than 7%. Will continue to outreach to assist with optimal glycemic control. Discussed importance of completing recommended DM preventive care. Reports completing recommended foot care. Toes amputated from left foot. Reports completing regular eye exams d/t glaucoma and loss of vision in left eye. Reports she will contact the [facility] to schedule a follow up r/t blurred vision in right eye.     Wellness/Community Resource Interventions Evaluation of current treatment plan related to Financial constraints related to utilities, request for a new mattress, request for safety railing and limited access to food.  Discussed importance of completing/maintaining outreach with the [facility], LCSW and Why to ensure concerns r/t needed resources are addressed in a timely manner.  Collaborated with the [employer] team  regarding plan for food resources. Millie reports a referral will be submitted to [employer] for temporary meal delivery. Also discussed with Mrs. Pence options for utilizing local food banks to ensure she has needed food items in the home. Advised to complete needed documents r/t utilities as soon as possible and return to the [employer]. Collaborated with the CCM LCSW regarding patient's request for a new mattress and safety railing outside of her home. Patient reports the mattress currently in her home is heavily soiled d/t incontinence. She will make arrangements to have the soiled mattress removed but requires assistance with obtaining a new top mattress. Also reports concerns regarding ability to safely enter/exit her home. She declined need for a safety/wheelchair ramp d/t only having two steps outside of her home. She is requesting to have a safety rail installed. LCSW will discuss options with the local volunteer group.    Patient Goals/Self-Care Activities: Take all  medications as prescribed Attend all scheduled provider appointments Contact pharmacy for medication refills Update the care management team with changes in functional status Complete needed documents and return to the [facility] the clinic for questions and new concerns as needed   Follow Up Plan:   Will follow up next week.      PLAN A member of the care management team will follow up within the next week.    [facility] Care Management [facility] [phone]

## 2021-03-29 ENCOUNTER — Ambulatory Visit: Payer: Medicare Other | Admitting: *Deleted

## 2021-03-29 DIAGNOSIS — E1159 Type 2 diabetes mellitus with other circulatory complications: Secondary | ICD-10-CM

## 2021-03-29 DIAGNOSIS — E119 Type 2 diabetes mellitus without complications: Secondary | ICD-10-CM

## 2021-03-29 DIAGNOSIS — Z9181 History of falling: Secondary | ICD-10-CM

## 2021-03-29 NOTE — Chronic Care Management (AMB) (Signed)
Chronic Care Management    Clinical Social Work Note  03/29/2021 Name: Lema Heinkel MRN: [id number] DOB: 03-Apr-1946  Dhamar Gregory is a 75 y.o. year old female who is a primary care patient of Steele Sizer, MD. The CCM team was consulted to assist the patient with chronic disease management and/or care coordination needs related to: [employer] .   Collaboration with [employer]  to  arrange requested mattress  in response to provider referral for social work chronic care management and care coordination services.   Consent to Services:  The patient was given information about Chronic Care Management services, agreed to services, and gave verbal consent prior to initiation of services.  Please see initial visit note for detailed documentation.   Patient agreed to services and consent obtained.   Assessment: Review of patient past medical history, allergies, medications, and health status, including review of relevant consultants reports was performed today as part of a comprehensive evaluation and provision of chronic care management and care coordination services.     SDOH (Social Determinants of Health) assessments and interventions performed:    Advanced Directives Status: Not addressed in this encounter.  CCM Care Plan  Allergies  Allergen Reactions   Other Itching    Nucinta   Tapentadol Hcl     Outpatient Encounter Medications as of 03/29/2021  Medication Sig Note   amLODipine (NORVASC) 2.5 MG tablet Take 1 tablet (2.5 mg total) by mouth daily.    aspirin 81 MG tablet Take 81 mg by mouth daily.    atropine 1 % ophthalmic solution Place 1 drop into the right eye 3 (three) times daily. 02/26/2021: Last dispensed: 12/31/2020 for 34d supply    blood glucose meter kit and supplies Dispense based on patient and insurance preference. Use up to four times daily as directed. (FOR ICD-10 E10.9, E11.9).    Blood Glucose Monitoring Suppl (ACCU-CHEK AVIVA  CONNECT) w/Device KIT 1 each by Does not apply route daily. And supplies E11.9 check fsbs bid    brimonidine (ALPHAGAN) 0.2 % ophthalmic solution Place 1 drop into both eyes 2 (two) times daily. 02/26/2021: Last dispensed: 12/31/2020 for 50d supply   citalopram (CELEXA) 20 MG tablet Take 1 tablet (20 mg total) by mouth daily.    clopidogrel (PLAVIX) 75 MG tablet Take 1 tablet (75 mg total) by mouth daily.    Cyanocobalamin (B-12) 1000 MCG SUBL Place 1 tablet under the tongue daily.    dorzolamide-timolol (COSOPT) 22.3-6.8 MG/ML ophthalmic solution SMARTSIG:In Eye(s)    JANUVIA 100 MG tablet TAKE 1 TABLET BY MOUTH EVERY DAY    losartan (COZAAR) 100 MG tablet Take 1 tablet (100 mg total) by mouth daily.    Multiple Vitamin (MULTIVITAMIN) tablet Take 1 tablet by mouth daily.    pioglitazone (ACTOS) 15 MG tablet Take 1 tablet (15 mg total) by mouth daily. 02/26/2021: Not yet started   potassium chloride (KLOR-CON) 10 MEQ tablet Take 1 tablet (10 mEq total) by mouth 2 (two) times daily for 5 days.    prednisoLONE acetate (PRED FORTE) 1 % ophthalmic suspension Place into the right eye.    psyllium (METAMUCIL) 0.52 g capsule Take 1 capsule (0.52 g total) by mouth daily.    rosuvastatin (CRESTOR) 10 MG tablet Take 1 tablet (10 mg total) by mouth daily.    Vitamin D, Ergocalciferol, (DRISDOL) 1.25 MG (50000 UNIT) CAPS capsule TAKE 1 CAPSULE (50,000 UNITS TOTAL) BY MOUTH EVERY 7 (SEVEN) DAYS.    No facility-administered encounter medications on  file as of 03/29/2021.    Patient Active Problem List   Diagnosis Date Noted   AKI (acute kidney injury) ([place]) 02/26/2021   Hypokalemia 02/26/2021   Hypomagnesemia 02/26/2021   Bruising 02/26/2021   Syncope and collapse 02/26/2021   Prolonged QT interval 02/26/2021   Uncontrolled type 2 diabetes with eye complications 19/41/7408   DM (diabetes mellitus), type 2 with peripheral vascular complications ([place]) 14/48/1856   Amputation of left foot ([place]) 12/02/2014    Benign essential HTN 12/02/2014   Depression, major, recurrent, mild ([place]) 12/02/2014   Dyslipidemia 12/02/2014   Vitamin B12 deficiency 12/02/2014   Peripheral neuropathic pain 12/02/2014   Peripheral artery disease ([place]) 12/02/2014   Phantom limb ([place]) 12/02/2014   Diabetes mellitus with nephropathy ([place]) 12/02/2014   Detached retina 12/02/2014   Type 2 diabetes mellitus with peripheral neuropathy ([place]) 12/02/2014   Tobacco use 12/02/2014   Urge incontinence 12/02/2014   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   Vitamin D deficiency 12/02/2014   Conditions to be addressed/monitored:   HTN and DMII; Limited social support and Lack of essential utilities - financial challenges having difficulty paying utility bills, needs mattress and wheelchair ramp  Care Plan : General Social Work (Adult)  Updates made by [employer], Darla Lesches, LCSW since 03/29/2021 12:00 AM     Problem: CHL AMB "PATIENT-SPECIFIC PROBLEM"   Note:   CARE PLAN ENTRY (see longitudinal plan of care for additional care plan information)  Current Barriers:  Patient with DM in need of assistance with connection to community resources  Knowledge deficits and need for support, education and care coordination related to community resources support  Limited social support and Lack of essential utilities - behind on payments*  Clinical Goal(s)  Over the next 90 days, patient will work with care management team member to address concerns related to identifying community resources for [employer]  Interventions provided by LCSW:  Assessed patient's care coordination needs related to stated financial challenges and discussed ongoing care management follow up  Provided patient with information about the [employer], [employer] [address] and the Department of Social Services for possible emergency assistance Advised patient to follow up with these agencies for possible assistance Collaborated with appropriate clinical care team members  regarding patient needs Patient interviewed and appropriate assessments performed Collaborated with the Department of Social Services (community agency) re: emergency assistance Solution-Focused Strategies employed: related to options for [employer] Active listening / Reflection utilized  Emotional Support Provided related to financial strategies 03/27/21 Phone call to the [employer] to determine if they had emergency funds to assist with patient's utility bills-funds are available, patient instructed to call on Friday for an appointment-collaboration with a care guide regarding the [place] to determine eligibility limits also discussed. Follow up phone call to discuss options made to patient, Voicemail left for a return call. 03/28/21 Collaboration phone call with RNCM to clarify patient's community resource need focus. This social worker to provide care coordination for a wheelchair ramp and new mattress. This Education officer, museum will contact the [employer] regarding the mattress request as well as [employer] from the wheelchair [employer] through [employer] will also be contacted regarding possible wheelchair ramp 03/29/21 Phone call to the [employer], confirmed that they have full and queen size mattresses . They will not remove old mattress, delivery will be on 04/06/21.  03/29/21 Collaboration with RNCM who confirmed that patient is agreeable to the queen size mattress  only. RNCM further confirmed that patient is in need of rails on her front steps, no wheelchair ramp requested at this time Referral completed requesting the mattress through [place]   Patient Self Care Activities & Deficits:  Patient is unable to independently navigate community resource options without care coordination support  Acknowledges deficits and is motivated to resolve concern with encouragement Patient is able  to contact [address]  as discussed today Unable to perform IADLs independently Calls provider office for new concerns or questions  Initial goal documentation        Follow Up Plan: SW will follow up with patient by phone over the next 14 business days      [employer], [place] Worker  [facility] Care Management [phone]

## 2021-04-01 ENCOUNTER — Ambulatory Visit: Payer: Medicare Other | Admitting: *Deleted

## 2021-04-01 DIAGNOSIS — Z9181 History of falling: Secondary | ICD-10-CM

## 2021-04-01 DIAGNOSIS — Z789 Other specified health status: Secondary | ICD-10-CM

## 2021-04-01 DIAGNOSIS — E119 Type 2 diabetes mellitus without complications: Secondary | ICD-10-CM

## 2021-04-01 DIAGNOSIS — I152 Hypertension secondary to endocrine disorders: Secondary | ICD-10-CM

## 2021-04-01 NOTE — Chronic Care Management (AMB) (Signed)
Chronic Care Management    Clinical Social Work Note  04/01/2021 Name: Lisa [name] MRN: [id number] DOB: 1946/04/20  Lisa [name] is a 75 y.o. year old female who is a primary care patient of Steele Sizer, MD. The CCM team was consulted to assist the patient with chronic disease management and/or care coordination needs related to: [employer] .   Engaged with patient by telephone for follow up visit in response to provider referral for social work chronic care management and care coordination services.   Consent to Services:  The patient was given information about Chronic Care Management services, agreed to services, and gave verbal consent prior to initiation of services.  Please see initial visit note for detailed documentation.   Patient agreed to services and consent obtained.   Assessment: Review of patient past medical history, allergies, medications, and health status, including review of relevant consultants reports was performed today as part of a comprehensive evaluation and provision of chronic care management and care coordination services.     SDOH (Social Determinants of Health) assessments and interventions performed:    Advanced Directives Status: Not addressed in this encounter.  CCM Care Plan  Allergies  Allergen Reactions   Other Itching    Nucinta   Tapentadol Hcl     Outpatient Encounter Medications as of 04/01/2021  Medication Sig Note   amLODipine (NORVASC) 2.5 MG tablet Take 1 tablet (2.5 mg total) by mouth daily.    aspirin 81 MG tablet Take 81 mg by mouth daily.    atropine 1 % ophthalmic solution Place 1 drop into the right eye 3 (three) times daily. 02/26/2021: Last dispensed: 12/31/2020 for 34d supply    blood glucose meter kit and supplies Dispense based on patient and insurance preference. Use up to four times daily as directed. (FOR ICD-10 E10.9, E11.9).    Blood Glucose Monitoring Suppl (ACCU-CHEK AVIVA CONNECT) w/Device KIT 1 each by  Does not apply route daily. And supplies E11.9 check fsbs bid    brimonidine (ALPHAGAN) 0.2 % ophthalmic solution Place 1 drop into both eyes 2 (two) times daily. 02/26/2021: Last dispensed: 12/31/2020 for 50d supply   citalopram (CELEXA) 20 MG tablet Take 1 tablet (20 mg total) by mouth daily.    clopidogrel (PLAVIX) 75 MG tablet Take 1 tablet (75 mg total) by mouth daily.    Cyanocobalamin (B-12) 1000 MCG SUBL Place 1 tablet under the tongue daily.    dorzolamide-timolol (COSOPT) 22.3-6.8 MG/ML ophthalmic solution SMARTSIG:In Eye(s)    JANUVIA 100 MG tablet TAKE 1 TABLET BY MOUTH EVERY DAY    losartan (COZAAR) 100 MG tablet Take 1 tablet (100 mg total) by mouth daily.    Multiple Vitamin (MULTIVITAMIN) tablet Take 1 tablet by mouth daily.    pioglitazone (ACTOS) 15 MG tablet Take 1 tablet (15 mg total) by mouth daily. 02/26/2021: Not yet started   potassium chloride (KLOR-CON) 10 MEQ tablet Take 1 tablet (10 mEq total) by mouth 2 (two) times daily for 5 days.    prednisoLONE acetate (PRED FORTE) 1 % ophthalmic suspension Place into the right eye.    psyllium (METAMUCIL) 0.52 g capsule Take 1 capsule (0.52 g total) by mouth daily.    rosuvastatin (CRESTOR) 10 MG tablet Take 1 tablet (10 mg total) by mouth daily.    Vitamin D, Ergocalciferol, (DRISDOL) 1.25 MG (50000 UNIT) CAPS capsule TAKE 1 CAPSULE (50,000 UNITS TOTAL) BY MOUTH EVERY 7 (SEVEN) DAYS.    No facility-administered encounter medications on file as of  04/01/2021.    Patient Active Problem List   Diagnosis Date Noted   AKI (acute kidney injury) ([place]) 02/26/2021   Hypokalemia 02/26/2021   Hypomagnesemia 02/26/2021   Bruising 02/26/2021   Syncope and collapse 02/26/2021   Prolonged QT interval 02/26/2021   Uncontrolled type 2 diabetes with eye complications 53/66/4403   DM (diabetes mellitus), type 2 with peripheral vascular complications ([place]) 47/42/5956   Amputation of left foot ([place]) 12/02/2014   Benign essential HTN 12/02/2014    Depression, major, recurrent, mild ([place]) 12/02/2014   Dyslipidemia 12/02/2014   Vitamin B12 deficiency 12/02/2014   Peripheral neuropathic pain 12/02/2014   Peripheral artery disease ([place]) 12/02/2014   Phantom limb ([place]) 12/02/2014   Diabetes mellitus with nephropathy ([place]) 12/02/2014   Detached retina 12/02/2014   Type 2 diabetes mellitus with peripheral neuropathy ([place]) 12/02/2014   Tobacco use 12/02/2014   Urge incontinence 12/02/2014   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   Vitamin D deficiency 12/02/2014    Conditions to be addressed/monitored:  HTN and DMII; Limited social support and Lack of essential utilities - financial challenges having difficulty paying utility bills, needs mattress and rails for steps to her home   Care Plan : General Social Work (Adult)  Updates made by [employer], Baruch Gouty M, LCSW since 04/01/2021 12:00 AM     Problem: CHL AMB "PATIENT-SPECIFIC PROBLEM"   Note:   CARE PLAN ENTRY (see longitudinal plan of care for additional care plan information)  Current Barriers:  Patient with DM in need of assistance with connection to community resources  Knowledge deficits and need for support, education and care coordination related to community resources support  Limited social support and Lack of essential utilities - behind on payments*  Clinical Goal(s)  Over the next 90 days, patient will work with care management team member to address concerns related to identifying community resources for [employer]  Interventions provided by LCSW:  Assessed patient's care coordination needs related to stated financial challenges and discussed ongoing care management follow up  Collaboration phone call to the [place] to confirm mattress [employer] are donated, however they do not accept soiled mattresses, mattresses that come from homes of smokers or pets. Mattresses are cleaned but not extensively Provided patient with  additional information received by the [facility] open to considering the mattress and will make final decision this afternoon Contact information provided for the [place] for assistance with railing for the stairs leading up to her home [phone] Advised patient to follow up with this  agency as well as Care Guide for possible emergency assistance for her utilities Collaborated with appropriate clinical care team members(CCM [facility]) regarding patient needs Emotional support provided related to patients community resource needs Active listening / Reflection utilized  04/01/21 3 pm  Follow up phone call to discuss decision regarding the mattress-Voicemail left for a return call  Patient Self Care Activities & Deficits:  Patient is unable to independently navigate community resource options without care coordination support  Acknowledges deficits and is motivated to resolve concern with encouragement Patient is able to contact [place]  as discussed today Unable to perform IADLs independently Calls provider office for new concerns or questions  Please see past updates related to this goal by clicking on the "Past Updates" button in the selected goal         Follow Up Plan: SW will follow up with patient by phone over the next 14 business  days      Larya Charpentier, LCSW Clinical Social Worker  [facility] Care Management [phone]    

## 2021-04-01 NOTE — Patient Instructions (Signed)
Visit Information  PATIENT GOALS/PLAN OF CARE:  Care Plan : General Social Work (Adult)  Updates made by [employer], Clent Jacks, LCSW since 04/01/2021 12:00 AM     Problem: CHL AMB "PATIENT-SPECIFIC PROBLEM"   Note:   CARE PLAN ENTRY (see longitudinal plan of care for additional care plan information)  Current Barriers:  Patient with DM in need of assistance with connection to community resources  Knowledge deficits and need for support, education and care coordination related to community resources support  Limited social support and Lack of essential utilities - behind on payments*  Clinical Goal(s)  Over the next 90 days, patient will work with care management team member to address concerns related to identifying community resources for [name]  Interventions provided by LCSW:  Assessed patient's care coordination needs related to stated financial challenges and discussed ongoing care management follow up  Collaboration phone call to the [employer] to confirm mattress [employer] are donated, however they do not accept soiled mattresses, mattresses that come from homes of smokers or pets. Mattresses are cleaned but not extensively Provided patient with additional information received by the [facility] open to considering the mattress and will make final decision this afternoon Contact information provided for the [facility] [employer] for assistance with railing for the stairs leading up to her home [phone] Advised patient to follow up with this  agency as well as Care Guide for possible emergency assistance for her utilities Collaborated with appropriate clinical care team members(CCM [facility]) regarding patient needs Emotional support provided related to patients community resource needs Active listening / Reflection utilized  04/01/21 3 pm  Follow up phone call to discuss decision regarding the  mattress-Voicemail left for a return call  Patient Self Care Activities & Deficits:  Patient is unable to independently navigate community resource options without care coordination support  Acknowledges deficits and is motivated to resolve concern with encouragement Patient is able to contact [address]  as discussed today Unable to perform IADLs independently Calls provider office for new concerns or questions  Please see past updates related to this goal by clicking on the "Past Updates" button in the selected goal       The patient verbalized understanding of instructions, educational materials, and care plan provided today and declined offer to receive copy of patient instructions, educational materials, and care plan.   Telephone follow up appointment with care management team member scheduled for: 04/15/21  Verna Czech, LCSW Clinical Social Worker  [facility] Care Management [phone]

## 2021-04-02 DIAGNOSIS — E1151 Type 2 diabetes mellitus with diabetic peripheral angiopathy without gangrene: Secondary | ICD-10-CM | POA: Diagnosis not present

## 2021-04-02 DIAGNOSIS — H409 Unspecified glaucoma: Secondary | ICD-10-CM | POA: Diagnosis not present

## 2021-04-02 DIAGNOSIS — E785 Hyperlipidemia, unspecified: Secondary | ICD-10-CM | POA: Diagnosis not present

## 2021-04-02 DIAGNOSIS — I10 Essential (primary) hypertension: Secondary | ICD-10-CM | POA: Diagnosis not present

## 2021-04-02 DIAGNOSIS — H5461 Unqualified visual loss, right eye, normal vision left eye: Secondary | ICD-10-CM | POA: Diagnosis not present

## 2021-04-05 ENCOUNTER — Ambulatory Visit: Payer: Self-pay

## 2021-04-05 ENCOUNTER — Telehealth: Payer: Self-pay

## 2021-04-05 DIAGNOSIS — Z9181 History of falling: Secondary | ICD-10-CM

## 2021-04-05 DIAGNOSIS — I152 Hypertension secondary to endocrine disorders: Secondary | ICD-10-CM

## 2021-04-05 DIAGNOSIS — E1159 Type 2 diabetes mellitus with other circulatory complications: Secondary | ICD-10-CM

## 2021-04-05 DIAGNOSIS — E119 Type 2 diabetes mellitus without complications: Secondary | ICD-10-CM

## 2021-04-05 NOTE — Patient Instructions (Signed)
Thank you for allowing the Chronic Care Management team to participate in your care.  

## 2021-04-05 NOTE — Chronic Care Management (AMB) (Signed)
Chronic Care Management   CCM RN Visit Note  04/05/2021 Name: Lisa [name] MRN: [id number] DOB: 19-May-1946  Subjective: Kristilyn Coltrane is a 75 y.o. year old female who is a primary care patient of Steele Sizer, MD. The care management team was consulted for assistance with disease management and care coordination needs.    Engaged with patient by telephone for follow up visit in response to provider referral for case management and care coordination services.   Consent to Services:  The patient was given information about Chronic Care Management services, agreed to services, and gave verbal consent prior to initiation of services.  Please see initial visit note for detailed documentation.   Assessment: Review of patient past medical history, allergies, medications, health status, including review of consultants reports, laboratory and other test data, was performed as part of comprehensive evaluation and provision of chronic care management services.   SDOH (Social Determinants of Health) assessments and interventions performed: No  CCM Care Plan  Allergies  Allergen Reactions   Other Itching    Nucinta   Tapentadol Hcl     Outpatient Encounter Medications as of 04/05/2021  Medication Sig Note   amLODipine (NORVASC) 2.5 MG tablet Take 1 tablet (2.5 mg total) by mouth daily.    aspirin 81 MG tablet Take 81 mg by mouth daily.    atropine 1 % ophthalmic solution Place 1 drop into the right eye 3 (three) times daily. 02/26/2021: Last dispensed: 12/31/2020 for 34d supply    blood glucose meter kit and supplies Dispense based on patient and insurance preference. Use up to four times daily as directed. (FOR ICD-10 E10.9, E11.9).    Blood Glucose Monitoring Suppl (ACCU-CHEK AVIVA CONNECT) w/Device KIT 1 each by Does not apply route daily. And supplies E11.9 check fsbs bid    brimonidine (ALPHAGAN) 0.2 % ophthalmic solution Place 1 drop into both eyes 2 (two) times daily. 02/26/2021: Last  dispensed: 12/31/2020 for 50d supply   citalopram (CELEXA) 20 MG tablet Take 1 tablet (20 mg total) by mouth daily.    clopidogrel (PLAVIX) 75 MG tablet Take 1 tablet (75 mg total) by mouth daily.    Cyanocobalamin (B-12) 1000 MCG SUBL Place 1 tablet under the tongue daily.    dorzolamide-timolol (COSOPT) 22.3-6.8 MG/ML ophthalmic solution SMARTSIG:In Eye(s)    JANUVIA 100 MG tablet TAKE 1 TABLET BY MOUTH EVERY DAY    losartan (COZAAR) 100 MG tablet Take 1 tablet (100 mg total) by mouth daily.    Multiple Vitamin (MULTIVITAMIN) tablet Take 1 tablet by mouth daily.    pioglitazone (ACTOS) 15 MG tablet Take 1 tablet (15 mg total) by mouth daily. 02/26/2021: Not yet started   potassium chloride (KLOR-CON) 10 MEQ tablet Take 1 tablet (10 mEq total) by mouth 2 (two) times daily for 5 days.    prednisoLONE acetate (PRED FORTE) 1 % ophthalmic suspension Place into the right eye.    psyllium (METAMUCIL) 0.52 g capsule Take 1 capsule (0.52 g total) by mouth daily.    rosuvastatin (CRESTOR) 10 MG tablet Take 1 tablet (10 mg total) by mouth daily.    Vitamin D, Ergocalciferol, (DRISDOL) 1.25 MG (50000 UNIT) CAPS capsule TAKE 1 CAPSULE (50,000 UNITS TOTAL) BY MOUTH EVERY 7 (SEVEN) DAYS.    No facility-administered encounter medications on file as of 04/05/2021.    Patient Active Problem List   Diagnosis Date Noted   AKI (acute kidney injury) ([place]) 02/26/2021   Hypokalemia 02/26/2021   Hypomagnesemia 02/26/2021   Bruising  02/26/2021   Syncope and collapse 02/26/2021   Prolonged QT interval 02/26/2021   Uncontrolled type 2 diabetes with eye complications 68/34/1962   DM (diabetes mellitus), type 2 with peripheral vascular complications ([place]) 22/97/9892   Amputation of left foot ([place]) 12/02/2014   Benign essential HTN 12/02/2014   Depression, major, recurrent, mild ([place]) 12/02/2014   Dyslipidemia 12/02/2014   Vitamin B12 deficiency 12/02/2014   Peripheral neuropathic pain 12/02/2014   Peripheral  artery disease ([place]) 12/02/2014   Phantom limb ([place]) 12/02/2014   Diabetes mellitus with nephropathy ([place]) 12/02/2014   Detached retina 12/02/2014   Type 2 diabetes mellitus with peripheral neuropathy ([place]) 12/02/2014   Tobacco use 12/02/2014   Urge incontinence 12/02/2014   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   Vitamin D deficiency 12/02/2014    Patient Care Plan: RN Care Manager Plan of Care     Problem Identified: HTN,DM, High Fall Risk      Long-Range Goal: Disease Progression Prevented or Minimized   Start Date: 03/21/2021  Expected End Date: 06/19/2021  Priority: High  Note:   Current Barriers:  Chronic Disease Management support and education needs related to HTN, DMII, and High Fall Risk  RNCM Clinical Goal(s):  Patient will demonstrate ongoing adherence to prescribed treatment plan for HTN, DMII, and Fall Risk. Patient will work with [employer] care guide to address needs related to  Financial constraints related to nutrition and utilities through collaboration with Consulting civil engineer, provider, and care team.   Interventions: 1:1 collaboration with primary care provider regarding development and update of comprehensive plan of care as evidenced by provider attestation and co-signature Inter-disciplinary care team collaboration (see longitudinal plan of care) Evaluation of current treatment plan related to  self management and patient's adherence to plan as established by provider  Last practice recorded BP readings:  BP Readings from Last 3 Encounters:  03/19/21 (!) 178/74  03/03/21 (!) 148/59  02/20/21 136/74  Most recent eGFR/CrCl:  Lab Results  Component Value Date   EGFR 72 02/20/2021    No components found for: CRCL   Hypertension Interventions: Reviewed medications and importance of compliance. Advised to continue taking medication as prescribed. Advised to notify her provider if unable to tolerate the prescribed regimen. Advised to update the care management  team with concerns regarding medication management or prescription cost. Provided information regarding established blood pressure parameters along with indications for notifying a provider. Encouraged to monitor and record readings.  Discussed compliance with recommended cardiac prudent diet. Encouraged to read nutrition labels, monitor sodium intake and avoid highly processed foods when possible. Expressed concerns regarding meals and nutritional resources. Referral was placed for outreach with the [place]. Discussed complications of uncontrolled blood pressure.  Reviewed s/sx of heart attack, stroke and worsening symptoms that require immediate medical attention.  Falls Interventions: Reviewed medications and discussed potential side effects such as dizziness, lightheadedness and frequent urination. Provided information regarding safety and fall prevention. Risk for falls is increased d/t impaired gait and impaired vision. Reports having a cane and walker available to use as needed in the home. Reports currently receiving Home Health services. Advised to follow precautions and activity restrictions as instructed by the physical therapy team. Discussed ability to perform ADL's and tasks in the home. Currently lives alone.  Reports completing ADL's independently. Reports her brother stays occasionally to assist with task in the home and transportation. Advised to update the care management team of changes in functional status. Update on 03/28/21: Mrs. Sulton  reports not receiving needed incontinence supplies from the previous vendor. Currently requires disposable adult briefs and bed pads. Voiced concerns regarding price of supplies when attempting to purchase in stores. She is agreeable to completing the request with [employer]. Will contact [employer] and forwarded needed documents to her primary provider.    Lab Results  Component Value Date   HGBA1C 6.2 (A)  02/20/2021   Diabetes Interventions: Assessed patient's understanding of A1c goal: <7% Reviewed medications. Advised to take medications as prescribed. Advised to update the care management team with concerns regarding medication management or prescription cost. Provided information regarding importance of consistent blood glucose monitoring. Reports not monitoring blood glucose consistently d/t glucometer being broken. She will contact her insurance provider to confirmed preferred glucometer to decrease out of pocket cost. Will update/message provider for new order and supplies once preferred glucometer is confirmed.  Reviewed s/sx of hypoglycemia and hyperglycemia along with recommended interventions. Discussed nutritional intake and importance of complying with a carb modified/diabetic diet. She required assistance with nutritional resources. Referral submitted for outreach with the [place]. Discussed and offered referrals for available Diabetes education classes. Declined current need for additional diabetes education. Her A1C is currently at goal of less than 7%. Will continue to outreach to assist with optimal glycemic control. Discussed importance of completing recommended DM preventive care. Reports completing recommended foot care. Toes amputated from left foot. Reports completing regular eye exams d/t glaucoma and loss of vision in left eye. Reports she will contact the [facility] to schedule a follow up r/t blurred vision in right eye.     Wellness/Community Resource Interventions Evaluation of current treatment plan related to Financial constraints related to utilities, request for a new mattress, request for safety railing and limited access to food.  Discussed importance of completing/maintaining outreach with the [facility], LCSW and [place] to ensure concerns r/t needed resources are addressed in a timely manner.  Collaborated with the [employer] team regarding plan for food resources. Millie reports a referral will be submitted to [employer] for temporary meal delivery. Also discussed with Mrs. Eroh options for utilizing local food banks to ensure she has needed food items in the home. Advised to complete needed documents r/t utilities as soon as possible and return to the [employer]. Collaborated with the CCM LCSW regarding patient's request for a new mattress and safety railing outside of her home. Patient reports the mattress currently in her home is heavily soiled d/t incontinence. She will make arrangements to have the soiled mattress removed but requires assistance with obtaining a new top mattress. Also reports concerns regarding ability to safely enter/exit her home. She declined need for a safety/wheelchair ramp d/t only having two steps outside of her home. She is requesting to have a safety rail installed. LCSW will discuss options with the local volunteer group.    Patient Goals/Self-Care Activities: Take all  medications as prescribed Attend all scheduled provider appointments Contact pharmacy for medication refills Update the care management team with changes in functional status Complete needed documents and return to the [facility] the clinic for questions and new concerns as needed   Follow Up Plan:   Will follow up next week.    Patient Care Plan: General Social Work (Adult)     Problem Identified: Social and Functional Symptoms      Problem Identified: CHL AMB "PATIENT-SPECIFIC PROBLEM"   Note:   CARE  PLAN ENTRY (see longitudinal plan of care for additional care plan information)  Current Barriers:  Patient with DM in need of assistance with connection to community resources  Knowledge deficits and need for support, education and care coordination related to community resources support  Limited social support and Lack of essential utilities -  behind on payments*  Clinical Goal(s)  Over the next 90 days, patient will work with care management team member to address concerns related to identifying community resources for [employer]  Interventions provided by LCSW:  Assessed patient's care coordination needs related to stated financial challenges and discussed ongoing care management follow up  Collaboration phone call to the [place] to confirm mattress [employer] are donated, however they do not accept soiled mattresses, mattresses that come from homes of smokers or pets. Mattresses are cleaned but not extensively Provided patient with additional information received by the [facility] open to considering the mattress and will make final decision this afternoon Contact information provided for the [place] for assistance with railing for the stairs leading up to her home [phone] Advised patient to follow up with this  agency as well as Care Guide for possible emergency assistance for her utilities Collaborated with appropriate clinical care team members(CCM [facility]) regarding patient needs Emotional support provided related to patients community resource needs Active listening / Reflection utilized  04/01/21 3 pm  Follow up phone call to discuss decision regarding the mattress-Voicemail left for a return call  Patient Self Care Activities & Deficits:  Patient is unable to independently navigate community resource options without care coordination support  Acknowledges deficits and is motivated to resolve concern with encouragement Patient is able to contact [place]  as discussed today Unable to perform IADLs independently Calls provider office for new concerns or questions  Please see past updates related to this goal by clicking on the "Past Updates" button in the selected goal         PLAN A member of the care  management team will follow up within the next two weeks.   Cristy [facility] Care Management [facility] [phone]

## 2021-04-05 NOTE — Telephone Encounter (Signed)
   Telephone encounter was:  Successful.  04/05/2021 Name: Lisa [name] MRN: [id number] DOB: [birth year]  Lisa [name] is a 75 y.o. year old female who is a primary care patient of Alba Cory, MD . The community resource team was consulted for assistance with  utilities.  Care guide performed the following interventions: Spoke with patient she is going to call [employer] and [employer] to have them email a copy of her statements to me.  I attempted to do a 3 way call earlier today but she was not available and they cannot send the information without her permission. Let patient know the office could scan them and email them if [employer] and [place] cannot.  Follow Up Plan:  Care guide will follow up with patient by phone over the next 7 days.  Lisa [name], AAS Paralegal, [facility] Care Guide  Embedded Care Coordination [place]  Care Management  [address] [place], [place] [address] [email or web address]  ?? [phone]   [email or web address]

## 2021-04-09 ENCOUNTER — Emergency Department: Payer: Medicare Other

## 2021-04-09 ENCOUNTER — Inpatient Hospital Stay
Admission: EM | Admit: 2021-04-09 | Discharge: 2021-04-13 | DRG: 291 | Disposition: A | Discharge status: Home Health | Payer: Medicare Other | Attending: Internal Medicine | Admitting: Internal Medicine

## 2021-04-09 ENCOUNTER — Encounter: Payer: Self-pay | Admitting: Internal Medicine

## 2021-04-09 DIAGNOSIS — Z833 Family history of diabetes mellitus: Secondary | ICD-10-CM | POA: Diagnosis not present

## 2021-04-09 DIAGNOSIS — Z8249 Family history of ischemic heart disease and other diseases of the circulatory system: Secondary | ICD-10-CM

## 2021-04-09 DIAGNOSIS — Z7902 Long term (current) use of antithrombotics/antiplatelets: Secondary | ICD-10-CM | POA: Diagnosis not present

## 2021-04-09 DIAGNOSIS — Z89432 Acquired absence of left foot: Secondary | ICD-10-CM

## 2021-04-09 DIAGNOSIS — Z841 Family history of disorders of kidney and ureter: Secondary | ICD-10-CM

## 2021-04-09 DIAGNOSIS — J811 Chronic pulmonary edema: Secondary | ICD-10-CM | POA: Diagnosis not present

## 2021-04-09 DIAGNOSIS — Z7984 Long term (current) use of oral hypoglycemic drugs: Secondary | ICD-10-CM

## 2021-04-09 DIAGNOSIS — E876 Hypokalemia: Secondary | ICD-10-CM | POA: Diagnosis present

## 2021-04-09 DIAGNOSIS — N183 Chronic kidney disease, stage 3 unspecified: Secondary | ICD-10-CM | POA: Diagnosis present

## 2021-04-09 DIAGNOSIS — Z79899 Other long term (current) drug therapy: Secondary | ICD-10-CM

## 2021-04-09 DIAGNOSIS — Z86718 Personal history of other venous thrombosis and embolism: Secondary | ICD-10-CM | POA: Diagnosis not present

## 2021-04-09 DIAGNOSIS — T502X5A Adverse effect of carbonic-anhydrase inhibitors, benzothiadiazides and other diuretics, initial encounter: Secondary | ICD-10-CM | POA: Diagnosis present

## 2021-04-09 DIAGNOSIS — I130 Hypertensive heart and chronic kidney disease with heart failure and stage 1 through stage 4 chronic kidney disease, or unspecified chronic kidney disease: Principal | ICD-10-CM | POA: Diagnosis present

## 2021-04-09 DIAGNOSIS — I5033 Acute on chronic diastolic (congestive) heart failure: Secondary | ICD-10-CM | POA: Diagnosis present

## 2021-04-09 DIAGNOSIS — R0689 Other abnormalities of breathing: Secondary | ICD-10-CM | POA: Diagnosis not present

## 2021-04-09 DIAGNOSIS — R0902 Hypoxemia: Secondary | ICD-10-CM | POA: Diagnosis not present

## 2021-04-09 DIAGNOSIS — I5032 Chronic diastolic (congestive) heart failure: Secondary | ICD-10-CM | POA: Diagnosis present

## 2021-04-09 DIAGNOSIS — E785 Hyperlipidemia, unspecified: Secondary | ICD-10-CM | POA: Diagnosis present

## 2021-04-09 DIAGNOSIS — Z20822 Contact with and (suspected) exposure to covid-19: Secondary | ICD-10-CM | POA: Diagnosis present

## 2021-04-09 DIAGNOSIS — J9600 Acute respiratory failure, unspecified whether with hypoxia or hypercapnia: Secondary | ICD-10-CM | POA: Diagnosis present

## 2021-04-09 DIAGNOSIS — R059 Cough, unspecified: Secondary | ICD-10-CM | POA: Diagnosis not present

## 2021-04-09 DIAGNOSIS — F17210 Nicotine dependence, cigarettes, uncomplicated: Secondary | ICD-10-CM | POA: Diagnosis present

## 2021-04-09 DIAGNOSIS — E1122 Type 2 diabetes mellitus with diabetic chronic kidney disease: Secondary | ICD-10-CM | POA: Diagnosis present

## 2021-04-09 DIAGNOSIS — I509 Heart failure, unspecified: Secondary | ICD-10-CM

## 2021-04-09 DIAGNOSIS — F330 Major depressive disorder, recurrent, mild: Secondary | ICD-10-CM | POA: Diagnosis present

## 2021-04-09 DIAGNOSIS — F17200 Nicotine dependence, unspecified, uncomplicated: Secondary | ICD-10-CM | POA: Diagnosis present

## 2021-04-09 DIAGNOSIS — J449 Chronic obstructive pulmonary disease, unspecified: Secondary | ICD-10-CM | POA: Diagnosis present

## 2021-04-09 DIAGNOSIS — J9601 Acute respiratory failure with hypoxia: Secondary | ICD-10-CM | POA: Diagnosis present

## 2021-04-09 DIAGNOSIS — E1151 Type 2 diabetes mellitus with diabetic peripheral angiopathy without gangrene: Secondary | ICD-10-CM | POA: Diagnosis present

## 2021-04-09 DIAGNOSIS — I10 Essential (primary) hypertension: Secondary | ICD-10-CM | POA: Diagnosis not present

## 2021-04-09 DIAGNOSIS — I161 Hypertensive emergency: Secondary | ICD-10-CM | POA: Diagnosis present

## 2021-04-09 DIAGNOSIS — H409 Unspecified glaucoma: Secondary | ICD-10-CM | POA: Diagnosis present

## 2021-04-09 DIAGNOSIS — R0602 Shortness of breath: Secondary | ICD-10-CM | POA: Diagnosis not present

## 2021-04-09 DIAGNOSIS — I110 Hypertensive heart disease with heart failure: Secondary | ICD-10-CM | POA: Diagnosis not present

## 2021-04-09 DIAGNOSIS — Z7982 Long term (current) use of aspirin: Secondary | ICD-10-CM

## 2021-04-09 DIAGNOSIS — I5023 Acute on chronic systolic (congestive) heart failure: Secondary | ICD-10-CM | POA: Diagnosis not present

## 2021-04-09 LAB — CBC WITH DIFFERENTIAL/PLATELET
Abs Immature Granulocytes: 0.03 10*3/uL (ref 0.00–0.07)
Basophils Absolute: 0 10*3/uL (ref 0.0–0.1)
Basophils Relative: 0 %
Eosinophils Absolute: 0 10*3/uL (ref 0.0–0.5)
Eosinophils Relative: 0 %
HCT: 34.4 % — ABNORMAL LOW (ref 36.0–46.0)
Hemoglobin: 11.1 g/dL — ABNORMAL LOW (ref 12.0–15.0)
Immature Granulocytes: 0 %
Lymphocytes Relative: 7 %
Lymphs Abs: 0.5 10*3/uL — ABNORMAL LOW (ref 0.7–4.0)
MCH: 28.1 pg (ref 26.0–34.0)
MCHC: 32.3 g/dL (ref 30.0–36.0)
MCV: 87.1 fL (ref 80.0–100.0)
Monocytes Absolute: 0.5 10*3/uL (ref 0.1–1.0)
Monocytes Relative: 7 %
Neutro Abs: 6.1 10*3/uL (ref 1.7–7.7)
Neutrophils Relative %: 86 %
Platelets: 265 10*3/uL (ref 150–400)
RBC: 3.95 MIL/uL (ref 3.87–5.11)
RDW: 18.9 % — ABNORMAL HIGH (ref 11.5–15.5)
WBC: 7.1 10*3/uL (ref 4.0–10.5)
nRBC: 0 % (ref 0.0–0.2)

## 2021-04-09 LAB — RESP PANEL BY RT-PCR (FLU A&B, COVID) ARPGX2
Influenza A by PCR: NEGATIVE
Influenza B by PCR: NEGATIVE
SARS Coronavirus 2 by RT PCR: NEGATIVE

## 2021-04-09 LAB — BASIC METABOLIC PANEL
Anion gap: 9 (ref 5–15)
BUN: 12 mg/dL (ref 8–23)
CO2: 29 mmol/L (ref 22–32)
Calcium: 8.8 mg/dL — ABNORMAL LOW (ref 8.9–10.3)
Chloride: 103 mmol/L (ref 98–111)
Creatinine, Ser: 0.56 mg/dL (ref 0.44–1.00)
GFR, Estimated: >60 mL/min (ref >60)
Glucose, Bld: 166 mg/dL — ABNORMAL HIGH (ref 70–99)
Potassium: 2.4 mmol/L — CL (ref 3.5–5.1)
Sodium: 141 mmol/L (ref 135–145)

## 2021-04-09 LAB — TROPONIN I (HIGH SENSITIVITY)
Troponin I (High Sensitivity): 24 ng/L — ABNORMAL HIGH (ref <18)
Troponin I (High Sensitivity): 26 ng/L — ABNORMAL HIGH (ref <18)
Troponin I (High Sensitivity): 19 ng/L — ABNORMAL HIGH (ref <18)
Troponin I (High Sensitivity): 17 ng/L (ref <18)

## 2021-04-09 LAB — BRAIN NATRIURETIC PEPTIDE: B Natriuretic Peptide: 378.9 pg/mL — ABNORMAL HIGH (ref 0.0–100.0)

## 2021-04-09 LAB — MAGNESIUM: Magnesium: 1.5 mg/dL — ABNORMAL LOW (ref 1.7–2.4)

## 2021-04-09 MED ORDER — PSYLLIUM 95 % PO PACK
1.0000 | PACK | Freq: Every day | ORAL | Status: DC | PRN
  Filled 2021-04-09: qty 1

## 2021-04-09 MED ORDER — ROSUVASTATIN CALCIUM 10 MG PO TABS
10.0000 mg | ORAL_TABLET | Freq: Every day | ORAL | Status: DC
  Administered 2021-04-09 – 2021-04-13 (×5): 10 mg via ORAL
  Filled 2021-04-09 (×6): qty 1

## 2021-04-09 MED ORDER — MAGNESIUM SULFATE 2 GM/50ML IV SOLN
2.0000 g | Freq: Once | INTRAVENOUS | Status: AC
  Administered 2021-04-09: 2 g via INTRAVENOUS
  Filled 2021-04-09: qty 50

## 2021-04-09 MED ORDER — CLOPIDOGREL BISULFATE 75 MG PO TABS
75.0000 mg | ORAL_TABLET | Freq: Every day | ORAL | Status: DC
  Administered 2021-04-09 – 2021-04-13 (×5): 75 mg via ORAL
  Filled 2021-04-09 (×5): qty 1

## 2021-04-09 MED ORDER — BRIMONIDINE TARTRATE 0.2 % OP SOLN
1.0000 [drp] | Freq: Two times a day (BID) | OPHTHALMIC | Status: DC
  Administered 2021-04-09 – 2021-04-13 (×6): 1 [drp] via OPHTHALMIC
  Filled 2021-04-09: qty 5

## 2021-04-09 MED ORDER — VITAMIN D (ERGOCALCIFEROL) 1.25 MG (50000 UNIT) PO CAPS: 50000.0000 [IU] | ORAL_CAPSULE | ORAL | Status: DC

## 2021-04-09 MED ORDER — SODIUM CHLORIDE 0.9 % IV SOLN: 250.0000 mL | INTRAVENOUS | Status: DC | PRN

## 2021-04-09 MED ORDER — POTASSIUM CHLORIDE CRYS ER 20 MEQ PO TBCR
40.0000 meq | EXTENDED_RELEASE_TABLET | Freq: Two times a day (BID) | ORAL | Status: AC
  Administered 2021-04-09 (×2): 40 meq via ORAL
  Filled 2021-04-09 (×2): qty 2

## 2021-04-09 MED ORDER — ACETAMINOPHEN 650 MG RE SUPP
650.0000 mg | Freq: Four times a day (QID) | RECTAL | Status: DC | PRN
  Filled 2021-04-09: qty 1

## 2021-04-09 MED ORDER — SODIUM CHLORIDE 0.9% FLUSH
3.0000 mL | Freq: Two times a day (BID) | INTRAVENOUS | Status: DC
  Administered 2021-04-09 – 2021-04-12 (×7): 3 mL via INTRAVENOUS

## 2021-04-09 MED ORDER — FUROSEMIDE 10 MG/ML IJ SOLN
20.0000 mg | Freq: Once | INTRAMUSCULAR | Status: AC
  Administered 2021-04-09: 20 mg via INTRAVENOUS
  Filled 2021-04-09: qty 4

## 2021-04-09 MED ORDER — FUROSEMIDE 10 MG/ML IJ SOLN: 40.0000 mg | Freq: Every day | INTRAMUSCULAR | Status: DC

## 2021-04-09 MED ORDER — ACETAMINOPHEN 325 MG PO TABS: 650.0000 mg | ORAL_TABLET | Freq: Four times a day (QID) | ORAL | Status: DC | PRN

## 2021-04-09 MED ORDER — LOSARTAN POTASSIUM 50 MG PO TABS
100.0000 mg | ORAL_TABLET | Freq: Every day | ORAL | Status: DC
  Administered 2021-04-09 – 2021-04-13 (×5): 100 mg via ORAL
  Filled 2021-04-09 (×5): qty 2

## 2021-04-09 MED ORDER — ENOXAPARIN SODIUM 40 MG/0.4ML IJ SOSY
40.0000 mg | PREFILLED_SYRINGE | INTRAMUSCULAR | Status: DC
  Administered 2021-04-10 – 2021-04-12 (×3): 40 mg via SUBCUTANEOUS
  Filled 2021-04-09 (×4): qty 0.4

## 2021-04-09 MED ORDER — SODIUM CHLORIDE 0.9% FLUSH: 3.0000 mL | INTRAVENOUS | Status: DC | PRN

## 2021-04-09 MED ORDER — POTASSIUM CHLORIDE 10 MEQ/100ML IV SOLN
10.0000 meq | INTRAVENOUS | Status: AC
Start: 2021-04-09 — End: 2021-04-09
  Administered 2021-04-09 (×2): 10 meq via INTRAVENOUS
  Filled 2021-04-09: qty 100

## 2021-04-09 MED ORDER — DORZOLAMIDE HCL-TIMOLOL MAL 2-0.5 % OP SOLN
1.0000 [drp] | Freq: Two times a day (BID) | OPHTHALMIC | Status: DC
  Administered 2021-04-09 – 2021-04-13 (×6): 1 [drp] via OPHTHALMIC
  Filled 2021-04-09: qty 10

## 2021-04-09 MED ORDER — ATROPINE SULFATE 1 % OP SOLN
1.0000 [drp] | Freq: Three times a day (TID) | OPHTHALMIC | Status: DC
  Administered 2021-04-09 – 2021-04-13 (×8): 1 [drp] via OPHTHALMIC
  Filled 2021-04-09: qty 2

## 2021-04-09 MED ORDER — METOPROLOL SUCCINATE ER 50 MG PO TB24
25.0000 mg | ORAL_TABLET | Freq: Every day | ORAL | Status: DC
  Administered 2021-04-09 – 2021-04-10 (×2): 25 mg via ORAL
  Filled 2021-04-09 (×2): qty 1

## 2021-04-09 MED ORDER — NITROGLYCERIN IN D5W 200-5 MCG/ML-% IV SOLN
0.0000 ug/min | INTRAVENOUS | Status: DC
Start: 2021-04-09 — End: 2021-04-12
  Administered 2021-04-09: 5 ug/min via INTRAVENOUS
  Administered 2021-04-09: 90 ug/min via INTRAVENOUS
  Administered 2021-04-10: 105 ug/min via INTRAVENOUS
  Filled 2021-04-09 (×5): qty 250

## 2021-04-09 MED ORDER — PREDNISOLONE ACETATE 1 % OP SUSP
1.0000 [drp] | Freq: Four times a day (QID) | OPHTHALMIC | Status: DC
  Administered 2021-04-09 – 2021-04-13 (×8): 1 [drp] via OPHTHALMIC
  Filled 2021-04-09 (×4): qty 5

## 2021-04-09 MED ORDER — ADULT MULTIVITAMIN W/MINERALS CH
1.0000 | ORAL_TABLET | Freq: Every day | ORAL | Status: DC
  Administered 2021-04-09 – 2021-04-13 (×5): 1 via ORAL
  Filled 2021-04-09 (×5): qty 1

## 2021-04-09 MED ORDER — POTASSIUM CHLORIDE CRYS ER 20 MEQ PO TBCR
40.0000 meq | EXTENDED_RELEASE_TABLET | Freq: Once | ORAL | Status: AC
  Administered 2021-04-09: 40 meq via ORAL
  Filled 2021-04-09: qty 2

## 2021-04-09 MED ORDER — ASPIRIN EC 81 MG PO TBEC
81.0000 mg | DELAYED_RELEASE_TABLET | Freq: Every day | ORAL | Status: DC
  Administered 2021-04-10 – 2021-04-13 (×4): 81 mg via ORAL
  Filled 2021-04-09 (×4): qty 1

## 2021-04-09 MED ORDER — CITALOPRAM HYDROBROMIDE 20 MG PO TABS
20.0000 mg | ORAL_TABLET | Freq: Every day | ORAL | Status: DC
  Administered 2021-04-09 – 2021-04-13 (×5): 20 mg via ORAL
  Filled 2021-04-09 (×5): qty 1

## 2021-04-09 MED ORDER — VITAMIN B-12 1000 MCG PO TABS
1000.0000 ug | ORAL_TABLET | Freq: Every day | ORAL | Status: DC
  Administered 2021-04-09 – 2021-04-13 (×5): 1000 ug via ORAL
  Filled 2021-04-09 (×6): qty 1

## 2021-04-09 NOTE — ED Notes (Signed)
Breakfast tray given. °

## 2021-04-09 NOTE — ED Notes (Signed)
Pt repositioned in the bed at this time. Pt still clean and dry. Lunch meal given.

## 2021-04-09 NOTE — Progress Notes (Signed)
Pt taken off bipap and placed on 5L [place], sats 95%, tolerating well at this time. RN aware.

## 2021-04-09 NOTE — ED Notes (Signed)
Dr. Agbata, MD at bedside.  

## 2021-04-09 NOTE — ED Notes (Signed)
Phone given to pt to call family 

## 2021-04-09 NOTE — ED Notes (Signed)
Wall canister emptied 800 ml.

## 2021-04-09 NOTE — ED Notes (Signed)
Per RT, pt is taken off Bipap and placed on 5L [place] at this time, pt maintained 99% on 5L

## 2021-04-09 NOTE — H&P (Addendum)
History and Physical    Lisa [name] DVV:[id number] DOB: 01-08-1946 DOA: 04/09/2021  PCP: Steele Sizer, MD   Patient coming from: Home  I have personally briefly reviewed patient's old medical records in [place]  Chief Complaint: Shortness of breath  HPI: Lisa [name] is a 75 y.o. female with medical history significant for depression, hypertension, diabetes mellitus, glaucoma, chronic diastolic dysfunction CHF who presents to the ER via EMS for evaluation of sudden onset shortness of breath and cough. Patient states that she was in her usual state of health until yesterday evening when she developed shortness of breath and a headache which she rarely gets.  Over the course of the evening she started wheezing and then was unable to lay flat in bed due to worsening shortness of breath so she called EMS. She has had bilateral lower extremity. She denied having any chest pain, no fever, no chills, no cough, no changes in her bowel habits, no abdominal pain, no diarrhea, no lightheadedness, no focal deficit or blurred vision. Per EMS, patient was hypoxic and extremely hypertensive.  She received a spray of nitroglycerin and was placed on 2 L of oxygen and transported to the ER. Labs show sodium 141, potassium 2.4, chloride 103, bicarb 29, glucose 166, BUN 12, creatinine 0.56, calcium 8.8, magnesium 1.5, BNP 378, troponin 26, white count 7.1, hemoglobin 11.1, hematocrit 34.4, MCV 87.1, RDW 18.9, platelet count 265, glucose 166 Respiratory viral panel is negative Chest x-ray reviewed by me was consistent with bilateral pulmonary edema. Twelve-lead EKG reviewed by me shows sinus tachycardia with PVCs.   ED Course: Patient is a 74 year old female who presents to the ER via EMS for evaluation of worsening shortness of breath associated with orthopnea and lower extremity swelling. She was hypoxic and had increased work of breathing in the field and was initially placed on 2 L of oxygen  but was then transitioned to BiPAP to reduce work of breathing. Blood pressure was significantly elevated and she was started on a nitroglycerin drip. She also received a dose of Lasix and will be admitted to the hospital for further evaluation   Review of Systems: As per HPI otherwise all other systems reviewed and negative.    Past Medical History:  Diagnosis Date   Blindness of right eye    Depression    Diabetes mellitus without complication ([place])    DVT of leg (deep venous thrombosis) ([place])    Glaucoma    Hyperlipidemia    Hypertension    Vascular disease     Past Surgical History:  Procedure Laterality Date   ABDOMINAL HYSTERECTOMY  1992   Total   EYE SURGERY     TOE AMPUTATION Left 2011   All of her toes on the left foot   VASCULAR SURGERY       reports that she has been smoking cigarettes. She started smoking about 21 years ago. She has a 10.00 pack-year smoking history. She has never used smokeless tobacco. She reports that she does not drink alcohol and does not use drugs.  Allergies  Allergen Reactions   Other Itching    Nucinta   Tapentadol Hcl     Family History  Problem Relation Age of Onset   Heart disease Father    Diabetes Mother    Diabetes Brother    Kidney disease Brother        Transplant   Heart disease Brother    Diabetes Sister    Diabetes Sister  Prior to Admission medications   Medication Sig Start Date End Date Taking? Authorizing Provider  amLODipine (NORVASC) 2.5 MG tablet Take 1 tablet (2.5 mg total) by mouth daily. 03/19/21   Myles Gip, DO  aspirin 81 MG tablet Take 81 mg by mouth daily.    [provider]  atropine 1 % ophthalmic solution Place 1 drop into the right eye 3 (three) times daily.    [provider]  blood glucose meter kit and supplies Dispense based on patient and insurance preference. Use up to four times daily as directed. (FOR ICD-10 E10.9, E11.9). 03/19/21   Myles Gip, DO   Blood Glucose Monitoring Suppl (ACCU-CHEK AVIVA CONNECT) w/Device KIT 1 each by Does not apply route daily. And supplies E11.9 check fsbs bid 07/20/18   Steele Sizer, MD  brimonidine (ALPHAGAN) 0.2 % ophthalmic solution Place 1 drop into both eyes 2 (two) times daily.    [provider]  citalopram (CELEXA) 20 MG tablet Take 1 tablet (20 mg total) by mouth daily. 02/20/21   Steele Sizer, MD  clopidogrel (PLAVIX) 75 MG tablet Take 1 tablet (75 mg total) by mouth daily. 02/20/21   Steele Sizer, MD  Cyanocobalamin (B-12) 1000 MCG SUBL Place 1 tablet under the tongue daily. 03/19/21   Myles Gip, DO  dorzolamide-timolol (COSOPT) 22.3-6.8 MG/ML ophthalmic solution SMARTSIG:In Eye(s) 03/02/21   [provider]  JANUVIA 100 MG tablet TAKE 1 TABLET BY MOUTH EVERY DAY 03/01/21   Ancil Boozer, Drue Stager, MD  losartan (COZAAR) 100 MG tablet Take 1 tablet (100 mg total) by mouth daily. 02/20/21   Steele Sizer, MD  Multiple Vitamin (MULTIVITAMIN) tablet Take 1 tablet by mouth daily.    [provider]  pioglitazone (ACTOS) 15 MG tablet Take 1 tablet (15 mg total) by mouth daily. 02/20/21   Steele Sizer, MD  potassium chloride (KLOR-CON) 10 MEQ tablet Take 1 tablet (10 mEq total) by mouth 2 (two) times daily for 5 days. 03/20/21 03/25/21  Myles Gip, DO  prednisoLONE acetate (PRED FORTE) 1 % ophthalmic suspension Place into the right eye. 03/07/21   [provider]  psyllium (METAMUCIL) 0.52 g capsule Take 1 capsule (0.52 g total) by mouth daily. 03/19/21   Myles Gip, DO  rosuvastatin (CRESTOR) 10 MG tablet Take 1 tablet (10 mg total) by mouth daily. 02/20/21   Steele Sizer, MD  Vitamin D, Ergocalciferol, (DRISDOL) 1.25 MG (50000 UNIT) CAPS capsule TAKE 1 CAPSULE (50,000 UNITS TOTAL) BY MOUTH EVERY 7 (SEVEN) DAYS. 11/16/20   Steele Sizer, MD    Physical Exam: Vitals:   04/09/21 0930 04/09/21 1000 04/09/21 1030 04/09/21 1040  BP: (!) 165/80 (!)  162/82 (!) 162/86 (!) 155/93  Pulse: 77 77 71 77  Resp: $Remo'17 16 19 17  'naiJY$ Temp:      TempSrc:      SpO2: 100% 100% 100% 99%  Weight:      Height:         Vitals:   04/09/21 0930 04/09/21 1000 04/09/21 1030 04/09/21 1040  BP: (!) 165/80 (!) 162/82 (!) 162/86 (!) 155/93  Pulse: 77 77 71 77  Resp: $Remo'17 16 19 17  'JuhyM$ Temp:      TempSrc:      SpO2: 100% 100% 100% 99%  Weight:      Height:          Constitutional: Alert and oriented x 3 . Not in any apparent distress.  Currently on nasal cannula, off BiPAP HEENT:  Head: Normocephalic and atraumatic.         Eyes: PERLA, EOMI, Conjunctivae are normal. Sclera is non-icteric.       Mouth/Throat: Mucous membranes are moist.       Neck: Supple with no signs of meningismus. Cardiovascular: Regular rate and rhythm. No murmurs, gallops, or rubs. 2+ symmetrical distal pulses are present . No JVD. 2+ LE edema Respiratory: Respiratory effort normal .crackles in both lung fields bilaterally. No wheezes or rhonchi.  Gastrointestinal: Soft, non tender, and non distended with positive bowel sounds.  Genitourinary: No CVA tenderness. Musculoskeletal: Nontender with normal range of motion in all extremities. No cyanosis, or erythema of extremities. Neurologic:  Face is symmetric. Moving all extremities. No gross focal neurologic deficits . Skin: Skin is warm, dry.  No rash or ulcers Psychiatric: Depressed mood, flat affect   Labs on Admission: I have personally reviewed following labs and imaging studies  CBC: Recent Labs  Lab 04/09/21 0336  WBC 7.1  NEUTROABS 6.1  HGB 11.1*  HCT 34.4*  MCV 87.1  PLT 409   Basic Metabolic Panel: Recent Labs  Lab 04/09/21 0336 04/09/21 0550  NA 141  --   K 2.4*  --   CL 103  --   CO2 29  --   GLUCOSE 166*  --   BUN 12  --   CREATININE 0.56  --   CALCIUM 8.8*  --   MG  --  1.5*   GFR: Estimated Creatinine Clearance: 52.5 mL/min (by C-G formula based on SCr of 0.56 mg/dL). Liver Function  Tests: No results for input(s): AST, ALT, ALKPHOS, BILITOT, PROT, ALBUMIN in the last 168 hours. No results for input(s): LIPASE, AMYLASE in the last 168 hours. No results for input(s): AMMONIA in the last 168 hours. Coagulation Profile: No results for input(s): INR, PROTIME in the last 168 hours. Cardiac Enzymes: No results for input(s): CKTOTAL, CKMB, CKMBINDEX, TROPONINI in the last 168 hours. BNP (last 3 results) No results for input(s): PROBNP in the last 8760 hours. HbA1C: No results for input(s): HGBA1C in the last 72 hours. CBG: No results for input(s): GLUCAP in the last 168 hours. Lipid Profile: No results for input(s): CHOL, HDL, LDLCALC, TRIG, CHOLHDL, LDLDIRECT in the last 72 hours. Thyroid Function Tests: No results for input(s): TSH, T4TOTAL, FREET4, T3FREE, THYROIDAB in the last 72 hours. Anemia Panel: No results for input(s): VITAMINB12, FOLATE, FERRITIN, TIBC, IRON, RETICCTPCT in the last 72 hours. Urine analysis:    Component Value Date/Time   COLORURINE AMBER (A) 02/25/2021 2119   APPEARANCEUR HAZY (A) 02/25/2021 2119   APPEARANCEUR Clear 04/07/2018 1319   LABSPEC 1.017 02/25/2021 2119   PHURINE 5.0 02/25/2021 2119   GLUCOSEU 50 (A) 02/25/2021 2119   HGBUR NEGATIVE 02/25/2021 2119   BILIRUBINUR NEGATIVE 02/25/2021 2119   BILIRUBINUR Negative 09/24/2020 1615   BILIRUBINUR Negative 04/07/2018 1319   KETONESUR 5 (A) 02/25/2021 2119   PROTEINUR 30 (A) 02/25/2021 2119   UROBILINOGEN 0.2 09/24/2020 1615   NITRITE NEGATIVE 02/25/2021 2119   LEUKOCYTESUR TRACE (A) 02/25/2021 2119    Radiological Exams on Admission: DG Chest Portable 1 View  Result Date: 04/09/2021 CLINICAL DATA:  Shortness of breath and cough EXAM: PORTABLE CHEST 1 VIEW COMPARISON:  02/26/2021 FINDINGS: Cardiac shadow is enlarged but stable. Aortic calcifications are noted. Diffuse increased airspace opacity is noted consistent with worsening edema. No sizable effusion is seen. No confluent  infiltrate is noted. No bony abnormalities are seen. IMPRESSION: Bilateral pulmonary edema. Electronically  Signed   By: Inez Catalina M.D.   On: 04/09/2021 03:27     Assessment/Plan Principal Problem:   Acute on chronic diastolic CHF (congestive heart failure) (HCC) Active Problems:   Benign essential HTN   Depression, major, recurrent, mild (HCC)   DM (diabetes mellitus), type 2 with peripheral vascular complications (HCC)   Hypokalemia   Hypomagnesemia   Hypertensive emergency   Acute respiratory failure ([place])   Nicotine dependence      Patient is a 75 year old female admitted to the hospital for hypertensive emergency, acute on chronic diastolic dysfunction CHF and acute respiratory failure     Acute on chronic diastolic dysfunction CHF Secondary to hypertensive heart disease Patient's last 2D echocardiogram from 10/22 shows an LVEF of 60 to 65% and grade 2 diastolic dysfunction. Place patient on Lasix 40 mg IV daily Optimize blood pressure control Continue losartan and metoprolol Maintain low-sodium diet    Acute respiratory failure Secondary to hypertensive emergency function CHF Patient was hypoxic upon arrival to the ER tachypnea, increased work of breathing and use of accessory muscles and had to be placed on BiPAP to reduce work of breathing  Will attempt to wean patient off BiPAP as tolerated     Hypertensive emergency Continue nitroglycerin drip Resume losartan and start patient on metoprolol Will uptitrate oral medications to optimize blood pressure control    Diabetes mellitus with complications of stage III chronic kidney disease Hold Actos and Januvia Glycemic control with sliding scale insulin Maintain consistent carbohydrate diet    Hypokalemia/hypomagnesemia Secondary to diuretic use Supplement potassium and magnesium    Depression Continue Celexa    Nicotine dependence Patient smokes about 3 cigarettes daily Smoking cessation was  discussed with her in detail She declines a nicotine transdermal patch at this time.  DVT prophylaxis: Lovenox  Code Status: full code  Family Communication: Greater than 50% of time was spent discussing patient's condition and plan of care with her at the bedside.  All questions and concerns have been addressed.  She verbalizes understanding and agrees with plan.  She lists her brother Esmond Camper as her healthcare power of attorney.  CODE STATUS was discussed and she is a full code. Disposition Plan: Back to previous home environment Consults called: Cardiology Status:At the time of admission, it appears that the appropriate admission status for this patient is inpatient. This is judged to be reasonable and necessary to provide the required intensity of service to ensure the patient's safety given the presenting symptoms, physical exam findings, and initial radiographic and laboratory data in the context of their comorbid conditions. Patient requires inpatient status due to high intensity of service, high risk for further deterioration and high frequency of surveillance required.     Collier Bullock MD [facility]     04/09/2021, 11:04 AM

## 2021-04-09 NOTE — ED Notes (Signed)
MD Don Perking notified of Critical Potassium of 2.4

## 2021-04-09 NOTE — Consult Note (Signed)
CARDIOLOGY CONSULT NOTE               [name] MRN: [id number] DOB/AGE: 75-20-1947 75 y.o.  Admit date: 04/09/2021 Referring Physician Dr. Dartha Lodge hospitalist Primary Physician Dr. Carlynn Purl primary Primary Cardiologist Dr Beatrix Fetters Reason for Consultation pulmonary edema congestive heart failure  HPI: Patient [age] year old female multiple medical problems hypertension diabetes hyperlipidemia chronic diastolic congestive heart failure COPD shortness of breath smoking peripheral vascular disease now with respiratory failure hypoxemia.  Patient was recently seen and evaluated about a month ago for similar presentation.  Patient has not had as best I can tell ischemia work-up but she is also not complaining of any chest pain she is mostly had dyspnea shortness of breath type symptoms echocardiogram a month ago showed preserved left ventricular function with diastolic failure she became acutely dyspneic has had PND orthopnea so came to the emergency room requiring BiPAP supplemental oxygen and significant elevated blood pressure is not clear if she has been compliant with her medications or treatment  Review of systems complete and found to be negative unless listed above     Past Medical History:  Diagnosis Date   Blindness of right eye    Depression    Diabetes mellitus without complication (HCC)    DVT of leg (deep venous thrombosis) (HCC)    Glaucoma    Hyperlipidemia    Hypertension    Vascular disease     Past Surgical History:  Procedure Laterality Date   ABDOMINAL HYSTERECTOMY  1992   Total   EYE SURGERY     TOE AMPUTATION Left 2011   All of her toes on the left foot   VASCULAR SURGERY      (Not in a hospital admission)  Social History   Socioeconomic History   Marital status: Widowed    Spouse name: Not on file   Number of children: 0   Years of education: Not on file   Highest education level: Some college, no degree  Occupational History    Occupation: Retired  Tobacco Use   Smoking status: Every Day    Packs/day: 0.25    Years: 40.00    Pack years: 10.00    Types: Cigarettes    Start date: 03/16/2000   Smokeless tobacco: Never  Vaping Use   Vaping Use: Never used  Substance and Sexual Activity   Alcohol use: No    Alcohol/week: 0.0 standard drinks   Drug use: No   Sexual activity: Not Currently  Other Topics Concern   Not on file  Social History Narrative   Not on file   Social Determinants of Health   Financial Resource Strain: High Risk   Difficulty of Paying Living Expenses: Hard  Food Insecurity: Food Insecurity Present   Worried About Running Out of Food in the Last Year: Sometimes true   Ran Out of Food in the Last Year: Sometimes true  Transportation Needs: No Transportation Needs   Lack of Transportation (Medical): No   Lack of Transportation (Non-Medical): No  Physical Activity: Insufficiently Active   Days of Exercise per Week: 3 days   Minutes of Exercise per Session: 20 min  Stress: No Stress Concern Present   Feeling of Stress : Only a little  Social Connections: Moderately Isolated   Frequency of Communication with Friends and Family: More than three times a week   Frequency of Social Gatherings with Friends and Family: Once a week   Attends Religious Services: More than 4  times per year   Active Member of Clubs or Organizations: No   Attends Banker Meetings: Never   Marital Status: Widowed  Catering manager Violence: Not on file    Family History  Problem Relation Age of Onset   Heart disease Father    Diabetes Mother    Diabetes Brother    Kidney disease Brother        Transplant   Heart disease Brother    Diabetes Sister    Diabetes Sister       Review of systems complete and found to be negative unless listed above      PHYSICAL EXAM  General: Well developed, well nourished, in no acute distress mild respiratory distress HEENT:  Normocephalic and  atramatic exophthalmos Neck:  No JVD.  Lungs: Clear bilaterally to auscultation and percussion. Heart: HRRR . Normal S1 and S2 without gallops or murmurs.  Abdomen: Bowel sounds are positive, abdomen soft and non-tender  Msk:  Back normal, normal gait. Normal strength and tone for age. Extremities: No clubbing, cyanosis or 2+edema.   Neuro: Alert and oriented X 3. Psych:  Good affect, responds appropriately  Labs:   Lab Results  Component Value Date   WBC 7.1 04/09/2021   HGB 11.1 (L) 04/09/2021   HCT 34.4 (L) 04/09/2021   MCV 87.1 04/09/2021   PLT 265 04/09/2021    Recent Labs  Lab 04/09/21 0336  NA 141  K 2.4*  CL 103  CO2 29  BUN 12  CREATININE 0.56  CALCIUM 8.8*  GLUCOSE 166*   No results found for: CKTOTAL, CKMB, CKMBINDEX, TROPONINI  Lab Results  Component Value Date   CHOL 108 02/20/2021   CHOL 151 02/17/2020   CHOL 144 02/15/2019   Lab Results  Component Value Date   HDL 57 02/20/2021   HDL 70 02/17/2020   HDL 65 02/15/2019   Lab Results  Component Value Date   LDLCALC 33 02/20/2021   LDLCALC 65 02/17/2020   LDLCALC 63 02/15/2019   Lab Results  Component Value Date   TRIG 96 02/20/2021   TRIG 77 02/17/2020   TRIG 82 02/15/2019   Lab Results  Component Value Date   CHOLHDL 1.9 02/20/2021   CHOLHDL 2.2 02/17/2020   CHOLHDL 2.2 02/15/2019   No results found for: LDLDIRECT    Radiology: DG Chest Portable 1 View  Result Date: 04/09/2021 CLINICAL DATA:  Shortness of breath and cough EXAM: PORTABLE CHEST 1 VIEW COMPARISON:  02/26/2021 FINDINGS: Cardiac shadow is enlarged but stable. Aortic calcifications are noted. Diffuse increased airspace opacity is noted consistent with worsening edema. No sizable effusion is seen. No confluent infiltrate is noted. No bony abnormalities are seen. IMPRESSION: Bilateral pulmonary edema. Electronically Signed   By: Alcide Clever M.D.   On: 04/09/2021 03:27    EKG: Normal sinus rhythm nonspecific T2  changes  ASSESSMENT AND PLAN:  Pulmonary edema Congestive heart failure diastolic dysfunction Shortness of breath COPD Smoking Hypertension Hyperlipidemia Diabetes Peripheral vascular disease Lower extremity edema Hypokalemia . Plan Reviewed with admit agree with respiratory support with BiPAP as necessary wean when appropriate Agree with supplemental oxygen therapy for shortness of breath As patient refrain from tobacco abuse Commend stricter blood pressure control Increase metoprolol consider adding spironolactone consider adding an ARB or ACE Continue IV diuretic management and control Agree with echocardiogram for assessment left ventricular function with known history of diastolic dysfunction I would strongly recommend treatment for probable COPD history of smoking which may  be contributing to her shortness of breath Recommend continue diabetes management and control High-dose statin therapy Recommend aspirin and probably Plavix long-term Consider ischemic work-up inpatient or out as an etiology of her acute pulmonary edema  Signed: Alwyn Pea MD 04/09/2021, 8:09 PM

## 2021-04-09 NOTE — ED Provider Notes (Signed)
[facility] Emergency Department Provider Note  ____________________________________________  Time seen: Approximately 3:18 AM  I have reviewed the triage vital signs and the nursing notes.   HISTORY  Chief Complaint Shortness of Breath   HPI Lisa [name] is a 75 y.o. female with a history of diabetes, DVT, PAD on Plavix, hypertension, hyperlipidemia, diastolic CHF who presents for evaluation of shortness of breath.  Patient reports that her symptoms started yesterday with mild shortness of breath with became more pronounced and more severe throughout the last 24 hours.  She has noticed bilateral leg swelling all the way to her thighs.  She denies chest pain.  She has a cough productive of clear/white phlegm.  She denies wheezing, fever, chills.  She reports orthopnea and worsening shortness of breath with minimal exertion.  Patient was hypoxic per EMS and extremely hypertensive.  Received a spray of nitro and was transported on 2 L nasal cannula  Past Medical History:  Diagnosis Date   Blindness of right eye    Depression    Diabetes mellitus without complication (HCC)    DVT of leg (deep venous thrombosis) (HCC)    Glaucoma    Hyperlipidemia    Hypertension    Vascular disease     Patient Active Problem List   Diagnosis Date Noted   AKI (acute kidney injury) (HCC) 02/26/2021   Hypokalemia 02/26/2021   Hypomagnesemia 02/26/2021   Bruising 02/26/2021   Syncope and collapse 02/26/2021   Prolonged QT interval 02/26/2021   Uncontrolled type 2 diabetes with eye complications 01/29/2016   DM (diabetes mellitus), type 2 with peripheral vascular complications (HCC) 09/25/2015   Amputation of left foot (HCC) 12/02/2014   Benign essential HTN 12/02/2014   Depression, major, recurrent, mild (HCC) 12/02/2014   Dyslipidemia 12/02/2014   Vitamin B12 deficiency 12/02/2014   Peripheral neuropathic pain 12/02/2014   Peripheral artery disease (HCC) 12/02/2014    Phantom limb (HCC) 12/02/2014   Diabetes mellitus with nephropathy (HCC) 12/02/2014   Detached retina 12/02/2014   Type 2 diabetes mellitus with peripheral neuropathy (HCC) 12/02/2014   Tobacco use 12/02/2014   Urge incontinence 12/02/2014   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   Vitamin D deficiency 12/02/2014    Past Surgical History:  Procedure Laterality Date   ABDOMINAL HYSTERECTOMY  1992   Total   EYE SURGERY     TOE AMPUTATION Left 2011   All of her toes on the left foot   VASCULAR SURGERY      Prior to Admission medications   Medication Sig Start Date End Date Taking? Authorizing Provider  amLODipine (NORVASC) 2.5 MG tablet Take 1 tablet (2.5 mg total) by mouth daily. 03/19/21   Caro Laroche, DO  aspirin 81 MG tablet Take 81 mg by mouth daily.    [provider]  atropine 1 % ophthalmic solution Place 1 drop into the right eye 3 (three) times daily.    [provider]  blood glucose meter kit and supplies Dispense based on patient and insurance preference. Use up to four times daily as directed. (FOR ICD-10 E10.9, E11.9). 03/19/21   Caro Laroche, DO  Blood Glucose Monitoring Suppl (ACCU-CHEK AVIVA CONNECT) w/Device KIT 1 each by Does not apply route daily. And supplies E11.9 check fsbs bid 07/20/18   Alba Cory, MD  brimonidine (ALPHAGAN) 0.2 % ophthalmic solution Place 1 drop into both eyes 2 (two) times daily.    [provider]  citalopram (CELEXA) 20 MG tablet Take  1 tablet (20 mg total) by mouth daily. 02/20/21   Steele Sizer, MD  clopidogrel (PLAVIX) 75 MG tablet Take 1 tablet (75 mg total) by mouth daily. 02/20/21   Steele Sizer, MD  Cyanocobalamin (B-12) 1000 MCG SUBL Place 1 tablet under the tongue daily. 03/19/21   Myles Gip, DO  dorzolamide-timolol (COSOPT) 22.3-6.8 MG/ML ophthalmic solution SMARTSIG:In Eye(s) 03/02/21   [provider]  JANUVIA 100 MG tablet TAKE 1 TABLET BY MOUTH EVERY DAY 03/01/21   Ancil Boozer,  Drue Stager, MD  losartan (COZAAR) 100 MG tablet Take 1 tablet (100 mg total) by mouth daily. 02/20/21   Steele Sizer, MD  Multiple Vitamin (MULTIVITAMIN) tablet Take 1 tablet by mouth daily.    [provider]  pioglitazone (ACTOS) 15 MG tablet Take 1 tablet (15 mg total) by mouth daily. 02/20/21   Steele Sizer, MD  potassium chloride (KLOR-CON) 10 MEQ tablet Take 1 tablet (10 mEq total) by mouth 2 (two) times daily for 5 days. 03/20/21 03/25/21  Myles Gip, DO  prednisoLONE acetate (PRED FORTE) 1 % ophthalmic suspension Place into the right eye. 03/07/21   [provider]  psyllium (METAMUCIL) 0.52 g capsule Take 1 capsule (0.52 g total) by mouth daily. 03/19/21   Myles Gip, DO  rosuvastatin (CRESTOR) 10 MG tablet Take 1 tablet (10 mg total) by mouth daily. 02/20/21   Steele Sizer, MD  Vitamin D, Ergocalciferol, (DRISDOL) 1.25 MG (50000 UNIT) CAPS capsule TAKE 1 CAPSULE (50,000 UNITS TOTAL) BY MOUTH EVERY 7 (SEVEN) DAYS. 11/16/20   Steele Sizer, MD    Allergies Other and Tapentadol hcl  Family History  Problem Relation Age of Onset   Heart disease Father    Diabetes Mother    Diabetes Brother    Kidney disease Brother        Transplant   Heart disease Brother    Diabetes Sister    Diabetes Sister     Social History Social History   Tobacco Use   Smoking status: Every Day    Packs/day: 0.25    Years: 40.00    Pack years: 10.00    Types: Cigarettes    Start date: 03/16/2000   Smokeless tobacco: Never  Vaping Use   Vaping Use: Never used  Substance Use Topics   Alcohol use: No    Alcohol/week: 0.0 standard drinks   Drug use: No    Review of Systems  Constitutional: Negative for fever. Eyes: Negative for visual changes. ENT: Negative for sore throat. Neck: No neck pain  Cardiovascular: Negative for chest pain. + orthopnea Respiratory: + shortness of breath, cough Gastrointestinal: Negative for abdominal pain, vomiting or  diarrhea. Genitourinary: Negative for dysuria. Musculoskeletal: Negative for back pain. + b/l leg swelling Skin: Negative for rash. Neurological: Negative for headaches, weakness or numbness. Psych: No SI or HI  ____________________________________________   PHYSICAL EXAM:  VITAL SIGNS: ED Triage Vitals  Enc Vitals Group     BP 04/09/21 0305 (!) 197/160     Pulse --      Resp 04/09/21 0305 19     Temp 04/09/21 0305 98.6 F (37 C)     Temp Source 04/09/21 0305 Oral     SpO2 04/09/21 0305 91 %     Weight 04/09/21 0305 141 lb 1.5 oz (64 kg)     Height 04/09/21 0305 $RemoveBefor'5\' 4"'[id number]$  (1.626 m)     Head Circumference --      Peak Flow --  Pain Score 04/09/21 0306 10     Pain Loc --      Pain Edu? --      Excl. in [place]? --     Constitutional: Alert and oriented, moderate respiratory distress HEENT:      Head: Normocephalic and atraumatic.         Eyes: Conjunctivae are normal. Sclera is non-icteric.       Mouth/Throat: Mucous membranes are moist.       Neck: Supple with no signs of meningismus. Cardiovascular: Regular rate and rhythm. No murmurs, gallops, or rubs. 2+ symmetrical distal pulses are present in all extremities.  Elevated JVD Respiratory: Tachypneic with retractions, speaking in 4 word sentences, crackles bilaterally, hypoxic and satting in the low 90s on 2 L nasal cannula Gastrointestinal: Soft, non tender, and non distended with positive bowel sounds. No rebound or guarding. Genitourinary: No CVA tenderness. Musculoskeletal: 2+ pitting edema bilaterally Neurologic: Normal speech and language. Face is symmetric. Moving all extremities. No gross focal neurologic deficits are appreciated. Skin: Skin is warm, dry and intact. No rash noted. Psychiatric: Mood and affect are normal. Speech and behavior are normal.  ____________________________________________   LABS (all labs ordered are listed, but only abnormal results are displayed)  Labs Reviewed  CBC WITH  DIFFERENTIAL/PLATELET - Abnormal; Notable for the following components:      Result Value   Hemoglobin 11.1 (*)    HCT 34.4 (*)    RDW 18.9 (*)    Lymphs Abs 0.5 (*)    All other components within normal limits  BASIC METABOLIC PANEL - Abnormal; Notable for the following components:   Potassium 2.4 (*)    Glucose, Bld 166 (*)    Calcium 8.8 (*)    All other components within normal limits  BRAIN NATRIURETIC PEPTIDE - Abnormal; Notable for the following components:   B Natriuretic Peptide 378.9 (*)    All other components within normal limits  TROPONIN I (HIGH SENSITIVITY) - Abnormal; Notable for the following components:   Troponin I (High Sensitivity) 24 (*)    All other components within normal limits  RESP PANEL BY RT-PCR (FLU A&B, COVID) ARPGX2  MAGNESIUM  TROPONIN I (HIGH SENSITIVITY)   ____________________________________________  EKG  ED ECG REPORT I, Rudene Re, the attending physician, personally viewed and interpreted this ECG.  Sinus tachycardia at a rate of 101, no ST elevations or depressions, prolonged QTC ____________________________________________  RADIOLOGY  I have personally reviewed the images performed during this visit and I agree with the Radiologist's read.   Interpretation by Radiologist:  DG Chest Portable 1 View  Result Date: 04/09/2021 CLINICAL DATA:  Shortness of breath and cough EXAM: PORTABLE CHEST 1 VIEW COMPARISON:  02/26/2021 FINDINGS: Cardiac shadow is enlarged but stable. Aortic calcifications are noted. Diffuse increased airspace opacity is noted consistent with worsening edema. No sizable effusion is seen. No confluent infiltrate is noted. No bony abnormalities are seen. IMPRESSION: Bilateral pulmonary edema. Electronically Signed   By: Inez Catalina M.D.   On: 04/09/2021 03:27     ____________________________________________   PROCEDURES  Procedure(s) performed:yes .1-3 Lead EKG Interpretation Performed by: Rudene Re, MD Authorized by: Rudene Re, MD     Interpretation: non-specific     ECG rate assessment: tachycardic     Rhythm: sinus tachycardia     Ectopy: none     Conduction: abnormal     Critical Care performed: yes  CRITICAL CARE Performed by: Rudene Re  ?  Total critical care  time: 40 min  Critical care time was exclusive of separately billable procedures and treating other patients.  Critical care was necessary to treat or prevent imminent or life-threatening deterioration.  Critical care was time spent personally by me on the following activities: development of treatment plan with patient and/or surrogate as well as nursing, discussions with consultants, evaluation of patient's response to treatment, examination of patient, obtaining history from patient or surrogate, ordering and performing treatments and interventions, ordering and review of laboratory studies, ordering and review of radiographic studies, pulse oximetry and re-evaluation of patient's condition.  ____________________________________________   INITIAL IMPRESSION / ASSESSMENT AND PLAN / ED COURSE   75 y.o. female with a history of diabetes, DVT, PAD on Plavix, hypertension, hyperlipidemia, diastolic CHF who presents for evaluation of shortness of breath, orthopnea, bilateral leg swelling.  Patient is extremely hypertensive with systolics in the 387F, hypoxic, tachypneic, increased work of breathing, elevated JVD, crackles, 2+ pitting edema bilateral lower extremities.  Presentation concerning for acute CHF exacerbation vs flash pulmonary edema/ HTN emergency.  Review of patient's chart shows that she had an echocardiogram done 5 weeks ago showing normal EF with grade 2 diastolic dysfunction.  EKG showing no acute ischemic changes. CXR confirming edema. Patient started on Bipap and nitro drip, IV lasix. Labs pending. Patient is on telemetry for close monitoring of cardiorespiratory status.  Old  medical records reviewed including patient's recent admission 5 weeks ago.  _________________________ 4:59 AM on 04/09/2021 ----------------------------------------- Work-up consistent with CHF exacerbation with a BNP of 378, chest x-ray with bilateral edema.  First troponin is 24 which is within patient's baseline.  Hypokalemia with a K of 2.4 and EKG changes of prolonged QTC.  We will supplement p.o. and IV.  Magnesium levels are pending.  Patient looks markedly improved on BiPAP, diuresing well, BPs improved on a nitro drip.  Will discuss with the hospitalist for admission    _____________________________________________ Please note:  Patient was evaluated in Emergency Department today for the symptoms described in the history of present illness. Patient was evaluated in the context of the global COVID-19 pandemic, which necessitated consideration that the patient might be at risk for infection with the SARS-CoV-2 virus that causes COVID-19. Institutional protocols and algorithms that pertain to the evaluation of patients at risk for COVID-19 are in a state of rapid change based on information released by regulatory bodies including the CDC and federal and state organizations. These policies and algorithms were followed during the patient's care in the ED.  Some ED evaluations and interventions may be delayed as a result of limited staffing during the pandemic.   [place] Controlled Substance Database was reviewed by me. ____________________________________________   FINAL CLINICAL IMPRESSION(S) / ED DIAGNOSES   Final diagnoses:  Acute respiratory failure with hypoxia (HCC)  Acute on chronic congestive heart failure, unspecified heart failure type [facility])  Hypertensive emergency      NEW MEDICATIONS STARTED DURING THIS VISIT:  ED Discharge Orders     None        Note:  This document was prepared using Dragon voice recognition software and may include unintentional dictation  errors.    Alfred Levins, [place], MD 04/09/21 0500

## 2021-04-09 NOTE — ED Triage Notes (Signed)
Patient from home with c/o shortness of breath/ cough tonight/ also BLE swelling

## 2021-04-10 ENCOUNTER — Ambulatory Visit: Payer: Self-pay

## 2021-04-10 DIAGNOSIS — E119 Type 2 diabetes mellitus without complications: Secondary | ICD-10-CM

## 2021-04-10 DIAGNOSIS — I509 Heart failure, unspecified: Secondary | ICD-10-CM

## 2021-04-10 DIAGNOSIS — Z789 Other specified health status: Secondary | ICD-10-CM

## 2021-04-10 DIAGNOSIS — I5033 Acute on chronic diastolic (congestive) heart failure: Secondary | ICD-10-CM | POA: Diagnosis not present

## 2021-04-10 LAB — BASIC METABOLIC PANEL
Anion gap: 8 (ref 5–15)
BUN: 12 mg/dL (ref 8–23)
CO2: 28 mmol/L (ref 22–32)
Calcium: 8.4 mg/dL — ABNORMAL LOW (ref 8.9–10.3)
Chloride: 99 mmol/L (ref 98–111)
Creatinine, Ser: 0.62 mg/dL (ref 0.44–1.00)
GFR, Estimated: >60 mL/min (ref >60)
Glucose, Bld: 186 mg/dL — ABNORMAL HIGH (ref 70–99)
Potassium: 3.1 mmol/L — ABNORMAL LOW (ref 3.5–5.1)
Sodium: 135 mmol/L (ref 135–145)

## 2021-04-10 LAB — CBG MONITORING, ED
Glucose-Capillary: 199 mg/dL — ABNORMAL HIGH (ref 70–99)
Glucose-Capillary: 182 mg/dL — ABNORMAL HIGH (ref 70–99)

## 2021-04-10 MED ORDER — METOPROLOL TARTRATE 25 MG PO TABS
25.0000 mg | ORAL_TABLET | Freq: Two times a day (BID) | ORAL | Status: DC
  Administered 2021-04-10 – 2021-04-13 (×7): 25 mg via ORAL
  Filled 2021-04-10 (×7): qty 1

## 2021-04-10 MED ORDER — POTASSIUM CHLORIDE CRYS ER 20 MEQ PO TBCR
40.0000 meq | EXTENDED_RELEASE_TABLET | Freq: Once | ORAL | Status: AC
  Administered 2021-04-10: 40 meq via ORAL
  Filled 2021-04-10: qty 2

## 2021-04-10 MED ORDER — INSULIN ASPART 100 UNIT/ML IJ SOLN
0.0000 [IU] | Freq: Three times a day (TID) | INTRAMUSCULAR | Status: DC
  Administered 2021-04-10 – 2021-04-13 (×3): 3 [IU] via SUBCUTANEOUS
  Filled 2021-04-10 (×4): qty 1

## 2021-04-10 MED ORDER — FUROSEMIDE 10 MG/ML IJ SOLN
INTRAMUSCULAR | Status: AC
  Administered 2021-04-10: 40 mg via INTRAVENOUS
  Filled 2021-04-10: qty 4

## 2021-04-10 MED ORDER — FUROSEMIDE 10 MG/ML IJ SOLN
60.0000 mg | Freq: Two times a day (BID) | INTRAMUSCULAR | Status: DC
  Administered 2021-04-10 – 2021-04-11 (×3): 60 mg via INTRAVENOUS
  Filled 2021-04-10 (×3): qty 8

## 2021-04-10 MED ORDER — MAGNESIUM SULFATE IN D5W 1-5 GM/100ML-% IV SOLN
1.0000 g | Freq: Once | INTRAVENOUS | Status: AC
  Administered 2021-04-10: 1 g via INTRAVENOUS
  Filled 2021-04-10: qty 100

## 2021-04-10 MED ORDER — FUROSEMIDE 10 MG/ML IJ SOLN: 40.0000 mg | Freq: Two times a day (BID) | INTRAMUSCULAR | Status: DC

## 2021-04-10 NOTE — ED Notes (Signed)
Pt has multiple requests. Pt's pillow repositioned, Pt given warm blankets. Pt's bed side tray cleaned up, pt's TV turned on and educated on remote use. Pt also repositioned.

## 2021-04-10 NOTE — Progress Notes (Signed)
[facility]  - [place] [facility]   PATIENT NAME: Lisa [name]    MR#:  [id number]  DATE OF BIRTH:  Jan 25, 1946  SUBJECTIVE:  came in with increasing shortness of breath bilateral lower extremity swelling all of a sudden for two days. No family at bedside. Patient is quite short of breath she was on BiPAP earlier. Currently on 5 L nasal cannula oxygen D sats easily. Received only 40 mg of IV Lasix. Additional doses ordered. Currently on IV Nitro drip. Denies any chest pain  REVIEW OF SYSTEMS:   Review of Systems  Constitutional:  Negative for chills, fever and weight loss.  HENT:  Negative for ear discharge, ear pain and nosebleeds.   Eyes:  Negative for blurred vision, pain and discharge.  Respiratory:  Positive for shortness of breath. Negative for sputum production, wheezing and stridor.   Cardiovascular:  Positive for orthopnea, leg swelling and PND. Negative for chest pain and palpitations.  Gastrointestinal:  Negative for abdominal pain, diarrhea, nausea and vomiting.  Genitourinary:  Negative for frequency and urgency.  Musculoskeletal:  Negative for back pain and joint pain.  Neurological:  Positive for weakness. Negative for sensory change, speech change and focal weakness.  Psychiatric/Behavioral:  Negative for depression and hallucinations. The patient is not nervous/anxious.   Tolerating Diet: Tolerating PT:   DRUG ALLERGIES:   Allergies  Allergen Reactions   Other Itching    Nucinta   Tapentadol Hcl     VITALS:  Blood pressure (!) 174/94, pulse 78, temperature 98.6 F (37 C), temperature source Oral, resp. rate 20, height 5\' 4"  (1.626 m), weight 64 kg, SpO2 99 %.  PHYSICAL EXAMINATION:   Physical Exam  GENERAL:  75 y.o.-year-old patient lying in the bed with  mild acute distress.  HEENT: Head atraumatic, normocephalic. Oropharynx and nasopharynx clear. JVD+ LUNGS: decreased breath sounds bilaterally, no wheezing, +rales,  no rhonchi. No  use of accessory muscles of respiration.  CARDIOVASCULAR: S1, S2 normal. No murmurs, rubs, or gallops.  ABDOMEN: Soft, nontender, nondistended. Bowel sounds present. No organomegaly or mass.  EXTREMITIES: ++edema b/l.    NEUROLOGIC: nonfocal PSYCHIATRIC:  patient is alert and oriented x 3.  SKIN: No obvious rash, lesion, or ulcer.   LABORATORY PANEL:  CBC Recent Labs  Lab 04/09/21 0336  WBC 7.1  HGB 11.1*  HCT 34.4*  PLT 265    Chemistries  Recent Labs  Lab 04/09/21 0336 04/09/21 0550  NA 141  --   K 2.4*  --   CL 103  --   CO2 29  --   GLUCOSE 166*  --   BUN 12  --   CREATININE 0.56  --   CALCIUM 8.8*  --   MG  --  1.5*   Cardiac Enzymes No results for input(s): TROPONINI in the last 168 hours. RADIOLOGY:  DG Chest Portable 1 View  Result Date: 04/09/2021 CLINICAL DATA:  Shortness of breath and cough EXAM: PORTABLE CHEST 1 VIEW COMPARISON:  02/26/2021 FINDINGS: Cardiac shadow is enlarged but stable. Aortic calcifications are noted. Diffuse increased airspace opacity is noted consistent with worsening edema. No sizable effusion is seen. No confluent infiltrate is noted. No bony abnormalities are seen. IMPRESSION: Bilateral pulmonary edema. Electronically Signed   By: 04/28/2021 M.D.   On: 04/09/2021 03:27   ASSESSMENT AND PLAN:  Lisa [name] is a 75 y.o. female with medical history significant for depression, hypertension, diabetes mellitus, glaucoma, chronic diastolic dysfunction CHF who presents to the  ER via EMS for evaluation of sudden onset shortness of breath and cough. Patient states that she was in her usual state of health until yesterday evening when she developed shortness of breath and a headache which she rarely gets. Patient also has bilateral lower extremity edema  acute hypoxic respiratory failure secondary to acute on chronic diastolic dysfunction with pulmonary edema malignant hypertension -- patient was on BiPAP now on 5 L nasal cannula  oxygen. -- IV Lasix 40 mg qd-- will increase to IV Lasix 60 mg BID -- currently on IV nitroglycerin drip -- continue metoprolol 25 BID, losartan -- monitor input output -- urine output so for 2500 mL -- recent echo from October 2022 noted. Patient has EF of 60 to 65% with grade 2 diastolic dysfunction  malignant hypertension -- titrate nitroglycerin drip -- metoprolol, losartan  Hypokalemia, hypomagnesimia -- pharmacy to replete  Diabetes -II with vision loss -- continue sliding scale insulin for now  chronic depression -- continue Celexa  tobacco abuse -- nicotine patch  Procedures: Family communication : none Consults : cardiology CODE STATUS: full DVT Prophylaxis : Lovenox Level of care: Stepdown Status is: Inpatient  Remains inpatient appropriate because: J CHF hypertensive urgency        TOTAL TIME TAKING CARE OF THIS PATIENT: 35 minutes.  >50% time spent on counselling and coordination of care  Note: This dictation was prepared with Dragon dictation along with smaller phrase technology. Any transcriptional errors that result from this process are unintentional.  Fritzi Mandes M.D    [facility]   CC: Primary care physician; Lisa Sizer, MD [name], female   DOB: Nov 13, 1945, 75 y.o.   MRN: [id number]

## 2021-04-10 NOTE — Chronic Care Management (AMB) (Signed)
  Chronic Care Management   CCM RN Visit Note  04/10/2021 Name: Lisa [name] MRN: [id number] DOB: 05-05-1946  Subjective: Lisa [name] is a 75 y.o. year old female who is a primary care patient of Alba Cory, MD. The care management team was consulted for assistance with disease management and care coordination needs.    Call received from [name]. Reports currently being evaluated in the emergency room at [facility] . Wanted message relayed to the care team in case they are unable to reach her.   PLAN Will update the CCM team Follow up pending discharge and disposition.   [facility] Care Management [facility] [phone]

## 2021-04-10 NOTE — TOC Initial Note (Signed)
Transition of Care [facility]) - Initial/Assessment Note    Patient Details  Name: Lisa [name] MRN: [id number] Date of Birth: 09/20/1945  Transition of Care [facility] CM/SW Contact:    Marina Goodell Phone Number: [phone] 04/10/2021, 4:53 PM  Clinical Narrative:                  Patient presents to [facility] due to SOB.  Patient lives alone and is independent with most ADLs, but needs family assistance to get to doctor's appointments. Patient does not use O2 at home.  Patient stated she is active with [facility] for RN/PT and is willing to continue with them after d/c. TOC will continue to follow.  Expected Discharge Plan: Home w Home Health Services Barriers to Discharge: No Barriers Identified, Continued Medical Work up   Patient Goals and CMS Choice Patient states their goals for this hospitalization and ongoing recovery are:: Wants to go back home      Expected Discharge Plan and Services Expected Discharge Plan: Home w Home Health Services In-house Referral: Clinical Social Work   Post Acute Care Choice: Home Health (Active with [facility]) Living arrangements for the past 2 months: Single Family Home                                      Prior Living Arrangements/Services Living arrangements for the past 2 months: Single Family Home Lives with:: Self Patient language and need for interpreter reviewed:: Yes Do you feel safe going back to the place where you live?: Yes      Need for Family Participation in Patient Care: No (Comment) Care giver support system in place?: No (comment) Current home services: Home PT, Home RN Criminal Activity/Legal Involvement Pertinent to Current Situation/Hospitalization: No - Comment as needed  Activities of Daily Living      Permission Sought/Granted Permission sought to share information with : Family Supports Permission granted to share information with : Yes, Verbal Permission Granted  Share  Information with NAME: Derrill [place] (Brother)   [phone] (Mobile)           Emotional Assessment Appearance:: Appears stated age Attitude/Demeanor/Rapport: Engaged Affect (typically observed): Calm Orientation: : Oriented to Self, Oriented to Place, Oriented to  Time, Oriented to Situation Alcohol / Substance Use: Not Applicable Psych Involvement: No (comment)  Admission diagnosis:  Acute respiratory failure with hypoxia (HCC) [J96.01] Hypertensive emergency [I16.1] Acute on chronic congestive heart failure, unspecified heart failure type (HCC) [I50.9] Acute CHF (congestive heart failure) (HCC) [I50.9] Patient Active Problem List   Diagnosis Date Noted   Acute CHF (congestive heart failure) (HCC) 04/10/2021   Hypertensive emergency 04/09/2021   Acute on chronic diastolic CHF (congestive heart failure) (HCC) 04/09/2021   Acute respiratory failure (HCC) 04/09/2021   Nicotine dependence 04/09/2021   AKI (acute kidney injury) (HCC) 02/26/2021   Hypokalemia 02/26/2021   Hypomagnesemia 02/26/2021   Bruising 02/26/2021   Syncope and collapse 02/26/2021   Prolonged QT interval 02/26/2021   Uncontrolled type 2 diabetes with eye complications 01/29/2016   DM (diabetes mellitus), type 2 with peripheral vascular complications (HCC) 09/25/2015   Amputation of left foot (HCC) 12/02/2014   Benign essential HTN 12/02/2014   Depression, major, recurrent, mild (HCC) 12/02/2014   Dyslipidemia 12/02/2014   Vitamin B12 deficiency 12/02/2014   Peripheral neuropathic pain 12/02/2014   Peripheral artery disease (HCC) 12/02/2014   Phantom limb (HCC)  12/02/2014   Diabetes mellitus with nephropathy (HCC) 12/02/2014   Detached retina 12/02/2014   Type 2 diabetes mellitus with peripheral neuropathy (HCC) 12/02/2014   Tobacco use 12/02/2014   Urge incontinence 12/02/2014   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   Vitamin D deficiency 12/02/2014   PCP:  Alba Cory, MD Pharmacy:   [facility] [phone]  [place], [place] - [address] [place] [address] [place] [address] Phone: [phone] Fax: [phone]     Social Determinants of Health (SDOH) Interventions    Readmission Risk Interventions No flowsheet data found.

## 2021-04-10 NOTE — ED Notes (Signed)
Pt's purewick came unlodged and pt urinated on bed. Full bed change including sheets, gown and chux and new brief as well as purewick. Pt was also found to have had a BM

## 2021-04-10 NOTE — Progress Notes (Signed)
[facility] Cardiology    SUBJECTIVE: Patient states she still does not feel well has some shortness of breath weakness cough denies much in the way of chest pain but in general just feels poorly.  Denies any fever chills or sweats   Vitals:   04/10/21 0230 04/10/21 0350 04/10/21 0430 04/10/21 0738  BP: (!) 142/79 (!) 151/85 (!) 161/93 (!) 159/117  Pulse: 73 79 81 89  Resp:  18  20  Temp:      TempSrc:      SpO2: 100% 100% 100% 98%  Weight:      Height:         Intake/Output Summary (Last 24 hours) at 04/10/2021 2836 Last data filed at 04/09/2021 2241 Gross per 24 hour  Intake 53 ml  Output 1100 ml  Net -1047 ml      PHYSICAL EXAM  General: Well developed, well nourished, in no acute distress HEENT:  Normocephalic and atramatic exophthalmos Neck:  No JVD.  Lungs: Clear bilaterally to auscultation and percussion. Heart: HRRR . Normal S1 and S2 without gallops or murmurs.  Abdomen: Bowel sounds are positive, abdomen soft and non-tender  Msk:  Back normal, normal gait. Normal strength and tone for age. Extremities: No clubbing, cyanosis or edema.   Neuro: Alert and oriented X 3. Psych:  Good affect, responds appropriately   LABS: Basic Metabolic Panel: Recent Labs    04/09/21 0336 04/09/21 0550  NA 141  --   K 2.4*  --   CL 103  --   CO2 29  --   GLUCOSE 166*  --   BUN 12  --   CREATININE 0.56  --   CALCIUM 8.8*  --   MG  --  1.5*   Liver Function Tests: No results for input(s): AST, ALT, ALKPHOS, BILITOT, PROT, ALBUMIN in the last 72 hours. No results for input(s): LIPASE, AMYLASE in the last 72 hours. CBC: Recent Labs    04/09/21 0336  WBC 7.1  NEUTROABS 6.1  HGB 11.1*  HCT 34.4*  MCV 87.1  PLT 265   Cardiac Enzymes: No results for input(s): CKTOTAL, CKMB, CKMBINDEX, TROPONINI in the last 72 hours. BNP: Invalid input(s): POCBNP D-Dimer: No results for input(s): DDIMER in the last 72 hours. Hemoglobin A1C: No results for input(s): HGBA1C in the  last 72 hours. Fasting Lipid Panel: No results for input(s): CHOL, HDL, LDLCALC, TRIG, CHOLHDL, LDLDIRECT in the last 72 hours. Thyroid Function Tests: No results for input(s): TSH, T4TOTAL, T3FREE, THYROIDAB in the last 72 hours.  Invalid input(s): FREET3 Anemia Panel: No results for input(s): VITAMINB12, FOLATE, FERRITIN, TIBC, IRON, RETICCTPCT in the last 72 hours.  DG Chest Portable 1 View  Result Date: 04/09/2021 CLINICAL DATA:  Shortness of breath and cough EXAM: PORTABLE CHEST 1 VIEW COMPARISON:  02/26/2021 FINDINGS: Cardiac shadow is enlarged but stable. Aortic calcifications are noted. Diffuse increased airspace opacity is noted consistent with worsening edema. No sizable effusion is seen. No confluent infiltrate is noted. No bony abnormalities are seen. IMPRESSION: Bilateral pulmonary edema. Electronically Signed   By: Alcide Clever M.D.   On: 04/09/2021 03:27     Echo preserved left ventricular function 60-65 with evidence of diastolic dysfunction  TELEMETRY: Sinus rhythm 85:  ASSESSMENT AND PLAN:  Principal Problem:   Acute on chronic diastolic CHF (congestive heart failure) (HCC) Active Problems:   Benign essential HTN   Depression, major, recurrent, mild (HCC)   DM (diabetes mellitus), type 2 with peripheral vascular complications (HCC)  Hypokalemia   Hypomagnesemia   Hypertensive emergency   Acute respiratory failure (HCC)   Nicotine dependence    Plan Recommend continue diuresis for heart failure pulm also concerned about lung related COPD issues Demand COPD treatment inhalers steroids consider pulmonary input Patient only has a modest elevation of BNP that I am concerned that this is not her main underlying problem for shortness of breath Recommend increasing metoprolol to 50 twice a day Recommend adding spironolactone 25 mg daily Correct electrolytes especially hypokalemia Continue diabetes management and control Aggressive antihypertensive control  beta-blocker ACE or ARB spironolactone Patient may need ischemic work-up at some point in the future either functional study or catheterization Agree diabetes management control Advised patient to refrain from tobacco abuse    Alwyn Pea, MD 04/10/2021 8:19 AM

## 2021-04-10 NOTE — ED Notes (Signed)
Pt.'s family updated.

## 2021-04-10 NOTE — Progress Notes (Signed)
PT Cancellation Note  Patient Details Name: Lisa [name] MRN: [id number] DOB: [birth year]   Cancelled Treatment:    Reason Eval/Treat Not Completed: Patient not medically ready  Per chart review, pt potassium at critical level 2.4 and MD requesting holding therapy til tomorrow per secure chat. Will re-attempt tomorrow as patient more medically ready to participate.  Verl Blalock, SPT  Verl Blalock 04/10/2021, 3:04 PM

## 2021-04-10 NOTE — ED Notes (Signed)
Pt let this RN check her CBG but then refused insulin due "I am not doing no more needles, I will take my oral medicine but that's it". Pt educated on hourly rounding.   Pt also complaining about medication changes and this RN explained the reasoning behind the medications pt still dissatisfied.

## 2021-04-10 NOTE — Progress Notes (Signed)
OT Cancellation Note  Patient Details Name: Lisa [name] MRN: [id number] DOB: February 17, 1946   Cancelled Treatment:    Reason Eval/Treat Not Completed: Other (comment) OT consult received and chart reviewed. Upon speaking to RN, she reports that pt is very SOB even with talking at bed level, not appropriate to see today. Will f/u next available date/time for OT evaluation. Thank you.  Rejeana Brock, MS, OTR/L ascom [phone] 04/10/21, 5:12 PM

## 2021-04-10 NOTE — ED Notes (Signed)
Pt resting comfortably at this time. Pt requests a mask, this RN provided one. Pt also requests to sit up, HOB elevated and pt educated this is not safe at this time due to her breathing.

## 2021-04-11 DIAGNOSIS — I5033 Acute on chronic diastolic (congestive) heart failure: Secondary | ICD-10-CM | POA: Diagnosis not present

## 2021-04-11 LAB — BASIC METABOLIC PANEL
Anion gap: 9 (ref 5–15)
BUN: 12 mg/dL (ref 8–23)
CO2: 29 mmol/L (ref 22–32)
Calcium: 8.1 mg/dL — ABNORMAL LOW (ref 8.9–10.3)
Chloride: 97 mmol/L — ABNORMAL LOW (ref 98–111)
Creatinine, Ser: 0.7 mg/dL (ref 0.44–1.00)
GFR, Estimated: >60 mL/min (ref >60)
Glucose, Bld: 116 mg/dL — ABNORMAL HIGH (ref 70–99)
Potassium: 3.1 mmol/L — ABNORMAL LOW (ref 3.5–5.1)
Sodium: 135 mmol/L (ref 135–145)

## 2021-04-11 LAB — CBG MONITORING, ED
Glucose-Capillary: 113 mg/dL — ABNORMAL HIGH (ref 70–99)
Glucose-Capillary: 65 mg/dL — ABNORMAL LOW (ref 70–99)
Glucose-Capillary: 61 mg/dL — ABNORMAL LOW (ref 70–99)
Glucose-Capillary: 78 mg/dL (ref 70–99)

## 2021-04-11 LAB — MAGNESIUM: Magnesium: 1.8 mg/dL (ref 1.7–2.4)

## 2021-04-11 MED ORDER — POTASSIUM CHLORIDE CRYS ER 20 MEQ PO TBCR
40.0000 meq | EXTENDED_RELEASE_TABLET | ORAL | Status: AC
  Administered 2021-04-11 (×2): 40 meq via ORAL
  Filled 2021-04-11 (×2): qty 2

## 2021-04-11 MED ORDER — DEXTROSE 50 % IV SOLN: 25.0000 mL | Freq: Once | INTRAVENOUS | Status: DC

## 2021-04-11 NOTE — Progress Notes (Signed)
PHARMACY CONSULT NOTE - FOLLOW UP  Pharmacy Consult for Electrolyte Monitoring and Replacement   Recent Labs: Potassium (mmol/L)  Date Value  04/11/2021 3.1 (L)   Magnesium (mg/dL)  Date Value  80/07/4915 1.8   Calcium (mg/dL)  Date Value  91/50/5697 8.1 (L)   Albumin (g/dL)  Date Value  94/80/1655 2.6 (L)  09/25/2015 4.4   Phosphorus (mg/dL)  Date Value  37/48/2707 3.9   Sodium (mmol/L)  Date Value  04/11/2021 135  09/25/2015 142     Assessment: 75 yo female who came in with increasing shortness of breath and bilateral lower extremity swelling all of a sudden for two days. Pharmacy has been consulted for electrolyte replacement.   Pt on IV lasix 60 mg BID  Goal of Therapy:  Electrolytes WNL  Plan:  K 3.1 - will order 40 mEq Kcl PO x 2 doses Recheck electrolytes with AM labs  Raiford Noble ,PharmD Clinical Pharmacist 04/11/2021 9:49 AM

## 2021-04-11 NOTE — ED Notes (Signed)
RN aware bed assigned ?

## 2021-04-11 NOTE — Consult Note (Signed)
   Heart Failure Nurse Navigator Note  HFpEF 60 to 65%.  Mild LVH.  Grade 2 diastolic dysfunction.  She presented to the emergency room with sudden onset of shortness of breath, bilateral lower extremity edema and orthopnea.  Chest x-ray revealed pulmonary edema.  Comorbidities:  Diabetes Hypertension Depression Nicotine dependence  Medications:  Potassium chloride 40 mEq Furosemide 60 mg IV twice daily Metoprolol tart Tate 25 mg twice daily Plavix 75 mg daily Losartan 100 mg daily Crestor 10 mg daily  Labs:  Sodium 135, potassium 3.1, chloride 97, CO2 29, BUN 12, creatinine 0.7, GFR greater than 60.  Initial meeting with patient today in the ED.   She states that she has her own home and she has a younger brother that sometimes stays with her and then other times goes and stays with other relatives.  She states that she is very active in the home and does her own cooking and cleaning but that she does not eat in restaurants quite frequently.  Discussed making good choices when eating out.  She states that she uses very little salt on foods at the table.  She states that she has a scale, discussed the routine of daily weight and what to report to physician.  Discussed the outpatient heart failure clinic, she has an appointment on November 30 at 2 PM.  She has a no-show rate of 6% which is 3 out of 49 appointments.  She had no further questions and we will continue to follow along.  Tresa Endo RN CHFN  Also discussed fluid restriction and reasoning behind that.

## 2021-04-11 NOTE — ED Notes (Signed)
BGL 61. 8oz orange juice and dinner tray provided to patient. Patient denies symptoms. MD notified.

## 2021-04-11 NOTE — Progress Notes (Signed)
PT Cancellation Note  Patient Details Name: Lisa [name] MRN: [id number] DOB: 1945-07-18   Cancelled Treatment:    Reason Eval/Treat Not Completed: Fatigue/lethargy limiting ability to participate  Second attempt to see patient today. Upon entry, pt sound asleep in bed and unable to participate in therapy. Will re-attempt tomorrow.  Verl Blalock, SPT  Verl Blalock 04/11/2021, 2:34 PM

## 2021-04-11 NOTE — Progress Notes (Signed)
[facility] Cardiology    SUBJECTIVE: Patient states he feels much better blood pressure much better controlled less shortness of breath less leg swelling denies any significant chest pain   Vitals:   04/11/21 1030 04/11/21 1300 04/11/21 1330 04/11/21 1342  BP: (!) 134/59 (!) 184/67    Pulse: (!) 56 (!) 50  97  Resp: 14 19 19 18   Temp:      TempSrc:      SpO2: 100% 100%  100%  Weight:      Height:         Intake/Output Summary (Last 24 hours) at 04/11/2021 1343 Last data filed at 04/11/2021 1053 Gross per 24 hour  Intake --  Output 2500 ml  Net -2500 ml      PHYSICAL EXAM  General: Well developed, well nourished, in no acute distress HEENT:  Normocephalic and atramatic Neck:  No JVD.  Lungs: Clear bilaterally to auscultation and percussion. Heart: HRRR . Normal S1 and S2 without gallops or murmurs.  Abdomen: Bowel sounds are positive, abdomen soft and non-tender  Msk:  Back normal, normal gait. Normal strength and tone for age. Extremities: No clubbing, cyanosis or edema.   Neuro: Alert and oriented X 3. Psych:  Good affect, responds appropriately   LABS: Basic Metabolic Panel: Recent Labs    04/09/21 0550 04/10/21 1453 04/11/21 0615  NA  --  135 135  K  --  3.1* 3.1*  CL  --  99 97*  CO2  --  28 29  GLUCOSE  --  186* 116*  BUN  --  12 12  CREATININE  --  0.62 0.70  CALCIUM  --  8.4* 8.1*  MG 1.5*  --  1.8   Liver Function Tests: No results for input(s): AST, ALT, ALKPHOS, BILITOT, PROT, ALBUMIN in the last 72 hours. No results for input(s): LIPASE, AMYLASE in the last 72 hours. CBC: Recent Labs    04/09/21 0336  WBC 7.1  NEUTROABS 6.1  HGB 11.1*  HCT 34.4*  MCV 87.1  PLT 265   Cardiac Enzymes: No results for input(s): CKTOTAL, CKMB, CKMBINDEX, TROPONINI in the last 72 hours. BNP: Invalid input(s): POCBNP D-Dimer: No results for input(s): DDIMER in the last 72 hours. Hemoglobin A1C: No results for input(s): HGBA1C in the last 72 hours. Fasting  Lipid Panel: No results for input(s): CHOL, HDL, LDLCALC, TRIG, CHOLHDL, LDLDIRECT in the last 72 hours. Thyroid Function Tests: No results for input(s): TSH, T4TOTAL, T3FREE, THYROIDAB in the last 72 hours.  Invalid input(s): FREET3 Anemia Panel: No results for input(s): VITAMINB12, FOLATE, FERRITIN, TIBC, IRON, RETICCTPCT in the last 72 hours.  No results found.   Echo preserved left ventricular function ejection fraction of around 60%  TELEMETRY: Normal sinus rhythm rate of about 80 nonspecific findings:  ASSESSMENT AND PLAN:  Principal Problem:   Acute on chronic diastolic CHF (congestive heart failure) (HCC) Active Problems:   Benign essential HTN   Depression, major, recurrent, mild (HCC)   DM (diabetes mellitus), type 2 with peripheral vascular complications (HCC)   Hypokalemia   Hypomagnesemia   Hypertensive emergency   Acute respiratory failure (HCC)   Nicotine dependence   Acute CHF (congestive heart failure) (HCC) Peripheral vascular disease with amputations of toes  Plan Continue telemetry level care Follow-up EKGs and troponin Supplemental oxygen inhalers as necessary Probable COPD so inhalers steroids may be of some benefit Acute on chronic diastolic congestive heart failure continue diuretics aggressive blood pressure control Continue to correct electrolytes  including hypokalemia NicoDerm patch for nicotine addiction Maintain diabetes management and control DVT prophylaxis no need for ACS anticoagulation but will consider Plavix and aspirin Consider increase activity with physical therapy Do not recommend cardiac cath at this stage but may very well need invasive ischemic evaluation but may be able to be done as an outpatient   Yolonda Kida, MD 04/11/2021 1:43 PM

## 2021-04-11 NOTE — Progress Notes (Addendum)
[place] [facility] [place] NAME: Lisa [name]    MR#:  [id number]  DATE OF BIRTH:  09-15-1945  SUBJECTIVE:  came in with increasing shortness of breath bilateral lower extremity swelling all of a sudden for two days. No family at bedside.   Patient currently is off Nitro drip. Blood pressure much stabilize. Able to speak sentences completely without getting short of breath. Good urine output with IV Lasix. REVIEW OF SYSTEMS:   Review of Systems  Constitutional:  Negative for chills, fever and weight loss.  HENT:  Negative for ear discharge, ear pain and nosebleeds.   Eyes:  Negative for blurred vision, pain and discharge.  Respiratory:  Positive for shortness of breath. Negative for sputum production, wheezing and stridor.   Cardiovascular:  Positive for orthopnea, leg swelling and PND. Negative for chest pain and palpitations.  Gastrointestinal:  Negative for abdominal pain, diarrhea, nausea and vomiting.  Genitourinary:  Negative for frequency and urgency.  Musculoskeletal:  Negative for back pain and joint pain.  Neurological:  Positive for weakness. Negative for sensory change, speech change and focal weakness.  Psychiatric/Behavioral:  Negative for depression and hallucinations. The patient is not nervous/anxious.   Tolerating Diet:yes Tolerating PT: pending  DRUG ALLERGIES:   Allergies  Allergen Reactions   Other Itching    Nucinta   Tapentadol Hcl     VITALS:  Blood pressure (!) 184/67, pulse 97, temperature 98.6 F (37 C), temperature source Oral, resp. rate 18, height 5\' 4"  (1.626 m), weight 64 kg, SpO2 100 %.  PHYSICAL EXAMINATION:   Physical Exam  GENERAL:  75 y.o.-year-old patient lying in the bed with  mild acute distress.  HEENT: Head atraumatic, normocephalic. Oropharynx and nasopharynx clear. JVD+ LUNGS: decreased breath sounds bilaterally, no wheezing, +rales,  no rhonchi. No use of accessory muscles of respiration.   CARDIOVASCULAR: S1, S2 normal. No murmurs, rubs, or gallops.  ABDOMEN: Soft, nontender, nondistended. Bowel sounds present. No organomegaly or mass.  EXTREMITIES: +edema b/l.    NEUROLOGIC: nonfocal PSYCHIATRIC:  patient is alert and oriented x 3.  SKIN: No obvious rash, lesion, or ulcer.   LABORATORY PANEL:  CBC Recent Labs  Lab 04/09/21 0336  WBC 7.1  HGB 11.1*  HCT 34.4*  PLT 265     Chemistries  Recent Labs  Lab 04/11/21 0615  NA 135  K 3.1*  CL 97*  CO2 29  GLUCOSE 116*  BUN 12  CREATININE 0.70  CALCIUM 8.1*  MG 1.8    Cardiac Enzymes No results for input(s): TROPONINI in the last 168 hours. RADIOLOGY:  No results found. ASSESSMENT AND PLAN:  Lisa [name] is a 75 y.o. female with medical history significant for depression, hypertension, diabetes mellitus, glaucoma, chronic diastolic dysfunction CHF who presents to the ER via EMS for evaluation of sudden onset shortness of breath and cough. Patient states that she was in her usual state of health until yesterday evening when she developed shortness of breath and a headache which she rarely gets. Patient also has bilateral lower extremity edema  acute hypoxic respiratory failure secondary to acute on chronic diastolic dysfunction with pulmonary edema malignant hypertension -- patient was on BiPAP now on 5 L nasal cannula oxygen. -- IV Lasix 40 mg qd-- will increase to IV Lasix 60 mg BID -- currently on IV nitroglycerin drip -- continue metoprolol 25 BID, losartan -- monitor input output -- urine output so for 2500 mL -- recent echo from October  2022 noted. Patient has EF of 60 to 65% with grade 2 diastolic dysfunction --11/17--off iv nitro gtt, Good uop ~5100 cc, SOB improved  malignant hypertension -- titrate nitroglycerin drip -- metoprolol, losartan  Hypokalemia, hypomagnesimia -- pharmacy to replete  Diabetes II with vision loss -- continue sliding scale insulin for now  chronic depression --  continue Celexa  tobacco abuse -- nicotine patch  Procedures: Family communication : tried calling Brother Gordon--phone keeps ringing Consults : cardiology CODE STATUS: full DVT Prophylaxis : Lovenox Level of care: Telemetry Cardiac Status is: Inpatient  Remains inpatient appropriate because: CHF, hypertensive urgency  PT/OT to see pt      TOTAL TIME TAKING CARE OF THIS PATIENT: 25 minutes.  >50% time spent on counselling and coordination of care  Note: This dictation was prepared with Dragon dictation along with smaller phrase technology. Any transcriptional errors that result from this process are unintentional.  Enedina Finner M.D    [facility]   CC: Primary care physician; Alba Cory, MD [name], female   DOB: 1945-08-06, 75 y.o.   MRN: [id number]

## 2021-04-11 NOTE — Evaluation (Signed)
Occupational Therapy Evaluation Patient Details Name: Lisa [name] MRN: [id number] DOB: 17-Aug-1945 Today's Date: 04/11/2021   History of Present Illness Pt is a 75 y/o F with PMH: T2DM, blindness in R eye, DVT, glaucoma, HTN, depression, L LE transmet amputation, tobacco abuse, and chronic dCHF who presented d/t increasing shortness of breath bilateral lower extremity swelling. Pt adm for acute hypoxic respiratory failure secondary to acute on chronic diastolic dysfunction with pulmonary edema.   Clinical Impression   Pt seen for OT evaluation this date in setting of acute hospitalization d/t HF exacerbation. Pt reports living in [facility] with her brother and having 2 STE. States that she'll call family members for rides, but is otherwise able to perform I/ADLs. States that she alternates between cane and RW for balance with fxl mobility. She presents this date with decreased activity tolerance and general deconditioning. On OT assessment, she requires:  SETUP for seated UB ADLs including dressing, MOD A for donning socks, MIN A for balance during standing peri care after BM. She is somewhat unsteady with fxl mobility and ADL transfers requiring RW and CGA. OT attempts to monitor O2 throughout session, but difficult to ascertain adequate pleth with mobilization. When O2 is able to be obtained, she is 90-91% on room air with mobility/activity and 100% at rest on room air. Opted to leave nasal cannula off at this time and placed within pt's reach. In addition, switched pulse oximeter to L ring finger as it appears to yield a result more often, but still inconsistent even then. RN notified of findings and that pt will require new pure-wick and cannister. Pt left in bed with call button in reach. Will continue to follow acutely. Anticipate that pt will be appropriate to return home with HHOT f/u to ensure safety with self care in the natural environment.     Recommendations for follow up therapy are one  component of a multi-disciplinary discharge planning process, led by the attending physician.  Recommendations may be updated based on patient status, additional functional criteria and insurance authorization.   Follow Up Recommendations  Home health OT    Assistance Recommended at Discharge Intermittent Supervision/Assistance  Functional Status Assessment  Patient has had a recent decline in their functional status and demonstrates the ability to make significant improvements in function in a reasonable and predictable amount of time.  Equipment Recommendations  BSC/3in1    Recommendations for Other Services       Precautions / Restrictions Precautions Precautions: Fall Restrictions Weight Bearing Restrictions: No      Mobility Bed Mobility Overal bed mobility: Needs Assistance Bed Mobility: Supine to Sit;Sit to Supine     Supine to sit: Min assist Sit to supine: Supervision        Transfers Overall transfer level: Needs assistance Equipment used: Rolling walker (2 wheels) Transfers: Sit to/from Stand Sit to Stand: Min guard           General transfer comment: increased time, cues for safe use of RW (usually uses cane)      Balance Overall balance assessment: Needs assistance Sitting-balance support: Feet supported Sitting balance-Leahy Scale: Good     Standing balance support: Bilateral upper extremity supported Standing balance-Leahy Scale: Fair                             ADL either performed or assessed with clinical judgement   ADL Overall ADL's : Needs assistance/impaired  General ADL Comments: SETUP for seated UB ADLs including dressing, MOD A for donning socks, MIN A for balance during standing peri care after BM.     Vision   Additional Comments: low vision at baseline with h/o glaucoma and R eye blindness     Perception     Praxis      Pertinent Vitals/Pain Pain  Assessment: No/denies pain     Hand Dominance Right   Extremity/Trunk Assessment Upper Extremity Assessment Upper Extremity Assessment: Generalized weakness;Overall WFL for tasks assessed (ROM WFL, MMT grossly 4-/5)   Lower Extremity Assessment Lower Extremity Assessment: Generalized weakness (h/o L foot transmet amp)       Communication Communication Communication: No difficulties   Cognition Arousal/Alertness: Awake/alert Behavior During Therapy: WFL for tasks assessed/performed Overall Cognitive Status: Within Functional Limits for tasks assessed                                 General Comments: somewhat foggy mentally at beginning of session, becomes more appropriate once more aware. Oriented to month/year, town/hospital but not unit, and some aspects of situation-SOB     General Comments       Exercises Other Exercises Other Exercises: OT ed re: role of OT, PLB, safety considerations   Shoulder Instructions      Home Living Family/patient expects to be discharged to:: Private residence Living Arrangements: Other (Comment) (brother lives with her, but pt reports he is an alcoholic and it's not necessarily a good situation) Available Help at Discharge: Family;Available PRN/intermittently Type of Home: House Home Access: Stairs to enter [employer] of Steps: 2   Home Layout: One level     Bathroom Shower/Tub: Estate manager/land agent Accessibility: Yes How Accessible: Accessible via wheelchair Home Equipment: Agricultural consultant (2 wheels);Cane - single point          Prior Functioning/Environment Prior Level of Function : Independent/Modified Independent             Mobility Comments: Pt reports being able to amb at baseline with cane or RW (alternates) ADLs Comments: Pt reports performing all basic ADLs and all HH IADLs I'ly/MOD I. States that she hasn't been driving for months now and calls family members for rides (cousins  mostly)        OT Problem List: Decreased strength;Decreased activity tolerance;Decreased knowledge of use of DME or AE      OT Treatment/Interventions: Self-care/ADL training;Therapeutic exercise;Energy conservation;Therapeutic activities;DME and/or AE instruction    OT Goals(Current goals can be found in the care plan section) Acute Rehab OT Goals Patient Stated Goal: to go home OT Goal Formulation: With patient Time For Goal Achievement: 04/25/21 Potential to Achieve Goals: Good  OT Frequency: Min 2X/week   Barriers to D/C:            Co-evaluation              AM-PAC OT "6 Clicks" Daily Activity     Outcome Measure Help from another person eating meals?: None Help from another person taking care of personal grooming?: A Little Help from another person toileting, which includes using toliet, bedpan, or urinal?: A Little Help from another person bathing (including washing, rinsing, drying)?: A Little Help from another person to put on and taking off regular upper body clothing?: A Little Help from another person to put on and taking off regular lower body clothing?: A Lot 6 Click Score:  18   End of Session Equipment Utilized During Treatment: Gait belt;Rolling walker (2 wheels) Nurse Communication: Mobility status;Other (comment) (difficulty getting accurate pleth for OT, notified that urine cannister nearly full and needing new purewick. Notified that when O2 pleth was able to be obtained, that it was >90 on RA, so nasal cannula was left off. placed within pt's reach.)  Activity Tolerance: Patient tolerated treatment well Patient left: in bed;with call bell/phone within reach  OT Visit Diagnosis: Unsteadiness on feet (R26.81);Muscle weakness (generalized) (M62.81)                Time: 8756-4332 OT Time Calculation (min): 68 min Charges:  OT General Charges $OT Visit: 1 Visit OT Evaluation $OT Eval Moderate Complexity: 1 Mod OT Treatments $Self Care/Home  Management : 38-52 mins $Therapeutic Activity: 23-37 mins  Rejeana Brock, MS, OTR/L ascom [phone] 04/11/21, 6:06 PM

## 2021-04-11 NOTE — Progress Notes (Signed)
PT Cancellation Note  Patient Details Name: Lisa [name] MRN: [id number] DOB: November 22, 1945   Cancelled Treatment:    Reason Eval/Treat Not Completed: Patient declined, no reason specified;Fatigue/lethargy limiting ability to participate  Entered patient's room and patient demonstrating increased lethargy with difficulty keeping eyes open. Pt requesting to rest at this time. Will re-attempt at a later date.  Verl Blalock, SPT  Verl Blalock 04/11/2021, 9:29 AM

## 2021-04-11 NOTE — ED Notes (Signed)
Informed RN bed assigned 

## 2021-04-12 ENCOUNTER — Encounter: Admission: RE | Payer: Self-pay | Source: Home / Self Care

## 2021-04-12 ENCOUNTER — Other Ambulatory Visit: Payer: Self-pay

## 2021-04-12 ENCOUNTER — Ambulatory Visit: Admission: RE | Admit: 2021-04-12 | Payer: Medicare Other | Source: Home / Self Care | Admitting: Gastroenterology

## 2021-04-12 ENCOUNTER — Ambulatory Visit: Payer: Self-pay

## 2021-04-12 DIAGNOSIS — I5033 Acute on chronic diastolic (congestive) heart failure: Secondary | ICD-10-CM | POA: Diagnosis not present

## 2021-04-12 DIAGNOSIS — Z789 Other specified health status: Secondary | ICD-10-CM

## 2021-04-12 DIAGNOSIS — Z9181 History of falling: Secondary | ICD-10-CM

## 2021-04-12 DIAGNOSIS — E1142 Type 2 diabetes mellitus with diabetic polyneuropathy: Secondary | ICD-10-CM

## 2021-04-12 LAB — CBC
HCT: 28.9 % — ABNORMAL LOW (ref 36.0–46.0)
Hemoglobin: 9.5 g/dL — ABNORMAL LOW (ref 12.0–15.0)
MCH: 28.4 pg (ref 26.0–34.0)
MCHC: 32.9 g/dL (ref 30.0–36.0)
MCV: 86.3 fL (ref 80.0–100.0)
Platelets: 246 10*3/uL (ref 150–400)
RBC: 3.35 MIL/uL — ABNORMAL LOW (ref 3.87–5.11)
RDW: 18.9 % — ABNORMAL HIGH (ref 11.5–15.5)
WBC: 5.7 10*3/uL (ref 4.0–10.5)
nRBC: 0 % (ref 0.0–0.2)

## 2021-04-12 LAB — BASIC METABOLIC PANEL
Anion gap: 9 (ref 5–15)
BUN: 18 mg/dL (ref 8–23)
CO2: 30 mmol/L (ref 22–32)
Calcium: 8.6 mg/dL — ABNORMAL LOW (ref 8.9–10.3)
Chloride: 100 mmol/L (ref 98–111)
Creatinine, Ser: 0.73 mg/dL (ref 0.44–1.00)
GFR, Estimated: >60 mL/min (ref >60)
Glucose, Bld: 172 mg/dL — ABNORMAL HIGH (ref 70–99)
Potassium: 3.9 mmol/L (ref 3.5–5.1)
Sodium: 139 mmol/L (ref 135–145)

## 2021-04-12 LAB — MAGNESIUM: Magnesium: 1.7 mg/dL (ref 1.7–2.4)

## 2021-04-12 LAB — GLUCOSE, CAPILLARY
Glucose-Capillary: 129 mg/dL — ABNORMAL HIGH (ref 70–99)
Glucose-Capillary: 118 mg/dL — ABNORMAL HIGH (ref 70–99)
Glucose-Capillary: 193 mg/dL — ABNORMAL HIGH (ref 70–99)
Glucose-Capillary: 188 mg/dL — ABNORMAL HIGH (ref 70–99)

## 2021-04-12 SURGERY — COLONOSCOPY WITH PROPOFOL
Anesthesia: General

## 2021-04-12 MED ORDER — FUROSEMIDE 10 MG/ML IJ SOLN
40.0000 mg | Freq: Two times a day (BID) | INTRAMUSCULAR | Status: DC
  Administered 2021-04-12: 40 mg via INTRAVENOUS
  Filled 2021-04-12: qty 4

## 2021-04-12 MED ORDER — NITROGLYCERIN 0.4 MG SL SUBL: 0.4000 mg | SUBLINGUAL_TABLET | SUBLINGUAL | Status: DC | PRN

## 2021-04-12 MED ORDER — INFLUENZA VAC A&B SA ADJ QUAD 0.5 ML IM PRSY
0.5000 mL | PREFILLED_SYRINGE | INTRAMUSCULAR | Status: DC
  Filled 2021-04-12: qty 0.5

## 2021-04-12 MED ORDER — FUROSEMIDE 10 MG/ML IJ SOLN
40.0000 mg | Freq: Once | INTRAMUSCULAR | Status: AC
  Administered 2021-04-12: 40 mg via INTRAVENOUS

## 2021-04-12 NOTE — Plan of Care (Signed)
  Problem: Pain Managment: Goal: General experience of comfort will improve Outcome: Progressing   Problem: Skin Integrity: Goal: Risk for impaired skin integrity will decrease Outcome: Progressing   

## 2021-04-12 NOTE — Progress Notes (Signed)
[facility] Cardiology    SUBJECTIVE: Patient is feeling improved today but remains on 2 L of oxygen.  Blood pressure is much better controlled in the 130s systolic.   Vitals:   04/11/21 2030 04/11/21 2200 04/11/21 2352 04/12/21 0802  BP: (!) 158/81 (!) 161/87 140/66 133/73  Pulse: 69 80 72 65  Resp: 20 18 18 18   Temp:   98.6 F (37 C) 99.2 F (37.3 C)  TempSrc:      SpO2: 91% 96% 90% 91%  Weight:      Height:         Intake/Output Summary (Last 24 hours) at 04/12/2021 1128 Last data filed at 04/12/2021 0541 Gross per 24 hour  Intake 1269.89 ml  Output 950 ml  Net 319.89 ml       PHYSICAL EXAM  General: Well developed, well nourished, in no acute distress HEENT:  Normocephalic and atramatic Neck:  No JVD.  Lungs: Clear bilaterally to auscultation and percussion. Heart: HRRR . Normal S1 and S2 without gallops or murmurs.  Abdomen: Bowel sounds are positive, abdomen soft and non-tender  Msk:  Back normal, normal gait. Normal strength and tone for age. Extremities: No clubbing, cyanosis or edema.   Neuro: Alert and oriented X 3. Psych:  Good affect, responds appropriately   LABS: Basic Metabolic Panel: Recent Labs    04/11/21 0615 04/12/21 0523  NA 135 139  K 3.1* 3.9  CL 97* 100  CO2 29 30  GLUCOSE 116* 172*  BUN 12 18  CREATININE 0.70 0.73  CALCIUM 8.1* 8.6*  MG 1.8 1.7    Liver Function Tests: No results for input(s): AST, ALT, ALKPHOS, BILITOT, PROT, ALBUMIN in the last 72 hours. No results for input(s): LIPASE, AMYLASE in the last 72 hours. CBC: Recent Labs    04/12/21 0812  WBC 5.7  HGB 9.5*  HCT 28.9*  MCV 86.3  PLT 246    Cardiac Enzymes: No results for input(s): CKTOTAL, CKMB, CKMBINDEX, TROPONINI in the last 72 hours. BNP: Invalid input(s): POCBNP D-Dimer: No results for input(s): DDIMER in the last 72 hours. Hemoglobin A1C: No results for input(s): HGBA1C in the last 72 hours. Fasting Lipid Panel: No results for input(s): CHOL,  HDL, LDLCALC, TRIG, CHOLHDL, LDLDIRECT in the last 72 hours. Thyroid Function Tests: No results for input(s): TSH, T4TOTAL, T3FREE, THYROIDAB in the last 72 hours.  Invalid input(s): FREET3 Anemia Panel: No results for input(s): VITAMINB12, FOLATE, FERRITIN, TIBC, IRON, RETICCTPCT in the last 72 hours.  No results found.   Echo preserved left ventricular function ejection fraction of around 60%  TELEMETRY: Normal sinus rhythm rate of about 80 nonspecific findings:  ASSESSMENT AND PLAN:  Principal Problem:   Acute on chronic diastolic CHF (congestive heart failure) (HCC) Active Problems:   Benign essential HTN   Depression, major, recurrent, mild (HCC)   DM (diabetes mellitus), type 2 with peripheral vascular complications (HCC)   Hypokalemia   Hypomagnesemia   Hypertensive emergency   Acute respiratory failure (HCC)   Nicotine dependence   Acute CHF (congestive heart failure) (HCC) Peripheral vascular disease with amputations of toes  Plan She was admitted with acute flash pulmonary edema in the setting of hypertensive emergency and diastolic heart failure.  She is improved dramatically with diuresis and reinitiation of her blood pressure medications. - Continue telemetry level care - Supplemental oxygen inhalers as necessary -Continue diuresis today with Lasix IV 40 mg twice daily. -Continue losartan and metoprolol - DVT prophylaxis no need for ACS  anticoagulation but will consider Plavix and aspirin - Consider increase activity with physical therapy -Patient declines cardiac cath and states that she does not "want anyone cutting on her."  She will discuss this at follow-up with Dr. Juliann Pares.   Armando Reichert, MD 04/12/2021 11:28 AM

## 2021-04-12 NOTE — Chronic Care Management (AMB) (Signed)
Chronic Care Management   CCM RN Visit Note  04/12/2021 Name: Lisa [name] MRN: [id number] DOB: [birth year]  Subjective: Lisa [name] is a 75 y.o. year old female who is a primary care patient of Steele Sizer, MD. The care management team was consulted for assistance with disease management and care coordination needs.    Patient is currently admitted at [facility]. Message received from inpatient staff regarding patient's request for a call from the care management team. Spoke with the patient and her sister-in-law/Lisa [name] today.  Consent to Services:  The patient was given information about Chronic Care Management services, agreed to services, and gave verbal consent prior to initiation of services.  Please see initial visit note for detailed documentation.   Assessment: Review of patient past medical history, allergies, medications, health status, including review of consultants reports, laboratory and other test data, was performed as part of comprehensive evaluation and provision of chronic care management services.    CCM Care Plan  Allergies  Allergen Reactions   Other Itching    Nucinta   Tapentadol Hcl     Facility-Administered Encounter Medications as of 04/12/2021  Medication   0.9 %  sodium chloride infusion   acetaminophen (TYLENOL) tablet 650 mg   Or   acetaminophen (TYLENOL) suppository 650 mg   aspirin EC tablet 81 mg   atropine 1 % ophthalmic solution 1 drop   brimonidine (ALPHAGAN) 0.2 % ophthalmic solution 1 drop   citalopram (CELEXA) tablet 20 mg   clopidogrel (PLAVIX) tablet 75 mg   dorzolamide-timolol (COSOPT) 22.3-6.8 MG/ML ophthalmic solution 1 drop   enoxaparin (LOVENOX) injection 40 mg   furosemide (LASIX) injection 40 mg   [START ON 04/13/2021] influenza vaccine adjuvanted (FLUAD) injection 0.5 mL   insulin aspart (novoLOG) injection 0-15 Units   losartan (COZAAR) tablet 100 mg   metoprolol tartrate (LOPRESSOR) tablet 25  mg   multivitamin with minerals tablet 1 tablet   nitroGLYCERIN (NITROSTAT) SL tablet 0.4 mg   prednisoLONE acetate (PRED FORTE) 1 % ophthalmic suspension 1 drop   psyllium (HYDROCIL/METAMUCIL) 1 packet   rosuvastatin (CRESTOR) tablet 10 mg   sodium chloride flush (NS) 0.9 % injection 3 mL   sodium chloride flush (NS) 0.9 % injection 3 mL   vitamin B-12 (CYANOCOBALAMIN) tablet 1,000 mcg   [START ON 04/14/2021] Vitamin D (Ergocalciferol) (DRISDOL) capsule 50,000 Units   Outpatient Encounter Medications as of 04/12/2021  Medication Sig Note   amLODipine (NORVASC) 2.5 MG tablet Take 1 tablet (2.5 mg total) by mouth daily.    aspirin 81 MG tablet Take 81 mg by mouth daily.    atropine 1 % ophthalmic solution Place 1 drop into the right eye 3 (three) times daily. 02/26/2021: Last dispensed: 12/31/2020 for 34d supply    blood glucose meter kit and supplies Dispense based on patient and insurance preference. Use up to four times daily as directed. (FOR ICD-10 E10.9, E11.9).    Blood Glucose Monitoring Suppl (ACCU-CHEK AVIVA CONNECT) w/Device KIT 1 each by Does not apply route daily. And supplies E11.9 check fsbs bid    brimonidine (ALPHAGAN) 0.2 % ophthalmic solution Place 1 drop into both eyes 2 (two) times daily. 02/26/2021: Last dispensed: 12/31/2020 for 50d supply   citalopram (CELEXA) 20 MG tablet Take 1 tablet (20 mg total) by mouth daily.    clopidogrel (PLAVIX) 75 MG tablet Take 1 tablet (75 mg total) by mouth daily.    Cyanocobalamin (B-12) 1000 MCG SUBL Place 1 tablet under the tongue  daily.    dorzolamide-timolol (COSOPT) 22.3-6.8 MG/ML ophthalmic solution SMARTSIG:In Eye(s)    JANUVIA 100 MG tablet TAKE 1 TABLET BY MOUTH EVERY DAY    losartan (COZAAR) 100 MG tablet Take 1 tablet (100 mg total) by mouth daily.    Multiple Vitamin (MULTIVITAMIN) tablet Take 1 tablet by mouth daily.    pioglitazone (ACTOS) 15 MG tablet Take 1 tablet (15 mg total) by mouth daily.    potassium chloride  (KLOR-CON) 10 MEQ tablet Take 1 tablet (10 mEq total) by mouth 2 (two) times daily for 5 days.    prednisoLONE acetate (PRED FORTE) 1 % ophthalmic suspension Place into the right eye.    psyllium (METAMUCIL) 0.52 g capsule Take 1 capsule (0.52 g total) by mouth daily. 04/05/2021: Reports receiving metamucil powder at pharmacy.   rosuvastatin (CRESTOR) 10 MG tablet Take 1 tablet (10 mg total) by mouth daily.    Vitamin D, Ergocalciferol, (DRISDOL) 1.25 MG (50000 UNIT) CAPS capsule TAKE 1 CAPSULE (50,000 UNITS TOTAL) BY MOUTH EVERY 7 (SEVEN) DAYS.     Patient Active Problem List   Diagnosis Date Noted   Acute CHF (congestive heart failure) ([place]) 04/10/2021   Hypertensive emergency 04/09/2021   Acute on chronic diastolic CHF (congestive heart failure) ([place]) 04/09/2021   Acute respiratory failure ([place]) 04/09/2021   Nicotine dependence 04/09/2021   AKI (acute kidney injury) ([place]) 02/26/2021   Hypokalemia 02/26/2021   Hypomagnesemia 02/26/2021   Bruising 02/26/2021   Syncope and collapse 02/26/2021   Prolonged QT interval 02/26/2021   Uncontrolled type 2 diabetes with eye complications 16/02/9603   DM (diabetes mellitus), type 2 with peripheral vascular complications ([place]) 54/01/8118   Amputation of left foot ([place]) 12/02/2014   Benign essential HTN 12/02/2014   Depression, major, recurrent, mild ([place]) 12/02/2014   Dyslipidemia 12/02/2014   Vitamin B12 deficiency 12/02/2014   Peripheral neuropathic pain 12/02/2014   Peripheral artery disease ([place]) 12/02/2014   Phantom limb ([place]) 12/02/2014   Diabetes mellitus with nephropathy ([place]) 12/02/2014   Detached retina 12/02/2014   Type 2 diabetes mellitus with peripheral neuropathy ([place]) 12/02/2014   Tobacco use 12/02/2014   Urge incontinence 12/02/2014   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   Vitamin D deficiency 12/02/2014    Patient Care Plan: RN Care Manager Plan of Care     Problem Identified: HTN,DM, High Fall Risk      Long-Range Goal:  Disease Progression Prevented or Minimized   Start Date: 03/21/2021  Expected End Date: 06/19/2021  Priority: High  Note:   Current Barriers:  Chronic Disease Management support and education needs related to HTN, DMII, and High Fall Risk  RNCM Clinical Goal(s):  Patient will demonstrate ongoing adherence to prescribed treatment plan for HTN, DMII, and Fall Risk. Patient will work with [employer] care guide to address needs related to  Financial constraints related to nutrition and utilities through collaboration with Consulting civil engineer, provider, and care team.   Interventions: 1:1 collaboration with primary care provider regarding development and update of comprehensive plan of care as evidenced by provider attestation and co-signature Inter-disciplinary care team collaboration (see longitudinal plan of care) Evaluation of current treatment plan related to  self management and patient's adherence to plan as established by provider  Falls Interventions: Reviewed medications and discussed potential side effects such as dizziness, lightheadedness and frequent urination. Provided information regarding safety and fall prevention. Risk for falls is increased d/t impaired gait and impaired vision. Reports having a cane and walker available to use  as needed in the home. Reports currently receiving Home Health services. Advised to follow precautions and activity restrictions as instructed by the physical therapy team. Discussed ability to perform ADL's and tasks in the home. Currently lives alone.  Reports completing ADL's independently. Reports her brother stays occasionally to assist with task in the home and transportation. Advised to update the care management team of changes in functional status. Update on 04/12/21: Patient and her sister in law Lisa [name] requesting that additional outreach with [employer] for incontinence supplies. Reports not receiving a call back from the company. They are unable  to verify if she has missed calls due to current hospitalization. Communication/Request for update submitted as requested. Pending follow up from [employer] representative.     Wellness/Community Resource Interventions:  Patient is currently hospitalized at [facility]. Received message from inpatient staff regarding Lisa [name]'s request to be contacted in her hospital room. Discussed concerns regarding safely entering and exiting her home. A wheelchair ramp was previously offered but she declined. Advised that the care team would gladly contact the volunteer group if she is agreeable. She continues to decline. Prefers to have railing installed in a specific location. Advised that ramps/structures are required to meet mandated codes. She is aware that local volunteer groups may not be able to accommodate her request if it does not meet mandated building codes.   Discussed plan for meals and food resources. Reports being unable to verify if she will receive delivered meals. Advised that the community care guide has submitted her request and is aware of her current hospitalization. Advised that she will be contacted after discharge to discuss plan for meal delivery. Discussed concerns regarding not obtaining a mattress. A request was submitted to [place]. A mattress was available and able to be delivered to Lisa [name] within 48 hours. She declined the mattress. Indicated she would only accept it if the team could confirm that it was brand new. She was advised that the mattress was gently used. The furniture ministry will not be able to accommodate her request for a new/unused mattress at this time.  Discussed concerns regarding her utility bills not being paid as requested. Lisa [name] has not submitted the documents as requested by the [place]. Reports attempting to send then once but unable to confirm that they were submitted to the correct  person. She is requesting that the team contact her utility provider directly and pay all past due bills and fees. Thoroughly discussed process for assistance.  Advised Lisa [name]'s sister in law Lisa [name] to assist with obtaining the needed documents and submitting them to [employer] as previously requested. Advised that the care team will contact them with confirmation once the documents are received.  Conducted very thorough discussion with Lisa [name] and her sister in law Lisa [name] regarding requested resources and supplies. Lisa [name] reports experiencing financial strain and not receiving any financial support from family and friends. Both Lisa [name] and Lisa [name] are requesting that specified items/supplies be provided within a certain time frame at no cost. Both were advised that the care management team would continue to collaborate with agencies in the community regarding the request. However, we cannot confirm that each item requested will be available per her specifications at no cost. Both were informed that several requests were being forwarded to volunteer/charity organizations. Discussed reasonable time frames. Discussed importance of returning calls to the care management team and submitting required documents as requested to prevent unnecessary delays.  Patient Goals/Self-Care Activities: Take all  medications as prescribed Attend all scheduled provider appointments Contact pharmacy for medication refills Update the care management team with changes in functional status Complete needed documents and return to the [place] the clinic for questions and new concerns as needed   Follow Up Plan:   Pending hospital discharge/disposition     PLAN Follow up pending hospital discharge.   Cristy [facility] Care Management [facility] [phone]

## 2021-04-12 NOTE — Care Management Important Message (Signed)
Important Message  Patient Details  Name: Lisa [name] MRN: [id number] Date of Birth: 1946-01-02   Medicare Important Message Given:  Yes     [name] 04/12/2021, 2:24 PM

## 2021-04-12 NOTE — Progress Notes (Signed)
Pt arrived to the unit via stretcher and accompanied by ED RN. Pt A/Ox4 and on 2 L O2. Pt was able to slide from stretcher to bed independently. Skin check completed with 2nd second RN: no issues noted. Sacrum mepilex placed for protection since pt is incontinent of bowel and bladder. Pt noted to be soiled on transfer and staff assisted with incontinence care, purwick in place. Pt able to MAE. Pt stated she has partials upper and lower but they are at home. Belonging policy explained and py voiced understanding; see flow for belonging list. Snack given per request. Call light within reach, pt oriented to staff and unit, bed in low position with safety measures intact.

## 2021-04-12 NOTE — Progress Notes (Signed)
   Heart Failure Nurse Navigator Note  HFpEF 60-65%  Met with patient today, she was sitting up in the chair in no acute distress remained on O2 per nasal cannula.  I teach back method went over daily weights and what to report.  She states that she has been unable to read the heart failure teaching packet due to having lost her glasses.  Again touched on low-sodium and removing the salt Shaker from the table along with fluid restriction.  States that she does not have a scale at home, I have supplied her with a scale to take home.  She had no further questions.  Pricilla Riffle RN [facility]

## 2021-04-12 NOTE — Progress Notes (Addendum)
PHARMACY CONSULT NOTE - FOLLOW UP  Pharmacy Consult for Electrolyte Monitoring and Replacement   Recent Labs: Potassium (mmol/L)  Date Value  04/12/2021 3.9   Magnesium (mg/dL)  Date Value  24/26/8341 1.7   Calcium (mg/dL)  Date Value  96/22/2979 8.6 (L)   Albumin (g/dL)  Date Value  89/21/1941 2.6 (L)  09/25/2015 4.4   Phosphorus (mg/dL)  Date Value  74/12/1446 3.9   Sodium (mmol/L)  Date Value  04/12/2021 139  09/25/2015 142     Assessment: 75 yo female who came in with increasing shortness of breath and bilateral lower extremity swelling all of a sudden for two days. Pharmacy has been consulted for electrolyte replacement.   Pt on IV lasix 60 mg BID  Goal of Therapy:  Electrolytes WNL  Plan:  Scr: 0.56>0.62>0.73 K 3.1>3.9 - (after 40 mEq Kcl PO x 2 doses yesterday) No further repletion today. Other lytes WNL. No repletion req'd at this time Will CTM and replete PRN. Recheck electrolytes with AM labs  Martyn Malay ,PharmD Clinical Pharmacist 04/12/2021 8:03 AM

## 2021-04-12 NOTE — Patient Instructions (Signed)
Thank you for allowing the Chronic Care Management team to participate in your care.  

## 2021-04-12 NOTE — Evaluation (Signed)
Physical Therapy Evaluation Patient Details Name: Lisa [name] MRN: [id number] DOB: [birth year] Today's Date: 04/12/2021  History of Present Illness  Lisa [name] is a 75 y.o. female with medical history significant for depression, hypertension, diabetes mellitus, glaucoma, chronic diastolic dysfunction CHF who presents to the ER via EMS for evaluation of sudden onset shortness of breath and cough. Pt adm for acute hypoxic respiratory failure secondary to acute on chronic diastolic dysfunction with pulmonary edema.   Clinical Impression  Pt is a pleasant 75 year old female who presents to PT evaluation after diagnosis listed above. Prior to admission, pt reports being modified independent with mobility with either a SPC or RW and lives with her brother. Pt reports having HHPT prior to admission. Upon evaluation, pt requiring minA for bed mobility, CGA for sit to stand transfers, and SPV/SBA for ambulation with SPC. Prior to mobility, able to trial RA and pulse ox reading 85% however, unable to determine accuracy of reading. Attempted to obtain oxygen saturation on 2L oxygen during ambulation but unable after multiple attempts with multiple sources. Pt denying any shortness of breath with activity and demonstrates good functional endurance for ADL activities. Recommending home with HHPT at discharge. Pt will benefit from skilled PT services during acute admission to maximize ability to return to prior level of function and safety.     Recommendations for follow up therapy are one component of a multi-disciplinary discharge planning process, led by the attending physician.  Recommendations may be updated based on patient status, additional functional criteria and insurance authorization.  Follow Up Recommendations Home health PT    Assistance Recommended at Discharge Intermittent Supervision/Assistance  Functional Status Assessment Patient has had a recent decline in their functional status and  demonstrates the ability to make significant improvements in function in a reasonable and predictable amount of time.  Equipment Recommendations  None recommended by PT    Recommendations for Other Services       Precautions / Restrictions Precautions Precautions: Fall Restrictions Weight Bearing Restrictions: No      Mobility  Bed Mobility Overal bed mobility: Needs Assistance Bed Mobility: Supine to Sit     Supine to sit: Min assist     General bed mobility comments: Increased time to complete and requiring minA for trunk to upright position    Transfers Overall transfer level: Needs assistance Equipment used: Straight cane Transfers: Sit to/from Stand Sit to Stand: Min guard           General transfer comment: Increased time, cues for safety with [facility]    Ambulation/Gait Ambulation/Gait assistance: Supervision Gait Distance (Feet): 40 Feet Assistive device: Straight cane Gait Pattern/deviations: Step-through pattern;Decreased stride length       General Gait Details: Cautious with gait however, no balance noted throughout with usage of SPC  Stairs            Wheelchair Mobility    Modified Rankin (Stroke Patients Only)       Balance Overall balance assessment: Needs assistance Sitting-balance support: Feet supported Sitting balance-Leahy Scale: Normal     Standing balance support: Single extremity supported;During functional activity Standing balance-Leahy Scale: Good Standing balance comment: Good static standing balance for pericare with 1 UE assist; Good dynamic balance with usage of SPC                             Pertinent Vitals/Pain Pain Assessment: No/denies pain    Home Living Family/patient expects to  be discharged to:: Private residence Living Arrangements: Other relatives Available Help at Discharge: Family;Available PRN/intermittently Type of Home: House Home Access: Stairs to enter Entrance Stairs-Rails:  Right;Left;Can reach both Entrance Stairs-Number of Steps: 2   Home Layout: One level   Additional Comments: Pt reports living with her brother but he is unreliable and may only be there temporarily    Prior Function Prior Level of Function : Independent/Modified Independent             Mobility Comments: Usage of RW or SPC for ambulation ADLs Comments: Pt reports performing all basic ADLs and all HH IADLs I'ly/MOD I. States that she hasn't been driving for months now and calls family members for rides (cousins mostly)     Hand Dominance        Extremity/Trunk Assessment   Upper Extremity Assessment Upper Extremity Assessment: Defer to OT evaluation;Overall [facility] for tasks assessed    Lower Extremity Assessment Lower Extremity Assessment: Overall WFL for tasks assessed (Grossly 4/5 MMT; Hx of L foot transmet amputation)       Communication   Communication: No difficulties  Cognition Arousal/Alertness: Awake/alert Behavior During Therapy: WFL for tasks assessed/performed Overall Cognitive Status: Within Functional Limits for tasks assessed                                 General Comments: Difficult to motivate to participate with therapy but once agreeable very alert and oriented        General Comments General comments (skin integrity, edema, etc.): Unable to obtain accurate SpO2 reading throughout evaluation with multiple attempts via personal pulse ox, dinamap, and ear pulse oximeter. RN notified.    Exercises Other Exercises Other Exercises: Pt requiring additional time to complete two bowel movements during session. Pt with extended periods of standing at the sink to wash hands. Demonstrates good static and dynamic standing balance with bouts of no UE assistance   Assessment/Plan    PT Assessment Patient needs continued PT services  PT Problem List Decreased strength;Decreased balance;Decreased mobility;Decreased knowledge of use of DME;Decreased  safety awareness       PT Treatment Interventions DME instruction;Gait training;Functional mobility training;Stair training;Therapeutic activities;Therapeutic exercise;Balance training;Neuromuscular re-education;Patient/family education    PT Goals (Current goals can be found in the Care Plan section)  Acute Rehab PT Goals Patient Stated Goal: to go home PT Goal Formulation: With patient Time For Goal Achievement: 04/26/21 Potential to Achieve Goals: Good    Frequency Min 2X/week   Barriers to discharge        Co-evaluation               AM-PAC PT "6 Clicks" Mobility  Outcome Measure Help needed turning from your back to your side while in a flat bed without using bedrails?: None Help needed moving from lying on your back to sitting on the side of a flat bed without using bedrails?: A Little Help needed moving to and from a bed to a chair (including a wheelchair)?: A Little Help needed standing up from a chair using your arms (e.g., wheelchair or bedside chair)?: A Little Help needed to walk in hospital room?: A Little Help needed climbing 3-5 steps with a railing? : A Little 6 Click Score: 19    End of Session Equipment Utilized During Treatment: Gait belt;Oxygen Activity Tolerance: Patient tolerated treatment well Patient left: in chair;with call bell/phone within reach;with chair alarm set Nurse Communication: Mobility  status PT Visit Diagnosis: Unsteadiness on feet (R26.81);Muscle weakness (generalized) (M62.81)    Time: 0263-7858 PT Time Calculation (min) (ACUTE ONLY): 56 min   Charges:   PT Evaluation $PT Eval Low Complexity: 1 Low PT Treatments $Therapeutic Activity: 38-52 mins        Verl Blalock, SPT   Verl Blalock 04/12/2021, 1:43 PM

## 2021-04-12 NOTE — Plan of Care (Signed)
  Problem: Education: Goal: Knowledge of General Education information will improve Description Including pain rating scale, medication(s)/side effects and non-pharmacologic comfort measures Outcome: Progressing   Problem: Health Behavior/Discharge Planning: Goal: Ability to manage health-related needs will improve Outcome: Progressing   

## 2021-04-12 NOTE — Progress Notes (Signed)
[facility]  - [place] at [facility]   PATIENT NAME: Lisa [name]    MR#:  [id number]  DATE OF BIRTH:  02-Aug-1945  SUBJECTIVE:  came in with increasing shortness of breath bilateral lower extremity swelling all of a sudden for two days. No family at bedside.   Able to speak sentences completely without getting short of breath. Good urine output with IV Lasix. No cp  REVIEW OF SYSTEMS:   Review of Systems  Constitutional:  Negative for chills, fever and weight loss.  HENT:  Negative for ear discharge, ear pain and nosebleeds.   Eyes:  Negative for blurred vision, pain and discharge.  Respiratory:  Positive for shortness of breath. Negative for sputum production, wheezing and stridor.   Cardiovascular:  Positive for orthopnea, leg swelling and PND. Negative for chest pain and palpitations.  Gastrointestinal:  Negative for abdominal pain, diarrhea, nausea and vomiting.  Genitourinary:  Negative for frequency and urgency.  Musculoskeletal:  Negative for back pain and joint pain.  Neurological:  Positive for weakness. Negative for sensory change, speech change and focal weakness.  Psychiatric/Behavioral:  Negative for depression and hallucinations. The patient is not nervous/anxious.   Tolerating Diet:yes Tolerating PT: HH  DRUG ALLERGIES:   Allergies  Allergen Reactions   Other Itching    Nucinta   Tapentadol Hcl     VITALS:  Blood pressure (!) 160/73, pulse 67, temperature 97.9 F (36.6 C), resp. rate 18, height 5\' 4"  (1.626 m), weight 64 kg, SpO2 91 %.  PHYSICAL EXAMINATION:   Physical Exam  GENERAL:  75 y.o.-year-old patient lying in the bed with  mild acute distress.  HEENT: Head atraumatic, normocephalic. Oropharynx and nasopharynx clear. JVD+ LUNGS: decreased breath sounds bilaterally, no wheezing, +rales,  no rhonchi. No use of accessory muscles of respiration.  CARDIOVASCULAR: S1, S2 normal. No murmurs, rubs, or gallops.  ABDOMEN: Soft,  nontender, nondistended. Bowel sounds present. No organomegaly or mass.  EXTREMITIES: +edema b/l.    NEUROLOGIC: nonfocal PSYCHIATRIC:  patient is alert and oriented x 3.  SKIN: No obvious rash, lesion, or ulcer.   LABORATORY PANEL:  CBC Recent Labs  Lab 04/12/21 0812  WBC 5.7  HGB 9.5*  HCT 28.9*  PLT 246     Chemistries  Recent Labs  Lab 04/12/21 0523  NA 139  K 3.9  CL 100  CO2 30  GLUCOSE 172*  BUN 18  CREATININE 0.73  CALCIUM 8.6*  MG 1.7    Cardiac Enzymes No results for input(s): TROPONINI in the last 168 hours. RADIOLOGY:  No results found. ASSESSMENT AND PLAN:  Lisa [name] is a 75 y.o. female with medical history significant for depression, hypertension, diabetes mellitus, glaucoma, chronic diastolic dysfunction CHF who presents to the ER via EMS for evaluation of sudden onset shortness of breath and cough. Patient states that she was in her usual state of health until yesterday evening when she developed shortness of breath and a headache which she rarely gets. Patient also has bilateral lower extremity edema  acute hypoxic respiratory failure secondary to acute on chronic diastolic dysfunction with pulmonary edema malignant hypertension -- patient was on BiPAP now on 5 L nasal cannula oxygen. -- IV Lasix 40 mg qd-- will increase to IV Lasix 60 mg BID -- currently on IV nitroglycerin drip -- continue metoprolol 25 BID, losartan -- monitor input output -- urine output so for 2500 mL -- recent echo from October 2022 noted. Patient has EF of 60 to 65% with  grade 2 diastolic dysfunction --[id number] iv nitro gtt, Good uop ~5100 cc, SOB improved --11/18--UOP>6 liters, on 2L/[place] --IV lasix 40 mg bid. --per Dr Corky Sox-- patient declined cardiac cath  malignant hypertension -- titrated nitroglycerin drip to off -- metoprolol, losartan  Hypokalemia, hypomagnesimia -- pharmacy to replete  Diabetes II with vision loss -- continue sliding scale insulin for  now  chronic depression -- continue Celexa  tobacco abuse -- nicotine patch   Family communication : spoke with SIL Nadine and updated Consults : cardiology CODE STATUS: full DVT Prophylaxis : Lovenox Level of care: Telemetry Cardiac Status is: Inpatient  Remains inpatient appropriate because: CHF  PT/OT recommends [facility] anticipate discharge 1 to 2 days.       TOTAL TIME TAKING CARE OF THIS PATIENT: 25 minutes.  >50% time spent on counselling and coordination of care  Note: This dictation was prepared with Dragon dictation along with smaller phrase technology. Any transcriptional errors that result from this process are unintentional.  Fritzi Mandes M.D    [facility]   CC: Primary care physician; Steele Sizer, MD [name], female   DOB: 25-Oct-1945, 75 y.o.   MRN: [id number]

## 2021-04-13 DIAGNOSIS — I5033 Acute on chronic diastolic (congestive) heart failure: Secondary | ICD-10-CM | POA: Diagnosis not present

## 2021-04-13 LAB — BASIC METABOLIC PANEL
Anion gap: 8 (ref 5–15)
BUN: 17 mg/dL (ref 8–23)
CO2: 31 mmol/L (ref 22–32)
Calcium: 8.9 mg/dL (ref 8.9–10.3)
Chloride: 98 mmol/L (ref 98–111)
Creatinine, Ser: 0.7 mg/dL (ref 0.44–1.00)
GFR, Estimated: >60 mL/min (ref >60)
Glucose, Bld: 185 mg/dL — ABNORMAL HIGH (ref 70–99)
Potassium: 4.2 mmol/L (ref 3.5–5.1)
Sodium: 137 mmol/L (ref 135–145)

## 2021-04-13 LAB — GLUCOSE, CAPILLARY
Glucose-Capillary: 163 mg/dL — ABNORMAL HIGH (ref 70–99)
Glucose-Capillary: 140 mg/dL — ABNORMAL HIGH (ref 70–99)

## 2021-04-13 LAB — MAGNESIUM: Magnesium: 1.8 mg/dL (ref 1.7–2.4)

## 2021-04-13 LAB — PHOSPHORUS: Phosphorus: 4.1 mg/dL (ref 2.5–4.6)

## 2021-04-13 MED ORDER — FUROSEMIDE 40 MG PO TABS
40.0000 mg | ORAL_TABLET | Freq: Every day | ORAL | Status: DC
  Administered 2021-04-13: 40 mg via ORAL
  Filled 2021-04-13: qty 1

## 2021-04-13 MED ORDER — METOPROLOL SUCCINATE ER 50 MG PO TB24: 50.0000 mg | ORAL_TABLET | Freq: Every day | ORAL | 11 refills | Status: DC

## 2021-04-13 MED ORDER — POTASSIUM CHLORIDE CRYS ER 20 MEQ PO TBCR: 20.0000 meq | EXTENDED_RELEASE_TABLET | Freq: Every day | ORAL | 1 refills | Status: DC

## 2021-04-13 MED ORDER — FUROSEMIDE 40 MG PO TABS: 40.0000 mg | ORAL_TABLET | Freq: Every day | ORAL | 1 refills | Status: DC

## 2021-04-13 MED ORDER — NITROGLYCERIN 0.4 MG SL SUBL: 0.4000 mg | SUBLINGUAL_TABLET | SUBLINGUAL | 12 refills | Status: AC | PRN

## 2021-04-13 NOTE — Plan of Care (Signed)
  Problem: Health Behavior/Discharge Planning: Goal: Ability to manage health-related needs will improve Outcome: Adequate for Discharge Patient discharged to go home. Family will pick up this afternoon

## 2021-04-13 NOTE — Progress Notes (Signed)
[facility] Cardiology    SUBJECTIVE:   - Feels better today. Back on RA - NO chest pain or shortness of breath. Edema resolved.    Vitals:   04/13/21 0038 04/13/21 0310 04/13/21 0443 04/13/21 0800  BP: (!) 129/57 115/69  (!) 159/66  Pulse: 64 65  65  Resp: 20 16  18   Temp: 99 F (37.2 C) 97.9 F (36.6 C)  97.9 F (36.6 C)  TempSrc:      SpO2: 98% 94%  100%  Weight:   61 kg   Height:         Intake/Output Summary (Last 24 hours) at 04/13/2021 0843 Last data filed at 04/12/2021 2245 Gross per 24 hour  Intake 360 ml  Output 500 ml  Net -140 ml       PHYSICAL EXAM  General: Well developed, well nourished, in no acute distress HEENT:  Normocephalic and atramatic Neck:  No JVD.  Lungs: Clear bilaterally to auscultation and percussion. Heart: HRRR . Normal S1 and S2 without gallops or murmurs.  Abdomen: Bowel sounds are positive, abdomen soft and non-tender  Msk:  Back normal, normal gait. Normal strength and tone for age. Extremities: No clubbing, cyanosis or edema.   Neuro: Alert and oriented X 3. Psych:  Good affect, responds appropriately   LABS: Basic Metabolic Panel: Recent Labs    04/12/21 0523 04/13/21 0417  NA 139 137  K 3.9 4.2  CL 100 98  CO2 30 31  GLUCOSE 172* 185*  BUN 18 17  CREATININE 0.73 0.70  CALCIUM 8.6* 8.9  MG 1.7 1.8  PHOS  --  4.1    Liver Function Tests: No results for input(s): AST, ALT, ALKPHOS, BILITOT, PROT, ALBUMIN in the last 72 hours. No results for input(s): LIPASE, AMYLASE in the last 72 hours. CBC: Recent Labs    04/12/21 0812  WBC 5.7  HGB 9.5*  HCT 28.9*  MCV 86.3  PLT 246    Cardiac Enzymes: No results for input(s): CKTOTAL, CKMB, CKMBINDEX, TROPONINI in the last 72 hours. BNP: Invalid input(s): POCBNP D-Dimer: No results for input(s): DDIMER in the last 72 hours. Hemoglobin A1C: No results for input(s): HGBA1C in the last 72 hours. Fasting Lipid Panel: No results for input(s): CHOL, HDL, LDLCALC, TRIG,  CHOLHDL, LDLDIRECT in the last 72 hours. Thyroid Function Tests: No results for input(s): TSH, T4TOTAL, T3FREE, THYROIDAB in the last 72 hours.  Invalid input(s): FREET3 Anemia Panel: No results for input(s): VITAMINB12, FOLATE, FERRITIN, TIBC, IRON, RETICCTPCT in the last 72 hours.  No results found.   Echo preserved left ventricular function ejection fraction of around 60%  TELEMETRY: Normal sinus rhythm rate of about 80 nonspecific findings:  ASSESSMENT AND PLAN:  Principal Problem:   Acute on chronic diastolic CHF (congestive heart failure) (HCC) Active Problems:   Benign essential HTN   Depression, major, recurrent, mild (HCC)   DM (diabetes mellitus), type 2 with peripheral vascular complications (HCC)   Hypokalemia   Hypomagnesemia   Hypertensive emergency   Acute respiratory failure (HCC)   Nicotine dependence   Acute CHF (congestive heart failure) (HCC) Peripheral vascular disease with amputations of toes  Plan She was admitted with acute flash pulmonary edema in the setting of hypertensive emergency and diastolic heart failure.  She is improved dramatically with diuresis and reinitiation of her blood pressure medications. - Continue telemetry level care, though she appears ready for discharge.  - Supplemental oxygen inhalers as necessary -Continue diuresis today- transition lasix 40 mg  daily -Continue losartan and metoprolol - DVT prophylaxis no need for ACS anticoagulation but will consider Plavix and aspirin - Consider increase activity with physical therapy -Patient declines cardiac cath and states that she does not "want anyone cutting on her."  She will discuss this at follow-up with Dr. Juliann Pares. - f/u with cardiologist in 1-2 weeks after discharge.    Armando Reichert, MD 04/13/2021 8:43 AM

## 2021-04-13 NOTE — Progress Notes (Signed)
Nsg Discharge Note  Admit Date:  04/09/2021 Discharge date: 04/13/2021   Geoffery Lyons to be D/C'd Home per MD order.  AVS completed.  Patient/caregiver able to verbalize understanding.  Discharge Medication: Allergies as of 04/13/2021       Reactions   Other Itching   Nucinta   Tapentadol Hcl         Medication List     STOP taking these medications    amLODipine 2.5 MG tablet Commonly known as: NORVASC       TAKE these medications    Accu-Chek Aviva Connect w/Device Kit 1 each by Does not apply route daily. And supplies E11.9 check fsbs bid   aspirin 81 MG tablet Take 81 mg by mouth daily.   atropine 1 % ophthalmic solution Place 1 drop into the right eye 3 (three) times daily.   B-12 1000 MCG Subl Place 1 tablet under the tongue daily.   blood glucose meter kit and supplies Dispense based on patient and insurance preference. Use up to four times daily as directed. (FOR ICD-10 E10.9, E11.9).   brimonidine 0.2 % ophthalmic solution Commonly known as: ALPHAGAN Place 1 drop into both eyes 2 (two) times daily.   citalopram 20 MG tablet Commonly known as: CELEXA Take 1 tablet (20 mg total) by mouth daily.   clopidogrel 75 MG tablet Commonly known as: PLAVIX Take 1 tablet (75 mg total) by mouth daily.   dorzolamide-timolol 22.3-6.8 MG/ML ophthalmic solution Commonly known as: COSOPT SMARTSIG:In Eye(s)   furosemide 40 MG tablet Commonly known as: LASIX Take 1 tablet (40 mg total) by mouth daily.   Januvia 100 MG tablet Generic drug: sitaGLIPtin TAKE 1 TABLET BY MOUTH EVERY DAY   losartan 100 MG tablet Commonly known as: COZAAR Take 1 tablet (100 mg total) by mouth daily.   Metamucil 0.52 g capsule Generic drug: psyllium Take 1 capsule (0.52 g total) by mouth daily.   metoprolol succinate 50 MG 24 hr tablet Commonly known as: Toprol XL Take 1 tablet (50 mg total) by mouth daily. Take with or immediately following a meal.   multivitamin  tablet Take 1 tablet by mouth daily.   nitroGLYCERIN 0.4 MG SL tablet Commonly known as: NITROSTAT Place 1 tablet (0.4 mg total) under the tongue every 5 (five) minutes as needed for chest pain.   pioglitazone 15 MG tablet Commonly known as: Actos Take 1 tablet (15 mg total) by mouth daily.   potassium chloride SA 20 MEQ tablet Commonly known as: KLOR-CON Take 1 tablet (20 mEq total) by mouth daily. What changed:  medication strength how much to take when to take this   prednisoLONE acetate 1 % ophthalmic suspension Commonly known as: PRED FORTE Place into the right eye.   rosuvastatin 10 MG tablet Commonly known as: CRESTOR Take 1 tablet (10 mg total) by mouth daily.   Vitamin D (Ergocalciferol) 1.25 MG (50000 UNIT) Caps capsule Commonly known as: DRISDOL TAKE 1 CAPSULE (50,000 UNITS TOTAL) BY MOUTH EVERY 7 (SEVEN) DAYS.        Discharge Assessment: Vitals:   04/13/21 0800 04/13/21 1128  BP: (!) 159/66 135/67  Pulse: 65 66  Resp: 18 18  Temp: 97.9 F (36.6 C) 98.9 F (37.2 C)  SpO2: 100%    Skin clean, dry and intact without evidence of skin break down, no evidence of skin tears noted. IV catheter discontinued intact. Site without signs and symptoms of complications - no redness or edema noted at insertion site, patient  denies c/o pain - only slight tenderness at site.  Dressing with slight pressure applied.  D/c Instructions-Education: Discharge instructions given to patient/family with verbalized understanding. D/c education completed with patient/family including follow up instructions, medication list, d/c activities limitations if indicated, with other d/c instructions as indicated by MD - patient able to verbalize understanding, all questions fully answered. Patient instructed to return to ED, call 911, or call MD for any changes in condition.  Patient escorted via [place], and D/C home via private auto.  Dodge Ator, Jolene Schimke, RN 04/13/2021 12:52 PM

## 2021-04-13 NOTE — TOC Transition Note (Signed)
Transition of Care [facility]) - CM/SW Discharge Note   Patient Details  Name: Lisa [name] MRN: [id number] Date of Birth: 1945-06-22  Transition of Care [facility]) CM/SW Contact:  Gildardo Griffes, LCSW Phone Number: 04/13/2021, 9:33 AM   Clinical Narrative:     Patient active with [facility] for PT and RN. Barbara Cower with [facility] informed of patient discharge today. Per MD patient was able to wean off of O2 during hospital stay and will not be discharged on O2. No DME needs identified.   TOC signing off.    Final next level of care: Home w Home Health Services Barriers to Discharge: No Barriers Identified   Patient Goals and CMS Choice Patient states their goals for this hospitalization and ongoing recovery are:: to go home CMS Medicare.gov Compare Post Acute Care list provided to:: Patient Choice offered to / list presented to : Patient  Discharge Placement PASRR number recieved: 04/13/21                  Patient and family notified of of transfer: 04/13/21  Discharge Plan and Services In-house Referral: Clinical Social Work   Post Acute Care Choice: Home Health (Active with [facility])                    HH Arranged: PT, RN HH Agency: [facility] Date HH Agency Contacted: 04/13/21 Time HH Agency Contacted: [phone] Representative spoke with at [facility] Agency: Barbara Cower  Social Determinants of Health (SDOH) Interventions     Readmission Risk Interventions No flowsheet data found.

## 2021-04-13 NOTE — Discharge Summary (Signed)
[facility] [place] [facility]   PATIENT NAME: Lisa [name]    MR#:  [id number]  DATE OF BIRTH:  [birth year]  DATE OF ADMISSION:  04/09/2021 ADMITTING PHYSICIAN: Enedina Finner, MD  DATE OF DISCHARGE: 04/13/2021  PRIMARY CARE PHYSICIAN: Alba Cory, MD    ADMISSION DIAGNOSIS:  Acute CHF (congestive heart failure) (HCC) [I50.9] Acute respiratory failure with hypoxia (HCC) [J96.01] Hypertensive emergency [I16.1] Acute on chronic congestive heart failure, unspecified heart failure type (HCC) [I50.9]  DISCHARGE DIAGNOSIS:  Acute Hypoxic respiratory failure/Acute pulmonary edema/acute/chronic CHF-diastolic Malignant HTN  SECONDARY DIAGNOSIS:   Past Medical History:  Diagnosis Date   Blindness of right eye    Depression    Diabetes mellitus without complication (HCC)    DVT of leg (deep venous thrombosis) (HCC)    Glaucoma    Hyperlipidemia    Hypertension    Vascular disease     HOSPITAL COURSE:   hypertension, diabetes mellitus, glaucoma, chronic diastolic dysfunction CHF who presents to the ER via EMS for evaluation of sudden onset shortness of breath and cough. Patient states that she was in her usual state of health until yesterday evening when she developed shortness of breath and a headache which she rarely gets. Patient also has bilateral lower extremity edema   Acute hypoxic respiratory failure secondary to acute on chronic diastolic dysfunction with Acute pulmonary edema Malignant hypertension -- patient was on BiPAP now on 5 L nasal cannula oxygen. -- IV Lasix 40 mg qd-- will increase to IV Lasix 60 mg BID -- currently on IV nitroglycerin drip -- continue metoprolol 25 BID, losartan -- monitor input output -- urine output so for 2500 mL -- recent echo from October 2022 noted. Patient has EF of 60 to 65% with grade 2 diastolic dysfunction --11/17--off iv nitro gtt, Good uop ~5100 cc, SOB improved --11/18--UOP>6 liters, on 2L/[place] --IV  lasix 40 mg bid. --per Dr Beatrix Fetters-- patient declined cardiac cath --11/19 appears stable, sats 99% on RA. Change to po lasix 40 mg qd. F/u [facility] cards as out pt   malignant hypertension -- titrated nitroglycerin drip to off -- metoprolol, losartan   Hypokalemia, hypomagnesimia -- stable   Diabetes II with vision loss -- continue sliding scale insulin for now --resumed home meds at d/c   chronic depression -- continue Celexa   tobacco abuse -- nicotine patch  PAD  --pt follows with Dr Wyn Quaker   Family communication : spoke with SIL Nadine and updated Consults : cardiology CODE STATUS: full DVT Prophylaxis : Lovenox Level of care: Telemetry Cardiac Status is: Inpatient   Remains inpatient appropriate because: CHF   PT/OT recommends [facility]  discharge home today   CONSULTS OBTAINED:    DRUG ALLERGIES:   Allergies  Allergen Reactions   Other Itching    Nucinta   Tapentadol Hcl     DISCHARGE MEDICATIONS:   Allergies as of 04/13/2021       Reactions   Other Itching   Nucinta   Tapentadol Hcl         Medication List     STOP taking these medications    amLODipine 2.5 MG tablet Commonly known as: NORVASC   potassium chloride 10 MEQ tablet Commonly known as: KLOR-CON       TAKE these medications    Accu-Chek Aviva Connect w/Device Kit 1 each by Does not apply route daily. And supplies E11.9 check fsbs bid   aspirin 81 MG tablet Take 81 mg by mouth daily.  atropine 1 % ophthalmic solution Place 1 drop into the right eye 3 (three) times daily.   B-12 1000 MCG Subl Place 1 tablet under the tongue daily.   blood glucose meter kit and supplies Dispense based on patient and insurance preference. Use up to four times daily as directed. (FOR ICD-10 E10.9, E11.9).   brimonidine 0.2 % ophthalmic solution Commonly known as: ALPHAGAN Place 1 drop into both eyes 2 (two) times daily.   citalopram 20 MG tablet Commonly known as: CELEXA Take 1 tablet (20 mg  total) by mouth daily.   clopidogrel 75 MG tablet Commonly known as: PLAVIX Take 1 tablet (75 mg total) by mouth daily.   dorzolamide-timolol 22.3-6.8 MG/ML ophthalmic solution Commonly known as: COSOPT SMARTSIG:In Eye(s)   furosemide 40 MG tablet Commonly known as: LASIX Take 1 tablet (40 mg total) by mouth daily.   Januvia 100 MG tablet Generic drug: sitaGLIPtin TAKE 1 TABLET BY MOUTH EVERY DAY   losartan 100 MG tablet Commonly known as: COZAAR Take 1 tablet (100 mg total) by mouth daily.   Metamucil 0.52 g capsule Generic drug: psyllium Take 1 capsule (0.52 g total) by mouth daily.   metoprolol succinate 50 MG 24 hr tablet Commonly known as: Toprol XL Take 1 tablet (50 mg total) by mouth daily. Take with or immediately following a meal.   multivitamin tablet Take 1 tablet by mouth daily.   nitroGLYCERIN 0.4 MG SL tablet Commonly known as: NITROSTAT Place 1 tablet (0.4 mg total) under the tongue every 5 (five) minutes as needed for chest pain.   pioglitazone 15 MG tablet Commonly known as: Actos Take 1 tablet (15 mg total) by mouth daily.   prednisoLONE acetate 1 % ophthalmic suspension Commonly known as: PRED FORTE Place into the right eye.   rosuvastatin 10 MG tablet Commonly known as: CRESTOR Take 1 tablet (10 mg total) by mouth daily.   Vitamin D (Ergocalciferol) 1.25 MG (50000 UNIT) Caps capsule Commonly known as: DRISDOL TAKE 1 CAPSULE (50,000 UNITS TOTAL) BY MOUTH EVERY 7 (SEVEN) DAYS.        If you experience worsening of your admission symptoms, develop shortness of breath, life threatening emergency, suicidal or homicidal thoughts you must seek medical attention immediately by calling 911 or calling your MD immediately  if symptoms less severe.  You Must read complete instructions/literature along with all the possible adverse reactions/side effects for all the Medicines you take and that have been prescribed to you. Take any new Medicines after  you have completely understood and accept all the possible adverse reactions/side effects.   Please note  You were cared for by a hospitalist during your hospital stay. If you have any questions about your discharge medications or the care you received while you were in the hospital after you are discharged, you can call the unit and asked to speak with the hospitalist on call if the hospitalist that took care of you is not available. Once you are discharged, your primary care physician will handle any further medical issues. Please note that NO REFILLS for any discharge medications will be authorized once you are discharged, as it is imperative that you return to your primary care physician (or establish a relationship with a primary care physician if you do not have one) for your aftercare needs so that they can reassess your need for medications and monitor your lab values. Today   SUBJECTIVE  Breathing better Sats 99% RA   VITAL SIGNS:  Blood pressure Marland Kitchen)  159/66, pulse 65, temperature 97.9 F (36.6 C), resp. rate 18, height $RemoveBe'5\' 4"'KUCaysadM$  (1.626 m), weight 61 kg, SpO2 100 %.  I/O:   Intake/Output Summary (Last 24 hours) at 04/13/2021 0926 Last data filed at 04/12/2021 2245 Gross per 24 hour  Intake 360 ml  Output 500 ml  Net -140 ml    PHYSICAL EXAMINATION:  GENERAL:  75 y.o.-year-old patient lying in the bed with no acute distress.  LUNGS: Normal breath sounds bilaterally, no wheezing, rales,rhonchi or crepitation. No use of accessory muscles of respiration.  CARDIOVASCULAR: S1, S2 normal. No murmurs, rubs, or gallops.  ABDOMEN: Soft, non-tender, non-distended. Bowel sounds present. No organomegaly or mass.  EXTREMITIES: trace edema Left foot transmetatarsal amputation NEUROLOGIC: non-focal PSYCHIATRIC:  patient is alert and oriented x 3.  SKIN: No obvious rash, lesion, or ulcer.   DATA REVIEW:   CBC  Recent Labs  Lab 04/12/21 0812  WBC 5.7  HGB 9.5*  HCT 28.9*  PLT 246     Chemistries  Recent Labs  Lab 04/13/21 0417  NA 137  K 4.2  CL 98  CO2 31  GLUCOSE 185*  BUN 17  CREATININE 0.70  CALCIUM 8.9  MG 1.8    Microbiology Results   Recent Results (from the past 240 hour(s))  Resp Panel by RT-PCR (Flu A&B, Covid) Nasopharyngeal Swab     Status: None   Collection Time: 04/09/21  3:36 AM   Specimen: Nasopharyngeal Swab; Nasopharyngeal(NP) swabs in vial transport medium  Result Value Ref Range Status   SARS Coronavirus 2 by RT PCR NEGATIVE NEGATIVE Final    Comment: (NOTE) SARS-CoV-2 target nucleic acids are NOT DETECTED.  The SARS-CoV-2 RNA is generally detectable in upper respiratory specimens during the acute phase of infection. The lowest concentration of SARS-CoV-2 viral copies this assay can detect is 138 copies/mL. A negative result does not preclude SARS-Cov-2 infection and should not be used as the sole basis for treatment or other patient management decisions. A negative result may occur with  improper specimen collection/handling, submission of specimen other than nasopharyngeal swab, presence of viral mutation(s) within the areas targeted by this assay, and inadequate number of viral copies(<138 copies/mL). A negative result must be combined with clinical observations, patient history, and epidemiological information. The expected result is Negative.  Fact Sheet for Patients:  EntrepreneurPulse.com.au  Fact Sheet for Healthcare Providers:  IncredibleEmployment.be  This test is no t yet approved or cleared by the [place] FDA and  has been authorized for detection and/or diagnosis of SARS-CoV-2 by FDA under an Emergency Use Authorization (EUA). This EUA will remain  in effect (meaning this test can be used) for the duration of the COVID-19 declaration under Section 564(b)(1) of the Act, 21 U.S.C.section 360bbb-3(b)(1), unless the authorization is terminated  or revoked sooner.        Influenza A by PCR NEGATIVE NEGATIVE Final   Influenza B by PCR NEGATIVE NEGATIVE Final    Comment: (NOTE) The Xpert Xpress SARS-CoV-2/FLU/RSV plus assay is intended as an aid in the diagnosis of influenza from Nasopharyngeal swab specimens and should not be used as a sole basis for treatment. Nasal washings and aspirates are unacceptable for Xpert Xpress SARS-CoV-2/FLU/RSV testing.  Fact Sheet for Patients: EntrepreneurPulse.com.au  Fact Sheet for Healthcare Providers: IncredibleEmployment.be  This test is not yet approved or cleared by the [place] FDA and has been authorized for detection and/or diagnosis of SARS-CoV-2 by FDA under an Emergency Use Authorization (EUA). This EUA will remain  in effect (meaning this test can be used) for the duration of the COVID-19 declaration under Section 564(b)(1) of the Act, 21 U.S.C. section 360bbb-3(b)(1), unless the authorization is terminated or revoked.  Performed at [facility], [address], [place], [place] [address]     RADIOLOGY:  No results found.   CODE STATUS:     Code Status Orders  (From admission, onward)           Start     Ordered   04/09/21 1034  Full code  Continuous        04/09/21 1034           Code Status History     Date Active Date Inactive Code Status Order ID Comments User Context   02/26/2021 0305 03/03/2021 1849 Full Code [id number]  Para Skeans, MD ED        TOTAL TIME TAKING CARE OF THIS PATIENT: 40 minutes.    Fritzi Mandes M.D  [facility]    CC: Primary care physician; Steele Sizer, MD

## 2021-04-15 ENCOUNTER — Ambulatory Visit: Payer: Self-pay

## 2021-04-15 ENCOUNTER — Telehealth: Payer: Self-pay

## 2021-04-15 ENCOUNTER — Ambulatory Visit: Payer: Self-pay | Admitting: *Deleted

## 2021-04-15 ENCOUNTER — Telehealth: Payer: 59 | Admitting: *Deleted

## 2021-04-15 DIAGNOSIS — Z789 Other specified health status: Secondary | ICD-10-CM

## 2021-04-15 DIAGNOSIS — E1159 Type 2 diabetes mellitus with other circulatory complications: Secondary | ICD-10-CM

## 2021-04-15 DIAGNOSIS — I152 Hypertension secondary to endocrine disorders: Secondary | ICD-10-CM

## 2021-04-15 DIAGNOSIS — E119 Type 2 diabetes mellitus without complications: Secondary | ICD-10-CM

## 2021-04-15 DIAGNOSIS — Z9181 History of falling: Secondary | ICD-10-CM

## 2021-04-15 NOTE — Telephone Encounter (Signed)
   Telephone encounter was:  Successful.  04/15/2021 Name: Genifer Lazenby MRN: [id number] DOB: 05-04-1946  Taia Bramlett is a 75 y.o. year old female who is a primary care patient of Alba Cory, MD . The community resource team was consulted for assistance with Spoke with patient's sister-in-law confirmed that copies of her [employer] and [place] [employer] need to be emailed to [email or web address].  I will let her know when I receive them.  Spoke with Britta Mccreedy at [employer] gave her contact information for patient's sister-in-law Rena.  She is trying to get the patient added to the delivery route.  Care guide performed the following interventions: Follow up call placed to community resources to determine status of patients referral Follow up call placed to the patient to discuss status of referral.  Follow Up Plan:  Care guide will follow up with patient by phone over the next 7-10 days.  Juneau Doughman, AAS Paralegal, [facility] Care Guide  Embedded Care Coordination [place]  Care Management  [address] [place], [place] [address] ??[email or web address]  ?? [phone]   [email or web address]

## 2021-04-15 NOTE — Chronic Care Management (AMB) (Signed)
Chronic Care Management    Clinical Social Work Note  04/15/2021 Name: Alecea Trego MRN: [id number] DOB: 07/31/1945  Sonam Wandel is a 75 y.o. year old female who is a primary care patient of Steele Sizer, MD. The CCM team was consulted to assist the patient with chronic disease management and/or care coordination needs related to: [employer] .   Collaboration with CCM RNCM  for  update on services offered/provided  in response to provider referral for social work chronic care management and care coordination services.   Consent to Services:  The patient was given information about Chronic Care Management services, agreed to services, and gave verbal consent prior to initiation of services.  Please see initial visit note for detailed documentation.   Patient agreed to services and consent obtained.   Assessment: Review of patient past medical history, allergies, medications, and health status, including review of relevant consultants reports was performed today as part of a comprehensive evaluation and provision of chronic care management and care coordination services.     SDOH (Social Determinants of Health) assessments and interventions performed:    Advanced Directives Status: Not addressed in this encounter.  CCM Care Plan  Allergies  Allergen Reactions   Other Itching    Nucinta   Tapentadol Hcl     Outpatient Encounter Medications as of 04/15/2021  Medication Sig Note   aspirin 81 MG tablet Take 81 mg by mouth daily.    atropine 1 % ophthalmic solution Place 1 drop into the right eye 3 (three) times daily. 02/26/2021: Last dispensed: 12/31/2020 for 34d supply    blood glucose meter kit and supplies Dispense based on patient and insurance preference. Use up to four times daily as directed. (FOR ICD-10 E10.9, E11.9).    Blood Glucose Monitoring Suppl (ACCU-CHEK AVIVA CONNECT) w/Device KIT 1 each by Does not apply route daily. And supplies E11.9 check fsbs bid     brimonidine (ALPHAGAN) 0.2 % ophthalmic solution Place 1 drop into both eyes 2 (two) times daily. 02/26/2021: Last dispensed: 12/31/2020 for 50d supply   citalopram (CELEXA) 20 MG tablet Take 1 tablet (20 mg total) by mouth daily.    clopidogrel (PLAVIX) 75 MG tablet Take 1 tablet (75 mg total) by mouth daily.    Cyanocobalamin (B-12) 1000 MCG SUBL Place 1 tablet under the tongue daily.    dorzolamide-timolol (COSOPT) 22.3-6.8 MG/ML ophthalmic solution SMARTSIG:In Eye(s)    furosemide (LASIX) 40 MG tablet Take 1 tablet (40 mg total) by mouth daily.    JANUVIA 100 MG tablet TAKE 1 TABLET BY MOUTH EVERY DAY    losartan (COZAAR) 100 MG tablet Take 1 tablet (100 mg total) by mouth daily.    metoprolol succinate (TOPROL XL) 50 MG 24 hr tablet Take 1 tablet (50 mg total) by mouth daily. Take with or immediately following a meal.    Multiple Vitamin (MULTIVITAMIN) tablet Take 1 tablet by mouth daily.    nitroGLYCERIN (NITROSTAT) 0.4 MG SL tablet Place 1 tablet (0.4 mg total) under the tongue every 5 (five) minutes as needed for chest pain.    pioglitazone (ACTOS) 15 MG tablet Take 1 tablet (15 mg total) by mouth daily.    potassium chloride (KLOR-CON) 20 MEQ tablet Take 1 tablet (20 mEq total) by mouth daily.    prednisoLONE acetate (PRED FORTE) 1 % ophthalmic suspension Place into the right eye.    psyllium (METAMUCIL) 0.52 g capsule Take 1 capsule (0.52 g total) by mouth daily. 04/05/2021: Reports receiving metamucil  powder at pharmacy.   rosuvastatin (CRESTOR) 10 MG tablet Take 1 tablet (10 mg total) by mouth daily.    Vitamin D, Ergocalciferol, (DRISDOL) 1.25 MG (50000 UNIT) CAPS capsule TAKE 1 CAPSULE (50,000 UNITS TOTAL) BY MOUTH EVERY 7 (SEVEN) DAYS.    No facility-administered encounter medications on file as of 04/15/2021.    Patient Active Problem List   Diagnosis Date Noted   Acute CHF (congestive heart failure) ([place]) 04/10/2021   Hypertensive emergency 04/09/2021   Acute on chronic  diastolic CHF (congestive heart failure) ([place]) 04/09/2021   Acute respiratory failure ([place]) 04/09/2021   Nicotine dependence 04/09/2021   AKI (acute kidney injury) ([place]) 02/26/2021   Hypokalemia 02/26/2021   Hypomagnesemia 02/26/2021   Bruising 02/26/2021   Syncope and collapse 02/26/2021   Prolonged QT interval 02/26/2021   Uncontrolled type 2 diabetes with eye complications 54/65/0354   DM (diabetes mellitus), type 2 with peripheral vascular complications ([place]) 65/68/1275   Amputation of left foot ([place]) 12/02/2014   Benign essential HTN 12/02/2014   Depression, major, recurrent, mild ([place]) 12/02/2014   Dyslipidemia 12/02/2014   Vitamin B12 deficiency 12/02/2014   Peripheral neuropathic pain 12/02/2014   Peripheral artery disease ([place]) 12/02/2014   Phantom limb ([place]) 12/02/2014   Diabetes mellitus with nephropathy ([place]) 12/02/2014   Detached retina 12/02/2014   Type 2 diabetes mellitus with peripheral neuropathy ([place]) 12/02/2014   Tobacco use 12/02/2014   Urge incontinence 12/02/2014   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   Vitamin D deficiency 12/02/2014    Conditions to be addressed/monitored:  HTN and DMII; Limited social support and Lack of essential utilities - financial challenges having difficulty paying utility bills, needs mattress and rails for steps to her home Care Plan : General Social Work (Adult)  Updates made by [employer], Darla Lesches, LCSW since 04/15/2021 12:00 AM     Problem: CHL AMB "PATIENT-SPECIFIC PROBLEM"   Note:   CARE PLAN ENTRY (see longitudinal plan of care for additional care plan information)  Current Barriers:  Patient with DM in need of assistance with connection to community resources  Knowledge deficits and need for support, education and care coordination related to community resources support  Limited social support and Lack of essential utilities - behind on payments*  Clinical Goal(s)  Over the next 90 days, patient will work with care management  team member to address concerns related to identifying community resources for [employer]  Interventions provided by LCSW:  Assessed patient's care coordination needs related to stated financial challenges and discussed ongoing care management follow up  Collaboration phone call to the [place] to confirm mattress [employer] are donated, however they do not accept soiled mattresses, mattresses that come from homes of smokers or pets. Mattresses are cleaned but not extensively Provided patient with additional information received by the [facility] open to considering the mattress and will make final decision this afternoon Contact information provided for the [place] for assistance with railing for the stairs leading up to her home [phone] Advised patient to follow up with this  agency as well as Care Guide for possible emergency assistance for her utilities Collaborated with appropriate clinical care team members(CCM RNCM) regarding patient needs Emotional support provided related to patients community resource needs Active listening / Reflection utilized  04/01/21 3 pm  Follow up phone call to discuss decision regarding the mattress-Voicemail left for a return call 04/15/21 Collaborated with CCM RNCM regarding patient care needs-confirmed that  patient will have to pay out of pocket for a new bed, gently used beds available through the [facility], however patient declined. Patient will need to submit utility bills to Care Guide for processing as well. Confirmed that patient was given the contact number for the [place] for assistance with handrail  for her front steps, patient will need to call , they do not accept third party referrals  Patient Self Care Activities & Deficits:  Patient is unable to independently navigate community resource options without  care coordination support  Acknowledges deficits and is motivated to resolve concern with encouragement Patient is able to contact [place]  as discussed today Unable to perform IADLs independently Calls provider office for new concerns or questions  Please see past updates related to this goal by clicking on the "Past Updates" button in the selected goal         Follow Up Plan: SW will follow up with patient by phone over the next 14 business days      [employer], [place] Worker  [facility] Care Management [phone]

## 2021-04-15 NOTE — Telephone Encounter (Signed)
Transition Care Management Unsuccessful Follow-up Telephone Call  Date of discharge and from where:  04/13/21 [facility]  Attempts:  1st Attempt  Reason for unsuccessful TCM follow-up call:  Left voice message

## 2021-04-16 ENCOUNTER — Telehealth: Payer: Self-pay

## 2021-04-16 NOTE — Telephone Encounter (Signed)
Transition Care Management Unsuccessful Follow-up Telephone Call  Date of discharge and from where:  04/13/21 [facility]  Attempts:  2nd Attempt  Reason for unsuccessful TCM follow-up call:  Left voice message

## 2021-04-16 NOTE — Telephone Encounter (Signed)
   Telephone encounter was:  Unsuccessful.  04/16/2021 Name: Lisa [name] MRN: [id number] DOB: 09-16-1945  Unsuccessful outbound call made today to assist with:  Financial Difficulties related to utilities . Received copy of [employer] and [employer] bill send request to [facility]. Vella Kohler is out of the office cc'd [employer].  Left message on voicemail for patient's sister-in-law Laurence Spates to return my call regarding request for funds to [facility].  Outreach Attempt:  2nd Attempt  A HIPAA compliant voice message was left requesting a return call.  Instructed patient to call back at [phone].  Ambreen Tufte, AAS Paralegal, [facility] Care Guide  Embedded Care Coordination [place]  Care Management  [address] [place], [place] [address] ??[email or web address]  ?? [phone]   [email or web address]

## 2021-04-16 NOTE — Telephone Encounter (Signed)
   Telephone encounter was:  Successful.  04/16/2021 Name: Lisa [name] MRN: [id number] DOB: 1946-02-02  Lisa [name] is a 75 y.o. year old female who is a primary care patient of Alba Cory, MD . The community resource team was consulted for assistance with Financial Difficulties related to utilities.  Care guide performed the following interventions: Spoke with patient's sister-in-law Laurence Spates to let her know Dayna Barker at [facility] has approved payment of the current amounts for [employer] and [employer].  They can no longer pay past due amounts.   Artelia Laroche stated she would let the patient know.    Follow Up Plan:  No further follow up planned at this time. The patient has been provided with needed resources.  Genevie Elman, AAS Paralegal, [facility] Care Guide  Embedded Care Coordination [place]  Care Management  [address] [place], [place] [address] [email or web address]  ?? [phone]   [email or web address]

## 2021-04-17 NOTE — Telephone Encounter (Signed)
Transition Care Management Unsuccessful Follow-up Telephone Call  Date of discharge and from where:  04/13/21  Attempts:  3rd Attempt  Reason for unsuccessful TCM follow-up call:  Unable to leave message

## 2021-04-22 NOTE — Patient Instructions (Signed)
Thank you for allowing the Chronic Care Management team to participate in your care.     Patient Care Plan: RN Care Manager Plan of Care     Problem Identified: HTN,DM, High Fall Risk      Long-Range Goal: Disease Progression Prevented or Minimized   Start Date: 03/21/2021  Expected End Date: 06/19/2021  Priority: High  Note:   Current Barriers:  Chronic Disease Management support and education needs related to HTN, DMII, and High Fall Risk  RNCM Clinical Goal(s):  Patient will demonstrate ongoing adherence to prescribed treatment plan for HTN, DMII, and Fall Risk. Patient will work with [employer] care guide to address needs related to  Financial constraints related to nutrition and utilities through collaboration with Consulting civil engineer, provider, and care team.   Interventions: 1:1 collaboration with primary care provider regarding development and update of comprehensive plan of care as evidenced by provider attestation and co-signature Inter-disciplinary care team collaboration (see longitudinal plan of care) Evaluation of current treatment plan related to  self management and patient's adherence to plan as established by provider  Last practice recorded BP readings:  BP Readings from Last 3 Encounters:  03/19/21 (!) 178/74  03/03/21 (!) 148/59  02/20/21 136/74  Most recent eGFR/CrCl:  Lab Results  Component Value Date   EGFR 72 02/20/2021    No components found for: CRCL   Hypertension Interventions: Reviewed medications and importance of compliance. Advised to continue taking medication as prescribed. Advised to notify her provider if unable to tolerate the prescribed regimen. Advised to update the care management team with concerns regarding medication management or prescription cost. Provided information regarding established blood pressure parameters along with indications for notifying a provider. Encouraged to monitor and record readings.  Discussed compliance  with recommended cardiac prudent diet. Encouraged to read nutrition labels, monitor sodium intake and avoid highly processed foods when possible. Expressed concerns regarding meals and nutritional resources. Referral was placed for outreach with the [place]. Discussed complications of uncontrolled blood pressure.  Reviewed s/sx of heart attack, stroke and worsening symptoms that require immediate medical attention.  Falls Interventions: Reviewed medications and discussed potential side effects such as dizziness, lightheadedness and frequent urination. Provided information regarding safety and fall prevention. Risk for falls is increased d/t impaired gait and impaired vision. Reports having a cane and walker available to use as needed in the home. Reports currently receiving Home Health services. Advised to follow precautions and activity restrictions as instructed by the physical therapy team. Discussed ability to perform ADL's and tasks in the home. Currently lives alone.  Reports completing ADL's independently. Reports her brother stays occasionally to assist with task in the home and transportation. Advised to update the care management team of changes in functional status.    Lab Results  Component Value Date   HGBA1C 6.2 (A) 02/20/2021   Diabetes Interventions: Assessed patient's understanding of A1c goal: <7% Reviewed medications. Advised to take medications as prescribed. Advised to update the care management team with concerns regarding medication management or prescription cost. Provided information regarding importance of consistent blood glucose monitoring. Reports not monitoring blood glucose consistently d/t glucometer being broken. She will contact her insurance provider to confirmed preferred glucometer to decrease out of pocket cost. Will update/message provider for new order and supplies once preferred glucometer is confirmed.  Reviewed s/sx of hypoglycemia  and hyperglycemia along with recommended interventions. Discussed nutritional intake and importance of complying with a carb modified/diabetic diet. She required assistance  with nutritional resources. Referral submitted for outreach with the [place]. Discussed and offered referrals for available Diabetes education classes. Declined current need for additional diabetes education. Her A1C is currently at goal of less than 7%. Will continue to outreach to assist with optimal glycemic control. Discussed importance of completing recommended DM preventive care. Reports completing recommended foot care. Toes amputated from left foot. Reports completing regular eye exams d/t glaucoma and loss of vision in left eye. Reports she will contact the [facility] to schedule a follow up r/t blurred vision in right eye.     Wellness/Community Resource Interventions: Update on 04/15/21 Feedback received from [facility]. Mrs. Wollam was informed that the supply company does not accept her insurance. She is aware that they are willing to provide a discount for supplies, however she will be required to pay out of pocket. Discussed options available via the [place] [facility]. Coordinated with the Administrative Coordinator, Verdene Lennert. The agency will provider a 30 day supply of adult pull-ups and bed bed pads.  Mrs. Laskin aware that  supplies via the Diaper Program will not be delivered and will only be available in 30 day increments when supplies are available. She will agreed to speak with her sister-in-law Mearl Latin to pick up the supplies either today or tomorrow.     Patient Goals/Self-Care Activities: Take all  medications as prescribed Attend all scheduled provider appointments Contact pharmacy for medication refills Update the care management team with changes in functional status Complete needed documents and return to the [place] the  clinic for questions and new concerns as needed   Follow Up Plan:   Will follow up next month.     Mrs. Roblero verbalized understanding of the information discussed during the telephonic outreach. Declined need for mailed/printed instructions.  A member of the care management team will follow up next month.   [facility] Care Management [facility] [phone]

## 2021-04-22 NOTE — Chronic Care Management (AMB) (Signed)
Chronic Care Management   CCM RN Visit Note   Name: Lisa [name] MRN: [id number] DOB: September 03, 1945  Subjective: Lisa [name] is a 75 y.o. year old female who is a primary care patient of Steele Sizer, MD. The care management team was consulted for assistance with disease management and care coordination needs.    Engaged with patient by telephone for follow up visit in response to provider referral for case management and care coordination services.   Consent to Services:  The patient was given information about Chronic Care Management services, agreed to services, and gave verbal consent prior to initiation of services.  Please see initial visit note for detailed documentation.    Assessment: Review of patient past medical history, allergies, medications, health status, including review of consultants reports, laboratory and other test data, was performed as part of comprehensive evaluation and provision of chronic care management services.   SDOH (Social Determinants of Health) assessments and interventions performed:  No  CCM Care Plan  Allergies  Allergen Reactions   Other Itching    Nucinta   Tapentadol Hcl     Outpatient Encounter Medications as of 04/15/2021  Medication Sig Note   aspirin 81 MG tablet Take 81 mg by mouth daily.    atropine 1 % ophthalmic solution Place 1 drop into the right eye 3 (three) times daily. 02/26/2021: Last dispensed: 12/31/2020 for 34d supply    blood glucose meter kit and supplies Dispense based on patient and insurance preference. Use up to four times daily as directed. (FOR ICD-10 E10.9, E11.9).    Blood Glucose Monitoring Suppl (ACCU-CHEK AVIVA CONNECT) w/Device KIT 1 each by Does not apply route daily. And supplies E11.9 check fsbs bid    brimonidine (ALPHAGAN) 0.2 % ophthalmic solution Place 1 drop into both eyes 2 (two) times daily. 02/26/2021: Last dispensed: 12/31/2020 for 50d supply   citalopram (CELEXA) 20 MG tablet Take 1 tablet (20 mg  total) by mouth daily.    clopidogrel (PLAVIX) 75 MG tablet Take 1 tablet (75 mg total) by mouth daily.    Cyanocobalamin (B-12) 1000 MCG SUBL Place 1 tablet under the tongue daily.    dorzolamide-timolol (COSOPT) 22.3-6.8 MG/ML ophthalmic solution SMARTSIG:In Eye(s)    furosemide (LASIX) 40 MG tablet Take 1 tablet (40 mg total) by mouth daily.    JANUVIA 100 MG tablet TAKE 1 TABLET BY MOUTH EVERY DAY    losartan (COZAAR) 100 MG tablet Take 1 tablet (100 mg total) by mouth daily.    metoprolol succinate (TOPROL XL) 50 MG 24 hr tablet Take 1 tablet (50 mg total) by mouth daily. Take with or immediately following a meal.    Multiple Vitamin (MULTIVITAMIN) tablet Take 1 tablet by mouth daily.    nitroGLYCERIN (NITROSTAT) 0.4 MG SL tablet Place 1 tablet (0.4 mg total) under the tongue every 5 (five) minutes as needed for chest pain.    pioglitazone (ACTOS) 15 MG tablet Take 1 tablet (15 mg total) by mouth daily.    potassium chloride (KLOR-CON) 20 MEQ tablet Take 1 tablet (20 mEq total) by mouth daily.    prednisoLONE acetate (PRED FORTE) 1 % ophthalmic suspension Place into the right eye.    psyllium (METAMUCIL) 0.52 g capsule Take 1 capsule (0.52 g total) by mouth daily. 04/05/2021: Reports receiving metamucil powder at pharmacy.   rosuvastatin (CRESTOR) 10 MG tablet Take 1 tablet (10 mg total) by mouth daily.    Vitamin D, Ergocalciferol, (DRISDOL) 1.25 MG (50000 UNIT) CAPS capsule TAKE  1 CAPSULE (50,000 UNITS TOTAL) BY MOUTH EVERY 7 (SEVEN) DAYS.    No facility-administered encounter medications on file as of 04/15/2021.    Patient Active Problem List   Diagnosis Date Noted   Acute CHF (congestive heart failure) ([place]) 04/10/2021   Hypertensive emergency 04/09/2021   Acute on chronic diastolic CHF (congestive heart failure) ([place]) 04/09/2021   Acute respiratory failure ([place]) 04/09/2021   Nicotine dependence 04/09/2021   AKI (acute kidney injury) ([place]) 02/26/2021   Hypokalemia 02/26/2021    Hypomagnesemia 02/26/2021   Bruising 02/26/2021   Syncope and collapse 02/26/2021   Prolonged QT interval 02/26/2021   Uncontrolled type 2 diabetes with eye complications 75/02/2584   DM (diabetes mellitus), type 2 with peripheral vascular complications ([place]) 27/78/2423   Amputation of left foot ([place]) 12/02/2014   Benign essential HTN 12/02/2014   Depression, major, recurrent, mild ([place]) 12/02/2014   Dyslipidemia 12/02/2014   Vitamin B12 deficiency 12/02/2014   Peripheral neuropathic pain 12/02/2014   Peripheral artery disease ([place]) 12/02/2014   Phantom limb ([place]) 12/02/2014   Diabetes mellitus with nephropathy ([place]) 12/02/2014   Detached retina 12/02/2014   Type 2 diabetes mellitus with peripheral neuropathy ([place]) 12/02/2014   Tobacco use 12/02/2014   Urge incontinence 12/02/2014   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   Vitamin D deficiency 12/02/2014    Patient Care Plan: RN Care Manager Plan of Care     Problem Identified: HTN,DM, High Fall Risk      Long-Range Goal: Disease Progression Prevented or Minimized   Start Date: 03/21/2021  Expected End Date: 06/19/2021  Priority: High  Note:   Current Barriers:  Chronic Disease Management support and education needs related to HTN, DMII, and High Fall Risk  RNCM Clinical Goal(s):  Patient will demonstrate ongoing adherence to prescribed treatment plan for HTN, DMII, and Fall Risk. Patient will work with [employer] care guide to address needs related to  Financial constraints related to nutrition and utilities through collaboration with Consulting civil engineer, provider, and care team.   Interventions: 1:1 collaboration with primary care provider regarding development and update of comprehensive plan of care as evidenced by provider attestation and co-signature Inter-disciplinary care team collaboration (see longitudinal plan of care) Evaluation of current treatment plan related to  self management and patient's adherence to plan as  established by provider  Last practice recorded BP readings:  BP Readings from Last 3 Encounters:  03/19/21 (!) 178/74  03/03/21 (!) 148/59  02/20/21 136/74  Most recent eGFR/CrCl:  Lab Results  Component Value Date   EGFR 72 02/20/2021    No components found for: CRCL   Hypertension Interventions: Reviewed medications and importance of compliance. Advised to continue taking medication as prescribed. Advised to notify her provider if unable to tolerate the prescribed regimen. Advised to update the care management team with concerns regarding medication management or prescription cost. Provided information regarding established blood pressure parameters along with indications for notifying a provider. Encouraged to monitor and record readings.  Discussed compliance with recommended cardiac prudent diet. Encouraged to read nutrition labels, monitor sodium intake and avoid highly processed foods when possible. Expressed concerns regarding meals and nutritional resources. Referral was placed for outreach with the [place]. Discussed complications of uncontrolled blood pressure.  Reviewed s/sx of heart attack, stroke and worsening symptoms that require immediate medical attention.  Falls Interventions: Reviewed medications and discussed potential side effects such as dizziness, lightheadedness and frequent urination. Provided information regarding safety and fall prevention. Risk for  falls is increased d/t impaired gait and impaired vision. Reports having a cane and walker available to use as needed in the home. Reports currently receiving Home Health services. Advised to follow precautions and activity restrictions as instructed by the physical therapy team. Discussed ability to perform ADL's and tasks in the home. Currently lives alone.  Reports completing ADL's independently. Reports her brother stays occasionally to assist with task in the home and transportation. Advised  to update the care management team of changes in functional status.    Lab Results  Component Value Date   HGBA1C 6.2 (A) 02/20/2021   Diabetes Interventions: Assessed patient's understanding of A1c goal: <7% Reviewed medications. Advised to take medications as prescribed. Advised to update the care management team with concerns regarding medication management or prescription cost. Provided information regarding importance of consistent blood glucose monitoring. Reports not monitoring blood glucose consistently d/t glucometer being broken. She will contact her insurance provider to confirmed preferred glucometer to decrease out of pocket cost. Will update/message provider for new order and supplies once preferred glucometer is confirmed.  Reviewed s/sx of hypoglycemia and hyperglycemia along with recommended interventions. Discussed nutritional intake and importance of complying with a carb modified/diabetic diet. She required assistance with nutritional resources. Referral submitted for outreach with the [place]. Discussed and offered referrals for available Diabetes education classes. Declined current need for additional diabetes education. Her A1C is currently at goal of less than 7%. Will continue to outreach to assist with optimal glycemic control. Discussed importance of completing recommended DM preventive care. Reports completing recommended foot care. Toes amputated from left foot. Reports completing regular eye exams d/t glaucoma and loss of vision in left eye. Reports she will contact the [facility] to schedule a follow up r/t blurred vision in right eye.     Wellness/Community Resource Interventions: Update on 04/15/21 Feedback received from [facility]. Mrs. Devoss was informed that the supply company does not accept her insurance. She is aware that they are willing to provide a discount for supplies, however she will be required to pay out of  pocket. Discussed options available via the [place] [facility]. Coordinated with the Administrative Coordinator, Verdene Lennert. The agency will provider a 30 day supply of adult pull-ups and bed bed pads.  Mrs. Nistler aware that  supplies via the Diaper Program will not be delivered and will only be available in 30 day increments when supplies are available. She will agreed to speak with her sister-in-law Mearl Latin to pick up the supplies either today or tomorrow.     Patient Goals/Self-Care Activities: Take all  medications as prescribed Attend all scheduled provider appointments Contact pharmacy for medication refills Update the care management team with changes in functional status Complete needed documents and return to the [place] the clinic for questions and new concerns as needed   Follow Up Plan:   Will follow up next month.      PLAN A member of the care management team will follow up next month.   [facility] Care Management [facility] [phone]

## 2021-04-22 NOTE — Progress Notes (Deleted)
   [name], female    DOB: 1945-08-25, 75 y.o.   MRN: [id number]  HPI  Lisa [name] is a 75 y/o female with a history of  Echo report from 02/26/21 reviewed and showed an EF of 60-65% along with mild LVH.   Admitted 04/09/21 due to shortness of breath and cough due to acute on chronic heart failure. Initially placed on bipap but then able to be weaned down to 5L nasal cannula. Cardiology consult obtained. Given IV lasix with transition to oral diuretics. Patient declined cardiac cath. Discharged after 4 days.   She presents today for her initial visit with a chief complaint of   Review of Systems    Physical Exam  Assessment & Plan:  1: Chronic heart failure with preserved ejection fraction along with structural changes (LVH)- - NYHA   - BNP 04/09/21 was 378.9  2: HTN- - BP - saw PCP ([place]) 03/19/21 - BMP 04/13/21 reviewed and showed sodium 137, potassium 4.2, creatinine 0.7 and GFR >60  3: DM- - A1c 02/20/21 was 6.2%  4: Depression-

## 2021-04-24 ENCOUNTER — Ambulatory Visit: Payer: 59 | Admitting: Family

## 2021-04-24 DIAGNOSIS — F330 Major depressive disorder, recurrent, mild: Secondary | ICD-10-CM | POA: Diagnosis not present

## 2021-04-24 DIAGNOSIS — E1159 Type 2 diabetes mellitus with other circulatory complications: Secondary | ICD-10-CM | POA: Diagnosis not present

## 2021-04-24 DIAGNOSIS — E119 Type 2 diabetes mellitus without complications: Secondary | ICD-10-CM

## 2021-04-24 DIAGNOSIS — I10 Essential (primary) hypertension: Secondary | ICD-10-CM

## 2021-04-24 DIAGNOSIS — E1121 Type 2 diabetes mellitus with diabetic nephropathy: Secondary | ICD-10-CM | POA: Diagnosis not present

## 2021-04-24 DIAGNOSIS — E1142 Type 2 diabetes mellitus with diabetic polyneuropathy: Secondary | ICD-10-CM

## 2021-04-24 DIAGNOSIS — I152 Hypertension secondary to endocrine disorders: Secondary | ICD-10-CM

## 2021-04-26 ENCOUNTER — Ambulatory Visit (INDEPENDENT_AMBULATORY_CARE_PROVIDER_SITE_OTHER): Payer: 59 | Admitting: *Deleted

## 2021-04-26 DIAGNOSIS — E119 Type 2 diabetes mellitus without complications: Secondary | ICD-10-CM

## 2021-04-26 DIAGNOSIS — Z789 Other specified health status: Secondary | ICD-10-CM

## 2021-04-26 DIAGNOSIS — E1159 Type 2 diabetes mellitus with other circulatory complications: Secondary | ICD-10-CM

## 2021-04-26 NOTE — Patient Instructions (Signed)
Visit Information  Thank you for taking time to visit with me today. Please don't hesitate to contact me if I can be of assistance to you before our next scheduled telephone appointment.  Following are the goals we discussed today:   - call 211 when I need some help - follow-up on any referrals for help I am given - think ahead to make sure my need does not become an emergency - have a back-up plan     If you are experiencing a Mental Health or Behavioral Health Crisis or need someone to talk to, please call the Suicide and Crisis Lifeline: 988   Following is a copy of your full plan of care:  Care Plan : General Social Work (Adult)  Updates made by [employer], Clent Jacks, LCSW since 04/26/2021 12:00 AM     Problem: CHL AMB "PATIENT-SPECIFIC PROBLEM"   Note:   CARE PLAN ENTRY (see longitudinal plan of care for additional care plan information)  Current Barriers:  Patient with DM in need of assistance with connection to community resources  Knowledge deficits and need for support, education and care coordination related to community resources support  Limited social support and Lack of essential utilities - behind on payments*  Clinical Goal(s)  Over the next 90 days, patient will work with care management team member to address concerns related to identifying community resources for [name]  Interventions provided by LCSW:  Assessed patient's care coordination needs related to stated financial challenges and discussed ongoing care management follow up  Confirmed/clarified that the current amount of her gas bill and [employer] bill have been paid through the [employer] and patient will be responsible for past due amounts Recommended that patient contact [employer] 211 and the Department of Social Services for additional assistance options Further recommended that she reach out to family ,friends, church family for any possible assistance  well Confirmed that family has helped financially as well as assisted with organizing bills however patient remains overwhelmed-currently working on focusing on necessities. ad Contact information provided for the [facility] [employer] for assistance with railing for the stairs leading up to her home [phone] states she has not contacting them yet Currently receives Meals on Wheels Active listening / Reflection utilized  Emotional support provided, positive coping strategies discussed related to patients community resource needs, reinforced use of community resources discussed above  Patient Self Care Activities & Deficits:  Patient is unable to independently navigate community resource options without care coordination support  Acknowledges deficits and is motivated to resolve concern with encouragement Patient is able to contact 211 [name]  as discussed today Unable to perform IADLs independently Calls provider office for new concerns or questions  Please see past updates related to this goal by clicking on the "Past Updates" button in the selected goal        Lisa [name] was given information about Care Management services by the embedded care coordination team including:  Care Management services include personalized support from designated clinical staff supervised by her physician, including individualized plan of care and coordination with other care providers 24/7 contact phone numbers for assistance for urgent and routine care needs. The patient may stop CCM services at any time (effective at the end of the month) by phone call to the office staff.  Patient agreed to services and verbal consent obtained.   The patient verbalized understanding of instructions, educational materials, and care plan provided today and declined  offer to receive copy of patient instructions, educational materials, and care plan.   No further follow up required: patient to contact  this Child psychotherapist with any additional community resource needs  [employer], LCSW Clinical Social Worker  [facility] Care Management [phone]

## 2021-04-26 NOTE — Chronic Care Management (AMB) (Signed)
Chronic Care Management    Clinical Social Work Note  04/26/2021 Name: Lisa [name] MRN: [id number] DOB: 04-Apr-1946  Lisa [name] is a 75 y.o. year old female who is a primary care patient of Lisa Sizer, MD. The CCM team was consulted to assist the patient with chronic disease management and/or care coordination needs related to: [employer] .   Engaged with patient by telephone for follow up visit in response to provider referral for social work chronic care management and care coordination services.   Consent to Services:  The patient was given information about Chronic Care Management services, agreed to services, and gave verbal consent prior to initiation of services.  Please see initial visit note for detailed documentation.   Patient agreed to services and consent obtained.   Assessment: Review of patient past medical history, allergies, medications, and health status, including review of relevant consultants reports was performed today as part of a comprehensive evaluation and provision of chronic care management and care coordination services.     SDOH (Social Determinants of Health) assessments and interventions performed:    Advanced Directives Status: Not addressed in this encounter.  CCM Care Plan  Allergies  Allergen Reactions   Other Itching    Nucinta   Tapentadol Hcl     Outpatient Encounter Medications as of 04/26/2021  Medication Sig Note   aspirin 81 MG tablet Take 81 mg by mouth daily.    atropine 1 % ophthalmic solution Place 1 drop into the right eye 3 (three) times daily. 02/26/2021: Last dispensed: 12/31/2020 for 34d supply    blood glucose meter kit and supplies Dispense based on patient and insurance preference. Use up to four times daily as directed. (FOR ICD-10 E10.9, E11.9).    Blood Glucose Monitoring Suppl (ACCU-CHEK AVIVA CONNECT) w/Device KIT 1 each by Does not apply route daily. And supplies E11.9 check fsbs bid    brimonidine  (ALPHAGAN) 0.2 % ophthalmic solution Place 1 drop into both eyes 2 (two) times daily. 02/26/2021: Last dispensed: 12/31/2020 for 50d supply   citalopram (CELEXA) 20 MG tablet Take 1 tablet (20 mg total) by mouth daily.    clopidogrel (PLAVIX) 75 MG tablet Take 1 tablet (75 mg total) by mouth daily.    Cyanocobalamin (B-12) 1000 MCG SUBL Place 1 tablet under the tongue daily.    dorzolamide-timolol (COSOPT) 22.3-6.8 MG/ML ophthalmic solution SMARTSIG:In Eye(s)    furosemide (LASIX) 40 MG tablet Take 1 tablet (40 mg total) by mouth daily.    JANUVIA 100 MG tablet TAKE 1 TABLET BY MOUTH EVERY DAY    losartan (COZAAR) 100 MG tablet Take 1 tablet (100 mg total) by mouth daily.    metoprolol succinate (TOPROL XL) 50 MG 24 hr tablet Take 1 tablet (50 mg total) by mouth daily. Take with or immediately following a meal.    Multiple Vitamin (MULTIVITAMIN) tablet Take 1 tablet by mouth daily.    nitroGLYCERIN (NITROSTAT) 0.4 MG SL tablet Place 1 tablet (0.4 mg total) under the tongue every 5 (five) minutes as needed for chest pain.    pioglitazone (ACTOS) 15 MG tablet Take 1 tablet (15 mg total) by mouth daily.    potassium chloride (KLOR-CON) 20 MEQ tablet Take 1 tablet (20 mEq total) by mouth daily.    prednisoLONE acetate (PRED FORTE) 1 % ophthalmic suspension Place into the right eye.    psyllium (METAMUCIL) 0.52 g capsule Take 1 capsule (0.52 g total) by mouth daily. 04/05/2021: Reports receiving metamucil powder at pharmacy.  rosuvastatin (CRESTOR) 10 MG tablet Take 1 tablet (10 mg total) by mouth daily.    Vitamin D, Ergocalciferol, (DRISDOL) 1.25 MG (50000 UNIT) CAPS capsule TAKE 1 CAPSULE (50,000 UNITS TOTAL) BY MOUTH EVERY 7 (SEVEN) DAYS.    No facility-administered encounter medications on file as of 04/26/2021.    Patient Active Problem List   Diagnosis Date Noted   Acute CHF (congestive heart failure) ([place]) 04/10/2021   Hypertensive emergency 04/09/2021   Acute on chronic diastolic CHF  (congestive heart failure) ([place]) 04/09/2021   Acute respiratory failure ([place]) 04/09/2021   Nicotine dependence 04/09/2021   AKI (acute kidney injury) ([place] [place]) 02/26/2021   Hypokalemia 02/26/2021   Hypomagnesemia 02/26/2021   Bruising 02/26/2021   Syncope and collapse 02/26/2021   Prolonged QT interval 02/26/2021   Uncontrolled type 2 diabetes with eye complications 08/65/7846   DM (diabetes mellitus), type 2 with peripheral vascular complications ([place]) 96/29/5284   Amputation of left foot ([place]) 12/02/2014   Benign essential HTN 12/02/2014   Depression, major, recurrent, mild ([place]) 12/02/2014   Dyslipidemia 12/02/2014   Vitamin B12 deficiency 12/02/2014   Peripheral neuropathic pain 12/02/2014   Peripheral artery disease ([place]) 12/02/2014   Phantom limb ([place]) 12/02/2014   Diabetes mellitus with nephropathy ([place]) 12/02/2014   Detached retina 12/02/2014   Type 2 diabetes mellitus with peripheral neuropathy ([place]) 12/02/2014   Tobacco use 12/02/2014   Urge incontinence 12/02/2014   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   Vitamin D deficiency 12/02/2014    Conditions to be addressed/monitored:  HTN and DMII; Limited social support and Lack of essential utilities - financial challenges having difficulty paying utility bills, needs mattress and rails for steps to her home Care Plan : General Social Work (Adult)  Updates made by [employer], Darla Lesches, LCSW since 04/26/2021 12:00 AM     Problem: CHL AMB "PATIENT-SPECIFIC PROBLEM"   Note:   CARE PLAN ENTRY (see longitudinal plan of care for additional care plan information)  Current Barriers:  Patient with DM in need of assistance with connection to community resources  Knowledge deficits and need for support, education and care coordination related to community resources support  Limited social support and Lack of essential utilities - behind on payments*  Clinical Goal(s)  Over the next 90 days, patient will work with care management team member to  address concerns related to identifying community resources for [employer]  Interventions provided by LCSW:  Assessed patient's care coordination needs related to stated financial challenges and discussed ongoing care management follow up  Confirmed/clarified that the current amount of her gas bill and [employer] bill have been paid through the [employer] and patient will be responsible for past due amounts Recommended that patient contact [employer] [address] and the Department of Social Services for additional assistance options Further recommended that she reach out to family ,friends, church family for any possible assistance well Confirmed that family has helped financially as well as assisted with organizing bills however patient remains overwhelmed-currently working on focusing on necessities. ad Contact information provided for the [place] for assistance with railing for the stairs leading up to her home [phone] states she has not contacting them yet Currently receives Meals on Wheels Active listening / Reflection utilized  Emotional support provided, positive coping strategies discussed related to patients community resource needs, reinforced use of community resources discussed above  Patient Self Care Activities & Deficits:  Patient is unable to independently navigate community resource options without  care coordination support  Acknowledges deficits and is motivated to resolve concern with encouragement Patient is able to contact [place]  as discussed today Unable to perform IADLs independently Calls provider office for new concerns or questions  Please see past updates related to this goal by clicking on the "Past Updates" button in the selected goal         Follow Up Plan: Client will contact this social worker with any additional community resource needs      [place], [place] Worker   [facility] Care [facility] [phone]

## 2021-05-01 ENCOUNTER — Ambulatory Visit: Payer: 59 | Admitting: Family

## 2021-05-08 ENCOUNTER — Telehealth: Payer: Self-pay | Admitting: *Deleted

## 2021-05-08 ENCOUNTER — Ambulatory Visit: Payer: Self-pay | Admitting: *Deleted

## 2021-05-08 DIAGNOSIS — E119 Type 2 diabetes mellitus without complications: Secondary | ICD-10-CM

## 2021-05-08 DIAGNOSIS — E1159 Type 2 diabetes mellitus with other circulatory complications: Secondary | ICD-10-CM

## 2021-05-08 DIAGNOSIS — Z789 Other specified health status: Secondary | ICD-10-CM

## 2021-05-08 NOTE — Patient Instructions (Signed)
Visit Information  Thank you for taking time to visit with me today. Please don't hesitate to contact me if I can be of assistance to you before our next scheduled telephone appointment.  - call 211 when I need some help - follow-up on any referrals for help I am given - think ahead to make sure my need does not become an emergency - have a back-up plan  If you are experiencing a Mental Health or Behavioral Health Crisis or need someone to talk to, please call the Suicide and Crisis Lifeline: 988   The patient verbalized understanding of instructions, educational materials, and care plan provided today and declined offer to receive copy of patient instructions, educational materials, and care plan.   No further follow up required: patient to follow up with the recommended community resources discussed. Patient to contact this Child psychotherapist with any additional community resource needs  [employer], LCSW Clinical Social Worker  [facility] Care Management [phone]

## 2021-05-08 NOTE — Telephone Encounter (Signed)
°  Care Management   Follow Up Note   05/08/2021 Name: Kelcey Korus MRN: [id number] DOB: Mar 06, 1946   Referred by: Alba Cory, MD Reason for referral : Care Coordination   An unsuccessful telephone outreach was attempted today. The patient was referred to the case management team for assistance with care management and care coordination.   Follow Up Plan: Telephone follow up appointment with care management team member scheduled for: 05/13/21   Verna Czech, LCSW Clinical Social Worker  [facility] Care Management [phone]

## 2021-05-08 NOTE — Chronic Care Management (AMB) (Signed)
Chronic Care Management    Clinical Social Work Note  05/08/2021 Name: Lisa [name] MRN: [id number] DOB: [birth year]  Lisa [name] is a 75 y.o. year old female who is a primary care patient of Steele Sizer, MD. The CCM team was consulted to assist the patient with chronic disease management and/or care coordination needs related to: [employer] .   Engaged with patient by telephone for follow up visit in response to provider referral for social work chronic care management and care coordination services.   Consent to Services:  The patient was given information about Chronic Care Management services, agreed to services, and gave verbal consent prior to initiation of services.  Please see initial visit note for detailed documentation.   Patient agreed to services and consent obtained.   Assessment: Review of patient past medical history, allergies, medications, and health status, including review of relevant consultants reports was performed today as part of a comprehensive evaluation and provision of chronic care management and care coordination services.     SDOH (Social Determinants of Health) assessments and interventions performed:    Advanced Directives Status: Not addressed in this encounter.  CCM Care Plan  Allergies  Allergen Reactions   Other Itching    Nucinta   Tapentadol Hcl     Outpatient Encounter Medications as of 05/08/2021  Medication Sig Note   aspirin 81 MG tablet Take 81 mg by mouth daily.    atropine 1 % ophthalmic solution Place 1 drop into the right eye 3 (three) times daily. 02/26/2021: Last dispensed: 12/31/2020 for 34d supply    blood glucose meter kit and supplies Dispense based on patient and insurance preference. Use up to four times daily as directed. (FOR ICD-10 E10.9, E11.9).    Blood Glucose Monitoring Suppl (ACCU-CHEK AVIVA CONNECT) w/Device KIT 1 each by Does not apply route daily. And supplies E11.9 check fsbs bid    brimonidine  (ALPHAGAN) 0.2 % ophthalmic solution Place 1 drop into both eyes 2 (two) times daily. 02/26/2021: Last dispensed: 12/31/2020 for 50d supply   citalopram (CELEXA) 20 MG tablet Take 1 tablet (20 mg total) by mouth daily.    clopidogrel (PLAVIX) 75 MG tablet Take 1 tablet (75 mg total) by mouth daily.    Cyanocobalamin (B-12) 1000 MCG SUBL Place 1 tablet under the tongue daily.    dorzolamide-timolol (COSOPT) 22.3-6.8 MG/ML ophthalmic solution SMARTSIG:In Eye(s)    furosemide (LASIX) 40 MG tablet Take 1 tablet (40 mg total) by mouth daily.    JANUVIA 100 MG tablet TAKE 1 TABLET BY MOUTH EVERY DAY    losartan (COZAAR) 100 MG tablet Take 1 tablet (100 mg total) by mouth daily.    metoprolol succinate (TOPROL XL) 50 MG 24 hr tablet Take 1 tablet (50 mg total) by mouth daily. Take with or immediately following a meal.    Multiple Vitamin (MULTIVITAMIN) tablet Take 1 tablet by mouth daily.    nitroGLYCERIN (NITROSTAT) 0.4 MG SL tablet Place 1 tablet (0.4 mg total) under the tongue every 5 (five) minutes as needed for chest pain.    pioglitazone (ACTOS) 15 MG tablet Take 1 tablet (15 mg total) by mouth daily.    potassium chloride (KLOR-CON) 20 MEQ tablet Take 1 tablet (20 mEq total) by mouth daily.    prednisoLONE acetate (PRED FORTE) 1 % ophthalmic suspension Place into the right eye.    psyllium (METAMUCIL) 0.52 g capsule Take 1 capsule (0.52 g total) by mouth daily. 04/05/2021: Reports receiving metamucil powder at pharmacy.  rosuvastatin (CRESTOR) 10 MG tablet Take 1 tablet (10 mg total) by mouth daily.    Vitamin D, Ergocalciferol, (DRISDOL) 1.25 MG (50000 UNIT) CAPS capsule TAKE 1 CAPSULE (50,000 UNITS TOTAL) BY MOUTH EVERY 7 (SEVEN) DAYS.    No facility-administered encounter medications on file as of 05/08/2021.    Patient Active Problem List   Diagnosis Date Noted   Acute CHF (congestive heart failure) ([place]) 04/10/2021   Hypertensive emergency 04/09/2021   Acute on chronic diastolic CHF  (congestive heart failure) ([place]) 04/09/2021   Acute respiratory failure ([place]) 04/09/2021   Nicotine dependence 04/09/2021   AKI (acute kidney injury) ([place]) 02/26/2021   Hypokalemia 02/26/2021   Hypomagnesemia 02/26/2021   Bruising 02/26/2021   Syncope and collapse 02/26/2021   Prolonged QT interval 02/26/2021   Uncontrolled type 2 diabetes with eye complications 78/29/5621   DM (diabetes mellitus), type 2 with peripheral vascular complications ([place]) 30/86/5784   Amputation of left foot ([place]) 12/02/2014   Benign essential HTN 12/02/2014   Depression, major, recurrent, mild ([place]) 12/02/2014   Dyslipidemia 12/02/2014   Vitamin B12 deficiency 12/02/2014   Peripheral neuropathic pain 12/02/2014   Peripheral artery disease ([place]) 12/02/2014   Phantom limb ([place]) 12/02/2014   Diabetes mellitus with nephropathy ([place]) 12/02/2014   Detached retina 12/02/2014   Type 2 diabetes mellitus with peripheral neuropathy ([place]) 12/02/2014   Tobacco use 12/02/2014   Urge incontinence 12/02/2014   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   Vitamin D deficiency 12/02/2014    Conditions to be addressed/monitored: ;  HTN and DMII; Limited social support and Lack of essential utilities - financial challenges having difficulty paying utility bills, needs mattress and rails for steps to her home Care Plan : General Social Work (Adult)  Updates made by [employer], Baruch Gouty M, LCSW since 05/08/2021 12:00 AM     Problem: CHL AMB "PATIENT-SPECIFIC PROBLEM"   Note:   CARE PLAN ENTRY (see longitudinal plan of care for additional care plan information)  Current Barriers: Patient with DM in need of assistance with connection to community resources  Knowledge deficits and need for support, education and care coordination related to community resources support  Limited social support and Lack of essential utilities - behind on payments*  Clinical Goal(s)  Over the next 90 days, patient will work with care management team member to  address concerns related to identifying community resources for [employer]  Interventions provided by LCSW:  Assessed patient's care coordination needs related to stated financial challenges and discussed ongoing care management follow up  Confirmed that the [facility] assisted with patient's power and gas bill paying the current amount -patient discussed plan to make payment arrangements  Consumer Credit Counseling recommended through [employer] of the [place] for debt management-patient confirms current involvement with a consumer credit counseling company-declines need for additional assistance(will call this Education officer, museum back to get contact information at needed) patient also agreeable to downsizing to minimize expenditures Further recommended that she reach out to family ,friends, church family for any possible assistance well Continues to receive Meals on [employer] Active listening / Reflection utilized  Emotional support provided, positive coping strategies discussed related to patients community resource needs, reinforced use of community resources discussed above  Patient Self Care Activities & Deficits:  Patient is unable to independently navigate community resource options without care coordination support  Acknowledges deficits and is motivated to resolve concern with encouragement Patient is able to contact [address] [employer]  as discussed today Unable to perform  IADLs independently Calls provider office for new concerns or questions  Please see past updates related to this goal by clicking on the "Past Updates" button in the selected goal         Follow Up Plan: Client will contact this social worker with any additional community resoure needs      Elliot Gurney, [place] Worker  [facility] Care Management [phone]

## 2021-05-10 ENCOUNTER — Encounter: Payer: Self-pay | Admitting: Family

## 2021-05-10 ENCOUNTER — Other Ambulatory Visit: Payer: Self-pay

## 2021-05-10 ENCOUNTER — Ambulatory Visit: Payer: Medicare Other | Attending: Family | Admitting: Family

## 2021-05-10 VITALS — BP 165/79 | HR 72 | Resp 16 | Ht 64.0 in | Wt 133.2 lb

## 2021-05-10 DIAGNOSIS — E1159 Type 2 diabetes mellitus with other circulatory complications: Secondary | ICD-10-CM | POA: Diagnosis not present

## 2021-05-10 DIAGNOSIS — R42 Dizziness and giddiness: Secondary | ICD-10-CM | POA: Diagnosis not present

## 2021-05-10 DIAGNOSIS — F32A Depression, unspecified: Secondary | ICD-10-CM

## 2021-05-10 DIAGNOSIS — I110 Hypertensive heart disease with heart failure: Secondary | ICD-10-CM | POA: Diagnosis not present

## 2021-05-10 DIAGNOSIS — E1151 Type 2 diabetes mellitus with diabetic peripheral angiopathy without gangrene: Secondary | ICD-10-CM | POA: Diagnosis not present

## 2021-05-10 DIAGNOSIS — I10 Essential (primary) hypertension: Secondary | ICD-10-CM | POA: Diagnosis not present

## 2021-05-10 DIAGNOSIS — Z79899 Other long term (current) drug therapy: Secondary | ICD-10-CM | POA: Diagnosis not present

## 2021-05-10 DIAGNOSIS — H5461 Unqualified visual loss, right eye, normal vision left eye: Secondary | ICD-10-CM | POA: Diagnosis not present

## 2021-05-10 DIAGNOSIS — E785 Hyperlipidemia, unspecified: Secondary | ICD-10-CM | POA: Diagnosis not present

## 2021-05-10 DIAGNOSIS — Z86718 Personal history of other venous thrombosis and embolism: Secondary | ICD-10-CM | POA: Insufficient documentation

## 2021-05-10 DIAGNOSIS — F17210 Nicotine dependence, cigarettes, uncomplicated: Secondary | ICD-10-CM | POA: Insufficient documentation

## 2021-05-10 DIAGNOSIS — R0602 Shortness of breath: Secondary | ICD-10-CM | POA: Diagnosis not present

## 2021-05-10 DIAGNOSIS — I5032 Chronic diastolic (congestive) heart failure: Secondary | ICD-10-CM

## 2021-05-10 NOTE — Progress Notes (Signed)
[name], female    DOB: 04/15/1946, 75 y.o.   MRN: [id number]  HPI  Lisa [name] is a 75 y/o female with a history of DM, hyperlipidemia, HTN, depression, DVT, vascular disease, tobacco use and chronic heart failure.   Echo report from 02/26/21 reviewed and showed an EF of 60-65% along with mild LVH.   Admitted 04/09/21 due to shortness of breath and cough due to acute on chronic heart failure. Initially placed on bipap but then able to be weaned down to 5L nasal cannula. Cardiology consult obtained. Given IV lasix with transition to oral diuretics. Patient declined cardiac cath. Discharged after 4 days.   She presents today for her initial visit with a chief complaint of very little shortness of breath with moderate exertion. She says that she only gets short of breat if doing a lot of activity or walking too far. No SOB when walking to the office today. She has associated pedal edema, light-headedness & vision loss in the right eye along with this. She denies any difficulty sleeping, abdominal distention, palpitations, chest pain, cough, fatigue or weight gain.   She has not taken any of her medications except her diabetes medication today because she hasn't eaten much.   Glucometer is broken so she's unable to check her glucose. Voices concern about her HF diagnosis and that she's had tests over the years and asking why this has never been brought up before.   Past Medical History:  Diagnosis Date   Blindness of right eye    CHF (congestive heart failure) (HCC)    Depression    Diabetes mellitus without complication (HCC)    DVT of leg (deep venous thrombosis) (HCC)    Glaucoma    Hyperlipidemia    Hypertension    Vascular disease    Past Surgical History:  Procedure Laterality Date   ABDOMINAL HYSTERECTOMY  1992   Total   EYE SURGERY     TOE AMPUTATION Left 2011   All of her toes on the left foot   VASCULAR SURGERY     Family History  Problem Relation Age of  Onset   Heart disease Father    Diabetes Mother    Diabetes Brother    Kidney disease Brother        Transplant   Heart disease Brother    Diabetes Sister    Diabetes Sister    Social History   Tobacco Use   Smoking status: Every Day    Packs/day: 0.25    Years: 40.00    Pack years: 10.00    Types: Cigarettes    Start date: 03/16/2000   Smokeless tobacco: Never  Substance Use Topics   Alcohol use: No    Alcohol/week: 0.0 standard drinks   Allergies  Allergen Reactions   Other Itching    Nucinta   Tapentadol Hcl    Prior to Admission medications   Medication Sig Start Date End Date Taking? Authorizing Provider  aspirin 81 MG tablet Take 81 mg by mouth daily.   Yes [provider]  atropine 1 % ophthalmic solution Place 1 drop into the right eye 3 (three) times daily.   Yes [provider]  brimonidine (ALPHAGAN) 0.2 % ophthalmic solution Place 1 drop into both eyes 2 (two) times daily.   Yes [provider]  citalopram (CELEXA) 20 MG tablet Take 1 tablet (20 mg total) by mouth daily. 02/20/21  Yes Sowles, Danna Hefty, MD  clopidogrel (PLAVIX) 75 MG  tablet Take 1 tablet (75 mg total) by mouth daily. 02/20/21  Yes Sowles, Drue Stager, MD  Cyanocobalamin (B-12) 1000 MCG SUBL Place 1 tablet under the tongue daily. 03/19/21  Yes Myles Gip, DO  dorzolamide-timolol (COSOPT) 22.3-6.8 MG/ML ophthalmic solution SMARTSIG:In Eye(s) 03/02/21  Yes [provider]  furosemide (LASIX) 40 MG tablet Take 1 tablet (40 mg total) by mouth daily. 04/13/21  Yes Fritzi Mandes, MD  JANUVIA 100 MG tablet TAKE 1 TABLET BY MOUTH EVERY DAY 03/01/21  Yes Sowles, Drue Stager, MD  losartan (COZAAR) 100 MG tablet Take 1 tablet (100 mg total) by mouth daily. 02/20/21  Yes Sowles, Drue Stager, MD  metoprolol succinate (TOPROL XL) 50 MG 24 hr tablet Take 1 tablet (50 mg total) by mouth daily. Take with or immediately following a meal. 04/13/21 04/13/22 Yes Fritzi Mandes, MD  Multiple Vitamin  (MULTIVITAMIN) tablet Take 1 tablet by mouth daily.   Yes [provider]  pioglitazone (ACTOS) 15 MG tablet Take 1 tablet (15 mg total) by mouth daily. 02/20/21  Yes Sowles, Drue Stager, MD  potassium chloride (KLOR-CON) 20 MEQ tablet Take 1 tablet (20 mEq total) by mouth daily. 04/13/21  Yes Fritzi Mandes, MD  prednisoLONE acetate (PRED FORTE) 1 % ophthalmic suspension Place into the right eye. 03/07/21  Yes [provider]  rosuvastatin (CRESTOR) 10 MG tablet Take 1 tablet (10 mg total) by mouth daily. 02/20/21  Yes Sowles, Drue Stager, MD  Vitamin D, Ergocalciferol, (DRISDOL) 1.25 MG (50000 UNIT) CAPS capsule TAKE 1 CAPSULE (50,000 UNITS TOTAL) BY MOUTH EVERY 7 (SEVEN) DAYS. 11/16/20  Yes Steele Sizer, MD  blood glucose meter kit and supplies Dispense based on patient and insurance preference. Use up to four times daily as directed. (FOR ICD-10 E10.9, E11.9). Patient not taking: Reported on 05/10/2021 03/19/21   Myles Gip, DO  Blood Glucose Monitoring Suppl (ACCU-CHEK AVIVA CONNECT) w/Device KIT 1 each by Does not apply route daily. And supplies E11.9 check fsbs bid Patient not taking: Reported on 05/10/2021 07/20/18   Steele Sizer, MD  nitroGLYCERIN (NITROSTAT) 0.4 MG SL tablet Place 1 tablet (0.4 mg total) under the tongue every 5 (five) minutes as needed for chest pain. Patient not taking: Reported on 05/10/2021 04/13/21   Fritzi Mandes, MD  psyllium (METAMUCIL) 0.52 g capsule Take 1 capsule (0.52 g total) by mouth daily. Patient not taking: Reported on 05/10/2021 03/19/21   Myles Gip, DO   Review of Systems  Constitutional:  Negative for appetite change and fatigue.  HENT:  Negative for congestion, postnasal drip and sore throat.   Eyes:  Positive for visual disturbance (vision loss in right eye).  Respiratory:  Positive for shortness of breath (with moderate exertion). Negative for cough and chest tightness.   Cardiovascular:  Positive for leg swelling ("very  little"). Negative for chest pain and palpitations.  Gastrointestinal:  Negative for abdominal distention and abdominal pain.  Endocrine: Negative.   Genitourinary: Negative.   Musculoskeletal:  Negative for back pain and neck pain.  Skin: Negative.   Allergic/Immunologic: Negative.   Neurological:  Positive for light-headedness (on occasion). Negative for dizziness.  Hematological:  Negative for adenopathy. Does not bruise/bleed easily.  Psychiatric/Behavioral:  Negative for dysphoric mood and sleep disturbance (sleeping flat). The patient is not nervous/anxious.    Vitals:   05/10/21 1219  BP: (!) 165/79  Pulse: 72  Resp: 16  SpO2: 100%  Weight: 133 lb 4 oz (60.4 kg)  Height: $Remove'5\' 4"'qqtRDFO$  (1.626 m)   Wt Readings from  Last 3 Encounters:  05/10/21 133 lb 4 oz (60.4 kg)  04/13/21 134 lb 7.7 oz (61 kg)  03/19/21 140 lb 8 oz (63.7 kg)   Lab Results  Component Value Date   CREATININE 0.70 04/13/2021   CREATININE 0.73 04/12/2021   CREATININE 0.70 04/11/2021   Physical Exam Vitals and nursing note reviewed.  Constitutional:      Appearance: Normal appearance.  HENT:     Head: Normocephalic and atraumatic.  Cardiovascular:     Rate and Rhythm: Normal rate and regular rhythm.  Pulmonary:     Effort: Pulmonary effort is normal. No respiratory distress.     Breath sounds: No wheezing or rales.  Abdominal:     General: There is no distension.     Palpations: Abdomen is soft.     Tenderness: There is no abdominal tenderness.  Musculoskeletal:        General: No tenderness.     Cervical back: Normal range of motion and neck supple.     Right lower leg: Edema (trace pitting) present.     Left lower leg: Edema (trace pitting) present.  Skin:    General: Skin is warm and dry.  Neurological:     General: No focal deficit present.     Mental Status: She is alert and oriented to person, place, and time.  Psychiatric:        Mood and Affect: Mood normal.        Behavior: Behavior  normal.    Assessment & Plan:  1: Chronic heart failure with preserved ejection fraction along with structural changes (LVH)- - NYHA II - euvolemic today - weighing daily; instructed to call for an overnight weight gain of > 2 pounds or a weekly weight gain of > 5 pounds - not adding salt and is trying to be mindful of sodium intake; reviewed the importance of keeping sodium intake to '2000mg'$  / day; low sodium cookbook provided - has not taken any of her meds (except for diabetes) yet today and it's almost 1pm - spent a lot of time discussing HF diagnosis, what it means and the results of her echo. Explained that her echo looked pretty good and that the key may be keeping her BP under control - did not have an appointment scheduled with cardiology [facility]) so this was scheduled for 06/10/21 - could consider changing her losartan to entresto - BNP 04/09/21 was 378.9  2: HTN- - BP mildly elevated (165/79) but she hasn't taken her medications yet today - saw PCP (Rum[place]) 03/19/21 - BMP 04/13/21 reviewed and showed sodium 137, potassium 4.2, creatinine 0.7 and GFR >60  3: DM- - A1c 02/20/21 was 6.2% - not checking her glucose as her meter is broken; encouraged her to speak with her PCP about obtaining a new one  4: Depression- - under control at this time - currently taking citalopram   Medication list reviewed. She is questioning why she is taking so many medications. Explained that the losartan and metoprolol are good for her heart. She's on jardiance which is also good for the heart although the dose she is on is for her DM. The rest of her medications she will need to discuss with her PCP  Return in 2 months or sooner for any questions/problems before then.

## 2021-05-10 NOTE — Patient Instructions (Addendum)
Continue weighing daily and call for an overnight weight gain of 3 pounds or more or a weekly weight gain of more than 5 pounds.   Dr. [facility]  [address], [place], [place] [address] [phone] 06/10/21 at 230pm

## 2021-05-13 ENCOUNTER — Telehealth: Payer: 59

## 2021-05-17 ENCOUNTER — Telehealth: Payer: Self-pay

## 2021-05-17 NOTE — Telephone Encounter (Signed)
°  Care Management   Follow Up Note   05/17/2021 Name: Lisa [name] MRN: [id number] DOB: 1946-05-09   Primary Care Provider: Alba Cory, MD Reason for referral : Chronic Care Management   An unsuccessful telephone outreach was attempted today. The patient was referred to the case management team for assistance with care management and care coordination.    Follow Up Plan:  A member of the care management team will attempt to reach Lisa [name] within the next two weeks.   [facility] Care Management [facility] [phone]

## 2021-05-25 DIAGNOSIS — I152 Hypertension secondary to endocrine disorders: Secondary | ICD-10-CM

## 2021-05-25 DIAGNOSIS — E1159 Type 2 diabetes mellitus with other circulatory complications: Secondary | ICD-10-CM

## 2021-05-25 DIAGNOSIS — E119 Type 2 diabetes mellitus without complications: Secondary | ICD-10-CM

## 2021-06-01 ENCOUNTER — Other Ambulatory Visit: Payer: Self-pay | Admitting: Family Medicine

## 2021-06-01 DIAGNOSIS — E1151 Type 2 diabetes mellitus with diabetic peripheral angiopathy without gangrene: Secondary | ICD-10-CM

## 2021-06-01 NOTE — Telephone Encounter (Signed)
Requested Prescriptions  Pending Prescriptions Disp Refills   JANUVIA 100 MG tablet [Pharmacy Med Name: JANUVIA 100 MG TABLET] 90 tablet 0    Sig: TAKE 1 TABLET BY MOUTH EVERY DAY     Endocrinology:  Diabetes - DPP-4 Inhibitors Passed - 06/01/2021  9:14 AM      Passed - HBA1C is between 0 and 7.9 and within 180 days    Hemoglobin A1C  Date Value Ref Range Status  02/20/2021 6.2 (A) 4.0 - 5.6 % Final   HbA1c, POC (prediabetic range)  Date Value Ref Range Status  08/16/2019 6.5 (A) 5.7 - 6.4 % Final   Hgb A1c MFr Bld  Date Value Ref Range Status  02/17/2020 8.5 (H) <5.7 % of total Hgb Final    Comment:    For someone without known diabetes, a hemoglobin A1c value of 6.5% or greater indicates that they may have  diabetes and this should be confirmed with a follow-up  test. . For someone with known diabetes, a value <7% indicates  that their diabetes is well controlled and a value  greater than or equal to 7% indicates suboptimal  control. A1c targets should be individualized based on  duration of diabetes, age, comorbid conditions, and  other considerations. . Currently, no consensus exists regarding use of hemoglobin A1c for diagnosis of diabetes for children. .          Passed - Cr in normal range and within 360 days    Creat  Date Value Ref Range Status  03/19/2021 0.70 0.60 - 1.00 mg/dL Final   Creatinine, Ser  Date Value Ref Range Status  04/13/2021 0.70 0.44 - 1.00 mg/dL Final   Creatinine, Urine  Date Value Ref Range Status  02/20/2021 40 20 - 275 mg/dL Final         Passed - Valid encounter within last 6 months    Recent Outpatient Visits          2 months ago Hypokalemia   [facility] [place] D[place] M, DO   3 months ago DM (diabetes mellitus), type 2 with peripheral vascular complications [facility])   [facility] Alba Cory, MD   7 months ago Herpes zoster without complication   [facility]  [place], Elnita Maxwell, NP   7 months ago DM (diabetes mellitus), type 2 with peripheral vascular complications [facility])   [facility] Alba Cory, MD   8 months ago Urinary frequency   [facility] Alba Cory, MD      Future Appointments            In 3 weeks Alba Cory, MD [facility]

## 2021-06-21 NOTE — Progress Notes (Signed)
Name: Lisa [name]   MRN: [id number]    DOB: 1945/10/09   Date:06/25/2021       Progress Note  Subjective  Chief Complaint  Follow Up  HPI  DMII: she is on Actos and Januvia and tolerating it well/ A1C has been at goal, and today is 6.5 % She has hypertension, dyslipidemia and history of urine micro, PAD . She is on Losartan and statin therapy. Eye exam is up to date. Denies polyphagia or polydipsia but has polyuria. She was recently diagnosed with CHF, explained we need to stop Actos and add SGL-2 agonist. Discussed possible side effects of medications    Depression: she is feeling down again, she is frustrated about new onset of CHF and feels like it should have been diagnosed sooner. Reviewed studies done during her two last admissions. Explained that we need to try going down on dose of Citalopram because of drug to drug interaction and risk of cardiac rhythm problems. Consider therapy   HTN/CHF: she was admitted in Nov, she is on Losartan and Metoprolol, she is off Norvasc, taking furosemide in am's She denies side effects of medications. No chest pain, SOB is stable with activity. She states her weight oscillates.   Nocturnal enuresis: she still has problems, she is not interested in seeing Urologist at this time. She states she has the sensation to use bathroom but a lot of times not able to make it in time   PAD: she was recently seen by Dr. Lucky Cowboy, 01/2021 and ABI was 0.67  on the left  and 1.1 on right.  History of left distal foot amputation, she has phantom pain , she has pain on both legs from PAD. She also has a history of neuropathy and states staggers when walking, unchanged   Low vitamin D and B12: taking supplementation   Patient Active Problem List   Diagnosis Date Noted   Acute CHF (congestive heart failure) ([place]) 04/10/2021   Hypertensive emergency 04/09/2021   Chronic diastolic heart failure ([place]) 04/09/2021   Nicotine dependence 04/09/2021   Hypokalemia 02/26/2021    Hypomagnesemia 02/26/2021   Bruising 02/26/2021   Syncope and collapse 02/26/2021   Prolonged QT interval 02/26/2021   Uncontrolled type 2 diabetes with eye complications 13/14/3888   DM (diabetes mellitus), type 2 with peripheral vascular complications ([place]) 75/79/7282   Amputation of left foot ([place]) 12/02/2014   Benign essential HTN 12/02/2014   Depression, major, recurrent, mild ([place]) 12/02/2014   Dyslipidemia 12/02/2014   Vitamin B12 deficiency 12/02/2014   Peripheral neuropathic pain 12/02/2014   Peripheral artery disease ([place]) 12/02/2014   Phantom limb ([place]) 12/02/2014   Diabetes mellitus with nephropathy ([place]) 12/02/2014   Detached retina 12/02/2014   Type 2 diabetes mellitus with peripheral neuropathy ([place]) 12/02/2014   Tobacco use 12/02/2014   Urge incontinence 12/02/2014   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   Vitamin D deficiency 12/02/2014    Past Surgical History:  Procedure Laterality Date   ABDOMINAL HYSTERECTOMY  1992   Total   EYE SURGERY     TOE AMPUTATION Left 2011   All of her toes on the left foot   VASCULAR SURGERY      Family History  Problem Relation Age of Onset   Heart disease Father    Diabetes Mother    Diabetes Brother    Kidney disease Brother        Transplant   Heart disease Brother    Diabetes Sister    Diabetes Sister  Social History   Tobacco Use   Smoking status: Every Day    Packs/day: 0.25    Years: 40.00    Pack years: 10.00    Types: Cigarettes    Start date: 03/16/2000   Smokeless tobacco: Never  Substance Use Topics   Alcohol use: No    Alcohol/week: 0.0 standard drinks     Current Outpatient Medications:    aspirin 81 MG tablet, Take 81 mg by mouth daily., Disp: , Rfl:    atropine 1 % ophthalmic solution, Place 1 drop into the right eye 3 (three) times daily., Disp: , Rfl:    brimonidine (ALPHAGAN) 0.2 % ophthalmic solution, Place 1 drop into both eyes 2 (two) times daily., Disp: , Rfl: 3   Cyanocobalamin (B-12)  1000 MCG SUBL, Place 1 tablet under the tongue daily., Disp: 30 tablet, Rfl: 5   dapagliflozin propanediol (FARXIGA) 10 MG TABS tablet, Take 1 tablet (10 mg total) by mouth daily before breakfast., Disp: 90 tablet, Rfl: 0   dorzolamide-timolol (COSOPT) 22.3-6.8 MG/ML ophthalmic solution, SMARTSIG:In Eye(s), Disp: , Rfl:    furosemide (LASIX) 40 MG tablet, Take 1 tablet (40 mg total) by mouth daily., Disp: 30 tablet, Rfl: 1   JANUVIA 100 MG tablet, TAKE 1 TABLET BY MOUTH EVERY DAY, Disp: 90 tablet, Rfl: 0   metoprolol succinate (TOPROL XL) 50 MG 24 hr tablet, Take 1 tablet (50 mg total) by mouth daily. Take with or immediately following a meal., Disp: 30 tablet, Rfl: 11   Multiple Vitamin (MULTIVITAMIN) tablet, Take 1 tablet by mouth daily., Disp: , Rfl:    nitroGLYCERIN (NITROSTAT) 0.4 MG SL tablet, Place 1 tablet (0.4 mg total) under the tongue every 5 (five) minutes as needed for chest pain., Disp: 15 tablet, Rfl: 12   potassium chloride (KLOR-CON) 20 MEQ tablet, Take 1 tablet (20 mEq total) by mouth daily., Disp: 30 tablet, Rfl: 1   prednisoLONE acetate (PRED FORTE) 1 % ophthalmic suspension, Place into the right eye., Disp: , Rfl:    psyllium (METAMUCIL) 0.52 g capsule, Take 1 capsule (0.52 g total) by mouth daily., Disp: 90 capsule, Rfl: 3   blood glucose meter kit and supplies, Dispense based on patient and insurance preference. Use up to four times daily as directed. (FOR ICD-10 E10.9, E11.9)., Disp: 1 each, Rfl: 0   citalopram (CELEXA) 10 MG tablet, Take 1 tablet (10 mg total) by mouth daily., Disp: 90 tablet, Rfl: 0   clopidogrel (PLAVIX) 75 MG tablet, Take 1 tablet (75 mg total) by mouth daily., Disp: 90 tablet, Rfl: 1   losartan (COZAAR) 100 MG tablet, Take 1 tablet (100 mg total) by mouth daily., Disp: 90 tablet, Rfl: 1   rosuvastatin (CRESTOR) 10 MG tablet, Take 1 tablet (10 mg total) by mouth daily., Disp: 90 tablet, Rfl: 1   Vitamin D, Ergocalciferol, (DRISDOL) 1.25 MG (50000 UNIT)  CAPS capsule, Take 1 capsule (50,000 Units total) by mouth every 7 (seven) days., Disp: 8 capsule, Rfl: 1  Allergies  Allergen Reactions   Other Itching    Nucinta   Tapentadol Hcl    Actos [Pioglitazone]     History of CHF    I personally reviewed active problem list, medication list, allergies, family history, social history, health maintenance with the patient/caregiver today.   ROS  Constitutional: Negative for fever or weight change.  Respiratory: Negative for cough, currently no  shortness of breath.   Cardiovascular: Negative for chest pain or palpitations.  Gastrointestinal: Negative for abdominal  pain, no bowel changes.  Musculoskeletal: positive  for gait problem but no joint swelling.  Skin: Negative for rash.  Neurological: Negative for dizziness or headache.  No other specific complaints in a complete review of systems (except as listed in HPI above).   Objective  Vitals:   06/24/21 1137  BP: 122/78  Pulse: 71  Resp: 16  Temp: 97.9 F (36.6 C)  Weight: 132 lb (59.9 kg)  Height: _0  (1.626 m)     Body mass index is 22.66 kg/m.  Physical Exam  Constitutional: Patient appears well-developed and well-nourished. No distress.  HEENT: head atraumatic, normocephalic, pupils equal and reactive to light, neck supple Cardiovascular: Normal rate, regular rhythm and normal heart sounds.  No murmur heard. No BLE edema. Pulmonary/Chest: Effort normal and breath sounds normal. No respiratory distress. Abdominal: Soft.  There is no tenderness. Psychiatric: Patient has a depressed mood, seems depressed. behavior is normal. Judgment and thought content normal.   Recent Results (from the past 2160 hour(s))  CBC with Differential     Status: Abnormal   Collection Time: 04/09/21  3:36 AM  Result Value Ref Range   WBC 7.1 4.0 - 10.5 K/uL   RBC 3.95 3.87 - 5.11 MIL/uL   Hemoglobin 11.1 (L) 12.0 - 15.0 g/dL   HCT 34.4 (L) 36.0 - 46.0 %   MCV 87.1 80.0 - 100.0 fL   MCH  28.1 26.0 - 34.0 pg   MCHC 32.3 30.0 - 36.0 g/dL   RDW 18.9 (H) 11.5 - 15.5 %   Platelets 265 150 - 400 K/uL   nRBC 0.0 0.0 - 0.2 %   Neutrophils Relative % 86 %   Neutro Abs 6.1 1.7 - 7.7 K/uL   Lymphocytes Relative 7 %   Lymphs Abs 0.5 (L) 0.7 - 4.0 K/uL   Monocytes Relative 7 %   Monocytes Absolute 0.5 0.1 - 1.0 K/uL   Eosinophils Relative 0 %   Eosinophils Absolute 0.0 0.0 - 0.5 K/uL   Basophils Relative 0 %   Basophils Absolute 0.0 0.0 - 0.1 K/uL   Immature Granulocytes 0 %   Abs Immature Granulocytes 0.03 0.00 - 0.07 K/uL    Comment: Performed at [facility], [address] [place], [place] [address]  Basic metabolic panel     Status: Abnormal   Collection Time: 04/09/21  3:36 AM  Result Value Ref Range   Sodium 141 135 - 145 mmol/L   Potassium 2.4 (LL) 3.5 - 5.1 mmol/L    Comment: CRITICAL RESULT CALLED TO, READ BACK BY AND VERIFIED WITH TIA BULLOCK RN 0450 04/09/21 HNM    Chloride 103 98 - 111 mmol/L   CO2 29 22 - 32 mmol/L   Glucose, Bld 166 (H) 70 - 99 mg/dL    Comment: Glucose reference range applies only to samples taken after fasting for at least 8 hours.   BUN 12 8 - 23 mg/dL   Creatinine, Ser 0.56 0.44 - 1.00 mg/dL   Calcium 8.8 (L) 8.9 - 10.3 mg/dL   GFR, Estimated >60 >60 mL/min    Comment: (NOTE) Calculated using the CKD-EPI Creatinine Equation (2021)    Anion gap 9 5 - 15    Comment: Performed at [facility], [address] [place], [place] [address]  Brain natriuretic peptide     Status: Abnormal   Collection Time: 04/09/21  3:36 AM  Result Value Ref Range   B Natriuretic Peptide 378.9 (H) 0.0 - 100.0 pg/mL  Comment: Performed at [facility], [place], [place] [address]  Troponin I (High Sensitivity)     Status: Abnormal   Collection Time: 04/09/21  3:36 AM  Result Value Ref Range   Troponin I (High Sensitivity) 24 (H) <18 ng/L    Comment: (NOTE) Elevated high sensitivity troponin I (hsTnI) values  and significant  changes across serial measurements may suggest ACS but many other  chronic and acute conditions are known to elevate hsTnI results.  Refer to the "Links" section for chest pain algorithms and additional  guidance. Performed at [facility], [address] [place], [place] [address]   Resp Panel by RT-PCR (Flu A&B, Covid) Nasopharyngeal Swab     Status: None   Collection Time: 04/09/21  3:36 AM   Specimen: Nasopharyngeal Swab; Nasopharyngeal(NP) swabs in vial transport medium  Result Value Ref Range   SARS Coronavirus 2 by RT PCR NEGATIVE NEGATIVE    Comment: (NOTE) SARS-CoV-2 target nucleic acids are NOT DETECTED.  The SARS-CoV-2 RNA is generally detectable in upper respiratory specimens during the acute phase of infection. The lowest concentration of SARS-CoV-2 viral copies this assay can detect is 138 copies/mL. A negative result does not preclude SARS-Cov-2 infection and should not be used as the sole basis for treatment or other patient management decisions. A negative result may occur with  improper specimen collection/handling, submission of specimen other than nasopharyngeal swab, presence of viral mutation(s) within the areas targeted by this assay, and inadequate number of viral copies(<138 copies/mL). A negative result must be combined with clinical observations, patient history, and epidemiological information. The expected result is Negative.  Fact Sheet for Patients:  EntrepreneurPulse.com.au  Fact Sheet for Healthcare Providers:  IncredibleEmployment.be  This test is no t yet approved or cleared by the [place] FDA and  has been authorized for detection and/or diagnosis of SARS-CoV-2 by FDA under an Emergency Use Authorization (EUA). This EUA will remain  in effect (meaning this test can be used) for the duration of the COVID-19 declaration under Section 564(b)(1) of the Act, 21 U.S.C.section  360bbb-3(b)(1), unless the authorization is terminated  or revoked sooner.       Influenza A by PCR NEGATIVE NEGATIVE   Influenza B by PCR NEGATIVE NEGATIVE    Comment: (NOTE) The Xpert Xpress SARS-CoV-2/FLU/RSV plus assay is intended as an aid in the diagnosis of influenza from Nasopharyngeal swab specimens and should not be used as a sole basis for treatment. Nasal washings and aspirates are unacceptable for Xpert Xpress SARS-CoV-2/FLU/RSV testing.  Fact Sheet for Patients: EntrepreneurPulse.com.au  Fact Sheet for Healthcare Providers: IncredibleEmployment.be  This test is not yet approved or cleared by the [place] FDA and has been authorized for detection and/or diagnosis of SARS-CoV-2 by FDA under an Emergency Use Authorization (EUA). This EUA will remain in effect (meaning this test can be used) for the duration of the COVID-19 declaration under Section 564(b)(1) of the Act, 21 U.S.C. section 360bbb-3(b)(1), unless the authorization is terminated or revoked.  Performed at [facility], [place], [place] [address]   Troponin I (High Sensitivity)     Status: Abnormal   Collection Time: 04/09/21  5:50 AM  Result Value Ref Range   Troponin I (High Sensitivity) 26 (H) <18 ng/L    Comment: (NOTE) Elevated high sensitivity troponin I (hsTnI) values and significant  changes across serial measurements may suggest ACS but many other  chronic and acute conditions are known to elevate  hsTnI results.  Refer to the "Links" section for chest pain algorithms and additional  guidance. Performed at [facility], [address] [place], [place] [address]   Magnesium     Status: Abnormal   Collection Time: 04/09/21  5:50 AM  Result Value Ref Range   Magnesium 1.5 (L) 1.7 - 2.4 mg/dL    Comment: Performed at [facility], [place], [place] [address]  Troponin I (High Sensitivity)      Status: Abnormal   Collection Time: 04/09/21 10:55 AM  Result Value Ref Range   Troponin I (High Sensitivity) 19 (H) <18 ng/L    Comment: (NOTE) Elevated high sensitivity troponin I (hsTnI) values and significant  changes across serial measurements may suggest ACS but many other  chronic and acute conditions are known to elevate hsTnI results.  Refer to the "Links" section for chest pain algorithms and additional  guidance. Performed at [facility], [place], [place] [address]   Troponin I (High Sensitivity)     Status: None   Collection Time: 04/09/21  1:38 PM  Result Value Ref Range   Troponin I (High Sensitivity) 17 <18 ng/L    Comment: (NOTE) Elevated high sensitivity troponin I (hsTnI) values and significant  changes across serial measurements may suggest ACS but many other  chronic and acute conditions are known to elevate hsTnI results.  Refer to the "Links" section for chest pain algorithms and additional  guidance. Performed at [facility], [address] [place], [place] [address]   Basic metabolic panel     Status: Abnormal   Collection Time: 04/10/21  2:53 PM  Result Value Ref Range   Sodium 135 135 - 145 mmol/L   Potassium 3.1 (L) 3.5 - 5.1 mmol/L   Chloride 99 98 - 111 mmol/L   CO2 28 22 - 32 mmol/L   Glucose, Bld 186 (H) 70 - 99 mg/dL    Comment: Glucose reference range applies only to samples taken after fasting for at least 8 hours.   BUN 12 8 - 23 mg/dL   Creatinine, Ser 0.62 0.44 - 1.00 mg/dL   Calcium 8.4 (L) 8.9 - 10.3 mg/dL   GFR, Estimated >60 >60 mL/min    Comment: (NOTE) Calculated using the CKD-EPI Creatinine Equation (2021)    Anion gap 8 5 - 15    Comment: Performed at [facility], [address] [place], [place] [address]  CBG monitoring, ED     Status: Abnormal   Collection Time: 04/10/21  6:03 PM  Result Value Ref Range   Glucose-Capillary 199 (H) 70 - 99 mg/dL    Comment: Glucose  reference range applies only to samples taken after fasting for at least 8 hours.  CBG monitoring, ED     Status: Abnormal   Collection Time: 04/10/21 11:09 PM  Result Value Ref Range   Glucose-Capillary 182 (H) 70 - 99 mg/dL    Comment: Glucose reference range applies only to samples taken after fasting for at least 8 hours.  Basic metabolic panel     Status: Abnormal   Collection Time: 04/11/21  6:15 AM  Result Value Ref Range   Sodium 135 135 - 145 mmol/L   Potassium 3.1 (L) 3.5 - 5.1 mmol/L   Chloride 97 (L) 98 - 111 mmol/L   CO2 29 22 - 32 mmol/L   Glucose, Bld 116 (H) 70 - 99 mg/dL    Comment: Glucose reference range applies  only to samples taken after fasting for at least 8 hours.   BUN 12 8 - 23 mg/dL   Creatinine, Ser 0.70 0.44 - 1.00 mg/dL   Calcium 8.1 (L) 8.9 - 10.3 mg/dL   GFR, Estimated >60 >60 mL/min    Comment: (NOTE) Calculated using the CKD-EPI Creatinine Equation (2021)    Anion gap 9 5 - 15    Comment: Performed at [facility], [address] [place], [place] [address]  Magnesium     Status: None   Collection Time: 04/11/21  6:15 AM  Result Value Ref Range   Magnesium 1.8 1.7 - 2.4 mg/dL    Comment: Performed at [facility], [address] [place], [place] [address]  CBG monitoring, ED     Status: Abnormal   Collection Time: 04/11/21  8:12 AM  Result Value Ref Range   Glucose-Capillary 113 (H) 70 - 99 mg/dL    Comment: Glucose reference range applies only to samples taken after fasting for at least 8 hours.  CBG monitoring, ED     Status: Abnormal   Collection Time: 04/11/21 12:28 PM  Result Value Ref Range   Glucose-Capillary 65 (L) 70 - 99 mg/dL    Comment: Glucose reference range applies only to samples taken after fasting for at least 8 hours.  CBG monitoring, ED     Status: Abnormal   Collection Time: 04/11/21  5:36 PM  Result Value Ref Range   Glucose-Capillary 61 (L) 70 - 99 mg/dL    Comment: Glucose reference range applies  only to samples taken after fasting for at least 8 hours.  CBG monitoring, ED     Status: None   Collection Time: 04/11/21  5:54 PM  Result Value Ref Range   Glucose-Capillary 78 70 - 99 mg/dL    Comment: Glucose reference range applies only to samples taken after fasting for at least 8 hours.  Basic metabolic panel     Status: Abnormal   Collection Time: 04/12/21  5:23 AM  Result Value Ref Range   Sodium 139 135 - 145 mmol/L   Potassium 3.9 3.5 - 5.1 mmol/L   Chloride 100 98 - 111 mmol/L   CO2 30 22 - 32 mmol/L   Glucose, Bld 172 (H) 70 - 99 mg/dL    Comment: Glucose reference range applies only to samples taken after fasting for at least 8 hours.   BUN 18 8 - 23 mg/dL   Creatinine, Ser 0.73 0.44 - 1.00 mg/dL   Calcium 8.6 (L) 8.9 - 10.3 mg/dL   GFR, Estimated >60 >60 mL/min    Comment: (NOTE) Calculated using the CKD-EPI Creatinine Equation (2021)    Anion gap 9 5 - 15    Comment: Performed at [facility], [address] [place], [place] [address]  Magnesium     Status: None   Collection Time: 04/12/21  5:23 AM  Result Value Ref Range   Magnesium 1.7 1.7 - 2.4 mg/dL    Comment: Performed at [facility], [address] [place], [place] [address]  Glucose, capillary     Status: Abnormal   Collection Time: 04/12/21  7:59 AM  Result Value Ref Range   Glucose-Capillary 129 (H) 70 - 99 mg/dL    Comment: Glucose reference range applies only to samples taken after fasting for at least 8 hours.  CBC     Status: Abnormal   Collection Time: 04/12/21  8:12 AM  Result Value Ref Range  WBC 5.7 4.0 - 10.5 K/uL   RBC 3.35 (L) 3.87 - 5.11 MIL/uL   Hemoglobin 9.5 (L) 12.0 - 15.0 g/dL   HCT 28.9 (L) 36.0 - 46.0 %   MCV 86.3 80.0 - 100.0 fL   MCH 28.4 26.0 - 34.0 pg   MCHC 32.9 30.0 - 36.0 g/dL   RDW 18.9 (H) 11.5 - 15.5 %   Platelets 246 150 - 400 K/uL   nRBC 0.0 0.0 - 0.2 %    Comment: Performed at [facility], [address] [place], [place]  [address]  Glucose, capillary     Status: Abnormal   Collection Time: 04/12/21 12:27 PM  Result Value Ref Range   Glucose-Capillary 118 (H) 70 - 99 mg/dL    Comment: Glucose reference range applies only to samples taken after fasting for at least 8 hours.  Glucose, capillary     Status: Abnormal   Collection Time: 04/12/21  4:24 PM  Result Value Ref Range   Glucose-Capillary 193 (H) 70 - 99 mg/dL    Comment: Glucose reference range applies only to samples taken after fasting for at least 8 hours.  Glucose, capillary     Status: Abnormal   Collection Time: 04/12/21  8:10 PM  Result Value Ref Range   Glucose-Capillary 188 (H) 70 - 99 mg/dL    Comment: Glucose reference range applies only to samples taken after fasting for at least 8 hours.  Basic metabolic panel     Status: Abnormal   Collection Time: 04/13/21  4:17 AM  Result Value Ref Range   Sodium 137 135 - 145 mmol/L   Potassium 4.2 3.5 - 5.1 mmol/L   Chloride 98 98 - 111 mmol/L   CO2 31 22 - 32 mmol/L   Glucose, Bld 185 (H) 70 - 99 mg/dL    Comment: Glucose reference range applies only to samples taken after fasting for at least 8 hours.   BUN 17 8 - 23 mg/dL   Creatinine, Ser 0.70 0.44 - 1.00 mg/dL   Calcium 8.9 8.9 - 10.3 mg/dL   GFR, Estimated >60 >60 mL/min    Comment: (NOTE) Calculated using the CKD-EPI Creatinine Equation (2021)    Anion gap 8 5 - 15    Comment: Performed at [facility], [address] [place], [place] [address]  Magnesium     Status: None   Collection Time: 04/13/21  4:17 AM  Result Value Ref Range   Magnesium 1.8 1.7 - 2.4 mg/dL    Comment: Performed at [facility], [address] [place], [place] [address]  Phosphorus     Status: None   Collection Time: 04/13/21  4:17 AM  Result Value Ref Range   Phosphorus 4.1 2.5 - 4.6 mg/dL    Comment: Performed at [facility], [address] [place], [place] [address]  Glucose, capillary     Status: Abnormal   Collection  Time: 04/13/21  7:59 AM  Result Value Ref Range   Glucose-Capillary 163 (H) 70 - 99 mg/dL    Comment: Glucose reference range applies only to samples taken after fasting for at least 8 hours.  Glucose, capillary     Status: Abnormal   Collection Time: 04/13/21 11:32 AM  Result Value Ref Range   Glucose-Capillary 140 (H) 70 - 99 mg/dL    Comment: Glucose reference range applies only to samples taken after fasting for at least 8 hours.  POCT HgB A1C     Status:  Abnormal   Collection Time: 06/24/21 11:50 AM  Result Value Ref Range   Hemoglobin A1C 6.5 (A) 4.0 - 5.6 %   HbA1c POC (<> result, manual entry)     HbA1c, POC (prediabetic range)     HbA1c, POC (controlled diabetic range)    CBC with Differential/Platelet     Status: Abnormal   Collection Time: 06/24/21 12:31 PM  Result Value Ref Range   WBC 3.7 (L) 3.8 - 10.8 Thousand/uL   RBC 3.79 (L) 3.80 - 5.10 Million/uL   Hemoglobin 10.5 (L) 11.7 - 15.5 g/dL   HCT 33.0 (L) 35.0 - 45.0 %   MCV 87.1 80.0 - 100.0 fL   MCH 27.7 27.0 - 33.0 pg   MCHC 31.8 (L) 32.0 - 36.0 g/dL   RDW 16.6 (H) 11.0 - 15.0 %   Platelets 223 140 - 400 Thousand/uL   MPV 12.0 7.5 - 12.5 fL   Neutro Abs 2,538 1,500 - 7,800 cells/uL   Lymphs Abs 796 (L) 850 - 3,900 cells/uL   Absolute Monocytes 329 200 - 950 cells/uL   Eosinophils Absolute 19 15 - 500 cells/uL   Basophils Absolute 19 0 - 200 cells/uL   Neutrophils Relative % 68.6 %   Total Lymphocyte 21.5 %   Monocytes Relative 8.9 %   Eosinophils Relative 0.5 %   Basophils Relative 0.5 %  COMPLETE METABOLIC PANEL WITH GFR     Status: Abnormal   Collection Time: 06/24/21 12:31 PM  Result Value Ref Range   Glucose, Bld 119 (H) 65 - 99 mg/dL    Comment: .            Fasting reference interval . For someone without known diabetes, a glucose value between 100 and 125 mg/dL is consistent with prediabetes and should be confirmed with a follow-up test. .    BUN 13 7 - 25 mg/dL   Creat 0.86 0.60 - 1.00  mg/dL   eGFR 70 > OR = 60 mL/min/1.73m    Comment: The eGFR is based on the CKD-EPI 2021 equation. To calculate  the new eGFR from a previous Creatinine or Cystatin C result, go to https://www.kidney.org/professionals/ kdoqi/gfr%[email or web address]    BUN/Creatinine Ratio NOT APPLICABLE 6 - 22 (calc)   Sodium 143 135 - 146 mmol/L   Potassium 3.2 (L) 3.5 - 5.3 mmol/L   Chloride 107 98 - 110 mmol/L   CO2 31 20 - 32 mmol/L   Calcium 8.9 8.6 - 10.4 mg/dL   Total Protein 6.4 6.1 - 8.1 g/dL   Albumin 3.7 3.6 - 5.1 g/dL   Globulin 2.7 1.9 - 3.7 g/dL (calc)   AG Ratio 1.4 1.0 - 2.5 (calc)   Total Bilirubin 0.4 0.2 - 1.2 mg/dL   Alkaline phosphatase (APISO) 67 37 - 153 U/L   AST 15 10 - 35 U/L   ALT 8 6 - 29 U/L  Iron, TIBC and Ferritin Panel     Status: Abnormal   Collection Time: 06/24/21 12:31 PM  Result Value Ref Range   Iron 49 45 - 160 mcg/dL   TIBC 384 250 - 450 mcg/dL (calc)   %SAT 13 (L) 16 - 45 % (calc)   Ferritin 31 16 - 288 ng/mL  Magnesium     Status: None   Collection Time: 06/24/21 12:31 PM  Result Value Ref Range   Magnesium 1.6 1.5 - 2.5 mg/dL  TSH     Status: None   Collection Time: 06/24/21 12:31 PM  Result Value  Ref Range   TSH 4.49 0.40 - 4.50 mIU/L     PHQ2/9: Depression screen [facility] 2/9 06/24/2021 05/10/2021 03/26/2021 03/21/2021 03/19/2021  Decreased Interest 1 0 0 0 0  Down, Depressed, Hopeless 1 0 0 0 0  PHQ - 2 Score 2 0 0 0 0  Altered sleeping 0 - - - 0  Tired, decreased energy 0 - - - 0  Change in appetite 0 - - - 0  Feeling bad or failure about yourself  0 - - - 0  Trouble concentrating 0 - - - 0  Moving slowly or fidgety/restless 0 - - - 0  Suicidal thoughts 0 - - - 0  PHQ-9 Score 2 - - - 0  Difficult doing work/chores - - - - Not difficult at all  Some recent data might be hidden    phq 9 is positive   Fall Risk: Fall Risk  05/10/2021 03/19/2021 02/20/2021 10/25/2020 10/17/2020  Falls in the past year? _0 0 1  Number falls in past yr: _1 0 0   Comment - - - - -  Injury with Fall? 0 1 0 0 0  Comment - - - - -  Risk for fall due to : History of fall(s);Impaired balance/gait;Impaired mobility Impaired balance/gait Impaired balance/gait - History of fall(s)  Follow up Falls evaluation completed;Education provided;Falls prevention discussed Falls prevention discussed Falls prevention discussed - Falls prevention discussed      Functional Status Survey: Is the patient deaf or have difficulty hearing?: Yes Does the patient have difficulty seeing, even when wearing glasses/contacts?: Yes Does the patient have difficulty concentrating, remembering, or making decisions?: No Does the patient have difficulty walking or climbing stairs?: Yes Does the patient have difficulty dressing or bathing?: No Does the patient have difficulty doing errands alone such as visiting a doctor's office or shopping?: No    Assessment & Plan 1. Type 2 diabetes mellitus without complication, without long-term current use of insulin (HCC)  - POCT HgB A1C - blood glucose meter kit and supplies; Dispense based on patient and insurance preference. Use up to four times daily as directed. (FOR ICD-10 E10.9, E11.9).  Dispense: 1 each; Refill: 0 - dapagliflozin propanediol (FARXIGA) 10 MG TABS tablet; Take 1 tablet (10 mg total) by mouth daily before breakfast.  Dispense: 90 tablet; Refill: 0  2. Amputation of left foot, subsequent encounter ([place])   3. Hypertension associated with type 2 diabetes mellitus ([place])   4. Vitamin B12 deficiency   5. Phantom limb ([place])   6. Benign essential HTN  - COMPLETE METABOLIC PANEL WITH GFR  7. Dyslipidemia  - rosuvastatin (CRESTOR) 10 MG tablet; Take 1 tablet (10 mg total) by mouth daily.  Dispense: 90 tablet; Refill: 1  8. Depression, major, recurrent, mild (HCC)  - citalopram (CELEXA) 10 MG tablet; Take 1 tablet (10 mg total) by mouth daily.  Dispense: 90 tablet; Refill: 0  9. Chronic diastolic heart failure  ([place])   10. Peripheral artery disease (HCC)  - clopidogrel (PLAVIX) 75 MG tablet; Take 1 tablet (75 mg total) by mouth daily.  Dispense: 90 tablet; Refill: 1 - rosuvastatin (CRESTOR) 10 MG tablet; Take 1 tablet (10 mg total) by mouth daily.  Dispense: 90 tablet; Refill: 1  11. Diabetes mellitus with nephropathy (HCC)  - losartan (COZAAR) 100 MG tablet; Take 1 tablet (100 mg total) by mouth daily.  Dispense: 90 tablet; Refill: 1  12. Vitamin D deficiency  - Vitamin D, Ergocalciferol, (  DRISDOL) 1.25 MG (50000 UNIT) CAPS capsule; Take 1 capsule (50,000 Units total) by mouth every 7 (seven) days.  Dispense: 8 capsule; Refill: 1  13. Low magnesium level  - Magnesium  14. Anemia, unspecified type  - CBC with Differential/Platelet - Iron, TIBC and Ferritin Panel  15. Abnormal TSH  - TSH

## 2021-06-24 ENCOUNTER — Ambulatory Visit (INDEPENDENT_AMBULATORY_CARE_PROVIDER_SITE_OTHER): Payer: Medicare Other | Admitting: Family Medicine

## 2021-06-24 ENCOUNTER — Encounter: Payer: Self-pay | Admitting: Family Medicine

## 2021-06-24 VITALS — BP 122/78 | HR 71 | Temp 97.9°F | Resp 16 | Ht 64.0 in | Wt 132.0 lb

## 2021-06-24 DIAGNOSIS — I739 Peripheral vascular disease, unspecified: Secondary | ICD-10-CM | POA: Diagnosis not present

## 2021-06-24 DIAGNOSIS — S98912D Complete traumatic amputation of left foot, level unspecified, subsequent encounter: Secondary | ICD-10-CM

## 2021-06-24 DIAGNOSIS — E1121 Type 2 diabetes mellitus with diabetic nephropathy: Secondary | ICD-10-CM

## 2021-06-24 DIAGNOSIS — E1159 Type 2 diabetes mellitus with other circulatory complications: Secondary | ICD-10-CM

## 2021-06-24 DIAGNOSIS — I5032 Chronic diastolic (congestive) heart failure: Secondary | ICD-10-CM

## 2021-06-24 DIAGNOSIS — G547 Phantom limb syndrome without pain: Secondary | ICD-10-CM

## 2021-06-24 DIAGNOSIS — E785 Hyperlipidemia, unspecified: Secondary | ICD-10-CM

## 2021-06-24 DIAGNOSIS — D649 Anemia, unspecified: Secondary | ICD-10-CM | POA: Diagnosis not present

## 2021-06-24 DIAGNOSIS — R790 Abnormal level of blood mineral: Secondary | ICD-10-CM

## 2021-06-24 DIAGNOSIS — I10 Essential (primary) hypertension: Secondary | ICD-10-CM | POA: Diagnosis not present

## 2021-06-24 DIAGNOSIS — R7989 Other specified abnormal findings of blood chemistry: Secondary | ICD-10-CM

## 2021-06-24 DIAGNOSIS — E119 Type 2 diabetes mellitus without complications: Secondary | ICD-10-CM

## 2021-06-24 DIAGNOSIS — E559 Vitamin D deficiency, unspecified: Secondary | ICD-10-CM | POA: Diagnosis not present

## 2021-06-24 DIAGNOSIS — E038 Other specified hypothyroidism: Secondary | ICD-10-CM | POA: Diagnosis not present

## 2021-06-24 DIAGNOSIS — I152 Hypertension secondary to endocrine disorders: Secondary | ICD-10-CM

## 2021-06-24 DIAGNOSIS — E538 Deficiency of other specified B group vitamins: Secondary | ICD-10-CM

## 2021-06-24 DIAGNOSIS — F330 Major depressive disorder, recurrent, mild: Secondary | ICD-10-CM | POA: Diagnosis not present

## 2021-06-24 LAB — IRON,TIBC AND FERRITIN PANEL
%SAT: 13 % (calc) — ABNORMAL LOW (ref 16–45)
Ferritin: 31 ng/mL (ref 16–288)
Iron: 49 ug/dL (ref 45–160)
TIBC: 384 mcg/dL (calc) (ref 250–450)

## 2021-06-24 LAB — COMPLETE METABOLIC PANEL WITH GFR
AG Ratio: 1.4 (calc) (ref 1.0–2.5)
ALT: 8 U/L (ref 6–29)
AST: 15 U/L (ref 10–35)
Albumin: 3.7 g/dL (ref 3.6–5.1)
Alkaline phosphatase (APISO): 67 U/L (ref 37–153)
BUN: 13 mg/dL (ref 7–25)
CO2: 31 mmol/L (ref 20–32)
Calcium: 8.9 mg/dL (ref 8.6–10.4)
Chloride: 107 mmol/L (ref 98–110)
Creat: 0.86 mg/dL (ref 0.60–1.00)
Globulin: 2.7 g/dL (calc) (ref 1.9–3.7)
Glucose, Bld: 119 mg/dL — ABNORMAL HIGH (ref 65–99)
Potassium: 3.2 mmol/L — ABNORMAL LOW (ref 3.5–5.3)
Sodium: 143 mmol/L (ref 135–146)
Total Bilirubin: 0.4 mg/dL (ref 0.2–1.2)
Total Protein: 6.4 g/dL (ref 6.1–8.1)
eGFR: 70 mL/min/{1.73_m2} (ref >=60)

## 2021-06-24 LAB — CBC WITH DIFFERENTIAL/PLATELET
Absolute Monocytes: 329 cells/uL (ref 200–950)
Basophils Absolute: 19 cells/uL (ref 0–200)
Basophils Relative: 0.5 %
Eosinophils Absolute: 19 cells/uL (ref 15–500)
Eosinophils Relative: 0.5 %
HCT: 33 % — ABNORMAL LOW (ref 35.0–45.0)
Hemoglobin: 10.5 g/dL — ABNORMAL LOW (ref 11.7–15.5)
Lymphs Abs: 796 cells/uL — ABNORMAL LOW (ref 850–3900)
MCH: 27.7 pg (ref 27.0–33.0)
MCHC: 31.8 g/dL — ABNORMAL LOW (ref 32.0–36.0)
MCV: 87.1 fL (ref 80.0–100.0)
MPV: 12 fL (ref 7.5–12.5)
Monocytes Relative: 8.9 %
Neutro Abs: 2538 cells/uL (ref 1500–7800)
Neutrophils Relative %: 68.6 %
Platelets: 223 10*3/uL (ref 140–400)
RBC: 3.79 10*6/uL — ABNORMAL LOW (ref 3.80–5.10)
RDW: 16.6 % — ABNORMAL HIGH (ref 11.0–15.0)
Total Lymphocyte: 21.5 %
WBC: 3.7 10*3/uL — ABNORMAL LOW (ref 3.8–10.8)

## 2021-06-24 LAB — POCT GLYCOSYLATED HEMOGLOBIN (HGB A1C): Hemoglobin A1C: 6.5 % — AB (ref 4.0–5.6)

## 2021-06-24 LAB — MAGNESIUM: Magnesium: 1.6 mg/dL (ref 1.5–2.5)

## 2021-06-24 LAB — TSH: TSH: 4.49 mIU/L (ref 0.40–4.50)

## 2021-06-24 MED ORDER — BLOOD GLUCOSE METER KIT: PACK | 0 refills | Status: DC

## 2021-06-24 MED ORDER — DAPAGLIFLOZIN PROPANEDIOL 10 MG PO TABS
10.0000 mg | ORAL_TABLET | Freq: Every day | ORAL | 0 refills | Status: DC
Start: 2021-06-24 — End: 2021-07-30

## 2021-06-24 MED ORDER — CLOPIDOGREL BISULFATE 75 MG PO TABS: 75.0000 mg | ORAL_TABLET | Freq: Every day | ORAL | 1 refills | Status: DC

## 2021-06-24 MED ORDER — VITAMIN D (ERGOCALCIFEROL) 1.25 MG (50000 UNIT) PO CAPS: 50000.0000 [IU] | ORAL_CAPSULE | ORAL | 1 refills | Status: DC

## 2021-06-24 MED ORDER — CITALOPRAM HYDROBROMIDE 10 MG PO TABS: 10.0000 mg | ORAL_TABLET | Freq: Every day | ORAL | 0 refills | Status: DC

## 2021-06-24 MED ORDER — LOSARTAN POTASSIUM 100 MG PO TABS: 100.0000 mg | ORAL_TABLET | Freq: Every day | ORAL | 1 refills | Status: DC

## 2021-06-24 MED ORDER — ROSUVASTATIN CALCIUM 10 MG PO TABS: 10.0000 mg | ORAL_TABLET | Freq: Every day | ORAL | 1 refills | Status: DC

## 2021-06-27 ENCOUNTER — Telehealth: Payer: Self-pay | Admitting: *Deleted

## 2021-06-27 ENCOUNTER — Telehealth: Payer: Self-pay

## 2021-06-27 NOTE — Telephone Encounter (Signed)
°  Care Management   Follow Up Note   06/27/2021 Name: Lisa [name] MRN: [id number] DOB: [birth year]   Primary Care Provider: Alba Cory, MD Reason for referral : Chronic Care Management   An unsuccessful telephone outreach was attempted today. The patient was referred to the case management team for assistance with care management and care coordination.   Follow Up Plan:  A member of the care management team will reach out to [name] within the next two weeks.   [facility] Care Management [facility] [phone]

## 2021-06-27 NOTE — Chronic Care Management (AMB) (Signed)
°  Care Management   Note  06/27/2021 Name: Lisa [name] MRN: [id number] DOB: 1946-01-22  Lisa [name] is a 76 y.o. year old female who is a primary care patient of Alba Cory, MD and is actively engaged with the care management team. I reached out to [name] by phone today to assist with re-scheduling a follow up visit with the RN Case Manager  Follow up plan: Unsuccessful telephone outreach attempt made. A HIPAA compliant phone message was left for the patient providing contact information and requesting a return call.   Burman Nieves, CCMA Care Guide, Embedded Care Coordination [facility]   Care Management  Direct Dial: [phone]

## 2021-06-28 ENCOUNTER — Other Ambulatory Visit: Payer: Self-pay | Admitting: Family Medicine

## 2021-06-28 ENCOUNTER — Telehealth: Payer: Self-pay

## 2021-06-28 MED ORDER — POTASSIUM CHLORIDE CRYS ER 20 MEQ PO TBCR: 20.0000 meq | EXTENDED_RELEASE_TABLET | Freq: Every day | ORAL | 1 refills | Status: DC

## 2021-06-28 NOTE — Telephone Encounter (Signed)
Spoke with patient, relayed lab results/advice per Dr. Carlynn Purl. Patient verbalized understanding and had no questions or concerns to express at this time.

## 2021-07-01 NOTE — Telephone Encounter (Signed)
Left voicemail for patient to return call to update on Dr. Vivianne Master recommendation for medication.

## 2021-07-02 ENCOUNTER — Other Ambulatory Visit: Payer: Self-pay | Admitting: Family Medicine

## 2021-07-02 DIAGNOSIS — E119 Type 2 diabetes mellitus without complications: Secondary | ICD-10-CM

## 2021-07-11 ENCOUNTER — Ambulatory Visit: Payer: 59 | Admitting: Family

## 2021-07-17 NOTE — Chronic Care Management (AMB) (Signed)
°  Care Management   Note  07/17/2021 Name: Indy Prestwood MRN: [id number] DOB: [birth year]  Hertha Gergen is a 76 y.o. year old female who is a primary care patient of Alba Cory, MD and is actively engaged with the care management team. I reached out to [name] by phone today to assist with re-scheduling a follow up visit with the RN Case Manager  Follow up plan: 2nd Unsuccessful telephone outreach attempt made. A HIPAA compliant phone message was left for the patient providing contact information and requesting a return call.   Burman Nieves, CCMA Care Guide, Embedded Care Coordination [facility]   Care Management  Direct Dial: [phone]

## 2021-07-18 NOTE — Chronic Care Management (AMB) (Signed)
°  Care Management   Note  07/18/2021 Name: Lisa [name] MRN: [id number] DOB: February 08, 1946  Lisa [name] is a 76 y.o. year old female who is a primary care patient of Alba Cory, MD and is actively engaged with the care management team. I reached out to [name] by phone today to assist with re-scheduling a follow up visit with the RN Case Manager  Follow up plan: Telephone appointment with care management team member scheduled for: 07/24/2021  Burman Nieves, CCMA Care Guide, Embedded Care Coordination [facility]   Care Management  Direct Dial: [phone]

## 2021-07-24 ENCOUNTER — Other Ambulatory Visit: Payer: Self-pay | Admitting: Family Medicine

## 2021-07-24 ENCOUNTER — Telehealth: Payer: Self-pay

## 2021-07-24 ENCOUNTER — Telehealth: Payer: 59

## 2021-07-24 NOTE — Telephone Encounter (Signed)
?  Care Management  ? ?Follow Up Note ? ? ?07/24/2021 ?Name: Lisa [name] MRN: [id number] DOB: 1945/08/14 ? ? ?Primary Care Provider: Alba Cory, MD ?Reason for referral : Chronic Care Management ? ? ?An unsuccessful telephone outreach was attempted today. The patient was referred to the case management team for assistance with care management and care coordination.  ? ?Follow Up Plan:  ?A HIPAA compliant voice message was left today requesting a return call. ? ? ?Glorie Dowlen,RN ?[place]/THN Care Management ?[facility] ?[phone] ? ?

## 2021-07-25 ENCOUNTER — Telehealth: Payer: Self-pay

## 2021-07-25 ENCOUNTER — Ambulatory Visit (INDEPENDENT_AMBULATORY_CARE_PROVIDER_SITE_OTHER): Payer: 59

## 2021-07-25 DIAGNOSIS — E1159 Type 2 diabetes mellitus with other circulatory complications: Secondary | ICD-10-CM

## 2021-07-25 DIAGNOSIS — E119 Type 2 diabetes mellitus without complications: Secondary | ICD-10-CM

## 2021-07-25 DIAGNOSIS — I5032 Chronic diastolic (congestive) heart failure: Secondary | ICD-10-CM

## 2021-07-25 DIAGNOSIS — I152 Hypertension secondary to endocrine disorders: Secondary | ICD-10-CM

## 2021-07-25 NOTE — Chronic Care Management (AMB) (Signed)
Chronic Care Management   CCM RN Visit Note  07/25/2021 Name: Lisa [name] MRN: [id number] DOB: July 18, 1945  Subjective: Lisa [name] is a 76 y.o. year old female who is a primary care patient of Steele Sizer, MD. The care management team was consulted for assistance with disease management and care coordination needs.    Engaged with patient by telephone for follow up visit in response to provider referral for case management and care coordination services.   Consent to Services:  The patient was given information about Chronic Care Management services, agreed to services, and gave verbal consent prior to initiation of services.  Please see initial visit note for detailed documentation.  . Assessment: Review of patient past medical history, allergies, medications, health status, including review of consultants reports, laboratory and other test data, was performed as part of comprehensive evaluation and provision of chronic care management services.   CCM Care Plan  Allergies  Allergen Reactions   Other Itching    Nucinta   Tapentadol Hcl    Actos [Pioglitazone]     History of CHF    Outpatient Encounter Medications as of 07/25/2021  Medication Sig   dapagliflozin propanediol (FARXIGA) 10 MG TABS tablet Take 1 tablet (10 mg total) by mouth daily before breakfast.   furosemide (LASIX) 40 MG tablet Take 1 tablet (40 mg total) by mouth daily.   JANUVIA 100 MG tablet TAKE 1 TABLET BY MOUTH EVERY DAY   losartan (COZAAR) 100 MG tablet Take 1 tablet (100 mg total) by mouth daily.   metoprolol succinate (TOPROL XL) 50 MG 24 hr tablet Take 1 tablet (50 mg total) by mouth daily. Take with or immediately following a meal.   ACCU-CHEK GUIDE test strip DISPENSE BASED ON PATIENT AND INSURANCE PREFERENCE. USE UP TO FOUR TIMES DAILY AS DIRECTED. (FOR ICD-10 E10.9, E11.9).   aspirin 81 MG tablet Take 81 mg by mouth daily.   atropine 1 % ophthalmic solution Place 1 drop into the right eye 3  (three) times daily.   blood glucose meter kit and supplies Dispense based on patient and insurance preference. Use up to four times daily as directed. (FOR ICD-10 E10.9, E11.9).   brimonidine (ALPHAGAN) 0.2 % ophthalmic solution Place 1 drop into both eyes 2 (two) times daily.   citalopram (CELEXA) 10 MG tablet Take 1 tablet (10 mg total) by mouth daily.   clopidogrel (PLAVIX) 75 MG tablet Take 1 tablet (75 mg total) by mouth daily.   Cyanocobalamin (B-12) 1000 MCG SUBL Place 1 tablet under the tongue daily.   dorzolamide-timolol (COSOPT) 22.3-6.8 MG/ML ophthalmic solution SMARTSIG:In Eye(s)   KLOR-CON M20 20 MEQ tablet TAKE 1-2 TABLETS (20-40 MEQ TOTAL) BY MOUTH DAILY.   Multiple Vitamin (MULTIVITAMIN) tablet Take 1 tablet by mouth daily.   nitroGLYCERIN (NITROSTAT) 0.4 MG SL tablet Place 1 tablet (0.4 mg total) under the tongue every 5 (five) minutes as needed for chest pain.   prednisoLONE acetate (PRED FORTE) 1 % ophthalmic suspension Place into the right eye.   psyllium (METAMUCIL) 0.52 g capsule Take 1 capsule (0.52 g total) by mouth daily.   rosuvastatin (CRESTOR) 10 MG tablet Take 1 tablet (10 mg total) by mouth daily.   Vitamin D, Ergocalciferol, (DRISDOL) 1.25 MG (50000 UNIT) CAPS capsule Take 1 capsule (50,000 Units total) by mouth every 7 (seven) days.   No facility-administered encounter medications on file as of 07/25/2021.    Patient Active Problem List   Diagnosis Date Noted   Acute CHF (congestive  heart failure) ([place]) 04/10/2021   Hypertensive emergency 04/09/2021   Chronic diastolic heart failure ([place]) 04/09/2021   Nicotine dependence 04/09/2021   Hypokalemia 02/26/2021   Hypomagnesemia 02/26/2021   Bruising 02/26/2021   Syncope and collapse 02/26/2021   Prolonged QT interval 02/26/2021   Uncontrolled type 2 diabetes with eye complications 56/81/2751   DM (diabetes mellitus), type 2 with peripheral vascular complications ([place]) 70/05/7492   Amputation of left foot ([place])  12/02/2014   Benign essential HTN 12/02/2014   Depression, major, recurrent, mild ([place]) 12/02/2014   Dyslipidemia 12/02/2014   Vitamin B12 deficiency 12/02/2014   Peripheral neuropathic pain 12/02/2014   Peripheral artery disease ([place]) 12/02/2014   Phantom limb ([place]) 12/02/2014   Diabetes mellitus with nephropathy ([place]) 12/02/2014   Detached retina 12/02/2014   Type 2 diabetes mellitus with peripheral neuropathy ([place]) 12/02/2014   Tobacco use 12/02/2014   Urge incontinence 12/02/2014   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 Patient will work with [employer] care guide to address needs related to  Financial constraints related to nutrition and utilities through collaboration with Consulting civil engineer, provider, and care team.   Interventions: 1:1 collaboration with primary care provider regarding development and update of comprehensive plan of care as evidenced by provider attestation and co-signature Inter-disciplinary care team collaboration (see longitudinal plan of care) Evaluation of current treatment plan related to  self management and patient's adherence to plan as established by provider   Hypertension Interventions: BP Readings from Last 3 Encounters:  06/24/21 122/78  05/10/21 (!) 165/79  04/13/21 135/67  Reviewed plan for hypertension management medications and importance of compliance.  Reviewed established blood pressure parameters. Advised to monitor and  record readings. Reviewed indications for notifying a provider.  Reviewed importance of complying with a cardiac prudent/heart healthy diet. She is currently receiving delivered meals. Reports monitoring sodium content as advised. Advised to avoid highly processed foods when possible. Reviewed s/sx of heart attack, stroke and worsening symptoms that require immediate medical attention.  Falls Interventions: Reviewed safety and fall prevention. Denies recent falls but expressed concerns regarding toe discomfort and swelling to her right foot. She is requesting a referral for Podiatry. She plans to discuss further during her clinic visit on 07/30/2021. Advised to continue using a cane or walker to prevent accidental falls. Reviewed current functional status. Reports performing all ADLs independently. Reports family is available to assist with errand and provide transportation when needed. Declines need for additional assistance in the home.    Diabetes Interventions: Lab Results  Component Value Date   HGBA1C 6.5 (A) 06/24/2021  Reviewed plan for diabetes management. Reports taking medications as prescribed. Reports blood glucose readings have ranged from the low 100's to 120's. Reports not monitoring consistently. Advised to monitor daily and record readings. Reviewed s/sx of hypoglycemia and hyperglycemia along with recommended interventions. Reviewed nutritional intake and importance of complying with a diabetic/modified carb diet. She is receiving delivered meals.  Reviewed importance of completing  recommended DM preventive care. Reports her eye exam is up to date. She notes changes to her left foot and expressed interest in following up with a Podiatrist. She will have the site evaluated during her upcoming clinic appointment. Will inform her provider regarding request for Podiatry.    Wellness/Community Resource Interventions: Discussed concerns related to utility bills and financial strain.  She was previously referred to the [facility] and received assistance. She is requesting additional outreach due to discuss options for additional assistance. New referral submitted for outreach with a [employer].    Patient Goals/Self-Care Activities: Take all  medications as prescribed Attend all scheduled provider appointments Contact pharmacy for medication refills Contact the clinic for questions and new concerns as needed       PLAN Patient will contact the clinic or call the care management team if she requires additional assistance.   [facility] Care Management [facility] [phone]

## 2021-07-25 NOTE — Telephone Encounter (Signed)
?  Care Management  ? ?Follow Up Note ? ? ?07/25/2021 ?Name: Klohe Lovering MRN: [id number] DOB: [birth year] ? ? ?Primary Care Provider: Alba Cory, MD ?Reason for referral : Chronic Care Management ? ? ?An unsuccessful telephone outreach was attempted today. The patient was referred to the case management team for assistance with care management and care coordination.  ? ? ?Follow Up Plan:  ?A HIPAA compliant voice message was left today requesting a return call. ? ? ?Jaiveon Suppes,RN ?[place]/THN Care Management ?[facility] ?[phone] ? ? ?

## 2021-07-29 ENCOUNTER — Telehealth: Payer: Self-pay

## 2021-07-29 NOTE — Telephone Encounter (Signed)
? ?  Telephone encounter was:  Successful.  ?07/29/2021 ?Name: Lisa [name] MRN: [id number] DOB: 1945/11/21 ? ?Lisa [name] is a 76 y.o. year old female who is a primary care patient of Alba Cory, MD . The community resource team was consulted for assistance with Financial Difficulties related to utilities. ? ?Care guide performed the following interventions: Spoke with patient she has contacted the low income energy assistance and the [employer] assistance and they were not able to assist her. Her utility companies are working with her and she is paying on them. Patient is not eligible for food stamps but received Meals on Wheels. I suggested checking with her mortgage company to possibly refinance her home, checking with [employer] for senior housing, weatherization program to possibly lower her utility bills.  Patient stated she was not interested in information.  She has my contact information. ? ?Follow Up Plan:  No further follow up planned at this time. The patient has been provided with needed resources. ? ?Lisa [name], AAS Paralegal, CHC ?Care Guide  Embedded Care Coordination ?[place]  Care Management  ?[address] ?[place], [place] [address] ?[email or web address]  ?? [phone]   ?[email or web address] ?  ?

## 2021-07-29 NOTE — Progress Notes (Signed)
Name: Lisa [name]   MRN: [id number]    DOB: 07/08/1945   Date:07/30/2021       Progress Note  Subjective  Chief Complaint  Medication Review  HPI  DMII: she was supposed to bring all of her medications today for medication reconciliation but she forgot to do so. Today she has two main complaints. She has long and thick toenails on right foot and now having some soreness with pressure of right first toe ( she has a history of PAD and sees Dr. Lucky Cowboy in a couple of weeks). She continues to have urinary frequency ( it may have gotten worse due to sGL-2 agonist) but explained we will have to review her medications in person to make adjustments. She is feeling lightheaded. BP was low today. Explained to reviewed dose of losartan that she has at home and take half ( likely has 100 mg and advised to take 50 mg) drink water to stay hydrated , return in 2 weeks for follow up. She denies polyphagia , she feels thirsty and has polyuria   Malnutrition: she lost another 9 lbs since last visit, she is states she continues to have a change in bowel movements, 5 loose bowel movements per day sometimes bowel incontinence but not recently  , she saw GI back in August she refuses some of the testings recommended by Dr. Zenovia Jarred but agreed on colonoscopy but she never scheduled it . Explained importance of going back to follow up   Patient Active Problem List   Diagnosis Date Noted   Acute CHF (congestive heart failure) ([place]) 04/10/2021   Hypertensive emergency 04/09/2021   Chronic diastolic heart failure ([place]) 04/09/2021   Nicotine dependence 04/09/2021   Hypokalemia 02/26/2021   Hypomagnesemia 02/26/2021   Bruising 02/26/2021   Syncope and collapse 02/26/2021   Prolonged QT interval 02/26/2021   Uncontrolled type 2 diabetes with eye complications 24/58/0998   DM (diabetes mellitus), type 2 with peripheral vascular complications ([place]) 33/82/5053   Amputation of left foot ([place]) 12/02/2014   Benign essential  HTN 12/02/2014   Depression, major, recurrent, mild ([place]) 12/02/2014   Dyslipidemia 12/02/2014   Vitamin B12 deficiency 12/02/2014   Peripheral neuropathic pain 12/02/2014   Peripheral artery disease ([place]) 12/02/2014   Phantom limb ([place]) 12/02/2014   Diabetes mellitus with nephropathy ([place]) 12/02/2014   Detached retina 12/02/2014   Type 2 diabetes mellitus with peripheral neuropathy ([place]) 12/02/2014   Tobacco use 12/02/2014   Urge incontinence 12/02/2014   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   Vitamin D deficiency 12/02/2014    Past Surgical History:  Procedure Laterality Date   ABDOMINAL HYSTERECTOMY  1992   Total   EYE SURGERY     TOE AMPUTATION Left 2011   All of her toes on the left foot   VASCULAR SURGERY      Family History  Problem Relation Age of Onset   Heart disease Father    Diabetes Mother    Diabetes Brother    Kidney disease Brother        Transplant   Heart disease Brother    Diabetes Sister    Diabetes Sister     Social History   Tobacco Use   Smoking status: Every Day    Packs/day: 0.25    Years: 40.00    Pack years: 10.00    Types: Cigarettes    Start date: 03/16/2000   Smokeless tobacco: Never  Substance Use Topics   Alcohol use: No    Alcohol/week: 0.0  standard drinks     Current Outpatient Medications:    ACCU-CHEK GUIDE test strip, DISPENSE BASED ON PATIENT AND INSURANCE PREFERENCE. USE UP TO FOUR TIMES DAILY AS DIRECTED. (FOR ICD-10 E10.9, E11.9)., Disp: 100 strip, Rfl: 5   Accu-Chek Softclix Lancets lancets, See admin instructions., Disp: , Rfl:    aspirin 81 MG tablet, Take 81 mg by mouth daily., Disp: , Rfl:    atropine 1 % ophthalmic solution, Place 1 drop into the right eye 3 (three) times daily., Disp: , Rfl:    blood glucose meter kit and supplies, Dispense based on patient and insurance preference. Use up to four times daily as directed. (FOR ICD-10 E10.9, E11.9)., Disp: 1 each, Rfl: 0   brimonidine (ALPHAGAN) 0.2 % ophthalmic  solution, Place 1 drop into both eyes 2 (two) times daily., Disp: , Rfl: 3   citalopram (CELEXA) 10 MG tablet, Take 1 tablet (10 mg total) by mouth daily., Disp: 90 tablet, Rfl: 0   clopidogrel (PLAVIX) 75 MG tablet, Take 1 tablet (75 mg total) by mouth daily., Disp: 90 tablet, Rfl: 1   Cyanocobalamin (B-12) 1000 MCG SUBL, Place 1 tablet under the tongue daily., Disp: 30 tablet, Rfl: 5   dapagliflozin propanediol (FARXIGA) 10 MG TABS tablet, Take 1 tablet (10 mg total) by mouth daily before breakfast., Disp: 90 tablet, Rfl: 0   dorzolamide-timolol (COSOPT) 22.3-6.8 MG/ML ophthalmic solution, SMARTSIG:In Eye(s), Disp: , Rfl:    furosemide (LASIX) 40 MG tablet, Take 1 tablet (40 mg total) by mouth daily., Disp: 30 tablet, Rfl: 1   JANUVIA 100 MG tablet, TAKE 1 TABLET BY MOUTH EVERY DAY, Disp: 90 tablet, Rfl: 0   KLOR-CON M20 20 MEQ tablet, TAKE 1-2 TABLETS (20-40 MEQ TOTAL) BY MOUTH DAILY., Disp: 60 tablet, Rfl: 0   losartan (COZAAR) 100 MG tablet, Take 1 tablet (100 mg total) by mouth daily., Disp: 90 tablet, Rfl: 1   metoprolol succinate (TOPROL XL) 50 MG 24 hr tablet, Take 1 tablet (50 mg total) by mouth daily. Take with or immediately following a meal., Disp: 30 tablet, Rfl: 11   Multiple Vitamin (MULTIVITAMIN) tablet, Take 1 tablet by mouth daily., Disp: , Rfl:    nitroGLYCERIN (NITROSTAT) 0.4 MG SL tablet, Place 1 tablet (0.4 mg total) under the tongue every 5 (five) minutes as needed for chest pain., Disp: 15 tablet, Rfl: 12   prednisoLONE acetate (PRED FORTE) 1 % ophthalmic suspension, Place into the right eye., Disp: , Rfl:    psyllium (METAMUCIL) 0.52 g capsule, Take 1 capsule (0.52 g total) by mouth daily., Disp: 90 capsule, Rfl: 3   rosuvastatin (CRESTOR) 10 MG tablet, Take 1 tablet (10 mg total) by mouth daily., Disp: 90 tablet, Rfl: 1   Vitamin D, Ergocalciferol, (DRISDOL) 1.25 MG (50000 UNIT) CAPS capsule, Take 1 capsule (50,000 Units total) by mouth every 7 (seven) days., Disp: 8  capsule, Rfl: 1  Allergies  Allergen Reactions   Other Itching    Nucinta   Tapentadol Hcl    Actos [Pioglitazone]     History of CHF    I personally reviewed active problem list, medication list, allergies, family history, social history, health maintenance with the patient/caregiver today.   ROS  Constitutional: Negative for fever or weight change.  Respiratory: Negative for cough and shortness of breath.   Cardiovascular: Negative for chest pain or palpitations.  Gastrointestinal: Negative for abdominal pain, no bowel changes.  Musculoskeletal: positive  for gait problem or joint swelling.  Skin: Negative for  rash.  Neurological: positive for dizziness today but no headache.  No other specific complaints in a complete review of systems (except as listed in HPI above).   Objective  Vitals:   07/30/21 1342  BP: 108/62  Pulse: 82  Resp: 16  SpO2: 98%  Weight: 123 lb (55.8 kg)  Height: $Remove'5\' 4"'COQbLhg$  (1.626 m)    Body mass index is 21.11 kg/m.  Physical Exam  Constitutional: Patient appears malnourished  No distress.  HEENT: head atraumatic, normocephalic,  neck supple Cardiovascular: Normal rate, regular rhythm and normal heart sounds.  No murmur heard. No BLE edema. Normal distal pulse right foot very thick toenails worse on fist and second right toes Pulmonary/Chest: Effort normal and breath sounds normal. No respiratory distress. Abdominal: Soft.  There is no tenderness. Psychiatric: Patient has a normal mood and affect. behavior is normal. Judgment and thought content normal.   Recent Results (from the past 2160 hour(s))  POCT HgB A1C     Status: Abnormal   Collection Time: 06/24/21 11:50 AM  Result Value Ref Range   Hemoglobin A1C 6.5 (A) 4.0 - 5.6 %   HbA1c POC (<> result, manual entry)     HbA1c, POC (prediabetic range)     HbA1c, POC (controlled diabetic range)    CBC with Differential/Platelet     Status: Abnormal   Collection Time: 06/24/21 12:31 PM  Result  Value Ref Range   WBC 3.7 (L) 3.8 - 10.8 Thousand/uL   RBC 3.79 (L) 3.80 - 5.10 Million/uL   Hemoglobin 10.5 (L) 11.7 - 15.5 g/dL   HCT 33.0 (L) 35.0 - 45.0 %   MCV 87.1 80.0 - 100.0 fL   MCH 27.7 27.0 - 33.0 pg   MCHC 31.8 (L) 32.0 - 36.0 g/dL   RDW 16.6 (H) 11.0 - 15.0 %   Platelets 223 140 - 400 Thousand/uL   MPV 12.0 7.5 - 12.5 fL   Neutro Abs 2,538 1,500 - 7,800 cells/uL   Lymphs Abs 796 (L) 850 - 3,900 cells/uL   Absolute Monocytes 329 200 - 950 cells/uL   Eosinophils Absolute 19 15 - 500 cells/uL   Basophils Absolute 19 0 - 200 cells/uL   Neutrophils Relative % 68.6 %   Total Lymphocyte 21.5 %   Monocytes Relative 8.9 %   Eosinophils Relative 0.5 %   Basophils Relative 0.5 %  COMPLETE METABOLIC PANEL WITH GFR     Status: Abnormal   Collection Time: 06/24/21 12:31 PM  Result Value Ref Range   Glucose, Bld 119 (H) 65 - 99 mg/dL    Comment: .            Fasting reference interval . For someone without known diabetes, a glucose value between 100 and 125 mg/dL is consistent with prediabetes and should be confirmed with a follow-up test. .    BUN 13 7 - 25 mg/dL   Creat 0.86 0.60 - 1.00 mg/dL   eGFR 70 > OR = 60 mL/min/1.22m2    Comment: The eGFR is based on the CKD-EPI 2021 equation. To calculate  the new eGFR from a previous Creatinine or Cystatin C result, go to https://www.kidney.org/professionals/ kdoqi/gfr%5Fcalculator    BUN/Creatinine Ratio NOT APPLICABLE 6 - 22 (calc)   Sodium 143 135 - 146 mmol/L   Potassium 3.2 (L) 3.5 - 5.3 mmol/L   Chloride 107 98 - 110 mmol/L   CO2 31 20 - 32 mmol/L   Calcium 8.9 8.6 - 10.4 mg/dL   Total Protein 6.4  6.1 - 8.1 g/dL   Albumin 3.7 3.6 - 5.1 g/dL   Globulin 2.7 1.9 - 3.7 g/dL (calc)   AG Ratio 1.4 1.0 - 2.5 (calc)   Total Bilirubin 0.4 0.2 - 1.2 mg/dL   Alkaline phosphatase (APISO) 67 37 - 153 U/L   AST 15 10 - 35 U/L   ALT 8 6 - 29 U/L  Iron, TIBC and Ferritin Panel     Status: Abnormal   Collection Time: 06/24/21  12:31 PM  Result Value Ref Range   Iron 49 45 - 160 mcg/dL   TIBC 384 250 - 450 mcg/dL (calc)   %SAT 13 (L) 16 - 45 % (calc)   Ferritin 31 16 - 288 ng/mL  Magnesium     Status: None   Collection Time: 06/24/21 12:31 PM  Result Value Ref Range   Magnesium 1.6 1.5 - 2.5 mg/dL  TSH     Status: None   Collection Time: 06/24/21 12:31 PM  Result Value Ref Range   TSH 4.49 0.40 - 4.50 mIU/L    PHQ2/9: Depression screen [facility] 2/9 07/30/2021 06/24/2021 05/10/2021 03/26/2021 03/21/2021  Decreased Interest 0 1 0 0 0  Down, Depressed, Hopeless 0 1 0 0 0  PHQ - 2 Score 0 2 0 0 0  Altered sleeping 0 0 - - -  Tired, decreased energy 0 0 - - -  Change in appetite 0 0 - - -  Feeling bad or failure about yourself  0 0 - - -  Trouble concentrating 0 0 - - -  Moving slowly or fidgety/restless 0 0 - - -  Suicidal thoughts 0 0 - - -  PHQ-9 Score 0 2 - - -  Difficult doing work/chores - - - - -  Some recent data might be hidden    phq 9 is negative   Fall Risk: Fall Risk  07/30/2021 05/10/2021 03/19/2021 02/20/2021 10/25/2020  Falls in the past year? 0 $Remov'1 1 1 'gGkJDm$ 0  Number falls in past yr: 0 $Rem'1 1 1 'MXDR$ 0  Comment - - - - -  Injury with Fall? 0 0 1 0 0  Comment - - - - -  Risk for fall due to : Impaired balance/gait History of fall(s);Impaired balance/gait;Impaired mobility Impaired balance/gait Impaired balance/gait -  Follow up Falls prevention discussed Falls evaluation completed;Education provided;Falls prevention discussed Falls prevention discussed Falls prevention discussed -      Functional Status Survey: Is the patient deaf or have difficulty hearing?: Yes Does the patient have difficulty seeing, even when wearing glasses/contacts?: Yes Does the patient have difficulty concentrating, remembering, or making decisions?: No Does the patient have difficulty walking or climbing stairs?: Yes Does the patient have difficulty dressing or bathing?: No Does the patient have difficulty doing errands alone  such as visiting a doctor's office or shopping?: No    Assessment & Plan   1. Hypertension associated with type 2 diabetes mellitus ([place])   2. Low blood pressure reading  Decrease losartan dose to 50 mg and return with all medication in a couple of weeks for medication reconciliation   3. Amputation of left foot, subsequent encounter [facility])  - Ambulatory referral to Podiatry  4. Peripheral artery disease ([place])  - Ambulatory referral to Podiatry  5. Deformity of toenail  - Ambulatory referral to Podiatry  6. Diabetes mellitus with nephropathy (HCC)  - losartan (COZAAR) 50 MG tablet; Take 1 tablet (50 mg total) by mouth daily.  Dispense: 30 tablet; Refill: 0  7. Need for Tdap vaccination  - Tdap (ADACEL) 09-24-13.5 LF-MCG/0.5 injection; Inject 0.5 mLs into the muscle once for 1 dose.  Dispense: 0.5 mL; Refill: 0  8. Need for shingles vaccine  - Zoster Vaccine Adjuvanted [facility]) injection; Inject 0.5 mLs into the muscle once for 1 dose.  Dispense: 0.5 mL; Refill: 1

## 2021-07-30 ENCOUNTER — Encounter: Payer: Self-pay | Admitting: Family Medicine

## 2021-07-30 ENCOUNTER — Ambulatory Visit (INDEPENDENT_AMBULATORY_CARE_PROVIDER_SITE_OTHER): Payer: Medicare Other | Admitting: Family Medicine

## 2021-07-30 ENCOUNTER — Other Ambulatory Visit: Payer: Self-pay | Admitting: Family Medicine

## 2021-07-30 VITALS — BP 108/62 | HR 82 | Resp 16 | Ht 64.0 in | Wt 123.0 lb

## 2021-07-30 DIAGNOSIS — S98912D Complete traumatic amputation of left foot, level unspecified, subsequent encounter: Secondary | ICD-10-CM | POA: Diagnosis not present

## 2021-07-30 DIAGNOSIS — E1121 Type 2 diabetes mellitus with diabetic nephropathy: Secondary | ICD-10-CM

## 2021-07-30 DIAGNOSIS — L608 Other nail disorders: Secondary | ICD-10-CM

## 2021-07-30 DIAGNOSIS — E440 Moderate protein-calorie malnutrition: Secondary | ICD-10-CM | POA: Diagnosis not present

## 2021-07-30 DIAGNOSIS — I739 Peripheral vascular disease, unspecified: Secondary | ICD-10-CM

## 2021-07-30 DIAGNOSIS — R197 Diarrhea, unspecified: Secondary | ICD-10-CM

## 2021-07-30 DIAGNOSIS — R031 Nonspecific low blood-pressure reading: Secondary | ICD-10-CM

## 2021-07-30 DIAGNOSIS — E119 Type 2 diabetes mellitus without complications: Secondary | ICD-10-CM

## 2021-07-30 DIAGNOSIS — Z23 Encounter for immunization: Secondary | ICD-10-CM

## 2021-07-30 DIAGNOSIS — E1159 Type 2 diabetes mellitus with other circulatory complications: Secondary | ICD-10-CM

## 2021-07-30 DIAGNOSIS — I152 Hypertension secondary to endocrine disorders: Secondary | ICD-10-CM

## 2021-07-30 DIAGNOSIS — F330 Major depressive disorder, recurrent, mild: Secondary | ICD-10-CM

## 2021-07-30 DIAGNOSIS — R634 Abnormal weight loss: Secondary | ICD-10-CM | POA: Diagnosis not present

## 2021-07-30 MED ORDER — LOSARTAN POTASSIUM 50 MG PO TABS: 50.0000 mg | ORAL_TABLET | Freq: Every day | ORAL | 0 refills | Status: DC

## 2021-07-30 MED ORDER — SHINGRIX 50 MCG/0.5ML IM SUSR: 0.5000 mL | Freq: Once | INTRAMUSCULAR | 1 refills | Status: AC

## 2021-07-30 MED ORDER — TETANUS-DIPHTH-ACELL PERTUSSIS 5-2-15.5 LF-MCG/0.5 IM SUSP: 0.5000 mL | Freq: Once | INTRAMUSCULAR | 0 refills | Status: AC

## 2021-07-30 NOTE — Patient Instructions (Signed)
Take Losartan 50mg  for now. Please bring all medication to your next visit in a couple weeks. ?

## 2021-07-31 ENCOUNTER — Telehealth: Payer: Self-pay

## 2021-07-31 NOTE — Telephone Encounter (Signed)
CALLED PATIENT NO ANSWER LEFT VOICEMAIL FOR A CALL BACK °Letter sent °

## 2021-08-12 ENCOUNTER — Telehealth: Payer: Self-pay

## 2021-08-12 NOTE — Telephone Encounter (Signed)
Scheduled for 11/28/2021

## 2021-08-12 NOTE — Telephone Encounter (Signed)
Patient returned call. Requesting call back. ?

## 2021-08-13 DIAGNOSIS — H401122 Primary open-angle glaucoma, left eye, moderate stage: Secondary | ICD-10-CM | POA: Diagnosis not present

## 2021-08-14 NOTE — Progress Notes (Signed)
Name: Lisa [name]   MRN: [id number]    DOB: 04-14-1946   Date:08/15/2021 ? ?     Progress Note ? ?Subjective ? ?Chief Complaint ? ?Follow Up ? ?HPI ? ?DMII: she brought all her medication for reconciliation today. She is currently taking losartan half of 100 mg dose ( 50 mg daily) and metoprolol 50 mg , dose was adjusted last visit due to low bp and dizziness. She states feeling slightly better but bp is still towards low end of normal, we will decrease metoprolol to half dose , so she will take 25 mg daily from now on and monitor bp at home . She is taking Januvia 100 mg and [place]. She denies polyphagia, polydipsia but has polyuria . She has noticed some vaginal itching over the past few weeks, occasionally dysuria.  ? ?Malnutrition: she had  lost another 9 lbs prior to her last visit but states she has been eating more and weight is up a little today. She is states she continues to have a change in bowel movements, but down to 3 times instead of 5  loose bowel movements per day sometimes bowel incontinence but not recently, she saw GI back in August she refuses some of the testings recommended by Dr. Zenovia Jarred she will see gastroenterologist July 6th and is willing to have colonoscopy done  ? ?Patient Active Problem List  ? Diagnosis Date Noted  ? Hypertensive emergency 04/09/2021  ? Chronic diastolic heart failure ([place]) 04/09/2021  ? Nicotine dependence 04/09/2021  ? Bruising 02/26/2021  ? Syncope and collapse 02/26/2021  ? Prolonged QT interval 02/26/2021  ? Uncontrolled type 2 diabetes with eye complications 64/33/2951  ? DM (diabetes mellitus), type 2 with peripheral vascular complications ([place]) 88/41/6606  ? Amputation of left foot ([place]) 12/02/2014  ? Benign essential HTN 12/02/2014  ? Depression, major, recurrent, mild ([place]) 12/02/2014  ? Dyslipidemia 12/02/2014  ? Vitamin B12 deficiency 12/02/2014  ? Peripheral neuropathic pain 12/02/2014  ? Peripheral artery disease ([place]) 12/02/2014  ? Phantom limb  ([place]) 12/02/2014  ? Diabetes mellitus with nephropathy ([place]) 12/02/2014  ? Detached retina 12/02/2014  ? Type 2 diabetes mellitus with peripheral neuropathy ([place]) 12/02/2014  ? Tobacco use 12/02/2014  ? Urge incontinence 12/02/2014  ? 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  ? Vitamin D deficiency 12/02/2014  ? ? ?Past Surgical History:  ?Procedure Laterality Date  ? ABDOMINAL HYSTERECTOMY  1992  ? Total  ? EYE SURGERY    ? TOE AMPUTATION Left 2011  ? All of her toes on the left foot  ? VASCULAR SURGERY    ? ? ?Family History  ?Problem Relation Age of Onset  ? Heart disease Father   ? Diabetes Mother   ? Diabetes Brother   ? Kidney disease Brother   ?     Transplant  ? Heart disease Brother   ? Diabetes Sister   ? Diabetes Sister   ? ? ?Social History  ? ?Tobacco Use  ? Smoking status: Every Day  ?  Packs/day: 0.25  ?  Years: 40.00  ?  Pack years: 10.00  ?  Types: Cigarettes  ?  Start date: 03/16/2000  ? Smokeless tobacco: Never  ?Substance Use Topics  ? Alcohol use: No  ?  Alcohol/week: 0.0 standard drinks  ? ? ? ?Current Outpatient Medications:  ?  ACCU-CHEK GUIDE test strip, DISPENSE BASED ON PATIENT AND INSURANCE PREFERENCE. USE UP TO FOUR TIMES DAILY AS DIRECTED. (FOR ICD-10 E10.9, E11.9)., Disp: 100 strip, Rfl: 5 ?  Accu-Chek Softclix Lancets lancets, See admin instructions., Disp: , Rfl:  ?  aspirin 81 MG tablet, Take 81 mg by mouth daily., Disp: , Rfl:  ?  atropine 1 % ophthalmic solution, Place 1 drop into the right eye 3 (three) times daily., Disp: , Rfl:  ?  blood glucose meter kit and supplies, Dispense based on patient and insurance preference. Use up to four times daily as directed. (FOR ICD-10 E10.9, E11.9)., Disp: 1 each, Rfl: 0 ?  brimonidine (ALPHAGAN) 0.2 % ophthalmic solution, Place 1 drop into both eyes 2 (two) times daily., Disp: , Rfl: 3 ?  citalopram (CELEXA) 10 MG tablet, TAKE 1 TABLET BY MOUTH EVERY DAY, Disp: 90 tablet, Rfl: 0 ?  clopidogrel (PLAVIX) 75 MG tablet, Take 1 tablet (75 mg total) by mouth  daily., Disp: 90 tablet, Rfl: 1 ?  Cyanocobalamin (B-12) 1000 MCG SUBL, Place 1 tablet under the tongue daily., Disp: 30 tablet, Rfl: 5 ?  dorzolamide-timolol (COSOPT) 22.3-6.8 MG/ML ophthalmic solution, SMARTSIG:In Eye(s), Disp: , Rfl:  ?  FARXIGA 10 MG TABS tablet, TAKE 1 TABLET BY MOUTH DAILY BEFORE BREAKFAST., Disp: 90 tablet, Rfl: 0 ?  furosemide (LASIX) 40 MG tablet, Take 1 tablet (40 mg total) by mouth daily., Disp: 30 tablet, Rfl: 1 ?  JANUVIA 100 MG tablet, TAKE 1 TABLET BY MOUTH EVERY DAY, Disp: 90 tablet, Rfl: 0 ?  KLOR-CON M20 20 MEQ tablet, TAKE 1-2 TABLETS (20-40 MEQ TOTAL) BY MOUTH DAILY., Disp: 60 tablet, Rfl: 0 ?  losartan (COZAAR) 50 MG tablet, Take 1 tablet (50 mg total) by mouth daily., Disp: 30 tablet, Rfl: 0 ?  metoprolol succinate (TOPROL XL) 50 MG 24 hr tablet, Take 1 tablet (50 mg total) by mouth daily. Take with or immediately following a meal., Disp: 30 tablet, Rfl: 11 ?  Multiple Vitamin (MULTIVITAMIN) tablet, Take 1 tablet by mouth daily., Disp: , Rfl:  ?  nitroGLYCERIN (NITROSTAT) 0.4 MG SL tablet, Place 1 tablet (0.4 mg total) under the tongue every 5 (five) minutes as needed for chest pain., Disp: 15 tablet, Rfl: 12 ?  prednisoLONE acetate (PRED FORTE) 1 % ophthalmic suspension, Place into the right eye., Disp: , Rfl:  ?  psyllium (METAMUCIL) 0.52 g capsule, Take 1 capsule (0.52 g total) by mouth daily., Disp: 90 capsule, Rfl: 3 ?  rosuvastatin (CRESTOR) 10 MG tablet, Take 1 tablet (10 mg total) by mouth daily., Disp: 90 tablet, Rfl: 1 ?  Vitamin D, Ergocalciferol, (DRISDOL) 1.25 MG (50000 UNIT) CAPS capsule, Take 1 capsule (50,000 Units total) by mouth every 7 (seven) days., Disp: 8 capsule, Rfl: 1 ? ?Allergies  ?Allergen Reactions  ? Other Itching  ?  Nucinta  ? Tapentadol Hcl   ? Actos [Pioglitazone]   ?  History of CHF  ? ? ?I personally reviewed active problem list, medication list, allergies, family history, social history, health maintenance with the patient/caregiver  today. ? ? ?ROS ? ?Ten systems reviewed and is negative except as mentioned in HPI  ? ?Objective ? ?Vitals:  ? 08/15/21 1109  ?BP: 112/62  ?Pulse: 72  ?Resp: 16  ?Weight: 125 lb 4 oz (56.8 kg)  ?Height: $RemoveB'5\' 4"'eLISeClf$  (1.626 m)  ? ? ?Body mass index is 21.5 kg/m?. ? ?Physical Exam ? ?Constitutional: Patient appears well-developed and malnourished  No distress.  ?HEENT: head atraumatic, normocephalic, pupils equal and reactive to light, neck supple ?Cardiovascular: Normal rate, regular rhythm and normal heart sounds.  No murmur heard. No BLE edema. ?Pulmonary/Chest: Effort normal  and breath sounds normal. No respiratory distress. ?Abdominal: Soft.  There is no tenderness. ?Psychiatric: Patient has a normal mood and affect. behavior is normal. Judgment and thought content normal.  ? ?Recent Results (from the past 2160 hour(s))  ?POCT HgB A1C     Status: Abnormal  ? Collection Time: 06/24/21 11:50 AM  ?Result Value Ref Range  ? Hemoglobin A1C 6.5 (A) 4.0 - 5.6 %  ? HbA1c POC (<> result, manual entry)    ? HbA1c, POC (prediabetic range)    ? HbA1c, POC (controlled diabetic range)    ?CBC with Differential/Platelet     Status: Abnormal  ? Collection Time: 06/24/21 12:31 PM  ?Result Value Ref Range  ? WBC 3.7 (L) 3.8 - 10.8 Thousand/uL  ? RBC 3.79 (L) 3.80 - 5.10 Million/uL  ? Hemoglobin 10.5 (L) 11.7 - 15.5 g/dL  ? HCT 33.0 (L) 35.0 - 45.0 %  ? MCV 87.1 80.0 - 100.0 fL  ? MCH 27.7 27.0 - 33.0 pg  ? MCHC 31.8 (L) 32.0 - 36.0 g/dL  ? RDW 16.6 (H) 11.0 - 15.0 %  ? Platelets 223 140 - 400 Thousand/uL  ? MPV 12.0 7.5 - 12.5 fL  ? Neutro Abs 2,538 1,500 - 7,800 cells/uL  ? Lymphs Abs 796 (L) 850 - 3,900 cells/uL  ? Absolute Monocytes 329 200 - 950 cells/uL  ? Eosinophils Absolute 19 15 - 500 cells/uL  ? Basophils Absolute 19 0 - 200 cells/uL  ? Neutrophils Relative % 68.6 %  ? Total Lymphocyte 21.5 %  ? Monocytes Relative 8.9 %  ? Eosinophils Relative 0.5 %  ? Basophils Relative 0.5 %  ?COMPLETE METABOLIC PANEL WITH GFR     Status:  Abnormal  ? Collection Time: 06/24/21 12:31 PM  ?Result Value Ref Range  ? Glucose, Bld 119 (H) 65 - 99 mg/dL  ?  Comment: . ?           Fasting reference interval ?. ?For someone without known diabetes, a glucose val

## 2021-08-15 ENCOUNTER — Ambulatory Visit (INDEPENDENT_AMBULATORY_CARE_PROVIDER_SITE_OTHER): Payer: Medicare Other | Admitting: Family Medicine

## 2021-08-15 ENCOUNTER — Encounter: Payer: Self-pay | Admitting: Family Medicine

## 2021-08-15 ENCOUNTER — Other Ambulatory Visit: Payer: Self-pay | Admitting: Family Medicine

## 2021-08-15 VITALS — BP 112/62 | HR 72 | Resp 16 | Ht 64.0 in | Wt 125.2 lb

## 2021-08-15 DIAGNOSIS — I152 Hypertension secondary to endocrine disorders: Secondary | ICD-10-CM | POA: Diagnosis not present

## 2021-08-15 DIAGNOSIS — R031 Nonspecific low blood-pressure reading: Secondary | ICD-10-CM | POA: Diagnosis not present

## 2021-08-15 DIAGNOSIS — B3731 Acute candidiasis of vulva and vagina: Secondary | ICD-10-CM | POA: Diagnosis not present

## 2021-08-15 DIAGNOSIS — E440 Moderate protein-calorie malnutrition: Secondary | ICD-10-CM | POA: Diagnosis not present

## 2021-08-15 DIAGNOSIS — E1159 Type 2 diabetes mellitus with other circulatory complications: Secondary | ICD-10-CM | POA: Diagnosis not present

## 2021-08-15 LAB — GLUCOSE, POCT (MANUAL RESULT ENTRY): POC Glucose: 102 mg/dl — AB (ref 70–99)

## 2021-08-15 MED ORDER — METOPROLOL SUCCINATE ER 25 MG PO TB24: 25.0000 mg | ORAL_TABLET | Freq: Every day | ORAL | 5 refills | Status: DC

## 2021-08-15 MED ORDER — FLUCONAZOLE 100 MG PO TABS: 100.0000 mg | ORAL_TABLET | ORAL | 1 refills | Status: DC

## 2021-08-15 NOTE — Telephone Encounter (Signed)
Requested medication (s) are due for refill today: yes ? ?Requested medication (s) are on the active medication list: yes ? ?Last refill:  04/13/21 #30 with 1 RF ? ?Future visit scheduled: 12/24/21 ? ?Notes to clinic:  This med is prescribed by provider that is not in this practice, please assess. ? ? ?  ? ?Requested Prescriptions  ?Pending Prescriptions Disp Refills  ? furosemide (LASIX) 40 MG tablet [Pharmacy Med Name: FUROSEMIDE 40 MG TABLET] 30 tablet 1  ?  Sig: TAKE 1 TABLET BY MOUTH EVERY DAY  ?  ? Cardiovascular:  Diuretics - Loop Failed - 08/15/2021  5:06 PM  ?  ?  Failed - K in normal range and within 180 days  ?  Potassium  ?Date Value Ref Range Status  ?06/24/2021 3.2 (L) 3.5 - 5.3 mmol/L Final  ?  ?  ?  ?  Passed - Ca in normal range and within 180 days  ?  Calcium  ?Date Value Ref Range Status  ?06/24/2021 8.9 8.6 - 10.4 mg/dL Final  ?  ?  ?  ?  Passed - Na in normal range and within 180 days  ?  Sodium  ?Date Value Ref Range Status  ?06/24/2021 143 135 - 146 mmol/L Final  ?09/25/2015 142 134 - 144 mmol/L Final  ?  ?  ?  ?  Passed - Cr in normal range and within 180 days  ?  Creat  ?Date Value Ref Range Status  ?06/24/2021 0.86 0.60 - 1.00 mg/dL Final  ? ?Creatinine, Urine  ?Date Value Ref Range Status  ?02/20/2021 40 20 - 275 mg/dL Final  ?  ?  ?  ?  Passed - Cl in normal range and within 180 days  ?  Chloride  ?Date Value Ref Range Status  ?06/24/2021 107 98 - 110 mmol/L Final  ?  ?  ?  ?  Passed - Mg Level in normal range and within 180 days  ?  Magnesium  ?Date Value Ref Range Status  ?06/24/2021 1.6 1.5 - 2.5 mg/dL Final  ?  ?  ?  ?  Passed - Last BP in normal range  ?  BP Readings from Last 1 Encounters:  ?08/15/21 112/62  ?  ?  ?  ?  Passed - Valid encounter within last 6 months  ?  Recent Outpatient Visits   ? ?      ? Today Hypertension associated with type 2 diabetes mellitus (HCC)  ? [facility] [place], Danna Hefty, MD  ? 2 weeks ago Hypertension associated with type 2 diabetes  mellitus (HCC)  ? [facility] [place], Danna Hefty, MD  ? 1 month ago Type 2 diabetes mellitus without complication, without long-term current use of insulin (HCC)  ? [facility] [place], Danna Hefty, MD  ? 4 months ago Hypokalemia  ? [facility] [place] M, DO  ? 5 months ago DM (diabetes mellitus), type 2 with peripheral vascular complications (HCC)  ? [facility] Alba Cory, MD  ? ?  ?  ?Future Appointments   ? ?        ? In 4 months Alba Cory, MD [facility]  ? ?  ? ?  ?  ?  ? ? ?

## 2021-08-20 ENCOUNTER — Encounter (INDEPENDENT_AMBULATORY_CARE_PROVIDER_SITE_OTHER): Payer: Self-pay | Admitting: Nurse Practitioner

## 2021-08-20 ENCOUNTER — Ambulatory Visit: Payer: 59 | Admitting: Family

## 2021-08-20 ENCOUNTER — Ambulatory Visit (INDEPENDENT_AMBULATORY_CARE_PROVIDER_SITE_OTHER): Payer: Medicare Other

## 2021-08-20 ENCOUNTER — Ambulatory Visit (INDEPENDENT_AMBULATORY_CARE_PROVIDER_SITE_OTHER): Payer: Medicare Other | Admitting: Nurse Practitioner

## 2021-08-20 ENCOUNTER — Other Ambulatory Visit: Payer: Self-pay

## 2021-08-20 VITALS — BP 191/74 | HR 60 | Resp 16 | Wt 128.8 lb

## 2021-08-20 DIAGNOSIS — I10 Essential (primary) hypertension: Secondary | ICD-10-CM | POA: Diagnosis not present

## 2021-08-20 DIAGNOSIS — E785 Hyperlipidemia, unspecified: Secondary | ICD-10-CM

## 2021-08-20 DIAGNOSIS — Z720 Tobacco use: Secondary | ICD-10-CM | POA: Diagnosis not present

## 2021-08-20 DIAGNOSIS — I739 Peripheral vascular disease, unspecified: Secondary | ICD-10-CM

## 2021-08-20 DIAGNOSIS — E1151 Type 2 diabetes mellitus with diabetic peripheral angiopathy without gangrene: Secondary | ICD-10-CM | POA: Diagnosis not present

## 2021-08-22 ENCOUNTER — Telehealth: Payer: 59

## 2021-08-23 ENCOUNTER — Other Ambulatory Visit: Payer: Self-pay | Admitting: Family Medicine

## 2021-08-23 DIAGNOSIS — I110 Hypertensive heart disease with heart failure: Secondary | ICD-10-CM

## 2021-08-23 DIAGNOSIS — Z7984 Long term (current) use of oral hypoglycemic drugs: Secondary | ICD-10-CM

## 2021-08-23 DIAGNOSIS — E1159 Type 2 diabetes mellitus with other circulatory complications: Secondary | ICD-10-CM

## 2021-08-28 DIAGNOSIS — Z89432 Acquired absence of left foot: Secondary | ICD-10-CM | POA: Diagnosis not present

## 2021-08-28 DIAGNOSIS — M79674 Pain in right toe(s): Secondary | ICD-10-CM | POA: Diagnosis not present

## 2021-08-28 DIAGNOSIS — L601 Onycholysis: Secondary | ICD-10-CM | POA: Diagnosis not present

## 2021-08-28 DIAGNOSIS — E1142 Type 2 diabetes mellitus with diabetic polyneuropathy: Secondary | ICD-10-CM | POA: Diagnosis not present

## 2021-08-28 DIAGNOSIS — L84 Corns and callosities: Secondary | ICD-10-CM | POA: Diagnosis not present

## 2021-08-28 DIAGNOSIS — M79675 Pain in left toe(s): Secondary | ICD-10-CM | POA: Diagnosis not present

## 2021-08-28 DIAGNOSIS — B351 Tinea unguium: Secondary | ICD-10-CM | POA: Diagnosis not present

## 2021-08-28 DIAGNOSIS — I739 Peripheral vascular disease, unspecified: Secondary | ICD-10-CM | POA: Diagnosis not present

## 2021-08-29 ENCOUNTER — Telehealth (INDEPENDENT_AMBULATORY_CARE_PROVIDER_SITE_OTHER): Payer: Self-pay

## 2021-08-29 NOTE — Telephone Encounter (Signed)
Spoke with the patient and gave her the recommendation from Sheppard Plumber NP and Dr. Wyn Quaker stating that due to the color, pain and ultrasound it is recommended she have a right leg angiogram. Per the patient she had a procedure with Dr. Montez Morita on 08/28/21 on her toe on the right foot. Patient stated she would like for Dr. Wyn Quaker to speak with Dr. Montez Morita and go from there do decide on what to do. ?

## 2021-09-01 ENCOUNTER — Encounter (INDEPENDENT_AMBULATORY_CARE_PROVIDER_SITE_OTHER): Payer: Self-pay | Admitting: Nurse Practitioner

## 2021-09-01 NOTE — Progress Notes (Signed)
? ?Subjective:  ? ? Patient ID: Lisa AngelBeulah [name], female    DOB: [birth year], 76 y.o.   MRN: [id number] ?Chief Complaint  ?Patient presents with  ? Follow-up  ?  Ultrasound follow up  ? ? ?The patient returns to the office for followup and review of the noninvasive studies. There has been a significant deterioration in the lower extremity symptoms.  The patient notes interval shortening of their claudication distance and development of mild rest pain symptoms. No new ulcers or wounds have occurred since the last visit. ? ?There have been no significant changes to the patient's overall health care. ? ?The patient denies amaurosis fugax or recent TIA symptoms. There are no recent neurological changes noted. ?The patient denies history of DVT, PE or superficial thrombophlebitis. ?The patient denies recent episodes of angina or shortness of breath.  ? ?ABI's Rt=1.09 and Lt=1.05 (previous ABI's Rt=0.67 and Lt=1.14) ?Duplex US of the lower extremity arterial system shows monophasic tibial artery waveforms bilaterally.  With dampened toe waveforms on the right.  The toe pressure on the right is 32 ? ? ?Review of Systems  ?Neurological:  Positive for numbness.  ?All other systems reviewed and are negative. ? ?   ?Objective:  ? Physical Exam ?Vitals reviewed.  ?HENT:  ?   Head: Normocephalic.  ?Cardiovascular:  ?   Rate and Rhythm: Normal rate.  ?   Pulses:     ?     Dorsalis pedis pulses are detected w/ Doppler on the right side.  ?Pulmonary:  ?   Effort: Pulmonary effort is normal.  ?Musculoskeletal:  ?   Left Lower Extremity: Left leg is amputated below ankle.  ?Skin: ?   General: Skin is warm and dry.  ?Neurological:  ?   Mental Status: She is alert and oriented to person, place, and time.  ?Psychiatric:     ?   Mood and Affect: Mood normal.     ?   Behavior: Behavior normal.     ?   Thought Content: Thought content normal.     ?   Judgment: Judgment normal.  ? ? ?BP (!) 191/74 (BP Location: Right Arm)   Pulse 60   Resp 16    Wt 128 lb 12.8 oz (58.4 kg)   BMI 22.11 kg/m?  ? ?Past Medical History:  ?Diagnosis Date  ? Blindness of right eye   ? CHF (congestive heart failure) (HCC)   ? Depression   ? Diabetes mellitus without complication (HCC)   ? DVT of leg (deep venous thrombosis) (HCC)   ? Glaucoma   ? Hyperlipidemia   ? Hypertension   ? Vascular disease   ? ? ?Social History  ? ?Socioeconomic History  ? Marital status: Widowed  ?  Spouse name: Not on file  ? Number of children: 0  ? Years of education: Not on file  ? Highest education level: Some college, no degree  ?Occupational History  ? Occupation: Retired  ?Tobacco Use  ? Smoking status: Every Day  ?  Packs/day: 0.25  ?  Years: 40.00  ?  Pack years: 10.00  ?  Types: Cigarettes  ?  Start date: 03/16/2000  ? Smokeless tobacco: Never  ?Vaping Use  ? Vaping Use: Never used  ?Substance and Sexual Activity  ? Alcohol use: No  ?  Alcohol/week: 0.0 standard drinks  ? Drug use: No  ? Sexual activity: Not Currently  ?Other Topics Concern  ? Not on file  ?Social History Narrative  ?  Not on file  ? ?Social Determinants of Health  ? ?Financial Resource Strain: High Risk  ? Difficulty of Paying Living Expenses: Hard  ?Food Insecurity: Food Insecurity Present  ? Worried About Programme researcher, broadcasting/film/video in the Last Year: Sometimes true  ? Ran Out of Food in the Last Year: Sometimes true  ?Transportation Needs: No Transportation Needs  ? Lack of Transportation (Medical): No  ? Lack of Transportation (Non-Medical): No  ?Physical Activity: Insufficiently Active  ? Days of Exercise per Week: 3 days  ? Minutes of Exercise per Session: 20 min  ?Stress: No Stress Concern Present  ? Feeling of Stress : Only a little  ?Social Connections: Moderately Isolated  ? Frequency of Communication with Friends and Family: More than three times a week  ? Frequency of Social Gatherings with Friends and Family: Once a week  ? Attends Religious Services: More than 4 times per year  ? Active Member of Clubs or  Organizations: No  ? Attends Banker Meetings: Never  ? Marital Status: Widowed  ?Intimate Partner Violence: Not on file  ? ? ?Past Surgical History:  ?Procedure Laterality Date  ? ABDOMINAL HYSTERECTOMY  1992  ? Total  ? EYE SURGERY    ? TOE AMPUTATION Left 2011  ? All of her toes on the left foot  ? VASCULAR SURGERY    ? ? ?Family History  ?Problem Relation Age of Onset  ? Heart disease Father   ? Diabetes Mother   ? Diabetes Brother   ? Kidney disease Brother   ?     Transplant  ? Heart disease Brother   ? Diabetes Sister   ? Diabetes Sister   ? ? ?Allergies  ?Allergen Reactions  ? Other Itching  ?  Nucinta  ? Tapentadol Hcl   ? Actos [Pioglitazone]   ?  History of CHF  ? ? ? ?  Latest Ref Rng & Units 06/24/2021  ? 12:31 PM 04/12/2021  ?  8:12 AM 04/09/2021  ?  3:36 AM  ?CBC  ?WBC 3.8 - 10.8 Thousand/uL 3.7   5.7   7.1    ?Hemoglobin 11.7 - 15.5 g/dL 68.1   9.5   15.7    ?Hematocrit 35.0 - 45.0 % 33.0   28.9   34.4    ?Platelets 140 - 400 Thousand/uL 223   246   265    ? ? ? ? ?CMP  ?   ?Component Value Date/Time  ? NA 143 06/24/2021 1231  ? NA 142 09/25/2015 1620  ? K 3.2 (L) 06/24/2021 1231  ? CL 107 06/24/2021 1231  ? CO2 31 06/24/2021 1231  ? GLUCOSE 119 (H) 06/24/2021 1231  ? BUN 13 06/24/2021 1231  ? BUN 8 09/25/2015 1620  ? CREATININE 0.86 06/24/2021 1231  ? CALCIUM 8.9 06/24/2021 1231  ? PROT 6.4 06/24/2021 1231  ? PROT 6.8 09/25/2015 1620  ? ALBUMIN 2.6 (L) 02/26/2021 0517  ? ALBUMIN 4.4 09/25/2015 1620  ? AST 15 06/24/2021 1231  ? ALT 8 06/24/2021 1231  ? ALKPHOS 51 02/26/2021 0517  ? BILITOT 0.4 06/24/2021 1231  ? BILITOT 0.4 09/25/2015 1620  ? GFRNONAA >60 04/13/2021 0417  ? GFRNONAA 64 02/17/2020 1456  ? GFRAA 74 02/17/2020 1456  ? ? ? ?VAS [place] ABI WITH/WO TBI ? ?Result Date: 08/26/2021 ? LOWER EXTREMITY DOPPLER STUDY Patient Name:  Lisa [name]  Date of Exam:   08/20/2021 Medical Rec #: [id number]  Accession #:    [id number] Date of Birth: [birth year]       Patient Gender: F Patient Age:    53 years Exam Location:  [facility] Procedure:      VAS [place] ABI WITH/WO TBI Referring Phys: --------------------------------------------------------------------------------  Indications: Peripheral artery disease. Other Factors: Worsening pain and discoloration right great toe.  Vascular Interventions: 03/27/10 & 08/05/10: Left popliteal, TP trunk & peroneal                         artery PTAs;                         Left toes amputated. Performing Technologist: Salvadore Farber RVT  Examination Guidelines: A complete evaluation includes at minimum, Doppler waveform signals and systolic blood pressure reading at the level of bilateral brachial, anterior tibial, and posterior tibial arteries, when vessel segments are accessible. Bilateral testing is considered an integral part of a complete examination. Photoelectric Plethysmograph (PPG) waveforms and toe systolic pressure readings are included as required and additional duplex testing as needed. Limited examinations for reoccurring indications may be performed as noted.  ABI Findings: +---------+------------------+-----+----------+--------+ Right    Rt Pressure (mmHg)IndexWaveform  Comment  +---------+------------------+-----+----------+--------+ Brachial 151                                       +---------+------------------+-----+----------+--------+ ATA      164               1.09 monophasic         +---------+------------------+-----+----------+--------+ PTA      110               0.73 monophasic         +---------+------------------+-----+----------+--------+ Great Toe32                0.21 Abnormal           +---------+------------------+-----+----------+--------+ +---------+------------------+-----+----------+-------+ Left     Lt Pressure (mmHg)IndexWaveform  Comment +---------+------------------+-----+----------+-------+ Brachial 150                                       +---------+------------------+-----+----------+-------+ ATA      158               1.05 monophasic        +---------+------------------+-----+----------+-------+ PTA      148               0.98 monophasic        +---------+------------------+-----+----------+-------+ Great Toe

## 2021-09-03 ENCOUNTER — Ambulatory Visit: Payer: 59 | Admitting: Family

## 2021-09-15 ENCOUNTER — Other Ambulatory Visit: Payer: Self-pay | Admitting: Family Medicine

## 2021-09-15 DIAGNOSIS — E1151 Type 2 diabetes mellitus with diabetic peripheral angiopathy without gangrene: Secondary | ICD-10-CM

## 2021-09-18 ENCOUNTER — Other Ambulatory Visit: Payer: Self-pay | Admitting: Family Medicine

## 2021-09-19 ENCOUNTER — Telehealth (INDEPENDENT_AMBULATORY_CARE_PROVIDER_SITE_OTHER): Payer: Self-pay

## 2021-09-19 NOTE — Telephone Encounter (Signed)
I attempted to contact the patient to schedule her with Dr. Wyn Quaker for ar right leg angio. A message was left for a return call. ?

## 2021-10-02 DIAGNOSIS — B351 Tinea unguium: Secondary | ICD-10-CM | POA: Diagnosis not present

## 2021-10-02 DIAGNOSIS — E1142 Type 2 diabetes mellitus with diabetic polyneuropathy: Secondary | ICD-10-CM | POA: Diagnosis not present

## 2021-10-30 ENCOUNTER — Other Ambulatory Visit: Payer: Self-pay | Admitting: Family Medicine

## 2021-10-30 DIAGNOSIS — E1151 Type 2 diabetes mellitus with diabetic peripheral angiopathy without gangrene: Secondary | ICD-10-CM

## 2021-10-30 MED ORDER — SITAGLIPTIN PHOSPHATE 100 MG PO TABS: 100.0000 mg | ORAL_TABLET | Freq: Every day | ORAL | 0 refills | Status: DC

## 2021-10-30 NOTE — Telephone Encounter (Signed)
Medication Refill - Medication: JANUVIA 100 MG tablet   Has the patient contacted their pharmacy? Yes.   (Agent: If no, request that the patient contact the pharmacy for the refill. If patient does not wish to contact the pharmacy document the reason why and proceed with request.) (Agent: If yes, when and what did the pharmacy advise?)  Preferred Pharmacy (with phone number or street name):  [id number] [place], [place] - [address]  [address] [place] [address]  Phone: [phone] Fax: [phone]   Has the patient been seen for an appointment in the last year OR does the patient have an upcoming appointment? Yes.    Agent: Please be advised that RX refills may take up to 3 business days. We ask that you follow-up with your pharmacy.

## 2021-10-30 NOTE — Telephone Encounter (Signed)
Requested Prescriptions  Pending Prescriptions Disp Refills  . sitaGLIPtin (JANUVIA) 100 MG tablet 90 tablet 0    Sig: Take 1 tablet (100 mg total) by mouth daily.     Endocrinology:  Diabetes - DPP-4 Inhibitors Passed - 10/30/2021 11:57 AM      Passed - HBA1C is between 0 and 7.9 and within 180 days    Hemoglobin A1C  Date Value Ref Range Status  06/24/2021 6.5 (A) 4.0 - 5.6 % Final   HbA1c, POC (prediabetic range)  Date Value Ref Range Status  08/16/2019 6.5 (A) 5.7 - 6.4 % Final   Hgb A1c MFr Bld  Date Value Ref Range Status  02/17/2020 8.5 (H) <5.7 % of total Hgb Final    Comment:    For someone without known diabetes, a hemoglobin A1c value of 6.5% or greater indicates that they may have  diabetes and this should be confirmed with a follow-up  test. . For someone with known diabetes, a value <7% indicates  that their diabetes is well controlled and a value  greater than or equal to 7% indicates suboptimal  control. A1c targets should be individualized based on  duration of diabetes, age, comorbid conditions, and  other considerations. . Currently, no consensus exists regarding use of hemoglobin A1c for diagnosis of diabetes for children. .          Passed - Cr in normal range and within 360 days    Creat  Date Value Ref Range Status  06/24/2021 0.86 0.60 - 1.00 mg/dL Final   Creatinine, Urine  Date Value Ref Range Status  02/20/2021 40 20 - 275 mg/dL Final         Passed - Valid encounter within last 6 months    Recent Outpatient Visits          2 months ago Hypertension associated with type 2 diabetes mellitus [facility])   [facility] [place], Danna Hefty, MD   3 months ago Hypertension associated with type 2 diabetes mellitus [facility])   [facility], Danna Hefty, MD   4 months ago Type 2 diabetes mellitus without complication, without long-term current use of insulin [facility])   [facility] Alba Cory, MD    7 months ago Hypokalemia   [facility] Dense M, DO   8 months ago DM (diabetes mellitus), type 2 with peripheral vascular complications [facility])   [facility] Alba Cory, MD      Future Appointments            In 1 month Carlynn Purl, Danna Hefty, MD [facility], [facility]

## 2021-11-19 DIAGNOSIS — H2512 Age-related nuclear cataract, left eye: Secondary | ICD-10-CM | POA: Diagnosis not present

## 2021-11-28 ENCOUNTER — Other Ambulatory Visit: Payer: Self-pay

## 2021-11-28 ENCOUNTER — Ambulatory Visit: Payer: Medicare Other | Admitting: Gastroenterology

## 2021-12-18 ENCOUNTER — Other Ambulatory Visit: Payer: Self-pay | Admitting: Family Medicine

## 2021-12-18 DIAGNOSIS — E119 Type 2 diabetes mellitus without complications: Secondary | ICD-10-CM

## 2021-12-18 DIAGNOSIS — E559 Vitamin D deficiency, unspecified: Secondary | ICD-10-CM

## 2021-12-18 DIAGNOSIS — F330 Major depressive disorder, recurrent, mild: Secondary | ICD-10-CM

## 2021-12-23 NOTE — Progress Notes (Deleted)
Name: Lisa [name]   MRN: [id number]    DOB: May 04, 1946   Date:12/23/2021       Progress Note  Subjective  Chief Complaint  Follow Up  HPI  DMII: she brought all her medication for reconciliation today. She is currently taking losartan half of 100 mg dose ( 50 mg daily) and metoprolol 50 mg , dose was adjusted last visit due to low bp and dizziness. She states feeling slightly better but bp is still towards low end of normal, we will decrease metoprolol to half dose , so she will take 25 mg daily from now on and monitor bp at home . She is taking Januvia 100 mg and [place]. She denies polyphagia, polydipsia but has polyuria . She has noticed some vaginal itching over the past few weeks, occasionally dysuria.   Malnutrition: she had  lost another 9 lbs prior to her last visit but states she has been eating more and weight is up a little today. She is states she continues to have a change in bowel movements, but down to 3 times instead of 5  loose bowel movements per day sometimes bowel incontinence but not recently, she saw GI back in August she refuses some of the testings recommended by Dr. Zenovia Jarred she will see gastroenterologist July 6th and is willing to have colonoscopy done   Patient Active Problem List   Diagnosis Date Noted   Hypertensive emergency 04/09/2021   Chronic diastolic heart failure ([place]) 04/09/2021   Nicotine dependence 04/09/2021   Bruising 02/26/2021   Syncope and collapse 02/26/2021   Prolonged QT interval 02/26/2021   Uncontrolled type 2 diabetes with eye complications 64/40/3474   DM (diabetes mellitus), type 2 with peripheral vascular complications ([place]) 25/95/6387   Amputation of left foot ([place]) 12/02/2014   Benign essential HTN 12/02/2014   Depression, major, recurrent, mild ([place]) 12/02/2014   Dyslipidemia 12/02/2014   Vitamin B12 deficiency 12/02/2014   Peripheral neuropathic pain 12/02/2014   Peripheral artery disease ([place]) 12/02/2014   Phantom limb  ([place]) 12/02/2014   Diabetes mellitus with nephropathy ([place]) 12/02/2014   Detached retina 12/02/2014   Type 2 diabetes mellitus with peripheral neuropathy ([place]) 12/02/2014   Tobacco use 12/02/2014   Urge incontinence 12/02/2014   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   Vitamin D deficiency 12/02/2014    Past Surgical History:  Procedure Laterality Date   ABDOMINAL HYSTERECTOMY  1992   Total   EYE SURGERY     TOE AMPUTATION Left 2011   All of her toes on the left foot   VASCULAR SURGERY      Family History  Problem Relation Age of Onset   Heart disease Father    Diabetes Mother    Diabetes Brother    Kidney disease Brother        Transplant   Heart disease Brother    Diabetes Sister    Diabetes Sister     Social History   Tobacco Use   Smoking status: Every Day    Packs/day: 0.25    Years: 40.00    Total pack years: 10.00    Types: Cigarettes    Start date: 03/16/2000   Smokeless tobacco: Never  Substance Use Topics   Alcohol use: No    Alcohol/week: 0.0 standard drinks of alcohol     Current Outpatient Medications:    ACCU-CHEK GUIDE test strip, DISPENSE BASED ON PATIENT AND INSURANCE PREFERENCE. USE UP TO FOUR TIMES DAILY AS DIRECTED. (FOR ICD-10 E10.9, E11.9)., Disp: 100  strip, Rfl: 5   Accu-Chek Softclix Lancets lancets, See admin instructions., Disp: , Rfl:    aspirin 81 MG tablet, Take 81 mg by mouth daily., Disp: , Rfl:    atropine 1 % ophthalmic solution, Place 1 drop into the right eye 3 (three) times daily., Disp: , Rfl:    blood glucose meter kit and supplies KIT, DISPENSE BASED ON PATIENT AND INSURANCE PREFERENCE. USE UP TO FOUR TIMES DAILY AS DIRECTED. (FOR ICD-10 E10.9, E11.9)., Disp: , Rfl:    blood glucose meter kit and supplies, Dispense based on patient and insurance preference. Use up to four times daily as directed. (FOR ICD-10 E10.9, E11.9)., Disp: 1 each, Rfl: 0   brimonidine (ALPHAGAN) 0.2 % ophthalmic solution, Place 1 drop into both eyes 2 (two)  times daily., Disp: , Rfl: 3   citalopram (CELEXA) 10 MG tablet, TAKE 1 TABLET BY MOUTH EVERY DAY, Disp: 90 tablet, Rfl: 0   clopidogrel (PLAVIX) 75 MG tablet, Take 1 tablet (75 mg total) by mouth daily., Disp: 90 tablet, Rfl: 1   Cyanocobalamin (B-12) 1000 MCG SUBL, Place 1 tablet under the tongue daily., Disp: 30 tablet, Rfl: 5   dorzolamide-timolol (COSOPT) 22.3-6.8 MG/ML ophthalmic solution, SMARTSIG:In Eye(s), Disp: , Rfl:    FARXIGA 10 MG TABS tablet, TAKE 1 TABLET BY MOUTH EVERY DAY BEFORE BREAKFAST, Disp: 90 tablet, Rfl: 0   fluconazole (DIFLUCAN) 100 MG tablet, Take 1 tablet (100 mg total) by mouth every other day. Hold citalopram when taking this medication, Disp: 3 tablet, Rfl: 1   furosemide (LASIX) 40 MG tablet, Take 1 tablet (40 mg total) by mouth daily., Disp: 30 tablet, Rfl: 1   KLOR-CON M20 20 MEQ tablet, TAKE 1-2 TABLETS (20-40 MEQ TOTAL) BY MOUTH DAILY., Disp: 180 tablet, Rfl: 0   losartan (COZAAR) 100 MG tablet, Take 100 mg by mouth daily., Disp: , Rfl:    metoprolol succinate (TOPROL XL) 25 MG 24 hr tablet, Take 1 tablet (25 mg total) by mouth daily. Take with or immediately following a meal., Disp: 30 tablet, Rfl: 5   Multiple Vitamin (MULTIVITAMIN) tablet, Take 1 tablet by mouth daily., Disp: , Rfl:    nitroGLYCERIN (NITROSTAT) 0.4 MG SL tablet, Place 1 tablet (0.4 mg total) under the tongue every 5 (five) minutes as needed for chest pain., Disp: 15 tablet, Rfl: 12   pioglitazone (ACTOS) 15 MG tablet, Take 1 tablet by mouth daily., Disp: , Rfl:    prednisoLONE acetate (PRED FORTE) 1 % ophthalmic suspension, Place into the right eye., Disp: , Rfl:    psyllium (METAMUCIL) 0.52 g capsule, Take 1 capsule (0.52 g total) by mouth daily., Disp: 90 capsule, Rfl: 3   rosuvastatin (CRESTOR) 10 MG tablet, Take 1 tablet (10 mg total) by mouth daily., Disp: 90 tablet, Rfl: 1   sitaGLIPtin (JANUVIA) 100 MG tablet, Take 1 tablet (100 mg total) by mouth daily., Disp: 90 tablet, Rfl: 0    Vitamin D, Ergocalciferol, (DRISDOL) 1.25 MG (50000 UNIT) CAPS capsule, TAKE 1 CAPSULE (50,000 UNITS TOTAL) BY MOUTH EVERY 7 (SEVEN) DAYS, Disp: 8 capsule, Rfl: 1  Allergies  Allergen Reactions   Other Itching    Nucinta   Tapentadol Hcl    Actos [Pioglitazone]     History of CHF    I personally reviewed active problem list, medication list, allergies, family history, social history, health maintenance with the patient/caregiver today.   ROS  ***  Objective  There were no vitals filed for this visit.  There  is no height or weight on file to calculate BMI.  Physical Exam ***  No results found for this or any previous visit (from the past 2160 hour(s)).  Diabetic Foot Exam: Diabetic Foot Exam - Simple   No data filed    ***  PHQ2/9:    08/15/2021   11:08 AM 07/30/2021    1:48 PM 06/24/2021   11:36 AM 05/10/2021   12:50 PM 03/26/2021   10:31 AM  Depression screen PHQ 2/9  Decreased Interest 0 0 1 0 0  Down, Depressed, Hopeless 0 0 1 0 0  PHQ - 2 Score 0 0 2 0 0  Altered sleeping 0 0 0    Tired, decreased energy 0 0 0    Change in appetite 0 0 0    Feeling bad or failure about yourself  0 0 0    Trouble concentrating 0 0 0    Moving slowly or fidgety/restless 0 0 0    Suicidal thoughts 0 0 0    PHQ-9 Score 0 0 2      phq 9 is {gen pos [id number]}   Fall Risk:    08/15/2021   11:08 AM 07/30/2021    1:41 PM 05/10/2021   12:49 PM 03/19/2021   11:19 AM 02/20/2021    2:02 PM  Fall Risk   Falls in the past year? 0 0 $R'1 1 1  'rl$ Number falls in past yr: 0 0 $R'1 1 1  'FV$ Injury with Fall? 0 0 0 1 0  Risk for fall due to : Impaired balance/gait Impaired balance/gait History of fall(s);Impaired balance/gait;Impaired mobility Impaired balance/gait Impaired balance/gait  Follow up Falls prevention discussed Falls prevention discussed Falls evaluation completed;Education provided;Falls prevention discussed Falls prevention discussed Falls prevention discussed      Functional  Status Survey:      Assessment & Plan  *** There are no diagnoses linked to this encounter.

## 2021-12-24 ENCOUNTER — Ambulatory Visit: Payer: Medicare Other | Admitting: Family Medicine

## 2021-12-24 DIAGNOSIS — I152 Hypertension secondary to endocrine disorders: Secondary | ICD-10-CM

## 2022-01-06 LAB — HM DIABETES FOOT EXAM

## 2022-01-11 ENCOUNTER — Other Ambulatory Visit: Payer: Self-pay | Admitting: Family Medicine

## 2022-01-11 DIAGNOSIS — E1151 Type 2 diabetes mellitus with diabetic peripheral angiopathy without gangrene: Secondary | ICD-10-CM

## 2022-02-18 NOTE — Progress Notes (Unsigned)
Name: Lisa [name]   MRN: [id number]    DOB: [birth year]   Date:02/19/2022       Progress Note  Subjective  Chief Complaint  Follow Up  HPI  DMII: Glucose at home around 120  She is taking Januvia 100 mg, Farxiga and Actos, today when she arrived she had a cup of coffee and two  peppermints before she came in ,  and glucose was down to 45, we gave her a snack. A1C is at goal at 6.5 % . She denies polyphagia, polydipsia but has polyuria . She has PAD and HTN. She is on ARB, sees podiatrist and vascular surgeo. We will stop actos, explained she cannot skip meals.  Malnutrition: she was seen by Dr. Bonna Gains int he past for weight loss ,diarrhea and incontinence but she did not go back for further evaluation, weight is stable but bowel movements still frequent, no longer wanting to leave the house due to fecal smearing and diarrhea. Explained importance of going back to GI   Depression: she is feeling down again, she continues to feel frustrated about her health, having worsening symptoms of diarrhea, also has urinary frequency and urgency, not leaving her house. She is getting more isolated because she does not feel comfortable leaving her house. She is taking citalopram.    HTN/CHF: she was admitted in Nov 2022 , she is on Losartan and Metoprolol, she is off Norvasc, taking furosemide in am's She denies side effects of medications. No chest pain, SOB is stable with activity.  She takes furosemide prn. We will stop pioglitazone today    Nocturnal enuresis: symptoms are getting worse, we will refer her to Urologist now    PAD: she was recently seen by Dr. Lucky Cowboy, 01/2021 and ABI was 0.67  on the left  and 1.1 on right.  History of left distal foot amputation, she has phantom pain , she has pain on both legs from PAD. She also has a history of neuropathy and states staggers when walking. She is up to date with podiatrist , she is due for follow up with vascular surgeon    Low vitamin D and B12: taking  supplementation , we will recheck labs     Patient Active Problem List   Diagnosis Date Noted   Unilateral amputation of left foot ([place]) 02/19/2022   Hypertension associated with type 2 diabetes mellitus ([place]) 02/19/2022   Hypertensive emergency 04/09/2021   Chronic diastolic heart failure ([place]) 04/09/2021   Nicotine dependence 04/09/2021   Bruising 02/26/2021   Syncope and collapse 02/26/2021   Prolonged QT interval 02/26/2021   Uncontrolled type 2 diabetes with eye complications 24/01/7352   DM (diabetes mellitus), type 2 with peripheral vascular complications ([place]) 29/92/4268   Amputation of left foot ([place]) 12/02/2014   Benign essential HTN 12/02/2014   Depression, major, recurrent, mild ([place]) 12/02/2014   Dyslipidemia 12/02/2014   Vitamin B12 deficiency 12/02/2014   Peripheral neuropathic pain 12/02/2014   Peripheral artery disease ([place]) 12/02/2014   Phantom limb ([place]) 12/02/2014   Diabetes mellitus with nephropathy ([place]) 12/02/2014   Detached retina 12/02/2014   Type 2 diabetes mellitus with peripheral neuropathy ([place]) 12/02/2014   Tobacco use 12/02/2014   Urge incontinence 12/02/2014   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   Vitamin D deficiency 12/02/2014    Past Surgical History:  Procedure Laterality Date   ABDOMINAL HYSTERECTOMY  1992   Total   EYE SURGERY     TOE AMPUTATION Left 2011   All of  her toes on the left foot   VASCULAR SURGERY      Family History  Problem Relation Age of Onset   Heart disease Father    Diabetes Mother    Diabetes Brother    Kidney disease Brother        Transplant   Heart disease Brother    Diabetes Sister    Diabetes Sister     Social History   Tobacco Use   Smoking status: Every Day    Packs/day: 0.25    Years: 40.00    Total pack years: 10.00    Types: Cigarettes    Start date: 03/16/2000   Smokeless tobacco: Never  Substance Use Topics   Alcohol use: No    Alcohol/week: 0.0 standard drinks of alcohol     Current  Outpatient Medications:    ACCU-CHEK GUIDE test strip, DISPENSE BASED ON PATIENT AND INSURANCE PREFERENCE. USE UP TO FOUR TIMES DAILY AS DIRECTED. (FOR ICD-10 E10.9, E11.9)., Disp: 100 strip, Rfl: 5   Accu-Chek Softclix Lancets lancets, See admin instructions., Disp: , Rfl:    aspirin 81 MG tablet, Take 81 mg by mouth daily., Disp: , Rfl:    atropine 1 % ophthalmic solution, Place 1 drop into the right eye 3 (three) times daily., Disp: , Rfl:    blood glucose meter kit and supplies KIT, DISPENSE BASED ON PATIENT AND INSURANCE PREFERENCE. USE UP TO FOUR TIMES DAILY AS DIRECTED. (FOR ICD-10 E10.9, E11.9)., Disp: , Rfl:    blood glucose meter kit and supplies, Dispense based on patient and insurance preference. Use up to four times daily as directed. (FOR ICD-10 E10.9, E11.9)., Disp: 1 each, Rfl: 0   brimonidine (ALPHAGAN) 0.2 % ophthalmic solution, Place 1 drop into both eyes 2 (two) times daily., Disp: , Rfl: 3   Cyanocobalamin (B-12) 1000 MCG SUBL, Place 1 tablet under the tongue daily., Disp: 30 tablet, Rfl: 5   dorzolamide-timolol (COSOPT) 22.3-6.8 MG/ML ophthalmic solution, SMARTSIG:In Eye(s), Disp: , Rfl:    fluconazole (DIFLUCAN) 100 MG tablet, Take 1 tablet (100 mg total) by mouth every other day. Hold citalopram when taking this medication, Disp: 3 tablet, Rfl: 1   furosemide (LASIX) 40 MG tablet, Take 1 tablet (40 mg total) by mouth daily., Disp: 30 tablet, Rfl: 1   KLOR-CON M20 20 MEQ tablet, TAKE 1-2 TABLETS (20-40 MEQ TOTAL) BY MOUTH DAILY., Disp: 180 tablet, Rfl: 0   Multiple Vitamin (MULTIVITAMIN) tablet, Take 1 tablet by mouth daily., Disp: , Rfl:    nitroGLYCERIN (NITROSTAT) 0.4 MG SL tablet, Place 1 tablet (0.4 mg total) under the tongue every 5 (five) minutes as needed for chest pain., Disp: 15 tablet, Rfl: 12   prednisoLONE acetate (PRED FORTE) 1 % ophthalmic suspension, Place into the right eye., Disp: , Rfl:    psyllium (METAMUCIL) 0.52 g capsule, Take 1 capsule (0.52 g total) by  mouth daily., Disp: 90 capsule, Rfl: 3   Vitamin D, Ergocalciferol, (DRISDOL) 1.25 MG (50000 UNIT) CAPS capsule, TAKE 1 CAPSULE (50,000 UNITS TOTAL) BY MOUTH EVERY 7 (SEVEN) DAYS, Disp: 8 capsule, Rfl: 1   citalopram (CELEXA) 10 MG tablet, Take 1 tablet (10 mg total) by mouth daily., Disp: 90 tablet, Rfl: 1   clopidogrel (PLAVIX) 75 MG tablet, Take 1 tablet (75 mg total) by mouth daily., Disp: 90 tablet, Rfl: 1   dapagliflozin propanediol (FARXIGA) 10 MG TABS tablet, TAKE 1 TABLET BY MOUTH EVERY DAY BEFORE BREAKFAST, Disp: 90 tablet, Rfl: 1   losartan (COZAAR)  100 MG tablet, Take 0.5 tablets (50 mg total) by mouth daily., Disp: 90 tablet, Rfl: 1   metoprolol succinate (TOPROL XL) 25 MG 24 hr tablet, Take 1 tablet (25 mg total) by mouth daily. Take with or immediately following a meal., Disp: 90 tablet, Rfl: 1   rosuvastatin (CRESTOR) 10 MG tablet, Take 1 tablet (10 mg total) by mouth daily., Disp: 90 tablet, Rfl: 1   sitaGLIPtin (JANUVIA) 100 MG tablet, Take 1 tablet (100 mg total) by mouth daily., Disp: 90 tablet, Rfl: 1  Allergies  Allergen Reactions   Other Itching    Nucinta   Tapentadol Hcl    Actos [Pioglitazone]     History of CHF    I personally reviewed active problem list, medication list, allergies, family history, social history, health maintenance with the patient/caregiver today.   ROS  Constitutional: Negative for fever or weight change.  Respiratory: Negative for cough and shortness of breath.   Cardiovascular: Negative for chest pain or palpitations.  Gastrointestinal: Negative for abdominal pain, no bowel changes.  Musculoskeletal: positive  for gait problem but no joint swelling.  Skin: Negative for rash.  Neurological: Negative for dizziness or headache.  No other specific complaints in a complete review of systems (except as listed in HPI above).   Objective  Vitals:   02/19/22 1024  BP: 138/72  Resp: 14  Temp: 97.7 F (36.5 C)  TempSrc: Oral  Weight:  130 lb 1.6 oz (59 kg)    Body mass index is 22.33 kg/m.  Physical Exam  Constitutional: Patient appears well-developed and malnourished No distress.  HEENT: head atraumatic, normocephalic, pupils equal and reactive to light,, neck supple Cardiovascular: Normal rate, regular rhythm and normal heart sounds.  No murmur heard. No BLE edema. Pulmonary/Chest: Effort normal and breath sounds normal. No respiratory distress. Abdominal: Soft.  There is no tenderness. Psychiatric: Patient has a normal mood and affect. behavior is normal. Judgment and thought content normal.    PHQ2/9:    02/19/2022   10:30 AM 08/15/2021   11:08 AM 07/30/2021    1:48 PM 06/24/2021   11:36 AM 05/10/2021   12:50 PM  Depression screen PHQ 2/9  Decreased Interest 0 0 0 1 0  Down, Depressed, Hopeless 2 0 0 1 0  PHQ - 2 Score 2 0 0 2 0  Altered sleeping 0 0 0 0   Tired, decreased energy 1 0 0 0   Change in appetite 0 0 0 0   Feeling bad or failure about yourself  0 0 0 0   Trouble concentrating 1 0 0 0   Moving slowly or fidgety/restless 0 0 0 0   Suicidal thoughts 0 0 0 0   PHQ-9 Score 4 0 0 2     phq 9 is negative   Fall Risk:    02/19/2022   10:30 AM 08/15/2021   11:08 AM 07/30/2021    1:41 PM 05/10/2021   12:49 PM 03/19/2021   11:19 AM  Fall Risk   Falls in the past year? 1 0 0 1 1  Number falls in past yr: 0 0 0 1 1  Injury with Fall?  0 0 0 1  Risk for fall due to : Impaired vision;History of fall(s) Impaired balance/gait Impaired balance/gait History of fall(s);Impaired balance/gait;Impaired mobility Impaired balance/gait  Follow up Education provided;Falls evaluation completed;Falls prevention discussed Falls prevention discussed Falls prevention discussed Falls evaluation completed;Education provided;Falls prevention discussed Falls prevention discussed     Assessment &  Plan  1. Hypertension associated with type 2 diabetes mellitus (HCC)  - POCT HgB A1C - Urine Microalbumin w/creat.  ratio - metoprolol succinate (TOPROL XL) 25 MG 24 hr tablet; Take 1 tablet (25 mg total) by mouth daily. Take with or immediately following a meal.  Dispense: 90 tablet; Refill: 1 - CBC with Differential/Platelet - COMPLETE METABOLIC PANEL WITH GFR - losartan (COZAAR) 100 MG tablet; Take 0.5 tablets (50 mg total) by mouth daily.  Dispense: 90 tablet; Refill: 1  2. DM (diabetes mellitus), type 2 with peripheral vascular complications (HCC)  - sitaGLIPtin (JANUVIA) 100 MG tablet; Take 1 tablet (100 mg total) by mouth daily.  Dispense: 90 tablet; Refill: 1  3. Need for immunization against influenza  - Flu Vaccine QUAD High Dose(Fluad)  4. Amputation of left foot, subsequent encounter ([place])   5. Hypoglycemia due to type 2 diabetes mellitus (HCC)  - POCT Glucose (CBG)  6. Chronic diastolic heart failure ([place])   7. Moderate protein-calorie malnutrition (HCC)  - TSH  8. Depression, major, recurrent, mild (HCC)  - citalopram (CELEXA) 10 MG tablet; Take 1 tablet (10 mg total) by mouth daily.  Dispense: 90 tablet; Refill: 1  9. Peripheral artery disease (HCC)  - clopidogrel (PLAVIX) 75 MG tablet; Take 1 tablet (75 mg total) by mouth daily.  Dispense: 90 tablet; Refill: 1 - rosuvastatin (CRESTOR) 10 MG tablet; Take 1 tablet (10 mg total) by mouth daily.  Dispense: 90 tablet; Refill: 1  10. Dyslipidemia  - rosuvastatin (CRESTOR) 10 MG tablet; Take 1 tablet (10 mg total) by mouth daily.  Dispense: 90 tablet; Refill: 1 - Lipid panel  11. Benign essential HTN  At goal   12. Vitamin D deficiency  - VITAMIN D 25 Hydroxy (Vit-D Deficiency, Fractures)  13. Vitamin B12 deficiency  - Vitamin B12  14. Nocturnal enuresis  - CULTURE, URINE COMPREHENSIVE  15. Urge incontinence of urine  - Ambulatory referral to Urology - CULTURE, URINE COMPREHENSIVE  16. Fecal smearing  - Ambulatory referral to Gastroenterology  17. Elevated TSH  - TSH

## 2022-02-19 ENCOUNTER — Ambulatory Visit (INDEPENDENT_AMBULATORY_CARE_PROVIDER_SITE_OTHER): Payer: Medicare Other | Admitting: Family Medicine

## 2022-02-19 ENCOUNTER — Encounter: Payer: Self-pay | Admitting: Family Medicine

## 2022-02-19 VITALS — BP 138/72 | Temp 97.7°F | Resp 14 | Wt 130.1 lb

## 2022-02-19 DIAGNOSIS — F330 Major depressive disorder, recurrent, mild: Secondary | ICD-10-CM

## 2022-02-19 DIAGNOSIS — I739 Peripheral vascular disease, unspecified: Secondary | ICD-10-CM

## 2022-02-19 DIAGNOSIS — N3941 Urge incontinence: Secondary | ICD-10-CM | POA: Diagnosis not present

## 2022-02-19 DIAGNOSIS — E1159 Type 2 diabetes mellitus with other circulatory complications: Secondary | ICD-10-CM | POA: Diagnosis not present

## 2022-02-19 DIAGNOSIS — E785 Hyperlipidemia, unspecified: Secondary | ICD-10-CM

## 2022-02-19 DIAGNOSIS — S98912D Complete traumatic amputation of left foot, level unspecified, subsequent encounter: Secondary | ICD-10-CM

## 2022-02-19 DIAGNOSIS — I152 Hypertension secondary to endocrine disorders: Secondary | ICD-10-CM | POA: Insufficient documentation

## 2022-02-19 DIAGNOSIS — E1151 Type 2 diabetes mellitus with diabetic peripheral angiopathy without gangrene: Secondary | ICD-10-CM

## 2022-02-19 DIAGNOSIS — N3944 Nocturnal enuresis: Secondary | ICD-10-CM

## 2022-02-19 DIAGNOSIS — R151 Fecal smearing: Secondary | ICD-10-CM

## 2022-02-19 DIAGNOSIS — E11649 Type 2 diabetes mellitus with hypoglycemia without coma: Secondary | ICD-10-CM

## 2022-02-19 DIAGNOSIS — I10 Essential (primary) hypertension: Secondary | ICD-10-CM | POA: Diagnosis not present

## 2022-02-19 DIAGNOSIS — I5032 Chronic diastolic (congestive) heart failure: Secondary | ICD-10-CM | POA: Diagnosis not present

## 2022-02-19 DIAGNOSIS — Z23 Encounter for immunization: Secondary | ICD-10-CM | POA: Diagnosis not present

## 2022-02-19 DIAGNOSIS — S98912A Complete traumatic amputation of left foot, level unspecified, initial encounter: Secondary | ICD-10-CM | POA: Insufficient documentation

## 2022-02-19 DIAGNOSIS — R7989 Other specified abnormal findings of blood chemistry: Secondary | ICD-10-CM | POA: Diagnosis not present

## 2022-02-19 DIAGNOSIS — E559 Vitamin D deficiency, unspecified: Secondary | ICD-10-CM | POA: Diagnosis not present

## 2022-02-19 DIAGNOSIS — E538 Deficiency of other specified B group vitamins: Secondary | ICD-10-CM

## 2022-02-19 DIAGNOSIS — E440 Moderate protein-calorie malnutrition: Secondary | ICD-10-CM

## 2022-02-19 LAB — POCT GLYCOSYLATED HEMOGLOBIN (HGB A1C): Hemoglobin A1C: 6.3 % — AB (ref 4.0–5.6)

## 2022-02-19 LAB — GLUCOSE, POCT (MANUAL RESULT ENTRY): POC Glucose: 45 mg/dl — AB (ref 70–99)

## 2022-02-19 MED ORDER — DAPAGLIFLOZIN PROPANEDIOL 10 MG PO TABS: ORAL_TABLET | ORAL | 1 refills | Status: DC

## 2022-02-19 MED ORDER — LOSARTAN POTASSIUM 100 MG PO TABS: 50.0000 mg | ORAL_TABLET | Freq: Every day | ORAL | 1 refills | Status: DC

## 2022-02-19 MED ORDER — CITALOPRAM HYDROBROMIDE 10 MG PO TABS: 10.0000 mg | ORAL_TABLET | Freq: Every day | ORAL | 1 refills | Status: DC

## 2022-02-19 MED ORDER — ROSUVASTATIN CALCIUM 10 MG PO TABS: 10.0000 mg | ORAL_TABLET | Freq: Every day | ORAL | 1 refills | Status: DC

## 2022-02-19 MED ORDER — CLOPIDOGREL BISULFATE 75 MG PO TABS: 75.0000 mg | ORAL_TABLET | Freq: Every day | ORAL | 1 refills | Status: DC

## 2022-02-19 MED ORDER — SITAGLIPTIN PHOSPHATE 100 MG PO TABS: 100.0000 mg | ORAL_TABLET | Freq: Every day | ORAL | 1 refills | Status: DC

## 2022-02-19 MED ORDER — METOPROLOL SUCCINATE ER 25 MG PO TB24: 25.0000 mg | ORAL_TABLET | Freq: Every day | ORAL | 1 refills | Status: DC

## 2022-02-21 ENCOUNTER — Other Ambulatory Visit: Payer: Self-pay | Admitting: Family Medicine

## 2022-02-21 ENCOUNTER — Other Ambulatory Visit: Payer: Self-pay

## 2022-02-21 DIAGNOSIS — E876 Hypokalemia: Secondary | ICD-10-CM

## 2022-02-22 LAB — LIPID PANEL
Cholesterol: 144 mg/dL (ref <200)
HDL: 82 mg/dL (ref >=50)
LDL Cholesterol (Calc): 48 mg/dL (calc)
Non-HDL Cholesterol (Calc): 62 mg/dL (calc) (ref <130)
Total CHOL/HDL Ratio: 1.8 (calc) (ref <5.0)
Triglycerides: 67 mg/dL (ref <150)

## 2022-02-22 LAB — COMPLETE METABOLIC PANEL WITH GFR
AG Ratio: 1.4 (calc) (ref 1.0–2.5)
ALT: 18 U/L (ref 6–29)
AST: 22 U/L (ref 10–35)
Albumin: 3.9 g/dL (ref 3.6–5.1)
Alkaline phosphatase (APISO): 82 U/L (ref 37–153)
BUN: 9 mg/dL (ref 7–25)
CO2: 29 mmol/L (ref 20–32)
Calcium: 9 mg/dL (ref 8.6–10.4)
Chloride: 103 mmol/L (ref 98–110)
Creat: 0.69 mg/dL (ref 0.60–1.00)
Globulin: 2.8 g/dL (calc) (ref 1.9–3.7)
Glucose, Bld: 165 mg/dL — ABNORMAL HIGH (ref 65–99)
Potassium: 2.8 mmol/L — ABNORMAL LOW (ref 3.5–5.3)
Sodium: 144 mmol/L (ref 135–146)
Total Bilirubin: 0.9 mg/dL (ref 0.2–1.2)
Total Protein: 6.7 g/dL (ref 6.1–8.1)
eGFR: 90 mL/min/{1.73_m2} (ref >=60)

## 2022-02-22 LAB — CBC WITH DIFFERENTIAL/PLATELET
Absolute Monocytes: 398 cells/uL (ref 200–950)
Basophils Absolute: 21 cells/uL (ref 0–200)
Basophils Relative: 0.4 %
Eosinophils Absolute: 32 cells/uL (ref 15–500)
Eosinophils Relative: 0.6 %
HCT: 39.8 % (ref 35.0–45.0)
Hemoglobin: 12.6 g/dL (ref 11.7–15.5)
Lymphs Abs: 922 cells/uL (ref 850–3900)
MCH: 28 pg (ref 27.0–33.0)
MCHC: 31.7 g/dL — ABNORMAL LOW (ref 32.0–36.0)
MCV: 88.4 fL (ref 80.0–100.0)
MPV: 12.2 fL (ref 7.5–12.5)
Monocytes Relative: 7.5 %
Neutro Abs: 3927 cells/uL (ref 1500–7800)
Neutrophils Relative %: 74.1 %
Platelets: 224 10*3/uL (ref 140–400)
RBC: 4.5 10*6/uL (ref 3.80–5.10)
RDW: 15.2 % — ABNORMAL HIGH (ref 11.0–15.0)
Total Lymphocyte: 17.4 %
WBC: 5.3 10*3/uL (ref 3.8–10.8)

## 2022-02-22 LAB — CULTURE, URINE COMPREHENSIVE
MICRO NUMBER:: 13981012
SPECIMEN QUALITY:: ADEQUATE

## 2022-02-22 LAB — MICROALBUMIN / CREATININE URINE RATIO
Creatinine, Urine: 36 mg/dL (ref 20–275)
Microalb Creat Ratio: 67 mcg/mg creat — ABNORMAL HIGH (ref <30)
Microalb, Ur: 2.4 mg/dL

## 2022-02-22 LAB — VITAMIN B12: Vitamin B-12: 472 pg/mL (ref 200–1100)

## 2022-02-22 LAB — TSH: TSH: 3.72 mIU/L (ref 0.40–4.50)

## 2022-02-22 LAB — VITAMIN D 25 HYDROXY (VIT D DEFICIENCY, FRACTURES): Vit D, 25-Hydroxy: 45 ng/mL (ref 30–100)

## 2022-02-23 ENCOUNTER — Other Ambulatory Visit: Payer: Self-pay | Admitting: Family Medicine

## 2022-02-23 MED ORDER — NITROFURANTOIN MONOHYD MACRO 100 MG PO CAPS: 100.0000 mg | ORAL_CAPSULE | Freq: Two times a day (BID) | ORAL | 0 refills | Status: DC

## 2022-02-24 ENCOUNTER — Telehealth: Payer: Self-pay | Admitting: *Deleted

## 2022-02-24 NOTE — Chronic Care Management (AMB) (Signed)
  Care Coordination  Outreach Note  02/24/2022 Name: Lisa [name] MRN: [id number] DOB: [birth year]   Care Coordination Outreach Attempts: An unsuccessful telephone outreach was attempted today to offer the patient information about available care coordination services as a benefit of their health plan.   Referral received   Follow Up Plan:  Additional outreach attempts will be made to offer the patient care coordination information and services.   Encounter Outcome:  No Answer  Julian Hy, [place] Direct Dial: [phone]

## 2022-02-25 ENCOUNTER — Other Ambulatory Visit: Payer: Self-pay

## 2022-02-25 ENCOUNTER — Telehealth: Payer: Self-pay | Admitting: *Deleted

## 2022-02-25 NOTE — Telephone Encounter (Signed)
Pt called back about lab results and instructions. She was driving when spoke to someone and did not remember what to do. I reviewed results and provider comments as below and pt states she would rather Dr Ancil Boozer call in a potassium rx. To [facility] [address]. She will take for 4 days and then come have lab work drawn.   Steele Sizer, MD  02/21/2022  4:15 PM EDT     Anemia and white count back to normal  Potassium is very low, she needs to take potassium pills 4 times a day until Monday and return for level recheck, please check to see if we can add magnesium level and also a aldosterone renin level - if not she will need to come in to make sure not over producing aldosterone She has protein in urine - went up again, she is taking medication to protect her kidney already  Glucose was normal with blood test  Other labs normal

## 2022-02-26 ENCOUNTER — Other Ambulatory Visit: Payer: Self-pay | Admitting: Family Medicine

## 2022-02-26 ENCOUNTER — Telehealth: Payer: Self-pay

## 2022-02-26 ENCOUNTER — Telehealth: Payer: Self-pay | Admitting: *Deleted

## 2022-02-26 MED ORDER — POTASSIUM CHLORIDE CRYS ER 20 MEQ PO TBCR: 20.0000 meq | EXTENDED_RELEASE_TABLET | Freq: Two times a day (BID) | ORAL | 0 refills | Status: DC

## 2022-02-26 NOTE — Telephone Encounter (Signed)
Copied from [place] [id number]. Topic: General - Inquiry >> Feb 26, 2022  8:56 AM Elliot Dally wrote: Reason for CRM: Called to let patient know that per Dr. Ancil Boozer' she had a rx for potassium tablets given by cardiologist  and She needs to take them for 4 days if she is not currently taking them (Klor-Con) and return for labs in 4 days. Lab hours (8-11:30am or 1:30-3:30pm)

## 2022-02-26 NOTE — Telephone Encounter (Signed)
Left voicemail to let patient know she currently has a prescription for Chlor-kon and to follow up with [place] for lab draw per Dr. Ancil Boozer. Awaiting return call for patient confirmation.

## 2022-02-26 NOTE — Telephone Encounter (Signed)
Pt calling back. States she was given Losartan potassium at pharmacy which she states she is already taking. States she was told to take "Just plain potassium for 4 days and I need that called in." Pt expresses frustration.  Please advise. CB# [phone]

## 2022-02-27 NOTE — Chronic Care Management (AMB) (Deleted)
  Care Coordination   Note   02/27/2022 Name: Stela Iwasaki MRN: [id number] DOB: December 05, 1945  Salimata Christenson is a 75 y.o. year old female who sees Steele Sizer, MD for primary care. I reached out to Geoffery Lyons by phone today to offer care coordination services.  Ms. Peaden was given information about Care Coordination services today including:   The Care Coordination services include support from the care team which includes your Nurse Coordinator, Clinical Social Worker, or Pharmacist.  The Care Coordination team is here to help remove barriers to the health concerns and goals most important to you. Care Coordination services are voluntary, and the patient may decline or stop services at any time by request to their care team member.   Care Coordination Consent Status: {CG CONSENT STATUS:27887}  Follow up plan:  {CG FOLLOW IT:25498}  Encounter Outcome:  {ENCOUTCOME:27770}

## 2022-02-27 NOTE — Chronic Care Management (AMB) (Signed)
  Care Coordination  Outreach Note  02/27/2022 Name: Kyra Laffey MRN: [id number] DOB: [birth year]   Care Coordination Outreach Attempts: A second unsuccessful outreach was attempted today to offer the patient with information about available care coordination services as a benefit of their health plan.     Follow Up Plan:  Additional outreach attempts will be made to offer the patient care coordination information and services.   Encounter Outcome:  Pt. Request to Call Register, [place] Direct Dial: [phone]

## 2022-03-03 NOTE — Chronic Care Management (AMB) (Signed)
  Care Coordination  Outreach Note  03/03/2022 Name: Paitynn Mikus MRN: [id number] DOB: Jul 09, 1945   Care Coordination Outreach Attempts: A third unsuccessful outreach was attempted today to offer the patient with information about available care coordination services as a benefit of their health plan.   Attempt to schedule from referral   Follow Up Plan:  No further outreach attempts will be made at this time. We have been unable to contact the patient to offer or enroll patient in care coordination services  Encounter Outcome:  No Answer  Julian Hy, [place] Direct Dial: [phone]

## 2022-03-03 NOTE — Chronic Care Management (AMB) (Signed)
  Care Coordination   Note   03/03/2022 Name: Diamantina Edinger MRN: [id number] DOB: 1945/11/24  Sahar Ryback is a 76 y.o. year old female who sees Steele Sizer, MD for primary care. I reached out to Geoffery Lyons by phone today to offer care coordination services.  Ms. Molla was given information about Care Coordination services today including:   The Care Coordination services include support from the care team which includes your Nurse Coordinator, Clinical Social Worker, or Pharmacist.  The Care Coordination team is here to help remove barriers to the health concerns and goals most important to you. Care Coordination services are voluntary, and the patient may decline or stop services at any time by request to their care team member.   Care Coordination Consent Status: Patient agreed to services and verbal consent obtained.   Follow up plan:  Telephone appointment with care coordination team member scheduled for:  03/07/2022  Encounter Outcome:  Pt. Scheduled from referral   Julian Hy, [place] Direct Dial: [phone]

## 2022-03-04 NOTE — Progress Notes (Signed)
03/05/2022 12:20 PM   Lisa [name] [birth year] [id number]  Referring provider: Steele Sizer, MD [address] [place],  [place] [address]  Chief Complaint  Patient presents with   New Patient (Initial Visit)   Urinary Incontinence    HPI: 76 year old female who presents today for further evaluation of nocturnal enuresis.  She has presents here with multiple medical comorbidities including CHF on Lasix, type 2 diabetes amongst others.  She had a urine culture which was sent on 02/19/2022 in the absence of a urinalysis.  This ended up growing a low colony count of Klebsiella for which she was treated with Macrobid.  Her only urinary symptom the time was the above.  She has not actually started taking this medication.  She reports her symptoms started about a year ago.  2 years ago, she was completely normal and had no incontinence.  About a year ago, she started having both urinary and fecal incontinence.  She has an appointment with GI to discuss her bowel issues.  She reports that she will sometimes get the urge to urinate, urinary frequency and cannot get to the bathroom in time.  This is exacerbated at nighttime.  She goes through large diapers and often soils her clothes in her bed.  This is very disruptive and upsetting.  She denies any dysuria or gross hematuria.    No vaginal issues.  She is not sexually active.  No issues with urinary tract infections other than the aforementioned.  Initially, PVR was 161.  She returned to the bathroom to give a sample and felt that she emptied significantly more.  She does feel like she empties for the most part when she urinates at home.  Results for orders placed or performed in visit on 03/05/22  Microscopic Examination   Urine  Result Value Ref Range   WBC, UA 0-5 0 - 5 /hpf   RBC, Urine 0-2 0 - 2 /hpf   Epithelial Cells (non renal) 0-10 0 - 10 /hpf   Bacteria, UA None seen None seen/Few  Urinalysis, Complete  Result  Value Ref Range   Specific Gravity, UA <1.005 (L) 1.005 - 1.030   pH, UA 6.0 5.0 - 7.5   Color, UA Yellow Yellow   Appearance Ur Clear Clear   Leukocytes,UA Negative Negative   Protein,UA Negative Negative/Trace   Glucose, UA 2+ (A) Negative   Ketones, UA Negative Negative   RBC, UA Negative Negative   Bilirubin, UA Negative Negative   Urobilinogen, Ur 0.2 0.2 - 1.0 mg/dL   Nitrite, UA Negative Negative   Microscopic Examination See below:   Bladder Scan (Post Void Residual) in office  Result Value Ref Range   Scan Result 161ML       PMH: Past Medical History:  Diagnosis Date   Blindness of right eye    CHF (congestive heart failure) ([place])    Depression    Diabetes mellitus without complication (HCC)    DVT of leg (deep venous thrombosis) (HCC)    Glaucoma    Hyperlipidemia    Hypertension    Vascular disease     Surgical History: Past Surgical History:  Procedure Laterality Date   ABDOMINAL HYSTERECTOMY  1992   Total   EYE SURGERY     TOE AMPUTATION Left 2011   All of her toes on the left foot   VASCULAR SURGERY      Home Medications:  Allergies as of 03/05/2022       Reactions  Other Itching   Nucinta   Tapentadol Hcl    Actos [pioglitazone]    History of CHF        Medication List        Accurate as of March 05, 2022 11:59 PM. If you have any questions, ask your nurse or doctor.          Accu-Chek Guide test strip Generic drug: glucose blood DISPENSE BASED ON PATIENT AND INSURANCE PREFERENCE. USE UP TO FOUR TIMES DAILY AS DIRECTED. (FOR ICD-10 E10.9, E11.9).   Accu-Chek Softclix Lancets lancets See admin instructions.   aspirin 81 MG tablet Take 81 mg by mouth daily.   atropine 1 % ophthalmic solution Place 1 drop into the right eye 3 (three) times daily.   B-12 1000 MCG Subl Place 1 tablet under the tongue daily.   blood glucose meter kit and supplies Dispense based on patient and insurance preference. Use up to four times  daily as directed. (FOR ICD-10 E10.9, E11.9).   blood glucose meter kit and supplies Kit DISPENSE BASED ON PATIENT AND INSURANCE PREFERENCE. USE UP TO FOUR TIMES DAILY AS DIRECTED. (FOR ICD-10 E10.9, E11.9).   brimonidine 0.2 % ophthalmic solution Commonly known as: ALPHAGAN Place 1 drop into both eyes 2 (two) times daily.   citalopram 10 MG tablet Commonly known as: CELEXA Take 1 tablet (10 mg total) by mouth daily.   clopidogrel 75 MG tablet Commonly known as: PLAVIX Take 1 tablet (75 mg total) by mouth daily.   dapagliflozin propanediol 10 MG Tabs tablet Commonly known as: Farxiga TAKE 1 TABLET BY MOUTH EVERY DAY BEFORE BREAKFAST   dorzolamide-timolol 22.3-6.8 MG/ML ophthalmic solution Commonly known as: COSOPT SMARTSIG:In Eye(s)   fluconazole 100 MG tablet Commonly known as: DIFLUCAN Take 1 tablet (100 mg total) by mouth every other day. Hold citalopram when taking this medication   furosemide 40 MG tablet Commonly known as: LASIX Take 1 tablet (40 mg total) by mouth daily.   Gemtesa 75 MG Tabs Generic drug: Vibegron Take 75 mg by mouth daily. Started by: Hollice Espy, MD   losartan 100 MG tablet Commonly known as: COZAAR Take 0.5 tablets (50 mg total) by mouth daily.   Metamucil 0.52 g capsule Generic drug: psyllium Take 1 capsule (0.52 g total) by mouth daily.   metoprolol succinate 25 MG 24 hr tablet Commonly known as: Toprol XL Take 1 tablet (25 mg total) by mouth daily. Take with or immediately following a meal.   multivitamin tablet Take 1 tablet by mouth daily.   nitrofurantoin (macrocrystal-monohydrate) 100 MG capsule Commonly known as: Macrobid Take 1 capsule (100 mg total) by mouth 2 (two) times daily.   nitroGLYCERIN 0.4 MG SL tablet Commonly known as: NITROSTAT Place 1 tablet (0.4 mg total) under the tongue every 5 (five) minutes as needed for chest pain.   potassium chloride SA 20 MEQ tablet Commonly known as: Klor-Con M20 Take 1  tablet (20 mEq total) by mouth 2 (two) times daily. But three times a day for the next few days and return for labs   prednisoLONE acetate 1 % ophthalmic suspension Commonly known as: PRED FORTE Place into the right eye.   rosuvastatin 10 MG tablet Commonly known as: CRESTOR Take 1 tablet (10 mg total) by mouth daily.   sitaGLIPtin 100 MG tablet Commonly known as: Januvia Take 1 tablet (100 mg total) by mouth daily.   Vitamin D (Ergocalciferol) 1.25 MG (50000 UNIT) Caps capsule Commonly known as: DRISDOL TAKE 1 CAPSULE (  50,000 UNITS TOTAL) BY MOUTH EVERY 7 (SEVEN) DAYS        Allergies:  Allergies  Allergen Reactions   Other Itching    Nucinta   Tapentadol Hcl    Actos [Pioglitazone]     History of CHF    Family History: Family History  Problem Relation Age of Onset   Heart disease Father    Diabetes Mother    Diabetes Brother    Kidney disease Brother        Transplant   Heart disease Brother    Diabetes Sister    Diabetes Sister     Social History:  reports that she has been smoking cigarettes. She started smoking about 21 years ago. She has a 10.00 pack-year smoking history. She has never used smokeless tobacco. She reports that she does not drink alcohol and does not use drugs.   Physical Exam: Ht $Remove'5\' 4"'byjiAIX$  (1.626 m)   Wt 127 lb (57.6 kg)   BMI 21.80 kg/m   Constitutional:  Alert and oriented, No acute distress. HEENT: [place] AT, moist mucus membranes.  Trachea midline, no masses. Neurologic: Grossly intact, no focal deficits, moving all 4 extremities. Psychiatric: Normal mood and affect.  Laboratory Data: Lab Results  Component Value Date   WBC 5.3 02/19/2022   HGB 12.6 02/19/2022   HCT 39.8 02/19/2022   MCV 88.4 02/19/2022   PLT 224 02/19/2022    Lab Results  Component Value Date   CREATININE 0.69 02/19/2022    Lab Results  Component Value Date   HGBA1C 6.3 (A) 02/19/2022   Assessment & Plan:    1. Urge incontinence Severe urge  incontinence with relatively abrupt onset  Given the severity and chronicity, it is reasonable to rule out underlying issues.  I recommended cystoscopy to evaluate her bladder to rule out bladder cancer especially in light of her smoking history.  She is emptying reasonably well, encouraged double void and Crede technique to completely empty her bladder.  There is no evidence that she has a urinary tract infection, urinalysis today is negative and thus no need to take the Macrobid prescribed which she has yet to begin.  In addition to the above, encouraged her to follow-up with GI for her fecal incontinence.  She does have risk factors for urge incontinence which include diabetes and Lasix use amongst others.  Lastly, she was given samples today of Gemtesa 75 mg to try over the next month.  We will revisit her urinary symptoms at the time of cystoscopy.   Return in about 4 weeks (around 04/02/2022) for cysto.  Hollice Espy, MD  [facility] [address], [place], [place] [address] [phone]

## 2022-03-05 ENCOUNTER — Ambulatory Visit (INDEPENDENT_AMBULATORY_CARE_PROVIDER_SITE_OTHER): Payer: Medicare Other | Admitting: Urology

## 2022-03-05 ENCOUNTER — Encounter: Payer: Self-pay | Admitting: Urology

## 2022-03-05 VITALS — Ht 64.0 in | Wt 127.0 lb

## 2022-03-05 DIAGNOSIS — N3941 Urge incontinence: Secondary | ICD-10-CM | POA: Diagnosis not present

## 2022-03-05 DIAGNOSIS — R32 Unspecified urinary incontinence: Secondary | ICD-10-CM

## 2022-03-05 LAB — BLADDER SCAN AMB NON-IMAGING

## 2022-03-05 MED ORDER — GEMTESA 75 MG PO TABS: 75.0000 mg | ORAL_TABLET | Freq: Every day | ORAL | 0 refills | Status: DC

## 2022-03-06 DIAGNOSIS — H401122 Primary open-angle glaucoma, left eye, moderate stage: Secondary | ICD-10-CM | POA: Diagnosis not present

## 2022-03-06 LAB — URINALYSIS, COMPLETE
Bilirubin, UA: NEGATIVE
Ketones, UA: NEGATIVE
Leukocytes,UA: NEGATIVE
Nitrite, UA: NEGATIVE
Protein,UA: NEGATIVE
RBC, UA: NEGATIVE
Specific Gravity, UA: <1.005 — ABNORMAL LOW (ref 1.005–1.030)
Urobilinogen, Ur: 0.2 mg/dL (ref 0.2–1.0)
pH, UA: 6 (ref 5.0–7.5)

## 2022-03-06 LAB — MICROSCOPIC EXAMINATION: Bacteria, UA: NONE SEEN

## 2022-03-07 ENCOUNTER — Telehealth: Payer: Self-pay | Admitting: *Deleted

## 2022-03-07 ENCOUNTER — Encounter: Payer: Self-pay | Admitting: *Deleted

## 2022-03-07 NOTE — Patient Outreach (Signed)
  Care Coordination   03/07/2022 Name: Lisa [name] MRN: [id number] DOB: January 27, 1946   Care Coordination Outreach Attempts:  An unsuccessful telephone outreach was attempted for a scheduled appointment today.  Follow Up Plan:  Additional outreach attempts will be made to offer the patient care coordination information and services.   Encounter Outcome:  No Answer  Care Coordination Interventions Activated:  No   Care Coordination Interventions:  Yes, provided    Elliot Gurney, [place] Worker  [facility] Care Management [phone]

## 2022-03-10 DIAGNOSIS — H401122 Primary open-angle glaucoma, left eye, moderate stage: Secondary | ICD-10-CM | POA: Diagnosis not present

## 2022-03-10 LAB — HM DIABETES EYE EXAM

## 2022-03-11 ENCOUNTER — Telehealth: Payer: Self-pay | Admitting: *Deleted

## 2022-03-11 NOTE — Chronic Care Management (AMB) (Signed)
  Care Coordination Note  03/11/2022 Name: Lisa [name] MRN: [id number] DOB: [birth year]  Lisa [name] is a 76 y.o. year old female who is a primary care patient of Steele Sizer, MD and is actively engaged with the care management team. I reached out to Geoffery Lyons by phone today to assist with re-scheduling an initial visit with the Licensed Clinical Social Worker  Follow up plan: Unsuccessful telephone outreach attempt made. A HIPAA compliant phone message was left for the patient providing contact information and requesting a return call.   [place]  Direct Dial: [phone]

## 2022-03-13 NOTE — Chronic Care Management (AMB) (Signed)
  Care Coordination Note  03/13/2022 Name: Lisa [name] MRN: [id number] DOB: May 15, 1946  Lisa [name] is a 76 y.o. year old female who is a primary care patient of Steele Sizer, MD and is actively engaged with the care management team. I reached out to Geoffery Lyons by phone today to assist with re-scheduling an initial visit with the Licensed Clinical Social Worker  Follow up plan: Unsuccessful telephone outreach attempt made. A HIPAA compliant phone message was left for the patient providing contact information and requesting a return call.   [place]  Direct Dial: [phone]

## 2022-03-17 ENCOUNTER — Telehealth: Payer: Self-pay | Admitting: *Deleted

## 2022-03-17 NOTE — Chronic Care Management (AMB) (Signed)
  Care Coordination Note  03/17/2022 Name: Opal Dinning MRN: [id number] DOB: [birth year]  Brinkley Peet is a 76 y.o. year old female who is a primary care patient of Steele Sizer, MD and is actively engaged with the care management team. I reached out to Geoffery Lyons by phone today to assist with re-scheduling an initial visit with the Licensed Clinical Social Worker  Follow up plan: We have been unable to make contact with the patient for follow up. The care management team is available to follow up with the patient after provider conversation with the patient regarding recommendation for care management engagement and subsequent re-referral to the care management team.   [place]  Direct Dial: [phone]

## 2022-03-17 NOTE — Chronic Care Management (AMB) (Signed)
  Care Coordination   Note   03/17/2022 Name: Lisa [name] MRN: [id number] DOB: [birth year]  Lisa [name] is a 76 y.o. year old female who sees Steele Sizer, MD for primary care. I reached out to Geoffery Lyons by phone today to offer care coordination services.  Lisa [name] was given information about Care Coordination services today including:   The Care Coordination services include support from the care team which includes your Nurse Coordinator, Clinical Social Worker, or Pharmacist.  The Care Coordination team is here to help remove barriers to the health concerns and goals most important to you. Care Coordination services are voluntary, and the patient may decline or stop services at any time by request to their care team member.   Care Coordination Consent Status: Patient agreed to services and verbal consent obtained.   Follow up plan:  Telephone appointment with care coordination team member scheduled for:  03/19/22  Encounter Outcome:  Pt. Scheduled  [place]  Direct Dial: [phone]

## 2022-03-19 ENCOUNTER — Ambulatory Visit: Payer: Self-pay | Admitting: *Deleted

## 2022-03-19 NOTE — Patient Outreach (Signed)
  Care Coordination   Initial Visit Note   03/19/2022 Name: Lisa [name] MRN: [id number] DOB: 22-Oct-1945  Lisa [name] is a 76 y.o. year old female who sees Steele Sizer, MD for primary care. I spoke with  Geoffery Lyons by phone today.  What matters to the patients health and wellness today?  "I need to see a therapist"    Goals Addressed             This Visit's Progress    "I need to speak to a therapist"       Care Coordination Interventions: Patient states depressed mood-tends to isolate, lacks energy and social support Financial challenges discussed-discussed plan to sell her home to reside in a smaller apartment or senior housing Patient describes limited family support Patient interested in mental health counseling to address symptoms of depression  Solution-Focused Strategies employed:  Active listening / Reflection utilized  Participation in counseling encouraged  Verbalization of feelings encouraged  Discussed self-care action plan: Increasing social activities, prioritizing tasks, leaning on spiritual life-making appointment with a therapist-encouraged patient to contact her insurance company for [employer] mental health providers-also provided the following resources: [facility] [phone], [facility] [phone]         SDOH assessments and interventions completed:  Yes  SDOH Interventions Today    Flowsheet Row Most Recent Value  SDOH Interventions   Food Insecurity Interventions Intervention Not Indicated  Housing Interventions Intervention Not Indicated  Transportation Interventions Intervention Not Indicated  Depression Interventions/Treatment  Counseling  Social Connections Interventions Intervention Not Indicated        Care Coordination Interventions Activated:  Yes  Care Coordination Interventions:  Yes, provided   Follow up plan: Follow up call scheduled for 04/02/22    Encounter Outcome:  Pt. Visit Completed

## 2022-03-19 NOTE — Patient Instructions (Signed)
Visit Information  Thank you for taking time to visit with me today. Please don't hesitate to contact me if I can be of assistance to you.   Following are the goals we discussed today:   Goals Addressed             This Visit's Progress    "I need to speak to a therapist"       Care Coordination Interventions: Patient states depressed mood-tends to isolate, lacks energy and social support Financial challenges discussed-discussed plan to sell her home to reside in a smaller apartment or senior housing Patient describes limited family support Patient interested in mental health counseling to address symptoms of depression  Solution-Focused Strategies employed:  Active listening / Reflection utilized  Participation in counseling encouraged  Verbalization of feelings encouraged  Discussed self-care action plan: Increasing social activities, prioritizing tasks, leaning on spiritual life-making appointment with a therapist-encouraged patient to contact her insurance company for [employer] mental health providers-also provided the following resources: [facility] [phone], [place] [phone]         Our next appointment is by telephone on 04/02/22 at 9am  Please call the care guide team at [phone] if you need to cancel or reschedule your appointment.   If you are experiencing a Mental Health or [place] or need someone to talk to, please call the Suicide and Crisis Lifeline: 988 call 911   Patient verbalizes understanding of instructions and care plan provided today and agrees to view in [place]. Active MyChart status and patient understanding of how to access instructions and care plan via MyChart confirmed with patient.     Telephone follow up appointment with care management team member scheduled for: 04/02/22  Elliot Gurney, [place] Worker  [facility] Care Management [phone]

## 2022-04-02 ENCOUNTER — Ambulatory Visit: Payer: Self-pay | Admitting: *Deleted

## 2022-04-02 NOTE — Patient Instructions (Signed)
Visit Information  Thank you for taking time to visit with me today. Please don't hesitate to contact me if I can be of assistance to you.   Following are the goals we discussed today:   Goals Addressed             This Visit's Progress    "I need to speak to a therapist"       Care Coordination Interventions: Follow up phone call to patient to follow up on mental health resources provided Patient confirms "not much has changed" since the last call- she further confirms that she has not followed up on the mental health resources previously provided Continued encouragement provided to contact her insurance company for in-network mental health providers-also provided the following resources: [facility] [phone], [facility] [phone]         Our next appointment is by telephone on 04/17/22 at 11:30am  Please call the care guide team at [phone] if you need to cancel or reschedule your appointment.   If you are experiencing a Mental Health or Behavioral Health Crisis or need someone to talk to, please call the Suicide and Crisis Lifeline: 988   Patient verbalizes understanding of instructions and care plan provided today and agrees to view in MyChart. Active MyChart status and patient understanding of how to access instructions and care plan via MyChart confirmed with patient.     Telephone follow up appointment with care management team member scheduled for: 04/17/22  Verna Czech, LCSW Clinical Social Worker  [facility] Care Management [phone]

## 2022-04-02 NOTE — Patient Outreach (Signed)
  Care Coordination   Follow Up Visit Note   04/02/2022 Name: Janyia Guion MRN: [id number] DOB: 05-Jan-1946  Holley Kocurek is a 76 y.o. year old female who sees Alba Cory, MD for primary care. I spoke with  [name] by phone today.  What matters to the patients health and wellness today?  Mental health resources    Goals Addressed             This Visit's Progress    "I need to speak to a therapist"       Care Coordination Interventions: Follow up phone call to patient to follow up on mental health resources provided Patient confirms "not much has changed" since the last call- she further confirms that she has not followed up on the mental health resources previously provided Continued encouragement provided to contact her insurance company for in-network mental health providers-also provided the following resources: [facility] [phone] Health [phone]         SDOH assessments and interventions completed:  No     Care Coordination Interventions Activated:  Yes  Care Coordination Interventions:  Yes, provided   Follow up plan: Follow up call scheduled for 04/16/22    Encounter Outcome:  Pt. Visit Completed

## 2022-04-08 ENCOUNTER — Ambulatory Visit (INDEPENDENT_AMBULATORY_CARE_PROVIDER_SITE_OTHER): Payer: Medicare Other | Admitting: Urology

## 2022-04-08 VITALS — Ht 64.0 in | Wt 127.0 lb

## 2022-04-08 DIAGNOSIS — R32 Unspecified urinary incontinence: Secondary | ICD-10-CM | POA: Diagnosis not present

## 2022-04-08 DIAGNOSIS — N3281 Overactive bladder: Secondary | ICD-10-CM | POA: Diagnosis not present

## 2022-04-08 DIAGNOSIS — N3941 Urge incontinence: Secondary | ICD-10-CM

## 2022-04-08 LAB — MICROSCOPIC EXAMINATION

## 2022-04-08 LAB — URINALYSIS, COMPLETE
Bilirubin, UA: NEGATIVE
Glucose, UA: NEGATIVE
Ketones, UA: NEGATIVE
Leukocytes,UA: NEGATIVE
Nitrite, UA: NEGATIVE
Protein,UA: NEGATIVE
Specific Gravity, UA: 1.015 (ref 1.005–1.030)
Urobilinogen, Ur: 0.2 mg/dL (ref 0.2–1.0)
pH, UA: 5.5 (ref 5.0–7.5)

## 2022-04-08 MED ORDER — GEMTESA 75 MG PO TABS: 75.0000 mg | ORAL_TABLET | Freq: Every day | ORAL | 11 refills | Status: DC

## 2022-04-08 NOTE — Progress Notes (Signed)
   04/08/22  CC:  Chief Complaint  Patient presents with   Cysto    HPI: 76 year old female with a personal history of urgency frequency, incontinence and enuresis who returns today for cystoscopy.  Notably, last appointment she was started on Gemtesa 75 mg and mentions today that she has had fairly decent response to this.  She will have an occasional accident at night but now has much more control over urination.  Her only side effect is runny nose and she does notice some variation in her blood sugars.  She is nervous today about cystoscopy.  She has had negative experiences with physicians in the past.  Ultimately, she is agreeable to plan as she is concerned about underlying bladder pathology.  Height 5\' 4"  (1.626 m), weight 127 lb (57.6 kg). NED. A&Ox3.   No respiratory distress   Abd soft, NT, ND Normal external genitalia with patent urethral meatus  Cystoscopy Procedure Note  Patient identification was confirmed, informed consent was obtained, and patient was prepped using Betadine solution.  Lidocaine jelly was administered per urethral meatus.    Procedure: - Flexible cystoscope introduced, without any difficulty.   - Thorough search of the bladder revealed:    normal urethral meatus    normal urothelium    no stones    no ulcers     no tumors    no urethral polyps    no trabeculation  - Ureteral orifices were normal in position and appearance.  There is some descent of the trigone consistent with cystocele.  Post-Procedure: - Patient tolerated the procedure well  Assessment/ Plan:  1. Enuresis/urge incontinence/OAB Patient was reassured today, cystoscopy was unremarkable  She is done well with Gemtesa 75 mg, sent this prescription to her pharmacy.  To let know if it is prohibitively expensive.  She may be changing Medicare advantage plans next year if its not affordable.  Discussed symptoms likely related to longstanding DM with resulting bladder  overactivity from neuropathy.    Plan to have her follow-up next year for symptom recheck. - Urinalysis, Complete - Vibegron (GEMTESA) 75 MG TABS; Take 75 mg by mouth daily.  Dispense: 30 tablet; Refill: 11    Return in about 1 year (around 04/09/2023) for recheck urinary symptoms/ PVR with PA.  04/11/2023, MD

## 2022-04-10 ENCOUNTER — Other Ambulatory Visit: Payer: Self-pay | Admitting: Family Medicine

## 2022-04-10 DIAGNOSIS — E559 Vitamin D deficiency, unspecified: Secondary | ICD-10-CM

## 2022-04-10 NOTE — Telephone Encounter (Signed)
Requested medication (s) are due for refill today: yes  Requested medication (s) are on the active medication list: yes    Last refill: 12/18/21  #8  1 refill  Future visit scheduled yes 06/27/22  Notes to clinic:Not delegated, please review. Thank you.  Requested Prescriptions  Pending Prescriptions Disp Refills   Vitamin D, Ergocalciferol, (DRISDOL) 1.25 MG (50000 UNIT) CAPS capsule [Pharmacy Med Name: VITAMIN D2 1.25MG (50,000 UNIT)] 8 capsule 1    Sig: TAKE 1 CAPSULE (50,000 UNITS TOTAL) BY MOUTH EVERY 7 (SEVEN) DAYS     Endocrinology:  Vitamins - Vitamin D Supplementation 2 Failed - 04/10/2022  1:47 PM      Failed - Manual Review: Route requests for 50,000 IU strength to the provider      Passed - Ca in normal range and within 360 days    Calcium  Date Value Ref Range Status  02/19/2022 9.0 8.6 - 10.4 mg/dL Final         Passed - Vitamin D in normal range and within 360 days    Vit D, 25-Hydroxy  Date Value Ref Range Status  02/19/2022 45 30 - 100 ng/mL Final    Comment:    Vitamin D Status         25-OH Vitamin D: . Deficiency:                    <20 ng/mL Insufficiency:             20 - 29 ng/mL Optimal:                 > or = 30 ng/mL . For 25-OH Vitamin D testing on patients on  D2-supplementation and patients for whom quantitation  of D2 and D3 fractions is required, the QuestAssureD(TM) 25-OH VIT D, (D2,D3), LC/MS/MS is recommended: order  code 44034 (patients >48yrs). . See Note 1 . Note 1 . For additional information, please refer to  http://education.Quest[facility].com/faq/FAQ199  (This link is being provided for informational/ educational purposes only.)          Passed - Valid encounter within last 12 months    Recent Outpatient Visits           1 month ago Hypertension associated with type 2 diabetes mellitus [facility])   [facility] [place], Danna Hefty, MD   7 months ago Hypertension associated with type 2 diabetes mellitus [facility])    [facility] [place], Danna Hefty, MD   8 months ago Hypertension associated with type 2 diabetes mellitus [facility])   [facility] Alba Cory, MD   9 months ago Type 2 diabetes mellitus without complication, without long-term current use of insulin [facility])   [facility] Alba Cory, MD   1 year ago Hypokalemia   [facility] Caro Laroche, DO       Future Appointments             In 2 months Carlynn Purl, Danna Hefty, MD [facility]   In 12 months [place], Elana Alm [facility] Urology [facility]

## 2022-04-16 ENCOUNTER — Ambulatory Visit: Payer: Self-pay | Admitting: *Deleted

## 2022-04-16 NOTE — Patient Outreach (Signed)
  Care Coordination   04/16/2022 Name: Lisa [name] MRN: [id number] DOB: 19-Feb-1946   Care Coordination Outreach Attempts:  An unsuccessful telephone outreach was attempted for a scheduled appointment today.  Follow Up Plan:  Additional outreach attempts will be made to offer the patient care coordination information and services.   Encounter Outcome:  No Answer  Care Coordination Interventions Activated:  No   Care Coordination Interventions:  No, not indicated    Chandelle Harkey, LCSW Clinical Social Worker  [facility] Care Management [phone]

## 2022-05-20 ENCOUNTER — Telehealth: Payer: Self-pay | Admitting: *Deleted

## 2022-05-20 NOTE — Patient Outreach (Signed)
  Care Coordination   Follow Up Visit Note   05/20/2022 Name: Lisa [name] MRN: [id number] DOB: 27-Jun-1945  Lisa [name] is a 76 y.o. year old female who sees Alba Cory, MD for primary care. I spoke with  [name] by phone today.  What matters to the patients health and wellness today?  Mental Health follow up    Goals Addressed             This Visit's Progress    "I need to speak to a therapist"       Care Coordination Interventions: Follow up phone call to patient to follow up on mental health resources provided Patient confirms "I am very stressed and do need to see a therapist"- she confirms that she did call a therapist from list provided, they returned her call but she has not followed up yet Patient agreeable to calling them back to schedule. She did discuss having some financial issues, specifically with her utility bill Contacting [place] discussed as well as the Department of Social Services discussed as an option for possible assistance Continued encouragement provided to contact her insurance company for [employer] health providers-also provided the following resources: [employer] [phone], [facility] [phone] Patient encouraged to contact this social worker with any additional community resource needs         SDOH assessments and interventions completed:  No     Care Coordination Interventions:  Yes, provided   Follow up plan: No further intervention required.   Encounter Outcome:  Pt. Visit Completed

## 2022-05-20 NOTE — Patient Instructions (Signed)
Visit Information  Thank you for taking time to visit with me today. Please don't hesitate to contact me if I can be of assistance to you.   Following are the goals we discussed today:   Goals Addressed             This Visit's Progress    "I need to speak to a therapist"       Care Coordination Interventions: Follow up phone call to patient to follow up on mental health resources provided Patient confirms "I am very stressed and do need to see a therapist"- she confirms that she did call a therapist from list provided, they returned her call but she has not followed up yet Patient agreeable to calling them back to schedule. She did discuss having some financial issues, specifically with her utility bill Contacting [place] discussed as well as the Department of Social Services discussed as an option for possible assistance Continued encouragement provided to contact her insurance company for [employer] health providers-also provided the following resources: [employer] [phone], [facility] [phone] Patient encouraged to contact this social worker with any additional community resource needs          Please call the care guide team at [phone] if you need to cancel or reschedule your appointment.   If you are experiencing a Mental Health or Behavioral Health Crisis or need someone to talk to, please call the Suicide and Crisis Lifeline: 988   Patient verbalizes understanding of instructions and care plan provided today and agrees to view in MyChart. Active MyChart status and patient understanding of how to access instructions and care plan via MyChart confirmed with patient.     No further follow up required: patient to contact this Child psychotherapist with any additional community resource needs  [employer], LCSW Clinical Social Worker  [facility] Care Management [phone]

## 2022-06-25 ENCOUNTER — Ambulatory Visit: Payer: Medicare Other | Admitting: Family Medicine

## 2022-06-26 NOTE — Progress Notes (Deleted)
Name: Lisa [name]   MRN: [id number]    DOB: [birth year]   Date:06/26/2022       Progress Note  Subjective  Chief Complaint  Follow Up  HPI  DMII: Glucose at home around 120  She is taking Januvia 100 mg, Farxiga and Actos, today when she arrived she had a cup of coffee and two  peppermints before she came in ,  and glucose was down to 45, we gave her a snack. A1C is at goal at 6.5 % . She denies polyphagia, polydipsia but has polyuria . She has PAD and HTN. She is on ARB, sees podiatrist and vascular surgeo. We will stop actos, explained she cannot skip meals.  Malnutrition: she was seen by Dr. Bonna Gains int he past for weight loss ,diarrhea and incontinence but she did not go back for further evaluation, weight is stable but bowel movements still frequent, no longer wanting to leave the house due to fecal smearing and diarrhea. Explained importance of going back to GI   Depression: she is feeling down again, she continues to feel frustrated about her health, having worsening symptoms of diarrhea, also has urinary frequency and urgency, not leaving her house. She is getting more isolated because she does not feel comfortable leaving her house. She is taking citalopram.    HTN/CHF: she was admitted in Nov 2022 , she is on Losartan and Metoprolol, she is off Norvasc, taking furosemide in am's She denies side effects of medications. No chest pain, SOB is stable with activity.  She takes furosemide prn. We will stop pioglitazone today    Nocturnal enuresis: symptoms are getting worse, we will refer her to Urologist now    PAD: she was recently seen by Dr. Lucky Cowboy, 01/2021 and ABI was 0.67  on the left  and 1.1 on right.  History of left distal foot amputation, she has phantom pain , she has pain on both legs from PAD. She also has a history of neuropathy and states staggers when walking. She is up to date with podiatrist , she is due for follow up with vascular surgeon    Low vitamin D and B12: taking  supplementation , we will recheck labs    Patient Active Problem List   Diagnosis Date Noted   Unilateral amputation of left foot ([place]) 02/19/2022   Hypertension associated with type 2 diabetes mellitus ([place]) 02/19/2022   Hypertensive emergency 04/09/2021   Chronic diastolic heart failure ([place]) 04/09/2021   Nicotine dependence 04/09/2021   Bruising 02/26/2021   Syncope and collapse 02/26/2021   Prolonged QT interval 02/26/2021   Uncontrolled type 2 diabetes with eye complications 0000000   DM (diabetes mellitus), type 2 with peripheral vascular complications ([place]) [id number]   Amputation of left foot ([place]) 12/02/2014   Benign essential HTN 12/02/2014   Depression, major, recurrent, mild ([place]) 12/02/2014   Dyslipidemia 12/02/2014   Vitamin B12 deficiency 12/02/2014   Peripheral neuropathic pain 12/02/2014   Peripheral artery disease ([place]) 12/02/2014   Phantom limb ([place]) 12/02/2014   Diabetes mellitus with nephropathy ([place]) 12/02/2014   Detached retina 12/02/2014   Type 2 diabetes mellitus with peripheral neuropathy ([place]) 12/02/2014   Tobacco use 12/02/2014   Urge incontinence 12/02/2014   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   Vitamin D deficiency 12/02/2014    Past Surgical History:  Procedure Laterality Date   ABDOMINAL HYSTERECTOMY  1992   Total   EYE SURGERY     TOE AMPUTATION Left 2011   All of her  toes on the left foot   VASCULAR SURGERY      Family History  Problem Relation Age of Onset   Heart disease Father    Diabetes Mother    Diabetes Brother    Kidney disease Brother        Transplant   Heart disease Brother    Diabetes Sister    Diabetes Sister     Social History   Tobacco Use   Smoking status: Every Day    Packs/day: 0.25    Years: 40.00    Total pack years: 10.00    Types: Cigarettes    Start date: 03/16/2000   Smokeless tobacco: Never  Substance Use Topics   Alcohol use: No    Alcohol/week: 0.0 standard drinks of alcohol     Current  Outpatient Medications:    ACCU-CHEK GUIDE test strip, DISPENSE BASED ON PATIENT AND INSURANCE PREFERENCE. USE UP TO FOUR TIMES DAILY AS DIRECTED. (FOR ICD-10 E10.9, E11.9)., Disp: 100 strip, Rfl: 5   Accu-Chek Softclix Lancets lancets, See admin instructions., Disp: , Rfl:    aspirin 81 MG tablet, Take 81 mg by mouth daily., Disp: , Rfl:    atropine 1 % ophthalmic solution, Place 1 drop into the right eye 3 (three) times daily., Disp: , Rfl:    blood glucose meter kit and supplies KIT, DISPENSE BASED ON PATIENT AND INSURANCE PREFERENCE. USE UP TO FOUR TIMES DAILY AS DIRECTED. (FOR ICD-10 E10.9, E11.9)., Disp: , Rfl:    blood glucose meter kit and supplies, Dispense based on patient and insurance preference. Use up to four times daily as directed. (FOR ICD-10 E10.9, E11.9)., Disp: 1 each, Rfl: 0   brimonidine (ALPHAGAN) 0.2 % ophthalmic solution, Place 1 drop into both eyes 2 (two) times daily., Disp: , Rfl: 3   citalopram (CELEXA) 10 MG tablet, Take 1 tablet (10 mg total) by mouth daily., Disp: 90 tablet, Rfl: 1   clopidogrel (PLAVIX) 75 MG tablet, Take 1 tablet (75 mg total) by mouth daily., Disp: 90 tablet, Rfl: 1   Cyanocobalamin (B-12) 1000 MCG SUBL, Place 1 tablet under the tongue daily., Disp: 30 tablet, Rfl: 5   dapagliflozin propanediol (FARXIGA) 10 MG TABS tablet, TAKE 1 TABLET BY MOUTH EVERY DAY BEFORE BREAKFAST, Disp: 90 tablet, Rfl: 1   dorzolamide-timolol (COSOPT) 22.3-6.8 MG/ML ophthalmic solution, SMARTSIG:In Eye(s), Disp: , Rfl:    fluconazole (DIFLUCAN) 100 MG tablet, Take 1 tablet (100 mg total) by mouth every other day. Hold citalopram when taking this medication, Disp: 3 tablet, Rfl: 1   furosemide (LASIX) 40 MG tablet, Take 1 tablet (40 mg total) by mouth daily., Disp: 30 tablet, Rfl: 1   losartan (COZAAR) 100 MG tablet, Take 0.5 tablets (50 mg total) by mouth daily., Disp: 90 tablet, Rfl: 1   metoprolol succinate (TOPROL XL) 25 MG 24 hr tablet, Take 1 tablet (25 mg total) by  mouth daily. Take with or immediately following a meal., Disp: 90 tablet, Rfl: 1   Multiple Vitamin (MULTIVITAMIN) tablet, Take 1 tablet by mouth daily., Disp: , Rfl:    nitrofurantoin, macrocrystal-monohydrate, (MACROBID) 100 MG capsule, Take 1 capsule (100 mg total) by mouth 2 (two) times daily., Disp: 10 capsule, Rfl: 0   nitroGLYCERIN (NITROSTAT) 0.4 MG SL tablet, Place 1 tablet (0.4 mg total) under the tongue every 5 (five) minutes as needed for chest pain., Disp: 15 tablet, Rfl: 12   potassium chloride SA (KLOR-CON M20) 20 MEQ tablet, Take 1 tablet (20 mEq total) by  mouth 2 (two) times daily. But three times a day for the next few days and return for labs, Disp: 60 tablet, Rfl: 0   prednisoLONE acetate (PRED FORTE) 1 % ophthalmic suspension, Place into the right eye., Disp: , Rfl:    psyllium (METAMUCIL) 0.52 g capsule, Take 1 capsule (0.52 g total) by mouth daily., Disp: 90 capsule, Rfl: 3   rosuvastatin (CRESTOR) 10 MG tablet, Take 1 tablet (10 mg total) by mouth daily., Disp: 90 tablet, Rfl: 1   sitaGLIPtin (JANUVIA) 100 MG tablet, Take 1 tablet (100 mg total) by mouth daily., Disp: 90 tablet, Rfl: 1   Vibegron (GEMTESA) 75 MG TABS, Take 75 mg by mouth daily., Disp: 30 tablet, Rfl: 11   Vitamin D, Ergocalciferol, (DRISDOL) 1.25 MG (50000 UNIT) CAPS capsule, TAKE 1 CAPSULE (50,000 UNITS TOTAL) BY MOUTH EVERY 7 (SEVEN) DAYS, Disp: 8 capsule, Rfl: 1  Allergies  Allergen Reactions   Other Itching    Nucinta   Tapentadol Hcl    Actos [Pioglitazone]     History of CHF    I personally reviewed active problem list, medication list, allergies, family history, social history, health maintenance with the patient/caregiver today.   ROS  ***  Objective  There were no vitals filed for this visit.  There is no height or weight on file to calculate BMI.  Physical Exam ***  Recent Results (from the past 2160 hour(s))  Urinalysis, Complete     Status: Abnormal   Collection Time:  04/08/22  3:25 PM  Result Value Ref Range   Specific Gravity, UA 1.015 1.005 - 1.030   pH, UA 5.5 5.0 - 7.5   Color, UA Yellow Yellow   Appearance Ur Clear Clear   Leukocytes,UA Negative Negative   Protein,UA Negative Negative/Trace   Glucose, UA Negative Negative   Ketones, UA Negative Negative   RBC, UA Trace (A) Negative   Bilirubin, UA Negative Negative   Urobilinogen, Ur 0.2 0.2 - 1.0 mg/dL   Nitrite, UA Negative Negative   Microscopic Examination See below:   Microscopic Examination     Status: Abnormal   Collection Time: 04/08/22  3:25 PM   Urine  Result Value Ref Range   WBC, UA 0-5 0 - 5 /hpf   RBC, Urine 0-2 0 - 2 /hpf   Epithelial Cells (non renal) 0-10 0 - 10 /hpf   Crystals Present (A) N/A   Crystal Type Calcium Oxalate N/A   Bacteria, UA Few None seen/Few    PHQ2/9:    03/19/2022    9:09 AM 02/19/2022   10:30 AM 08/15/2021   11:08 AM 07/30/2021    1:48 PM 06/24/2021   11:36 AM  Depression screen PHQ 2/9  Decreased Interest 2 0 0 0 1  Down, Depressed, Hopeless 2 2 0 0 1  PHQ - 2 Score 4 2 0 0 2  Altered sleeping 0 0 0 0 0  Tired, decreased energy  1 0 0 0  Change in appetite 0 0 0 0 0  Feeling bad or failure about yourself  0 0 0 0 0  Trouble concentrating 1 1 0 0 0  Moving slowly or fidgety/restless 1 0 0 0 0  Suicidal thoughts 0 0 0 0 0  PHQ-9 Score 6 4 0 0 2    phq 9 is {gen pos [id number]   Fall Risk:    02/19/2022   10:30 AM 08/15/2021   11:08 AM 07/30/2021    1:41 PM 05/10/2021  12:49 PM 03/19/2021   11:19 AM  Fall Risk   Falls in the past year? 1 0 0 1 1  Number falls in past yr: 0 0 0 1 1  Injury with Fall?  0 0 0 1  Risk for fall due to : Impaired vision;History of fall(s) Impaired balance/gait Impaired balance/gait History of fall(s);Impaired balance/gait;Impaired mobility Impaired balance/gait  Follow up Education provided;Falls evaluation completed;Falls prevention discussed Falls prevention discussed Falls prevention discussed Falls  evaluation completed;Education provided;Falls prevention discussed Falls prevention discussed      Functional Status Survey:      Assessment & Plan  *** There are no diagnoses linked to this encounter.

## 2022-06-27 ENCOUNTER — Ambulatory Visit: Payer: Medicare Other | Admitting: Family Medicine

## 2022-06-27 DIAGNOSIS — E1151 Type 2 diabetes mellitus with diabetic peripheral angiopathy without gangrene: Secondary | ICD-10-CM

## 2022-07-01 ENCOUNTER — Encounter: Payer: Self-pay | Admitting: Gastroenterology

## 2022-07-01 ENCOUNTER — Ambulatory Visit (INDEPENDENT_AMBULATORY_CARE_PROVIDER_SITE_OTHER): Payer: Medicare Other | Admitting: Gastroenterology

## 2022-07-01 VITALS — BP 159/81 | HR 78 | Temp 97.7°F | Ht 63.0 in | Wt 129.2 lb

## 2022-07-01 DIAGNOSIS — R197 Diarrhea, unspecified: Secondary | ICD-10-CM | POA: Diagnosis not present

## 2022-07-01 NOTE — Patient Instructions (Signed)
Please give [place] a call if you would like to schedule a colonoscopy with Dr. Vicente Males.

## 2022-07-01 NOTE — Progress Notes (Unsigned)
Jonathon Bellows MD, MRCP(U.K) [address]  [place]  [place], [place] [address]  Main: [phone]  Fax: [phone]   Primary Care Physician: Steele Sizer, MD  Primary Gastroenterologist:  Dr. Jonathon Bellows   Chief Complaint  Patient presents with   Fecal smearing    HPI: Lisa [name] is a 77 y.o. female   Summary of history :  Initially referred and seen by Dr Bonna Gains back in 12/2020 for fecal incontinence.At that time she also had urinary incontinence and had been seen by a Urologist . Never had a prior colonoscopy . She was scheduled to have a colonoscopy and rule out microscopic colitis but the patient cancelled the procedure.   Interval history   01/22/2021-07/01/2022  She follows with urology for nocturnal enuresis, and there went a cystoscopy per patient and is not aware of the result 02/19/2022: Hb 12.6 grams  She did not recollect that she had to have a colonoscopy after the last visit she says that she has been having fecal incontinence on multiple episodes over the past 1 year stools are extremely watery and has 3-4 bowel movements per day.  Her urinary incontinence is a bit better.  Denies any NSAID use denies any use of chewing gum denies any artificial sugars weakness never had a colonoscopy.  Current Outpatient Medications  Medication Sig Dispense Refill   ACCU-CHEK GUIDE test strip DISPENSE BASED ON PATIENT AND INSURANCE PREFERENCE. USE UP TO FOUR TIMES DAILY AS DIRECTED. (FOR ICD-10 E10.9, E11.9). 100 strip 5   Accu-Chek Softclix Lancets lancets See admin instructions.     aspirin 81 MG tablet Take 81 mg by mouth daily.     atropine 1 % ophthalmic solution Place 1 drop into the right eye 3 (three) times daily.     blood glucose meter kit and supplies KIT DISPENSE BASED ON PATIENT AND INSURANCE PREFERENCE. USE UP TO FOUR TIMES DAILY AS DIRECTED. (FOR ICD-10 E10.9, E11.9).     blood glucose meter kit and supplies Dispense based on patient and insurance  preference. Use up to four times daily as directed. (FOR ICD-10 E10.9, E11.9). 1 each 0   brimonidine (ALPHAGAN) 0.2 % ophthalmic solution Place 1 drop into both eyes 2 (two) times daily.  3   citalopram (CELEXA) 10 MG tablet Take 1 tablet (10 mg total) by mouth daily. 90 tablet 1   clopidogrel (PLAVIX) 75 MG tablet Take 1 tablet (75 mg total) by mouth daily. 90 tablet 1   Cyanocobalamin (B-12) 1000 MCG SUBL Place 1 tablet under the tongue daily. 30 tablet 5   dapagliflozin propanediol (FARXIGA) 10 MG TABS tablet TAKE 1 TABLET BY MOUTH EVERY DAY BEFORE BREAKFAST 90 tablet 1   dorzolamide-timolol (COSOPT) 22.3-6.8 MG/ML ophthalmic solution SMARTSIG:In Eye(s)     fluconazole (DIFLUCAN) 100 MG tablet Take 1 tablet (100 mg total) by mouth every other day. Hold citalopram when taking this medication 3 tablet 1   furosemide (LASIX) 40 MG tablet Take 1 tablet (40 mg total) by mouth daily. 30 tablet 1   losartan (COZAAR) 100 MG tablet Take 0.5 tablets (50 mg total) by mouth daily. 90 tablet 1   metoprolol succinate (TOPROL XL) 25 MG 24 hr tablet Take 1 tablet (25 mg total) by mouth daily. Take with or immediately following a meal. 90 tablet 1   Multiple Vitamin (MULTIVITAMIN) tablet Take 1 tablet by mouth daily.     nitrofurantoin, macrocrystal-monohydrate, (MACROBID) 100 MG capsule Take 1 capsule (100 mg total) by  mouth 2 (two) times daily. 10 capsule 0   nitroGLYCERIN (NITROSTAT) 0.4 MG SL tablet Place 1 tablet (0.4 mg total) under the tongue every 5 (five) minutes as needed for chest pain. 15 tablet 12   potassium chloride SA (KLOR-CON M20) 20 MEQ tablet Take 1 tablet (20 mEq total) by mouth 2 (two) times daily. But three times a day for the next few days and return for labs 60 tablet 0   prednisoLONE acetate (PRED FORTE) 1 % ophthalmic suspension Place into the right eye.     psyllium (METAMUCIL) 0.52 g capsule Take 1 capsule (0.52 g total) by mouth daily. 90 capsule 3   rosuvastatin (CRESTOR) 10 MG  tablet Take 1 tablet (10 mg total) by mouth daily. 90 tablet 1   sitaGLIPtin (JANUVIA) 100 MG tablet Take 1 tablet (100 mg total) by mouth daily. 90 tablet 1   Vibegron (GEMTESA) 75 MG TABS Take 75 mg by mouth daily. 30 tablet 11   Vitamin D, Ergocalciferol, (DRISDOL) 1.25 MG (50000 UNIT) CAPS capsule TAKE 1 CAPSULE (50,000 UNITS TOTAL) BY MOUTH EVERY 7 (SEVEN) DAYS 8 capsule 1   No current facility-administered medications for this visit.    Allergies as of 07/01/2022 - Review Complete 07/01/2022  Allergen Reaction Noted   Other Itching 07/27/2014   Tapentadol hcl  04/07/2018   Actos [pioglitazone]  06/24/2021    ROS:  General: Negative for anorexia, weight loss, fever, chills, fatigue, weakness. ENT: Negative for hoarseness, difficulty swallowing , nasal congestion. CV: Negative for chest pain, angina, palpitations, dyspnea on exertion, peripheral edema.  Respiratory: Negative for dyspnea at rest, dyspnea on exertion, cough, sputum, wheezing.  GI: See history of present illness. GU:  Negative for dysuria, hematuria, urinary incontinence, urinary frequency, nocturnal urination.  Endo: Negative for unusual weight change.    Physical Examination:   BP (!) 180/91   Pulse 67   Temp 97.7 F (36.5 C) (Oral)   Ht 5\' 3"  (1.6 m)   Wt 129 lb 3.2 oz (58.6 kg)   BMI 22.89 kg/m   General: Well-nourished, well-developed in no acute distress.  Eyes: No icterus. Conjunctivae pink. Mouth: Oropharyngeal mucosa moist and pink , no lesions erythema or exudate. Lungs: Clear to auscultation bilaterally. Non-labored. Heart: Regular rate and rhythm, no murmurs rubs or gallops.  Abdomen: Bowel sounds are normal, nontender, nondistended, no hepatosplenomegaly or masses, no abdominal bruits or hernia , no rebound or guarding.   Extremities: No lower extremity edema. No clubbing or deformities. Neuro: Alert and oriented x 3.  Grossly intact. Skin: Warm and dry, no jaundice.   Psych: Alert and  cooperative, normal mood and affect.   Imaging Studies: No results found.  Assessment and Plan:   Lisa [name] is a 77 y.o. y/o female here to follow up for fecal incontinence.   Plan 1.  Stool tests GI PCR fecal calprotectin to evaluate for diarrhea 2.  Colonoscopy with biopsies to rule out microscopic colitis  The patient was unsure when she would like to get it done as she had a lot of appointments and was advised to contact us when she is ready to get it done.  Dr Jonathon Bellows  MD,MRCP [facility]) Follow up in as needed

## 2022-07-30 ENCOUNTER — Other Ambulatory Visit: Payer: Self-pay

## 2022-07-30 DIAGNOSIS — E1151 Type 2 diabetes mellitus with diabetic peripheral angiopathy without gangrene: Secondary | ICD-10-CM

## 2022-07-30 DIAGNOSIS — E785 Hyperlipidemia, unspecified: Secondary | ICD-10-CM

## 2022-07-30 DIAGNOSIS — I152 Hypertension secondary to endocrine disorders: Secondary | ICD-10-CM

## 2022-08-18 ENCOUNTER — Telehealth: Payer: Self-pay

## 2022-08-18 NOTE — Progress Notes (Signed)
Care Management & Coordination Services Pharmacy Team Pharmacy Assistant   Name: Lisa [name]  MRN: [id number] DOB: Dec 12, 1945  Reason for Encounter: Appointment Reminder  Contacted patient to confirm in office appointment with Junius Argyle, PharmD on 04/17/024 at 1300. Spoke with patient on 08/19/2022   Patient changed her appointment date to 09/10/2022 @ 1300.  Chart review:  Conditions to be addressed/monitored: CHF, HTN, DMII, Depression, and Peripheral Artery Disease, Dyslipidemia, Vitamin B12 Deficiency, Urge Incontinence, Vitamin D Deficiency,   Recent office visits:  02/19/2022 Steele Sizer, MD (PCP Office Visit) for Follow-up- Stopped: Pioglitazone HCl 15 mg, Changed: Losartan Potassium 100 mg daily was decreased to 50 mg daily, Lab orders placed, Referral to Urology placed, Referral to Gastroenterology placed, Patient to follow-up in 4 months  Recent consult visits:  07/01/2022 Jonathon Bellows, MD (Gastroenterology) for Fecal Smearing- No medication changes noted, Lab orders placed, BP: 159/81, No follow-up noted  03/05/2022 Hollice Espy, MD (Urology) for Initial Visit- Started: Vibegron 75 mg daily, Lab orders placed, Bladder Scan order placed, Patient to follow-up in 4 weeks  Hospital visits:  None in previous 6 months  Medications: Outpatient Encounter Medications as of 08/18/2022  Medication Sig   ACCU-CHEK GUIDE test strip DISPENSE BASED ON PATIENT AND INSURANCE PREFERENCE. USE UP TO FOUR TIMES DAILY AS DIRECTED. (FOR ICD-10 E10.9, E11.9).   Accu-Chek Softclix Lancets lancets See admin instructions.   aspirin 81 MG tablet Take 81 mg by mouth daily.   atropine 1 % ophthalmic solution Place 1 drop into the right eye 3 (three) times daily.   blood glucose meter kit and supplies KIT DISPENSE BASED ON PATIENT AND INSURANCE PREFERENCE. USE UP TO FOUR TIMES DAILY AS DIRECTED. (FOR ICD-10 E10.9, E11.9).   blood glucose meter kit and supplies Dispense based on patient and  insurance preference. Use up to four times daily as directed. (FOR ICD-10 E10.9, E11.9).   brimonidine (ALPHAGAN) 0.2 % ophthalmic solution Place 1 drop into both eyes 2 (two) times daily.   citalopram (CELEXA) 10 MG tablet Take 1 tablet (10 mg total) by mouth daily.   clopidogrel (PLAVIX) 75 MG tablet Take 1 tablet (75 mg total) by mouth daily.   Cyanocobalamin (B-12) 1000 MCG SUBL Place 1 tablet under the tongue daily.   dapagliflozin propanediol (FARXIGA) 10 MG TABS tablet TAKE 1 TABLET BY MOUTH EVERY DAY BEFORE BREAKFAST   dorzolamide-timolol (COSOPT) 22.3-6.8 MG/ML ophthalmic solution SMARTSIG:In Eye(s)   fluconazole (DIFLUCAN) 100 MG tablet Take 1 tablet (100 mg total) by mouth every other day. Hold citalopram when taking this medication   furosemide (LASIX) 40 MG tablet Take 1 tablet (40 mg total) by mouth daily.   losartan (COZAAR) 100 MG tablet Take 0.5 tablets (50 mg total) by mouth daily.   metoprolol succinate (TOPROL XL) 25 MG 24 hr tablet Take 1 tablet (25 mg total) by mouth daily. Take with or immediately following a meal.   Multiple Vitamin (MULTIVITAMIN) tablet Take 1 tablet by mouth daily.   nitrofurantoin, macrocrystal-monohydrate, (MACROBID) 100 MG capsule Take 1 capsule (100 mg total) by mouth 2 (two) times daily.   nitroGLYCERIN (NITROSTAT) 0.4 MG SL tablet Place 1 tablet (0.4 mg total) under the tongue every 5 (five) minutes as needed for chest pain.   potassium chloride SA (KLOR-CON M20) 20 MEQ tablet Take 1 tablet (20 mEq total) by mouth 2 (two) times daily. But three times a day for the next few days and return for labs   prednisoLONE acetate (PRED FORTE) 1 %  ophthalmic suspension Place into the right eye.   psyllium (METAMUCIL) 0.52 g capsule Take 1 capsule (0.52 g total) by mouth daily.   rosuvastatin (CRESTOR) 10 MG tablet Take 1 tablet (10 mg total) by mouth daily.   sitaGLIPtin (JANUVIA) 100 MG tablet Take 1 tablet (100 mg total) by mouth daily.   Vibegron (GEMTESA)  75 MG TABS Take 75 mg by mouth daily.   Vitamin D, Ergocalciferol, (DRISDOL) 1.25 MG (50000 UNIT) CAPS capsule TAKE 1 CAPSULE (50,000 UNITS TOTAL) BY MOUTH EVERY 7 (SEVEN) DAYS   No facility-administered encounter medications on file as of 08/18/2022.   Do you have any problems getting your medications? No  What is your top health concern you would like to discuss at your upcoming visit? Patient stated that she has no vision in her R eye and has to have surgery on the L eye. Patient also stated that she needs dental work but can't afford it.   Have you seen any other providers since your last visit with PCP? Yes  Star Rating Drugs:  Farxiga 10 mg last filled on 06/17/2022 for a 90-Day supply with [facility] Losartan 100 mg last filled on 02/19/2022 for a 90-Day supply with [facility] Rosuvastatin 10 mg last filled on 08/12/2022 for a 90-Day supply with [facility] Januvia 100 mg last filled on 08/12/2022 for a 90-Days supply with [facility]  Care Gaps: Annual wellness visit in last year? No  If Diabetic: Last eye exam / retinopathy screening: 03/10/2022 Last diabetic foot exam: 01/06/2022   Lynann Bologna, CPA/CMA Clinical Pharmacist Assistant Phone: [phone]

## 2022-08-20 ENCOUNTER — Other Ambulatory Visit: Payer: Self-pay | Admitting: Family Medicine

## 2022-08-20 DIAGNOSIS — I739 Peripheral vascular disease, unspecified: Secondary | ICD-10-CM

## 2022-08-20 DIAGNOSIS — E559 Vitamin D deficiency, unspecified: Secondary | ICD-10-CM

## 2022-08-23 ENCOUNTER — Other Ambulatory Visit: Payer: Self-pay | Admitting: Family Medicine

## 2022-08-23 DIAGNOSIS — I739 Peripheral vascular disease, unspecified: Secondary | ICD-10-CM

## 2022-08-23 DIAGNOSIS — E785 Hyperlipidemia, unspecified: Secondary | ICD-10-CM

## 2022-08-23 DIAGNOSIS — E1151 Type 2 diabetes mellitus with diabetic peripheral angiopathy without gangrene: Secondary | ICD-10-CM

## 2022-09-10 ENCOUNTER — Ambulatory Visit: Payer: Medicare Other

## 2022-09-10 DIAGNOSIS — E538 Deficiency of other specified B group vitamins: Secondary | ICD-10-CM

## 2022-09-10 DIAGNOSIS — F330 Major depressive disorder, recurrent, mild: Secondary | ICD-10-CM

## 2022-09-10 DIAGNOSIS — E559 Vitamin D deficiency, unspecified: Secondary | ICD-10-CM

## 2022-09-10 MED ORDER — VITAMIN D (ERGOCALCIFEROL) 1.25 MG (50000 UNIT) PO CAPS: 50000.0000 [IU] | ORAL_CAPSULE | ORAL | 1 refills | Status: DC

## 2022-09-10 MED ORDER — CITALOPRAM HYDROBROMIDE 10 MG PO TABS: 10.0000 mg | ORAL_TABLET | Freq: Every day | ORAL | 1 refills | Status: DC

## 2022-09-10 MED ORDER — B-12 1000 MCG SL SUBL
1.0000 | SUBLINGUAL_TABLET | Freq: Every day | SUBLINGUAL | 1 refills | Status: DC
Start: 2022-09-10 — End: 2024-02-02

## 2022-09-10 NOTE — Patient Instructions (Addendum)
Visit Information It was great speaking with you today!  Please let me know if you have any questions about our visit.  Print copy of patient instructions, educational materials, and care plan provided in person.  Telephone follow up appointment with pharmacy team member scheduled for: 12/10/2022 at 1:00 PM  Adelene Idler, CPA/CMA Clinical Pharmacist Assistant   Cheyenne Adas, CPP Clinical Pharmacist Practitioner  [facility]   Phone: [phone]

## 2022-09-10 NOTE — Progress Notes (Signed)
Care Management & Coordination Services Pharmacy Note  09/10/2022 Name:  Lisa [name] MRN:  [id number] DOB:  1945/08/08  Summary: Patient presents for initial pharmacy consult  Recommendations/Changes made from today's visit: Refills for citalopram, B12, and D2 sent in on behalf of patient.   Follow up plan: CPP follow-up 3 months    Subjective: Lisa [name] is an 77 y.o. year old female who is a primary patient of Alba Cory, MD.  The care coordination team was consulted for assistance with disease management and care coordination needs.    Engaged with patient face to face for initial visit.  Recent office visits: 02/19/2022 Alba Cory, MD (PCP Office Visit) for Follow-up- Stopped: Pioglitazone HCl 15 mg, Changed: Losartan Potassium 100 mg daily was decreased to 50 mg daily, Lab orders placed, Referral to Urology placed, Referral to Gastroenterology placed, Patient to follow-up in 4 months   Recent consult visits: 07/01/2022 Wyline Mood, MD (Gastroenterology) for Fecal Smearing- No medication changes noted, Lab orders placed, BP: 159/81, No follow-up noted   03/05/2022 Vanna Scotland, MD (Urology) for Initial Visit- Started: Vibegron 75 mg daily, Lab orders placed, Bladder Scan order placed, Patient to follow-up in 4 weeks  Hospital visits: None in previous 6 months   Objective:  Lab Results  Component Value Date   CREATININE 0.69 02/19/2022   BUN 9 02/19/2022   EGFR 90 02/19/2022   GFRNONAA >60 04/13/2021   GFRAA 74 02/17/2020   NA 144 02/19/2022   K 2.8 (L) 02/19/2022   CALCIUM 9.0 02/19/2022   CO2 29 02/19/2022   GLUCOSE 165 (H) 02/19/2022    Lab Results  Component Value Date/Time   HGBA1C 6.3 (A) 02/19/2022 10:32 AM   HGBA1C 6.5 (A) 06/24/2021 11:50 AM   HGBA1C 8.5 (H) 02/17/2020 02:56 PM   HGBA1C 6.5 (A) 08/16/2019 03:39 PM   HGBA1C 10.0 (H) 02/15/2019 12:00 AM   HGBA1C 7.6 (A) 10/15/2018 02:09 PM   HGBA1C 7.1 (A) 03/16/2018 02:07 PM    MICROALBUR 2.4 02/19/2022 11:14 AM   MICROALBUR 0.8 02/20/2021 02:42 PM   MICROALBUR 50 02/17/2017 02:25 PM   MICROALBUR 20 05/08/2015 02:21 PM    Last diabetic Eye exam:  Lab Results  Component Value Date/Time   HMDIABEYEEXA No Retinopathy 03/10/2022 12:00 AM    Last diabetic Foot exam:  Lab Results  Component Value Date/Time   HMDIABFOOTEX Good 01/06/2022 12:00 AM     Lab Results  Component Value Date   CHOL 144 02/19/2022   HDL 82 02/19/2022   LDLCALC 48 02/19/2022   TRIG 67 02/19/2022   CHOLHDL 1.8 02/19/2022       Latest Ref Rng & Units 02/19/2022   11:14 AM 06/24/2021   12:31 PM 02/26/2021    5:17 AM  Hepatic Function  Total Protein 6.1 - 8.1 g/dL 6.7  6.4  5.3   Albumin 3.5 - 5.0 g/dL   2.6   AST 10 - 35 U/L 22  15  31    ALT 6 - 29 U/L 18  8  17    Alk Phosphatase 38 - 126 U/L   51   Total Bilirubin 0.2 - 1.2 mg/dL 0.9  0.4  1.1     Lab Results  Component Value Date/Time   TSH 3.72 02/19/2022 11:14 AM   TSH 4.49 06/24/2021 12:31 PM   FREET4 1.30 (H) 02/26/2021 12:27 AM       Latest Ref Rng & Units 02/19/2022   11:14 AM 06/24/2021   12:31 PM 04/12/2021  8:12 AM  CBC  WBC 3.8 - 10.8 Thousand/uL 5.3  3.7  5.7   Hemoglobin 11.7 - 15.5 g/dL 40.9  81.1  9.5   Hematocrit 35.0 - 45.0 % 39.8  33.0  28.9   Platelets 140 - 400 Thousand/uL 224  223  246     Lab Results  Component Value Date/Time   VD25OH 45 02/19/2022 11:14 AM   VD25OH 51 02/20/2021 02:42 PM   VITAMINB12 472 02/19/2022 11:14 AM   VITAMINB12 306 02/20/2021 02:42 PM    Clinical ASCVD: No  The 10-year ASCVD risk score (Arnett DK, et al., 2019) is: 63.5%   Values used to calculate the score:     Age: 42 years     Sex: Female     Is Non-Hispanic African American: Yes     Diabetic: Yes     Tobacco smoker: Yes     Systolic Blood Pressure: 159 mmHg     Is BP treated: Yes     HDL Cholesterol: 82 mg/dL     Total Cholesterol: 144 mg/dL       91/47/8295    6:21 AM 02/19/2022   10:30 AM  08/15/2021   11:08 AM  Depression screen PHQ 2/9  Decreased Interest 2 0 0  Down, Depressed, Hopeless 2 2 0  PHQ - 2 Score 4 2 0  Altered sleeping 0 0 0  Tired, decreased energy  1 0  Change in appetite 0 0 0  Feeling bad or failure about yourself  0 0 0  Trouble concentrating 1 1 0  Moving slowly or fidgety/restless 1 0 0  Suicidal thoughts 0 0 0  PHQ-9 Score 6 4 0     Social History   Tobacco Use  Smoking Status Every Day   Packs/day: 0.25   Years: 40.00   Additional pack years: 0.00   Total pack years: 10.00   Types: Cigarettes   Start date: 03/16/2000  Smokeless Tobacco Never   BP Readings from Last 3 Encounters:  07/01/22 (!) 159/81  02/19/22 138/72  08/20/21 (!) 191/74   Pulse Readings from Last 3 Encounters:  07/01/22 78  08/20/21 60  08/15/21 72   Wt Readings from Last 3 Encounters:  07/01/22 129 lb 3.2 oz (58.6 kg)  04/08/22 127 lb (57.6 kg)  03/05/22 127 lb (57.6 kg)   BMI Readings from Last 3 Encounters:  07/01/22 22.89 kg/m  04/08/22 21.80 kg/m  03/05/22 21.80 kg/m    Allergies  Allergen Reactions   Other Itching    Nucinta   Tapentadol Hcl    Actos [Pioglitazone]     History of CHF    Medications Reviewed Today     Reviewed by Adela Ports, CMA (Certified Medical Assistant) on 07/01/22 at 1520  Med List Status: <None>   Medication Order Taking? Sig Documenting Provider Last Dose Status Informant  ACCU-CHEK GUIDE test strip [id number] Yes DISPENSE BASED ON PATIENT AND INSURANCE PREFERENCE. USE UP TO FOUR TIMES DAILY AS DIRECTED. (FOR ICD-10 E10.9, E11.9). Alba Cory, MD Taking Active   Accu-Chek Softclix Lancets lancets [id number] Yes See admin instructions. [provider] Taking Active   aspirin 81 MG tablet [id number] Yes Take 81 mg by mouth daily. [provider] Taking Active Self  atropine 1 % ophthalmic solution [id number] Yes Place 1 drop into the right eye 3 (three) times daily. [provider]  Taking Active Self           Med Note Laurell Josephs, [facility]  Mon Jun 24, 2021 11:36 AM)    blood glucose meter kit and supplies [id number] Yes Dispense based on patient and insurance preference. Use up to four times daily as directed. (FOR ICD-10 E10.9, E11.9). Alba Cory, MD Taking Active   blood glucose meter kit and supplies KIT [id number] Yes DISPENSE BASED ON PATIENT AND INSURANCE PREFERENCE. USE UP TO FOUR TIMES DAILY AS DIRECTED. (FOR ICD-10 E10.9, E11.9). [provider] Taking Active   brimonidine (ALPHAGAN) 0.2 % ophthalmic solution [id number] Yes Place 1 drop into both eyes 2 (two) times daily. [provider] Taking Active Self           Med Note Dollene Primrose   Mon Jun 24, 2021 11:36 AM)    citalopram (CELEXA) 10 MG tablet [id number] Yes Take 1 tablet (10 mg total) by mouth daily. Alba Cory, MD Taking Active   clopidogrel (PLAVIX) 75 MG tablet [id number] Yes Take 1 tablet (75 mg total) by mouth daily. Alba Cory, MD Taking Active   Cyanocobalamin (B-12) 1000 MCG SUBL [id number] Yes Place 1 tablet under the tongue daily. Caro Laroche, DO Taking Active Self  dapagliflozin propanediol (FARXIGA) 10 MG TABS tablet [id number] Yes TAKE 1 TABLET BY MOUTH EVERY DAY BEFORE Iona Coach, Danna Hefty, MD Taking Active   dorzolamide-timolol (COSOPT) 22.3-6.8 MG/ML ophthalmic solution [id number] Yes SMARTSIG:In Eye(s) [provider] Taking Active Self  fluconazole (DIFLUCAN) 100 MG tablet [id number] Yes Take 1 tablet (100 mg total) by mouth every other day. Hold citalopram when taking this medication Alba Cory, MD Taking Active   furosemide (LASIX) 40 MG tablet [id number] Yes Take 1 tablet (40 mg total) by mouth daily. Enedina Finner, MD Taking Active   losartan (COZAAR) 100 MG tablet [id number] Yes Take 0.5 tablets (50 mg total) by mouth daily. Alba Cory, MD Taking Active   metoprolol succinate (TOPROL XL) 25 MG 24 hr tablet [id number] Yes Take 1 tablet (25  mg total) by mouth daily. Take with or immediately following a meal. Alba Cory, MD Taking Active   Multiple Vitamin (MULTIVITAMIN) tablet [id number] Yes Take 1 tablet by mouth daily. [provider] Taking Active Self  nitrofurantoin, macrocrystal-monohydrate, (MACROBID) 100 MG capsule [id number] Yes Take 1 capsule (100 mg total) by mouth 2 (two) times daily. Alba Cory, MD Taking Active   nitroGLYCERIN (NITROSTAT) 0.4 MG SL tablet [id number] Yes Place 1 tablet (0.4 mg total) under the tongue every 5 (five) minutes as needed for chest pain. Enedina Finner, MD Taking Active   potassium chloride SA (KLOR-CON M20) 20 MEQ tablet [id number] Yes Take 1 tablet (20 mEq total) by mouth 2 (two) times daily. But three times a day for the next few days and return for labs Alba Cory, MD Taking Active   prednisoLONE acetate (PRED FORTE) 1 % ophthalmic suspension [id number] Yes Place into the right eye. [provider] Taking Active Self  psyllium (METAMUCIL) 0.52 g capsule [id number] Yes Take 1 capsule (0.52 g total) by mouth daily. Caro Laroche, DO Taking Active Self           Med Note Dollene Primrose   Mon Jun 24, 2021 11:36 AM)    rosuvastatin (CRESTOR) 10 MG tablet [id number] Yes Take 1 tablet (10 mg total) by mouth daily. Alba Cory, MD Taking Active   sitaGLIPtin (JANUVIA) 100 MG tablet [id number] Yes Take 1 tablet (100 mg total) by mouth daily. Alba Cory, MD Taking Active   Vibegron [facility]) 75 MG TABS [id number] Yes Take 75 mg by mouth daily.  Vanna Scotland, MD Taking Active   Vitamin D, Ergocalciferol, (DRISDOL) 1.25 MG (50000 UNIT) CAPS capsule [id number] Yes TAKE 1 CAPSULE (50,000 UNITS TOTAL) BY MOUTH EVERY 7 (SEVEN) DAYS Alba Cory, MD Taking Active             SDOH:  (Social Determinants of Health) assessments and interventions performed: Yes SDOH Interventions    Flowsheet Row Care Coordination from 03/19/2022 in [facility]  Care Coordination Chronic Care Management from 03/21/2021 in [facility] Office Visit from 11/30/2019 in [facility] Clinical Support from 07/20/2018 in [facility] Office Visit from 07/09/2017 in [facility]  SDOH Interventions       Food Insecurity Interventions Intervention Not Indicated [id number] Referral -- -- --  Housing Interventions Intervention Not Indicated -- -- -- --  Transportation Interventions Intervention Not Indicated Other (Comment)  [Brother is currently assisting/Will notify the team if transportation is required.] -- -- --  Depression Interventions/Treatment  Counseling -- Medication Medication, Currently on Treatment Medication  Financial Strain Interventions -- [id number] Referral -- -- --  Social Connections Interventions Intervention Not Indicated -- -- -- --       Medication Assistance: None required.  Patient affirms current coverage meets needs.  Medication Access: Within the past 30 days, how often has patient missed a dose of medication? None  Is a pillbox or other method used to improve adherence? Yes  Factors that may affect medication adherence? no barriers identified Are meds synced by current pharmacy? No  Are meds delivered by current pharmacy? No  Does patient experience delays in picking up medications due to transportation concerns? No   [facility] Reviewed: Is patient disadvantaged to use [facility]?: Yes  Current Rx insurance plan: Mickle Mallory  [phone] [place], [place] - [address] [place] [address] Phone: [phone] Fax: [phone]  [facility] services reviewed with patient today?: Yes  Patient requests to transfer care to [facility]?: No  Reason patient declined to change pharmacies: Disadvantaged due to insurance/mail  order  Compliance/Adherence/Medication fill history: Care Gaps: Annual wellness visit in last year? No   If Diabetic: Last eye exam / retinopathy screening: 03/10/2022 Last diabetic foot exam: 01/06/2022  Star-Rating Drugs: Farxiga 10 mg last filled on 06/17/2022 for a 90-Day supply with [facility] Losartan 100 mg last filled on 02/19/2022 for a 90-Day supply with [facility] Rosuvastatin 10 mg last filled on 08/12/2022 for a 90-Day supply with [facility] Januvia 100 mg last filled on 08/12/2022 for a 90-Days supply with [facility]   Assessment/Plan  Heart Failure (Goal: manage symptoms and prevent exacerbations) -Controlled -Last ejection fraction: 60-65% (Date: Oct 2022) -HF type: Diastolic -NYHA Class: II (slight limitation of activity) -AHA HF Stage: C (Heart disease and symptoms present) -Current treatment: Farxiga 10 mg daily  Furosemide 40 mg daily  Metoprolol XL 25 mg daily  -Medications previously tried: NA  -Recommended to continue current medication  Hyperlipidemia: (LDL goal < 70) -Controlled -History of PAD  -Current treatment: Rosuvastatin 10 mg daily  -Current treatment: Aspirin 81 mg daily Clopidogrel 75 mg daily   -Medications previously tried: NA  -Recommended to continue current medication  Diabetes (A1c goal <7%) -Controlled -Current medications: Farxiga 10 mg daily  Januvia 100 mg daily  -Medications previously tried: NA  -Denies hypoglycemic/hyperglycemic symptoms -Recommended to continue current medication  Depression/Anxiety (  Goal: Achieve symptom remission) -Controlled -Current treatment: Citalopram 10 mg daily  -Medications previously tried/failed: NA -Recommended to continue current medication  Follow Up Plan: Telephone follow up appointment with care management team member scheduled for: 12/10/2022 at 1:00 PM  Cheyenne Adas, CPP Clinical Pharmacist Practitioner  [facility] [phone]

## 2022-09-22 IMAGING — MR MR HEAD W/O CM
10 series · 48 of 48 positions shown · non-contrast
Comparison: Head CT 02/25/2021.

CLINICAL DATA: 75-year-old female status post syncope and fall.
Left leg weakness. Altered mental status.

EXAM:
MRI HEAD WITHOUT CONTRAST
TECHNIQUE: Multiplanar, multiecho pulse sequences of the brain and surrounding
structures were obtained without intravenous contrast.

[Series 2: ax dwi_tracew · axial · 3.0mm · 1.31mm/px · z∈[-2,+151]mm · 11 of 96 slices shown]
[im 1/96]
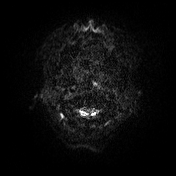
[im 10/96]
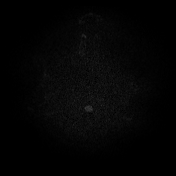
[im 20/96]
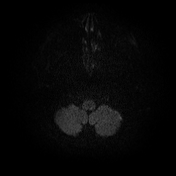
[im 29/96]
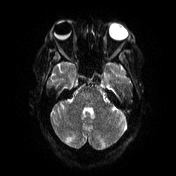
[im 39/96]
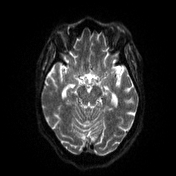
[im 48/96]
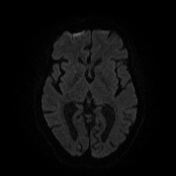
[im 58/96]
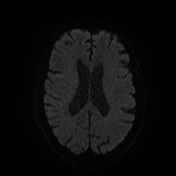
[im 67/96]
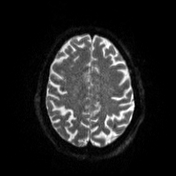
[im 77/96]
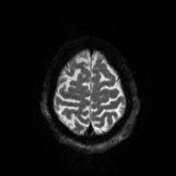
[im 86/96]
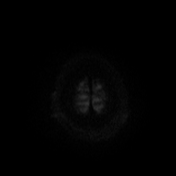
[im 96/96]
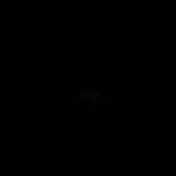

[Series 3: ax dwi_adc · axial · 3.0mm · 1.31mm/px · z∈[-2,+151]mm · 5 of 48 slices shown]
[im 1/48]
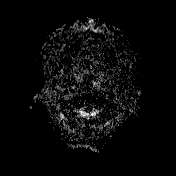
[im 12/48]
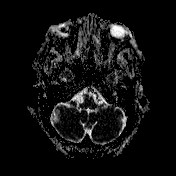
[im 24/48]
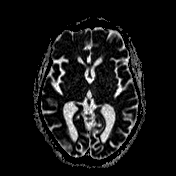
[im 36/48]
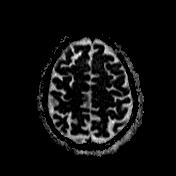
[im 48/48]
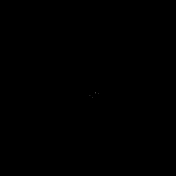

[Series 4: cor dwi_tracew · coronal · 5.0mm · 1.31mm/px · 9 of 76 slices shown]
[im 1/76]
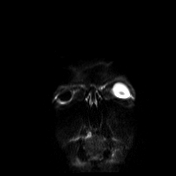
[im 10/76]
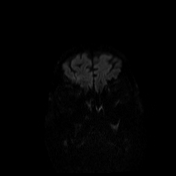
[im 19/76]
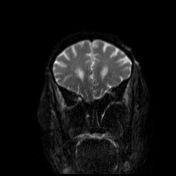
[im 29/76]
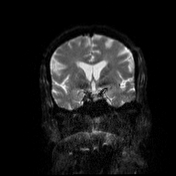
[im 38/76]
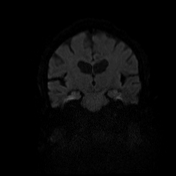
[im 47/76]
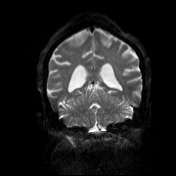
[im 57/76]
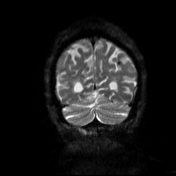
[im 66/76]
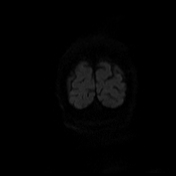
[im 76/76]
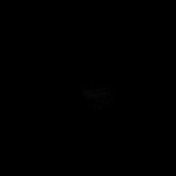

[Series 5: cor dwi_adc · coronal · 5.0mm · 1.31mm/px · 4 of 38 slices shown]
[im 1/38]
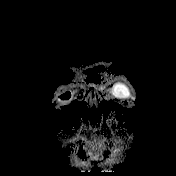
[im 13/38]
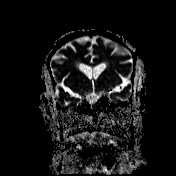
[im 25/38]
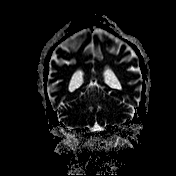
[im 38/38]
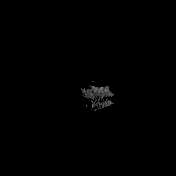

[Series 6: T1 · sagittal · 5.0mm · 0.94mm/px · 3 of 23 slices shown (1 of 2)]
[im 1/23]
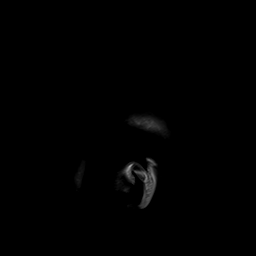
[im 12/23]
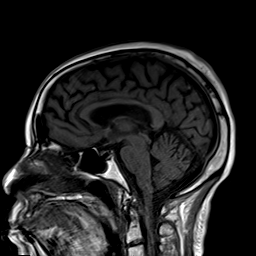
[im 23/23]
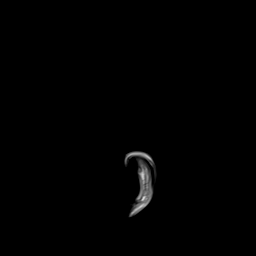

[Series 7: T2 · axial · 5.0mm · 0.45mm/px · z∈[-23,+130]mm · 3 of 27 slices shown (1 of 2)]
[im 1/27]
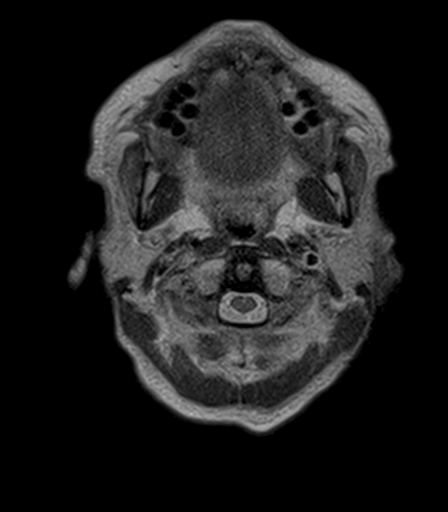
[im 14/27]
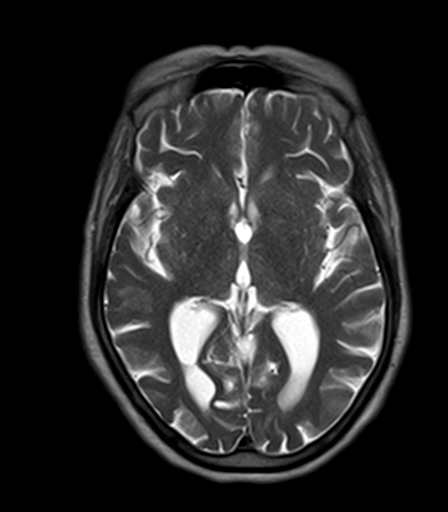
[im 27/27]
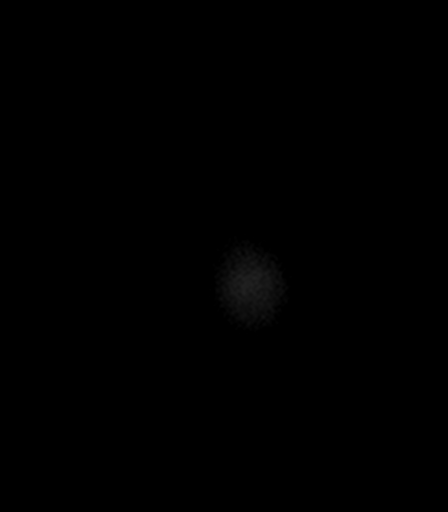

[Series 8: T2-star · axial · 5.0mm · 0.45mm/px · z∈[-23,+130]mm · 3 of 27 slices shown]
[im 1/27]
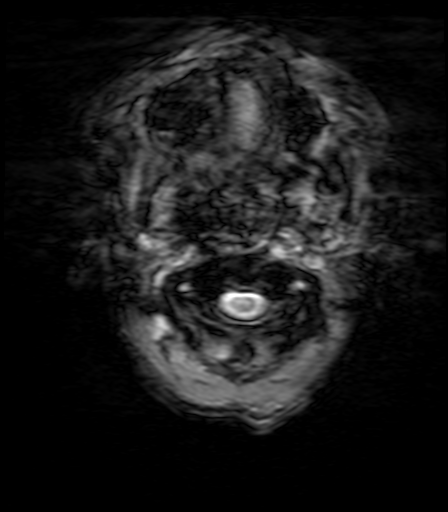
[im 14/27]
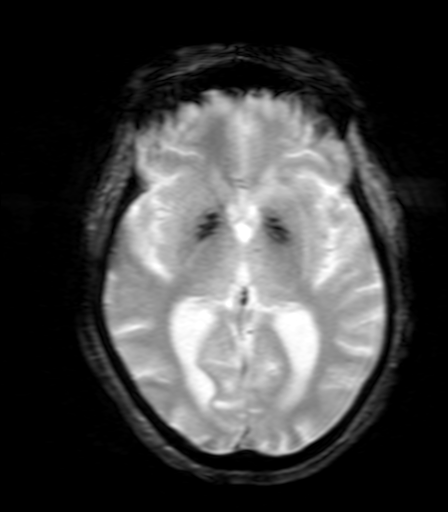
[im 27/27]
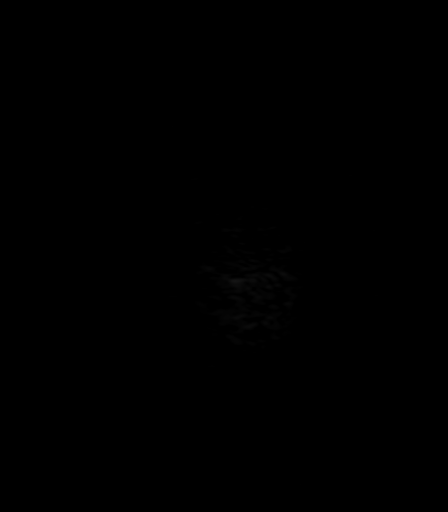

[Series 9: FLAIR · axial · 5.0mm · 1.20mm/px · z∈[-21,+132]mm · 3 of 27 slices shown]
[im 1/27]
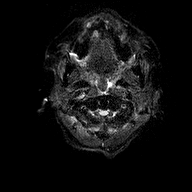
[im 14/27]
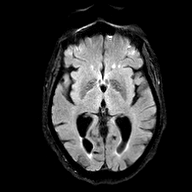
[im 27/27]
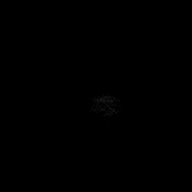

[Series 10: T1 · axial · 5.0mm · 0.90mm/px · z∈[-23,+130]mm · 3 of 27 slices shown (2 of 2)]
[im 1/27]
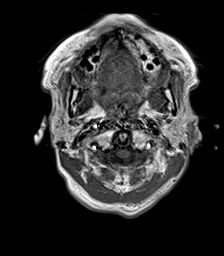
[im 14/27]
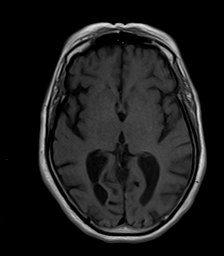
[im 27/27]
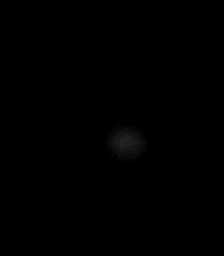

[Series 11: T2 · coronal · 5.0mm · 0.45mm/px · 4 of 31 slices shown (2 of 2)]
[im 1/31]
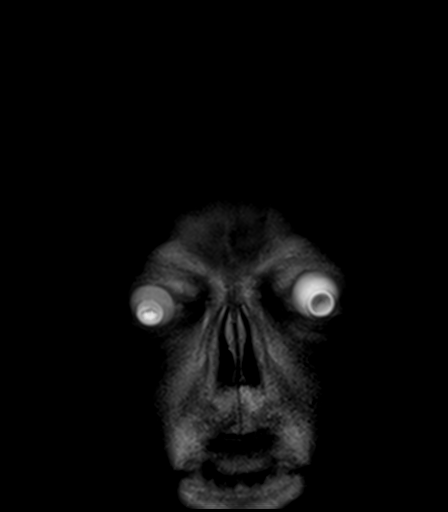
[im 11/31]
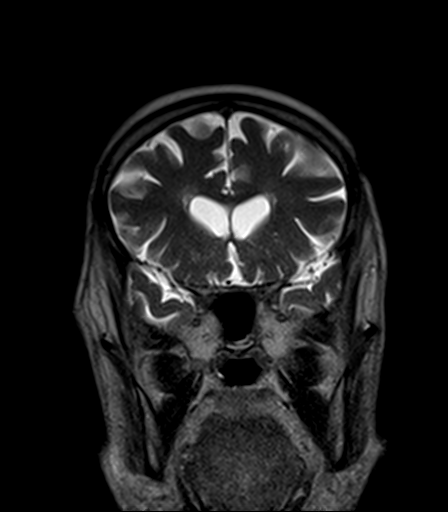
[im 21/31]
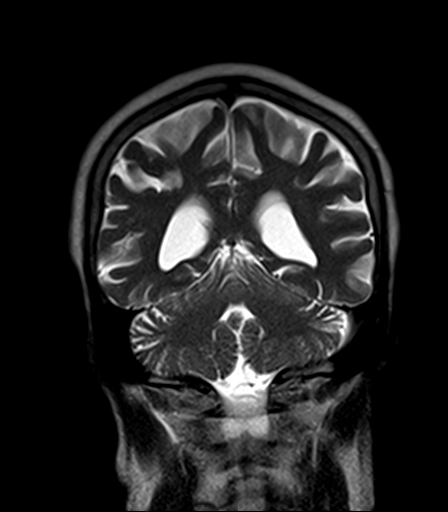
[im 31/31]
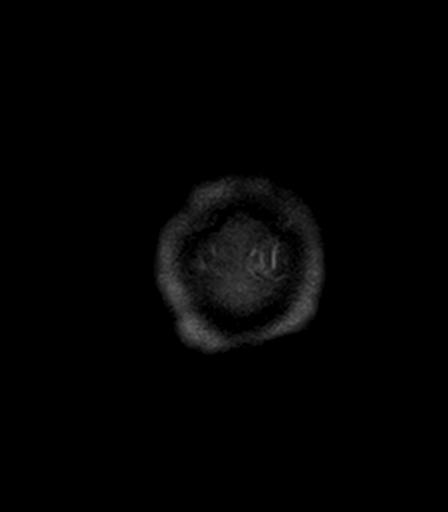

[48 of 48 positions shown; findings below may reference images not displayed]

FINDINGS: Brain: No restricted diffusion to suggest acute infarction. No
midline shift, mass effect, evidence of mass lesion,
ventriculomegaly, extra-axial collection or acute intracranial
hemorrhage. Cervicomedullary junction and pituitary are within
normal limits.

Chronic microhemorrhage in the left parietal lobe with mild DWI
susceptibility (series 8, image 18). No cortical encephalomalacia
identified. No other chronic cerebral blood products identified.
Minimal to mild for age nonspecific mostly periventricular cerebral
white matter T2 and FLAIR hyperintensity. Patchy mild to moderate T2
heterogeneity in the pons. Deep gray matter nuclei and cerebellum
appear normal for age.

Vascular: Major intracranial vascular flow voids are preserved.
Tortuous cavernous ICAs.

Skull and upper cervical spine: Negative aside from degenerative
ligamentous hypertrophy about the odontoid. Normal visible bone
marrow signal.

Sinuses/Orbits: Postoperative changes to the right globe. Otherwise
negative orbits. Paranasal sinuses and mastoids are stable and well
aerated.

Other: Grossly normal visible internal auditory structures. Negative
visible scalp and face.
IMPRESSION: 1. No acute intracranial abnormality.
2. Mild to moderate for age nonspecific signal changes in the brain,
including a solitary chronic microhemorrhage in the left parietal
lobe. Favor chronic small vessel disease.

## 2022-09-24 IMAGING — US US EXTREM  UP VENOUS*R*
1 series · 13 of 24 positions shown · non-contrast
Comparison: None.

CLINICAL DATA: Edema near IV site, diabetes, history of tobacco
abuse



[Series 1: us venous img upper uni right (dvt) · portal-venous · 13 of 38 slices shown]
[im 1/38]
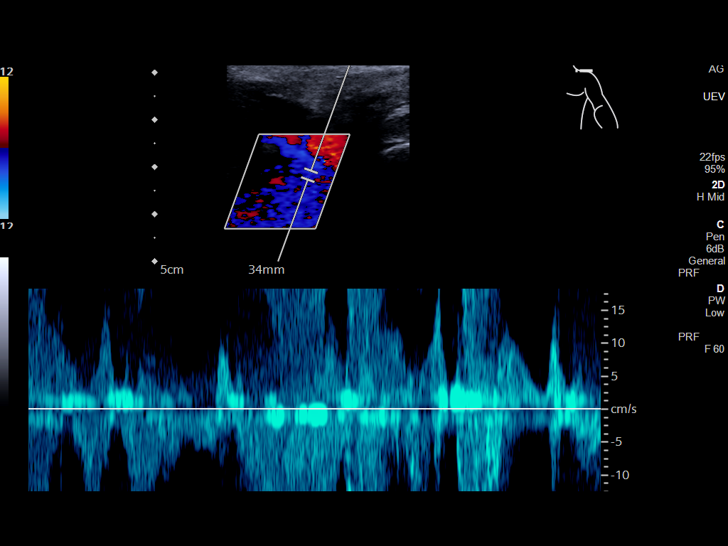
[im 4/38]
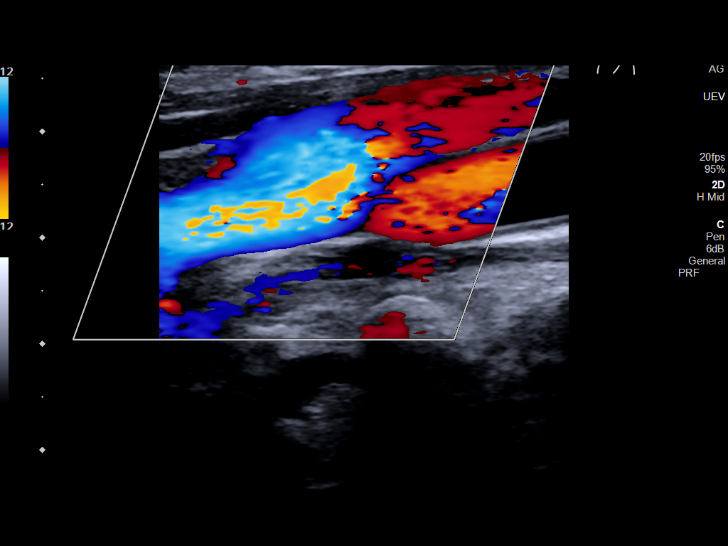
[im 7/38]
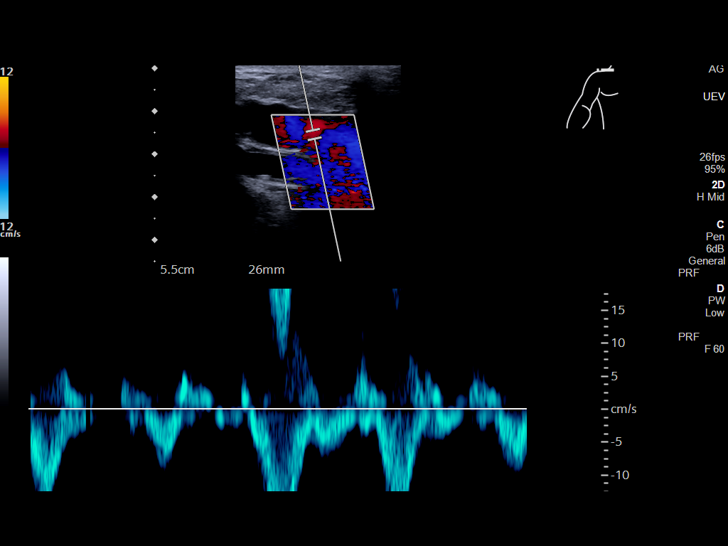
[im 10/38]
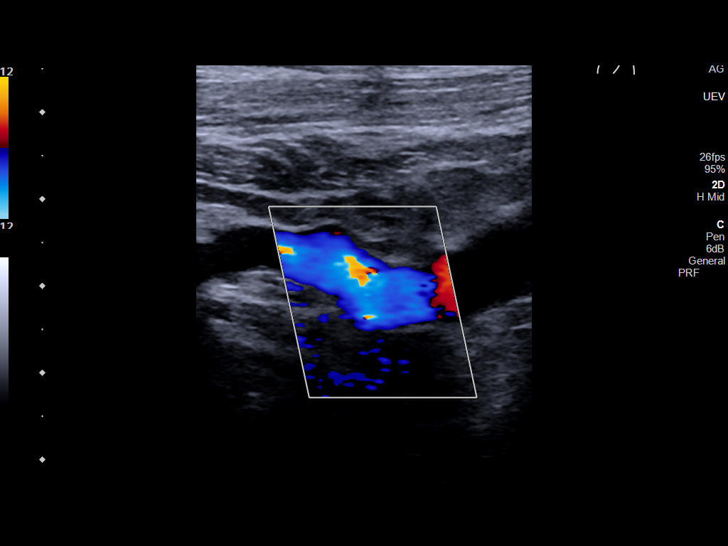
[im 13/38]
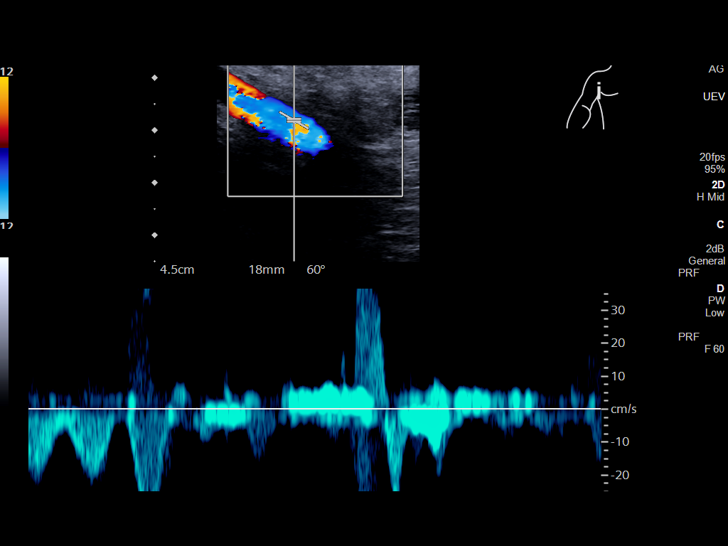
[im 17/38]
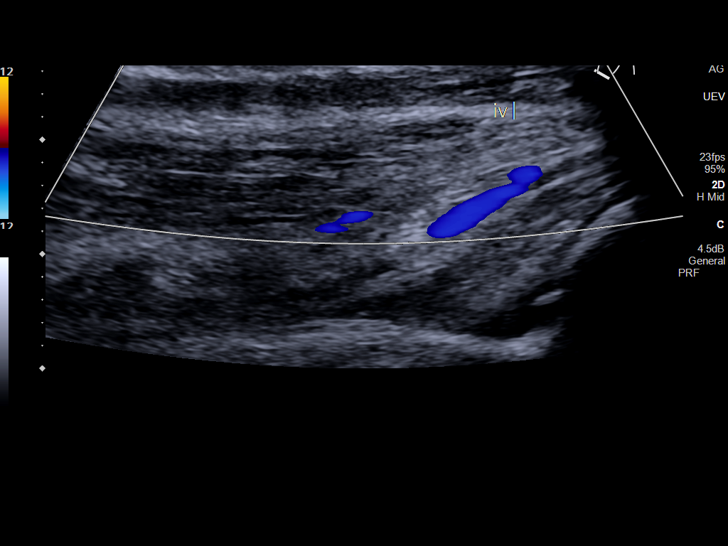
[im 20/38]
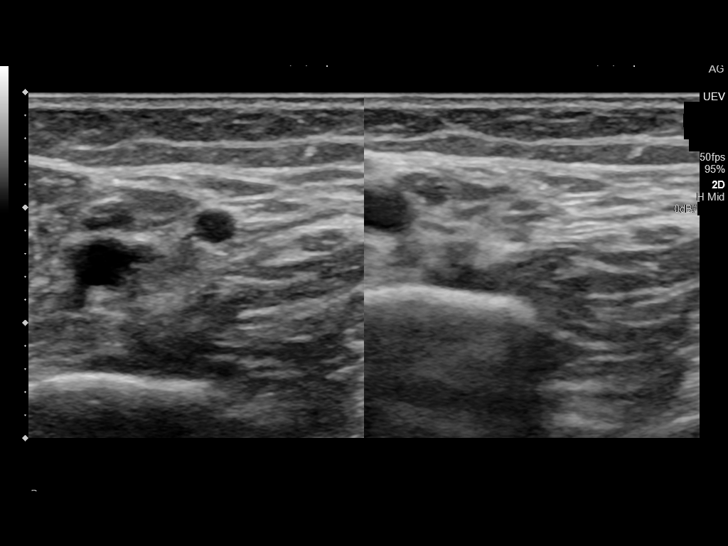
[im 21/38]
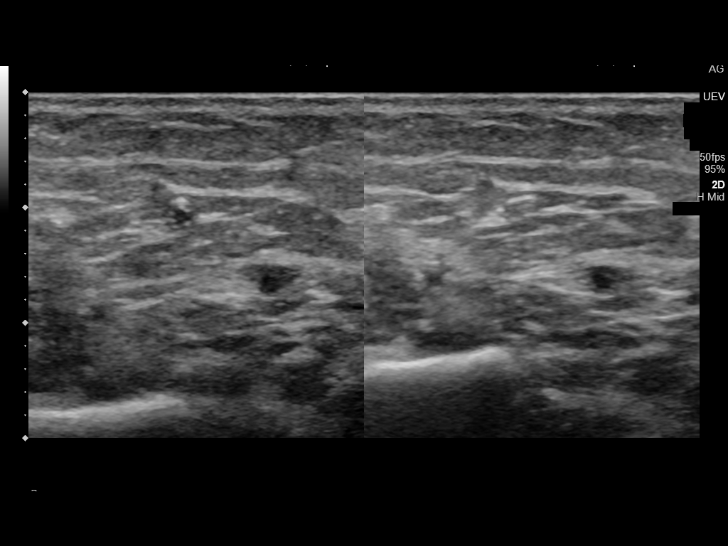
[im 25/38]
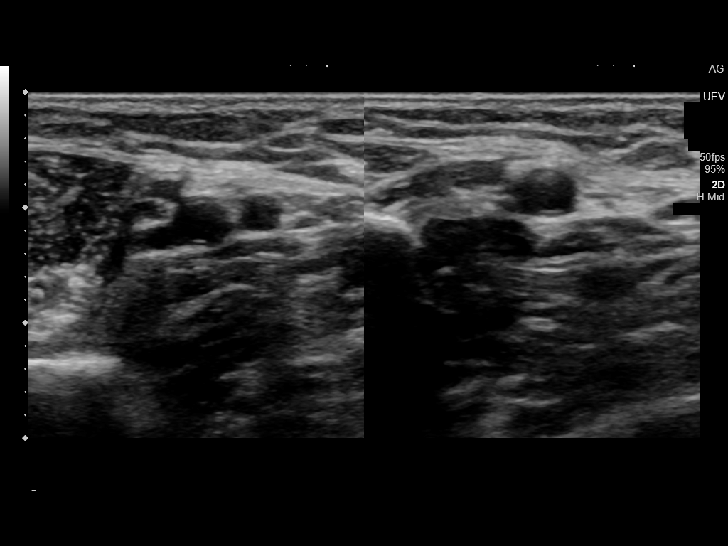
[im 28/38]
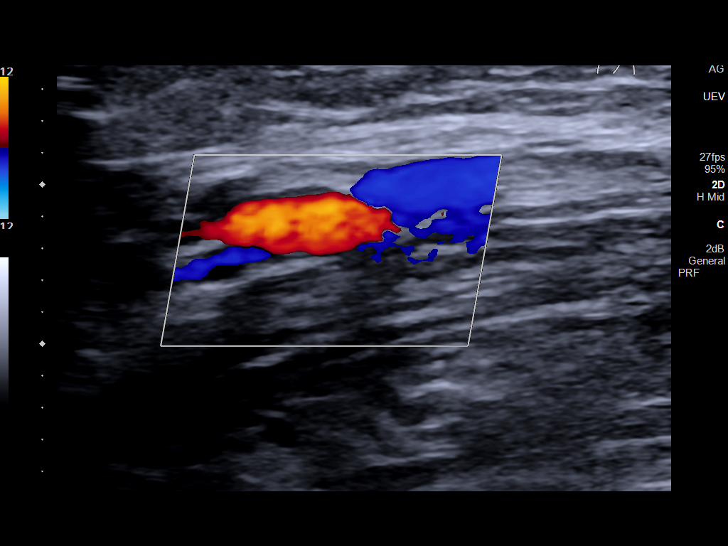
[im 31/38]
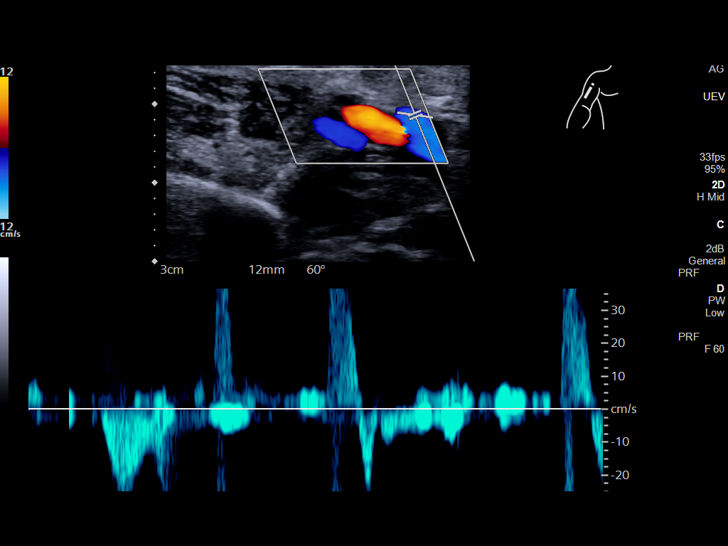
[im 34/38]
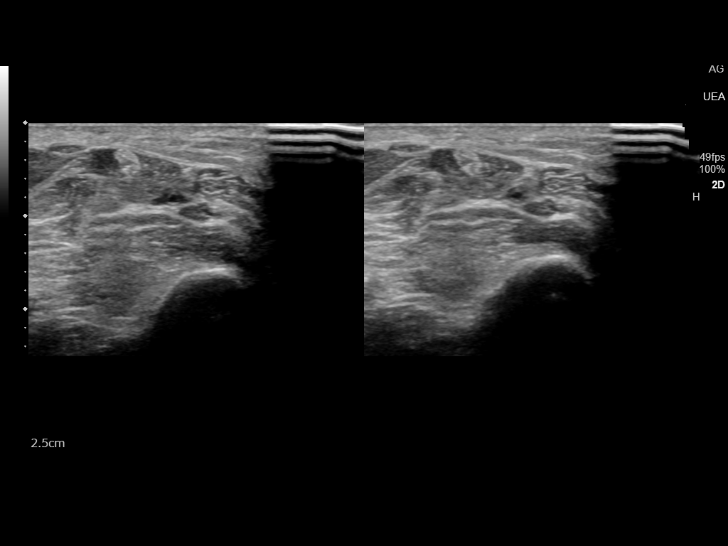
[im 38/38]
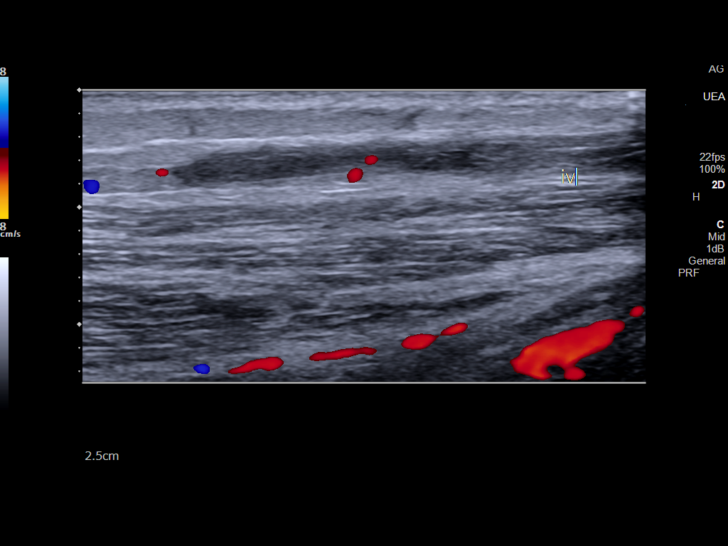

[13 of 24 positions shown; findings below may reference images not displayed]

FINDINGS: Contralateral Subclavian Vein: Respiratory phasicity is normal and
symmetric with the symptomatic side. No evidence of thrombus. Normal
compressibility.

Internal Jugular Vein: No evidence of thrombus. Normal
compressibility, respiratory phasicity and response to augmentation.

Subclavian Vein: No evidence of thrombus. Normal compressibility,
respiratory phasicity and response to augmentation.

Axillary Vein: No evidence of thrombus. Normal compressibility,
respiratory phasicity and response to augmentation.

Cephalic Vein: Segmental thrombosis spanning approximately 3 cm in
the distal segment, patent more centrally.

Basilic Vein: No evidence of thrombus. Normal compressibility,
respiratory phasicity and response to augmentation.

Brachial Veins: No evidence of thrombus. Normal compressibility,
respiratory phasicity and response to augmentation.

Radial Veins: No evidence of thrombus. Normal compressibility,
respiratory phasicity and response to augmentation.

Ulnar Veins: No evidence of thrombus. Normal compressibility,
respiratory phasicity and response to augmentation.

Venous Reflux:  None visualized.

Other Findings:  None visualized.
IMPRESSION: 1. Negative for right upper extremity DVT.
2. Segmental superficial thrombophlebitis involving the distal
cephalic vein near the IV site.

## 2022-09-26 NOTE — Progress Notes (Addendum)
Name: Lisa [name]   MRN: [id number]    DOB: 1945/12/03   Date:09/29/2022       Progress Note  Subjective  Chief Complaint  Follow Up  HPI  DMII: She is taking Januvia 100 mg, Marcelline Deist and  is now off Actos. A1C went from 6.5 % to 7.3 % but still at goal for her  . She denies polyphagia, polydipsia but has polyuria . She has PAD and HTN. She is on ARB, sees podiatrist and vascular surgeo.   Malnutrition/Diarrhea : she was seen by Dr. Maximino Greenland int he past for weight loss ,diarrhea and incontinence but she did not go back for further evaluation, weight is stable but bowel movements still frequent 3-4 x per day , no longer wanting to leave the house due to fecal smearing and diarrhea. She was seen by Dr. Tobi Bastos and he advised to have labs/stool study and to have colonoscopy but she is not ready to have it done, she states currently more worried about her vision. She lost another pounds sine January   MDD  she is feeling worse, lack of motivation, worries about her decrease in vision, she is still  having diarrhea, also has urinary frequency and urgency, not leaving her house. She is getting more isolated because she does not feel comfortable leaving her house. She is ready to see a psychiatrist . Currently taking celexa. She is also still frustrated about brother that moved in with him  HTN/CHF: she was admitted in Nov 2022 , she is on Losartan and Metoprolol, she is off Norvasc, taking furosemide in am's She denies side effects of medications. No chest pain, SOB is stable with activity.  She takes furosemide prn. She is off Actos and is now on SGL2 agonist    Nocturnal enuresis: she states Gemesa given by urologist helps symptoms slightly    PAD: she was recently seen by Dr. Wyn Quaker, 01/2021 and ABI was 0.67  on the left  and 1.1 on right.  History of left distal foot amputation, she has phantom pain , she has pain on both legs from PAD. She also has a history of neuropathy and states staggers when  walking. She is taking Crestor and Plavix    Low vitamin D and B12: taking supplementation ,reviewed last las    Patient Active Problem List   Diagnosis Date Noted   Unilateral amputation of left foot (HCC) 02/19/2022   Hypertension associated with type 2 diabetes mellitus (HCC) 02/19/2022   Hypertensive emergency 04/09/2021   Chronic diastolic heart failure (HCC) 04/09/2021   Nicotine dependence 04/09/2021   Bruising 02/26/2021   Syncope and collapse 02/26/2021   Prolonged QT interval 02/26/2021   Uncontrolled type 2 diabetes with eye complications 01/29/2016   DM (diabetes mellitus), type 2 with peripheral vascular complications (HCC) 09/25/2015   Amputation of left foot (HCC) 12/02/2014   Benign essential HTN 12/02/2014   Depression, major, recurrent, mild (HCC) 12/02/2014   Dyslipidemia 12/02/2014   Vitamin B12 deficiency 12/02/2014   Peripheral neuropathic pain 12/02/2014   Peripheral artery disease (HCC) 12/02/2014   Phantom limb (HCC) 12/02/2014   Diabetes mellitus with nephropathy (HCC) 12/02/2014   Detached retina 12/02/2014   Type 2 diabetes mellitus with peripheral neuropathy (HCC) 12/02/2014   Tobacco use 12/02/2014   Urge incontinence 12/02/2014   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   Vitamin D deficiency 12/02/2014    Past Surgical History:  Procedure Laterality Date   ABDOMINAL HYSTERECTOMY  1992   Total  EYE SURGERY     TOE AMPUTATION Left 2011   All of her toes on the left foot   VASCULAR SURGERY      Family History  Problem Relation Age of Onset   Heart disease Father    Diabetes Mother    Diabetes Brother    Kidney disease Brother        Transplant   Heart disease Brother    Diabetes Sister    Diabetes Sister     Social History   Tobacco Use   Smoking status: Every Day    Packs/day: 0.25    Years: 40.00    Additional pack years: 0.00    Total pack years: 10.00    Types: Cigarettes    Start date: 03/16/2000   Smokeless tobacco: Never   Substance Use Topics   Alcohol use: No    Alcohol/week: 0.0 standard drinks of alcohol     Current Outpatient Medications:    ACCU-CHEK GUIDE test strip, DISPENSE BASED ON PATIENT AND INSURANCE PREFERENCE. USE UP TO FOUR TIMES DAILY AS DIRECTED. (FOR ICD-10 E10.9, E11.9)., Disp: 100 strip, Rfl: 5   Accu-Chek Softclix Lancets lancets, See admin instructions., Disp: , Rfl:    aspirin 81 MG tablet, Take 81 mg by mouth daily., Disp: , Rfl:    atropine 1 % ophthalmic solution, Place 1 drop into the right eye 3 (three) times daily., Disp: , Rfl:    blood glucose meter kit and supplies KIT, DISPENSE BASED ON PATIENT AND INSURANCE PREFERENCE. USE UP TO FOUR TIMES DAILY AS DIRECTED. (FOR ICD-10 E10.9, E11.9)., Disp: , Rfl:    blood glucose meter kit and supplies, Dispense based on patient and insurance preference. Use up to four times daily as directed. (FOR ICD-10 E10.9, E11.9)., Disp: 1 each, Rfl: 0   brimonidine (ALPHAGAN) 0.2 % ophthalmic solution, Place 1 drop into both eyes 2 (two) times daily., Disp: , Rfl: 3   citalopram (CELEXA) 10 MG tablet, Take 1 tablet (10 mg total) by mouth daily., Disp: 90 tablet, Rfl: 1   clopidogrel (PLAVIX) 75 MG tablet, TAKE 1 TABLET BY MOUTH EVERY DAY, Disp: 90 tablet, Rfl: 1   Cyanocobalamin (B-12) 1000 MCG SUBL, Place 1 tablet under the tongue daily., Disp: 90 tablet, Rfl: 1   dapagliflozin propanediol (FARXIGA) 10 MG TABS tablet, TAKE 1 TABLET BY MOUTH EVERY DAY BEFORE BREAKFAST, Disp: 90 tablet, Rfl: 1   dorzolamide-timolol (COSOPT) 22.3-6.8 MG/ML ophthalmic solution, SMARTSIG:In Eye(s), Disp: , Rfl:    furosemide (LASIX) 40 MG tablet, Take 1 tablet (40 mg total) by mouth daily., Disp: 30 tablet, Rfl: 1   losartan (COZAAR) 100 MG tablet, Take 0.5 tablets (50 mg total) by mouth daily., Disp: 90 tablet, Rfl: 1   metoprolol succinate (TOPROL XL) 25 MG 24 hr tablet, Take 1 tablet (25 mg total) by mouth daily. Take with or immediately following a meal., Disp: 90  tablet, Rfl: 1   Multiple Vitamin (MULTIVITAMIN) tablet, Take 1 tablet by mouth daily., Disp: , Rfl:    nitroGLYCERIN (NITROSTAT) 0.4 MG SL tablet, Place 1 tablet (0.4 mg total) under the tongue every 5 (five) minutes as needed for chest pain., Disp: 15 tablet, Rfl: 12   potassium chloride SA (KLOR-CON M20) 20 MEQ tablet, Take 1 tablet (20 mEq total) by mouth 2 (two) times daily. But three times a day for the next few days and return for labs, Disp: 60 tablet, Rfl: 0   prednisoLONE acetate (PRED FORTE) 1 % ophthalmic suspension,  Place into the right eye., Disp: , Rfl:    psyllium (METAMUCIL) 0.52 g capsule, Take 1 capsule (0.52 g total) by mouth daily., Disp: 90 capsule, Rfl: 3   rosuvastatin (CRESTOR) 10 MG tablet, TAKE 1 TABLET BY MOUTH EVERY DAY, Disp: 90 tablet, Rfl: 0   sitaGLIPtin (JANUVIA) 100 MG tablet, TAKE 1 TABLET BY MOUTH EVERY DAY, Disp: 90 tablet, Rfl: 0   Vibegron (GEMTESA) 75 MG TABS, Take 75 mg by mouth daily., Disp: 30 tablet, Rfl: 11   Vitamin D, Ergocalciferol, (DRISDOL) 1.25 MG (50000 UNIT) CAPS capsule, Take 1 capsule (50,000 Units total) by mouth every 7 (seven) days., Disp: 12 capsule, Rfl: 1   fluconazole (DIFLUCAN) 100 MG tablet, Take 1 tablet (100 mg total) by mouth every other day. Hold citalopram when taking this medication (Patient not taking: Reported on 09/29/2022), Disp: 3 tablet, Rfl: 1   nitrofurantoin, macrocrystal-monohydrate, (MACROBID) 100 MG capsule, Take 1 capsule (100 mg total) by mouth 2 (two) times daily. (Patient not taking: Reported on 09/29/2022), Disp: 10 capsule, Rfl: 0  Allergies  Allergen Reactions   Other Itching    Nucinta   Tapentadol Hcl    Actos [Pioglitazone]     History of CHF    I personally reviewed active problem list, medication list, allergies, family history, social history, health maintenance with the patient/caregiver today.   ROS  Constitutional: Negative for fever , positive for  weight change.  Respiratory: Negative for  cough and shortness of breath.   Cardiovascular: Negative for chest pain or palpitations.  Gastrointestinal: Negative for abdominal pain, no bowel changes.  Musculoskeletal: positive for gait problem but no  joint swelling.  Skin: Negative for rash.  Neurological: Negative for dizziness or headache.  No other specific complaints in a complete review of systems (except as listed in HPI above).   Objective  Vitals:   09/29/22 1350 09/29/22 1357  BP: (!) 160/72 (!) 162/78  Pulse: 78   Resp: 16   SpO2: 93%   Weight: 125 lb (56.7 kg)   Height: 5\' 4"  (1.626 m)     Body mass index is 21.46 kg/m.  Physical Exam  Constitutional: Patient appears well-developed and malnourished No distress.  HEENT: head atraumatic, normocephalic, pupils equal and reactive to light, neck supple Cardiovascular: Normal rate, regular rhythm and normal heart sounds.  No murmur heard. No BLE edema. Pulmonary/Chest: Effort normal and breath sounds normal. No respiratory distress. Abdominal: Soft.  There is no tenderness. Psychiatric: Patient has a depressed mood , behavior is normal. Judgment and thought content normal.   Recent Results (from the past 2160 hour(s))  POCT HgB A1C     Status: Abnormal   Collection Time: 09/29/22  1:54 PM  Result Value Ref Range   Hemoglobin A1C 7.3 (A) 4.0 - 5.6 %   HbA1c POC (<> result, manual entry)     HbA1c, POC (prediabetic range)     HbA1c, POC (controlled diabetic range)       PHQ2/9:    09/29/2022    1:53 PM 03/19/2022    9:09 AM 02/19/2022   10:30 AM 08/15/2021   11:08 AM 07/30/2021    1:48 PM  Depression screen PHQ 2/9  Decreased Interest 1 2 0 0 0  Down, Depressed, Hopeless 1 2 2  0 0  PHQ - 2 Score 2 4 2  0 0  Altered sleeping 0 0 0 0 0  Tired, decreased energy 3  1 0 0  Change in appetite 0 0 0  0 0  Feeling bad or failure about yourself  0 0 0 0 0  Trouble concentrating 0 1 1 0 0  Moving slowly or fidgety/restless 0 1 0 0 0  Suicidal thoughts 0 0 0 0 0   PHQ-9 Score 5 6 4  0 0    phq 9 is positive   Fall Risk:    09/29/2022    1:52 PM 02/19/2022   10:30 AM 08/15/2021   11:08 AM 07/30/2021    1:41 PM 05/10/2021   12:49 PM  Fall Risk   Falls in the past year? 0 1 0 0 1  Number falls in past yr: 0 0 0 0 1  Injury with Fall? 0  0 0 0  Risk for fall due to : No Fall Risks Impaired vision;History of fall(s) Impaired balance/gait Impaired balance/gait History of fall(s);Impaired balance/gait;Impaired mobility  Follow up Falls prevention discussed Education provided;Falls evaluation completed;Falls prevention discussed Falls prevention discussed Falls prevention discussed Falls evaluation completed;Education provided;Falls prevention discussed      Functional Status Survey: Is the patient deaf or have difficulty hearing?: No Does the patient have difficulty seeing, even when wearing glasses/contacts?: Yes Does the patient have difficulty concentrating, remembering, or making decisions?: No Does the patient have difficulty walking or climbing stairs?: Yes Does the patient have difficulty dressing or bathing?: No Does the patient have difficulty doing errands alone such as visiting a doctor's office or shopping?: No    Assessment & Plan  1. Hypertension associated with type 2 diabetes mellitus (HCC)  - POCT HgB A1C - losartan (COZAAR) 50 MG tablet; Take 1 tablet (50 mg total) by mouth daily.  Dispense: 90 tablet; Refill: 1 - metoprolol succinate (TOPROL XL) 25 MG 24 hr tablet; Take 1 tablet (25 mg total) by mouth daily. Take with or immediately following a meal.  Dispense: 90 tablet; Refill: 1  2. Peripheral artery disease (HCC)  - rosuvastatin (CRESTOR) 10 MG tablet; Take 1 tablet (10 mg total) by mouth daily.  Dispense: 90 tablet; Refill: 1  3. DM (diabetes mellitus), type 2 with peripheral vascular complications (HCC)  - sitaGLIPtin (JANUVIA) 100 MG tablet; Take 1 tablet (100 mg total) by mouth daily.  Dispense: 90 tablet; Refill:  1  4. Phantom limb (HCC)  Stable   5. Amputation of left foot, subsequent encounter (HCC)  Stable   6. Chronic diastolic heart failure (HCC)  Under the care of cardiologist   7. Depression, major, recurrent, mild (HCC)  - Ambulatory referral to Psychiatry  8. Moderate protein-calorie malnutrition (HCC)  She is now eating breakfast, explained she needs to see GI and follow through with the labs / tests   9. Dyslipidemia  - rosuvastatin (CRESTOR) 10 MG tablet; Take 1 tablet (10 mg total) by mouth daily.  Dispense: 90 tablet; Refill: 1  10. Vitamin D deficiency  Continue supplementation  11. Vitamin B12 deficiency   13. Legally blind in right eye, as defined in [place]  She needs to follow up with eye doctor, needs surgery on left eye but is scared

## 2022-09-29 ENCOUNTER — Ambulatory Visit (INDEPENDENT_AMBULATORY_CARE_PROVIDER_SITE_OTHER): Payer: Medicare Other | Admitting: Family Medicine

## 2022-09-29 ENCOUNTER — Encounter: Payer: Self-pay | Admitting: Family Medicine

## 2022-09-29 VITALS — BP 162/78 | HR 78 | Resp 16 | Ht 64.0 in | Wt 125.0 lb

## 2022-09-29 DIAGNOSIS — E1151 Type 2 diabetes mellitus with diabetic peripheral angiopathy without gangrene: Secondary | ICD-10-CM | POA: Diagnosis not present

## 2022-09-29 DIAGNOSIS — I739 Peripheral vascular disease, unspecified: Secondary | ICD-10-CM

## 2022-09-29 DIAGNOSIS — H548 Legal blindness, as defined in USA: Secondary | ICD-10-CM | POA: Diagnosis not present

## 2022-09-29 DIAGNOSIS — I152 Hypertension secondary to endocrine disorders: Secondary | ICD-10-CM | POA: Diagnosis not present

## 2022-09-29 DIAGNOSIS — E538 Deficiency of other specified B group vitamins: Secondary | ICD-10-CM

## 2022-09-29 DIAGNOSIS — Z7984 Long term (current) use of oral hypoglycemic drugs: Secondary | ICD-10-CM

## 2022-09-29 DIAGNOSIS — F330 Major depressive disorder, recurrent, mild: Secondary | ICD-10-CM | POA: Diagnosis not present

## 2022-09-29 DIAGNOSIS — G547 Phantom limb syndrome without pain: Secondary | ICD-10-CM | POA: Diagnosis not present

## 2022-09-29 DIAGNOSIS — E1159 Type 2 diabetes mellitus with other circulatory complications: Secondary | ICD-10-CM

## 2022-09-29 DIAGNOSIS — E440 Moderate protein-calorie malnutrition: Secondary | ICD-10-CM | POA: Diagnosis not present

## 2022-09-29 DIAGNOSIS — S98912D Complete traumatic amputation of left foot, level unspecified, subsequent encounter: Secondary | ICD-10-CM

## 2022-09-29 DIAGNOSIS — E559 Vitamin D deficiency, unspecified: Secondary | ICD-10-CM

## 2022-09-29 DIAGNOSIS — R151 Fecal smearing: Secondary | ICD-10-CM

## 2022-09-29 DIAGNOSIS — I5032 Chronic diastolic (congestive) heart failure: Secondary | ICD-10-CM

## 2022-09-29 DIAGNOSIS — E785 Hyperlipidemia, unspecified: Secondary | ICD-10-CM | POA: Diagnosis not present

## 2022-09-29 LAB — POCT GLYCOSYLATED HEMOGLOBIN (HGB A1C): Hemoglobin A1C: 7.3 % — AB (ref 4.0–5.6)

## 2022-09-29 MED ORDER — DAPAGLIFLOZIN PROPANEDIOL 10 MG PO TABS: ORAL_TABLET | ORAL | 1 refills | Status: DC

## 2022-09-29 MED ORDER — LOSARTAN POTASSIUM 50 MG PO TABS: 50.0000 mg | ORAL_TABLET | Freq: Every day | ORAL | 1 refills | Status: DC

## 2022-09-29 MED ORDER — METOPROLOL SUCCINATE ER 25 MG PO TB24
25.0000 mg | ORAL_TABLET | Freq: Every day | ORAL | 1 refills | Status: DC
Start: 2022-09-29 — End: 2023-01-30

## 2022-09-29 MED ORDER — ROSUVASTATIN CALCIUM 10 MG PO TABS: 10.0000 mg | ORAL_TABLET | Freq: Every day | ORAL | 1 refills | Status: DC

## 2022-09-29 MED ORDER — SITAGLIPTIN PHOSPHATE 100 MG PO TABS: 100.0000 mg | ORAL_TABLET | Freq: Every day | ORAL | 1 refills | Status: DC

## 2022-11-03 IMAGING — DX DG CHEST 1V PORT
1 series · 1 of 1 positions shown · non-contrast
Comparison: 02/26/2021

CLINICAL DATA: Shortness of breath and cough

EXAM:
PORTABLE CHEST 1 VIEW

[chest ap]
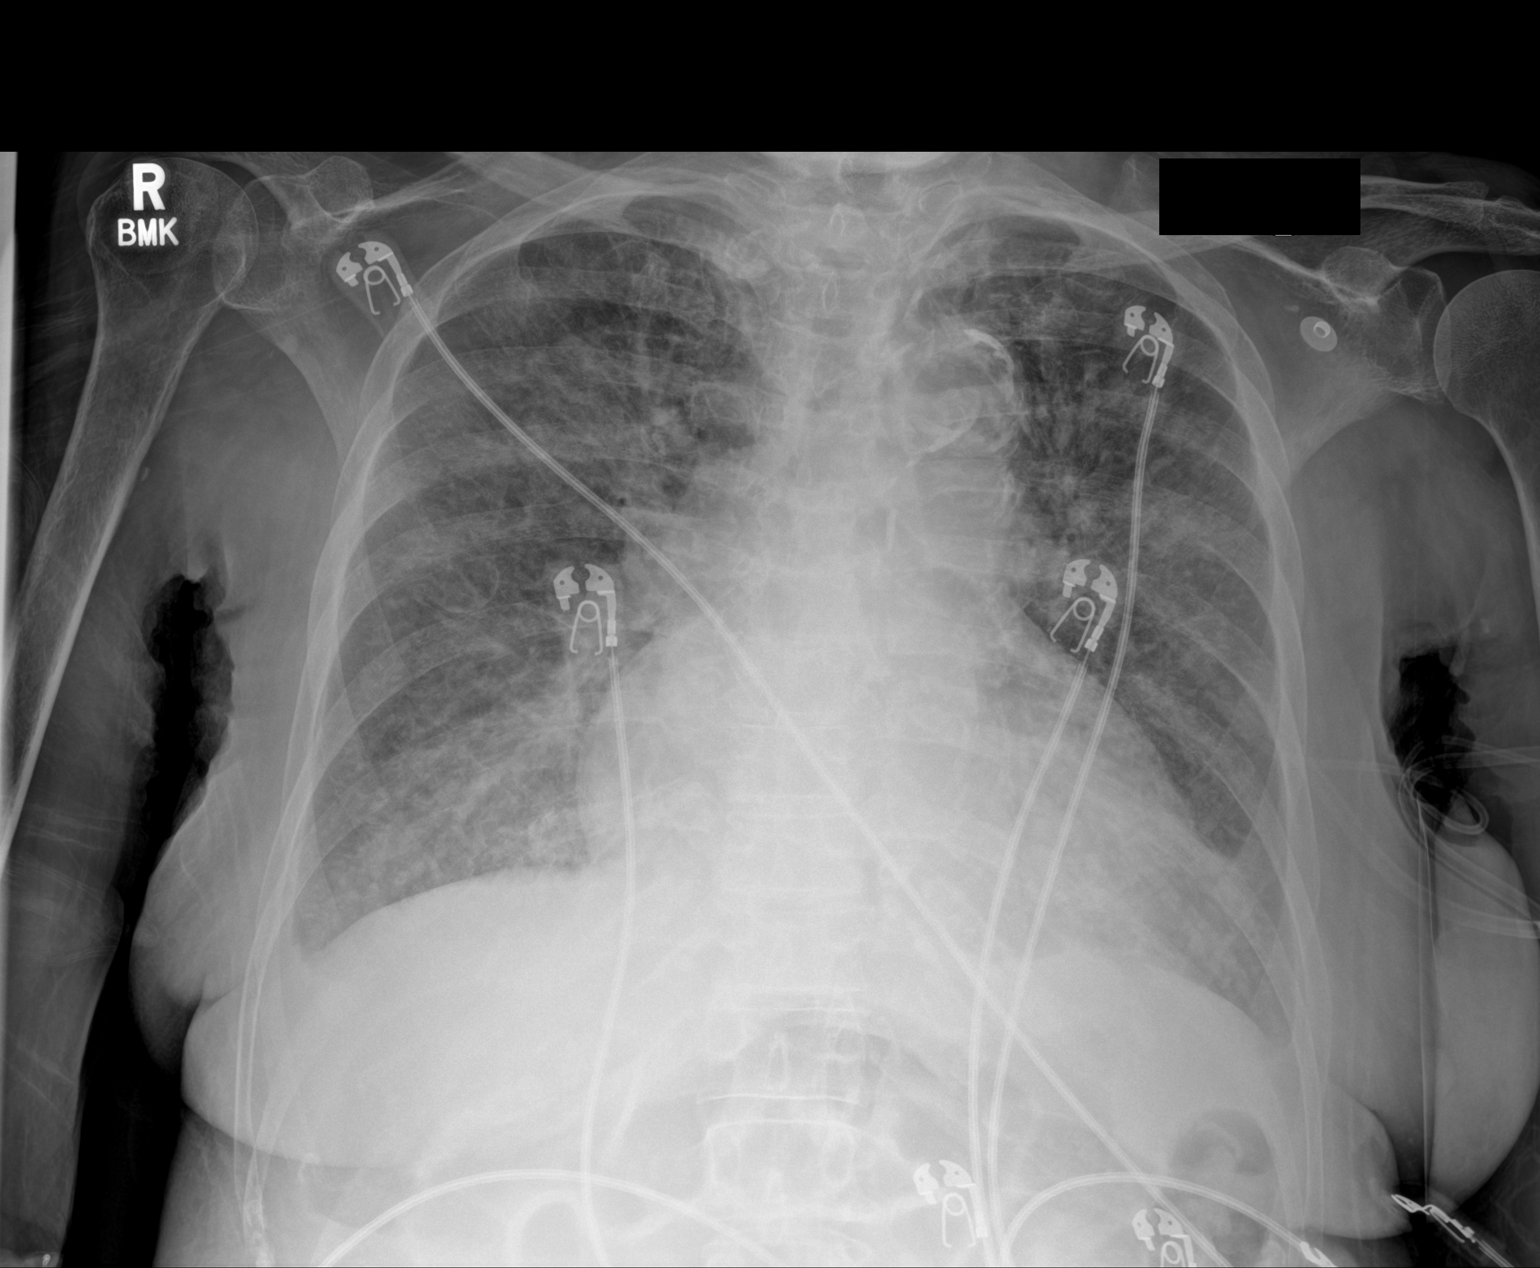

[1 of 1 positions shown; findings below may reference images not displayed]

FINDINGS: Cardiac shadow is enlarged but stable. Aortic calcifications are
noted. Diffuse increased airspace opacity is noted consistent with
worsening edema. No sizable effusion is seen. No confluent
infiltrate is noted. No bony abnormalities are seen.
IMPRESSION: Bilateral pulmonary edema.

## 2022-12-19 ENCOUNTER — Other Ambulatory Visit: Payer: Self-pay | Admitting: Family Medicine

## 2022-12-19 DIAGNOSIS — E119 Type 2 diabetes mellitus without complications: Secondary | ICD-10-CM

## 2022-12-19 DIAGNOSIS — E559 Vitamin D deficiency, unspecified: Secondary | ICD-10-CM

## 2022-12-23 ENCOUNTER — Other Ambulatory Visit: Payer: Self-pay | Admitting: Family Medicine

## 2022-12-23 DIAGNOSIS — E119 Type 2 diabetes mellitus without complications: Secondary | ICD-10-CM

## 2023-01-16 DIAGNOSIS — H401122 Primary open-angle glaucoma, left eye, moderate stage: Secondary | ICD-10-CM | POA: Diagnosis not present

## 2023-01-16 DIAGNOSIS — H2512 Age-related nuclear cataract, left eye: Secondary | ICD-10-CM | POA: Diagnosis not present

## 2023-01-16 DIAGNOSIS — H401113 Primary open-angle glaucoma, right eye, severe stage: Secondary | ICD-10-CM | POA: Diagnosis not present

## 2023-01-16 DIAGNOSIS — E119 Type 2 diabetes mellitus without complications: Secondary | ICD-10-CM | POA: Diagnosis not present

## 2023-01-29 NOTE — Progress Notes (Signed)
Name: Lisa [name]   MRN: [id number]    DOB: 08/22/1945   Date:01/30/2023       Progress Note  Subjective  Chief Complaint  Follow Up  HPI  DMII: She is taking Januvia 100 mg, Marcelline Deist and  is now off Actos. A1C went from 6.5 % to 7.3 % and is down to 6.4 % . She denies polyphagia, polydipsia but has polyuria . She has PAD and HTN. She is on ARB, sees podiatrist and vascular surgeon.   Malnutrition/Diarrhea : she was seen by Dr. Maximino Greenland int he past for weight loss ,diarrhea and incontinence but she did not go back for further evaluation, weight is stable but bowel movements still frequent 3-4 x per day , no longer wanting to leave the house due to fecal smearing and diarrhea. She was seen by Dr. Tobi Bastos and he advised to have labs/stool study and to have colonoscopy but she is not ready to have it done, she states currently more worried about her vision. She continues to lose weight, explained importance of going back to GI   MDD  she is feeling worse, lack of motivation, worries about her decrease in vision, she is still  having diarrhea, also has urinary frequency and urgency, not leaving her house. She is getting more isolated because she does not feel comfortable leaving her house. She is ready to see a psychiatrist . Currently taking celexa but still has down days, does not like aging   HTN/CHF: she was admitted in Nov 2022 , she is on Losartan and Metoprolol, she is not sure if she is taking furosemide . She denies side effects of medications. She is having chest pain intermittently since last week , can last up to one hour , described as dull, not associated with SOB or diaphoresis, she has not seen cardiologist since hospital stay. She did not take her bp medications or plavix today yet  She is off Actos and is now on SGL2 agonist for Diabetes and CHF   Nocturnal enuresis: she states Gemtesa given by urologist helps symptoms slightly but she has not been back for follow up   PAD: she was  recently seen by Dr. Wyn Quaker, 01/2021 and ABI was 0.67  on the left  and 1.1 on right.  History of left distal foot amputation, she has phantom pain , she has pain on both legs from PAD. She also has a history of neuropathy and states staggers when walking. She is taking Crestor and Plavix    Low vitamin D and B12: taking supplementation and we will recheck labs today    Patient Active Problem List   Diagnosis Date Noted   Unilateral amputation of left foot (HCC) 02/19/2022   Hypertension associated with type 2 diabetes mellitus (HCC) 02/19/2022   Chronic diastolic heart failure (HCC) 04/09/2021   Nicotine dependence 04/09/2021   Syncope and collapse 02/26/2021   Prolonged QT interval 02/26/2021   DM (diabetes mellitus), type 2 with peripheral vascular complications (HCC) 09/25/2015   Amputation of left foot (HCC) 12/02/2014   Benign essential HTN 12/02/2014   Depression, major, recurrent, mild (HCC) 12/02/2014   Dyslipidemia 12/02/2014   Vitamin B12 deficiency 12/02/2014   Peripheral neuropathic pain 12/02/2014   Peripheral artery disease (HCC) 12/02/2014   Phantom limb (HCC) 12/02/2014   Diabetes mellitus with nephropathy (HCC) 12/02/2014   Detached retina 12/02/2014   Type 2 diabetes mellitus with peripheral neuropathy (HCC) 12/02/2014   Tobacco use 12/02/2014   Urge incontinence 12/02/2014  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   Vitamin D deficiency 12/02/2014    Past Surgical History:  Procedure Laterality Date   ABDOMINAL HYSTERECTOMY  1992   Total   EYE SURGERY     TOE AMPUTATION Left 2011   All of her toes on the left foot   VASCULAR SURGERY      Family History  Problem Relation Age of Onset   Heart disease Father    Diabetes Mother    Diabetes Brother    Kidney disease Brother        Transplant   Heart disease Brother    Diabetes Sister    Diabetes Sister     Social History   Tobacco Use   Smoking status: Every Day    Current packs/day: 0.25    Average packs/day:  0.3 packs/day for 40.0 years (10.0 ttl pk-yrs)    Types: Cigarettes    Start date: 03/16/2000   Smokeless tobacco: Never  Substance Use Topics   Alcohol use: No    Alcohol/week: 0.0 standard drinks of alcohol     Current Outpatient Medications:    Accu-Chek Softclix Lancets lancets, See admin instructions., Disp: , Rfl:    aspirin 81 MG tablet, Take 81 mg by mouth daily., Disp: , Rfl:    atropine 1 % ophthalmic solution, Place 1 drop into the right eye 3 (three) times daily., Disp: , Rfl:    blood glucose meter kit and supplies KIT, DISPENSE BASED ON PATIENT AND INSURANCE PREFERENCE. USE UP TO FOUR TIMES DAILY AS DIRECTED. (FOR ICD-10 E10.9, E11.9)., Disp: , Rfl:    blood glucose meter kit and supplies, Dispense based on patient and insurance preference. Use up to four times daily as directed. (FOR ICD-10 E10.9, E11.9)., Disp: 1 each, Rfl: 0   brimonidine (ALPHAGAN) 0.2 % ophthalmic solution, Place 1 drop into both eyes 2 (two) times daily., Disp: , Rfl: 3   citalopram (CELEXA) 10 MG tablet, Take 1 tablet (10 mg total) by mouth daily., Disp: 90 tablet, Rfl: 1   clopidogrel (PLAVIX) 75 MG tablet, TAKE 1 TABLET BY MOUTH EVERY DAY, Disp: 90 tablet, Rfl: 1   Cyanocobalamin (B-12) 1000 MCG SUBL, Place 1 tablet under the tongue daily., Disp: 90 tablet, Rfl: 1   dapagliflozin propanediol (FARXIGA) 10 MG TABS tablet, TAKE 1 TABLET BY MOUTH EVERY DAY BEFORE BREAKFAST, Disp: 90 tablet, Rfl: 1   dorzolamide-timolol (COSOPT) 22.3-6.8 MG/ML ophthalmic solution, SMARTSIG:In Eye(s), Disp: , Rfl:    furosemide (LASIX) 40 MG tablet, Take 1 tablet (40 mg total) by mouth daily., Disp: 30 tablet, Rfl: 1   glucose blood (ACCU-CHEK GUIDE) test strip, DISPENSE BASED ON PATIENT AND INSURANCE PREFERENCE. USE UP TO FOUR TIMES DAILY AS DIRECTED. (FOR ICD-10 E10.9, E11.9)., Disp: 100 strip, Rfl: 1   losartan (COZAAR) 50 MG tablet, Take 1 tablet (50 mg total) by mouth daily., Disp: 90 tablet, Rfl: 1   metoprolol  succinate (TOPROL XL) 25 MG 24 hr tablet, Take 1 tablet (25 mg total) by mouth daily. Take with or immediately following a meal., Disp: 90 tablet, Rfl: 1   Multiple Vitamin (MULTIVITAMIN) tablet, Take 1 tablet by mouth daily., Disp: , Rfl:    nitroGLYCERIN (NITROSTAT) 0.4 MG SL tablet, Place 1 tablet (0.4 mg total) under the tongue every 5 (five) minutes as needed for chest pain., Disp: 15 tablet, Rfl: 12   potassium chloride SA (KLOR-CON M20) 20 MEQ tablet, Take 1 tablet (20 mEq total) by mouth 2 (two) times daily. But  three times a day for the next few days and return for labs, Disp: 60 tablet, Rfl: 0   prednisoLONE acetate (PRED FORTE) 1 % ophthalmic suspension, Place into the right eye., Disp: , Rfl:    psyllium (METAMUCIL) 0.52 g capsule, Take 1 capsule (0.52 g total) by mouth daily., Disp: 90 capsule, Rfl: 3   rosuvastatin (CRESTOR) 10 MG tablet, Take 1 tablet (10 mg total) by mouth daily., Disp: 90 tablet, Rfl: 1   sitaGLIPtin (JANUVIA) 100 MG tablet, Take 1 tablet (100 mg total) by mouth daily., Disp: 90 tablet, Rfl: 1   Vibegron (GEMTESA) 75 MG TABS, Take 75 mg by mouth daily., Disp: 30 tablet, Rfl: 11   Vitamin D, Ergocalciferol, (DRISDOL) 1.25 MG (50000 UNIT) CAPS capsule, TAKE 1 CAPSULE (50,000 UNITS TOTAL) BY MOUTH EVERY 7 (SEVEN) DAYS, Disp: 8 capsule, Rfl: 1  Allergies  Allergen Reactions   Other Itching    Nucinta   Tapentadol Hcl    Actos [Pioglitazone]     History of CHF    I personally reviewed active problem list, medication list, allergies, family history, social history, health maintenance with the patient/caregiver today.   ROS  Ten systems reviewed and is negative except as mentioned in HPI    Objective  Vitals:   01/30/23 1305 01/30/23 1314  BP: (!) 152/82 (!) 152/76  Pulse: 71   Resp: 16   SpO2: 98%   Weight: 122 lb (55.3 kg)   Height: 5\' 4"  (1.626 m)     Body mass index is 20.94 kg/m.  Physical Exam  Constitutional: Patient appears well-developed  and malnourished No distress.  HEENT: head atraumatic, normocephalic, pupils equal and reactive to light,, neck supple, throat within normal limits Cardiovascular: Normal rate, regular rhythm and normal heart sounds.  No murmur heard. No BLE edema. Pulmonary/Chest: Effort normal and breath sounds normal. No respiratory distress. Abdominal: Soft.  There is no tenderness. Psychiatric: Patient has a normal mood and affect. behavior is normal. Judgment and thought content normal.   Recent Results (from the past 2160 hour(s))  POCT HgB A1C     Status: Abnormal   Collection Time: 01/30/23  1:11 PM  Result Value Ref Range   Hemoglobin A1C 6.4 (A) 4.0 - 5.6 %   HbA1c POC (<> result, manual entry)     HbA1c, POC (prediabetic range)     HbA1c, POC (controlled diabetic range)        PHQ2/9:    01/30/2023    1:09 PM 09/29/2022    1:53 PM 03/19/2022    9:09 AM 02/19/2022   10:30 AM 08/15/2021   11:08 AM  Depression screen PHQ 2/9  Decreased Interest 0 1 2 0 0  Down, Depressed, Hopeless 0 1 2 2  0  PHQ - 2 Score 0 2 4 2  0  Altered sleeping 0 0 0 0 0  Tired, decreased energy 1 3  1  0  Change in appetite 0 0 0 0 0  Feeling bad or failure about yourself  0 0 0 0 0  Trouble concentrating 1 0 1 1 0  Moving slowly or fidgety/restless 1 0 1 0 0  Suicidal thoughts 0 0 0 0 0  PHQ-9 Score 3 5 6 4  0    phq 9 is negative   Fall Risk:    01/30/2023    1:09 PM 09/29/2022    1:52 PM 02/19/2022   10:30 AM 08/15/2021   11:08 AM 07/30/2021    1:41 PM  Fall  Risk   Falls in the past year? 0 0 1 0 0  Number falls in past yr: 0 0 0 0 0  Injury with Fall? 0 0  0 0  Risk for fall due to : Impaired balance/gait No Fall Risks Impaired vision;History of fall(s) Impaired balance/gait Impaired balance/gait  Follow up Falls prevention discussed Falls prevention discussed Education provided;Falls evaluation completed;Falls prevention discussed Falls prevention discussed Falls prevention discussed      Functional  Status Survey: Is the patient deaf or have difficulty hearing?: No Does the patient have difficulty seeing, even when wearing glasses/contacts?: No Does the patient have difficulty concentrating, remembering, or making decisions?: No Does the patient have difficulty walking or climbing stairs?: Yes Does the patient have difficulty dressing or bathing?: No Does the patient have difficulty doing errands alone such as visiting a doctor's office or shopping?: No    Assessment & Plan  1. Hypertension associated with type 2 diabetes mellitus (HCC)  - POCT HgB A1C - COMPLETE METABOLIC PANEL WITH GFR - Urine Microalbumin w/creat. ratio - HM Diabetes Foot Exam - Lipid panel - CBC with Differential/Platelet - losartan (COZAAR) 50 MG tablet; Take 1 tablet (50 mg total) by mouth daily.  Dispense: 90 tablet; Refill: 1 - metoprolol succinate (TOPROL XL) 25 MG 24 hr tablet; Take 1 tablet (25 mg total) by mouth daily. Take with or immediately following a meal.  Dispense: 90 tablet; Refill: 1 - sitaGLIPtin (JANUVIA) 100 MG tablet; Take 1 tablet (100 mg total) by mouth daily.  Dispense: 90 tablet; Refill: 1  2. Phantom limb (HCC)  stable  3. Peripheral artery disease (HCC)  - Lipid panel - clopidogrel (PLAVIX) 75 MG tablet; Take 1 tablet (75 mg total) by mouth daily.  Dispense: 90 tablet; Refill: 1 - rosuvastatin (CRESTOR) 10 MG tablet; Take 1 tablet (10 mg total) by mouth daily.  Dispense: 90 tablet; Refill: 1  4. Amputation of left foot, subsequent encounter (HCC)  Under the care of Dr. Wyn Quaker  5. Chronic diastolic heart failure (HCC)  - Ambulatory referral to Cardiology  6. Moderate protein-calorie malnutrition (HCC)  She continues to lose weight, needs to follow up with Dr. Tobi Bastos   7. Depression, major, recurrent, mild (HCC)  - citalopram (CELEXA) 10 MG tablet; Take 1 tablet (10 mg total) by mouth daily.  Dispense: 90 tablet; Refill: 1  8. Vitamin D deficiency  - VITAMIN D 25  Hydroxy (Vit-D Deficiency, Fractures)  9. Legally blind in right eye, as defined in [place]  Under the care of eye center  10. Vitamin B12 deficiency  - B12 and Folate Panel - CBC with Differential/Platelet

## 2023-01-30 ENCOUNTER — Encounter: Payer: Self-pay | Admitting: Family Medicine

## 2023-01-30 ENCOUNTER — Ambulatory Visit (INDEPENDENT_AMBULATORY_CARE_PROVIDER_SITE_OTHER): Payer: Medicare Other | Admitting: Family Medicine

## 2023-01-30 VITALS — BP 150/78 | HR 71 | Resp 16 | Ht 64.0 in | Wt 122.0 lb

## 2023-01-30 DIAGNOSIS — E1159 Type 2 diabetes mellitus with other circulatory complications: Secondary | ICD-10-CM | POA: Diagnosis not present

## 2023-01-30 DIAGNOSIS — H548 Legal blindness, as defined in USA: Secondary | ICD-10-CM | POA: Diagnosis not present

## 2023-01-30 DIAGNOSIS — I739 Peripheral vascular disease, unspecified: Secondary | ICD-10-CM | POA: Diagnosis not present

## 2023-01-30 DIAGNOSIS — E538 Deficiency of other specified B group vitamins: Secondary | ICD-10-CM

## 2023-01-30 DIAGNOSIS — E559 Vitamin D deficiency, unspecified: Secondary | ICD-10-CM | POA: Diagnosis not present

## 2023-01-30 DIAGNOSIS — G547 Phantom limb syndrome without pain: Secondary | ICD-10-CM | POA: Diagnosis not present

## 2023-01-30 DIAGNOSIS — E440 Moderate protein-calorie malnutrition: Secondary | ICD-10-CM | POA: Diagnosis not present

## 2023-01-30 DIAGNOSIS — F330 Major depressive disorder, recurrent, mild: Secondary | ICD-10-CM | POA: Diagnosis not present

## 2023-01-30 DIAGNOSIS — Z7984 Long term (current) use of oral hypoglycemic drugs: Secondary | ICD-10-CM

## 2023-01-30 DIAGNOSIS — I152 Hypertension secondary to endocrine disorders: Secondary | ICD-10-CM

## 2023-01-30 DIAGNOSIS — S98912D Complete traumatic amputation of left foot, level unspecified, subsequent encounter: Secondary | ICD-10-CM

## 2023-01-30 DIAGNOSIS — I5032 Chronic diastolic (congestive) heart failure: Secondary | ICD-10-CM | POA: Diagnosis not present

## 2023-01-30 LAB — POCT GLYCOSYLATED HEMOGLOBIN (HGB A1C): Hemoglobin A1C: 6.4 % — AB (ref 4.0–5.6)

## 2023-01-30 MED ORDER — CITALOPRAM HYDROBROMIDE 10 MG PO TABS
10.0000 mg | ORAL_TABLET | Freq: Every day | ORAL | 1 refills | Status: DC
Start: 2023-01-30 — End: 2024-02-02

## 2023-01-30 MED ORDER — CLOPIDOGREL BISULFATE 75 MG PO TABS
75.0000 mg | ORAL_TABLET | Freq: Every day | ORAL | 1 refills | Status: DC
Start: 2023-01-30 — End: 2024-02-02

## 2023-01-30 MED ORDER — DAPAGLIFLOZIN PROPANEDIOL 10 MG PO TABS: ORAL_TABLET | ORAL | 1 refills | Status: DC

## 2023-01-30 MED ORDER — METOPROLOL SUCCINATE ER 25 MG PO TB24
25.0000 mg | ORAL_TABLET | Freq: Every day | ORAL | 1 refills | Status: DC
Start: 2023-01-30 — End: 2024-02-02

## 2023-01-30 MED ORDER — LOSARTAN POTASSIUM 50 MG PO TABS
50.0000 mg | ORAL_TABLET | Freq: Every day | ORAL | 1 refills | Status: DC
Start: 2023-01-30 — End: 2023-04-14

## 2023-01-30 MED ORDER — SITAGLIPTIN PHOSPHATE 100 MG PO TABS
100.0000 mg | ORAL_TABLET | Freq: Every day | ORAL | 1 refills | Status: DC
Start: 2023-01-30 — End: 2024-02-02

## 2023-01-30 MED ORDER — ROSUVASTATIN CALCIUM 10 MG PO TABS
10.0000 mg | ORAL_TABLET | Freq: Every day | ORAL | 1 refills | Status: DC
Start: 2023-01-30 — End: 2024-02-02

## 2023-01-30 NOTE — Patient Instructions (Addendum)
Team Member Role and Visual merchandiser Info Address Start End Comments  Wyline Mood, MD Consulting Physician (Gastroenterology) Phone: [phone] Fax: [phone] Email: [email or web address] [address] [place] [address] 01/30/2023 - -   Team Member Role and Specialty Contact Info Address Start End Comments  Vanna Scotland, MD Consulting Physician (Urology) Phone: [phone] Fax: [phone] Email: [email or web address] [address] [place] [address] 01/30/2023 - -

## 2023-02-03 DIAGNOSIS — E1159 Type 2 diabetes mellitus with other circulatory complications: Secondary | ICD-10-CM | POA: Diagnosis not present

## 2023-02-03 DIAGNOSIS — I739 Peripheral vascular disease, unspecified: Secondary | ICD-10-CM | POA: Diagnosis not present

## 2023-02-03 DIAGNOSIS — E559 Vitamin D deficiency, unspecified: Secondary | ICD-10-CM | POA: Diagnosis not present

## 2023-02-03 DIAGNOSIS — E538 Deficiency of other specified B group vitamins: Secondary | ICD-10-CM | POA: Diagnosis not present

## 2023-02-03 DIAGNOSIS — I152 Hypertension secondary to endocrine disorders: Secondary | ICD-10-CM | POA: Diagnosis not present

## 2023-02-04 LAB — CBC WITH DIFFERENTIAL/PLATELET
Absolute Monocytes: 497 {cells}/uL (ref 200–950)
Basophils Absolute: 22 {cells}/uL (ref 0–200)
Basophils Relative: 0.4 %
Eosinophils Absolute: 59 {cells}/uL (ref 15–500)
Eosinophils Relative: 1.1 %
HCT: 39.5 % (ref 35.0–45.0)
Hemoglobin: 12.4 g/dL (ref 11.7–15.5)
Lymphs Abs: 1021 {cells}/uL (ref 850–3900)
MCH: 27.6 pg (ref 27.0–33.0)
MCHC: 31.4 g/dL — ABNORMAL LOW (ref 32.0–36.0)
MCV: 87.8 fL (ref 80.0–100.0)
MPV: 11.8 fL (ref 7.5–12.5)
Monocytes Relative: 9.2 %
Neutro Abs: 3802 {cells}/uL (ref 1500–7800)
Neutrophils Relative %: 70.4 %
Platelets: 253 10*3/uL (ref 140–400)
RBC: 4.5 10*6/uL (ref 3.80–5.10)
RDW: 14.5 % (ref 11.0–15.0)
Total Lymphocyte: 18.9 %
WBC: 5.4 10*3/uL (ref 3.8–10.8)

## 2023-02-04 LAB — COMPLETE METABOLIC PANEL WITH GFR
AG Ratio: 1.4 (calc) (ref 1.0–2.5)
ALT: 14 U/L (ref 6–29)
AST: 18 U/L (ref 10–35)
Albumin: 3.6 g/dL (ref 3.6–5.1)
Alkaline phosphatase (APISO): 81 U/L (ref 37–153)
BUN: 8 mg/dL (ref 7–25)
CO2: 25 mmol/L (ref 20–32)
Calcium: 8.8 mg/dL (ref 8.6–10.4)
Chloride: 108 mmol/L (ref 98–110)
Creat: 0.77 mg/dL (ref 0.60–1.00)
Globulin: 2.5 g/dL (ref 1.9–3.7)
Glucose, Bld: 132 mg/dL — ABNORMAL HIGH (ref 65–99)
Potassium: 3.6 mmol/L (ref 3.5–5.3)
Sodium: 141 mmol/L (ref 135–146)
Total Bilirubin: 0.5 mg/dL (ref 0.2–1.2)
Total Protein: 6.1 g/dL (ref 6.1–8.1)
eGFR: 79 mL/min/{1.73_m2} (ref >=60)

## 2023-02-04 LAB — B12 AND FOLATE PANEL
Folate: >24 ng/mL
Vitamin B-12: 409 pg/mL (ref 200–1100)

## 2023-02-04 LAB — LIPID PANEL
Cholesterol: 129 mg/dL (ref <200)
HDL: 60 mg/dL (ref >=50)
LDL Cholesterol (Calc): 52 mg/dL
Non-HDL Cholesterol (Calc): 69 mg/dL (ref <130)
Total CHOL/HDL Ratio: 2.2 (calc) (ref <5.0)
Triglycerides: 89 mg/dL (ref <150)

## 2023-02-04 LAB — VITAMIN D 25 HYDROXY (VIT D DEFICIENCY, FRACTURES): Vit D, 25-Hydroxy: 37 ng/mL (ref 30–100)

## 2023-02-17 DIAGNOSIS — H2512 Age-related nuclear cataract, left eye: Secondary | ICD-10-CM | POA: Diagnosis not present

## 2023-02-18 ENCOUNTER — Encounter: Payer: Self-pay | Admitting: Ophthalmology

## 2023-02-18 NOTE — Anesthesia Preprocedure Evaluation (Addendum)
Anesthesia Evaluation  Patient identified by MRN, date of birth, ID band Patient awake    Reviewed: Allergy & Precautions, H&P , NPO status , Patient's Chart, lab work & pertinent test results  Airway Mallampati: III  TM Distance: >3 FB Neck ROM: Full    Dental no notable dental hx. (+) Poor Dentition, Edentulous Upper   Pulmonary Current Smoker   Pulmonary exam normal breath sounds clear to auscultation       Cardiovascular hypertension, + Peripheral Vascular Disease and +CHF  Normal cardiovascular exam Rhythm:Regular Rate:Normal  02-26-21 1. Left ventricular ejection fraction, by estimation, is 60 to 65%. The  left ventricle has normal function. The left ventricle has no regional  wall motion abnormalities. There is mild left ventricular hypertrophy.  Left ventricular diastolic parameters  are consistent with Grade II diastolic dysfunction (pseudonormalization).   2. Right ventricular systolic function was not well visualized. The right  ventricular size is not well visualized.   3. Left atrial size was moderately dilated.   4. Right atrial size was mildly dilated.   5. Trivial effusion versus pericardial fat pad.   6. The mitral valve is degenerative. No evidence of mitral valve  regurgitation. No evidence of mitral stenosis. Moderate mitral annular  calcification.   7. The aortic valve is tricuspid. Aortic valve regurgitation is not  visualized. Mild aortic valve sclerosis is present, with no evidence of  aortic valve stenosis.     Neuro/Psych  PSYCHIATRIC DISORDERS  Depression     Neuromuscular disease negative neurological ROS  negative psych ROS   GI/Hepatic negative GI ROS, Neg liver ROS,,,  Endo/Other  diabetes    Renal/GU Renal diseasenegative Renal ROS  negative genitourinary   Musculoskeletal negative musculoskeletal ROS (+)    Abdominal   Peds negative pediatric ROS (+)  Hematology negative  hematology ROS (+)   Anesthesia Other Findings Diabetes mellitus without complication Hypertension Glaucoma  Vascular disease Blindness of right eye DVT of leg (deep venous thrombosis)  Depression  Hyperlipidemia CHF (congestive heart failure)  Grade II diastolic dysfunction    Hypoglycemic; administered D50W and recheck accucheck    Reproductive/Obstetrics negative OB ROS                             Anesthesia Physical Anesthesia Plan  ASA: 3  Anesthesia Plan: MAC   Post-op Pain Management:    Induction: Intravenous  PONV Risk Score and Plan:   Airway Management Planned: Natural Airway and Nasal Cannula  Additional Equipment:   Intra-op Plan:   Post-operative Plan:   Informed Consent: I have reviewed the patients History and Physical, chart, labs and discussed the procedure including the risks, benefits and alternatives for the proposed anesthesia with the patient or authorized representative who has indicated his/her understanding and acceptance.     Dental Advisory Given  Plan Discussed with: Anesthesiologist, CRNA and Surgeon  Anesthesia Plan Comments: (Patient consented for risks of anesthesia including but not limited to:  - adverse reactions to medications - damage to eyes, teeth, lips or other oral mucosa - nerve damage due to positioning  - sore throat or hoarseness - Damage to heart, brain, nerves, lungs, other parts of body or loss of life  Patient voiced understanding.)        Anesthesia Quick Evaluation

## 2023-02-23 ENCOUNTER — Ambulatory Visit: Payer: Self-pay

## 2023-02-23 ENCOUNTER — Ambulatory Visit (INDEPENDENT_AMBULATORY_CARE_PROVIDER_SITE_OTHER): Payer: Medicare Other | Admitting: Nurse Practitioner

## 2023-02-23 ENCOUNTER — Other Ambulatory Visit: Payer: Self-pay

## 2023-02-23 ENCOUNTER — Encounter: Payer: Self-pay | Admitting: Nurse Practitioner

## 2023-02-23 VITALS — BP 120/72 | HR 57 | Temp 98.1°F | Resp 14 | Ht 64.0 in | Wt 123.3 lb

## 2023-02-23 DIAGNOSIS — M542 Cervicalgia: Secondary | ICD-10-CM | POA: Diagnosis not present

## 2023-02-23 DIAGNOSIS — T148XXA Other injury of unspecified body region, initial encounter: Secondary | ICD-10-CM

## 2023-02-23 NOTE — Telephone Encounter (Signed)
Chief Complaint: Neck pain to left side radiating to the left shoulder down to the left elbow Symptoms: Pain 4-7/10 after taking Tylenol Frequency: Onset 5 days ago Pertinent Negatives: Patient denies other symptoms Disposition: [] ED /[] Urgent Care (no appt availability in office) / [x] Appointment(In office/virtual)/ []  [facility]/ [] Home Care/ [] Refused Recommended Disposition /[] [facility]/ []  Follow-up with PCP Additional Notes: No availability with PCP, agrees to a different provider, scheduled with Della Goo, FNP today.   Reason for Disposition  [1] MODERATE neck pain (e.g., interferes with normal activities) AND [2] present > 3 days  Answer Assessment - Initial Assessment Questions 1. ONSET: "When did the pain begin?"      About 5 days ago, taking Tylenol 2. LOCATION: "Where does it hurt?"      Left side of neck 3. PATTERN "Does the pain come and go, or has it been constant since it started?"      Constant pain w/relief from Tylenol 4. SEVERITY: "How bad is the pain?"  (Scale 1-10; or mild, moderate, severe)   - NO PAIN (0): no pain or only slight stiffness    - MILD (1-3): doesn't interfere with normal activities    - MODERATE (4-7): interferes with normal activities or awakens from sleep    - SEVERE (8-10):  excruciating pain, unable to do any normal activities      Between 4-7 after Tylenol 5. RADIATION: "Does the pain go anywhere else, shoot into your arms?"     Left shoulder to elbow 6. CORD SYMPTOMS: "Any weakness or numbness of the arms or legs?"     Not at the present time 7. CAUSE: "What do you think is causing the neck pain?"     Not sure 8. NECK OVERUSE: "Any recent activities that involved turning or twisting the neck?"     The way I sleep probably 9. OTHER SYMPTOMS: "Do you have any other symptoms?" (e.g., headache, fever, chest pain, difficulty breathing, neck swelling)     No  Protocols used: Neck Pain or Stiffness-A-AH

## 2023-02-23 NOTE — Progress Notes (Signed)
BP 120/72   Pulse (!) 57   Temp 98.1 F (36.7 C) (Oral)   Resp 14   Ht 5\' 4"  (1.626 m)   Wt 123 lb 4.8 oz (55.9 kg)   SpO2 94%   BMI 21.16 kg/m    Subjective:    [name], female    DOB: 09-Jun-1945, 77 y.o.   MRN: [id number]  HPI: Lisa [name] is a 77 y.o. female  Chief Complaint  Patient presents with   Neck Pain    Left side of neck that radiates to shoulder   Neck pain: patient reports she has pain on the left side of her neck that radiates down to her left shoulder. She reports this started Thursday or Friday.  She denies any trauma.  She reports putting pressure on it makes the pain worse.  She reports tylenol  makes the pain better.  She has tried tylenol. She says the neck feels stiff when she tries to turn her head.  She reports that before she took the tylenol she had decrease range of motion.  She has been sleeping on the couch and she thinks that is what caused her pain.   Recommend heat therapy. Discussed taking NSAID and muscle relaxer, patient declined.  She reports that she does not want to take any more medication and will just do the heat therapy.   Relevant past medical, surgical, family and social history reviewed and updated as indicated. Interim medical history since our last visit reviewed. Allergies and medications reviewed and updated.  Review of Systems  Ten systems reviewed and is negative except as mentioned in HPI       Objective:    BP 120/72   Pulse (!) 57   Temp 98.1 F (36.7 C) (Oral)   Resp 14   Ht 5\' 4"  (1.626 m)   Wt 123 lb 4.8 oz (55.9 kg)   SpO2 94%   BMI 21.16 kg/m   Wt Readings from Last 3 Encounters:  02/23/23 123 lb 4.8 oz (55.9 kg)  01/30/23 122 lb (55.3 kg)  09/29/22 125 lb (56.7 kg)    Physical Exam  Constitutional: Patient appears well-developed and well-nourished.  No distress.  HEENT: head atraumatic, normocephalic, pupils equal and reactive to light, neck supple, throat within normal  limit Cardiovascular: Normal rate, regular rhythm and normal heart sounds.  No murmur heard. No BLE edema. Pulmonary/Chest: Effort normal and breath sounds normal. No respiratory distress. Abdominal: Soft.  There is no tenderness. MSK: tenderness to left side of neck, no cspine tenderness Psychiatric: Patient has a normal mood and affect. behavior is normal. Judgment and thought content normal.  Results for orders placed or performed in visit on 01/30/23  COMPLETE METABOLIC PANEL WITH GFR  Result Value Ref Range   Glucose, Bld 132 (H) 65 - 99 mg/dL   BUN 8 7 - 25 mg/dL   Creat 4.09 8.11 - 9.14 mg/dL   eGFR 79 > OR = 60 NW/GNF/6.21H0   BUN/Creatinine Ratio SEE NOTE: 6 - 22 (calc)   Sodium 141 135 - 146 mmol/L   Potassium 3.6 3.5 - 5.3 mmol/L   Chloride 108 98 - 110 mmol/L   CO2 25 20 - 32 mmol/L   Calcium 8.8 8.6 - 10.4 mg/dL   Total Protein 6.1 6.1 - 8.1 g/dL   Albumin 3.6 3.6 - 5.1 g/dL   Globulin 2.5 1.9 - 3.7 g/dL (calc)   AG Ratio 1.4 1.0 - 2.5 (calc)  Total Bilirubin 0.5 0.2 - 1.2 mg/dL   Alkaline phosphatase (APISO) 81 37 - 153 U/L   AST 18 10 - 35 U/L   ALT 14 6 - 29 U/L  Lipid panel  Result Value Ref Range   Cholesterol 129 <200 mg/dL   HDL 60 > OR = 50 mg/dL   Triglycerides 89 <829 mg/dL   LDL Cholesterol (Calc) 52 mg/dL (calc)   Total CHOL/HDL Ratio 2.2 <5.0 (calc)   Non-HDL Cholesterol (Calc) 69 <562 mg/dL (calc)  Z30 and Folate Panel  Result Value Ref Range   Vitamin B-12 409 200 - 1,100 pg/mL   Folate >24.0 ng/mL  VITAMIN D 25 Hydroxy (Vit-D Deficiency, Fractures)  Result Value Ref Range   Vit D, 25-Hydroxy 37 30 - 100 ng/mL  CBC with Differential/Platelet  Result Value Ref Range   WBC 5.4 3.8 - 10.8 Thousand/uL   RBC 4.50 3.80 - 5.10 Million/uL   Hemoglobin 12.4 11.7 - 15.5 g/dL   HCT 86.5 78.4 - 69.6 %   MCV 87.8 80.0 - 100.0 fL   MCH 27.6 27.0 - 33.0 pg   MCHC 31.4 (L) 32.0 - 36.0 g/dL   RDW 29.5 28.4 - 13.2 %   Platelets 253 140 - 400 Thousand/uL    MPV 11.8 7.5 - 12.5 fL   Neutro Abs 3,802 1,500 - 7,800 cells/uL   Lymphs Abs 1,021 850 - 3,900 cells/uL   Absolute Monocytes 497 200 - 950 cells/uL   Eosinophils Absolute 59 15 - 500 cells/uL   Basophils Absolute 22 0 - 200 cells/uL   Neutrophils Relative % 70.4 %   Total Lymphocyte 18.9 %   Monocytes Relative 9.2 %   Eosinophils Relative 1.1 %   Basophils Relative 0.4 %  POCT HgB A1C  Result Value Ref Range   Hemoglobin A1C 6.4 (A) 4.0 - 5.6 %   HbA1c POC (<> result, manual entry)     HbA1c, POC (prediabetic range)     HbA1c, POC (controlled diabetic range)        Assessment & Plan:   Problem List Items Addressed This Visit   None Visit Diagnoses     Neck pain    -  Primary   heat therapy, continue tylenol for pain, declined muscle relaxer   Muscle strain       heat therapy, continue tylenol for pain, declined muscle relaxer        Follow up plan: Return if symptoms worsen or fail to improve.

## 2023-02-24 ENCOUNTER — Encounter: Payer: Self-pay | Admitting: Ophthalmology

## 2023-02-24 NOTE — Discharge Instructions (Signed)

## 2023-02-25 ENCOUNTER — Ambulatory Visit: Payer: Medicare Other | Admitting: Anesthesiology

## 2023-02-25 ENCOUNTER — Other Ambulatory Visit: Payer: Self-pay

## 2023-02-25 ENCOUNTER — Encounter
Admission: RE | Disposition: A | Discharge status: Home / Self Care | Payer: Self-pay | Source: Home / Self Care | Attending: Ophthalmology

## 2023-02-25 ENCOUNTER — Encounter: Payer: Self-pay | Admitting: Ophthalmology

## 2023-02-25 ENCOUNTER — Ambulatory Visit
Admission: RE | Admit: 2023-02-25 | Discharge: 2023-02-25 | Disposition: A | Discharge status: Home / Self Care | Payer: Medicare Other | Attending: Ophthalmology | Admitting: Ophthalmology

## 2023-02-25 DIAGNOSIS — F32A Depression, unspecified: Secondary | ICD-10-CM | POA: Insufficient documentation

## 2023-02-25 DIAGNOSIS — E785 Hyperlipidemia, unspecified: Secondary | ICD-10-CM | POA: Diagnosis not present

## 2023-02-25 DIAGNOSIS — Z86718 Personal history of other venous thrombosis and embolism: Secondary | ICD-10-CM | POA: Diagnosis not present

## 2023-02-25 DIAGNOSIS — E1136 Type 2 diabetes mellitus with diabetic cataract: Secondary | ICD-10-CM | POA: Diagnosis not present

## 2023-02-25 DIAGNOSIS — I110 Hypertensive heart disease with heart failure: Secondary | ICD-10-CM | POA: Diagnosis not present

## 2023-02-25 DIAGNOSIS — H409 Unspecified glaucoma: Secondary | ICD-10-CM | POA: Insufficient documentation

## 2023-02-25 DIAGNOSIS — H2512 Age-related nuclear cataract, left eye: Secondary | ICD-10-CM | POA: Diagnosis not present

## 2023-02-25 DIAGNOSIS — I739 Peripheral vascular disease, unspecified: Secondary | ICD-10-CM | POA: Insufficient documentation

## 2023-02-25 DIAGNOSIS — F17210 Nicotine dependence, cigarettes, uncomplicated: Secondary | ICD-10-CM | POA: Diagnosis not present

## 2023-02-25 DIAGNOSIS — H5461 Unqualified visual loss, right eye, normal vision left eye: Secondary | ICD-10-CM | POA: Diagnosis not present

## 2023-02-25 DIAGNOSIS — Z7984 Long term (current) use of oral hypoglycemic drugs: Secondary | ICD-10-CM | POA: Insufficient documentation

## 2023-02-25 DIAGNOSIS — I509 Heart failure, unspecified: Secondary | ICD-10-CM | POA: Diagnosis not present

## 2023-02-25 DIAGNOSIS — I5032 Chronic diastolic (congestive) heart failure: Secondary | ICD-10-CM | POA: Diagnosis not present

## 2023-02-25 HISTORY — PX: CATARACT EXTRACTION W/PHACO: SHX586

## 2023-02-25 HISTORY — DX: Other ill-defined heart diseases: I51.89

## 2023-02-25 LAB — GLUCOSE, CAPILLARY
Glucose-Capillary: 29 mg/dL — CL (ref 70–99)
Glucose-Capillary: 39 mg/dL — CL (ref 70–99)
Glucose-Capillary: 52 mg/dL — ABNORMAL LOW (ref 70–99)
Glucose-Capillary: 168 mg/dL — ABNORMAL HIGH (ref 70–99)
Glucose-Capillary: 28 mg/dL — CL (ref 70–99)
Glucose-Capillary: 142 mg/dL — ABNORMAL HIGH (ref 70–99)

## 2023-02-25 SURGERY — PHACOEMULSIFICATION, CATARACT, WITH IOL INSERTION
Anesthesia: Monitor Anesthesia Care | Site: Eye | Laterality: Left

## 2023-02-25 MED ORDER — SODIUM CHLORIDE 0.9% FLUSH
INTRAVENOUS | Status: DC | PRN
  Administered 2023-02-25 (×2): 10 mL via INTRAVENOUS

## 2023-02-25 MED ORDER — TETRACAINE HCL 0.5 % OP SOLN
1.0000 [drp] | OPHTHALMIC | Status: DC | PRN
  Administered 2023-02-25 (×3): 1 [drp] via OPHTHALMIC

## 2023-02-25 MED ORDER — SIGHTPATH DOSE#1 BSS IO SOLN
INTRAOCULAR | Status: DC | PRN
  Administered 2023-02-25: 99 mL via OPHTHALMIC

## 2023-02-25 MED ORDER — FLUMAZENIL NICU IV SYRINGE 0.1 MG/ML
0.2000 mg | INTRAVENOUS | Status: DC | PRN
  Administered 2023-02-25: 0.2 mg via INTRAVENOUS

## 2023-02-25 MED ORDER — ARMC OPHTHALMIC DILATING DROPS
OPHTHALMIC | Status: AC
  Filled 2023-02-25: qty 0.5

## 2023-02-25 MED ORDER — DEXTROSE 50 % IV SOLN
25.0000 mL | Freq: Once | INTRAVENOUS | Status: AC
  Administered 2023-02-25: 25 mL via INTRAVENOUS

## 2023-02-25 MED ORDER — SIGHTPATH DOSE#1 BSS IO SOLN
INTRAOCULAR | Status: DC | PRN
  Administered 2023-02-25: 1 mL

## 2023-02-25 MED ORDER — LACTATED RINGERS IV SOLN: INTRAVENOUS | Status: DC

## 2023-02-25 MED ORDER — ARMC OPHTHALMIC DILATING DROPS
1.0000 | OPHTHALMIC | Status: DC | PRN
  Administered 2023-02-25 (×3): 1 via OPHTHALMIC

## 2023-02-25 MED ORDER — FLUMAZENIL 0.5 MG/5ML IV SOLN
INTRAVENOUS | Status: AC
  Filled 2023-02-25: qty 5

## 2023-02-25 MED ORDER — MOXIFLOXACIN HCL 0.5 % OP SOLN
OPHTHALMIC | Status: DC | PRN
  Administered 2023-02-25: .2 mL via OPHTHALMIC

## 2023-02-25 MED ORDER — SIGHTPATH DOSE#1 NA HYALUR & NA CHOND-NA HYALUR IO KIT
PACK | INTRAOCULAR | Status: DC | PRN
  Administered 2023-02-25: 1 via OPHTHALMIC

## 2023-02-25 MED ORDER — BRIMONIDINE TARTRATE-TIMOLOL 0.2-0.5 % OP SOLN
OPHTHALMIC | Status: DC | PRN
  Administered 2023-02-25: 1 [drp] via OPHTHALMIC

## 2023-02-25 MED ORDER — TETRACAINE HCL 0.5 % OP SOLN
OPHTHALMIC | Status: AC
  Filled 2023-02-25: qty 4

## 2023-02-25 MED ORDER — SIGHTPATH DOSE#1 BSS IO SOLN
INTRAOCULAR | Status: DC | PRN
  Administered 2023-02-25: 15 mL

## 2023-02-25 MED ORDER — FENTANYL CITRATE (PF) 100 MCG/2ML IJ SOLN
INTRAMUSCULAR | Status: AC
  Filled 2023-02-25: qty 2

## 2023-02-25 MED ORDER — DEXTROSE 50 % IV SOLN
INTRAVENOUS | Status: AC
  Filled 2023-02-25: qty 50

## 2023-02-25 MED ORDER — MIDAZOLAM HCL 2 MG/2ML IJ SOLN
INTRAMUSCULAR | Status: DC | PRN
  Administered 2023-02-25 (×2): 1 mg via INTRAVENOUS

## 2023-02-25 MED ORDER — MIDAZOLAM HCL 2 MG/2ML IJ SOLN
INTRAMUSCULAR | Status: AC
  Filled 2023-02-25: qty 2

## 2023-02-25 MED ORDER — FENTANYL CITRATE (PF) 100 MCG/2ML IJ SOLN
INTRAMUSCULAR | Status: DC | PRN
  Administered 2023-02-25: 50 ug via INTRAVENOUS

## 2023-02-25 SURGICAL SUPPLY — 18 items
CANNULA ANT/CHMB 27G (MISCELLANEOUS) IMPLANT
CANNULA ANT/CHMB 27GA (MISCELLANEOUS)
CATARACT SUITE SIGHTPATH (MISCELLANEOUS) ×1
FEE CATARACT SUITE SIGHTPATH (MISCELLANEOUS) ×1 IMPLANT
GLOVE SRG 8 PF TXTR STRL LF DI (GLOVE) ×1 IMPLANT
GLOVE SURG ENC TEXT LTX SZ7.5 (GLOVE) ×1 IMPLANT
GLOVE SURG GAMMEX PI TX LF 7.5 (GLOVE) IMPLANT
GLOVE SURG UNDER POLY LF SZ8 (GLOVE) ×1
LENS IOL TECNIS EYHANCE 17.0 (Intraocular Lens) IMPLANT
NDL FILTER BLUNT 18X1 1/2 (NEEDLE) ×1 IMPLANT
NDL RETROBULBAR .5 NSTRL (NEEDLE) IMPLANT
NEEDLE FILTER BLUNT 18X1 1/2 (NEEDLE) ×1
PACK VIT ANT 23G (MISCELLANEOUS) IMPLANT
RING MALYGIN 7.0 (MISCELLANEOUS) IMPLANT
SUT ETHILON 10-0 CS-B-6CS-B-6 (SUTURE)
SUT VICRYL 9 0 (SUTURE) IMPLANT
SUTURE EHLN 10-0 CS-B-6CS-B-6 (SUTURE) IMPLANT
SYR 3ML LL SCALE MARK (SYRINGE) ×1 IMPLANT

## 2023-02-25 NOTE — Op Note (Signed)
OPERATIVE NOTE  Lisa [name] [id number] 02/25/2023   PREOPERATIVE DIAGNOSIS:  Nuclear sclerotic cataract left eye. H25.12   POSTOPERATIVE DIAGNOSIS:    Nuclear sclerotic cataract left eye.     PROCEDURE:  Phacoemusification with posterior chamber intraocular lens placement of the left eye  Ultrasound time: Procedure(s) with comments: CATARACT EXTRACTION PHACO AND INTRAOCULAR LENS PLACEMENT (IOC) LEFT DIABETIC  malyugin (Left) - 10.78 0:49.1  LENS:   Implant Name Type Inv. Item Serial No. Manufacturer Lot No. LRB No. Used Action  LENS IOL TECNIS EYHANCE 17.0 - [id number] Intraocular Lens LENS IOL TECNIS EYHANCE 17.0 [id number] [employer]  Left 1 Implanted      SURGEON:  Deirdre Evener, MD   ANESTHESIA:  Topical with tetracaine drops and 2% Xylocaine jelly, augmented with 1% preservative-free intracameral lidocaine.    COMPLICATIONS:  None.   DESCRIPTION OF PROCEDURE:  The patient was identified in the holding room and transported to the operating room and placed in the supine position under the operating microscope.  The left eye was identified as the operative eye and it was prepped and draped in the usual sterile ophthalmic fashion.   A 1 millimeter clear-corneal paracentesis was made at the 1:30 position.  0.5 ml of preservative-free 1% lidocaine was injected into the anterior chamber.  The anterior chamber was filled with Viscoat viscoelastic.  A 2.4 millimeter keratome was used to make a near-clear corneal incision at the 10:30 position.  .  A curvilinear capsulorrhexis was made with a cystotome and capsulorrhexis forceps.  Balanced salt solution was used to hydrodissect and hydrodelineate the nucleus.   Phacoemulsification was then used in stop and chop fashion to remove the lens nucleus and epinucleus.  The remaining cortex was then removed using the irrigation and aspiration handpiece. Provisc was then placed into the capsular bag to distend it for lens placement.  A  lens was then injected into the capsular bag.  The remaining viscoelastic was aspirated.   Wounds were hydrated with balanced salt solution.  The anterior chamber was inflated to a physiologic pressure with balanced salt solution.  No wound leaks were noted. Vigamox 0.2 ml of a 1mg  per ml solution was injected into the anterior chamber for a dose of 0.2 mg of intracameral antibiotic at the completion of the case.   Timolol and Brimonidine drops were applied to the eye.  The patient was taken to the recovery room in stable condition without complications of anesthesia or surgery.  Sissy Goetzke 02/25/2023, 8:45 AM

## 2023-02-25 NOTE — H&P (Signed)
[facility]   Primary Care Physician:  Alba Cory, MD Ophthalmologist: Dr. Lockie Mola  Pre-Procedure History & Physical: HPI:  Lisa [name] is a 77 y.o. female here for ophthalmic surgery.   Past Medical History:  Diagnosis Date   Blindness of right eye    CHF (congestive heart failure) (HCC)    Depression    Diabetes mellitus without complication (HCC)    DVT of leg (deep venous thrombosis) (HCC) 2011   Glaucoma    Grade II diastolic dysfunction    Hyperlipidemia    Hypertension    Vascular disease     Past Surgical History:  Procedure Laterality Date   ABDOMINAL HYSTERECTOMY  1992   Total   EYE SURGERY     TOE AMPUTATION Left 2011   All of her toes on the left foot   VASCULAR SURGERY      Prior to Admission medications   Medication Sig Start Date End Date Taking? Authorizing Provider  aspirin 81 MG tablet Take 81 mg by mouth daily.   Yes [provider]  atropine 1 % ophthalmic solution Place 1 drop into the right eye 3 (three) times daily.   Yes [provider]  brimonidine (ALPHAGAN) 0.2 % ophthalmic solution Place 1 drop into both eyes 2 (two) times daily.   Yes [provider]  citalopram (CELEXA) 10 MG tablet Take 1 tablet (10 mg total) by mouth daily. 01/30/23  Yes Sowles, Danna Hefty, MD  clopidogrel (PLAVIX) 75 MG tablet Take 1 tablet (75 mg total) by mouth daily. 01/30/23  Yes Sowles, Danna Hefty, MD  Cyanocobalamin (B-12) 1000 MCG SUBL Place 1 tablet under the tongue daily. 09/10/22  Yes Sowles, Danna Hefty, MD  dapagliflozin propanediol (FARXIGA) 10 MG TABS tablet TAKE 1 TABLET BY MOUTH EVERY DAY BEFORE BREAKFAST 01/30/23  Yes Sowles, Danna Hefty, MD  dorzolamide-timolol (COSOPT) 22.3-6.8 MG/ML ophthalmic solution SMARTSIG:In Eye(s) 03/02/21  Yes [provider]  furosemide (LASIX) 40 MG tablet Take 1 tablet (40 mg total) by mouth daily. 04/13/21  Yes Enedina Finner, MD  losartan (COZAAR) 50 MG tablet Take 1 tablet (50 mg  total) by mouth daily. 01/30/23  Yes Sowles, Danna Hefty, MD  metoprolol succinate (TOPROL XL) 25 MG 24 hr tablet Take 1 tablet (25 mg total) by mouth daily. Take with or immediately following a meal. 01/30/23 01/30/24 Yes Sowles, Danna Hefty, MD  Multiple Vitamin (MULTIVITAMIN) tablet Take 1 tablet by mouth daily.   Yes [provider]  potassium chloride SA (KLOR-CON M20) 20 MEQ tablet Take 1 tablet (20 mEq total) by mouth 2 (two) times daily. But three times a day for the next few days and return for labs 02/26/22  Yes Sowles, Danna Hefty, MD  prednisoLONE acetate (PRED FORTE) 1 % ophthalmic suspension Place into the right eye. 03/07/21  Yes [provider]  psyllium (METAMUCIL) 0.52 g capsule Take 1 capsule (0.52 g total) by mouth daily. 03/19/21  Yes Caro Laroche, DO  rosuvastatin (CRESTOR) 10 MG tablet Take 1 tablet (10 mg total) by mouth daily. 01/30/23  Yes Sowles, Danna Hefty, MD  sitaGLIPtin (JANUVIA) 100 MG tablet Take 1 tablet (100 mg total) by mouth daily. 01/30/23  Yes Sowles, Danna Hefty, MD  Vibegron (GEMTESA) 75 MG TABS Take 75 mg by mouth daily. 04/08/22  Yes Vanna Scotland, MD  Vitamin D, Ergocalciferol, (DRISDOL) 1.25 MG (50000 UNIT) CAPS capsule TAKE 1 CAPSULE (50,000 UNITS TOTAL) BY MOUTH EVERY 7 (SEVEN) DAYS 12/19/22  Yes Alba Cory, MD  Accu-Chek Softclix Lancets lancets See  admin instructions. Patient not taking: Reported on 02/23/2023 06/24/21   [provider]  blood glucose meter kit and supplies KIT DISPENSE BASED ON PATIENT AND INSURANCE PREFERENCE. USE UP TO FOUR TIMES DAILY AS DIRECTED. (FOR ICD-10 E10.9, E11.9). Patient not taking: Reported on 02/23/2023 07/02/21   [provider]  blood glucose meter kit and supplies Dispense based on patient and insurance preference. Use up to four times daily as directed. (FOR ICD-10 E10.9, E11.9). Patient not taking: Reported on 02/23/2023 06/24/21   Alba Cory, MD  glucose blood (ACCU-CHEK GUIDE) test strip DISPENSE  BASED ON PATIENT AND INSURANCE PREFERENCE. USE UP TO FOUR TIMES DAILY AS DIRECTED. (FOR ICD-10 E10.9, E11.9). Patient not taking: Reported on 02/23/2023 12/19/22   Alba Cory, MD  nitroGLYCERIN (NITROSTAT) 0.4 MG SL tablet Place 1 tablet (0.4 mg total) under the tongue every 5 (five) minutes as needed for chest pain. 04/13/21   Enedina Finner, MD    Allergies as of 02/03/2023 - Review Complete 01/30/2023  Allergen Reaction Noted   Other Itching 07/27/2014   Tapentadol hcl  04/07/2018   Actos [pioglitazone]  06/24/2021    Family History  Problem Relation Age of Onset   Heart disease Father    Diabetes Mother    Diabetes Brother    Kidney disease Brother        Transplant   Heart disease Brother    Diabetes Sister    Diabetes Sister     Social History   Socioeconomic History   Marital status: Widowed    Spouse name: Not on file   Number of children: 0   Years of education: Not on file   Highest education level: Some college, no degree  Occupational History   Occupation: Retired  Tobacco Use   Smoking status: Every Day    Current packs/day: 0.25    Average packs/day: 0.3 packs/day for 40.0 years (10.0 ttl pk-yrs)    Types: Cigarettes    Start date: 03/16/2000   Smokeless tobacco: Never  Vaping Use   Vaping status: Never Used  Substance and Sexual Activity   Alcohol use: No    Alcohol/week: 0.0 standard drinks of alcohol   Drug use: No   Sexual activity: Not Currently  Other Topics Concern   Not on file  Social History Narrative   Not on file   Social Determinants of Health   Financial Resource Strain: Low Risk  (09/19/2022)   Overall Financial Resource Strain (CARDIA)    Difficulty of Paying Living Expenses: Not hard at all  Food Insecurity: No Food Insecurity (03/19/2022)   Hunger Vital Sign    Worried About Running Out of Food in the Last Year: Never true    Ran Out of Food in the Last Year: Never true  Transportation Needs: No Transportation Needs  (09/19/2022)   PRAPARE - Administrator, Civil Service (Medical): No    Lack of Transportation (Non-Medical): No  Physical Activity: Insufficiently Active (03/26/2021)   Exercise Vital Sign    Days of Exercise per Week: 3 days    Minutes of Exercise per Session: 20 min  Stress: No Stress Concern Present (03/26/2021)   [employer] of Occupational Health - Occupational Stress Questionnaire    Feeling of Stress : Only a little  Social Connections: Socially Isolated (03/19/2022)   Social Connection and Isolation Panel [NHANES]    Frequency of Communication with Friends and Family: More than three times a week    Frequency of Social  Gatherings with Friends and Family: Once a week    Attends Religious Services: Never    Database administrator or Organizations: No    Attends Banker Meetings: Never    Marital Status: Widowed  Intimate Partner Violence: Not At Risk (03/16/2018)   Humiliation, Afraid, Rape, and Kick questionnaire    Fear of Current or Ex-Partner: No    Emotionally Abused: No    Physically Abused: No    Sexually Abused: No    Review of Systems: See HPI, otherwise negative ROS  Physical Exam: BP (!) 195/88   Pulse 63   Temp (!) 97.2 F (36.2 C) (Temporal)   Resp 19   Ht 5\' 4"  (1.626 m)   Wt 53.5 kg   SpO2 98%   BMI 20.25 kg/m  General:   Alert,  pleasant and cooperative in NAD Head:  Normocephalic and atraumatic. Lungs:  Clear to auscultation.    Heart:  Regular rate and rhythm.   Impression/Plan: Lisa [name] is here for ophthalmic surgery.  Risks, benefits, limitations, and alternatives regarding ophthalmic surgery have been reviewed with the patient.  Questions have been answered.  All parties agreeable.   Lockie Mola, MD  02/25/2023, 7:35 AM

## 2023-02-25 NOTE — Transfer of Care (Signed)
Immediate Anesthesia Transfer of Care Note  Patient: Lisa [name]  Procedure(s) Performed: CATARACT EXTRACTION PHACO AND INTRAOCULAR LENS PLACEMENT (IOC) LEFT DIABETIC  malyugin (Left: Eye)  Patient Location: PACU  Anesthesia Type:MAC  Level of Consciousness: awake and sedated  Airway & Oxygen Therapy: Patient Spontanous Breathing  Post-op Assessment: Report given to RN and Post -op Vital signs reviewed and stable  Post vital signs: Reviewed and stable  Last Vitals:  Vitals Value Taken Time  BP 130/66 02/25/23 0847  Temp    Pulse 58 02/25/23 0848  Resp 11 02/25/23 0848  SpO2 100 % 02/25/23 0848  Vitals shown include unfiled device data.  Last Pain:  Vitals:   02/25/23 0645  TempSrc: Temporal  PainSc: 0-No pain      Patients Stated Pain Goal: 0 (02/25/23 0645)  Complications: No notable events documented.

## 2023-02-25 NOTE — Anesthesia Postprocedure Evaluation (Signed)
Anesthesia Post Note  Patient: Secondary school teacher  Procedure(s) Performed: CATARACT EXTRACTION PHACO AND INTRAOCULAR LENS PLACEMENT (IOC) LEFT DIABETIC  malyugin (Left: Eye)  Patient location during evaluation: PACU Anesthesia Type: MAC Level of consciousness: awake and alert Pain management: pain level controlled Vital Signs Assessment: post-procedure vital signs reviewed and stable Respiratory status: spontaneous breathing, nonlabored ventilation, respiratory function stable and patient connected to nasal cannula oxygen Cardiovascular status: stable and blood pressure returned to baseline Postop Assessment: no apparent nausea or vomiting Anesthetic complications: no   No notable events documented.   Last Vitals:  Vitals:   02/25/23 0900 02/25/23 0910  BP: (!) 162/73   Pulse: (!) 55 (!) 56  Resp: 10 16  Temp:    SpO2: 100% 100%    Last Pain:  Vitals:   02/25/23 0900  TempSrc:   PainSc: 0-No pain                 Ramina Hulet C Casen Pryor

## 2023-02-26 ENCOUNTER — Encounter: Payer: Self-pay | Admitting: Ophthalmology

## 2023-03-04 ENCOUNTER — Ambulatory Visit: Payer: Medicare Other

## 2023-03-17 ENCOUNTER — Ambulatory Visit (INDEPENDENT_AMBULATORY_CARE_PROVIDER_SITE_OTHER): Payer: Medicare Other

## 2023-03-17 DIAGNOSIS — Z23 Encounter for immunization: Secondary | ICD-10-CM

## 2023-03-17 NOTE — Progress Notes (Signed)
Patient reported to clinic in expressed good health for HD flu vaccine and medication reconciliation. Vaccine tolerated well and patient's medication was confirmed with current MAR. Patient had no questions or concerns at time of clinic departure.

## 2023-03-24 DIAGNOSIS — H401113 Primary open-angle glaucoma, right eye, severe stage: Secondary | ICD-10-CM | POA: Diagnosis not present

## 2023-03-24 DIAGNOSIS — E119 Type 2 diabetes mellitus without complications: Secondary | ICD-10-CM | POA: Diagnosis not present

## 2023-04-07 ENCOUNTER — Emergency Department: Payer: Medicare Other

## 2023-04-07 ENCOUNTER — Other Ambulatory Visit: Payer: Self-pay

## 2023-04-07 ENCOUNTER — Inpatient Hospital Stay
Admission: EM | Admit: 2023-04-07 | Discharge: 2023-04-14 | DRG: 641 | Disposition: A | Discharge status: Skilled Nursing Facility | Payer: Medicare Other | Attending: Internal Medicine | Admitting: Internal Medicine

## 2023-04-07 DIAGNOSIS — H5461 Unqualified visual loss, right eye, normal vision left eye: Secondary | ICD-10-CM | POA: Diagnosis present

## 2023-04-07 DIAGNOSIS — R079 Chest pain, unspecified: Secondary | ICD-10-CM | POA: Diagnosis not present

## 2023-04-07 DIAGNOSIS — H409 Unspecified glaucoma: Secondary | ICD-10-CM | POA: Diagnosis present

## 2023-04-07 DIAGNOSIS — Z515 Encounter for palliative care: Secondary | ICD-10-CM | POA: Diagnosis not present

## 2023-04-07 DIAGNOSIS — I82622 Acute embolism and thrombosis of deep veins of left upper extremity: Secondary | ICD-10-CM | POA: Diagnosis not present

## 2023-04-07 DIAGNOSIS — E782 Mixed hyperlipidemia: Secondary | ICD-10-CM | POA: Diagnosis not present

## 2023-04-07 DIAGNOSIS — M7989 Other specified soft tissue disorders: Secondary | ICD-10-CM | POA: Diagnosis not present

## 2023-04-07 DIAGNOSIS — I10 Essential (primary) hypertension: Secondary | ICD-10-CM | POA: Diagnosis not present

## 2023-04-07 DIAGNOSIS — I8290 Acute embolism and thrombosis of unspecified vein: Secondary | ICD-10-CM | POA: Diagnosis not present

## 2023-04-07 DIAGNOSIS — Z961 Presence of intraocular lens: Secondary | ICD-10-CM | POA: Diagnosis present

## 2023-04-07 DIAGNOSIS — Z86718 Personal history of other venous thrombosis and embolism: Secondary | ICD-10-CM | POA: Diagnosis not present

## 2023-04-07 DIAGNOSIS — R6889 Other general symptoms and signs: Secondary | ICD-10-CM | POA: Diagnosis not present

## 2023-04-07 DIAGNOSIS — E440 Moderate protein-calorie malnutrition: Secondary | ICD-10-CM | POA: Insufficient documentation

## 2023-04-07 DIAGNOSIS — Z7984 Long term (current) use of oral hypoglycemic drugs: Secondary | ICD-10-CM

## 2023-04-07 DIAGNOSIS — I5032 Chronic diastolic (congestive) heart failure: Secondary | ICD-10-CM | POA: Diagnosis not present

## 2023-04-07 DIAGNOSIS — E119 Type 2 diabetes mellitus without complications: Secondary | ICD-10-CM

## 2023-04-07 DIAGNOSIS — E1140 Type 2 diabetes mellitus with diabetic neuropathy, unspecified: Secondary | ICD-10-CM | POA: Diagnosis not present

## 2023-04-07 DIAGNOSIS — E876 Hypokalemia: Principal | ICD-10-CM | POA: Diagnosis present

## 2023-04-07 DIAGNOSIS — Z7189 Other specified counseling: Secondary | ICD-10-CM | POA: Diagnosis not present

## 2023-04-07 DIAGNOSIS — K529 Noninfective gastroenteritis and colitis, unspecified: Secondary | ICD-10-CM | POA: Diagnosis present

## 2023-04-07 DIAGNOSIS — F17210 Nicotine dependence, cigarettes, uncomplicated: Secondary | ICD-10-CM | POA: Diagnosis present

## 2023-04-07 DIAGNOSIS — Z7401 Bed confinement status: Secondary | ICD-10-CM | POA: Diagnosis not present

## 2023-04-07 DIAGNOSIS — I160 Hypertensive urgency: Secondary | ICD-10-CM

## 2023-04-07 DIAGNOSIS — Z89422 Acquired absence of other left toe(s): Secondary | ICD-10-CM

## 2023-04-07 DIAGNOSIS — Z7982 Long term (current) use of aspirin: Secondary | ICD-10-CM | POA: Diagnosis not present

## 2023-04-07 DIAGNOSIS — I82C12 Acute embolism and thrombosis of left internal jugular vein: Secondary | ICD-10-CM | POA: Diagnosis not present

## 2023-04-07 DIAGNOSIS — I110 Hypertensive heart disease with heart failure: Secondary | ICD-10-CM | POA: Diagnosis present

## 2023-04-07 DIAGNOSIS — H538 Other visual disturbances: Secondary | ICD-10-CM | POA: Diagnosis not present

## 2023-04-07 DIAGNOSIS — R918 Other nonspecific abnormal finding of lung field: Secondary | ICD-10-CM | POA: Diagnosis not present

## 2023-04-07 DIAGNOSIS — I509 Heart failure, unspecified: Secondary | ICD-10-CM | POA: Diagnosis not present

## 2023-04-07 DIAGNOSIS — Z9842 Cataract extraction status, left eye: Secondary | ICD-10-CM

## 2023-04-07 DIAGNOSIS — Z8249 Family history of ischemic heart disease and other diseases of the circulatory system: Secondary | ICD-10-CM | POA: Diagnosis not present

## 2023-04-07 DIAGNOSIS — E559 Vitamin D deficiency, unspecified: Secondary | ICD-10-CM | POA: Diagnosis not present

## 2023-04-07 DIAGNOSIS — R059 Cough, unspecified: Secondary | ICD-10-CM | POA: Diagnosis present

## 2023-04-07 DIAGNOSIS — R609 Edema, unspecified: Secondary | ICD-10-CM | POA: Diagnosis not present

## 2023-04-07 DIAGNOSIS — Z90710 Acquired absence of both cervix and uterus: Secondary | ICD-10-CM

## 2023-04-07 DIAGNOSIS — F32A Depression, unspecified: Secondary | ICD-10-CM | POA: Diagnosis not present

## 2023-04-07 DIAGNOSIS — R197 Diarrhea, unspecified: Secondary | ICD-10-CM | POA: Diagnosis not present

## 2023-04-07 DIAGNOSIS — J9859 Other diseases of mediastinum, not elsewhere classified: Secondary | ICD-10-CM | POA: Diagnosis not present

## 2023-04-07 DIAGNOSIS — I808 Phlebitis and thrombophlebitis of other sites: Secondary | ICD-10-CM | POA: Diagnosis present

## 2023-04-07 DIAGNOSIS — R410 Disorientation, unspecified: Secondary | ICD-10-CM | POA: Diagnosis not present

## 2023-04-07 DIAGNOSIS — R627 Adult failure to thrive: Secondary | ICD-10-CM | POA: Diagnosis not present

## 2023-04-07 DIAGNOSIS — N3281 Overactive bladder: Secondary | ICD-10-CM | POA: Diagnosis not present

## 2023-04-07 DIAGNOSIS — G238 Other specified degenerative diseases of basal ganglia: Secondary | ICD-10-CM | POA: Diagnosis not present

## 2023-04-07 DIAGNOSIS — R531 Weakness: Secondary | ICD-10-CM | POA: Diagnosis not present

## 2023-04-07 DIAGNOSIS — E8770 Fluid overload, unspecified: Secondary | ICD-10-CM | POA: Diagnosis not present

## 2023-04-07 DIAGNOSIS — Z7902 Long term (current) use of antithrombotics/antiplatelets: Secondary | ICD-10-CM

## 2023-04-07 DIAGNOSIS — R0902 Hypoxemia: Secondary | ICD-10-CM | POA: Diagnosis not present

## 2023-04-07 DIAGNOSIS — Z880 Allergy status to penicillin: Secondary | ICD-10-CM

## 2023-04-07 DIAGNOSIS — Z833 Family history of diabetes mellitus: Secondary | ICD-10-CM

## 2023-04-07 DIAGNOSIS — Z66 Do not resuscitate: Secondary | ICD-10-CM | POA: Diagnosis present

## 2023-04-07 DIAGNOSIS — Z888 Allergy status to other drugs, medicaments and biological substances status: Secondary | ICD-10-CM

## 2023-04-07 DIAGNOSIS — R9431 Abnormal electrocardiogram [ECG] [EKG]: Secondary | ICD-10-CM | POA: Diagnosis present

## 2023-04-07 DIAGNOSIS — Z79899 Other long term (current) drug therapy: Secondary | ICD-10-CM

## 2023-04-07 DIAGNOSIS — F17211 Nicotine dependence, cigarettes, in remission: Secondary | ICD-10-CM | POA: Diagnosis not present

## 2023-04-07 DIAGNOSIS — H54415A Blindness right eye category 5, normal vision left eye: Secondary | ICD-10-CM | POA: Diagnosis not present

## 2023-04-07 DIAGNOSIS — E785 Hyperlipidemia, unspecified: Secondary | ICD-10-CM | POA: Diagnosis present

## 2023-04-07 DIAGNOSIS — R0602 Shortness of breath: Secondary | ICD-10-CM

## 2023-04-07 DIAGNOSIS — R110 Nausea: Secondary | ICD-10-CM

## 2023-04-07 DIAGNOSIS — I672 Cerebral atherosclerosis: Secondary | ICD-10-CM | POA: Diagnosis not present

## 2023-04-07 DIAGNOSIS — Z6820 Body mass index (BMI) 20.0-20.9, adult: Secondary | ICD-10-CM

## 2023-04-07 LAB — CBC WITH DIFFERENTIAL/PLATELET
Abs Immature Granulocytes: 0.08 10*3/uL — ABNORMAL HIGH (ref 0.00–0.07)
Basophils Absolute: 0 10*3/uL (ref 0.0–0.1)
Basophils Relative: 0 %
Eosinophils Absolute: 0 10*3/uL (ref 0.0–0.5)
Eosinophils Relative: 0 %
HCT: 38.3 % (ref 36.0–46.0)
Hemoglobin: 12.5 g/dL (ref 12.0–15.0)
Immature Granulocytes: 1 %
Lymphocytes Relative: 6 %
Lymphs Abs: 0.7 10*3/uL (ref 0.7–4.0)
MCH: 27.8 pg (ref 26.0–34.0)
MCHC: 32.6 g/dL (ref 30.0–36.0)
MCV: 85.1 fL (ref 80.0–100.0)
Monocytes Absolute: 0.7 10*3/uL (ref 0.1–1.0)
Monocytes Relative: 6 %
Neutro Abs: 10.2 10*3/uL — ABNORMAL HIGH (ref 1.7–7.7)
Neutrophils Relative %: 87 %
Platelets: 377 10*3/uL (ref 150–400)
RBC: 4.5 MIL/uL (ref 3.87–5.11)
RDW: 16.2 % — ABNORMAL HIGH (ref 11.5–15.5)
WBC: 11.7 10*3/uL — ABNORMAL HIGH (ref 4.0–10.5)
nRBC: 0 % (ref 0.0–0.2)

## 2023-04-07 LAB — MAGNESIUM: Magnesium: 1.4 mg/dL — ABNORMAL LOW (ref 1.7–2.4)

## 2023-04-07 LAB — COMPREHENSIVE METABOLIC PANEL
ALT: 17 U/L (ref 0–44)
AST: 23 U/L (ref 15–41)
Albumin: 2.7 g/dL — ABNORMAL LOW (ref 3.5–5.0)
Alkaline Phosphatase: 97 U/L (ref 38–126)
Anion gap: 13 (ref 5–15)
BUN: 8 mg/dL (ref 8–23)
CO2: 32 mmol/L (ref 22–32)
Calcium: 7.6 mg/dL — ABNORMAL LOW (ref 8.9–10.3)
Chloride: 93 mmol/L — ABNORMAL LOW (ref 98–111)
Creatinine, Ser: 0.65 mg/dL (ref 0.44–1.00)
GFR, Estimated: >60 mL/min (ref >60)
Glucose, Bld: 175 mg/dL — ABNORMAL HIGH (ref 70–99)
Potassium: 2.1 mmol/L — CL (ref 3.5–5.1)
Sodium: 138 mmol/L (ref 135–145)
Total Bilirubin: 1 mg/dL (ref <1.2)
Total Protein: 6.8 g/dL (ref 6.5–8.1)

## 2023-04-07 LAB — LIPASE, BLOOD: Lipase: 21 U/L (ref 11–51)

## 2023-04-07 LAB — TROPONIN I (HIGH SENSITIVITY): Troponin I (High Sensitivity): 35 ng/L — ABNORMAL HIGH (ref <18)

## 2023-04-07 LAB — BRAIN NATRIURETIC PEPTIDE: B Natriuretic Peptide: 840.1 pg/mL — ABNORMAL HIGH (ref 0.0–100.0)

## 2023-04-07 MED ORDER — LOSARTAN POTASSIUM 50 MG PO TABS
50.0000 mg | ORAL_TABLET | Freq: Once | ORAL | Status: AC
  Administered 2023-04-07: 50 mg via ORAL
  Filled 2023-04-07: qty 1

## 2023-04-07 MED ORDER — LABETALOL HCL 5 MG/ML IV SOLN: 5.0000 mg | Freq: Once | INTRAVENOUS | Status: DC

## 2023-04-07 MED ORDER — METOPROLOL TARTRATE 25 MG PO TABS
25.0000 mg | ORAL_TABLET | Freq: Once | ORAL | Status: AC
  Administered 2023-04-07: 25 mg via ORAL
  Filled 2023-04-07: qty 1

## 2023-04-07 MED ORDER — POTASSIUM CHLORIDE 10 MEQ/100ML IV SOLN
10.0000 meq | INTRAVENOUS | Status: AC
  Administered 2023-04-07 – 2023-04-08 (×2): 10 meq via INTRAVENOUS
  Filled 2023-04-07 (×2): qty 100

## 2023-04-07 MED ORDER — MAGNESIUM SULFATE 2 GM/50ML IV SOLN
2.0000 g | Freq: Once | INTRAVENOUS | Status: AC
  Administered 2023-04-08: 2 g via INTRAVENOUS
  Filled 2023-04-07: qty 50

## 2023-04-07 MED ORDER — HYDRALAZINE HCL 20 MG/ML IJ SOLN
10.0000 mg | Freq: Once | INTRAMUSCULAR | Status: AC
  Administered 2023-04-08: 10 mg via INTRAVENOUS
  Filled 2023-04-07: qty 1

## 2023-04-07 MED ORDER — POTASSIUM CHLORIDE CRYS ER 20 MEQ PO TBCR
40.0000 meq | EXTENDED_RELEASE_TABLET | Freq: Once | ORAL | Status: AC
  Administered 2023-04-08: 40 meq via ORAL
  Filled 2023-04-07: qty 2

## 2023-04-07 NOTE — ED Provider Notes (Signed)
[facility] Provider Note    Event Date/Time   First MD Initiated Contact with Patient 04/07/23 2140     (approximate)   History   Nausea   HPI Lisa [name] is a 77 y.o. female with history of HTN, HLD, DM2, CHF presenting today for nausea.  Patient states over the past 2 to 3 days she has felt generally unwell.  She has a hard time describing it but states she has had some nausea and just feeling weak.  She is just wanted to sleep the whole time.  She denies chest pain, shortness of breath, congestion, abdominal pain, vomiting.  Reportedly has been having bowel movements and urinating a lot.  Also had a nonproductive cough.  Otherwise denies any weakness or numbness anywhere.  Separately, does have some swelling to her left upper extremity which is new for her.  Reviewed recent chart notes.     Physical Exam   Triage Vital Signs: ED Triage Vitals  Encounter Vitals Group     BP      Systolic BP Percentile      Diastolic BP Percentile      Pulse      Resp      Temp      Temp src      SpO2      Weight      Height      Head Circumference      Peak Flow      Pain Score      Pain Loc      Pain Education      Exclude from Growth Chart     Most recent vital signs: Vitals:   04/07/23 2215 04/07/23 2230  BP:  (!) 230/178  Pulse: 78 81  Resp: 20   Temp:    SpO2: 99% 100%    Physical Exam: I have reviewed the vital signs and nursing notes. General: Awake, alert, no acute distress.  Chronically fatigued appearing. Head:  Atraumatic, normocephalic.   ENT:  EOM intact, PERRL. Oral mucosa is pink and moist with no lesions. Neck: Neck is supple with full range of motion, No meningeal signs. Cardiovascular:  RRR, No murmurs. Peripheral pulses palpable and equal bilaterally. Respiratory:  Symmetrical chest wall expansion.  No rhonchi, rales, or wheezes.  Good air movement throughout.  No use of accessory muscles.   Musculoskeletal:  No cyanosis.   1+ pitting edema to left upper extremity.  Moving extremities with full ROM Abdomen:  Soft, nontender, nondistended. Neuro:  GCS 15, moving all four extremities, interacting appropriately. Speech clear. Psych:  Calm, appropriate.   Skin:  Warm, dry, no rash.    ED Results / Procedures / Treatments   Labs (all labs ordered are listed, but only abnormal results are displayed) Labs Reviewed  CBC WITH DIFFERENTIAL/PLATELET - Abnormal; Notable for the following components:      Result Value   WBC 11.7 (*)    RDW 16.2 (*)    Neutro Abs 10.2 (*)    Abs Immature Granulocytes 0.08 (*)    All other components within normal limits  COMPREHENSIVE METABOLIC PANEL - Abnormal; Notable for the following components:   Potassium 2.1 (*)    Chloride 93 (*)    Glucose, Bld 175 (*)    Calcium 7.6 (*)    Albumin 2.7 (*)    All other components within normal limits  BRAIN NATRIURETIC PEPTIDE - Abnormal; Notable for the following components:   B  Natriuretic Peptide 840.1 (*)    All other components within normal limits  TROPONIN I (HIGH SENSITIVITY) - Abnormal; Notable for the following components:   Troponin I (High Sensitivity) 35 (*)    All other components within normal limits  RESP PANEL BY RT-PCR (RSV, FLU A&B, COVID)  [id number]  LIPASE, BLOOD  URINALYSIS, ROUTINE W REFLEX MICROSCOPIC  MAGNESIUM     EKG My EKG interpretation: Rate of 77, normal sinus rhythm, normal axis, normal intervals.  No acute ST elevations or depressions   RADIOLOGY Chest x-ray, CT head, and ultrasound of left upper extremity pending at time of signout   PROCEDURES:  Critical Care performed: No  Procedures   MEDICATIONS ORDERED IN ED: Medications  potassium chloride 10 mEq in 100 mL IVPB (has no administration in time range)  losartan (COZAAR) tablet 50 mg (50 mg Oral Given 04/07/23 2219)  metoprolol tartrate (LOPRESSOR) tablet 25 mg (25 mg Oral Given 04/07/23 2219)     IMPRESSION / MDM / ASSESSMENT AND  PLAN / ED COURSE  I reviewed the triage vital signs and the nursing notes.                              Differential diagnosis includes, but is not limited to, dehydration, electrolyte abnormality, viral URI, UTI  Patient's presentation is most consistent with acute complicated illness / injury requiring diagnostic workup.  Patient is a 77 year old female presenting today for generalized malaise x 2 to 3 days associated with intermittent nausea, polyuria, and diarrhea.  Vital signs most notable for high blood pressure 250/104.  Patient notes she has not taken any of her medications over the past 2 days including her blood pressure meds.  Physical exam only notable for pitting edema to left upper extremity which is apparently new.  Patient was given her home blood pressure medications first to treat her high blood pressure.  Will evaluate with laboratory workup, chest x-ray, ultrasound of left upper extremity, CT head.  Initial laboratory workup notable for profound hypokalemia at 2.1.  Repleted with IV potassium.  Slightly elevated troponin at 35 although does have a history of similar levels.  Will repeat a second 1.  Noticeable elevated BNP at 840 which could be representative of the swelling on her upper extremity.  Patient was signed out pending completion of workup.  Likely suspect admission with already known profound hypokalemia, elevated BNP, and profound hypertension.  The patient is on the cardiac monitor to evaluate for evidence of arrhythmia and/or significant heart rate changes. Clinical Course as of 04/07/23 2310  Tue Apr 07, 2023  2207 BP(!): 250/104 Has not taken her home blood pressure medications for the past 2 days.  Will start with this before any additional IV medications. [DW]  2243 Potassium(!!): 2.1 Giving IV repletion at this time. [DW]    Clinical Course User Index [DW] Janith Lima, MD     FINAL CLINICAL IMPRESSION(S) / ED DIAGNOSES   Final diagnoses:   Hypokalemia  Nausea     Rx / DC Orders   ED Discharge Orders     None        Note:  This document was prepared using Dragon voice recognition software and may include unintentional dictation errors.   Janith Lima, MD 04/07/23 [phone]

## 2023-04-07 NOTE — ED Triage Notes (Signed)
Pt here from home, pt c/o N/V and urinary frequency. Pt also c/o weakness. CBG 74 per ems.

## 2023-04-07 NOTE — Progress Notes (Deleted)
04/09/2023 2:59 PM   Lisa [name] 10/18/1945 [id number]  Referring provider: Alba Cory, MD [address] [place],  [place] [address]  Urological history: 1. Urge incontinence -Contributing factors of age, GSM, diabetes, hypertension, neuropathy, depression, amputation of left foot resulting in ambulation difficulties and smoking -cysto (03/2022) - NED   2. Cystocele -pelvic floor laxity and hysterectomy   No chief complaint on file.  HPI: Lisa [name] is a 77 y.o. female who presents today for enuresis, urge incontinence and OAB  Previous records reviewed.   PVR ***  PMH: Past Medical History:  Diagnosis Date   Blindness of right eye    CHF (congestive heart failure) (HCC)    Depression    Diabetes mellitus without complication (HCC)    DVT of leg (deep venous thrombosis) (HCC) 2011   Glaucoma    Grade II diastolic dysfunction    Hyperlipidemia    Hypertension    Vascular disease     Surgical History: Past Surgical History:  Procedure Laterality Date   ABDOMINAL HYSTERECTOMY  1992   Total   CATARACT EXTRACTION W/PHACO Left 02/25/2023   Procedure: CATARACT EXTRACTION PHACO AND INTRAOCULAR LENS PLACEMENT (IOC) LEFT DIABETIC  malyugin;  Surgeon: Lockie Mola, MD;  Location: [facility];  Service: Ophthalmology;  Laterality: Left;  10.78 0:49.1   EYE SURGERY     TOE AMPUTATION Left 2011   All of her toes on the left foot   VASCULAR SURGERY      Home Medications:  Allergies as of 04/09/2023       Reactions   Other Itching   Nucinta - Hallucinations   Tapentadol Hcl Itching   Hallucinations   Actos [pioglitazone]    History of CHF   Penicillins Rash        Medication List        Accurate as of April 07, 2023  2:59 PM. If you have any questions, ask your nurse or doctor.          aspirin 81 MG tablet Take 81 mg by mouth daily.   atropine 1 % ophthalmic solution Place 1 drop into the right eye 3  (three) times daily.   B-12 1000 MCG Subl Place 1 tablet under the tongue daily.   brimonidine 0.2 % ophthalmic solution Commonly known as: ALPHAGAN Place 1 drop into both eyes 2 (two) times daily.   citalopram 10 MG tablet Commonly known as: CELEXA Take 1 tablet (10 mg total) by mouth daily.   clopidogrel 75 MG tablet Commonly known as: PLAVIX Take 1 tablet (75 mg total) by mouth daily.   dapagliflozin propanediol 10 MG Tabs tablet Commonly known as: Farxiga TAKE 1 TABLET BY MOUTH EVERY DAY BEFORE BREAKFAST   dorzolamide-timolol 2-0.5 % ophthalmic solution Commonly known as: COSOPT SMARTSIG:In Eye(s)   Gemtesa 75 MG Tabs Generic drug: Vibegron Take 75 mg by mouth daily.   losartan 50 MG tablet Commonly known as: COZAAR Take 1 tablet (50 mg total) by mouth daily.   Metamucil 0.52 g capsule Generic drug: psyllium Take 1 capsule (0.52 g total) by mouth daily.   metoprolol succinate 25 MG 24 hr tablet Commonly known as: Toprol XL Take 1 tablet (25 mg total) by mouth daily. Take with or immediately following a meal.   moxifloxacin 0.5 % ophthalmic solution Commonly known as: VIGAMOX Apply to eye.   multivitamin tablet Take 1 tablet by mouth daily.   nitroGLYCERIN 0.4 MG SL tablet Commonly known as: NITROSTAT  Place 1 tablet (0.4 mg total) under the tongue every 5 (five) minutes as needed for chest pain.   potassium chloride SA 20 MEQ tablet Commonly known as: Klor-Con M20 Take 1 tablet (20 mEq total) by mouth 2 (two) times daily. But three times a day for the next few days and return for labs   prednisoLONE acetate 1 % ophthalmic suspension Commonly known as: PRED FORTE Place into the right eye.   rosuvastatin 10 MG tablet Commonly known as: CRESTOR Take 1 tablet (10 mg total) by mouth daily.   sitaGLIPtin 100 MG tablet Commonly known as: Januvia Take 1 tablet (100 mg total) by mouth daily.   Vitamin D (Ergocalciferol) 1.25 MG (50000 UNIT) Caps  capsule Commonly known as: DRISDOL TAKE 1 CAPSULE (50,000 UNITS TOTAL) BY MOUTH EVERY 7 (SEVEN) DAYS        Allergies:  Allergies  Allergen Reactions   Other Itching    Nucinta - Hallucinations   Tapentadol Hcl Itching    Hallucinations   Actos [Pioglitazone]     History of CHF   Penicillins Rash    Family History: Family History  Problem Relation Age of Onset   Heart disease Father    Diabetes Mother    Diabetes Brother    Kidney disease Brother        Transplant   Heart disease Brother    Diabetes Sister    Diabetes Sister     Social History:  reports that she has been smoking cigarettes. She started smoking about 23 years ago. She has a 10 pack-year smoking history. She has never used smokeless tobacco. She reports that she does not drink alcohol and does not use drugs.  ROS: Pertinent ROS in HPI  Physical Exam: There were no vitals taken for this visit.  Constitutional:  Well nourished. Alert and oriented, No acute distress. HEENT: [place] AT, moist mucus membranes.  Trachea midline, no masses. Cardiovascular: No clubbing, cyanosis, or edema. Respiratory: Normal respiratory effort, no increased work of breathing. GU: No CVA tenderness.  No bladder fullness or masses.  Recession of labia minora, dry, pale vulvar vaginal mucosa and loss of mucosal ridges and folds.  Normal urethral meatus, no lesions, no prolapse, no discharge.   No urethral masses, tenderness and/or tenderness. No bladder fullness, tenderness or masses. *** vagina mucosa, *** estrogen effect, no discharge, no lesions, *** pelvic support, *** cystocele and *** rectocele noted.  No cervical motion tenderness.  Uterus is freely mobile and non-fixed.  No adnexal/parametria masses or tenderness noted.  Anus and perineum are without rashes or lesions.   ***  Neurologic: Grossly intact, no focal deficits, moving all 4 extremities. Psychiatric: Normal mood and affect.    Laboratory Data: Lab Results  Component  Value Date   WBC 5.4 02/03/2023   HGB 12.4 02/03/2023   HCT 39.5 02/03/2023   MCV 87.8 02/03/2023   PLT 253 02/03/2023    Lab Results  Component Value Date   CREATININE 0.77 02/03/2023    Lab Results  Component Value Date   HGBA1C 6.4 (A) 01/30/2023       Component Value Date/Time   CHOL 129 02/03/2023 1352   CHOL 143 09/25/2015 1620   HDL 60 02/03/2023 1352   HDL 64 09/25/2015 1620   CHOLHDL 2.2 02/03/2023 1352   VLDL 19 10/14/2016 1613   LDLCALC 52 02/03/2023 1352    Lab Results  Component Value Date   AST 18 02/03/2023   Lab Results  Component  Value Date   ALT 14 02/03/2023  I have reviewed the labs.   Pertinent Imaging: ***  Assessment & Plan:  ***  1. Urge incontinence ***  2. Cystocele ***  3. Enuresis ***  There are no diagnoses linked to this encounter.  No follow-ups on file.  These notes generated with voice recognition software. I apologize for typographical errors.  Cloretta Ned  [facility] [address]  [address] [place], [place] [address] [phone]

## 2023-04-08 ENCOUNTER — Emergency Department: Payer: Medicare Other

## 2023-04-08 ENCOUNTER — Encounter: Payer: Self-pay | Admitting: Internal Medicine

## 2023-04-08 ENCOUNTER — Other Ambulatory Visit (HOSPITAL_COMMUNITY): Payer: Self-pay

## 2023-04-08 DIAGNOSIS — R9431 Abnormal electrocardiogram [ECG] [EKG]: Secondary | ICD-10-CM | POA: Diagnosis present

## 2023-04-08 DIAGNOSIS — Z7984 Long term (current) use of oral hypoglycemic drugs: Secondary | ICD-10-CM | POA: Diagnosis not present

## 2023-04-08 DIAGNOSIS — F17210 Nicotine dependence, cigarettes, uncomplicated: Secondary | ICD-10-CM | POA: Diagnosis present

## 2023-04-08 DIAGNOSIS — I5032 Chronic diastolic (congestive) heart failure: Secondary | ICD-10-CM

## 2023-04-08 DIAGNOSIS — Z66 Do not resuscitate: Secondary | ICD-10-CM

## 2023-04-08 DIAGNOSIS — I160 Hypertensive urgency: Secondary | ICD-10-CM | POA: Diagnosis present

## 2023-04-08 DIAGNOSIS — I82C12 Acute embolism and thrombosis of left internal jugular vein: Secondary | ICD-10-CM | POA: Diagnosis present

## 2023-04-08 DIAGNOSIS — E785 Hyperlipidemia, unspecified: Secondary | ICD-10-CM

## 2023-04-08 DIAGNOSIS — I8290 Acute embolism and thrombosis of unspecified vein: Secondary | ICD-10-CM | POA: Diagnosis not present

## 2023-04-08 DIAGNOSIS — I82622 Acute embolism and thrombosis of deep veins of left upper extremity: Secondary | ICD-10-CM | POA: Diagnosis present

## 2023-04-08 DIAGNOSIS — Z86718 Personal history of other venous thrombosis and embolism: Secondary | ICD-10-CM | POA: Diagnosis not present

## 2023-04-08 DIAGNOSIS — Z880 Allergy status to penicillin: Secondary | ICD-10-CM | POA: Diagnosis not present

## 2023-04-08 DIAGNOSIS — Z961 Presence of intraocular lens: Secondary | ICD-10-CM | POA: Diagnosis present

## 2023-04-08 DIAGNOSIS — E782 Mixed hyperlipidemia: Secondary | ICD-10-CM | POA: Diagnosis not present

## 2023-04-08 DIAGNOSIS — E440 Moderate protein-calorie malnutrition: Secondary | ICD-10-CM | POA: Insufficient documentation

## 2023-04-08 DIAGNOSIS — E1140 Type 2 diabetes mellitus with diabetic neuropathy, unspecified: Secondary | ICD-10-CM | POA: Diagnosis not present

## 2023-04-08 DIAGNOSIS — R918 Other nonspecific abnormal finding of lung field: Secondary | ICD-10-CM | POA: Diagnosis not present

## 2023-04-08 DIAGNOSIS — N3281 Overactive bladder: Secondary | ICD-10-CM | POA: Diagnosis not present

## 2023-04-08 DIAGNOSIS — I509 Heart failure, unspecified: Secondary | ICD-10-CM | POA: Diagnosis not present

## 2023-04-08 DIAGNOSIS — K529 Noninfective gastroenteritis and colitis, unspecified: Secondary | ICD-10-CM | POA: Diagnosis present

## 2023-04-08 DIAGNOSIS — H409 Unspecified glaucoma: Secondary | ICD-10-CM | POA: Diagnosis present

## 2023-04-08 DIAGNOSIS — E876 Hypokalemia: Secondary | ICD-10-CM | POA: Diagnosis present

## 2023-04-08 DIAGNOSIS — Z515 Encounter for palliative care: Secondary | ICD-10-CM | POA: Diagnosis not present

## 2023-04-08 DIAGNOSIS — F17211 Nicotine dependence, cigarettes, in remission: Secondary | ICD-10-CM | POA: Diagnosis not present

## 2023-04-08 DIAGNOSIS — E119 Type 2 diabetes mellitus without complications: Secondary | ICD-10-CM | POA: Diagnosis present

## 2023-04-08 DIAGNOSIS — Z7189 Other specified counseling: Secondary | ICD-10-CM | POA: Diagnosis not present

## 2023-04-08 DIAGNOSIS — Z7982 Long term (current) use of aspirin: Secondary | ICD-10-CM | POA: Diagnosis not present

## 2023-04-08 DIAGNOSIS — R531 Weakness: Secondary | ICD-10-CM | POA: Diagnosis not present

## 2023-04-08 DIAGNOSIS — R059 Cough, unspecified: Secondary | ICD-10-CM | POA: Diagnosis present

## 2023-04-08 DIAGNOSIS — Z7401 Bed confinement status: Secondary | ICD-10-CM | POA: Diagnosis not present

## 2023-04-08 DIAGNOSIS — Z89422 Acquired absence of other left toe(s): Secondary | ICD-10-CM | POA: Diagnosis not present

## 2023-04-08 DIAGNOSIS — R6889 Other general symptoms and signs: Secondary | ICD-10-CM | POA: Diagnosis not present

## 2023-04-08 DIAGNOSIS — I110 Hypertensive heart disease with heart failure: Secondary | ICD-10-CM | POA: Diagnosis present

## 2023-04-08 DIAGNOSIS — R627 Adult failure to thrive: Secondary | ICD-10-CM | POA: Diagnosis not present

## 2023-04-08 DIAGNOSIS — H5461 Unqualified visual loss, right eye, normal vision left eye: Secondary | ICD-10-CM | POA: Diagnosis present

## 2023-04-08 DIAGNOSIS — Z8249 Family history of ischemic heart disease and other diseases of the circulatory system: Secondary | ICD-10-CM | POA: Diagnosis not present

## 2023-04-08 DIAGNOSIS — I808 Phlebitis and thrombophlebitis of other sites: Secondary | ICD-10-CM | POA: Diagnosis present

## 2023-04-08 DIAGNOSIS — E559 Vitamin D deficiency, unspecified: Secondary | ICD-10-CM | POA: Diagnosis not present

## 2023-04-08 DIAGNOSIS — F32A Depression, unspecified: Secondary | ICD-10-CM | POA: Diagnosis not present

## 2023-04-08 DIAGNOSIS — H54415A Blindness right eye category 5, normal vision left eye: Secondary | ICD-10-CM | POA: Diagnosis not present

## 2023-04-08 DIAGNOSIS — E8770 Fluid overload, unspecified: Secondary | ICD-10-CM | POA: Diagnosis not present

## 2023-04-08 DIAGNOSIS — J9859 Other diseases of mediastinum, not elsewhere classified: Secondary | ICD-10-CM | POA: Diagnosis not present

## 2023-04-08 LAB — CBC
HCT: 32.1 % — ABNORMAL LOW (ref 36.0–46.0)
Hemoglobin: 10.7 g/dL — ABNORMAL LOW (ref 12.0–15.0)
MCH: 27.9 pg (ref 26.0–34.0)
MCHC: 33.3 g/dL (ref 30.0–36.0)
MCV: 83.6 fL (ref 80.0–100.0)
Platelets: 350 10*3/uL (ref 150–400)
RBC: 3.84 MIL/uL — ABNORMAL LOW (ref 3.87–5.11)
RDW: 16.3 % — ABNORMAL HIGH (ref 11.5–15.5)
WBC: 8.8 10*3/uL (ref 4.0–10.5)
nRBC: 0 % (ref 0.0–0.2)

## 2023-04-08 LAB — BASIC METABOLIC PANEL
Anion gap: 12 (ref 5–15)
BUN: 7 mg/dL — ABNORMAL LOW (ref 8–23)
CO2: 29 mmol/L (ref 22–32)
Calcium: 6.8 mg/dL — ABNORMAL LOW (ref 8.9–10.3)
Chloride: 95 mmol/L — ABNORMAL LOW (ref 98–111)
Creatinine, Ser: 0.45 mg/dL (ref 0.44–1.00)
GFR, Estimated: >60 mL/min (ref >60)
Glucose, Bld: 135 mg/dL — ABNORMAL HIGH (ref 70–99)
Potassium: 2.2 mmol/L — CL (ref 3.5–5.1)
Sodium: 136 mmol/L (ref 135–145)

## 2023-04-08 LAB — FOLATE: Folate: 28 ng/mL (ref >5.9)

## 2023-04-08 LAB — APTT: aPTT: 37 s — ABNORMAL HIGH (ref 24–36)

## 2023-04-08 LAB — PROTIME-INR
INR: 1.2 (ref 0.8–1.2)
Prothrombin Time: 15 s (ref 11.4–15.2)

## 2023-04-08 MED ORDER — MIRABEGRON ER 25 MG PO TB24
25.0000 mg | ORAL_TABLET | Freq: Every day | ORAL | Status: DC
  Administered 2023-04-09 – 2023-04-14 (×6): 25 mg via ORAL
  Filled 2023-04-08 (×7): qty 1

## 2023-04-08 MED ORDER — ENSURE ENLIVE PO LIQD
237.0000 mL | Freq: Two times a day (BID) | ORAL | Status: DC
  Administered 2023-04-09 – 2023-04-14 (×9): 237 mL via ORAL

## 2023-04-08 MED ORDER — POTASSIUM CHLORIDE 10 MEQ/100ML IV SOLN
10.0000 meq | INTRAVENOUS | Status: AC
  Administered 2023-04-08 (×2): 10 meq via INTRAVENOUS
  Filled 2023-04-08 (×2): qty 100

## 2023-04-08 MED ORDER — POTASSIUM CHLORIDE CRYS ER 20 MEQ PO TBCR: 20.0000 meq | EXTENDED_RELEASE_TABLET | Freq: Two times a day (BID) | ORAL | Status: DC

## 2023-04-08 MED ORDER — POTASSIUM CHLORIDE IN NACL 20-0.9 MEQ/L-% IV SOLN
INTRAVENOUS | Status: AC
  Filled 2023-04-08 (×2): qty 1000

## 2023-04-08 MED ORDER — VITAMIN D (ERGOCALCIFEROL) 1.25 MG (50000 UNIT) PO CAPS
50000.0000 [IU] | ORAL_CAPSULE | ORAL | Status: DC
  Administered 2023-04-09: 50000 [IU] via ORAL
  Filled 2023-04-08: qty 1

## 2023-04-08 MED ORDER — METOPROLOL SUCCINATE ER 25 MG PO TB24
25.0000 mg | ORAL_TABLET | Freq: Every day | ORAL | Status: DC
  Administered 2023-04-08 – 2023-04-09 (×2): 25 mg via ORAL
  Filled 2023-04-08 (×2): qty 1

## 2023-04-08 MED ORDER — ADULT MULTIVITAMIN W/MINERALS CH
1.0000 | ORAL_TABLET | Freq: Every day | ORAL | Status: DC
  Administered 2023-04-08 – 2023-04-14 (×7): 1 via ORAL
  Filled 2023-04-08 (×7): qty 1

## 2023-04-08 MED ORDER — ATROPINE SULFATE 1 % OP SOLN
1.0000 [drp] | Freq: Three times a day (TID) | OPHTHALMIC | Status: DC
Start: 2023-04-08 — End: 2023-04-08
  Filled 2023-04-08: qty 2

## 2023-04-08 MED ORDER — HYDRALAZINE HCL 20 MG/ML IJ SOLN
10.0000 mg | Freq: Four times a day (QID) | INTRAMUSCULAR | Status: DC | PRN
  Administered 2023-04-08 – 2023-04-09 (×3): 10 mg via INTRAVENOUS
  Filled 2023-04-08 (×3): qty 1

## 2023-04-08 MED ORDER — LINAGLIPTIN 5 MG PO TABS
5.0000 mg | ORAL_TABLET | Freq: Every day | ORAL | Status: DC
  Administered 2023-04-08 – 2023-04-14 (×7): 5 mg via ORAL
  Filled 2023-04-08 (×7): qty 1

## 2023-04-08 MED ORDER — CITALOPRAM HYDROBROMIDE 20 MG PO TABS
10.0000 mg | ORAL_TABLET | Freq: Every day | ORAL | Status: DC
  Administered 2023-04-08 – 2023-04-14 (×7): 10 mg via ORAL
  Filled 2023-04-08 (×7): qty 1

## 2023-04-08 MED ORDER — ASPIRIN 81 MG PO TBEC
81.0000 mg | DELAYED_RELEASE_TABLET | Freq: Every day | ORAL | Status: DC
Start: 2023-04-08 — End: 2023-04-14
  Administered 2023-04-08 – 2023-04-14 (×7): 81 mg via ORAL
  Filled 2023-04-08 (×7): qty 1

## 2023-04-08 MED ORDER — POTASSIUM CHLORIDE CRYS ER 20 MEQ PO TBCR
40.0000 meq | EXTENDED_RELEASE_TABLET | Freq: Two times a day (BID) | ORAL | Status: DC
  Administered 2023-04-08 – 2023-04-11 (×7): 40 meq via ORAL
  Filled 2023-04-08 (×7): qty 2

## 2023-04-08 MED ORDER — VITAMIN B-12 1000 MCG PO TABS
1000.0000 ug | ORAL_TABLET | Freq: Every day | ORAL | Status: DC
  Administered 2023-04-08 – 2023-04-14 (×7): 1000 ug via ORAL
  Filled 2023-04-08 (×4): qty 1
  Filled 2023-04-08 (×2): qty 10
  Filled 2023-04-08: qty 1
  Filled 2023-04-08: qty 10

## 2023-04-08 MED ORDER — NITROGLYCERIN 0.4 MG SL SUBL: 0.4000 mg | SUBLINGUAL_TABLET | SUBLINGUAL | Status: DC | PRN

## 2023-04-08 MED ORDER — DORZOLAMIDE HCL-TIMOLOL MAL 2-0.5 % OP SOLN
1.0000 [drp] | Freq: Two times a day (BID) | OPHTHALMIC | Status: DC
Start: 2023-04-08 — End: 2023-04-14
  Administered 2023-04-08 – 2023-04-14 (×13): 1 [drp] via OPHTHALMIC
  Filled 2023-04-08: qty 10

## 2023-04-08 MED ORDER — IOHEXOL 350 MG/ML SOLN
60.0000 mL | Freq: Once | INTRAVENOUS | Status: AC | PRN
  Administered 2023-04-08: 60 mL via INTRAVENOUS

## 2023-04-08 MED ORDER — PSYLLIUM 0.52 G PO CAPS: 0.5200 g | ORAL_CAPSULE | Freq: Every day | ORAL | Status: DC

## 2023-04-08 MED ORDER — ROSUVASTATIN CALCIUM 10 MG PO TABS
10.0000 mg | ORAL_TABLET | Freq: Every day | ORAL | Status: DC
  Administered 2023-04-08 – 2023-04-14 (×7): 10 mg via ORAL
  Filled 2023-04-08 (×7): qty 1

## 2023-04-08 MED ORDER — METOCLOPRAMIDE HCL 5 MG/ML IJ SOLN
5.0000 mg | Freq: Four times a day (QID) | INTRAMUSCULAR | Status: DC | PRN
  Administered 2023-04-13: 5 mg via INTRAVENOUS
  Filled 2023-04-08: qty 2

## 2023-04-08 MED ORDER — LOSARTAN POTASSIUM 50 MG PO TABS
50.0000 mg | ORAL_TABLET | Freq: Every day | ORAL | Status: DC
  Administered 2023-04-08 – 2023-04-09 (×2): 50 mg via ORAL
  Filled 2023-04-08 (×2): qty 1

## 2023-04-08 MED ORDER — ACETAMINOPHEN 650 MG RE SUPP: 650.0000 mg | Freq: Four times a day (QID) | RECTAL | Status: DC | PRN

## 2023-04-08 MED ORDER — TRAZODONE HCL 50 MG PO TABS
25.0000 mg | ORAL_TABLET | Freq: Every evening | ORAL | Status: DC | PRN
  Administered 2023-04-13: 25 mg via ORAL
  Filled 2023-04-08: qty 1

## 2023-04-08 MED ORDER — CLOPIDOGREL BISULFATE 75 MG PO TABS
75.0000 mg | ORAL_TABLET | Freq: Every day | ORAL | Status: DC
Start: 2023-04-08 — End: 2023-04-14
  Administered 2023-04-08 – 2023-04-14 (×7): 75 mg via ORAL
  Filled 2023-04-08 (×7): qty 1

## 2023-04-08 MED ORDER — ACETAMINOPHEN 325 MG PO TABS: 650.0000 mg | ORAL_TABLET | Freq: Four times a day (QID) | ORAL | Status: DC | PRN

## 2023-04-08 MED ORDER — ATROPINE SULFATE 1 % OP SOLN
1.0000 [drp] | Freq: Three times a day (TID) | OPHTHALMIC | Status: DC
  Administered 2023-04-08 – 2023-04-14 (×17): 1 [drp] via OPHTHALMIC
  Filled 2023-04-08 (×2): qty 2

## 2023-04-08 MED ORDER — PREDNISOLONE ACETATE 1 % OP SUSP
1.0000 [drp] | Freq: Four times a day (QID) | OPHTHALMIC | Status: DC
  Administered 2023-04-08 – 2023-04-14 (×22): 1 [drp] via OPHTHALMIC
  Filled 2023-04-08: qty 1

## 2023-04-08 MED ORDER — GATIFLOXACIN 0.5 % OP SOLN
1.0000 [drp] | Freq: Four times a day (QID) | OPHTHALMIC | Status: DC
  Filled 2023-04-08: qty 2.5

## 2023-04-08 MED ORDER — HEPARIN (PORCINE) 25000 UT/250ML-% IV SOLN
850.0000 [IU]/h | INTRAVENOUS | Status: DC
  Administered 2023-04-08: 850 [IU]/h via INTRAVENOUS
  Filled 2023-04-08: qty 250

## 2023-04-08 MED ORDER — PREDNISOLONE ACETATE 1 % OP SUSP: 1.0000 [drp] | OPHTHALMIC | Status: DC

## 2023-04-08 MED ORDER — DAPAGLIFLOZIN PROPANEDIOL 10 MG PO TABS
10.0000 mg | ORAL_TABLET | Freq: Every day | ORAL | Status: DC
  Administered 2023-04-08 – 2023-04-14 (×7): 10 mg via ORAL
  Filled 2023-04-08 (×8): qty 1

## 2023-04-08 MED ORDER — PSYLLIUM 95 % PO PACK
1.0000 | PACK | Freq: Every day | ORAL | Status: DC
  Administered 2023-04-09 – 2023-04-14 (×5): 1 via ORAL
  Filled 2023-04-08 (×6): qty 1

## 2023-04-08 MED ORDER — BRIMONIDINE TARTRATE 0.2 % OP SOLN
1.0000 [drp] | Freq: Two times a day (BID) | OPHTHALMIC | Status: DC
  Administered 2023-04-08 – 2023-04-14 (×13): 1 [drp] via OPHTHALMIC
  Filled 2023-04-08: qty 5

## 2023-04-08 MED ORDER — HEPARIN BOLUS VIA INFUSION
3300.0000 [IU] | Freq: Once | INTRAVENOUS | Status: AC
Start: 2023-04-08 — End: 2023-04-08
  Administered 2023-04-08: 3300 [IU] via INTRAVENOUS
  Filled 2023-04-08: qty 3300

## 2023-04-08 NOTE — ED Notes (Signed)
Pt cleaned of incontinence

## 2023-04-08 NOTE — Progress Notes (Signed)
Brief hospitalist update note.  This is a nonbillable note.  Please see same-day H&P for full billable details.  This is a 77 year old female admitted to the hospitalist service for management and evaluation of progressive nausea, weakness, poor p.o. tolerance, persistent loose stools.  No patient is been seen in the past for similar circumstances.  This is a largely nutritional/functional in nature.  Will optimize electrolytes.  Consult PT and OT.  Consult RD for dietary recommendations.  Consult palliative care for GOC discussion.  Lolita Patella MD  No charge

## 2023-04-08 NOTE — Consult Note (Signed)
Consultation Note Date: 04/08/2023   Patient Name: Lisa [name]  DOB: 07/17/1945  MRN: [id number]  Age / Sex: 77 y.o., female  PCP: Alba Cory, MD Referring Physician: Tresa Moore, MD  Reason for Consultation: Establishing goals of care  HPI/Patient Profile: 77 y.o. female  admitted on 04/07/2023 with   medical history significant for CHF, type 2 diabetes mellitus, lower extremity DVT, depression, hypertension, dyslipidemia, grade 2 diastolic dysfunction and right eye blindness, who presented to the emergency room with acute onset of nausea and generalized weakness over the last 2 to 3 days.    She has been fairly sleepy all the time.  No chest pain or palpitations.  She has been having nonproductive cough with occasional wheezing without dyspnea.  She has been having urinary frequency and frequent watery bowel movements.  No paresthesias or focal muscle weakness.  She has been having swelling of the left upper extremity that is new for her with associated pain for the last 3 days.  No dysuria, oliguria or hematuria or flank pain.  No bleeding diathesis.   ED Course: When she came to the ER, BP was elevated 250/104 with otherwise normal vital signs.  Labs revealed severe hypokalemia of 2.1 and hypomagnesemia 1.4.  Blood glucose was 175 with albumin of 2.7 otherwise unremarkable CMP.  BNP was 840.1 and high sensitive troponin was 35.  CBC showed mild leukocytosis of 11.7 with neutrophilia.  Imaging: Portable chest x-ray showed stable cardiomegaly and mild left basilar atelectasis and/or infiltrate.  Chest CTA is currently pending. Left upper extremity venous Doppler revealed: 1. Nonocclusive DVT within the LEFT internal jugular vein. 2. Occlusive superficial thrombophlebitis involving the LEFT basilic vein.   Patient faces treatment option decision, advanced directive decision and anticipatory  care needs.   Clinical Assessment and Goals of Care:   This NP Lorinda Creed reviewed medical records, received report from team, assessed the patient and then meet at the patient's bedside  to discuss diagnosis, prognosis, GOC, EOL wishes disposition and options.   Concept of Palliative Care was introduced as specialized medical care for people and their families living with serious illness.  If focuses on providing relief from the symptoms and stress of a serious illness.  The goal is to improve quality of life for both the patient and the family.  Values and goals of care important to patient and family were attempted to be elicited.  Created space and opportunity for patient to explore thoughts and feelings regarding current medical situation  Patient tells me that she has had continued physical and functional decline over the past many months.  It is becoming more difficult for her in her home.  She is inquiring about in-home care services.  She lives with her brother but he is not helpful in assisting her with personal care   Her brother Derrill Center is who she is me is her best support person.  She does have several other siblings.  With patient's permission I spoke to Numa by phone.  Education  offered on patient's overall failure to thrive.  It sounds by her report she needs more in-home care and assistance.    Education offered today regarding  the importance of continued conversation with her  family and the medical providers regarding overall plan of care and treatment options,  ensuring decisions are within the context of the patients values and GOCs.  Encourage completion of H POA and advance care planning documents with the support of spiritual care, declined at this time.  Patient is able to tell me that her brother Roger Shelter would be who she would want to speak for her in the event that she cannot speak for herself.   A  discussion was had today regarding advanced directives.   Concepts specific to code status, artifical feeding and hydration, continued IV antibiotics and rehospitalization was had.  The difference between a aggressive medical intervention path  and a palliative comfort care path for this patient at this time was had.    MOST form introduced.     Questions and concerns addressed.  Patient  encouraged to call with questions or concerns.     PMT will continue to support holistically.            NEXT OF KIN    SUMMARY OF RECOMMENDATIONS    Code Status/Advance Care Planning: DNR-documented today.   Patient's brother supports her decision   Palliative Prophylaxis:  Delirium Protocol and Frequent Pain Assessment  Additional Recommendations (Limitations, Scope, Preferences): Avoid Hospitalization  Psycho-social/Spiritual:  Desire for further Chaplaincy support:no   Prognosis:  Unable to determine  Discharge Planning: To Be Determined      Primary Diagnoses: Present on Admission:  Hypokalemia  Dyslipidemia  Chronic diastolic heart failure (HCC)  Hypokalemia due to excessive gastrointestinal loss of potassium   I have reviewed the medical record, interviewed the patient and family, and examined the patient. The following aspects are pertinent.  Past Medical History:  Diagnosis Date   Blindness of right eye    CHF (congestive heart failure) (HCC)    Depression    Diabetes mellitus without complication (HCC)    DVT of leg (deep venous thrombosis) (HCC) 2011   Glaucoma    Grade II diastolic dysfunction    Hyperlipidemia    Hypertension    Vascular disease    Social History   Socioeconomic History   Marital status: Widowed    Spouse name: Not on file   Number of children: 0   Years of education: Not on file   Highest education level: Some college, no degree  Occupational History   Occupation: Retired  Tobacco Use   Smoking status: Every Day    Current packs/day: 0.25    Average packs/day: 0.2 packs/day for  40.2 years (10.0 ttl pk-yrs)    Types: Cigarettes    Start date: 03/16/2000   Smokeless tobacco: Never  Vaping Use   Vaping status: Never Used  Substance and Sexual Activity   Alcohol use: No    Alcohol/week: 0.0 standard drinks of alcohol   Drug use: No   Sexual activity: Not Currently  Other Topics Concern   Not on file  Social History Narrative   Not on file   Social Determinants of Health   Financial Resource Strain: Low Risk  (09/19/2022)   Overall Financial Resource Strain (CARDIA)    Difficulty of Paying Living Expenses: Not hard at all  Food Insecurity: No Food Insecurity (03/19/2022)   Hunger Vital Sign    Worried About Running  Out of Food in the Last Year: Never true    Ran Out of Food in the Last Year: Never true  Transportation Needs: No Transportation Needs (09/19/2022)   PRAPARE - Administrator, Civil Service (Medical): No    Lack of Transportation (Non-Medical): No  Physical Activity: Insufficiently Active (03/26/2021)   Exercise Vital Sign    Days of Exercise per Week: 3 days    Minutes of Exercise per Session: 20 min  Stress: No Stress Concern Present (03/26/2021)   [employer] of Occupational Health - Occupational Stress Questionnaire    Feeling of Stress : Only a little  Social Connections: Socially Isolated (03/19/2022)   Social Connection and Isolation Panel [NHANES]    Frequency of Communication with Friends and Family: More than three times a week    Frequency of Social Gatherings with Friends and Family: Once a week    Attends Religious Services: Never    Database administrator or Organizations: No    Attends Banker Meetings: Never    Marital Status: Widowed   Family History  Problem Relation Age of Onset   Heart disease Father    Diabetes Mother    Diabetes Brother    Kidney disease Brother        Transplant   Heart disease Brother    Diabetes Sister    Diabetes Sister    Scheduled Meds:  aspirin EC  81 mg  Oral Daily   atropine  1 drop Right Eye TID   brimonidine  1 drop Both Eyes BID   citalopram  10 mg Oral Daily   clopidogrel  75 mg Oral Daily   dapagliflozin propanediol  10 mg Oral Daily   dorzolamide-timolol  1 drop Both Eyes BID   linagliptin  5 mg Oral Daily   losartan  50 mg Oral Daily   metoprolol succinate  25 mg Oral Daily   mirabegron ER  25 mg Oral Daily   multivitamin with minerals  1 tablet Oral Daily   potassium chloride SA  40 mEq Oral BID   prednisoLONE acetate  1 drop Right Eye UD   [START ON 04/09/2023] psyllium  1 packet Oral Daily   rosuvastatin  10 mg Oral Daily   vitamin B-12  1,000 mcg Oral Daily   [START ON 04/09/2023] Vitamin D (Ergocalciferol)  50,000 Units Oral Q7 days   Continuous Infusions:  0.9 % NaCl with KCl 20 mEq / L 100 mL/hr at 04/08/23 0639   PRN Meds:.acetaminophen **OR** acetaminophen, metoCLOPramide (REGLAN) injection, nitroGLYCERIN, traZODone Medications Prior to Admission:  Prior to Admission medications   Medication Sig Start Date End Date Taking? Authorizing Provider  aspirin 81 MG tablet Take 81 mg by mouth daily.   Yes [provider]  atropine 1 % ophthalmic solution Place 1 drop into the left eye 3 (three) times daily.   Yes [provider]  brimonidine (ALPHAGAN) 0.2 % ophthalmic solution Place 1 drop into both eyes 2 (two) times daily.   Yes [provider]  citalopram (CELEXA) 10 MG tablet Take 1 tablet (10 mg total) by mouth daily. 01/30/23  Yes Sowles, Danna Hefty, MD  clopidogrel (PLAVIX) 75 MG tablet Take 1 tablet (75 mg total) by mouth daily. 01/30/23  Yes Sowles, Danna Hefty, MD  Cyanocobalamin (B-12) 1000 MCG SUBL Place 1 tablet under the tongue daily. 09/10/22  Yes Sowles, Danna Hefty, MD  dapagliflozin propanediol (FARXIGA) 10 MG TABS tablet TAKE 1 TABLET BY MOUTH EVERY DAY  BEFORE BREAKFAST 01/30/23  Yes Sowles, Danna Hefty, MD  dorzolamide-timolol (COSOPT) 22.3-6.8 MG/ML ophthalmic solution Place 1 drop into both eyes 2  (two) times daily. 03/02/21  Yes [provider]  losartan (COZAAR) 50 MG tablet Take 1 tablet (50 mg total) by mouth daily. 01/30/23  Yes Sowles, Danna Hefty, MD  metoprolol succinate (TOPROL XL) 25 MG 24 hr tablet Take 1 tablet (25 mg total) by mouth daily. Take with or immediately following a meal. 01/30/23 01/30/24 Yes Sowles, Danna Hefty, MD  Multiple Vitamin (MULTIVITAMIN) tablet Take 1 tablet by mouth daily.   Yes [provider]  nitroGLYCERIN (NITROSTAT) 0.4 MG SL tablet Place 1 tablet (0.4 mg total) under the tongue every 5 (five) minutes as needed for chest pain. 04/13/21  Yes Enedina Finner, MD  prednisoLONE acetate (PRED FORTE) 1 % ophthalmic suspension Place 1 drop into the left eye 4 (four) times daily. 03/07/21  Yes [provider]  rosuvastatin (CRESTOR) 10 MG tablet Take 1 tablet (10 mg total) by mouth daily. 01/30/23  Yes Sowles, Danna Hefty, MD  sitaGLIPtin (JANUVIA) 100 MG tablet Take 1 tablet (100 mg total) by mouth daily. 01/30/23  Yes Sowles, Danna Hefty, MD  Vibegron (GEMTESA) 75 MG TABS Take 75 mg by mouth daily. 04/08/22  Yes Vanna Scotland, MD  Vitamin D, Ergocalciferol, (DRISDOL) 1.25 MG (50000 UNIT) CAPS capsule TAKE 1 CAPSULE (50,000 UNITS TOTAL) BY MOUTH EVERY 7 (SEVEN) DAYS 12/19/22  Yes Sowles, Danna Hefty, MD  moxifloxacin (VIGAMOX) 0.5 % ophthalmic solution Apply to eye. Patient not taking: Reported on 04/08/2023 02/17/23   [provider]  potassium chloride SA (KLOR-CON M20) 20 MEQ tablet Take 1 tablet (20 mEq total) by mouth 2 (two) times daily. But three times a day for the next few days and return for labs Patient not taking: Reported on 04/08/2023 02/26/22   Alba Cory, MD  psyllium (METAMUCIL) 0.52 g capsule Take 1 capsule (0.52 g total) by mouth daily. Patient not taking: Reported on 04/08/2023 03/19/21   Caro Laroche, DO   Allergies  Allergen Reactions   Other Itching    Nucinta - Hallucinations   Tapentadol Hcl Itching    Hallucinations    Actos [Pioglitazone]     History of CHF   Penicillins Rash   Review of Systems  Neurological:  Positive for weakness.    Physical Exam Constitutional:      Appearance: She is normal weight. She is ill-appearing.  Cardiovascular:     Rate and Rhythm: Normal rate.  Pulmonary:     Effort: Pulmonary effort is normal.  Musculoskeletal:     Comments: Generalized weakness Noted left metatarsal amputations  Skin:    General: Skin is warm and dry.  Neurological:     Mental Status: She is alert.     Vital Signs: BP (!) 162/87 (BP Location: Right Arm)   Pulse 69   Temp 98.3 F (36.8 C) (Oral)   Resp (!) 23   SpO2 99%  Pain Scale: 0-10   Pain Score: 0-No pain   SpO2: SpO2: 99 % O2 Device:SpO2: 99 % O2 Flow Rate: .   IO: Intake/output summary:  Intake/Output Summary (Last 24 hours) at 04/08/2023 1006 Last data filed at 04/08/2023 0125 Gross per 24 hour  Intake 250 ml  Output --  Net 250 ml    LBM:   Baseline Weight:   Most recent weight:       Palliative Assessment/Data:   50%     Time  75 minutes  Signed by: Lorinda Creed, NP   Please contact Palliative Medicine Team phone at [phone] for questions and concerns.  For individual provider: See Loretha Stapler

## 2023-04-08 NOTE — TOC Benefit Eligibility Note (Addendum)
Patient Product/process development scientist completed.    The patient is insured through [facility]. Patient has [employer], may use a copay card, and/or apply for patient assistance if available.    Ran test claim for Eliquis 5 mg and the current 30 day co-pay is $2.00.  Ran test claim for Xarelto 20 mg and the current 30 day co-pay is $2.00.  Ran test claim for Jardiance 10 mg and the current 30 day co-pay is $2.00.  Ran test claim for Entresto 24-26 mg and Requires Prior Authorization  This test claim was processed through [employer]- copay amounts may vary at other pharmacies due to [employer], or as the patient moves through the different stages of their insurance plan.     Lisa [name], CPHT Pharmacy Technician III Certified Patient Advocate [facility] Patient Advocate Team Direct Number: [phone]  Fax: [phone]

## 2023-04-08 NOTE — Assessment & Plan Note (Signed)
-   We will continue antihypertensive therapy. - She be placed on as needed IV hydralazine and labetalol.

## 2023-04-08 NOTE — Assessment & Plan Note (Signed)
-   We will Continue IV heparin.

## 2023-04-08 NOTE — ED Provider Notes (Signed)
1:43 AM  Assumed care at shift change.  Patient here with complaints of generalized weakness, not feeling well, nausea and chronic diarrhea x 1 year.  Found to be profoundly hypokalemic also with hypomagnesemia with prolonged QT interval.  Getting IV and oral replacement.  Patient also had left upper extremity swelling.  Found to have nonocclusive DVT in the left internal jugular vein and superficial thrombophlebitis of the left basilic vein.  She did recently have cataract surgery but is not sure where her peripheral IV was placed.  Denies any procedures to her neck recently.  Will start heparin.  She does complain of shortness of breath.  Will obtain CTA of the chest to rule out PE.  First troponin is elevated at 35.  She denies any chest pain.  Will discuss with hospitalist for admission.     Consulted and discussed patient's case with hospitalist, Dr. Arville Care.  I have recommended admission and consulting physician agrees and will place admission orders.  Patient (and family if present) agree with this plan.   I reviewed all nursing notes, vitals, pertinent previous records.  All labs, EKGs, imaging ordered have been independently reviewed and interpreted by myself.    CRITICAL CARE Performed by: Rochele Raring   Total critical care time: 30 minutes  Critical care time was exclusive of separately billable procedures and treating other patients.  Critical care was necessary to treat or prevent imminent or life-threatening deterioration.  Critical care was time spent personally by me on the following activities: development of treatment plan with patient and/or surrogate as well as nursing, discussions with consultants, evaluation of patient's response to treatment, examination of patient, obtaining history from patient or surrogate, ordering and performing treatments and interventions, ordering and review of laboratory studies, ordering and review of radiographic studies, pulse oximetry and  re-evaluation of patient's condition.    Eamonn Sermeno, Layla Maw, DO 04/08/23 [phone]

## 2023-04-08 NOTE — Assessment & Plan Note (Addendum)
-   This like secondary to diarrhea and viral repletion. - The patient be admitted to a progressive unit bed. - Potassium will be aggressively replaced and followed. - This is associated with hypomagnesemia that will be replaced as well. - Antidiarrheals will be provided.

## 2023-04-08 NOTE — Assessment & Plan Note (Addendum)
-   The patient be placed on supplemental coverage with [employer]. - We will continue her Januvia.

## 2023-04-08 NOTE — Progress Notes (Signed)
Heart Failure Nurse Navigator Progress Note  PCP: Alba Cory, MD PCP-Cardiologist: None Admission Diagnosis: Hypokalemia & Nausea Admitted from: Home  Presentation:   Lisa [name] presented with nausea & feeling weak.  Nonproductive cough. New swelling to her left upper extremity.   ECHO/ LVEF: Echo 02/26/21 60-65%  Clinical Course:  Past Medical History:  Diagnosis Date   Blindness of right eye    CHF (congestive heart failure) (HCC)    Depression    Diabetes mellitus without complication (HCC)    DVT of leg (deep venous thrombosis) (HCC) 2011   Glaucoma    Grade II diastolic dysfunction    Hyperlipidemia    Hypertension    Vascular disease      Social History   Socioeconomic History   Marital status: Widowed    Spouse name: Not on file   Number of children: 0   Years of education: Not on file   Highest education level: Some college, no degree  Occupational History   Occupation: Retired  Tobacco Use   Smoking status: Every Day    Current packs/day: 0.25    Average packs/day: 0.2 packs/day for 40.2 years (10.0 ttl pk-yrs)    Types: Cigarettes    Start date: 03/16/2000   Smokeless tobacco: Never  Vaping Use   Vaping status: Never Used  Substance and Sexual Activity   Alcohol use: Yes    Comment: Wine Ocassionally   Drug use: No   Sexual activity: Not Currently  Other Topics Concern   Not on file  Social History Narrative   Not on file   Social Determinants of Health   Financial Resource Strain: Medium Risk (04/08/2023)   Overall Financial Resource Strain (CARDIA)    Difficulty of Paying Living Expenses: Somewhat hard  Food Insecurity: No Food Insecurity (04/08/2023)   Hunger Vital Sign    Worried About Running Out of Food in the Last Year: Never true    Ran Out of Food in the Last Year: Never true  Transportation Needs: No Transportation Needs (04/08/2023)   PRAPARE - Administrator, Civil Service (Medical): No    Lack of  Transportation (Non-Medical): No  Physical Activity: Insufficiently Active (03/26/2021)   Exercise Vital Sign    Days of Exercise per Week: 3 days    Minutes of Exercise per Session: 20 min  Stress: No Stress Concern Present (03/26/2021)   [employer] of Occupational Health - Occupational Stress Questionnaire    Feeling of Stress : Only a little  Social Connections: Socially Isolated (03/19/2022)   Social Connection and Isolation Panel [NHANES]    Frequency of Communication with Friends and Family: More than three times a week    Frequency of Social Gatherings with Friends and Family: Once a week    Attends Religious Services: Never    Database administrator or Organizations: No    Attends Banker Meetings: Never    Marital Status: Widowed  Education Assessment and Provision:  Detailed education and instructions provided on heart failure disease management including the following:  Signs and symptoms of Heart Failure When to call the physician Importance of daily weights Low sodium diet Fluid restriction Medication management Anticipated future follow-up appointments  Patient education given on each of the above topics.  Patient acknowledges understanding via teach back method and acceptance of all instructions.  Education Materials:  "Living Better With Heart Failure" Booklet, HF zone tool, & Daily Weight Tracker Tool.  Patient has scale at home:  Yes Patient has pill box at home: Yes    High Risk Criteria for Readmission and/or Poor Patient Outcomes: Heart failure hospital admissions (last 6 months): 0  No Show rate: 13 Difficult social situation: None Demonstrates medication adherence: Yes Primary Language: English Literacy level: Reading, Writing & Comprehension  Barriers of Care:   Diet & Fluid Restrictions Daily Weights Medication Compliance New transportation concerns  Considerations/Referrals:   Referral made to Heart Failure Pharmacist  Stewardship: Yes Referral made to CSW/NCM TOC:Yes-for transportation & heating bill concerns. Referral made to Heart & Vascular TOC clinic: Yes  Items for Follow-up on DC/TOC: Diet & Fluid Restrictions Daily Weights Medication Compliance New transportation concerns-just recently stopped driving because of vision loss Continued Heart Failure Education  Roxy Horseman, RN, BSN [facility] Heart Failure Navigator Secure Chat Only

## 2023-04-08 NOTE — Progress Notes (Signed)
ANTICOAGULATION CONSULT NOTE  Pharmacy Consult for heparin infusion Indication: VTE Tx  Allergies  Allergen Reactions   Other Itching    Nucinta - Hallucinations   Tapentadol Hcl Itching    Hallucinations   Actos [Pioglitazone]     History of CHF   Penicillins Rash    Patient Measurements:   Heparin Dosing Weight: 55 kg (from O/V note 01/27/23)  Vital Signs: Temp: 98.2 F (36.8 C) (11/13 0028) Temp Source: Oral (11/12 2141) BP: 171/78 (11/13 0031) Pulse Rate: 69 (11/13 0028)  Labs: Recent Labs    04/07/23 2154  HGB 12.5  HCT 38.3  PLT 377  CREATININE 0.65  TROPONINIHS 35*    CrCl cannot be calculated (Unknown ideal weight.).   Medical History: Past Medical History:  Diagnosis Date   Blindness of right eye    CHF (congestive heart failure) (HCC)    Depression    Diabetes mellitus without complication (HCC)    DVT of leg (deep venous thrombosis) (HCC) 2011   Glaucoma    Grade II diastolic dysfunction    Hyperlipidemia    Hypertension    Vascular disease     Assessment: Pt is a 77 yo female presenting to ED c/o nausea found with "nonocclusive DVT within the LEFT internal jugular vein & occlusive superficial thrombophlebitis involving the LEFT basilic vein."  Goal of Therapy:  Heparin level 0.3-0.7 units/ml Monitor platelets by anticoagulation protocol: Yes   Plan:  Bolus 3300 units x 1 Start heparin infusion at 850 units/hr Will check HL in 8 hr after start of infusion CBC daily while on heparin  Otelia Sergeant, PharmD, [facility] 04/08/2023 1:18 AM

## 2023-04-08 NOTE — H&P (Signed)
[facility]   PATIENT NAME: Lisa [name]    MR#:  [id number]  DATE OF BIRTH:  11/19/1945  DATE OF ADMISSION:  04/07/2023  PRIMARY CARE PHYSICIAN: Alba Cory, MD   Patient is coming from: Home  REQUESTING/REFERRING PHYSICIAN: Ward, Layla Maw, DO  CHIEF COMPLAINT:   Chief Complaint  Patient presents with   Nausea    HISTORY OF PRESENT ILLNESS:  Lisa [name] is a 77 y.o. African-American female with medical history significant for CHF, type 2 diabetes mellitus, lower extremity DVT, depression, hypertension, dyslipidemia, grade 2 diastolic dysfunction and right eye blindness, who presented to the emergency room with acute onset of nausea and generalized weakness over the last 2 to 3 days.  She has been fairly sleepy all the time.  No chest pain or palpitations.  She has been having nonproductive cough with occasional wheezing without dyspnea.  She has been having urinary frequency and frequent watery bowel movements.  No paresthesias or focal muscle weakness.  She has been having swelling of the left upper extremity that is new for her with associated pain for the last 3 days.  No dysuria, oliguria or hematuria or flank pain.  No bleeding diathesis.  ED Course: When she came to the ER, BP was elevated 250/104 with otherwise normal vital signs.  Labs revealed severe hypokalemia of 2.1 and hypomagnesemia 1.4.  Blood glucose was 175 with albumin of 2.7 otherwise unremarkable CMP.  BNP was 840.1 and high sensitive troponin was 35.  CBC showed mild leukocytosis of 11.7 with neutrophilia. EKG as reviewed by me : EKG showed normal sinus rhythm with rate of 77 with possible left atrial enlargement and anterior Q waves as well as prolonged QT interval with QTc of 503 MS. Imaging: Portable chest x-ray showed stable cardiomegaly and mild left basilar atelectasis and/or infiltrate.  Chest CTA is currently pending. Left upper extremity venous Doppler revealed: 1. Nonocclusive DVT within  the LEFT internal jugular vein. 2. Occlusive superficial thrombophlebitis involving the LEFT basilic vein.  The patient was given IV heparin, 10 mg of IV hydralazine, 50 mg p.o. Cozaar, 2 g IV magnesium sulfate, 25 mg p.o. Lopressor and 40 mill equivalent p.o. potassium chloride.  She will be admitted to a progressive unit bed for further evaluation and management. PAST MEDICAL HISTORY:   Past Medical History:  Diagnosis Date   Blindness of right eye    CHF (congestive heart failure) (HCC)    Depression    Diabetes mellitus without complication (HCC)    DVT of leg (deep venous thrombosis) (HCC) 2011   Glaucoma    Grade II diastolic dysfunction    Hyperlipidemia    Hypertension    Vascular disease     PAST SURGICAL HISTORY:   Past Surgical History:  Procedure Laterality Date   ABDOMINAL HYSTERECTOMY  1992   Total   CATARACT EXTRACTION W/PHACO Left 02/25/2023   Procedure: CATARACT EXTRACTION PHACO AND INTRAOCULAR LENS PLACEMENT (IOC) LEFT DIABETIC  malyugin;  Surgeon: Lockie Mola, MD;  Location: [facility];  Service: Ophthalmology;  Laterality: Left;  10.78 0:49.1   EYE SURGERY     TOE AMPUTATION Left 2011   All of her toes on the left foot   VASCULAR SURGERY      SOCIAL HISTORY:   Social History   Tobacco Use   Smoking status: Every Day    Current packs/day: 0.25    Average packs/day: 0.2 packs/day for 40.2 years (10.0 ttl pk-yrs)  Types: Cigarettes    Start date: 03/16/2000   Smokeless tobacco: Never  Substance Use Topics   Alcohol use: No    Alcohol/week: 0.0 standard drinks of alcohol    FAMILY HISTORY:   Family History  Problem Relation Age of Onset   Heart disease Father    Diabetes Mother    Diabetes Brother    Kidney disease Brother        Transplant   Heart disease Brother    Diabetes Sister    Diabetes Sister     DRUG ALLERGIES:   Allergies  Allergen Reactions   Other Itching    Nucinta - Hallucinations   Tapentadol  Hcl Itching    Hallucinations   Actos [Pioglitazone]     History of CHF   Penicillins Rash    REVIEW OF SYSTEMS:   ROS As per history of present illness. All pertinent systems were reviewed above. Constitutional, HEENT, cardiovascular, respiratory, GI, GU, musculoskeletal, neuro, psychiatric, endocrine, integumentary and hematologic systems were reviewed and are otherwise negative/unremarkable except for positive findings mentioned above in the HPI.   MEDICATIONS AT HOME:   Prior to Admission medications   Medication Sig Start Date End Date Taking? Authorizing Provider  aspirin 81 MG tablet Take 81 mg by mouth daily.    [provider]  atropine 1 % ophthalmic solution Place 1 drop into the right eye 3 (three) times daily.    [provider]  brimonidine (ALPHAGAN) 0.2 % ophthalmic solution Place 1 drop into both eyes 2 (two) times daily.    [provider]  citalopram (CELEXA) 10 MG tablet Take 1 tablet (10 mg total) by mouth daily. 01/30/23   Alba Cory, MD  clopidogrel (PLAVIX) 75 MG tablet Take 1 tablet (75 mg total) by mouth daily. 01/30/23   Alba Cory, MD  Cyanocobalamin (B-12) 1000 MCG SUBL Place 1 tablet under the tongue daily. 09/10/22   Alba Cory, MD  dapagliflozin propanediol (FARXIGA) 10 MG TABS tablet TAKE 1 TABLET BY MOUTH EVERY DAY BEFORE BREAKFAST 01/30/23   Alba Cory, MD  dorzolamide-timolol (COSOPT) 22.3-6.8 MG/ML ophthalmic solution SMARTSIG:In Eye(s) 03/02/21   [provider]  losartan (COZAAR) 50 MG tablet Take 1 tablet (50 mg total) by mouth daily. 01/30/23   Alba Cory, MD  metoprolol succinate (TOPROL XL) 25 MG 24 hr tablet Take 1 tablet (25 mg total) by mouth daily. Take with or immediately following a meal. 01/30/23 01/30/24  Alba Cory, MD  moxifloxacin (VIGAMOX) 0.5 % ophthalmic solution Apply to eye. 02/17/23   [provider]  Multiple Vitamin (MULTIVITAMIN) tablet Take 1 tablet by mouth daily.     [provider]  nitroGLYCERIN (NITROSTAT) 0.4 MG SL tablet Place 1 tablet (0.4 mg total) under the tongue every 5 (five) minutes as needed for chest pain. 04/13/21   Enedina Finner, MD  potassium chloride SA (KLOR-CON M20) 20 MEQ tablet Take 1 tablet (20 mEq total) by mouth 2 (two) times daily. But three times a day for the next few days and return for labs 02/26/22   Alba Cory, MD  prednisoLONE acetate (PRED FORTE) 1 % ophthalmic suspension Place into the right eye. 03/07/21   [provider]  psyllium (METAMUCIL) 0.52 g capsule Take 1 capsule (0.52 g total) by mouth daily. 03/19/21   Caro Laroche, DO  rosuvastatin (CRESTOR) 10 MG tablet Take 1 tablet (10 mg total) by mouth daily. 01/30/23   Alba Cory, MD  sitaGLIPtin (JANUVIA) 100 MG tablet Take  1 tablet (100 mg total) by mouth daily. 01/30/23   Sowles, Danna Hefty, MD  Vibegron (GEMTESA) 75 MG TABS Take 75 mg by mouth daily. 04/08/22   Vanna Scotland, MD  Vitamin D, Ergocalciferol, (DRISDOL) 1.25 MG (50000 UNIT) CAPS capsule TAKE 1 CAPSULE (50,000 UNITS TOTAL) BY MOUTH EVERY 7 (SEVEN) DAYS 12/19/22   Alba Cory, MD      VITAL SIGNS:  Blood pressure (!) 171/78, pulse 70, temperature 98.2 F (36.8 C), resp. rate (!) 21, SpO2 94%.  PHYSICAL EXAMINATION:  Physical Exam  GENERAL:  77 y.o.-year-old African-American female patient lying in the bed with no acute distress.  EYES: Pupils equal, round, reactive to light and accommodation. No scleral icterus. Extraocular muscles intact.  HEENT: Head atraumatic, normocephalic. Oropharynx and nasopharynx clear.  NECK:  Supple, no jugular venous distention. No thyroid enlargement, no tenderness.  LUNGS: Normal breath sounds bilaterally, no wheezing, rales,rhonchi or crepitation. No use of accessory muscles of respiration.  CARDIOVASCULAR: Regular rate and rhythm, S1, S2 normal. No murmurs, rubs, or gallops.  ABDOMEN: Soft, nondistended, nontender. Bowel sounds present. No  organomegaly or mass.  EXTREMITIES: No pedal edema, cyanosis, or clubbing.  Left upper extremity swelling involving the left hand and forearm. NEUROLOGIC: Cranial nerves II through XII are intact. Muscle strength 5/5 in all extremities. Sensation intact. Gait not checked.  PSYCHIATRIC: The patient is alert and oriented x 3.  Normal affect and good eye contact. SKIN: No obvious rash, lesion, or ulcer.   LABORATORY PANEL:   CBC Recent Labs  Lab 04/07/23 2154  WBC 11.7*  HGB 12.5  HCT 38.3  PLT 377   ------------------------------------------------------------------------------------------------------------------  Chemistries  Recent Labs  Lab 04/07/23 2154 04/07/23 2201  NA 138  --   K 2.1*  --   CL 93*  --   CO2 32  --   GLUCOSE 175*  --   BUN 8  --   CREATININE 0.65  --   CALCIUM 7.6*  --   MG  --  1.4*  AST 23  --   ALT 17  --   ALKPHOS 97  --   BILITOT 1.0  --    ------------------------------------------------------------------------------------------------------------------  Cardiac Enzymes No results for input(s): "TROPONINI" in the last 168 hours. ------------------------------------------------------------------------------------------------------------------  RADIOLOGY:  US Venous Img Upper Uni Left  Result Date: 04/08/2023 CLINICAL DATA:  Left upper extremity swelling. EXAM: LEFT UPPER EXTREMITY VENOUS DOPPLER ULTRASOUND TECHNIQUE: Gray-scale sonography with graded compression, as well as color Doppler and duplex ultrasound were performed to evaluate the upper extremity deep venous system from the level of the subclavian vein and including the jugular, axillary, basilic, radial, ulnar and upper cephalic vein. Spectral Doppler was utilized to evaluate flow at rest and with distal augmentation maneuvers. COMPARISON:  None Available. FINDINGS: Contralateral Subclavian Vein: Respiratory phasicity is normal and symmetric with the symptomatic side. No evidence of  thrombus. Normal compressibility. Internal Jugular Vein: Evidence of nonocclusive thrombus with abnormal compressibility, respiratory phasicity and response to augmentation. Subclavian Vein: No evidence of thrombus. Normal compressibility, respiratory phasicity and response to augmentation. Axillary Vein: No evidence of thrombus. Normal compressibility, respiratory phasicity and response to augmentation. Cephalic Vein: No evidence of thrombus. Normal compressibility, respiratory phasicity and response to augmentation. Basilic Vein: No evidence of thrombus. Normal compressibility, respiratory phasicity and response to augmentation. Brachial Veins: Evidence of occlusive thrombus with abnormal compressibility, respiratory phasicity and response to augmentation. Radial Veins: No evidence of thrombus. Normal compressibility, respiratory phasicity and response to augmentation. Ulnar Veins: No  evidence of thrombus. Normal compressibility, respiratory phasicity and response to augmentation. Venous Reflux:  None visualized. Other Findings:  None visualized. IMPRESSION: 1. Nonocclusive DVT within the LEFT internal jugular vein. 2. Occlusive superficial thrombophlebitis involving the LEFT basilic vein. Electronically Signed   By: Aram Candela M.D.   On: 04/08/2023 00:40   DG Chest Portable 1 View  Result Date: 04/08/2023 CLINICAL DATA:  Chest pain and shortness of breath. EXAM: PORTABLE CHEST 1 VIEW COMPARISON:  April 09, 2021 FINDINGS: The cardiac silhouette is mildly enlarged and unchanged in size. There is marked severity calcification of the thoracic aorta. Mild, diffuse, chronic appearing increased lung markings are seen with mild atelectasis and/or infiltrate noted within the retrocardiac region of the left lung base. No pleural effusion or pneumothorax is identified. Multilevel degenerative changes seen throughout the thoracic spine. IMPRESSION: Stable cardiomegaly with mild left basilar atelectasis and/or  infiltrate. Electronically Signed   By: Aram Candela M.D.   On: 04/08/2023 00:36   CT Head Wo Contrast  Result Date: 04/08/2023 CLINICAL DATA:  Intermittent confusion, markedly elevated blood pressure. Query hemorrhagic CVA. Nausea and vomiting. EXAM: CT HEAD WITHOUT CONTRAST TECHNIQUE: Contiguous axial images were obtained from the base of the skull through the vertex without intravenous contrast. RADIATION DOSE REDUCTION: This exam was performed according to the departmental dose-optimization program which includes automated exposure control, adjustment of the mA and/or kV according to patient size and/or use of iterative reconstruction technique. COMPARISON:  CT and MRI 02/26/2021 FINDINGS: Brain: No intracranial hemorrhage, mass effect, or evidence of acute infarct. No hydrocephalus. No extra-axial fluid collection. Age-commensurate cerebral atrophy and chronic small vessel ischemic disease. Physiologic basal ganglia calcifications. Vascular: No hyperdense vessel. Intracranial arterial calcification. Skull: No fracture or focal lesion. Sinuses/Orbits: No acute finding.  Right globe prosthesis. Other: None. IMPRESSION: No acute intracranial abnormality. Electronically Signed   By: Minerva Fester M.D.   On: 04/08/2023 00:14      IMPRESSION AND PLAN:  Assessment and Plan: Hypokalemia - This like secondary to diarrhea and viral repletion. - The patient be admitted to a progressive unit bed. - Potassium will be aggressively replaced and followed. - This is associated with hypomagnesemia that will be replaced as well. - Antidiarrheals will be provided.  Acute deep vein thrombosis (DVT) of left upper extremity (HCC) - We will Continue IV heparin.  Hypertensive urgency - We will continue antihypertensive therapy. - She be placed on as needed IV hydralazine and labetalol.  Dyslipidemia - We will continue statin therapy.  Type 2 diabetes mellitus without complications (HCC) - The patient  be placed on supplemental coverage with NovoLog. - We will continue her Januvia.  Chronic diastolic heart failure (HCC) - We will continue Januvia and beta-blocker therapy.    DVT prophylaxis: Lovenox. Advanced Care Planning:  Code Status: full code. Family Communication:  The plan of care was discussed in details with the patient (and family). I answered all questions. The patient agreed to proceed with the above mentioned plan. Further management will depend upon hospital course. Disposition Plan: Back to previous home environment Consults called: none. All the records are reviewed and case discussed with ED provider.  Status is: Inpatient   At the time of the admission, it appears that the appropriate admission status for this patient is inpatient.  This is judged to be reasonable and necessary in order to provide the required intensity of service to ensure the patient's safety given the presenting symptoms, physical exam findings and initial radiographic and  laboratory data in the context of comorbid conditions.  The patient requires inpatient status due to high intensity of service, high risk of further deterioration and high frequency of surveillance required.  I certify that at the time of admission, it is my clinical judgment that the patient will require inpatient hospital care extending more than 2 midnights.                            Dispo: The patient is from: Home              Anticipated d/c is to: Home              Patient currently is not medically stable to d/c.              Difficult to place patient: No  Hannah Beat M.D on 04/08/2023 at 2:48 AM  [facility]   From 7 PM-7 AM, contact night-coverage [email or web address]  CC: Primary care physician; Alba Cory, MD

## 2023-04-08 NOTE — Progress Notes (Addendum)
Heart Failure Stewardship Pharmacy Note  PCP: Alba Cory, MD PCP-Cardiologist: None  HPI: Lisa [name] is a 77 y.o. female with DM, hyperlipidemia, HTN, depression, LE DVT, vascular disease, tobacco use, and chronic heart failure with preserved EF, and right eye blindness who presented with nausea and generalized weakness over the past few days. Markedly hypertensice with SBP in the 200s on admission. BNP on admission was 840.1 and HS-troponin was 35. CTA was negative for PE but showed volume overload with pulmonary edema. Doppler notable for nonocclusive DVT of the left internal jugular and occlusive superficial thrombophlebitis of the left basilic vein.   Pertinent cardiac history: Echo in 02/2021 showed LVEF of 60-65% with grade II diastolic dysfunction  Pertinent Lab Values: Creat  Date Value Ref Range Status  02/03/2023 0.77 0.60 - 1.00 mg/dL Final   Creatinine, Ser  Date Value Ref Range Status  04/08/2023 0.45 0.44 - 1.00 mg/dL Final   BUN  Date Value Ref Range Status  04/08/2023 7 (L) 8 - 23 mg/dL Final  16/02/9603 8 8 - 27 mg/dL Final   Potassium  Date Value Ref Range Status  04/08/2023 2.2 (LL) 3.5 - 5.1 mmol/L Final    Comment:    CRITICAL RESULT CALLED TO, READ BACK BY AND VERIFIED WITH ASHLEY DAVIS 04/08/23 @ 0815 BY SH    Sodium  Date Value Ref Range Status  04/08/2023 136 135 - 145 mmol/L Final  09/25/2015 142 134 - 144 mmol/L Final   B Natriuretic Peptide  Date Value Ref Range Status  04/07/2023 840.1 (H) 0.0 - 100.0 pg/mL Final    Comment:    Performed at [facility], [address], [place], [place] [address]   Magnesium  Date Value Ref Range Status  04/07/2023 1.4 (L) 1.7 - 2.4 mg/dL Final    Comment:    Performed at [facility], [address] [place], [place] [address]   Hemoglobin A1C  Date Value Ref Range Status  01/30/2023 6.4 (A) 4.0 - 5.6 % Final   HbA1c, POC (prediabetic range)  Date Value Ref Range Status   08/16/2019 6.5 (A) 5.7 - 6.4 % Final   Hgb A1c MFr Bld  Date Value Ref Range Status  02/17/2020 8.5 (H) <5.7 % of total Hgb Final    Comment:    For someone without known diabetes, a hemoglobin A1c value of 6.5% or greater indicates that they may have  diabetes and this should be confirmed with a follow-up  test. . For someone with known diabetes, a value <7% indicates  that their diabetes is well controlled and a value  greater than or equal to 7% indicates suboptimal  control. A1c targets should be individualized based on  duration of diabetes, age, comorbid conditions, and  other considerations. . Currently, no consensus exists regarding use of hemoglobin A1c for diagnosis of diabetes for children. .    TSH  Date Value Ref Range Status  02/19/2022 3.72 0.40 - 4.50 mIU/L Final   Vital Signs: Admission weight: none Temp:  [98.2 F (36.8 C)-98.4 F (36.9 C)] 98.3 F (36.8 C) (11/13 0806) Pulse Rate:  [63-91] 69 (11/13 0806) Resp:  [18-26] 23 (11/13 0806) BP: (161-250)/(71-178) 162/87 (11/13 0806) SpO2:  [94 %-100 %] 99 % (11/13 0806)  Intake/Output Summary (Last 24 hours) at 04/08/2023 0817 Last data filed at 04/08/2023 0125 Gross per 24 hour  Intake 250 ml  Output --  Net 250 ml   Current Heart Failure Medications:  Loop diuretic:  none Beta-Blocker: metoprolol succinate 25 mg daily ACEI/ARB/ARNI: losartan 50 mg daily MRA: none SGLT2i: Farxiga 10 mg daily  Prior to admission Heart Failure Medications:  Loop diuretic: none Beta-Blocker: metoprolol succinate 25 mg daily ACEI/ARB/ARNI: losartan 50 mg daily MRA: none SGLT2i: Farxiga 10 mg daily  Assessment: 1. Acute on chronic diastolic heart failure (LVEF 60-65%) with grade II diastolic dysfunction in 2022, due to presumed NICM. NYHA class II-III symptoms.  -Symptoms: Denies shortness of breath. Reports swelling only in her left arm. Denies orthopnea. Chief complaints at this time are eye pain and arm pain,  not HFpEF symptoms. -Volume: Appears euvolemic except for unilateral arm swelling -Hemodynamics: BP was extremely high on admission and is now 160-180s/70s. HR stable in 60s.  -SGLT2i: Continue Farxiga 10 mg daily -MRA: Consider adding spironolactone given severe hypokalemia, HFpEF, and resistant hypertension. -ARNI: Would benefit from transition to [facility] given marked hypertension if it is affordible. Will check copays. -Not currently receiving anticoagulation due to non occlusive DVT. Symptoms suspected secondary to thrombophlebitis. -Also on amlodipine 10 mg daily at home, can consider adding back if BP remains uncontrolled.  Plan: 1) Medication changes recommended at this time: -Consider starting spironolactone 25 mg daily  2) Patient assistance: -Pending  3) Education: - Patient has been educated on current HF medications and potential additions to HF medication regimen - Patient verbalizes understanding that over the next few months, these medication doses may change and more medications may be added to optimize HF regimen - Patient has been educated on basic disease state pathophysiology and goals of therapy  Medication Assistance / Insurance Benefits Check: Does the patient have prescription insurance?    Type of insurance plan:  Does the patient qualify for medication assistance through manufacturers or grants? Pending   Outpatient Pharmacy: Prior to admission outpatient pharmacy: [facility]     Please do not hesitate to reach out with questions or concerns,  Enos Fling, PharmD, CPP, BCPS Heart Failure Pharmacist  Phone - [phone] 04/08/2023 12:28 PM

## 2023-04-08 NOTE — ED Notes (Signed)
Pt in ct scan  

## 2023-04-08 NOTE — ED Notes (Signed)
Pt requested something to eat, per Dr Elesa Massed, pt okay to eat. Pt given a meal tray.

## 2023-04-08 NOTE — Assessment & Plan Note (Signed)
-   We will continue Januvia and beta-blocker therapy.

## 2023-04-08 NOTE — Progress Notes (Signed)
Initial Nutrition Assessment  DOCUMENTATION CODES:   Non-severe (moderate) malnutrition in context of social or environmental circumstances  INTERVENTION:   -Liberalize diet to regular for widest variety of meal selections -MVI with minerals daily -Ensure Enlive po BID, each supplement provides 350 kcal and 20 grams of protein.  -Obtain new wt -RD will obtain labs and replete as appropriate due to potential micronutrient deficiencies in the setting of diarrhea: zinc, niacin, folate, vitamin A  NUTRITION DIAGNOSIS:   Moderate Malnutrition related to social / environmental circumstances as evidenced by mild fat depletion, moderate fat depletion, mild muscle depletion, moderate muscle depletion.  GOAL:   Patient will meet greater than or equal to 90% of their needs  MONITOR:   PO intake, Supplement acceptance  REASON FOR ASSESSMENT:   Consult Assessment of nutrition requirement/status  ASSESSMENT:   Pt with medical history significant for CHF, type 2 diabetes mellitus, lower extremity DVT, depression, hypertension, dyslipidemia, grade 2 diastolic dysfunction and right eye blindness, who presented with acute onset of nausea and generalized weakness over the last 2 to 3 days PTA.  Pt admitted with hypokalemia secondary to diarrhea and viral repletion.   Reviewed I/O's: +250 ml x 24 hours  Spoke with pt at bedside, who just arrived to the units from the ED. She was consuming a chik fil a sandwich brought in by a friend. Noted meal tray in front of her; pt shares she does not like what was served to her and would prefer to eat her sandwich.    Pt reports poor appetite for the past 5 days PTA. She shares her last "real meal was on Friday, 04/03/23. Pt shares that she has had difficulty keeping foods and liquids down and mostly eating graham crackers.   Pt shares that she has experienced a general decline in health over the past year, which she believed started with a fall at home,  which caused her to lay on a concrete floor for an unknown period of time. She has developed decreased oral intake and weakness since then. Per pt, she has also had diarrhea "for years" and "food runs right through me". She has not noticed any patterns with her diarrhea and specific foods do not exacerbate it ("It happens with everything"). Per pt, she is weak, but tries to be careful at home to reduce risk of potential falls.   Pt generally consumed 2 meals per day (Breakfast: cereal, grits, oatmeal, waffles, eggs; Dinner: shrimp and oyster s from [employer]).   Pt reports she has "lost a lot of weight" and feels ashamed about this. Emotional support provided. Pt reports her UBW is around 140#, which she last weighed around 1 years. She estimates she is now 125-130#. No weight since 02/2023; RD will obtain new wt to better assess weight changes.   Discussed importance of good meal and supplement intake to promote healing. Pt amenable to supplements.   Palliative care consult pending for goals of care discussions. Pt has history of chronic diarrhea and functional decline; she has poor support and lives with her brother (who is an alcoholic).   Medications reviewed anc include metamucil, vitamin B-12, vitamin D< and 0.9% NaCl with KCl 20 MEq/L infusion @ 100 ml/hr.   Lab Results  Component Value Date   HGBA1C 6.4 (A) 01/30/2023   PTA DM medications are 100 mg [place] daily.   Labs reviewed: K: 2.2, CBGS: 142 (inpatient orders for glycemic control are 5 mg tradjenta daily).    NUTRITION -  FOCUSED PHYSICAL EXAM:  Flowsheet Row Most Recent Value  Orbital Region No depletion  Upper Arm Region Moderate depletion  Thoracic and Lumbar Region No depletion  Buccal Region Mild depletion  Temple Region Moderate depletion  Clavicle Bone Region Mild depletion  Clavicle and Acromion Bone Region Mild depletion  Scapular Bone Region Mild depletion  Dorsal Hand Moderate depletion  Patellar  Region Moderate depletion  Anterior Thigh Region Moderate depletion  Posterior Calf Region Moderate depletion  Edema (RD Assessment) Mild  Hair Reviewed  Eyes Reviewed  Mouth Reviewed  Skin Reviewed  Nails Reviewed       Diet Order:   Diet Order             Diet regular Room service appropriate? Yes; Fluid consistency: Thin  Diet effective now                   EDUCATION NEEDS:   Education needs have been addressed  Skin:  Skin Assessment: Reviewed RN Assessment  Last BM:  Unknown  Height:   Ht Readings from Last 1 Encounters:  04/08/23 5\' 4"  (1.626 m)    Weight:   Wt Readings from Last 1 Encounters:  02/25/23 53.5 kg    Ideal Body Weight:  54.5 kg  BMI:  Body mass index is 20.25 kg/m.  Estimated Nutritional Needs:   Kcal:  1650-1850  Protein:  85-100 grams  Fluid:  > 1.6 L    Levada Schilling, RD, LDN, CDCES Registered Dietitian III Certified Diabetes Care and Education Specialist Please refer to [facility] for RD and/or RD on-call/weekend/after hours pager

## 2023-04-08 NOTE — Assessment & Plan Note (Signed)
-   We will continue statin therapy. 

## 2023-04-09 ENCOUNTER — Ambulatory Visit: Payer: Medicare Other | Admitting: Urology

## 2023-04-09 DIAGNOSIS — N8111 Cystocele, midline: Secondary | ICD-10-CM

## 2023-04-09 DIAGNOSIS — R32 Unspecified urinary incontinence: Secondary | ICD-10-CM

## 2023-04-09 DIAGNOSIS — R627 Adult failure to thrive: Secondary | ICD-10-CM

## 2023-04-09 DIAGNOSIS — Z7189 Other specified counseling: Secondary | ICD-10-CM | POA: Diagnosis not present

## 2023-04-09 DIAGNOSIS — E876 Hypokalemia: Secondary | ICD-10-CM | POA: Diagnosis not present

## 2023-04-09 DIAGNOSIS — N3941 Urge incontinence: Secondary | ICD-10-CM

## 2023-04-09 DIAGNOSIS — I8290 Acute embolism and thrombosis of unspecified vein: Secondary | ICD-10-CM | POA: Diagnosis not present

## 2023-04-09 LAB — CBC
HCT: 30.8 % — ABNORMAL LOW (ref 36.0–46.0)
Hemoglobin: 10.5 g/dL — ABNORMAL LOW (ref 12.0–15.0)
MCH: 27.5 pg (ref 26.0–34.0)
MCHC: 34.1 g/dL (ref 30.0–36.0)
MCV: 80.6 fL (ref 80.0–100.0)
Platelets: 350 10*3/uL (ref 150–400)
RBC: 3.82 MIL/uL — ABNORMAL LOW (ref 3.87–5.11)
RDW: 16.6 % — ABNORMAL HIGH (ref 11.5–15.5)
WBC: 7.6 10*3/uL (ref 4.0–10.5)
nRBC: 0 % (ref 0.0–0.2)

## 2023-04-09 LAB — BASIC METABOLIC PANEL
Anion gap: 11 (ref 5–15)
BUN: 9 mg/dL (ref 8–23)
CO2: 25 mmol/L (ref 22–32)
Calcium: 7.6 mg/dL — ABNORMAL LOW (ref 8.9–10.3)
Chloride: 103 mmol/L (ref 98–111)
Creatinine, Ser: 0.49 mg/dL (ref 0.44–1.00)
GFR, Estimated: >60 mL/min (ref >60)
Glucose, Bld: 111 mg/dL — ABNORMAL HIGH (ref 70–99)
Potassium: 3 mmol/L — ABNORMAL LOW (ref 3.5–5.1)
Sodium: 139 mmol/L (ref 135–145)

## 2023-04-09 LAB — MAGNESIUM: Magnesium: 1.7 mg/dL (ref 1.7–2.4)

## 2023-04-09 MED ORDER — METOPROLOL SUCCINATE ER 50 MG PO TB24
50.0000 mg | ORAL_TABLET | Freq: Every day | ORAL | Status: DC
  Administered 2023-04-10: 50 mg via ORAL
  Filled 2023-04-09 (×2): qty 1

## 2023-04-09 MED ORDER — POTASSIUM CHLORIDE IN NACL 20-0.9 MEQ/L-% IV SOLN
INTRAVENOUS | Status: DC
  Filled 2023-04-09: qty 1000

## 2023-04-09 MED ORDER — LOSARTAN POTASSIUM 50 MG PO TABS
100.0000 mg | ORAL_TABLET | Freq: Every day | ORAL | Status: DC
  Administered 2023-04-10 – 2023-04-14 (×5): 100 mg via ORAL
  Filled 2023-04-09 (×5): qty 2

## 2023-04-09 MED ORDER — LOSARTAN POTASSIUM 50 MG PO TABS
50.0000 mg | ORAL_TABLET | Freq: Once | ORAL | Status: AC
  Administered 2023-04-09: 50 mg via ORAL
  Filled 2023-04-09: qty 1

## 2023-04-09 MED ORDER — LABETALOL HCL 5 MG/ML IV SOLN
10.0000 mg | INTRAVENOUS | Status: DC | PRN
  Administered 2023-04-09 – 2023-04-11 (×3): 10 mg via INTRAVENOUS
  Filled 2023-04-09 (×3): qty 4

## 2023-04-09 MED ORDER — LOPERAMIDE HCL 2 MG PO CAPS: 2.0000 mg | ORAL_CAPSULE | ORAL | Status: DC | PRN

## 2023-04-09 MED ORDER — METOPROLOL SUCCINATE ER 25 MG PO TB24
25.0000 mg | ORAL_TABLET | Freq: Once | ORAL | Status: AC
  Administered 2023-04-09: 25 mg via ORAL
  Filled 2023-04-09: qty 1

## 2023-04-09 MED ORDER — MAGNESIUM SULFATE 2 GM/50ML IV SOLN
2.0000 g | Freq: Once | INTRAVENOUS | Status: AC
  Administered 2023-04-09: 2 g via INTRAVENOUS
  Filled 2023-04-09: qty 50

## 2023-04-09 NOTE — Plan of Care (Signed)

## 2023-04-09 NOTE — Progress Notes (Signed)
PROGRESS NOTE    Lisa [name]  YTK:[id number] DOB: Mar 21, 1946 DOA: 04/07/2023 PCP: Alba Cory, MD    Brief Narrative:  77 year old female admitted to the hospitalist service for management and evaluation of progressive nausea, weakness, poor p.o. tolerance, persistent loose stools. No patient is been seen in the past for similar circumstances. This is a largely nutritional/functional in nature.    Assessment & Plan:   Principal Problem:   Hypokalemia due to excessive gastrointestinal loss of potassium Active Problems:   Hypokalemia   Acute deep vein thrombosis (DVT) of left upper extremity (HCC)   Hypertensive urgency   Dyslipidemia   Type 2 diabetes mellitus without complications (HCC)   Chronic diastolic heart failure (HCC)   Malnutrition of moderate degree  Hypokalemia Hypomagnesemia Suspect these are secondary to malnutrition and chronic diarrhea.  Electrolytes being replaced aggressively. Plan: Monitor and replace electrolytes aggressively  Nonocclusive DVT in left upper extremity Superficial thrombophlebitis left upper extremity Started initially on intravenous heparin however given that the DVT is nonocclusive and upper extremity and superficial thrombophlebitis does not require anticoagulation will discontinue Plan: Elevate extremity Place warm compress  Essential hypertension Blood pressure control improved Continue current regimen  Hyperlipidemia Statin  Type 2 diabetes mellitus without complications Okay for regular diet Continue Januvia  Chronic diastolic congestive heart failure No evidence of exacerbation Continue beta-blocker  Chronic diarrhea Poor p.o. intake Moderate malnutrition Functional decline Seen in consultation by registered dietitian.  Recommendations appreciated. Low suspicion for infectious diarrhea Plan: As needed Imodium Daily psyllium Supplemental shakes Palliative care consult PT and OT consults    DVT  prophylaxis: Lovenox Code Status: DNR Family Communication: None Disposition Plan: Status is: Inpatient Remains inpatient appropriate because: Multiple acute issues as above   Level of care: Med-Surg  Consultants:  Palliative care  Procedures:  None  Antimicrobials: None   Subjective: Seen him in.  Reports she had a bad night.  To frustrated about perceived slow response time of staff to call bell.  Objective: Vitals:   04/08/23 2009 04/09/23 0046 04/09/23 0500 04/09/23 0743  BP: (!) 157/77 (!) 190/84 (!) 146/71 (!) 167/78  Pulse: 70 74 75 72  Resp: 16 18 18    Temp: 98.6 F (37 C) 99.5 F (37.5 C) 99.1 F (37.3 C) 98.9 F (37.2 C)  TempSrc:      SpO2: 94% 94% 100% 98%  Height:        Intake/Output Summary (Last 24 hours) at 04/09/2023 1031 Last data filed at 04/09/2023 0600 Gross per 24 hour  Intake 1428.51 ml  Output --  Net 1428.51 ml   There were no vitals filed for this visit.  Examination:  General exam: NAD.  Appears fatigued Respiratory system: Clear to auscultation. Respiratory effort normal. Cardiovascular system: S1-S2, RRR, no murmurs, no pedal edema Gastrointestinal system: Soft, NT/ND, normal bowel sounds Central nervous system: Alert and oriented. No focal neurological deficits. Extremities: Symmetric 5 x 5 power. Skin: No rashes, lesions or ulcers Psychiatry: Judgement and insight appear impaired. Mood & affect flattened.     Data Reviewed: I have personally reviewed following labs and imaging studies  CBC: Recent Labs  Lab 04/07/23 2154 04/08/23 0640 04/09/23 0551  WBC 11.7* 8.8 7.6  NEUTROABS 10.2*  --   --   HGB 12.5 10.7* 10.5*  HCT 38.3 32.1* 30.8*  MCV 85.1 83.6 80.6  PLT 377 350 350   Basic Metabolic Panel: Recent Labs  Lab 04/07/23 2154 04/07/23 2201 04/08/23 0640 04/09/23 0551  NA 138  --  136 139  K 2.1*  --  2.2* 3.0*  CL 93*  --  95* 103  CO2 32  --  29 25  GLUCOSE 175*  --  135* 111*  BUN 8  --  7* 9   CREATININE 0.65  --  0.45 0.49  CALCIUM 7.6*  --  6.8* 7.6*  MG  --  1.4*  --  1.7   GFR: CrCl cannot be calculated (Unknown ideal weight.). Liver Function Tests: Recent Labs  Lab 04/07/23 2154  AST 23  ALT 17  ALKPHOS 97  BILITOT 1.0  PROT 6.8  ALBUMIN 2.7*   Recent Labs  Lab 04/07/23 2154  LIPASE 21   No results for input(s): "AMMONIA" in the last 168 hours. Coagulation Profile: Recent Labs  Lab 04/08/23 0114  INR 1.2   Cardiac Enzymes: No results for input(s): "CKTOTAL", "CKMB", "CKMBINDEX", "TROPONINI" in the last 168 hours. BNP (last 3 results) No results for input(s): "PROBNP" in the last 8760 hours. HbA1C: No results for input(s): "HGBA1C" in the last 72 hours. CBG: No results for input(s): "GLUCAP" in the last 168 hours. Lipid Profile: No results for input(s): "CHOL", "HDL", "LDLCALC", "TRIG", "CHOLHDL", "LDLDIRECT" in the last 72 hours. Thyroid Function Tests: No results for input(s): "TSH", "T4TOTAL", "FREET4", "T3FREE", "THYROIDAB" in the last 72 hours. Anemia Panel: Recent Labs    04/08/23 1430  FOLATE 28.0   Sepsis Labs: No results for input(s): "PROCALCITON", "LATICACIDVEN" in the last 168 hours.  No results found for this or any previous visit (from the past 240 hour(s)).       Radiology Studies: CT Angio Chest PE W and/or Wo Contrast  Result Date: 04/08/2023 CLINICAL DATA:  Shortness of breath. History of clot in the jugular. EXAM: CT ANGIOGRAPHY CHEST WITH CONTRAST TECHNIQUE: Multidetector CT imaging of the chest was performed using the standard protocol during bolus administration of intravenous contrast. Multiplanar CT image reconstructions and MIPs were obtained to evaluate the vascular anatomy. RADIATION DOSE REDUCTION: This exam was performed according to the departmental dose-optimization program which includes automated exposure control, adjustment of the mA and/or kV according to patient size and/or use of iterative reconstruction  technique. CONTRAST:  60mL OMNIPAQUE IOHEXOL 350 MG/ML SOLN COMPARISON:  None Available. FINDINGS: Cardiovascular: Satisfactory opacification of the pulmonary arteries to the segmental level. No evidence of pulmonary embolism. Cardiac enlargement with small low-density pericardial effusion. Extensive atheromatous calcification of the aorta and great vessels. Mediastinum/Nodes: Middle to posterior mediastinal mass measuring approximately 4 by 4 by 3 cm, separate and displacing the esophagus anteriorly into the left. Scalloping is seen of the underlying vertebral body. Lungs/Pleura: Interlobular septal thickening and small layering pleural effusions with adjacent lower lobe atelectasis. Patchy ground-glass opacity in the right upper and right middle lobes. Diffuse airway cuffing. Upper Abdomen: No acute finding Musculoskeletal: Generalized thoracic disc collapse and endplate spurring. No acute or aggressive finding. Mediastinal mass causes scalloping of the T4 and T5 vertebrae that is smooth and benign appearing on reformats. Anasarca. Review of the MIP images confirms the above findings. IMPRESSION: 1. CHF/volume overload. Airspace opacity in the right middle and upper lobes could be alveolar edema or infection. 2. Negative for pulmonary embolism. 3. 4 x 4 x 3 cm mediastinal mass with benign scalloping of the adjacent thoracic spine compatible with benign process such as duplication cyst or nerve sheath tumor. After convalescence, enhanced thoracic CT or MRI could further assess. 4. Extensive atherosclerosis, including the coronary arteries. Electronically Signed   By: Christiane Ha  Watts M.D.   On: 04/08/2023 04:27   US Venous Img Upper Uni Left  Result Date: 04/08/2023 CLINICAL DATA:  Left upper extremity swelling. EXAM: LEFT UPPER EXTREMITY VENOUS DOPPLER ULTRASOUND TECHNIQUE: Gray-scale sonography with graded compression, as well as color Doppler and duplex ultrasound were performed to evaluate the upper  extremity deep venous system from the level of the subclavian vein and including the jugular, axillary, basilic, radial, ulnar and upper cephalic vein. Spectral Doppler was utilized to evaluate flow at rest and with distal augmentation maneuvers. COMPARISON:  None Available. FINDINGS: Contralateral Subclavian Vein: Respiratory phasicity is normal and symmetric with the symptomatic side. No evidence of thrombus. Normal compressibility. Internal Jugular Vein: Evidence of nonocclusive thrombus with abnormal compressibility, respiratory phasicity and response to augmentation. Subclavian Vein: No evidence of thrombus. Normal compressibility, respiratory phasicity and response to augmentation. Axillary Vein: No evidence of thrombus. Normal compressibility, respiratory phasicity and response to augmentation. Cephalic Vein: No evidence of thrombus. Normal compressibility, respiratory phasicity and response to augmentation. Basilic Vein: No evidence of thrombus. Normal compressibility, respiratory phasicity and response to augmentation. Brachial Veins: Evidence of occlusive thrombus with abnormal compressibility, respiratory phasicity and response to augmentation. Radial Veins: No evidence of thrombus. Normal compressibility, respiratory phasicity and response to augmentation. Ulnar Veins: No evidence of thrombus. Normal compressibility, respiratory phasicity and response to augmentation. Venous Reflux:  None visualized. Other Findings:  None visualized. IMPRESSION: 1. Nonocclusive DVT within the LEFT internal jugular vein. 2. Occlusive superficial thrombophlebitis involving the LEFT basilic vein. Electronically Signed   By: Aram Candela M.D.   On: 04/08/2023 00:40   DG Chest Portable 1 View  Result Date: 04/08/2023 CLINICAL DATA:  Chest pain and shortness of breath. EXAM: PORTABLE CHEST 1 VIEW COMPARISON:  April 09, 2021 FINDINGS: The cardiac silhouette is mildly enlarged and unchanged in size. There is marked  severity calcification of the thoracic aorta. Mild, diffuse, chronic appearing increased lung markings are seen with mild atelectasis and/or infiltrate noted within the retrocardiac region of the left lung base. No pleural effusion or pneumothorax is identified. Multilevel degenerative changes seen throughout the thoracic spine. IMPRESSION: Stable cardiomegaly with mild left basilar atelectasis and/or infiltrate. Electronically Signed   By: Aram Candela M.D.   On: 04/08/2023 00:36   CT Head Wo Contrast  Result Date: 04/08/2023 CLINICAL DATA:  Intermittent confusion, markedly elevated blood pressure. Query hemorrhagic CVA. Nausea and vomiting. EXAM: CT HEAD WITHOUT CONTRAST TECHNIQUE: Contiguous axial images were obtained from the base of the skull through the vertex without intravenous contrast. RADIATION DOSE REDUCTION: This exam was performed according to the departmental dose-optimization program which includes automated exposure control, adjustment of the mA and/or kV according to patient size and/or use of iterative reconstruction technique. COMPARISON:  CT and MRI 02/26/2021 FINDINGS: Brain: No intracranial hemorrhage, mass effect, or evidence of acute infarct. No hydrocephalus. No extra-axial fluid collection. Age-commensurate cerebral atrophy and chronic small vessel ischemic disease. Physiologic basal ganglia calcifications. Vascular: No hyperdense vessel. Intracranial arterial calcification. Skull: No fracture or focal lesion. Sinuses/Orbits: No acute finding.  Right globe prosthesis. Other: None. IMPRESSION: No acute intracranial abnormality. Electronically Signed   By: Minerva Fester M.D.   On: 04/08/2023 00:14        Scheduled Meds:  aspirin EC  81 mg Oral Daily   atropine  1 drop Left Eye TID   brimonidine  1 drop Both Eyes BID   citalopram  10 mg Oral Daily   clopidogrel  75 mg Oral Daily  dapagliflozin propanediol  10 mg Oral Daily   dorzolamide-timolol  1 drop Both Eyes BID    feeding supplement  237 mL Oral BID BM   linagliptin  5 mg Oral Daily   losartan  50 mg Oral Daily   metoprolol succinate  25 mg Oral Daily   mirabegron ER  25 mg Oral Daily   multivitamin with minerals  1 tablet Oral Daily   potassium chloride SA  40 mEq Oral BID   prednisoLONE acetate  1 drop Left Eye QID   psyllium  1 packet Oral Daily   rosuvastatin  10 mg Oral Daily   vitamin B-12  1,000 mcg Oral Daily   Vitamin D (Ergocalciferol)  50,000 Units Oral Q7 days   Continuous Infusions:  magnesium sulfate bolus IVPB       LOS: 1 day   Tresa Moore, MD [facility]   If 7PM-7AM, please contact night-coverage  04/09/2023, 10:31 AM

## 2023-04-09 NOTE — Progress Notes (Signed)
Daily Progress Note   Patient Name: Lisa [name]       Date: 04/09/2023 DOB: 1946/01/03  Age: 77 y.o. MRN#: [id number] Attending Physician: Lisa Moore, MD Primary Care Physician: Lisa Cory, MD Admit Date: 04/07/2023  Reason for Consultation/Follow-up: Establishing goals of care  Subjective: Not feeling well, tired, dissatisfied with care  Length of Stay: 1  Current Medications: Scheduled Meds:   aspirin EC  81 mg Oral Daily   atropine  1 drop Left Eye TID   brimonidine  1 drop Both Eyes BID   citalopram  10 mg Oral Daily   clopidogrel  75 mg Oral Daily   dapagliflozin propanediol  10 mg Oral Daily   dorzolamide-timolol  1 drop Both Eyes BID   feeding supplement  237 mL Oral BID BM   linagliptin  5 mg Oral Daily   losartan  50 mg Oral Daily   metoprolol succinate  25 mg Oral Daily   mirabegron ER  25 mg Oral Daily   multivitamin with minerals  1 tablet Oral Daily   potassium chloride SA  40 mEq Oral BID   prednisoLONE acetate  1 drop Left Eye QID   psyllium  1 packet Oral Daily   rosuvastatin  10 mg Oral Daily   vitamin B-12  1,000 mcg Oral Daily   Vitamin D (Ergocalciferol)  50,000 Units Oral Q7 days    Continuous Infusions:  magnesium sulfate bolus IVPB      PRN Meds: acetaminophen **OR** acetaminophen, hydrALAZINE, loperamide, metoCLOPramide (REGLAN) injection, nitroGLYCERIN, traZODone  Physical Exam Constitutional:      General: She is not in acute distress.    Appearance: She is ill-appearing.  Pulmonary:     Effort: Pulmonary effort is normal.  Skin:    General: Skin is warm and dry.  Neurological:     Mental Status: She is alert and oriented to person, place, and time.             Vital Signs: BP (!) 167/78 (BP Location: Right Arm)   Pulse 72    Temp 98.9 F (37.2 C)   Resp 18   Ht 5\' 4"  (1.626 m)   SpO2 98%   BMI 20.25 kg/m  SpO2: SpO2: 98 % O2 Device: O2 Device: Nasal Cannula O2 Flow Rate: O2 Flow Rate (L/min): 1 L/min  Intake/output summary:  Intake/Output Summary (Last 24 hours) at 04/09/2023 1115 Last data filed at 04/09/2023 0600 Gross per 24 hour  Intake 1428.51 ml  Output --  Net 1428.51 ml   LBM:   Baseline Weight:   Most recent weight:         Palliative Assessment/Data: PPS 50%      Patient Active Problem List   Diagnosis Date Noted   Hypokalemia 04/08/2023   Acute deep vein thrombosis (DVT) of left upper extremity (HCC) 04/08/2023   Hypertensive urgency 04/08/2023   Type 2 diabetes mellitus without complications (HCC) 04/08/2023   Hypokalemia due to excessive gastrointestinal loss of potassium 04/08/2023   Malnutrition of moderate degree 04/08/2023   Unilateral amputation of left foot (HCC) 02/19/2022   Hypertension associated with type 2 diabetes mellitus (HCC) 02/19/2022   Chronic diastolic heart failure (HCC)  04/09/2021   Nicotine dependence 04/09/2021   Syncope and collapse 02/26/2021   Prolonged QT interval 02/26/2021   DM (diabetes mellitus), type 2 with peripheral vascular complications (HCC) 09/25/2015   Amputation of left foot (HCC) 12/02/2014   Benign essential HTN 12/02/2014   Depression, major, recurrent, mild (HCC) 12/02/2014   Dyslipidemia 12/02/2014   Vitamin B12 deficiency 12/02/2014   Peripheral neuropathic pain 12/02/2014   Peripheral artery disease (HCC) 12/02/2014   Phantom limb (HCC) 12/02/2014   Diabetes mellitus with nephropathy (HCC) 12/02/2014   Detached retina 12/02/2014   Type 2 diabetes mellitus with peripheral neuropathy (HCC) 12/02/2014   Tobacco use 12/02/2014   Urge incontinence 12/02/2014   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   Vitamin D deficiency 12/02/2014    Palliative Care Assessment & Plan   HPI: 77 y.o. female  admitted on 04/07/2023 with   medical  history significant for CHF, type 2 diabetes mellitus, lower extremity DVT, depression, hypertension, dyslipidemia, grade 2 diastolic dysfunction and right eye blindness, who presented to the emergency room with acute onset of nausea and generalized weakness over the last 2 to 3 days.     She has been fairly sleepy all the time.  No chest pain or palpitations.  She has been having nonproductive cough with occasional wheezing without dyspnea.  She has been having urinary frequency and frequent watery bowel movements.  No paresthesias or focal muscle weakness.  She has been having swelling of the left upper extremity that is new for her with associated pain for the last 3 days.  No dysuria, oliguria or hematuria or flank pain.  No bleeding diathesis.  ED Course: When she came to the ER, BP was elevated 250/104 with otherwise normal vital signs.  Labs revealed severe hypokalemia of 2.1 and hypomagnesemia 1.4.  Blood glucose was 175 with albumin of 2.7 otherwise unremarkable CMP.  BNP was 840.1 and high sensitive troponin was 35.  CBC showed mild leukocytosis of 11.7 with neutrophilia.   Imaging: Portable chest x-ray showed stable cardiomegaly and mild left basilar atelectasis and/or infiltrate.  Chest CTA is currently pending. Left upper extremity venous Doppler revealed: 1. Nonocclusive DVT within the LEFT internal jugular vein. 2. Occlusive superficial thrombophlebitis involving the LEFT basilic vein.   Patient faces treatment option decision, advanced directive decision and anticipatory care needs.    Assessment: Follow up today with patient following her initial goals of care discussion with Lisa [name] yesterday. During that discussion DNR/DNI was established and patient wanted her bother Lisa [name] to serve as decision maker if she were unable however she did not wish to complete HCPOA/living will.  Follow up today patient has multiple complaints - feels unwell. Worried her oxygen was low -  checked with RN and saturations 97-98% on room air. Discussed with patient. She is going to try to get some rest. She has no specific complaints other than feeling unwell and dissatisfied with care. Nurse at bedside attending to needs.   Discussed that continuing goals of care discussions will be important throughout hospitalization. We will follow along PRN. She is unsure if she will be able to dc home or to SNF though she has concerns about going to SNF.   Recommendations/Plan: DNR/DNI per discussion 11/13 Patient would like brother Lisa [name] to serve as decision maker if she is unable PMT to follow along PRN Considering dc to SNF? Needs more support at home, [facility] has been consulted  Care plan was discussed with patient and Dr. Adella Hare, discussed  with RN as well  Thank you for allowing the Palliative Medicine Team to assist in the care of this patient.   Total Time 30 minutes Prolonged Time Billed  no   Time spent includes: Detailed review of medical records (labs, imaging, vital signs), medically appropriate exam, discussion with treatment team, counseling and educating patient, family and/or staff, documenting clinical information, medication management and coordination of care.     *Please note that this is a verbal dictation therefore any spelling or grammatical errors are due to the "[employer] One" system interpretation.  Gerlean Ren, DNP, [facility] Palliative Medicine Team Team Phone # [phone]  Pager [phone]

## 2023-04-10 DIAGNOSIS — E876 Hypokalemia: Secondary | ICD-10-CM | POA: Diagnosis not present

## 2023-04-10 LAB — CBC WITH DIFFERENTIAL/PLATELET
Abs Immature Granulocytes: 0.05 10*3/uL (ref 0.00–0.07)
Basophils Absolute: 0 10*3/uL (ref 0.0–0.1)
Basophils Relative: 0 %
Eosinophils Absolute: 0 10*3/uL (ref 0.0–0.5)
Eosinophils Relative: 1 %
HCT: 31.9 % — ABNORMAL LOW (ref 36.0–46.0)
Hemoglobin: 10.5 g/dL — ABNORMAL LOW (ref 12.0–15.0)
Immature Granulocytes: 1 %
Lymphocytes Relative: 14 %
Lymphs Abs: 0.8 10*3/uL (ref 0.7–4.0)
MCH: 27.6 pg (ref 26.0–34.0)
MCHC: 32.9 g/dL (ref 30.0–36.0)
MCV: 83.9 fL (ref 80.0–100.0)
Monocytes Absolute: 0.4 10*3/uL (ref 0.1–1.0)
Monocytes Relative: 6 %
Neutro Abs: 4.6 10*3/uL (ref 1.7–7.7)
Neutrophils Relative %: 78 %
Platelets: 323 10*3/uL (ref 150–400)
RBC: 3.8 MIL/uL — ABNORMAL LOW (ref 3.87–5.11)
RDW: 16.8 % — ABNORMAL HIGH (ref 11.5–15.5)
WBC: 5.9 10*3/uL (ref 4.0–10.5)
nRBC: 0 % (ref 0.0–0.2)

## 2023-04-10 LAB — BASIC METABOLIC PANEL
Anion gap: 9 (ref 5–15)
BUN: 11 mg/dL (ref 8–23)
CO2: 25 mmol/L (ref 22–32)
Calcium: 8.5 mg/dL — ABNORMAL LOW (ref 8.9–10.3)
Chloride: 107 mmol/L (ref 98–111)
Creatinine, Ser: 0.75 mg/dL (ref 0.44–1.00)
GFR, Estimated: >60 mL/min (ref >60)
Glucose, Bld: 132 mg/dL — ABNORMAL HIGH (ref 70–99)
Potassium: 3.7 mmol/L (ref 3.5–5.1)
Sodium: 141 mmol/L (ref 135–145)

## 2023-04-10 LAB — ZINC: Zinc: 36 ug/dL — ABNORMAL LOW (ref 44–115)

## 2023-04-10 MED ORDER — AMLODIPINE BESYLATE 10 MG PO TABS
10.0000 mg | ORAL_TABLET | Freq: Every day | ORAL | Status: DC
  Administered 2023-04-10 – 2023-04-14 (×5): 10 mg via ORAL
  Filled 2023-04-10 (×5): qty 1

## 2023-04-10 MED ORDER — HYDRALAZINE HCL 50 MG PO TABS
25.0000 mg | ORAL_TABLET | Freq: Three times a day (TID) | ORAL | Status: DC
  Administered 2023-04-10 – 2023-04-11 (×3): 25 mg via ORAL
  Filled 2023-04-10 (×3): qty 1

## 2023-04-10 NOTE — Progress Notes (Signed)
PT Cancellation Note  Patient Details Name: Arya Spare MRN: [id number] DOB: 04-14-1946   Cancelled Treatment:    Reason Eval/Treat Not Completed: Patient not medically ready. Pt not medically cleared secondary to high BP reading at beginning of session: BP-197/92 (121) HR 68bpm. Will attempt PT evaluation at a later time.   Jennea Rager 04/10/2023, 10:46 AM

## 2023-04-10 NOTE — Progress Notes (Signed)
PROGRESS NOTE    Lisa [name]  DGU:[id number] DOB: 06/29/1945 DOA: 04/07/2023 PCP: Alba Cory, MD    Brief Narrative:  77 year old female admitted to the hospitalist service for management and evaluation of progressive nausea, weakness, poor p.o. tolerance, persistent loose stools. No patient is been seen in the past for similar circumstances. This is a largely nutritional/functional in nature.    Assessment & Plan:   Principal Problem:   Hypokalemia due to excessive gastrointestinal loss of potassium Active Problems:   Hypokalemia   Acute deep vein thrombosis (DVT) of left upper extremity (HCC)   Hypertensive urgency   Dyslipidemia   Type 2 diabetes mellitus without complications (HCC)   Chronic diastolic heart failure (HCC)   Malnutrition of moderate degree  Hypokalemia Hypomagnesemia Suspect these are secondary to malnutrition and chronic diarrhea.  Electrolytes being replaced aggressively. Plan: Monitor and replace electrolytes aggressively Electrolytes have improved  Nonocclusive DVT in left upper extremity Superficial thrombophlebitis left upper extremity Started initially on intravenous heparin however given that the DVT is nonocclusive and upper extremity and superficial thrombophlebitis does not require anticoagulation will discontinue Plan: Elevate extremity Place warm compress No indication for therapeutic anticoagulation  Essential hypertension Poor control Plan: Amlodipine 10mg  every day Hydralazine 25 q8h Cozaar 100mg  daily Toprol xl 50mg  every day   Hyperlipidemia Statin  Type 2 diabetes mellitus without complications Okay for regular diet Continue Januvia  Chronic diastolic congestive heart failure No evidence of exacerbation Continue beta-blocker  Chronic diarrhea Poor p.o. intake Moderate malnutrition Functional decline Seen in consultation by registered dietitian.  Recommendations appreciated. Low suspicion for infectious  diarrhea Plan: As needed Imodium Daily psyllium Supplemental shakes Palliative care consult PT and OT consults    DVT prophylaxis: Lovenox Code Status: DNR Family Communication: None Disposition Plan: Status is: Inpatient Remains inpatient appropriate because: Multiple acute issues as above   Level of care: Med-Surg  Consultants:  Palliative care  Procedures:  None  Antimicrobials: None   Subjective: Seen and examined.  No acute overnight events.  Reports "feeling bad"  Objective: Vitals:   04/10/23 0045 04/10/23 0523 04/10/23 0855 04/10/23 1218  BP: (!) 181/86 (!) 180/90 (!) 197/92 (!) 203/78  Pulse: 65 61 65 65  Resp: 17 18 20 18   Temp: 97.9 F (36.6 C) 97.6 F (36.4 C) 98.7 F (37.1 C) 97.7 F (36.5 C)  TempSrc:   Oral Oral  SpO2: 98% 100% 97% 99%  Height:       No intake or output data in the 24 hours ending 04/10/23 1433  There were no vitals filed for this visit.  Examination:  General exam: Appears fatigued Respiratory system: Lungs clear, normal WOB, RA Cardiovascular system: S1-S2, RRR, no murmurs, no pedal edema Gastrointestinal system: Soft, NT/ND, normal bowel sounds Central nervous system: Alert and oriented. No focal neurological deficits. Extremities: Symmetric 5 x 5 power. Skin: No rashes, lesions or ulcers Psychiatry: Judgement and insight appear impaired. Mood & affect flattened.     Data Reviewed: I have personally reviewed following labs and imaging studies  CBC: Recent Labs  Lab 04/07/23 2154 04/08/23 0640 04/09/23 0551 04/10/23 0419  WBC 11.7* 8.8 7.6 5.9  NEUTROABS 10.2*  --   --  4.6  HGB 12.5 10.7* 10.5* 10.5*  HCT 38.3 32.1* 30.8* 31.9*  MCV 85.1 83.6 80.6 83.9  PLT 377 350 350 323   Basic Metabolic Panel: Recent Labs  Lab 04/07/23 2154 04/07/23 2201 04/08/23 0640 04/09/23 0551 04/10/23 0419  NA 138  --  136 139 141  K 2.1*  --  2.2* 3.0* 3.7  CL 93*  --  95* 103 107  CO2 32  --  29 25 25   GLUCOSE  175*  --  135* 111* 132*  BUN 8  --  7* 9 11  CREATININE 0.65  --  0.45 0.49 0.75  CALCIUM 7.6*  --  6.8* 7.6* 8.5*  MG  --  1.4*  --  1.7  --    GFR: CrCl cannot be calculated (Unknown ideal weight.). Liver Function Tests: Recent Labs  Lab 04/07/23 2154  AST 23  ALT 17  ALKPHOS 97  BILITOT 1.0  PROT 6.8  ALBUMIN 2.7*   Recent Labs  Lab 04/07/23 2154  LIPASE 21   No results for input(s): "AMMONIA" in the last 168 hours. Coagulation Profile: Recent Labs  Lab 04/08/23 0114  INR 1.2   Cardiac Enzymes: No results for input(s): "CKTOTAL", "CKMB", "CKMBINDEX", "TROPONINI" in the last 168 hours. BNP (last 3 results) No results for input(s): "PROBNP" in the last 8760 hours. HbA1C: No results for input(s): "HGBA1C" in the last 72 hours. CBG: No results for input(s): "GLUCAP" in the last 168 hours. Lipid Profile: No results for input(s): "CHOL", "HDL", "LDLCALC", "TRIG", "CHOLHDL", "LDLDIRECT" in the last 72 hours. Thyroid Function Tests: No results for input(s): "TSH", "T4TOTAL", "FREET4", "T3FREE", "THYROIDAB" in the last 72 hours. Anemia Panel: Recent Labs    04/08/23 1430  FOLATE 28.0   Sepsis Labs: No results for input(s): "PROCALCITON", "LATICACIDVEN" in the last 168 hours.  No results found for this or any previous visit (from the past 240 hour(s)).       Radiology Studies: No results found.      Scheduled Meds:  amLODipine  10 mg Oral Daily   aspirin EC  81 mg Oral Daily   atropine  1 drop Left Eye TID   brimonidine  1 drop Both Eyes BID   citalopram  10 mg Oral Daily   clopidogrel  75 mg Oral Daily   cyanocobalamin  1,000 mcg Oral Daily   dapagliflozin propanediol  10 mg Oral Daily   dorzolamide-timolol  1 drop Both Eyes BID   feeding supplement  237 mL Oral BID BM   hydrALAZINE  25 mg Oral Q8H   linagliptin  5 mg Oral Daily   losartan  100 mg Oral Daily   metoprolol succinate  50 mg Oral Daily   mirabegron ER  25 mg Oral Daily    multivitamin with minerals  1 tablet Oral Daily   potassium chloride SA  40 mEq Oral BID   prednisoLONE acetate  1 drop Left Eye QID   psyllium  1 packet Oral Daily   rosuvastatin  10 mg Oral Daily   Vitamin D (Ergocalciferol)  50,000 Units Oral Q7 days   Continuous Infusions:     LOS: 2 days   Tresa Moore, MD [facility]   If 7PM-7AM, please contact night-coverage  04/10/2023, 2:33 PM

## 2023-04-10 NOTE — Progress Notes (Signed)
OT Cancellation Note  Patient Details Name: Lisa [name] MRN: [id number] DOB: Nov 07, 1945   Cancelled Treatment:    Reason Eval/Treat Not Completed: Medical issues which prohibited therapy. OT order received and chart reviewed. On arrival pt in chair, vitals taken with BP outside of safe parameters for therapy evlauation at this time 197/92, 121). RN notified. Will follow and evaluate when medically appropriate.  Kathie Dike, M.S. OTR/L  04/10/23, 10:53 AM  ascom [phone]

## 2023-04-10 NOTE — Progress Notes (Signed)
PT Cancellation Note  Patient Details Name: Lisa [name] MRN: [id number] DOB: [birth year]   Cancelled Treatment:    Reason Eval/Treat Not Completed: Patient not medically ready. Re-assessed BP following RN administered meds with readings of BP 212/99 (131) HR-66bpm. Will attempt PT eval at a later time.    Caren Garske 04/10/2023, 2:05 PM

## 2023-04-11 DIAGNOSIS — E876 Hypokalemia: Secondary | ICD-10-CM | POA: Diagnosis not present

## 2023-04-11 LAB — BASIC METABOLIC PANEL
Anion gap: 8 (ref 5–15)
BUN: 9 mg/dL (ref 8–23)
CO2: 22 mmol/L (ref 22–32)
Calcium: 8.6 mg/dL — ABNORMAL LOW (ref 8.9–10.3)
Chloride: 105 mmol/L (ref 98–111)
Creatinine, Ser: 0.64 mg/dL (ref 0.44–1.00)
GFR, Estimated: >60 mL/min (ref >60)
Glucose, Bld: 129 mg/dL — ABNORMAL HIGH (ref 70–99)
Potassium: 5.7 mmol/L — ABNORMAL HIGH (ref 3.5–5.1)
Sodium: 135 mmol/L (ref 135–145)

## 2023-04-11 LAB — VITAMIN A: Vitamin A (Retinoic Acid): 4.9 ug/dL — ABNORMAL LOW (ref 22.0–69.5)

## 2023-04-11 LAB — MAGNESIUM: Magnesium: 1.8 mg/dL (ref 1.7–2.4)

## 2023-04-11 MED ORDER — HYDRALAZINE HCL 50 MG PO TABS
50.0000 mg | ORAL_TABLET | Freq: Three times a day (TID) | ORAL | Status: DC
  Administered 2023-04-11 – 2023-04-14 (×8): 50 mg via ORAL
  Filled 2023-04-11 (×10): qty 1

## 2023-04-11 MED ORDER — POLYVINYL ALCOHOL 1.4 % OP SOLN: 2.0000 [drp] | OPHTHALMIC | Status: DC | PRN

## 2023-04-11 MED ORDER — HYDRALAZINE HCL 20 MG/ML IJ SOLN
10.0000 mg | INTRAMUSCULAR | Status: DC | PRN
  Filled 2023-04-11: qty 1

## 2023-04-11 MED ORDER — METOPROLOL SUCCINATE ER 25 MG PO TB24
25.0000 mg | ORAL_TABLET | Freq: Every day | ORAL | Status: DC
  Administered 2023-04-11 – 2023-04-13 (×3): 25 mg via ORAL
  Filled 2023-04-11 (×3): qty 1

## 2023-04-11 NOTE — Evaluation (Signed)
Occupational Therapy Evaluation Patient Details Name: Lisa [name] MRN: [id number] DOB: 1945/08/20 Today's Date: 04/11/2023   History of Present Illness Pt is a 77 y.o. female with medical history significant for CHF, type 2 diabetes mellitus, lower extremity DVT, depression, hypertension, dyslipidemia, grade 2 diastolic dysfunction and right eye blindness, who presented to the ED on 04/07/23 with acute onset of nausea and generalized weakness over the last 2 to 3 days.   Clinical Impression   Pt willing to work with OT and PT - in bed lethargic and fatigue per pt - appear did not eat breakfast- and upon putting light on - very sensitive for light - per pt she is blind in R eye and had cataract surgery in L eye- Pt HHA to bathroom and perform hygiene - donning in recliner depend independent including bridging- Pt min A but need time and mod A using railing and min A from OT sit<>stand from toilet - grooming at sink supervision. Pt with decrease strength and act tol with decrease balance - with decrease ind in ADL's and IADL"S - with  decrease vision - would recommend SNF rehab for pt prior to return to prior level of function and home.      If plan is discharge home, recommend the following:      Functional Status Assessment  Patient has had a recent decline in their functional status and demonstrates the ability to make significant improvements in function in a reasonable and predictable amount of time.  Equipment Recommendations       Recommendations for Other Services       Precautions / Restrictions Precautions Precautions: Fall Precaution Comments: Blood pressure was increased up to yesterday.  Causing therapy to hold off Restrictions Weight Bearing Restrictions: No      Mobility Bed Mobility               General bed mobility comments: Min assist Patient Response: Flat affect  Transfers                   General transfer comment: Min assist x 2 sit to  stand from bed.,  Toilet transfers relying mod assist on railing and min assist from OT -hold onto furniture to sink for hygiene-hand-held assist out of bathroom with PT      Balance Overall balance assessment: Needs assistance Sitting-balance support: Feet supported Sitting balance-Leahy Scale: Good     Standing balance support: Single extremity supported Standing balance-Leahy Scale: Fair                             ADL either performed or assessed with clinical judgement   ADL                                         General ADL Comments: Toileting min assist for hygiene and clothing management-hold onto railing..  Needs time.  Upper body dressing min assist lower body dressing mod assist.  Bathing and dressing mod assist because of fatigue.  Eating and grooming standby assist.     Vision Patient Visual Report: No change from baseline Additional Comments: Patient reports being blind in the right eye.  Had recently cataract surgery on the left.  Bothered by bright light.     Perception         Praxis  Pertinent Vitals/Pain Pain Assessment Pain Location: Patient report pain with bowel movement.  No pain number provided.  Reports this has been going on for a while. Pain Descriptors / Indicators: Burning, Discomfort Pain Intervention(s): Limited activity within patient's tolerance     Extremity/Trunk Assessment Upper Extremity Assessment Upper Extremity Assessment: Overall WFL for tasks assessed           Communication Communication Communication: No apparent difficulties   Cognition Arousal: Alert Behavior During Therapy: WFL for tasks assessed/performed Overall Cognitive Status: Within Functional Limits for tasks assessed                                       General Comments       Exercises Other Exercises Other Exercises: toiletting- pt perfom hyiene and LB bathing on commode- - min A and setup - for   sit <> stand from low toilet - mod A pulling up from railing with min A from OT -setup by OT= fatigue needed time and several rest breaks- Other Exercises: Donning depend in recliner - pt ind with setup - over feet and bridging Ind with supervision   Shoulder Instructions      Home Living Family/patient expects to be discharged to:: Private residence Living Arrangements: Alone Available Help at Discharge: Family;Available PRN/intermittently Type of Home: House             Bathroom Shower/Tub: Walk-in shower;Tub/shower unit   Bathroom Toilet: Standard Bathroom Accessibility: Yes   Home Equipment: Agricultural consultant (2 wheels);Cane - single point   Additional Comments: Pt reports her brother still lives with her, but is not there consistently.      Prior Functioning/Environment Prior Level of Function : Independent/Modified Independent             Mobility Comments: SPC use over the past year, but only uses intermittently. ADLs Comments: Pt reports that she drives, but is likely going to stop.  Pt is blind in R eye.        OT Problem List: Decreased strength;Decreased activity tolerance;Impaired balance (sitting and/or standing);Impaired vision/perception;Impaired UE functional use;Decreased knowledge of use of DME or AE;Decreased safety awareness      OT Treatment/Interventions: Self-care/ADL training;Therapeutic exercise;Therapeutic activities;Neuromuscular education;Energy conservation;DME and/or AE instruction;Patient/family education;Visual/perceptual remediation/compensation;Balance training    OT Goals(Current goals can be found in the care plan section) Acute Rehab OT Goals Patient Stated Goal: Get stronger and not so tired OT Goal Formulation: With patient Time For Goal Achievement: 04/18/23 Potential to Achieve Goals: Good  OT Frequency: Min 2X/week    Co-evaluation PT/OT/SLP Co-Evaluation/Treatment: Yes   PT goals addressed during session: Mobility/safety  with mobility;Proper use of DME OT goals addressed during session: ADL's and self-care;Proper use of Adaptive equipment and DME      AM-PAC OT "6 Clicks" Daily Activity     Outcome Measure Help from another person eating meals?: A Little Help from another person taking care of personal grooming?: None Help from another person toileting, which includes using toliet, bedpan, or urinal?: A Little Help from another person bathing (including washing, rinsing, drying)?: A Little Help from another person to put on and taking off regular upper body clothing?: A Little Help from another person to put on and taking off regular lower body clothing?: A Little 6 Click Score: 19   End of Session Equipment Utilized During Treatment: Gait belt  Activity Tolerance: Patient tolerated treatment well;Patient limited  by fatigue;Patient limited by pain Patient left: in chair;with call bell/phone within reach;with chair alarm set  OT Visit Diagnosis: Unsteadiness on feet (R26.81);Low vision, both eyes (H54.2);Muscle weakness (generalized) (M62.81);History of falling (Z91.81)                Time: 6045-4098 OT Time Calculation (min): 44 min Charges:  OT General Charges $OT Visit: 1 Visit OT Treatments $Self Care/Home Management : 8-22 mins    Birch Farino OTR/L,CLT 04/11/2023, 2:52 PM

## 2023-04-11 NOTE — Plan of Care (Signed)
m °

## 2023-04-11 NOTE — Progress Notes (Signed)
PROGRESS NOTE    Lisa [name]  YQI:[id number] DOB: 1945/10/03 DOA: 04/07/2023 PCP: Alba Cory, MD    Brief Narrative:  77 year old female admitted to the hospitalist service for management and evaluation of progressive nausea, weakness, poor p.o. tolerance, persistent loose stools. No patient is been seen in the past for similar circumstances. This is a largely nutritional/functional in nature.    Assessment & Plan:   Principal Problem:   Hypokalemia due to excessive gastrointestinal loss of potassium Active Problems:   Hypokalemia   Acute deep vein thrombosis (DVT) of left upper extremity (HCC)   Hypertensive urgency   Dyslipidemia   Type 2 diabetes mellitus without complications (HCC)   Chronic diastolic heart failure (HCC)   Malnutrition of moderate degree  Hypokalemia Hypomagnesemia Suspect these are secondary to malnutrition and chronic diarrhea.  Electrolytes being replaced aggressively. Plan: Lytes have improved Stop scheduled kdur Repeat labs in AM  Nonocclusive DVT in left upper extremity Superficial thrombophlebitis left upper extremity Started initially on intravenous heparin however given that the DVT is nonocclusive and upper extremity and superficial thrombophlebitis does not require anticoagulation will discontinue Plan: Elevate extremity Place warm compress No indication for therapeutic anticoagulation  Essential hypertension Poor control, refractory to therapies Plan: Amlodipine 10mg  qD Hydralazine 50mg  q8h Cozaar 100mg  daily Toprol xl 50mg  every day   Hyperlipidemia Statin  Type 2 diabetes mellitus without complications Okay for regular diet Continue Januvia  Chronic diastolic congestive heart failure No evidence of exacerbation Continue beta-blocker  Chronic diarrhea Poor p.o. intake Moderate malnutrition Functional decline Seen in consultation by registered dietitian.  Recommendations appreciated. Low suspicion for  infectious diarrhea Plan: As needed Imodium Daily psyllium Supplemental shakes Palliative care consult PT and OT consults TOC consult for SNF placement    DVT prophylaxis: Lovenox Code Status: DNR Family Communication: None Disposition Plan: Status is: Inpatient Remains inpatient appropriate because: Multiple acute issues as above   Level of care: Med-Surg  Consultants:  Palliative care  Procedures:  None  Antimicrobials: None   Subjective: Seen and examined.  No acute events overnight.  No new complaints  Objective: Vitals:   04/11/23 0821 04/11/23 0823 04/11/23 1133 04/11/23 1201  BP: (!) 170/91 (!) 170/91 (!) 186/83 (!) 154/94  Pulse: 69 70 66 67  Resp:   18   Temp:   98.7 F (37.1 C)   TempSrc:      SpO2: 100% 100% 96%   Height:       No intake or output data in the 24 hours ending 04/11/23 1520  There were no vitals filed for this visit.  Examination:  General exam: NAD.  Appears fatigued Respiratory system: Lungs clear, normal WOB, RA Cardiovascular system: S1-S2, RRR, no murmurs, no pedal edema Gastrointestinal system: Soft, NT/ND, normal bowel sounds Central nervous system: Alert and oriented. No focal neurological deficits. Extremities: Symmetric 5 x 5 power. Skin: No rashes, lesions or ulcers Psychiatry: Judgement and insight appear impaired. Mood & affect flattened.     Data Reviewed: I have personally reviewed following labs and imaging studies  CBC: Recent Labs  Lab 04/07/23 2154 04/08/23 0640 04/09/23 0551 04/10/23 0419  WBC 11.7* 8.8 7.6 5.9  NEUTROABS 10.2*  --   --  4.6  HGB 12.5 10.7* 10.5* 10.5*  HCT 38.3 32.1* 30.8* 31.9*  MCV 85.1 83.6 80.6 83.9  PLT 377 350 350 323   Basic Metabolic Panel: Recent Labs  Lab 04/07/23 2154 04/07/23 2201 04/08/23 0640 04/09/23 0551 04/10/23 0419 04/11/23 1425  NA  138  --  136 139 141 135  K 2.1*  --  2.2* 3.0* 3.7 5.7*  CL 93*  --  95* 103 107 105  CO2 32  --  29 25 25 22    GLUCOSE 175*  --  135* 111* 132* 129*  BUN 8  --  7* 9 11 9   CREATININE 0.65  --  0.45 0.49 0.75 0.64  CALCIUM 7.6*  --  6.8* 7.6* 8.5* 8.6*  MG  --  1.4*  --  1.7  --  1.8   GFR: CrCl cannot be calculated (Unknown ideal weight.). Liver Function Tests: Recent Labs  Lab 04/07/23 2154  AST 23  ALT 17  ALKPHOS 97  BILITOT 1.0  PROT 6.8  ALBUMIN 2.7*   Recent Labs  Lab 04/07/23 2154  LIPASE 21   No results for input(s): "AMMONIA" in the last 168 hours. Coagulation Profile: Recent Labs  Lab 04/08/23 0114  INR 1.2   Cardiac Enzymes: No results for input(s): "CKTOTAL", "CKMB", "CKMBINDEX", "TROPONINI" in the last 168 hours. BNP (last 3 results) No results for input(s): "PROBNP" in the last 8760 hours. HbA1C: No results for input(s): "HGBA1C" in the last 72 hours. CBG: No results for input(s): "GLUCAP" in the last 168 hours. Lipid Profile: No results for input(s): "CHOL", "HDL", "LDLCALC", "TRIG", "CHOLHDL", "LDLDIRECT" in the last 72 hours. Thyroid Function Tests: No results for input(s): "TSH", "T4TOTAL", "FREET4", "T3FREE", "THYROIDAB" in the last 72 hours. Anemia Panel: No results for input(s): "VITAMINB12", "FOLATE", "FERRITIN", "TIBC", "IRON", "RETICCTPCT" in the last 72 hours.  Sepsis Labs: No results for input(s): "PROCALCITON", "LATICACIDVEN" in the last 168 hours.  No results found for this or any previous visit (from the past 240 hour(s)).       Radiology Studies: No results found.      Scheduled Meds:  amLODipine  10 mg Oral Daily   aspirin EC  81 mg Oral Daily   atropine  1 drop Left Eye TID   brimonidine  1 drop Both Eyes BID   citalopram  10 mg Oral Daily   clopidogrel  75 mg Oral Daily   cyanocobalamin  1,000 mcg Oral Daily   dapagliflozin propanediol  10 mg Oral Daily   dorzolamide-timolol  1 drop Both Eyes BID   feeding supplement  237 mL Oral BID BM   hydrALAZINE  50 mg Oral Q8H   linagliptin  5 mg Oral Daily   losartan  100 mg  Oral Daily   metoprolol succinate  25 mg Oral Daily   mirabegron ER  25 mg Oral Daily   multivitamin with minerals  1 tablet Oral Daily   prednisoLONE acetate  1 drop Left Eye QID   psyllium  1 packet Oral Daily   rosuvastatin  10 mg Oral Daily   Vitamin D (Ergocalciferol)  50,000 Units Oral Q7 days   Continuous Infusions:     LOS: 3 days   Tresa Moore, MD [facility]   If 7PM-7AM, please contact night-coverage  04/11/2023, 3:20 PM

## 2023-04-11 NOTE — Evaluation (Signed)
Physical Therapy Evaluation Patient Details Name: Lisa [name] MRN: [id number] DOB: 28-Jul-1945 Today's Date: 04/11/2023  History of Present Illness  Pt is a 77 y.o. female with medical history significant for CHF, type 2 diabetes mellitus, lower extremity DVT, depression, hypertension, dyslipidemia, grade 2 diastolic dysfunction and right eye blindness, who presented to the ED on 04/07/23 with acute onset of nausea and generalized weakness over the last 2 to 3 days.   Clinical Impression  Pt received in supine position and agreeable to therapy.  Pt noted she needed to have bedding changed due to voiding in the bed.  Pt transferred to bathroom with OT while author assisted with room set up and changing of bedding.  Pt noted to have considerable pain with voiding as noted by OT.  Pt able to transfer without need of AD however tends to hold onto furniture and walls to stay upright.  Pt ambulated in hallway with HHA and was much more stable.  Pt states she would utilize a cane at home for stability.  Due to pt not having support at home, pt unlikely to be safe at home with transfers at this time.  Pt ambulated around the nursing station with 3 standing rest breaks due to considerable fatigue and then transferred to chair to eat remaining breakfast.  Pt left with all needs met and call bell within reach.         If plan is discharge home, recommend the following: A little help with walking and/or transfers;A little help with bathing/dressing/bathroom;Assistance with cooking/housework;Assist for transportation;Help with stairs or ramp for entrance   Can travel by private vehicle        Equipment Recommendations Other (comment) (TBD at next venue of care.)  Recommendations for Other Services       Functional Status Assessment Patient has had a recent decline in their functional status and demonstrates the ability to make significant improvements in function in a reasonable and predictable amount  of time.     Precautions / Restrictions Precautions Precautions: Fall Precaution Comments: Blood pressure was increased up to yesterday.  Causing therapy to hold off Restrictions Weight Bearing Restrictions: No      Mobility  Bed Mobility Overal bed mobility: Needs Assistance Bed Mobility: Supine to Sit     Supine to sit: Min assist          Transfers                   General transfer comment: Min assist x 2 sit to stand from bed.,  Toilet transfers relying mod assist on railing and min assist from OT -hold onto furniture to sink for hygiene-hand-held assist out of bathroom with PT    Ambulation/Gait Ambulation/Gait assistance: Contact guard assist Gait Distance (Feet): 160 Feet Assistive device: 1 person hand held assist Gait Pattern/deviations: WFL(Within Functional Limits) Gait velocity: decreased     General Gait Details: Pt able to ambulate with HHA but requires 3 standing rest breaks due to fatigue  Stairs            Wheelchair Mobility     Tilt Bed    Modified Rankin (Stroke Patients Only)       Balance Overall balance assessment: Needs assistance Sitting-balance support: Feet supported Sitting balance-Leahy Scale: Good     Standing balance support: Single extremity supported Standing balance-Leahy Scale: Fair  Pertinent Vitals/Pain      Home Living Family/patient expects to be discharged to:: Private residence Living Arrangements: Alone Available Help at Discharge: Family;Available PRN/intermittently Type of Home: House           Home Equipment: Agricultural consultant (2 wheels);Cane - single point Additional Comments: Pt reports her brother still lives with her, but is not there consistently.    Prior Function Prior Level of Function : Independent/Modified Independent             Mobility Comments: SPC use over the past year, but only uses intermittently. ADLs Comments: Pt  reports that she drives, but is likely going to stop.  Pt is blind in R eye.     Extremity/Trunk Assessment   Upper Extremity Assessment Upper Extremity Assessment: Overall WFL for tasks assessed    Lower Extremity Assessment Lower Extremity Assessment: Overall WFL for tasks assessed       Communication   Communication Communication: No apparent difficulties  Cognition Arousal: Alert Behavior During Therapy: WFL for tasks assessed/performed Overall Cognitive Status: Within Functional Limits for tasks assessed                                          General Comments      Exercises     Assessment/Plan    PT Assessment Patient needs continued PT services  PT Problem List Decreased strength;Decreased activity tolerance;Decreased balance;Decreased mobility;Decreased safety awareness       PT Treatment Interventions DME instruction;Gait training;Functional mobility training;Therapeutic activities;Therapeutic exercise;Balance training;Neuromuscular re-education    PT Goals (Current goals can be found in the Care Plan section)  Acute Rehab PT Goals Patient Stated Goal: to go to rehab facility to get better. PT Goal Formulation: With patient Time For Goal Achievement: 04/25/23 Potential to Achieve Goals: Good    Frequency Min 1X/week     Co-evaluation     PT goals addressed during session: Mobility/safety with mobility;Proper use of DME OT goals addressed during session: ADL's and self-care;Proper use of Adaptive equipment and DME       AM-PAC PT "6 Clicks" Mobility  Outcome Measure Help needed turning from your back to your side while in a flat bed without using bedrails?: A Little Help needed moving from lying on your back to sitting on the side of a flat bed without using bedrails?: A Little Help needed moving to and from a bed to a chair (including a wheelchair)?: A Little Help needed standing up from a chair using your arms (e.g., wheelchair  or bedside chair)?: A Little Help needed to walk in hospital room?: A Little Help needed climbing 3-5 steps with a railing? : A Lot 6 Click Score: 17    End of Session Equipment Utilized During Treatment: Gait belt Activity Tolerance: Patient tolerated treatment well Patient left: in chair;with call bell/phone within reach;with chair alarm set Nurse Communication: Mobility status PT Visit Diagnosis: Unsteadiness on feet (R26.81);Other abnormalities of gait and mobility (R26.89);Muscle weakness (generalized) (M62.81);Difficulty in walking, not elsewhere classified (R26.2)    Time: 2130-8657 PT Time Calculation (min) (ACUTE ONLY): 36 min   Charges:   PT Evaluation $PT Eval Low Complexity: 1 Low PT Treatments $Therapeutic Activity: 8-22 mins PT General Charges $$ ACUTE PT VISIT: 1 Visit         Nolon Bussing, PT, DPT Physical Therapist - [place]  [facility]  04/11/23, 3:06 PM

## 2023-04-12 DIAGNOSIS — E876 Hypokalemia: Secondary | ICD-10-CM | POA: Diagnosis not present

## 2023-04-12 LAB — BASIC METABOLIC PANEL
Anion gap: 8 (ref 5–15)
BUN: 9 mg/dL (ref 8–23)
CO2: 23 mmol/L (ref 22–32)
Calcium: 8.5 mg/dL — ABNORMAL LOW (ref 8.9–10.3)
Chloride: 106 mmol/L (ref 98–111)
Creatinine, Ser: 0.59 mg/dL (ref 0.44–1.00)
GFR, Estimated: >60 mL/min (ref >60)
Glucose, Bld: 105 mg/dL — ABNORMAL HIGH (ref 70–99)
Potassium: 4.8 mmol/L (ref 3.5–5.1)
Sodium: 137 mmol/L (ref 135–145)

## 2023-04-12 NOTE — Plan of Care (Signed)
  Problem: Clinical Measurements: Goal: Ability to maintain clinical measurements within normal limits will improve Outcome: Progressing Goal: Will remain free from infection Outcome: Progressing Goal: Diagnostic test results will improve Outcome: Progressing Goal: Respiratory complications will improve Outcome: Progressing Goal: Cardiovascular complication will be avoided Outcome: Progressing   Problem: Elimination: Goal: Will not experience complications related to bowel motility Outcome: Progressing Goal: Will not experience complications related to urinary retention Outcome: Progressing   Problem: Pain Management: Goal: General experience of comfort will improve Outcome: Progressing   Problem: Safety: Goal: Ability to remain free from injury will improve Outcome: Progressing   Problem: Skin Integrity: Goal: Risk for impaired skin integrity will decrease Outcome: Progressing

## 2023-04-12 NOTE — Progress Notes (Signed)
PROGRESS NOTE    Lisa [name]  UEA:[id number] DOB: 23-Jun-1945 DOA: 04/07/2023 PCP: Alba Cory, MD    Brief Narrative:  77 year old female admitted to the hospitalist service for management and evaluation of progressive nausea, weakness, poor p.o. tolerance, persistent loose stools. No patient is been seen in the past for similar circumstances. This is a largely nutritional/functional in nature.    Assessment & Plan:   Principal Problem:   Hypokalemia due to excessive gastrointestinal loss of potassium Active Problems:   Hypokalemia   Acute deep vein thrombosis (DVT) of left upper extremity (HCC)   Hypertensive urgency   Dyslipidemia   Type 2 diabetes mellitus without complications (HCC)   Chronic diastolic heart failure (HCC)   Malnutrition of moderate degree  Hypokalemia Hypomagnesemia Suspect these are secondary to malnutrition and chronic diarrhea.  Electrolytes being replaced aggressively. Plan: Lytes have improved No replacement indicated today Repeat labs in AM  Nonocclusive DVT in left upper extremity Superficial thrombophlebitis left upper extremity Started initially on intravenous heparin however given that the DVT is nonocclusive and upper extremity and superficial thrombophlebitis does not require anticoagulation will discontinue Plan: Elevate extremity Place warm compress No indication for therapeutic anticoagulation  Essential hypertension Poor control, refractory to therapies Improving control Plan: Amlodipine 10mg  qD Hydralazine 50mg  q8h Cozaar 100mg  daily Toprol xl 50mg  every day   Hyperlipidemia Statin  Type 2 diabetes mellitus without complications Okay for regular diet Continue Januvia  Chronic diastolic congestive heart failure No evidence of exacerbation Continue beta-blocker  Chronic diarrhea Poor p.o. intake Moderate malnutrition Functional decline Seen in consultation by registered dietitian.  Recommendations  appreciated. Low suspicion for infectious diarrhea Plan: As needed Imodium Daily psyllium Supplemental shakes Palliative care consult PT and OT consults TOC consult for SNF placement  Glaucoma Gtt regimen per ophtho    DVT prophylaxis: Lovenox Code Status: DNR Family Communication: None Disposition Plan: Status is: Inpatient Remains inpatient appropriate because: Multiple acute issues as above   Level of care: Med-Surg  Consultants:  Palliative care  Procedures:  None  Antimicrobials: None   Subjective: Seen and examined.  No acute events overnight.  Continues to endorse fatigue  Objective: Vitals:   04/11/23 1524 04/11/23 2053 04/12/23 0517 04/12/23 0746  BP: 137/65 133/85 (!) 170/83 (!) 168/71  Pulse: 70 71 99 63  Resp: 18 20 18 20   Temp: 98 F (36.7 C) 99.5 F (37.5 C) 98.7 F (37.1 C) (!) 97.5 F (36.4 C)  TempSrc:  Oral Oral   SpO2: 97% 98% 96% 100%  Height:       No intake or output data in the 24 hours ending 04/12/23 1102  There were no vitals filed for this visit.  Examination:  General exam: NAD.  Appears fatigued Respiratory system: Lungs clear, normal WOB, RA Cardiovascular system: S1-S2, RRR, no murmurs, no pedal edema Gastrointestinal system: Soft, NT/ND, normal bowel sounds Central nervous system: Alert and oriented. No focal neurological deficits. Extremities: Symmetric 5 x 5 power. Skin: No rashes, lesions or ulcers Psychiatry: Judgement and insight appear impaired. Mood & affect flattened.     Data Reviewed: I have personally reviewed following labs and imaging studies  CBC: Recent Labs  Lab 04/07/23 2154 04/08/23 0640 04/09/23 0551 04/10/23 0419  WBC 11.7* 8.8 7.6 5.9  NEUTROABS 10.2*  --   --  4.6  HGB 12.5 10.7* 10.5* 10.5*  HCT 38.3 32.1* 30.8* 31.9*  MCV 85.1 83.6 80.6 83.9  PLT 377 350 350 323   Basic Metabolic Panel:  Recent Labs  Lab 04/07/23 2201 04/08/23 0640 04/09/23 0551 04/10/23 0419  04/11/23 1425 04/12/23 0424  NA  --  136 139 141 135 137  K  --  2.2* 3.0* 3.7 5.7* 4.8  CL  --  95* 103 107 105 106  CO2  --  29 25 25 22 23   GLUCOSE  --  135* 111* 132* 129* 105*  BUN  --  7* 9 11 9 9   CREATININE  --  0.45 0.49 0.75 0.64 0.59  CALCIUM  --  6.8* 7.6* 8.5* 8.6* 8.5*  MG 1.4*  --  1.7  --  1.8  --    GFR: CrCl cannot be calculated (Unknown ideal weight.). Liver Function Tests: Recent Labs  Lab 04/07/23 2154  AST 23  ALT 17  ALKPHOS 97  BILITOT 1.0  PROT 6.8  ALBUMIN 2.7*   Recent Labs  Lab 04/07/23 2154  LIPASE 21   No results for input(s): "AMMONIA" in the last 168 hours. Coagulation Profile: Recent Labs  Lab 04/08/23 0114  INR 1.2   Cardiac Enzymes: No results for input(s): "CKTOTAL", "CKMB", "CKMBINDEX", "TROPONINI" in the last 168 hours. BNP (last 3 results) No results for input(s): "PROBNP" in the last 8760 hours. HbA1C: No results for input(s): "HGBA1C" in the last 72 hours. CBG: No results for input(s): "GLUCAP" in the last 168 hours. Lipid Profile: No results for input(s): "CHOL", "HDL", "LDLCALC", "TRIG", "CHOLHDL", "LDLDIRECT" in the last 72 hours. Thyroid Function Tests: No results for input(s): "TSH", "T4TOTAL", "FREET4", "T3FREE", "THYROIDAB" in the last 72 hours. Anemia Panel: No results for input(s): "VITAMINB12", "FOLATE", "FERRITIN", "TIBC", "IRON", "RETICCTPCT" in the last 72 hours.  Sepsis Labs: No results for input(s): "PROCALCITON", "LATICACIDVEN" in the last 168 hours.  No results found for this or any previous visit (from the past 240 hour(s)).       Radiology Studies: No results found.      Scheduled Meds:  amLODipine  10 mg Oral Daily   aspirin EC  81 mg Oral Daily   atropine  1 drop Left Eye TID   brimonidine  1 drop Both Eyes BID   citalopram  10 mg Oral Daily   clopidogrel  75 mg Oral Daily   cyanocobalamin  1,000 mcg Oral Daily   dapagliflozin propanediol  10 mg Oral Daily   dorzolamide-timolol   1 drop Both Eyes BID   feeding supplement  237 mL Oral BID BM   hydrALAZINE  50 mg Oral Q8H   linagliptin  5 mg Oral Daily   losartan  100 mg Oral Daily   metoprolol succinate  25 mg Oral Daily   mirabegron ER  25 mg Oral Daily   multivitamin with minerals  1 tablet Oral Daily   prednisoLONE acetate  1 drop Left Eye QID   psyllium  1 packet Oral Daily   rosuvastatin  10 mg Oral Daily   Vitamin D (Ergocalciferol)  50,000 Units Oral Q7 days   Continuous Infusions:     LOS: 4 days   Tresa Moore, MD [facility]   If 7PM-7AM, please contact night-coverage  04/12/2023, 11:02 AM

## 2023-04-12 NOTE — TOC Progression Note (Signed)
Transition of Care [facility]) - Progression Note    Patient Details  Name: Lisa [name] MRN: [id number] Date of Birth: July 21, 1945  [facility]) CM/SW Contact  Ashley Royalty Lutricia Feil, RN Phone Number: 04/12/2023, 4:32 PM  Clinical Narrative:     Spoke with pt concerning choice for SNF. Pt unable to make a decision as TOC RN provided a MCR.GOV list of SNF facilities for choice. Pt states she will make a decision by tomorrow on the facility she does not wish to pursue. Pt inquired on on possibly considering [facility] but again will decide by tomorrow.   Pt states her brother lives with her but she cares for herself with transportation and providers appointment. Pt reports she has a walker, cane, WC but will need a 3-1 if she decides to go home with [facility].   PASRR# pending and team has been updated. TOC will follow up tomorrow.  Expected Discharge Plan: Skilled Nursing Facility Barriers to Discharge: Continued Medical Work up  Expected Discharge Plan and Services   Discharge Planning Services: CM Consult Post Acute Care Choice: Skilled Nursing Facility Living arrangements for the past 2 months: Single Family Home                                       Social Determinants of Health (SDOH) Interventions SDOH Screenings   Food Insecurity: No Food Insecurity (04/08/2023)  Housing: Low Risk  (04/08/2023)  Transportation Needs: No Transportation Needs (04/08/2023)  Utilities: Not At Risk (04/08/2023)  Alcohol Screen: Low Risk  (02/23/2023)  Depression (PHQ2-9): Low Risk  (02/23/2023)  Financial Resource Strain: Medium Risk (04/08/2023)  Physical Activity: Insufficiently Active (03/26/2021)  Social Connections: Socially Isolated (03/19/2022)  Stress: No Stress Concern Present (03/26/2021)  Tobacco Use: High Risk (04/08/2023)    Readmission Risk Interventions     No data to display

## 2023-04-13 DIAGNOSIS — E876 Hypokalemia: Secondary | ICD-10-CM | POA: Diagnosis not present

## 2023-04-13 LAB — MISC LABCORP TEST (SEND OUT): Labcorp test code: 70115

## 2023-04-13 NOTE — TOC PASRR Note (Signed)
RE: Lisa [name] Date of Birth: 1946-01-28 Date: 04/13/2023     To Whom It May Concern:   Please be advised that the above-named patient will require a short-term nursing home stay - anticipated 30 days or less for rehabilitation and strengthening.  The plan is for return home

## 2023-04-13 NOTE — [place] FL2 (Signed)
[place] MEDICAID FL2 LEVEL OF CARE FORM     IDENTIFICATION  Patient Name: Lisa [name] Birthdate: 04/24/1946 Sex: female Admission Date (Current Location): 04/07/2023  [facility] and [place] Number:  Chiropodist and Address:  [facility], [address] [place], [place] [address]      Provider Number: [id number]  Attending Physician Name and Address:  Tresa Moore, MD  Relative Name and Phone Number:  [facility])  [phone]    Current Level of Care: Hospital Recommended Level of Care: Skilled Nursing Facility Prior Approval Number:    Date Approved/Denied:   PASRR Number: Pending  Discharge Plan: SNF    Current Diagnoses: Patient Active Problem List   Diagnosis Date Noted   Hypokalemia 04/08/2023   Acute deep vein thrombosis (DVT) of left upper extremity (HCC) 04/08/2023   Hypertensive urgency 04/08/2023   Type 2 diabetes mellitus without complications (HCC) 04/08/2023   Hypokalemia due to excessive gastrointestinal loss of potassium 04/08/2023   Malnutrition of moderate degree 04/08/2023   Unilateral amputation of left foot (HCC) 02/19/2022   Hypertension associated with type 2 diabetes mellitus (HCC) 02/19/2022   Chronic diastolic heart failure (HCC) 04/09/2021   Nicotine dependence 04/09/2021   Syncope and collapse 02/26/2021   Prolonged QT interval 02/26/2021   DM (diabetes mellitus), type 2 with peripheral vascular complications (HCC) 09/25/2015   Amputation of left foot (HCC) 12/02/2014   Benign essential HTN 12/02/2014   Depression, major, recurrent, mild (HCC) 12/02/2014   Dyslipidemia 12/02/2014   Vitamin B12 deficiency 12/02/2014   Peripheral neuropathic pain 12/02/2014   Peripheral artery disease (HCC) 12/02/2014   Phantom limb (HCC) 12/02/2014   Diabetes mellitus with nephropathy (HCC) 12/02/2014   Detached retina 12/02/2014   Type 2 diabetes mellitus with peripheral neuropathy (HCC)  12/02/2014   Tobacco use 12/02/2014   Urge incontinence 12/02/2014   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   Vitamin D deficiency 12/02/2014    Orientation RESPIRATION BLADDER Height & Weight     Self, Time, Situation, Place  Normal Incontinent Weight:   Height:  5\' 4"  (162.6 cm)  BEHAVIORAL SYMPTOMS/MOOD NEUROLOGICAL BOWEL NUTRITION STATUS      Continent    AMBULATORY STATUS COMMUNICATION OF NEEDS Skin   Limited Assist Verbally Normal                       Personal Care Assistance Level of Assistance  Feeding, Bathing, Dressing Bathing Assistance: Limited assistance Feeding assistance: Limited assistance Dressing Assistance: Limited assistance     Functional Limitations Info  Sight, Hearing, Speech Sight Info: Adequate Hearing Info: Adequate Speech Info: Adequate    SPECIAL CARE FACTORS FREQUENCY  PT (By licensed PT), OT (By licensed OT)     PT Frequency: 5 times a wek OT Frequency: 5 times a week            Contractures Contractures Info: Not present    Additional Factors Info  Code Status, Allergies Code Status Info: DNR Allergies Info: Other  Tapentadol Hcl  Actos (Pioglitazone)  Penicillins           Current Medications (04/13/2023):  This is the current hospital active medication list Current Facility-Administered Medications  Medication Dose Route Frequency Provider Last Rate Last Admin   acetaminophen (TYLENOL) tablet 650 mg  650 mg Oral Q6H PRN Mansy, Jan A, MD       Or   acetaminophen (TYLENOL) suppository 650 mg  650 mg Rectal Q6H PRN Mansy,  Jan A, MD       amLODipine (NORVASC) tablet 10 mg  10 mg Oral Daily Lolita Patella B, MD   10 mg at 04/13/23 0920   aspirin EC tablet 81 mg  81 mg Oral Daily Mansy, Jan A, MD   81 mg at 04/13/23 0920   atropine 1 % ophthalmic solution 1 drop  1 drop Left Eye TID Angelique Blonder, RPH   1 drop at 04/13/23 1004   brimonidine (ALPHAGAN) 0.2 % ophthalmic solution 1 drop  1 drop Both Eyes BID Mansy, Jan A, MD   1  drop at 04/13/23 0923   citalopram (CELEXA) tablet 10 mg  10 mg Oral Daily Mansy, Jan A, MD   10 mg at 04/13/23 0920   clopidogrel (PLAVIX) tablet 75 mg  75 mg Oral Daily Mansy, Jan A, MD   75 mg at 04/13/23 1610   cyanocobalamin (VITAMIN B12) tablet 1,000 mcg  1,000 mcg Oral Daily Mansy, Jan A, MD   1,000 mcg at 04/13/23 0920   dapagliflozin propanediol (FARXIGA) tablet 10 mg  10 mg Oral Daily Mansy, Jan A, MD   10 mg at 04/13/23 0921   dorzolamide-timolol (COSOPT) 2-0.5 % ophthalmic solution 1 drop  1 drop Both Eyes BID Mansy, Jan A, MD   1 drop at 04/13/23 1019   feeding supplement (ENSURE ENLIVE / ENSURE PLUS) liquid 237 mL  237 mL Oral BID BM Sreenath, Sudheer B, MD   237 mL at 04/13/23 1020   hydrALAZINE (APRESOLINE) injection 10 mg  10 mg Intravenous Q4H PRN Sreenath, Sudheer B, MD       hydrALAZINE (APRESOLINE) tablet 50 mg  50 mg Oral Q8H Sreenath, Sudheer B, MD   50 mg at 04/13/23 0504   labetalol (NORMODYNE) injection 10 mg  10 mg Intravenous Q4H PRN Lolita Patella B, MD   10 mg at 04/11/23 0044   linagliptin (TRADJENTA) tablet 5 mg  5 mg Oral Daily Mansy, Jan A, MD   5 mg at 04/13/23 0920   loperamide (IMODIUM) capsule 2 mg  2 mg Oral PRN Lolita Patella B, MD       losartan (COZAAR) tablet 100 mg  100 mg Oral Daily Sreenath, Sudheer B, MD   100 mg at 04/13/23 0921   metoCLOPramide (REGLAN) injection 5 mg  5 mg Intravenous Q6H PRN Mansy, Jan A, MD       metoprolol succinate (TOPROL-XL) 24 hr tablet 25 mg  25 mg Oral Daily Georgeann Oppenheim, Sudheer B, MD   25 mg at 04/13/23 0920   mirabegron ER (MYRBETRIQ) tablet 25 mg  25 mg Oral Daily Mansy, Jan A, MD   25 mg at 04/13/23 0920   multivitamin with minerals tablet 1 tablet  1 tablet Oral Daily Mansy, Jan A, MD   1 tablet at 04/13/23 9604   nitroGLYCERIN (NITROSTAT) SL tablet 0.4 mg  0.4 mg Sublingual Q5 min PRN Mansy, Jan A, MD       polyvinyl alcohol (LIQUIFILM TEARS) 1.4 % ophthalmic solution 2 drop  2 drop Right Eye PRN Sreenath, Sudheer  B, MD       prednisoLONE acetate (PRED FORTE) 1 % ophthalmic suspension 1 drop  1 drop Left Eye QID Bari Mantis A, RPH   1 drop at 04/13/23 0947   psyllium (HYDROCIL/METAMUCIL) 1 packet  1 packet Oral Daily Lolita Patella B, MD   1 packet at 04/13/23 0949   rosuvastatin (CRESTOR) tablet 10 mg  10 mg Oral Daily Mansy,  Jan A, MD   10 mg at 04/13/23 0920   traZODone (DESYREL) tablet 25 mg  25 mg Oral QHS PRN Mansy, Jan A, MD       Vitamin D (Ergocalciferol) (DRISDOL) 1.25 MG (50000 UNIT) capsule 50,000 Units  50,000 Units Oral Q7 days Mansy, Jan A, MD   50,000 Units at 04/09/23 1202     Discharge Medications: Please see discharge summary for a list of discharge medications.  Relevant Imaging Results:  Relevant Lab Results:   Additional Information SS [id number]  Allena Katz, LCSW

## 2023-04-13 NOTE — Progress Notes (Signed)
PROGRESS NOTE    Lisa [name]  OAC:[id number] DOB: 1945-12-09 DOA: 04/07/2023 PCP: Alba Cory, MD    Brief Narrative:  77 year old female admitted to the hospitalist service for management and evaluation of progressive nausea, weakness, poor p.o. tolerance, persistent loose stools. No patient is been seen in the past for similar circumstances. This is a largely nutritional/functional in nature.    Assessment & Plan:   Principal Problem:   Hypokalemia due to excessive gastrointestinal loss of potassium Active Problems:   Hypokalemia   Acute deep vein thrombosis (DVT) of left upper extremity (HCC)   Hypertensive urgency   Dyslipidemia   Type 2 diabetes mellitus without complications (HCC)   Chronic diastolic heart failure (HCC)   Malnutrition of moderate degree  Hypokalemia Hypomagnesemia Suspect these are secondary to malnutrition and chronic diarrhea.  Electrolytes being replaced aggressively. Plan: Lytes have improved Check labs periodically  Nonocclusive DVT in left upper extremity Superficial thrombophlebitis left upper extremity Started initially on intravenous heparin however given that the DVT is nonocclusive and upper extremity and superficial thrombophlebitis does not require anticoagulation will discontinue Plan: Elevate extremity Place warm compress No indication for therapeutic anticoagulation  Essential hypertension Poor control, refractory to therapies Improving control Plan: Amlodipine 10mg  qD Hydralazine 50mg  q8h Cozaar 100mg  daily Toprol xl 50mg  daily   Hyperlipidemia Statin  Type 2 diabetes mellitus without complications Okay for regular diet Continue Januvia  Chronic diastolic congestive heart failure No evidence of exacerbation Continue beta-blocker  Chronic diarrhea Poor p.o. intake Moderate malnutrition Functional decline Seen in consultation by registered dietitian.  Recommendations appreciated. Low suspicion for  infectious diarrhea Plan: As needed Imodium Daily psyllium Supplemental shakes TID Palliative care consult, DNR PT and OT consults TOC consult for SNF placement  Glaucoma Gtt regimen per ophtho    DVT prophylaxis: Lovenox Code Status: DNR Family Communication: None Disposition Plan: Status is: Inpatient Remains inpatient appropriate because: Multiple acute issues as above   Level of care: Med-Surg  Consultants:  Palliative care  Procedures:  None  Antimicrobials: None   Subjective: Seen and examined.  No acute events overnight.  States she is trying to eat more  Objective: Vitals:   04/12/23 2126 04/12/23 2130 04/13/23 0446 04/13/23 0852  BP: (!) 164/75 (!) 164/75 (!) 161/78 (!) 149/82  Pulse: 72  63 63  Resp: 16  17 19   Temp: 98.6 F (37 C)  98.1 F (36.7 C) 98.1 F (36.7 C)  TempSrc: Oral  Oral   SpO2: 97%  99% 100%  Height:        Intake/Output Summary (Last 24 hours) at 04/13/2023 1256 Last data filed at 04/13/2023 1047 Gross per 24 hour  Intake 180 ml  Output --  Net 180 ml    There were no vitals filed for this visit.  Examination:  General exam: NAD.  Fatigued Respiratory system: Lungs clear, normal WOB, RA Cardiovascular system: S1-S2, RRR, no murmurs, no pedal edema Gastrointestinal system: Soft, NT/ND, normal bowel sounds Central nervous system: Alert and oriented. No focal neurological deficits. Extremities: Symmetric 3x5 power.  Gait not assessed Skin: No rashes, lesions or ulcers Psychiatry: Judgement and insight appear impaired. Mood & affect flattened.     Data Reviewed: I have personally reviewed following labs and imaging studies  CBC: Recent Labs  Lab 04/07/23 2154 04/08/23 0640 04/09/23 0551 04/10/23 0419  WBC 11.7* 8.8 7.6 5.9  NEUTROABS 10.2*  --   --  4.6  HGB 12.5 10.7* 10.5* 10.5*  HCT 38.3 32.1* 30.8*  31.9*  MCV 85.1 83.6 80.6 83.9  PLT 377 350 350 323   Basic Metabolic Panel: Recent Labs  Lab  04/07/23 2201 04/08/23 0640 04/09/23 0551 04/10/23 0419 04/11/23 1425 04/12/23 0424  NA  --  136 139 141 135 137  K  --  2.2* 3.0* 3.7 5.7* 4.8  CL  --  95* 103 107 105 106  CO2  --  29 25 25 22 23   GLUCOSE  --  135* 111* 132* 129* 105*  BUN  --  7* 9 11 9 9   CREATININE  --  0.45 0.49 0.75 0.64 0.59  CALCIUM  --  6.8* 7.6* 8.5* 8.6* 8.5*  MG 1.4*  --  1.7  --  1.8  --    GFR: CrCl cannot be calculated (Unknown ideal weight.). Liver Function Tests: Recent Labs  Lab 04/07/23 2154  AST 23  ALT 17  ALKPHOS 97  BILITOT 1.0  PROT 6.8  ALBUMIN 2.7*   Recent Labs  Lab 04/07/23 2154  LIPASE 21   No results for input(s): "AMMONIA" in the last 168 hours. Coagulation Profile: Recent Labs  Lab 04/08/23 0114  INR 1.2   Cardiac Enzymes: No results for input(s): "CKTOTAL", "CKMB", "CKMBINDEX", "TROPONINI" in the last 168 hours. BNP (last 3 results) No results for input(s): "PROBNP" in the last 8760 hours. HbA1C: No results for input(s): "HGBA1C" in the last 72 hours. CBG: No results for input(s): "GLUCAP" in the last 168 hours. Lipid Profile: No results for input(s): "CHOL", "HDL", "LDLCALC", "TRIG", "CHOLHDL", "LDLDIRECT" in the last 72 hours. Thyroid Function Tests: No results for input(s): "TSH", "T4TOTAL", "FREET4", "T3FREE", "THYROIDAB" in the last 72 hours. Anemia Panel: No results for input(s): "VITAMINB12", "FOLATE", "FERRITIN", "TIBC", "IRON", "RETICCTPCT" in the last 72 hours.  Sepsis Labs: No results for input(s): "PROCALCITON", "LATICACIDVEN" in the last 168 hours.  No results found for this or any previous visit (from the past 240 hour(s)).       Radiology Studies: No results found.      Scheduled Meds:  amLODipine  10 mg Oral Daily   aspirin EC  81 mg Oral Daily   atropine  1 drop Left Eye TID   brimonidine  1 drop Both Eyes BID   citalopram  10 mg Oral Daily   clopidogrel  75 mg Oral Daily   cyanocobalamin  1,000 mcg Oral Daily    dapagliflozin propanediol  10 mg Oral Daily   dorzolamide-timolol  1 drop Both Eyes BID   feeding supplement  237 mL Oral BID BM   hydrALAZINE  50 mg Oral Q8H   linagliptin  5 mg Oral Daily   losartan  100 mg Oral Daily   metoprolol succinate  25 mg Oral Daily   mirabegron ER  25 mg Oral Daily   multivitamin with minerals  1 tablet Oral Daily   prednisoLONE acetate  1 drop Left Eye QID   psyllium  1 packet Oral Daily   rosuvastatin  10 mg Oral Daily   Vitamin D (Ergocalciferol)  50,000 Units Oral Q7 days   Continuous Infusions:     LOS: 5 days   Tresa Moore, MD [facility]   If 7PM-7AM, please contact night-coverage  04/13/2023, 12:56 PM

## 2023-04-13 NOTE — Plan of Care (Signed)
  Problem: Education: Goal: Knowledge of General Education information will improve Description Including pain rating scale, medication(s)/side effects and non-pharmacologic comfort measures Outcome: Progressing   

## 2023-04-14 ENCOUNTER — Encounter: Payer: Medicare Other | Admitting: Family

## 2023-04-14 DIAGNOSIS — N3281 Overactive bladder: Secondary | ICD-10-CM | POA: Diagnosis not present

## 2023-04-14 DIAGNOSIS — K529 Noninfective gastroenteritis and colitis, unspecified: Secondary | ICD-10-CM | POA: Diagnosis not present

## 2023-04-14 DIAGNOSIS — W19XXXD Unspecified fall, subsequent encounter: Secondary | ICD-10-CM | POA: Diagnosis not present

## 2023-04-14 DIAGNOSIS — I110 Hypertensive heart disease with heart failure: Secondary | ICD-10-CM | POA: Diagnosis not present

## 2023-04-14 DIAGNOSIS — Z79899 Other long term (current) drug therapy: Secondary | ICD-10-CM | POA: Diagnosis not present

## 2023-04-14 DIAGNOSIS — F17211 Nicotine dependence, cigarettes, in remission: Secondary | ICD-10-CM | POA: Diagnosis not present

## 2023-04-14 DIAGNOSIS — Z7401 Bed confinement status: Secondary | ICD-10-CM | POA: Diagnosis not present

## 2023-04-14 DIAGNOSIS — Z89422 Acquired absence of other left toe(s): Secondary | ICD-10-CM | POA: Diagnosis not present

## 2023-04-14 DIAGNOSIS — R110 Nausea: Secondary | ICD-10-CM | POA: Diagnosis not present

## 2023-04-14 DIAGNOSIS — H54415A Blindness right eye category 5, normal vision left eye: Secondary | ICD-10-CM | POA: Diagnosis not present

## 2023-04-14 DIAGNOSIS — Z7984 Long term (current) use of oral hypoglycemic drugs: Secondary | ICD-10-CM | POA: Diagnosis not present

## 2023-04-14 DIAGNOSIS — R079 Chest pain, unspecified: Secondary | ICD-10-CM | POA: Diagnosis not present

## 2023-04-14 DIAGNOSIS — Z7982 Long term (current) use of aspirin: Secondary | ICD-10-CM | POA: Diagnosis not present

## 2023-04-14 DIAGNOSIS — E782 Mixed hyperlipidemia: Secondary | ICD-10-CM | POA: Diagnosis not present

## 2023-04-14 DIAGNOSIS — I82622 Acute embolism and thrombosis of deep veins of left upper extremity: Secondary | ICD-10-CM | POA: Diagnosis not present

## 2023-04-14 DIAGNOSIS — I5032 Chronic diastolic (congestive) heart failure: Secondary | ICD-10-CM | POA: Diagnosis not present

## 2023-04-14 DIAGNOSIS — E1151 Type 2 diabetes mellitus with diabetic peripheral angiopathy without gangrene: Secondary | ICD-10-CM | POA: Diagnosis not present

## 2023-04-14 DIAGNOSIS — E440 Moderate protein-calorie malnutrition: Secondary | ICD-10-CM | POA: Diagnosis not present

## 2023-04-14 DIAGNOSIS — Z7902 Long term (current) use of antithrombotics/antiplatelets: Secondary | ICD-10-CM | POA: Diagnosis not present

## 2023-04-14 DIAGNOSIS — E785 Hyperlipidemia, unspecified: Secondary | ICD-10-CM | POA: Diagnosis not present

## 2023-04-14 DIAGNOSIS — R197 Diarrhea, unspecified: Secondary | ICD-10-CM | POA: Diagnosis not present

## 2023-04-14 DIAGNOSIS — Z86718 Personal history of other venous thrombosis and embolism: Secondary | ICD-10-CM | POA: Diagnosis not present

## 2023-04-14 DIAGNOSIS — E876 Hypokalemia: Secondary | ICD-10-CM | POA: Diagnosis not present

## 2023-04-14 DIAGNOSIS — R6889 Other general symptoms and signs: Secondary | ICD-10-CM | POA: Diagnosis not present

## 2023-04-14 DIAGNOSIS — E559 Vitamin D deficiency, unspecified: Secondary | ICD-10-CM | POA: Diagnosis not present

## 2023-04-14 DIAGNOSIS — I5020 Unspecified systolic (congestive) heart failure: Secondary | ICD-10-CM | POA: Diagnosis not present

## 2023-04-14 DIAGNOSIS — I428 Other cardiomyopathies: Secondary | ICD-10-CM | POA: Diagnosis not present

## 2023-04-14 DIAGNOSIS — E119 Type 2 diabetes mellitus without complications: Secondary | ICD-10-CM | POA: Diagnosis not present

## 2023-04-14 DIAGNOSIS — R0782 Intercostal pain: Secondary | ICD-10-CM | POA: Diagnosis not present

## 2023-04-14 DIAGNOSIS — E1169 Type 2 diabetes mellitus with other specified complication: Secondary | ICD-10-CM | POA: Diagnosis not present

## 2023-04-14 DIAGNOSIS — Z833 Family history of diabetes mellitus: Secondary | ICD-10-CM | POA: Diagnosis not present

## 2023-04-14 DIAGNOSIS — F32A Depression, unspecified: Secondary | ICD-10-CM | POA: Diagnosis not present

## 2023-04-14 DIAGNOSIS — E1140 Type 2 diabetes mellitus with diabetic neuropathy, unspecified: Secondary | ICD-10-CM | POA: Diagnosis not present

## 2023-04-14 DIAGNOSIS — I10 Essential (primary) hypertension: Secondary | ICD-10-CM | POA: Diagnosis not present

## 2023-04-14 DIAGNOSIS — I82722 Chronic embolism and thrombosis of deep veins of left upper extremity: Secondary | ICD-10-CM | POA: Diagnosis not present

## 2023-04-14 DIAGNOSIS — H409 Unspecified glaucoma: Secondary | ICD-10-CM | POA: Diagnosis not present

## 2023-04-14 DIAGNOSIS — I808 Phlebitis and thrombophlebitis of other sites: Secondary | ICD-10-CM

## 2023-04-14 DIAGNOSIS — W010XXA Fall on same level from slipping, tripping and stumbling without subsequent striking against object, initial encounter: Secondary | ICD-10-CM | POA: Diagnosis not present

## 2023-04-14 MED ORDER — LOSARTAN POTASSIUM 100 MG PO TABS: 100.0000 mg | ORAL_TABLET | Freq: Every day | ORAL | Status: DC

## 2023-04-14 MED ORDER — PSYLLIUM 95 % PO PACK: 1.0000 | PACK | Freq: Every day | ORAL | Status: DC

## 2023-04-14 MED ORDER — AMLODIPINE BESYLATE 10 MG PO TABS: 10.0000 mg | ORAL_TABLET | Freq: Every day | ORAL | Status: DC

## 2023-04-14 MED ORDER — HYDRALAZINE HCL 50 MG PO TABS: 50.0000 mg | ORAL_TABLET | Freq: Three times a day (TID) | ORAL | Status: DC

## 2023-04-14 MED ORDER — POLYVINYL ALCOHOL 1.4 % OP SOLN: 2.0000 [drp] | OPHTHALMIC | Status: AC | PRN

## 2023-04-14 MED ORDER — ENSURE ENLIVE PO LIQD: 237.0000 mL | Freq: Two times a day (BID) | ORAL | Status: AC

## 2023-04-14 NOTE — TOC Transition Note (Addendum)
Transition of Care [facility]) - CM/SW Discharge Note   Patient Details  Name: Lisa [name] MRN: [id number] Date of Birth: [birth year]  [facility]) CM/SW Contact:  Allena Katz, LCSW Phone Number: 04/14/2023, 10:02 AM   Clinical Narrative: Pt has chosen to discharge to [facility] today. RN given number for report. Medical necessity printed and on chart. DC summary to be sent once in.   DNR signed and on the chart.   PASSR [id number] E    Final next level of care: Skilled Nursing Facility Barriers to Discharge: Barriers Resolved   Patient Goals and CMS Choice CMS Medicare.gov Compare Post Acute Care list provided to:: Patient Choice offered to / list presented to : Patient  Discharge Placement PASRR number recieved: 04/14/23 PASRR number recieved: 04/14/23            Patient chooses bed at: [facility] Patient to be transferred to facility by: [facility]   Patient and family notified of of transfer: 04/14/23  Discharge Plan and Services Additional resources added to the After Visit Summary for     Discharge Planning Services: CM Consult Post Acute Care Choice: Skilled Nursing Facility                               Social Determinants of Health (SDOH) Interventions SDOH Screenings   Food Insecurity: No Food Insecurity (04/08/2023)  Housing: Low Risk  (04/08/2023)  Transportation Needs: No Transportation Needs (04/08/2023)  Utilities: Not At Risk (04/08/2023)  Alcohol Screen: Low Risk  (02/23/2023)  Depression (PHQ2-9): Low Risk  (02/23/2023)  Financial Resource Strain: Medium Risk (04/08/2023)  Physical Activity: Insufficiently Active (03/26/2021)  Social Connections: Socially Isolated (03/19/2022)  Stress: No Stress Concern Present (03/26/2021)  Tobacco Use: High Risk (04/08/2023)     Readmission Risk Interventions     No data to display

## 2023-04-14 NOTE — Discharge Summary (Signed)
Physician Discharge Summary  Lisa [name] HKV:[id number] DOB: [birth year] DOA: 04/07/2023  PCP: Alba Cory, MD  Admit date: 04/07/2023 Discharge date: 04/14/2023  Admitted From: Home Disposition:  SNF  Recommendations for Outpatient Follow-up:  Follow up with PCP in 1-2 weeks   Home Health:No Equipment/Devices:None   Discharge Condition:Stable  CODE STATUS:DNR  Diet recommendation: Reg  Brief/Interim Summary:  77 year old female admitted to the hospitalist service for management and evaluation of progressive nausea, weakness, poor p.o. tolerance, persistent loose stools. No patient is been seen in the past for similar circumstances. This is a largely nutritional/functional in nature.     Discharge Diagnoses:  Principal Problem:   Hypokalemia due to excessive gastrointestinal loss of potassium Active Problems:   Hypokalemia   Acute deep vein thrombosis (DVT) of left upper extremity (HCC)   Hypertensive urgency   Dyslipidemia   Type 2 diabetes mellitus without complications (HCC)   Chronic diastolic heart failure (HCC)   Malnutrition of moderate degree Hypokalemia Hypomagnesemia Suspect these are secondary to malnutrition and chronic diarrhea.  Electrolytes being replaced aggressively. Plan: Lytes have improved Check labs periodically post discharge Encourage PO intake   Nonocclusive DVT in left upper extremity Superficial thrombophlebitis left upper extremity Started initially on intravenous heparin however given that the DVT is nonocclusive and upper extremity and superficial thrombophlebitis does not require anticoagulation will discontinue Plan: Elevate extremity Place warm compress No indication for therapeutic anticoagulation Outpatient surveillance   Essential hypertension Poor control, refractory to therapies Improving control Plan: Amlodipine 10mg  qD Hydralazine 50mg  q8h Cozaar 100mg  daily Toprol xl 50mg  daily     Hyperlipidemia Statin    Type 2 diabetes mellitus without complications Okay for regular diet Continue Januvia   Chronic diastolic congestive heart failure No evidence of exacerbation Continue beta-blocker   Chronic diarrhea Poor p.o. intake Moderate malnutrition Functional decline Seen in consultation by registered dietitian.  Recommendations appreciated. Low suspicion for infectious diarrhea Plan: As needed Imodium Daily psyllium Supplemental shakes TID Palliative care consult, DNR PT and OT consults [facility] consult for SNF placement Accepted to [facility]   Glaucoma Gtt regimen per ophtho   Discharge Instructions  Discharge Instructions     Diet - low sodium heart healthy   Complete by: As directed    Increase activity slowly   Complete by: As directed       Allergies as of 04/14/2023       Reactions   Other Itching   Nucinta - Hallucinations   Tapentadol Hcl Itching   Hallucinations   Actos [pioglitazone]    History of CHF   Penicillins Rash        Medication List     STOP taking these medications    Metamucil 0.52 g capsule Generic drug: psyllium   moxifloxacin 0.5 % ophthalmic solution Commonly known as: VIGAMOX   potassium chloride SA 20 MEQ tablet Commonly known as: Klor-Con M20       TAKE these medications    amLODipine 10 MG tablet Commonly known as: NORVASC Take 1 tablet (10 mg total) by mouth daily. Start taking on: April 15, 2023   aspirin 81 MG tablet Take 81 mg by mouth daily.   atropine 1 % ophthalmic solution Place 1 drop into the left eye 3 (three) times daily.   B-12 1000 MCG Subl Place 1 tablet under the tongue daily.   brimonidine 0.2 % ophthalmic solution Commonly known as: ALPHAGAN Place 1 drop into both eyes 2 (two) times daily.   citalopram 10 MG  tablet Commonly known as: CELEXA Take 1 tablet (10 mg total) by mouth daily.   clopidogrel 75 MG tablet Commonly known as: PLAVIX Take 1 tablet (75 mg total) by mouth  daily.   dapagliflozin propanediol 10 MG Tabs tablet Commonly known as: Farxiga TAKE 1 TABLET BY MOUTH EVERY DAY BEFORE BREAKFAST   dorzolamide-timolol 2-0.5 % ophthalmic solution Commonly known as: COSOPT Place 1 drop into both eyes 2 (two) times daily.   Gemtesa 75 MG Tabs Generic drug: Vibegron Take 75 mg by mouth daily.   hydrALAZINE 50 MG tablet Commonly known as: APRESOLINE Take 1 tablet (50 mg total) by mouth every 8 (eight) hours.   losartan 100 MG tablet Commonly known as: COZAAR Take 1 tablet (100 mg total) by mouth daily. Start taking on: April 15, 2023 What changed:  medication strength how much to take   metoprolol succinate 25 MG 24 hr tablet Commonly known as: Toprol XL Take 1 tablet (25 mg total) by mouth daily. Take with or immediately following a meal.   multivitamin tablet Take 1 tablet by mouth daily.   nitroGLYCERIN 0.4 MG SL tablet Commonly known as: NITROSTAT Place 1 tablet (0.4 mg total) under the tongue every 5 (five) minutes as needed for chest pain.   polyvinyl alcohol 1.4 % ophthalmic solution Commonly known as: LIQUIFILM TEARS Place 2 drops into the right eye as needed for dry eyes.   prednisoLONE acetate 1 % ophthalmic suspension Commonly known as: PRED FORTE Place 1 drop into the left eye 4 (four) times daily.   rosuvastatin 10 MG tablet Commonly known as: CRESTOR Take 1 tablet (10 mg total) by mouth daily.   sitaGLIPtin 100 MG tablet Commonly known as: Januvia Take 1 tablet (100 mg total) by mouth daily.   Vitamin D (Ergocalciferol) 1.25 MG (50000 UNIT) Caps capsule Commonly known as: DRISDOL TAKE 1 CAPSULE (50,000 UNITS TOTAL) BY MOUTH EVERY 7 (SEVEN) DAYS        Contact information for follow-up providers     [facility]. Go in 6 day(s).   Specialty: Cardiology Why: Hospital Follow-Up 04/20/23 @ 11:00 am [facility], [address] Please bring all medications to  appointment Contact information: [address] [place] [address] [phone]         EAR, NOSE AND THROAT .   Contact information: [address] [place] [address] [phone]        Alba Cory, MD Follow up.   Specialty: Family Medicine Why: Hospital follow up. Contact information: [address] [place] [address] [phone]              Contact information for after-discharge care     Destination     [facility] Preferred [facility] .   Service: Skilled Nursing Contact information: [address] [place] [address] [phone]                    Allergies  Allergen Reactions   Other Itching    Nucinta - Hallucinations   Tapentadol Hcl Itching    Hallucinations   Actos [Pioglitazone]     History of CHF   Penicillins Rash    Consultations: None   Procedures/Studies: CT Angio Chest PE W and/or Wo Contrast  Result Date: 04/08/2023 CLINICAL DATA:  Shortness of breath. History of clot in the jugular. EXAM: CT  ANGIOGRAPHY CHEST WITH CONTRAST TECHNIQUE: Multidetector CT imaging of the chest was performed using the standard protocol during bolus administration of intravenous contrast. Multiplanar CT image reconstructions and MIPs were obtained to evaluate the vascular anatomy. RADIATION DOSE REDUCTION: This exam was performed according to the departmental dose-optimization program which includes automated exposure control, adjustment of the mA and/or kV according to patient size and/or use of iterative reconstruction technique. CONTRAST:  60mL OMNIPAQUE IOHEXOL 350 MG/ML SOLN COMPARISON:  None Available. FINDINGS: Cardiovascular: Satisfactory opacification of the pulmonary arteries to the segmental level. No evidence of pulmonary embolism. Cardiac enlargement with small  low-density pericardial effusion. Extensive atheromatous calcification of the aorta and great vessels. Mediastinum/Nodes: Middle to posterior mediastinal mass measuring approximately 4 by 4 by 3 cm, separate and displacing the esophagus anteriorly into the left. Scalloping is seen of the underlying vertebral body. Lungs/Pleura: Interlobular septal thickening and small layering pleural effusions with adjacent lower lobe atelectasis. Patchy ground-glass opacity in the right upper and right middle lobes. Diffuse airway cuffing. Upper Abdomen: No acute finding Musculoskeletal: Generalized thoracic disc collapse and endplate spurring. No acute or aggressive finding. Mediastinal mass causes scalloping of the T4 and T5 vertebrae that is smooth and benign appearing on reformats. Anasarca. Review of the MIP images confirms the above findings. IMPRESSION: 1. CHF/volume overload. Airspace opacity in the right middle and upper lobes could be alveolar edema or infection. 2. Negative for pulmonary embolism. 3. 4 x 4 x 3 cm mediastinal mass with benign scalloping of the adjacent thoracic spine compatible with benign process such as duplication cyst or nerve sheath tumor. After convalescence, enhanced thoracic CT or MRI could further assess. 4. Extensive atherosclerosis, including the coronary arteries. Electronically Signed   By: Tiburcio Pea M.D.   On: 04/08/2023 04:27   US Venous Img Upper Uni Left  Result Date: 04/08/2023 CLINICAL DATA:  Left upper extremity swelling. EXAM: LEFT UPPER EXTREMITY VENOUS DOPPLER ULTRASOUND TECHNIQUE: Gray-scale sonography with graded compression, as well as color Doppler and duplex ultrasound were performed to evaluate the upper extremity deep venous system from the level of the subclavian vein and including the jugular, axillary, basilic, radial, ulnar and upper cephalic vein. Spectral Doppler was utilized to evaluate flow at rest and with distal augmentation maneuvers. COMPARISON:  None  Available. FINDINGS: Contralateral Subclavian Vein: Respiratory phasicity is normal and symmetric with the symptomatic side. No evidence of thrombus. Normal compressibility. Internal Jugular Vein: Evidence of nonocclusive thrombus with abnormal compressibility, respiratory phasicity and response to augmentation. Subclavian Vein: No evidence of thrombus. Normal compressibility, respiratory phasicity and response to augmentation. Axillary Vein: No evidence of thrombus. Normal compressibility, respiratory phasicity and response to augmentation. Cephalic Vein: No evidence of thrombus. Normal compressibility, respiratory phasicity and response to augmentation. Basilic Vein: No evidence of thrombus. Normal compressibility, respiratory phasicity and response to augmentation. Brachial Veins: Evidence of occlusive thrombus with abnormal compressibility, respiratory phasicity and response to augmentation. Radial Veins: No evidence of thrombus. Normal compressibility, respiratory phasicity and response to augmentation. Ulnar Veins: No evidence of thrombus. Normal compressibility, respiratory phasicity and response to augmentation. Venous Reflux:  None visualized. Other Findings:  None visualized. IMPRESSION: 1. Nonocclusive DVT within the LEFT internal jugular vein. 2. Occlusive superficial thrombophlebitis involving the LEFT basilic vein. Electronically Signed   By: Aram Candela M.D.   On: 04/08/2023 00:40   DG Chest Portable 1 View  Result Date: 04/08/2023 CLINICAL DATA:  Chest pain and shortness of breath. EXAM: PORTABLE CHEST 1 VIEW COMPARISON:  April 09, 2021 FINDINGS: The cardiac silhouette is mildly enlarged and unchanged in size. There is marked severity calcification of the thoracic aorta. Mild, diffuse, chronic appearing increased lung markings are seen with mild atelectasis and/or infiltrate noted within the retrocardiac region of the left lung base. No pleural effusion or pneumothorax is identified.  Multilevel degenerative changes seen throughout the thoracic spine. IMPRESSION: Stable cardiomegaly with mild left basilar atelectasis and/or infiltrate. Electronically Signed   By: Aram Candela M.D.   On: 04/08/2023 00:36   CT Head Wo Contrast  Result Date: 04/08/2023 CLINICAL DATA:  Intermittent confusion, markedly elevated blood pressure. Query hemorrhagic CVA. Nausea and vomiting. EXAM: CT HEAD WITHOUT CONTRAST TECHNIQUE: Contiguous axial images were obtained from the base of the skull through the vertex without intravenous contrast. RADIATION DOSE REDUCTION: This exam was performed according to the departmental dose-optimization program which includes automated exposure control, adjustment of the mA and/or kV according to patient size and/or use of iterative reconstruction technique. COMPARISON:  CT and MRI 02/26/2021 FINDINGS: Brain: No intracranial hemorrhage, mass effect, or evidence of acute infarct. No hydrocephalus. No extra-axial fluid collection. Age-commensurate cerebral atrophy and chronic small vessel ischemic disease. Physiologic basal ganglia calcifications. Vascular: No hyperdense vessel. Intracranial arterial calcification. Skull: No fracture or focal lesion. Sinuses/Orbits: No acute finding.  Right globe prosthesis. Other: None. IMPRESSION: No acute intracranial abnormality. Electronically Signed   By: Minerva Fester M.D.   On: 04/08/2023 00:14      Subjective: Seen and examined on day of DC.  Medically appropriate for discharge to SNF.  Discharge Exam: Vitals:   04/14/23 0435 04/14/23 0739  BP: (!) 151/66 (!) 148/66  Pulse: (!) 55 (!) 57  Resp: 18 16  Temp: 97.9 F (36.6 C) 98.2 F (36.8 C)  SpO2: 97% 100%   Vitals:   04/13/23 1957 04/13/23 2337 04/14/23 0435 04/14/23 0739  BP: (!) 151/81 (!) 140/58 (!) 151/66 (!) 148/66  Pulse: 64 61 (!) 55 (!) 57  Resp: 17 18 18 16   Temp: 98.7 F (37.1 C) 98.3 F (36.8 C) 97.9 F (36.6 C) 98.2 F (36.8 C)  TempSrc:     Oral  SpO2: 94% 98% 97% 100%  Height:        General: Pt is alert, awake, not in acute distress Cardiovascular: RRR, S1/S2 +, no rubs, no gallops Respiratory: CTA bilaterally, no wheezing, no rhonchi Abdominal: Soft, NT, ND, bowel sounds + Extremities: no edema, no cyanosis    The results of significant diagnostics from this hospitalization (including imaging, microbiology, ancillary and laboratory) are listed below for reference.     Microbiology: No results found for this or any previous visit (from the past 240 hour(s)).   Labs: BNP (last 3 results) Recent Labs    04/07/23 2154  BNP 840.1*   Basic Metabolic Panel: Recent Labs  Lab 04/07/23 2201 04/08/23 0640 04/09/23 0551 04/10/23 0419 04/11/23 1425 04/12/23 0424  NA  --  136 139 141 135 137  K  --  2.2* 3.0* 3.7 5.7* 4.8  CL  --  95* 103 107 105 106  CO2  --  29 25 25 22 23   GLUCOSE  --  135* 111* 132* 129* 105*  BUN  --  7* 9 11 9 9   CREATININE  --  0.45 0.49 0.75 0.64 0.59  CALCIUM  --  6.8* 7.6* 8.5* 8.6* 8.5*  MG 1.4*  --  1.7  --  1.8  --    Liver Function Tests: Recent Labs  Lab 04/07/23 2154  AST 23  ALT 17  ALKPHOS 97  BILITOT 1.0  PROT 6.8  ALBUMIN 2.7*   Recent Labs  Lab 04/07/23 2154  LIPASE 21   No results for input(s): "AMMONIA" in the last 168 hours. CBC: Recent Labs  Lab 04/07/23 2154 04/08/23 0640 04/09/23 0551 04/10/23 0419  WBC 11.7* 8.8 7.6 5.9  NEUTROABS 10.2*  --   --  4.6  HGB 12.5 10.7* 10.5* 10.5*  HCT 38.3 32.1* 30.8* 31.9*  MCV 85.1 83.6 80.6 83.9  PLT 377 350 350 323   Cardiac Enzymes: No results for input(s): "CKTOTAL", "CKMB", "CKMBINDEX", "TROPONINI" in the last 168 hours. BNP: Invalid input(s): "POCBNP" CBG: No results for input(s): "GLUCAP" in the last 168 hours. D-Dimer No results for input(s): "DDIMER" in the last 72 hours. Hgb A1c No results for input(s): "HGBA1C" in the last 72 hours. Lipid Profile No results for input(s): "CHOL", "HDL",  "LDLCALC", "TRIG", "CHOLHDL", "LDLDIRECT" in the last 72 hours. Thyroid function studies No results for input(s): "TSH", "T4TOTAL", "T3FREE", "THYROIDAB" in the last 72 hours.  Invalid input(s): "FREET3" Anemia work up No results for input(s): "VITAMINB12", "FOLATE", "FERRITIN", "TIBC", "IRON", "RETICCTPCT" in the last 72 hours. Urinalysis    Component Value Date/Time   COLORURINE AMBER (A) 02/25/2021 2119   APPEARANCEUR Clear 04/08/2022 1525   LABSPEC 1.017 02/25/2021 2119   PHURINE 5.0 02/25/2021 2119   GLUCOSEU Negative 04/08/2022 1525   HGBUR NEGATIVE 02/25/2021 2119   BILIRUBINUR Negative 04/08/2022 1525   KETONESUR 5 (A) 02/25/2021 2119   PROTEINUR Negative 04/08/2022 1525   PROTEINUR 30 (A) 02/25/2021 2119   UROBILINOGEN 0.2 09/24/2020 1615   NITRITE Negative 04/08/2022 1525   NITRITE NEGATIVE 02/25/2021 2119   LEUKOCYTESUR Negative 04/08/2022 1525   LEUKOCYTESUR TRACE (A) 02/25/2021 2119   Sepsis Labs Recent Labs  Lab 04/07/23 2154 04/08/23 0640 04/09/23 0551 04/10/23 0419  WBC 11.7* 8.8 7.6 5.9   Microbiology No results found for this or any previous visit (from the past 240 hour(s)).   Time coordinating discharge: Over 30 minutes  SIGNED:   Tresa Moore, MD  [facility] 04/14/2023, 10:41 AM Pager   If 7PM-7AM, please contact night-coverage

## 2023-04-14 NOTE — Progress Notes (Signed)
Physical Therapy Treatment Patient Details Name: Lisa [name] MRN: [id number] DOB: 31-Aug-1945 Today's Date: 04/14/2023   History of Present Illness Pt is a 77 y.o. female with medical history significant for CHF, type 2 diabetes mellitus, lower extremity DVT, depression, hypertension, dyslipidemia, grade 2 diastolic dysfunction and right eye blindness, who presented to the ED on 04/07/23 with acute onset of nausea and generalized weakness over the last 2 to 3 days.    PT Comments  Pt alert and oriented, denies pain, but reports increased fatigue from decreased mobility since last PT session. Pt performed bed mobility with supervision and required many rest breaks and increased time to follow simple commands 2/2 fatigue. Sitting EOB BP 119/59, HR 58 bpm. Pt initially stood with SPC, but very unsteady and required min-modA to maintain balance. With RW, pt able to perform STS and amb with CGA. Decreased distance with ambulation this session vs previous session 2/2 pt reporting increased fatigue, LE weakness, and stiffness. Sitting EOB BP 136/102, HR 58 bpm after mobility. Pt would continue to benefit from skilled therapy to address functional impairments and progress toward mobility goals.    If plan is discharge home, recommend the following: A little help with walking and/or transfers;A little help with bathing/dressing/bathroom;Assistance with cooking/housework;Assist for transportation;Help with stairs or ramp for entrance   Can travel by private vehicle     Yes  Equipment Recommendations  Other (comment) (defer to next level of care)    Recommendations for Other Services       Precautions / Restrictions Precautions Precautions: Fall Restrictions Weight Bearing Restrictions: No     Mobility  Bed Mobility Overal bed mobility: Needs Assistance Bed Mobility: Supine to Sit, Sit to Supine     Supine to sit: HOB elevated, Supervision Sit to supine: HOB elevated, Supervision         Transfers Overall transfer level: Needs assistance Equipment used: Rolling walker (2 wheels) Transfers: Sit to/from Stand Sit to Stand: Contact guard assist           General transfer comment: CGA to stand from EOB with RW- first trialed STS with HHAx1, but pt very unsteady and requiring min-modA to maintain balance 2/2 pt reporting increased weakness vs previous session    Ambulation/Gait Ambulation/Gait assistance: Contact guard assist Gait Distance (Feet): 60 Feet Assistive device: Rolling walker (2 wheels) Gait Pattern/deviations: WFL(Within Functional Limits) Gait velocity: decreased     General Gait Details: pt stable with RW, but unable to tolerate > 60' 2/2 increased fatigue and weakness with ambulation vs previous session   Stairs             Wheelchair Mobility     Tilt Bed    Modified Rankin (Stroke Patients Only)       Balance Overall balance assessment: Needs assistance Sitting-balance support: Feet unsupported, No upper extremity supported Sitting balance-Leahy Scale: Good     Standing balance support: Bilateral upper extremity supported, During functional activity Standing balance-Leahy Scale: Fair                              Cognition Arousal: Alert Behavior During Therapy: WFL for tasks assessed/performed Overall Cognitive Status: Within Functional Limits for tasks assessed                                          Exercises  General Comments        Pertinent Vitals/Pain Pain Assessment Pain Assessment: No/denies pain    Home Living                          Prior Function            PT Goals (current goals can now be found in the care plan section) Acute Rehab PT Goals Patient Stated Goal: to go to rehab facility to get better. PT Goal Formulation: With patient Time For Goal Achievement: 04/25/23 Potential to Achieve Goals: Good Progress towards PT goals: Progressing  toward goals    Frequency    Min 1X/week      PT Plan      Co-evaluation              AM-PAC PT "6 Clicks" Mobility   Outcome Measure  Help needed turning from your back to your side while in a flat bed without using bedrails?: None Help needed moving from lying on your back to sitting on the side of a flat bed without using bedrails?: None Help needed moving to and from a bed to a chair (including a wheelchair)?: A Little Help needed standing up from a chair using your arms (e.g., wheelchair or bedside chair)?: A Little Help needed to walk in hospital room?: A Little Help needed climbing 3-5 steps with a railing? : A Lot 6 Click Score: 19    End of Session Equipment Utilized During Treatment: Gait belt Activity Tolerance: Patient tolerated treatment well;Patient limited by fatigue Patient left: in bed;with bed alarm set;with call bell/phone within reach Nurse Communication: Mobility status PT Visit Diagnosis: Unsteadiness on feet (R26.81);Other abnormalities of gait and mobility (R26.89);Muscle weakness (generalized) (M62.81);Difficulty in walking, not elsewhere classified (R26.2)     Time: 4098-1191 PT Time Calculation (min) (ACUTE ONLY): 29 min  Charges:    $Gait Training: 8-22 mins $Therapeutic Activity: 8-22 mins PT General Charges $$ ACUTE PT VISIT: 1 Visit                      Shauna Hugh, SPT 04/14/2023, 11:45 AM

## 2023-04-14 NOTE — Progress Notes (Signed)
                                                     Palliative Care Progress Note   Patient Name: Lisa [name]       Date: 04/14/2023 DOB: [birth year]  Age: 77 y.o. MRN#: [id number] Attending Physician: Tresa Moore, MD Primary Care Physician: Alba Cory, MD Admit Date: 04/07/2023  Chart reviewed. Discharge summary is in place.  Plans for discharge today.  No acute palliative concerns at this time.  PMT remains available to patient throughout this hospitalization.  Thank you for allowing the Palliative Medicine Team to assist in the care of [facility].  Samara Deist L. Bonita Quin, DNP, FNP-BC Palliative Medicine Team    No charge

## 2023-04-14 NOTE — Care Management Important Message (Signed)
Important Message  Patient Details  Name: Lisa [name] MRN: [id number] Date of Birth: [birth year]   Important Message Given:  Yes - Medicare IM     Bernadette Hoit 04/14/2023, 9:53 AM

## 2023-04-16 DIAGNOSIS — E119 Type 2 diabetes mellitus without complications: Secondary | ICD-10-CM | POA: Diagnosis not present

## 2023-04-16 DIAGNOSIS — I5032 Chronic diastolic (congestive) heart failure: Secondary | ICD-10-CM | POA: Diagnosis not present

## 2023-04-16 DIAGNOSIS — I110 Hypertensive heart disease with heart failure: Secondary | ICD-10-CM | POA: Diagnosis not present

## 2023-04-16 DIAGNOSIS — W010XXA Fall on same level from slipping, tripping and stumbling without subsequent striking against object, initial encounter: Secondary | ICD-10-CM | POA: Diagnosis not present

## 2023-04-16 DIAGNOSIS — I82622 Acute embolism and thrombosis of deep veins of left upper extremity: Secondary | ICD-10-CM | POA: Diagnosis not present

## 2023-04-16 DIAGNOSIS — E785 Hyperlipidemia, unspecified: Secondary | ICD-10-CM | POA: Diagnosis not present

## 2023-04-16 DIAGNOSIS — E876 Hypokalemia: Secondary | ICD-10-CM | POA: Diagnosis not present

## 2023-04-16 DIAGNOSIS — E440 Moderate protein-calorie malnutrition: Secondary | ICD-10-CM | POA: Diagnosis not present

## 2023-04-16 DIAGNOSIS — H409 Unspecified glaucoma: Secondary | ICD-10-CM | POA: Diagnosis not present

## 2023-04-16 DIAGNOSIS — K529 Noninfective gastroenteritis and colitis, unspecified: Secondary | ICD-10-CM | POA: Diagnosis not present

## 2023-04-17 NOTE — Progress Notes (Deleted)
Advanced Heart Failure Clinic Note   Referring Physician: recent admit PCP: Alba Cory, MD (last seen 09/24) PCP-Cardiologist: None    Lisa [name] is a 77 y/o female with a history of DM, hyperlipidemia, HTN, depression, DVT, vascular disease, tobacco use, malnutrition and chronic heart failure.   Admitted 04/09/21 due to shortness of breath and cough due to acute on chronic heart failure. Initially placed on bipap but then able to be weaned down to 5L nasal cannula. Cardiology consult obtained. Given IV lasix with transition to oral diuretics. Patient declined cardiac cath. Discharged after 4 days.   Admitted 04/07/23 due to progressive nausea, weakness, poor p.o. tolerance, persistent loose stools. K+ & Mg replaced. Started initially on intravenous heparin however given that the DVT is nonocclusive and upper extremity and superficial thrombophlebitis does not require anticoagulation will discontinue. Dietician consult made.   Echo 02/26/21: EF 60-65% along with mild LVH.   She presents today for a HF follow-up visit with, although hasn't been seen in 2 years. She presents with a chief complaint of     ROS: All systems negative except as listed in HPI, PMH and Problem List.  SH:  Social History   Socioeconomic History   Marital status: Widowed    Spouse name: Not on file   Number of children: 0   Years of education: Not on file   Highest education level: Some college, no degree  Occupational History   Occupation: Retired  Tobacco Use   Smoking status: Every Day    Current packs/day: 0.25    Average packs/day: 0.3 packs/day for 40.2 years (10.0 ttl pk-yrs)    Types: Cigarettes    Start date: 03/16/2000   Smokeless tobacco: Never  Vaping Use   Vaping status: Never Used  Substance and Sexual Activity   Alcohol use: Yes    Comment: Wine Ocassionally   Drug use: No   Sexual activity: Not Currently  Other Topics Concern   Not on file  Social History Narrative   Not  on file   Social Determinants of Health   Financial Resource Strain: Medium Risk (04/08/2023)   Overall Financial Resource Strain (CARDIA)    Difficulty of Paying Living Expenses: Somewhat hard  Food Insecurity: No Food Insecurity (04/08/2023)   Hunger Vital Sign    Worried About Running Out of Food in the Last Year: Never true    Ran Out of Food in the Last Year: Never true  Transportation Needs: No Transportation Needs (04/08/2023)   PRAPARE - Administrator, Civil Service (Medical): No    Lack of Transportation (Non-Medical): No  Physical Activity: Insufficiently Active (03/26/2021)   Exercise Vital Sign    Days of Exercise per Week: 3 days    Minutes of Exercise per Session: 20 min  Stress: No Stress Concern Present (03/26/2021)   [employer] of Occupational Health - Occupational Stress Questionnaire    Feeling of Stress : Only a little  Social Connections: Socially Isolated (03/19/2022)   Social Connection and Isolation Panel [NHANES]    Frequency of Communication with Friends and Family: More than three times a week    Frequency of Social Gatherings with Friends and Family: Once a week    Attends Religious Services: Never    Database administrator or Organizations: No    Attends Banker Meetings: Never    Marital Status: Widowed  Intimate Partner Violence: Not At Risk (04/08/2023)   Humiliation, Afraid, Rape, and Kick  questionnaire    Fear of Current or Ex-Partner: No    Emotionally Abused: No    Physically Abused: No    Sexually Abused: No    FH:  Family History  Problem Relation Age of Onset   Heart disease Father    Diabetes Mother    Diabetes Brother    Kidney disease Brother        Transplant   Heart disease Brother    Diabetes Sister    Diabetes Sister     Past Medical History:  Diagnosis Date   Blindness of right eye    CHF (congestive heart failure) (HCC)    Depression    Diabetes mellitus without complication (HCC)     DVT of leg (deep venous thrombosis) (HCC) 2011   Glaucoma    Grade II diastolic dysfunction    Hyperlipidemia    Hypertension    Vascular disease     Current Outpatient Medications  Medication Sig Dispense Refill   amLODipine (NORVASC) 10 MG tablet Take 1 tablet (10 mg total) by mouth daily.     aspirin 81 MG tablet Take 81 mg by mouth daily.     atropine 1 % ophthalmic solution Place 1 drop into the left eye 3 (three) times daily.     brimonidine (ALPHAGAN) 0.2 % ophthalmic solution Place 1 drop into both eyes 2 (two) times daily.  3   citalopram (CELEXA) 10 MG tablet Take 1 tablet (10 mg total) by mouth daily. 90 tablet 1   clopidogrel (PLAVIX) 75 MG tablet Take 1 tablet (75 mg total) by mouth daily. 90 tablet 1   Cyanocobalamin (B-12) 1000 MCG SUBL Place 1 tablet under the tongue daily. 90 tablet 1   dapagliflozin propanediol (FARXIGA) 10 MG TABS tablet TAKE 1 TABLET BY MOUTH EVERY DAY BEFORE BREAKFAST 90 tablet 1   dorzolamide-timolol (COSOPT) 22.3-6.8 MG/ML ophthalmic solution Place 1 drop into both eyes 2 (two) times daily.     feeding supplement (ENSURE ENLIVE / ENSURE PLUS) LIQD Take 237 mLs by mouth 2 (two) times daily between meals.     hydrALAZINE (APRESOLINE) 50 MG tablet Take 1 tablet (50 mg total) by mouth every 8 (eight) hours.     losartan (COZAAR) 100 MG tablet Take 1 tablet (100 mg total) by mouth daily.     metoprolol succinate (TOPROL XL) 25 MG 24 hr tablet Take 1 tablet (25 mg total) by mouth daily. Take with or immediately following a meal. 90 tablet 1   Multiple Vitamin (MULTIVITAMIN) tablet Take 1 tablet by mouth daily.     nitroGLYCERIN (NITROSTAT) 0.4 MG SL tablet Place 1 tablet (0.4 mg total) under the tongue every 5 (five) minutes as needed for chest pain. 15 tablet 12   polyvinyl alcohol (LIQUIFILM TEARS) 1.4 % ophthalmic solution Place 2 drops into the right eye as needed for dry eyes.     prednisoLONE acetate (PRED FORTE) 1 % ophthalmic suspension Place 1 drop  into the left eye 4 (four) times daily.     psyllium (HYDROCIL/METAMUCIL) 95 % PACK Take 1 packet by mouth daily.     rosuvastatin (CRESTOR) 10 MG tablet Take 1 tablet (10 mg total) by mouth daily. 90 tablet 1   sitaGLIPtin (JANUVIA) 100 MG tablet Take 1 tablet (100 mg total) by mouth daily. 90 tablet 1   Vibegron (GEMTESA) 75 MG TABS Take 75 mg by mouth daily. 30 tablet 11   Vitamin D, Ergocalciferol, (DRISDOL) 1.25 MG (50000 UNIT) CAPS capsule  TAKE 1 CAPSULE (50,000 UNITS TOTAL) BY MOUTH EVERY 7 (SEVEN) DAYS 8 capsule 1   No current facility-administered medications for this visit.      PHYSICAL EXAM:  General:  Well appearing. No resp difficulty HEENT: normal Neck: supple. JVP flat. No lymphadenopathy or thryomegaly appreciated. Cor: PMI normal. Regular rate & rhythm. No rubs, gallops or murmurs. Lungs: clear Abdomen: soft, nontender, nondistended. No hepatosplenomegaly. No bruits or masses.  Extremities: no cyanosis, clubbing, rash, edema Neuro: alert & orientedx3, cranial nerves grossly intact. Moves all 4 extremities w/o difficulty. Affect pleasant.   ECG:   ASSESSMENT & PLAN:    1: Chronic heart failure with preserved ejection fraction- - suspect due to  - NYHA II - euvolemic today - weighing daily; instructed to call for an overnight weight gain of > 2 pounds or a weekly weight gain of > 5 pounds - Echo 02/26/21: EF 60-65% along with mild LVH.  - continue - BNP 04/07/23 was 840.1  2: HTN- - BP  - saw PCP Zane Herald) 09/24 - BMP 04/12/23 reviewed and showed sodium 137, potassium 4.8, creatinine 0.59 and GFR >60  3: DM- - A1c 01/30/23 was 6.4%   4: Depression- - under control at this time - currently taking citalopram   .

## 2023-04-20 ENCOUNTER — Ambulatory Visit: Payer: Medicare Other | Attending: Family | Admitting: Family

## 2023-04-20 ENCOUNTER — Encounter: Payer: Medicare Other | Admitting: Family

## 2023-04-20 ENCOUNTER — Encounter: Payer: Self-pay | Admitting: Family

## 2023-04-20 VITALS — BP 143/63 | HR 77 | Wt 114.0 lb

## 2023-04-20 DIAGNOSIS — I5032 Chronic diastolic (congestive) heart failure: Secondary | ICD-10-CM | POA: Diagnosis not present

## 2023-04-20 DIAGNOSIS — E1169 Type 2 diabetes mellitus with other specified complication: Secondary | ICD-10-CM | POA: Diagnosis not present

## 2023-04-20 DIAGNOSIS — I10 Essential (primary) hypertension: Secondary | ICD-10-CM | POA: Diagnosis not present

## 2023-04-20 DIAGNOSIS — Z79899 Other long term (current) drug therapy: Secondary | ICD-10-CM | POA: Insufficient documentation

## 2023-04-20 DIAGNOSIS — I82722 Chronic embolism and thrombosis of deep veins of left upper extremity: Secondary | ICD-10-CM | POA: Diagnosis not present

## 2023-04-20 DIAGNOSIS — I82622 Acute embolism and thrombosis of deep veins of left upper extremity: Secondary | ICD-10-CM | POA: Diagnosis not present

## 2023-04-20 DIAGNOSIS — I5020 Unspecified systolic (congestive) heart failure: Secondary | ICD-10-CM | POA: Diagnosis not present

## 2023-04-20 DIAGNOSIS — Z7984 Long term (current) use of oral hypoglycemic drugs: Secondary | ICD-10-CM | POA: Diagnosis not present

## 2023-04-20 DIAGNOSIS — E785 Hyperlipidemia, unspecified: Secondary | ICD-10-CM | POA: Insufficient documentation

## 2023-04-20 DIAGNOSIS — Z7902 Long term (current) use of antithrombotics/antiplatelets: Secondary | ICD-10-CM | POA: Insufficient documentation

## 2023-04-20 DIAGNOSIS — F32A Depression, unspecified: Secondary | ICD-10-CM | POA: Insufficient documentation

## 2023-04-20 DIAGNOSIS — E119 Type 2 diabetes mellitus without complications: Secondary | ICD-10-CM | POA: Diagnosis not present

## 2023-04-20 DIAGNOSIS — E440 Moderate protein-calorie malnutrition: Secondary | ICD-10-CM | POA: Diagnosis not present

## 2023-04-20 DIAGNOSIS — Z86718 Personal history of other venous thrombosis and embolism: Secondary | ICD-10-CM | POA: Diagnosis not present

## 2023-04-20 DIAGNOSIS — E1151 Type 2 diabetes mellitus with diabetic peripheral angiopathy without gangrene: Secondary | ICD-10-CM

## 2023-04-20 DIAGNOSIS — R197 Diarrhea, unspecified: Secondary | ICD-10-CM | POA: Diagnosis not present

## 2023-04-20 DIAGNOSIS — I428 Other cardiomyopathies: Secondary | ICD-10-CM | POA: Insufficient documentation

## 2023-04-20 DIAGNOSIS — W19XXXD Unspecified fall, subsequent encounter: Secondary | ICD-10-CM | POA: Diagnosis not present

## 2023-04-20 DIAGNOSIS — Z833 Family history of diabetes mellitus: Secondary | ICD-10-CM | POA: Diagnosis not present

## 2023-04-20 DIAGNOSIS — I110 Hypertensive heart disease with heart failure: Secondary | ICD-10-CM | POA: Insufficient documentation

## 2023-04-20 NOTE — Progress Notes (Signed)
Advanced Heart Failure Clinic Note   Referring Physician: recent admit PCP: Alba Cory, MD (last seen 09/24) Cardiologist: None    Lisa [name] is a 77 y/o female with a history of DM, hyperlipidemia, HTN, depression, DVT, right eye blindness, vascular disease, tobacco use, malnutrition and chronic heart failure.   Admitted 04/09/21 due to shortness of breath and cough due to acute on chronic heart failure. Initially placed on bipap but then able to be weaned down to 5L nasal cannula. Cardiology consult obtained. Given IV lasix with transition to oral diuretics. Patient declined cardiac cath. Discharged after 4 days.   Admitted 04/07/23 due to progressive nausea, weakness, poor p.o. tolerance, persistent loose stools. K+ & Mg replaced. Started initially on intravenous heparin however given that the DVT is nonocclusive and upper extremity and superficial thrombophlebitis does not require anticoagulation will discontinue. Dietician consult made.   Echo 02/26/21: EF 60-65% along with mild LVH.   She presents today for a HF follow-up visit, although hasn't been seen in 2 years with a chief complaint of minimal shortness of breath with moderate exertion. Chronic in nature. Has associated fatigue, intermittent palpitations, looser stools (better) and urinary incontinence (better) along with this. Denies chest pain, cough, dizziness or difficulty sleeping. Sleeping well on 1-2 pillows.  Reports a good appetite. Has decreased salt usage. Currently receiving PT at [facility].   Says that she fell ~ 4 days ago in the bathroom at the facility and landed on her side. Says that she's been xrayed because she was having left sided rib pain which is still present, although better.    She voices frustration with "all the medications" that she takes and her concern about being able to manage this once she returns home. Her vision continues to deteriorate and she is blind in her right eye.   ROS: All  systems negative except as listed in HPI, PMH and Problem List.  SH:  Social History   Socioeconomic History   Marital status: Widowed    Spouse name: Not on file   Number of children: 0   Years of education: Not on file   Highest education level: Some college, no degree  Occupational History   Occupation: Retired  Tobacco Use   Smoking status: Every Day    Current packs/day: 0.25    Average packs/day: 0.2 packs/day for 40.2 years (10.0 ttl pk-yrs)    Types: Cigarettes    Start date: 03/16/2000   Smokeless tobacco: Never  Vaping Use   Vaping status: Never Used  Substance and Sexual Activity   Alcohol use: Yes    Comment: Wine Ocassionally   Drug use: No   Sexual activity: Not Currently  Other Topics Concern   Not on file  Social History Narrative   Not on file   Social Determinants of Health   Financial Resource Strain: Medium Risk (04/08/2023)   Overall Financial Resource Strain (CARDIA)    Difficulty of Paying Living Expenses: Somewhat hard  Food Insecurity: No Food Insecurity (04/08/2023)   Hunger Vital Sign    Worried About Running Out of Food in the Last Year: Never true    Ran Out of Food in the Last Year: Never true  Transportation Needs: No Transportation Needs (04/08/2023)   PRAPARE - Administrator, Civil Service (Medical): No    Lack of Transportation (Non-Medical): No  Physical Activity: Insufficiently Active (03/26/2021)   Exercise Vital Sign    Days of Exercise per Week: 3  days    Minutes of Exercise per Session: 20 min  Stress: No Stress Concern Present (03/26/2021)   [employer] of Occupational Health - Occupational Stress Questionnaire    Feeling of Stress : Only a little  Social Connections: Socially Isolated (03/19/2022)   Social Connection and Isolation Panel [NHANES]    Frequency of Communication with Friends and Family: More than three times a week    Frequency of Social Gatherings with Friends and Family: Once a week     Attends Religious Services: Never    Database administrator or Organizations: No    Attends Banker Meetings: Never    Marital Status: Widowed  Intimate Partner Violence: Not At Risk (04/08/2023)   Humiliation, Afraid, Rape, and Kick questionnaire    Fear of Current or Ex-Partner: No    Emotionally Abused: No    Physically Abused: No    Sexually Abused: No    FH:  Family History  Problem Relation Age of Onset   Heart disease Father    Diabetes Mother    Diabetes Brother    Kidney disease Brother        Transplant   Heart disease Brother    Diabetes Sister    Diabetes Sister     Past Medical History:  Diagnosis Date   Blindness of right eye    CHF (congestive heart failure) (HCC)    Depression    Diabetes mellitus without complication (HCC)    DVT of leg (deep venous thrombosis) (HCC) 2011   Glaucoma    Grade II diastolic dysfunction    Hyperlipidemia    Hypertension    Vascular disease     Current Outpatient Medications  Medication Sig Dispense Refill   amLODipine (NORVASC) 10 MG tablet Take 1 tablet (10 mg total) by mouth daily.     aspirin 81 MG tablet Take 81 mg by mouth daily.     atropine 1 % ophthalmic solution Place 1 drop into the left eye 3 (three) times daily.     brimonidine (ALPHAGAN) 0.2 % ophthalmic solution Place 1 drop into both eyes 2 (two) times daily.  3   citalopram (CELEXA) 10 MG tablet Take 1 tablet (10 mg total) by mouth daily. 90 tablet 1   clopidogrel (PLAVIX) 75 MG tablet Take 1 tablet (75 mg total) by mouth daily. 90 tablet 1   Cyanocobalamin (B-12) 1000 MCG SUBL Place 1 tablet under the tongue daily. 90 tablet 1   dapagliflozin propanediol (FARXIGA) 10 MG TABS tablet TAKE 1 TABLET BY MOUTH EVERY DAY BEFORE BREAKFAST 90 tablet 1   dorzolamide-timolol (COSOPT) 22.3-6.8 MG/ML ophthalmic solution Place 1 drop into both eyes 2 (two) times daily.     feeding supplement (ENSURE ENLIVE / ENSURE PLUS) LIQD Take 237 mLs by mouth 2 (two)  times daily between meals.     hydrALAZINE (APRESOLINE) 50 MG tablet Take 1 tablet (50 mg total) by mouth every 8 (eight) hours.     losartan (COZAAR) 100 MG tablet Take 1 tablet (100 mg total) by mouth daily.     metoprolol succinate (TOPROL XL) 25 MG 24 hr tablet Take 1 tablet (25 mg total) by mouth daily. Take with or immediately following a meal. 90 tablet 1   Multiple Vitamin (MULTIVITAMIN) tablet Take 1 tablet by mouth daily.     nitroGLYCERIN (NITROSTAT) 0.4 MG SL tablet Place 1 tablet (0.4 mg total) under the tongue every 5 (five) minutes as needed for chest pain. 15  tablet 12   polyvinyl alcohol (LIQUIFILM TEARS) 1.4 % ophthalmic solution Place 2 drops into the right eye as needed for dry eyes.     prednisoLONE acetate (PRED FORTE) 1 % ophthalmic suspension Place 1 drop into the left eye 4 (four) times daily.     psyllium (HYDROCIL/METAMUCIL) 95 % PACK Take 1 packet by mouth daily.     rosuvastatin (CRESTOR) 10 MG tablet Take 1 tablet (10 mg total) by mouth daily. 90 tablet 1   sitaGLIPtin (JANUVIA) 100 MG tablet Take 1 tablet (100 mg total) by mouth daily. 90 tablet 1   Vibegron (GEMTESA) 75 MG TABS Take 75 mg by mouth daily. 30 tablet 11   Vitamin D, Ergocalciferol, (DRISDOL) 1.25 MG (50000 UNIT) CAPS capsule TAKE 1 CAPSULE (50,000 UNITS TOTAL) BY MOUTH EVERY 7 (SEVEN) DAYS 8 capsule 1   No current facility-administered medications for this visit.   Vitals:   04/20/23 1136  BP: (!) 143/63  Pulse: 77  Weight: 114 lb (51.7 kg)   Wt Readings from Last 3 Encounters:  04/20/23 114 lb (51.7 kg)  02/25/23 118 lb (53.5 kg)  02/23/23 123 lb 4.8 oz (55.9 kg)   Lab Results  Component Value Date   CREATININE 0.59 04/12/2023   CREATININE 0.64 04/11/2023   CREATININE 0.75 04/10/2023   PHYSICAL EXAM:  General:  Well appearing. No resp difficulty HEENT: normal Neck: supple. JVP flat. No lymphadenopathy or thryomegaly appreciated. Cor: PMI normal. Regular rate & rhythm. No rubs,  gallops or murmurs. Lungs: clear Abdomen: soft, nontender, nondistended. No hepatosplenomegaly. No bruits or masses.  Extremities: no cyanosis, clubbing, rash, edema Neuro: alert & oriented x3, cranial nerves grossly intact. Moves all 4 extremities w/o difficulty. Affect pleasant.   ECG: not done   ASSESSMENT & PLAN:    1: NICM with preserved ejection fraction- - suspect due to HTN - NYHA II - euvolemic today - weighing daily; instructed to call for an overnight weight gain of > 2 pounds or a weekly weight gain of > 5 pounds - Echo 02/26/21: EF 60-65% along with mild LVH.  - will get echo updated - continue farxiga 10mg  daily - continue losartan 100mg  daily - continue metoprolol succinate 25mg  daily - discussed adding spironolactone but she would like to defer this for now because of all of the medications that she currently is taking; explained that if we started this medication, we might be able to stop amlodipine or hydralazine - she may benefit from medication bubble packaging once she returns home since she has had such vision loss - currently getting PT at SNF; had a fall ~ 4 days ago in the facility and says that she's had rib xrays completed - BNP 04/07/23 was 840.1  2: HTN- - BP 143/63 - continue amlodipine 10mg  daily - continue hydralazine 50mg  TID - saw PCP Zane Herald) 09/24 - BMP 04/12/23 reviewed and showed sodium 137, potassium 4.8, creatinine 0.59 and GFR >60  3: DM- - A1c 01/30/23 was 6.4% - continue [place] 100mg  daily - continue farxiga 10mg  daily  4: Depression- - under control at this time - currently taking citalopram 10mg  daily  5: DVT of left upper extremity- - no need for anticoagulation is DVT is nonocclusive - continue plavix 75mg  daily - resolved; no further edema of left arm  6: Dyslipidemia- - continue rosuvastatin 10mg  daily - LDL 02/03/23 was52  Return 1 week after echo.

## 2023-04-20 NOTE — Patient Instructions (Addendum)
Please have your echo completed on Thursday, December 26th at 11AM. You will check in for this at the [facility]. You have to arrive 15 MINS EARLY for preparation, otherwise you will have to reschedule.

## 2023-04-21 DIAGNOSIS — I5020 Unspecified systolic (congestive) heart failure: Secondary | ICD-10-CM | POA: Diagnosis not present

## 2023-04-21 DIAGNOSIS — I110 Hypertensive heart disease with heart failure: Secondary | ICD-10-CM | POA: Diagnosis not present

## 2023-04-21 DIAGNOSIS — I82622 Acute embolism and thrombosis of deep veins of left upper extremity: Secondary | ICD-10-CM | POA: Diagnosis not present

## 2023-04-21 DIAGNOSIS — E1169 Type 2 diabetes mellitus with other specified complication: Secondary | ICD-10-CM | POA: Diagnosis not present

## 2023-04-21 DIAGNOSIS — E440 Moderate protein-calorie malnutrition: Secondary | ICD-10-CM | POA: Diagnosis not present

## 2023-04-21 DIAGNOSIS — W19XXXD Unspecified fall, subsequent encounter: Secondary | ICD-10-CM | POA: Diagnosis not present

## 2023-04-21 DIAGNOSIS — R197 Diarrhea, unspecified: Secondary | ICD-10-CM | POA: Diagnosis not present

## 2023-04-22 DIAGNOSIS — I82622 Acute embolism and thrombosis of deep veins of left upper extremity: Secondary | ICD-10-CM | POA: Diagnosis not present

## 2023-04-22 DIAGNOSIS — R0782 Intercostal pain: Secondary | ICD-10-CM | POA: Diagnosis not present

## 2023-04-22 DIAGNOSIS — I110 Hypertensive heart disease with heart failure: Secondary | ICD-10-CM | POA: Diagnosis not present

## 2023-04-22 DIAGNOSIS — I5020 Unspecified systolic (congestive) heart failure: Secondary | ICD-10-CM | POA: Diagnosis not present

## 2023-04-22 DIAGNOSIS — R197 Diarrhea, unspecified: Secondary | ICD-10-CM | POA: Diagnosis not present

## 2023-04-22 DIAGNOSIS — W19XXXD Unspecified fall, subsequent encounter: Secondary | ICD-10-CM | POA: Diagnosis not present

## 2023-04-27 DIAGNOSIS — H409 Unspecified glaucoma: Secondary | ICD-10-CM | POA: Diagnosis not present

## 2023-04-27 DIAGNOSIS — E785 Hyperlipidemia, unspecified: Secondary | ICD-10-CM | POA: Diagnosis not present

## 2023-04-27 DIAGNOSIS — I110 Hypertensive heart disease with heart failure: Secondary | ICD-10-CM | POA: Diagnosis not present

## 2023-04-27 DIAGNOSIS — R197 Diarrhea, unspecified: Secondary | ICD-10-CM | POA: Diagnosis not present

## 2023-04-27 DIAGNOSIS — I5020 Unspecified systolic (congestive) heart failure: Secondary | ICD-10-CM | POA: Diagnosis not present

## 2023-04-27 DIAGNOSIS — E1169 Type 2 diabetes mellitus with other specified complication: Secondary | ICD-10-CM | POA: Diagnosis not present

## 2023-04-27 DIAGNOSIS — R110 Nausea: Secondary | ICD-10-CM | POA: Diagnosis not present

## 2023-04-29 DIAGNOSIS — I5020 Unspecified systolic (congestive) heart failure: Secondary | ICD-10-CM | POA: Diagnosis not present

## 2023-04-29 DIAGNOSIS — R197 Diarrhea, unspecified: Secondary | ICD-10-CM | POA: Diagnosis not present

## 2023-04-29 DIAGNOSIS — E785 Hyperlipidemia, unspecified: Secondary | ICD-10-CM | POA: Diagnosis not present

## 2023-04-29 DIAGNOSIS — H409 Unspecified glaucoma: Secondary | ICD-10-CM | POA: Diagnosis not present

## 2023-04-29 DIAGNOSIS — E876 Hypokalemia: Secondary | ICD-10-CM | POA: Diagnosis not present

## 2023-05-01 ENCOUNTER — Telehealth: Payer: Self-pay | Admitting: Family Medicine

## 2023-05-01 DIAGNOSIS — E785 Hyperlipidemia, unspecified: Secondary | ICD-10-CM | POA: Diagnosis not present

## 2023-05-01 DIAGNOSIS — R0782 Intercostal pain: Secondary | ICD-10-CM | POA: Diagnosis not present

## 2023-05-01 DIAGNOSIS — E1169 Type 2 diabetes mellitus with other specified complication: Secondary | ICD-10-CM | POA: Diagnosis not present

## 2023-05-01 DIAGNOSIS — R197 Diarrhea, unspecified: Secondary | ICD-10-CM | POA: Diagnosis not present

## 2023-05-01 DIAGNOSIS — E440 Moderate protein-calorie malnutrition: Secondary | ICD-10-CM | POA: Diagnosis not present

## 2023-05-01 DIAGNOSIS — I82622 Acute embolism and thrombosis of deep veins of left upper extremity: Secondary | ICD-10-CM | POA: Diagnosis not present

## 2023-05-01 DIAGNOSIS — I110 Hypertensive heart disease with heart failure: Secondary | ICD-10-CM | POA: Diagnosis not present

## 2023-05-01 DIAGNOSIS — W19XXXD Unspecified fall, subsequent encounter: Secondary | ICD-10-CM | POA: Diagnosis not present

## 2023-05-01 DIAGNOSIS — I5020 Unspecified systolic (congestive) heart failure: Secondary | ICD-10-CM | POA: Diagnosis not present

## 2023-05-01 DIAGNOSIS — H409 Unspecified glaucoma: Secondary | ICD-10-CM | POA: Diagnosis not present

## 2023-05-01 DIAGNOSIS — E876 Hypokalemia: Secondary | ICD-10-CM | POA: Diagnosis not present

## 2023-05-01 NOTE — Telephone Encounter (Signed)
Hilbert Corrigan [facility] is calling to report that the patient will be discharging today from [facility] in [place]. Requesting approval For Nursing, PT, OT.   Will Dr. Carlynn Purl be able to follow the patient with these orders?  [phone] Ok to leave VM

## 2023-05-02 DIAGNOSIS — F32A Depression, unspecified: Secondary | ICD-10-CM | POA: Diagnosis not present

## 2023-05-02 DIAGNOSIS — E782 Mixed hyperlipidemia: Secondary | ICD-10-CM | POA: Diagnosis not present

## 2023-05-02 DIAGNOSIS — H409 Unspecified glaucoma: Secondary | ICD-10-CM | POA: Diagnosis not present

## 2023-05-02 DIAGNOSIS — I82622 Acute embolism and thrombosis of deep veins of left upper extremity: Secondary | ICD-10-CM | POA: Diagnosis not present

## 2023-05-02 DIAGNOSIS — Z7982 Long term (current) use of aspirin: Secondary | ICD-10-CM | POA: Diagnosis not present

## 2023-05-02 DIAGNOSIS — Z7984 Long term (current) use of oral hypoglycemic drugs: Secondary | ICD-10-CM | POA: Diagnosis not present

## 2023-05-02 DIAGNOSIS — E876 Hypokalemia: Secondary | ICD-10-CM | POA: Diagnosis not present

## 2023-05-02 DIAGNOSIS — N3281 Overactive bladder: Secondary | ICD-10-CM | POA: Diagnosis not present

## 2023-05-02 DIAGNOSIS — Z9181 History of falling: Secondary | ICD-10-CM | POA: Diagnosis not present

## 2023-05-02 DIAGNOSIS — I5032 Chronic diastolic (congestive) heart failure: Secondary | ICD-10-CM | POA: Diagnosis not present

## 2023-05-02 DIAGNOSIS — E440 Moderate protein-calorie malnutrition: Secondary | ICD-10-CM | POA: Diagnosis not present

## 2023-05-02 DIAGNOSIS — E559 Vitamin D deficiency, unspecified: Secondary | ICD-10-CM | POA: Diagnosis not present

## 2023-05-02 DIAGNOSIS — I110 Hypertensive heart disease with heart failure: Secondary | ICD-10-CM | POA: Diagnosis not present

## 2023-05-02 DIAGNOSIS — H54415A Blindness right eye category 5, normal vision left eye: Secondary | ICD-10-CM | POA: Diagnosis not present

## 2023-05-02 DIAGNOSIS — F17211 Nicotine dependence, cigarettes, in remission: Secondary | ICD-10-CM | POA: Diagnosis not present

## 2023-05-02 DIAGNOSIS — Z89422 Acquired absence of other left toe(s): Secondary | ICD-10-CM | POA: Diagnosis not present

## 2023-05-02 DIAGNOSIS — K529 Noninfective gastroenteritis and colitis, unspecified: Secondary | ICD-10-CM | POA: Diagnosis not present

## 2023-05-02 DIAGNOSIS — Z7902 Long term (current) use of antithrombotics/antiplatelets: Secondary | ICD-10-CM | POA: Diagnosis not present

## 2023-05-02 DIAGNOSIS — E1140 Type 2 diabetes mellitus with diabetic neuropathy, unspecified: Secondary | ICD-10-CM | POA: Diagnosis not present

## 2023-05-04 ENCOUNTER — Telehealth: Payer: Self-pay

## 2023-05-04 NOTE — Transitions of Care (Post Inpatient/ED Visit) (Signed)
   05/04/2023  Name: Lisa [name] MRN: [id number] DOB: October 28, 1945  Today's TOC FU Call Status: Today's TOC FU Call Status:: Unsuccessful Call (1st Attempt) Unsuccessful Call (1st Attempt) Date: 05/04/23  Attempted to reach the patient regarding the most recent Inpatient/ED visit.  Follow Up Plan: Additional outreach attempts will be made to reach the patient to complete the Transitions of Care (Post Inpatient/ED visit) call.   Signature Karena Addison, LPN [facility] Nurse Health Advisor Direct Dial [phone]

## 2023-05-04 NOTE — Telephone Encounter (Signed)
Orders given.  

## 2023-05-05 DIAGNOSIS — I82622 Acute embolism and thrombosis of deep veins of left upper extremity: Secondary | ICD-10-CM | POA: Diagnosis not present

## 2023-05-05 DIAGNOSIS — E1140 Type 2 diabetes mellitus with diabetic neuropathy, unspecified: Secondary | ICD-10-CM | POA: Diagnosis not present

## 2023-05-05 DIAGNOSIS — N3281 Overactive bladder: Secondary | ICD-10-CM | POA: Diagnosis not present

## 2023-05-05 DIAGNOSIS — I110 Hypertensive heart disease with heart failure: Secondary | ICD-10-CM | POA: Diagnosis not present

## 2023-05-05 DIAGNOSIS — I5032 Chronic diastolic (congestive) heart failure: Secondary | ICD-10-CM | POA: Diagnosis not present

## 2023-05-05 DIAGNOSIS — K529 Noninfective gastroenteritis and colitis, unspecified: Secondary | ICD-10-CM | POA: Diagnosis not present

## 2023-05-05 NOTE — Transitions of Care (Post Inpatient/ED Visit) (Signed)
   05/05/2023  Name: Lisa [name] MRN: [id number] DOB: [birth year]  Today's TOC FU Call Status: Today's TOC FU Call Status:: Unsuccessful Call (2nd Attempt) Unsuccessful Call (1st Attempt) Date: 05/04/23 Unsuccessful Call (2nd Attempt) Date: 05/05/23  Attempted to reach the patient regarding the most recent Inpatient/ED visit.  Follow Up Plan: Additional outreach attempts will be made to reach the patient to complete the Transitions of Care (Post Inpatient/ED visit) call.   Signature Karena Addison, LPN [facility] Nurse Health Advisor Direct Dial [phone]

## 2023-05-06 DIAGNOSIS — E1140 Type 2 diabetes mellitus with diabetic neuropathy, unspecified: Secondary | ICD-10-CM | POA: Diagnosis not present

## 2023-05-06 DIAGNOSIS — N3281 Overactive bladder: Secondary | ICD-10-CM | POA: Diagnosis not present

## 2023-05-06 DIAGNOSIS — I110 Hypertensive heart disease with heart failure: Secondary | ICD-10-CM | POA: Diagnosis not present

## 2023-05-06 DIAGNOSIS — K529 Noninfective gastroenteritis and colitis, unspecified: Secondary | ICD-10-CM | POA: Diagnosis not present

## 2023-05-06 DIAGNOSIS — I5032 Chronic diastolic (congestive) heart failure: Secondary | ICD-10-CM | POA: Diagnosis not present

## 2023-05-06 DIAGNOSIS — I82622 Acute embolism and thrombosis of deep veins of left upper extremity: Secondary | ICD-10-CM | POA: Diagnosis not present

## 2023-05-07 DIAGNOSIS — K529 Noninfective gastroenteritis and colitis, unspecified: Secondary | ICD-10-CM | POA: Diagnosis not present

## 2023-05-07 DIAGNOSIS — I82622 Acute embolism and thrombosis of deep veins of left upper extremity: Secondary | ICD-10-CM | POA: Diagnosis not present

## 2023-05-07 DIAGNOSIS — I110 Hypertensive heart disease with heart failure: Secondary | ICD-10-CM | POA: Diagnosis not present

## 2023-05-07 DIAGNOSIS — E1140 Type 2 diabetes mellitus with diabetic neuropathy, unspecified: Secondary | ICD-10-CM | POA: Diagnosis not present

## 2023-05-07 DIAGNOSIS — N3281 Overactive bladder: Secondary | ICD-10-CM | POA: Diagnosis not present

## 2023-05-07 DIAGNOSIS — I5032 Chronic diastolic (congestive) heart failure: Secondary | ICD-10-CM | POA: Diagnosis not present

## 2023-05-08 DIAGNOSIS — I82622 Acute embolism and thrombosis of deep veins of left upper extremity: Secondary | ICD-10-CM | POA: Diagnosis not present

## 2023-05-08 DIAGNOSIS — I5032 Chronic diastolic (congestive) heart failure: Secondary | ICD-10-CM | POA: Diagnosis not present

## 2023-05-08 DIAGNOSIS — E1140 Type 2 diabetes mellitus with diabetic neuropathy, unspecified: Secondary | ICD-10-CM | POA: Diagnosis not present

## 2023-05-08 DIAGNOSIS — K529 Noninfective gastroenteritis and colitis, unspecified: Secondary | ICD-10-CM | POA: Diagnosis not present

## 2023-05-08 DIAGNOSIS — N3281 Overactive bladder: Secondary | ICD-10-CM | POA: Diagnosis not present

## 2023-05-08 DIAGNOSIS — I110 Hypertensive heart disease with heart failure: Secondary | ICD-10-CM | POA: Diagnosis not present

## 2023-05-12 NOTE — Transitions of Care (Post Inpatient/ED Visit) (Signed)
   05/12/2023  Name: Laynah Cliver MRN: [id number] DOB: [birth year]  Today's TOC FU Call Status: Today's TOC FU Call Status:: Unsuccessful Call (3rd Attempt) Unsuccessful Call (1st Attempt) Date: 05/04/23 Unsuccessful Call (2nd Attempt) Date: 05/05/23 Unsuccessful Call (3rd Attempt) Date: 05/12/23  Attempted to reach the patient regarding the most recent Inpatient/ED visit.  Follow Up Plan: No further outreach attempts will be made at this time. We have been unable to contact the patient.  Signature Karena Addison, LPN [facility] Nurse Health Advisor Direct Dial [phone]

## 2023-05-14 DIAGNOSIS — I110 Hypertensive heart disease with heart failure: Secondary | ICD-10-CM | POA: Diagnosis not present

## 2023-05-14 DIAGNOSIS — K529 Noninfective gastroenteritis and colitis, unspecified: Secondary | ICD-10-CM | POA: Diagnosis not present

## 2023-05-14 DIAGNOSIS — I5032 Chronic diastolic (congestive) heart failure: Secondary | ICD-10-CM | POA: Diagnosis not present

## 2023-05-14 DIAGNOSIS — N3281 Overactive bladder: Secondary | ICD-10-CM | POA: Diagnosis not present

## 2023-05-14 DIAGNOSIS — E1140 Type 2 diabetes mellitus with diabetic neuropathy, unspecified: Secondary | ICD-10-CM | POA: Diagnosis not present

## 2023-05-14 DIAGNOSIS — I82622 Acute embolism and thrombosis of deep veins of left upper extremity: Secondary | ICD-10-CM | POA: Diagnosis not present

## 2023-05-15 DIAGNOSIS — I82622 Acute embolism and thrombosis of deep veins of left upper extremity: Secondary | ICD-10-CM | POA: Diagnosis not present

## 2023-05-15 DIAGNOSIS — K529 Noninfective gastroenteritis and colitis, unspecified: Secondary | ICD-10-CM | POA: Diagnosis not present

## 2023-05-15 DIAGNOSIS — N3281 Overactive bladder: Secondary | ICD-10-CM | POA: Diagnosis not present

## 2023-05-15 DIAGNOSIS — I5032 Chronic diastolic (congestive) heart failure: Secondary | ICD-10-CM | POA: Diagnosis not present

## 2023-05-15 DIAGNOSIS — I110 Hypertensive heart disease with heart failure: Secondary | ICD-10-CM | POA: Diagnosis not present

## 2023-05-15 DIAGNOSIS — E1140 Type 2 diabetes mellitus with diabetic neuropathy, unspecified: Secondary | ICD-10-CM | POA: Diagnosis not present

## 2023-05-16 DIAGNOSIS — N3281 Overactive bladder: Secondary | ICD-10-CM | POA: Diagnosis not present

## 2023-05-16 DIAGNOSIS — K529 Noninfective gastroenteritis and colitis, unspecified: Secondary | ICD-10-CM | POA: Diagnosis not present

## 2023-05-16 DIAGNOSIS — I5032 Chronic diastolic (congestive) heart failure: Secondary | ICD-10-CM | POA: Diagnosis not present

## 2023-05-16 DIAGNOSIS — I82622 Acute embolism and thrombosis of deep veins of left upper extremity: Secondary | ICD-10-CM | POA: Diagnosis not present

## 2023-05-16 DIAGNOSIS — E1140 Type 2 diabetes mellitus with diabetic neuropathy, unspecified: Secondary | ICD-10-CM | POA: Diagnosis not present

## 2023-05-16 DIAGNOSIS — I110 Hypertensive heart disease with heart failure: Secondary | ICD-10-CM | POA: Diagnosis not present

## 2023-05-21 ENCOUNTER — Ambulatory Visit: Admission: RE | Admit: 2023-05-21 | Payer: Medicare Other | Source: Ambulatory Visit

## 2023-05-22 DIAGNOSIS — I82622 Acute embolism and thrombosis of deep veins of left upper extremity: Secondary | ICD-10-CM | POA: Diagnosis not present

## 2023-05-22 DIAGNOSIS — H54415A Blindness right eye category 5, normal vision left eye: Secondary | ICD-10-CM | POA: Diagnosis not present

## 2023-05-22 DIAGNOSIS — N3281 Overactive bladder: Secondary | ICD-10-CM | POA: Diagnosis not present

## 2023-05-22 DIAGNOSIS — E876 Hypokalemia: Secondary | ICD-10-CM | POA: Diagnosis not present

## 2023-05-22 DIAGNOSIS — E1140 Type 2 diabetes mellitus with diabetic neuropathy, unspecified: Secondary | ICD-10-CM | POA: Diagnosis not present

## 2023-05-22 DIAGNOSIS — I110 Hypertensive heart disease with heart failure: Secondary | ICD-10-CM | POA: Diagnosis not present

## 2023-05-22 DIAGNOSIS — E440 Moderate protein-calorie malnutrition: Secondary | ICD-10-CM | POA: Diagnosis not present

## 2023-05-22 DIAGNOSIS — H409 Unspecified glaucoma: Secondary | ICD-10-CM | POA: Diagnosis not present

## 2023-05-22 DIAGNOSIS — I5032 Chronic diastolic (congestive) heart failure: Secondary | ICD-10-CM | POA: Diagnosis not present

## 2023-05-22 DIAGNOSIS — K529 Noninfective gastroenteritis and colitis, unspecified: Secondary | ICD-10-CM | POA: Diagnosis not present

## 2023-05-22 DIAGNOSIS — F32A Depression, unspecified: Secondary | ICD-10-CM | POA: Diagnosis not present

## 2023-05-22 DIAGNOSIS — F17211 Nicotine dependence, cigarettes, in remission: Secondary | ICD-10-CM | POA: Diagnosis not present

## 2023-05-23 DIAGNOSIS — K529 Noninfective gastroenteritis and colitis, unspecified: Secondary | ICD-10-CM | POA: Diagnosis not present

## 2023-05-23 DIAGNOSIS — I110 Hypertensive heart disease with heart failure: Secondary | ICD-10-CM | POA: Diagnosis not present

## 2023-05-23 DIAGNOSIS — I82622 Acute embolism and thrombosis of deep veins of left upper extremity: Secondary | ICD-10-CM | POA: Diagnosis not present

## 2023-05-23 DIAGNOSIS — I5032 Chronic diastolic (congestive) heart failure: Secondary | ICD-10-CM | POA: Diagnosis not present

## 2023-05-23 DIAGNOSIS — E1140 Type 2 diabetes mellitus with diabetic neuropathy, unspecified: Secondary | ICD-10-CM | POA: Diagnosis not present

## 2023-05-23 DIAGNOSIS — N3281 Overactive bladder: Secondary | ICD-10-CM | POA: Diagnosis not present

## 2023-05-28 ENCOUNTER — Telehealth: Payer: Self-pay | Admitting: Family

## 2023-05-28 ENCOUNTER — Encounter: Payer: Medicare Other | Admitting: Family

## 2023-05-28 DIAGNOSIS — N3281 Overactive bladder: Secondary | ICD-10-CM | POA: Diagnosis not present

## 2023-05-28 DIAGNOSIS — E1140 Type 2 diabetes mellitus with diabetic neuropathy, unspecified: Secondary | ICD-10-CM | POA: Diagnosis not present

## 2023-05-28 DIAGNOSIS — K529 Noninfective gastroenteritis and colitis, unspecified: Secondary | ICD-10-CM | POA: Diagnosis not present

## 2023-05-28 DIAGNOSIS — I5032 Chronic diastolic (congestive) heart failure: Secondary | ICD-10-CM | POA: Diagnosis not present

## 2023-05-28 DIAGNOSIS — I82622 Acute embolism and thrombosis of deep veins of left upper extremity: Secondary | ICD-10-CM | POA: Diagnosis not present

## 2023-05-28 DIAGNOSIS — I110 Hypertensive heart disease with heart failure: Secondary | ICD-10-CM | POA: Diagnosis not present

## 2023-05-28 NOTE — Progress Notes (Deleted)
 Advanced Heart Failure Clinic Note   Referring Physician: recent admit PCP: Sowles, Krichna, MD (last seen 09/24) Cardiologist: None    Lisa [name] is a 78 y/o female with a history of DM, hyperlipidemia, HTN, depression, DVT, right eye blindness, vascular disease, tobacco use, malnutrition and chronic heart failure.   Admitted 04/09/21 due to shortness of breath and cough due to acute on chronic heart failure. Initially placed on bipap but then able to be weaned down to 5L nasal cannula. Cardiology consult obtained. Given IV lasix  with transition to oral diuretics. Patient declined cardiac cath. Discharged after 4 days.   Admitted 04/07/23 due to progressive nausea, weakness, poor p.o. tolerance, persistent loose stools. K+ & Mg replaced. Started initially on intravenous heparin  however given that the DVT is nonocclusive and upper extremity and superficial thrombophlebitis does not require anticoagulation will discontinue. Dietician consult made.   Echo 02/26/21: EF 60-65% along with mild LVH.   She presents today for a HF follow-up visit, although hasn't been seen in 2 years with a chief complaint of minimal shortness of breath with moderate exertion. Chronic in nature. Has associated fatigue, intermittent palpitations, looser stools (better) and urinary incontinence (better) along with this. Denies chest pain, cough, dizziness or difficulty sleeping. Sleeping well on 1-2 pillows.  Reports a good appetite. Has decreased salt usage. Currently receiving PT at [facility].   Says that she fell ~ 4 days ago in the bathroom at the facility and landed on her side. Says that she's been xrayed because she was having left sided rib pain which is still present, although better.    She voices frustration with all the medications that she takes and her concern about being able to manage this once she returns home. Her vision continues to deteriorate and she is blind in her right eye.   ROS: All  systems negative except as listed in HPI, PMH and Problem List.  SH:  Social History   Socioeconomic History   Marital status: Widowed    Spouse name: Not on file   Number of children: 0   Years of education: Not on file   Highest education level: Some college, no degree  Occupational History   Occupation: Retired  Tobacco Use   Smoking status: Every Day    Current packs/day: 0.25    Average packs/day: 0.3 packs/day for 40.3 years (10.1 ttl pk-yrs)    Types: Cigarettes    Start date: 03/16/2000   Smokeless tobacco: Never  Vaping Use   Vaping status: Never Used  Substance and Sexual Activity   Alcohol  use: Yes    Comment: Wine Ocassionally   Drug use: No   Sexual activity: Not Currently  Other Topics Concern   Not on file  Social History Narrative   Not on file   Social Drivers of Health   Financial Resource Strain: Medium Risk (04/08/2023)   Overall Financial Resource Strain (CARDIA)    Difficulty of Paying Living Expenses: Somewhat hard  Food Insecurity: No Food Insecurity (04/08/2023)   Hunger Vital Sign    Worried About Running Out of Food in the Last Year: Never true    Ran Out of Food in the Last Year: Never true  Transportation Needs: No Transportation Needs (04/08/2023)   PRAPARE - Administrator, Civil Service (Medical): No    Lack of Transportation (Non-Medical): No  Physical Activity: Insufficiently Active (03/26/2021)   Exercise Vital Sign    Days of Exercise per Week: 3  days    Minutes of Exercise per Session: 20 min  Stress: No Stress Concern Present (03/26/2021)   [employer] of Occupational Health - Occupational Stress Questionnaire    Feeling of Stress : Only a little  Social Connections: Socially Isolated (03/19/2022)   Social Connection and Isolation Panel [NHANES]    Frequency of Communication with Friends and Family: More than three times a week    Frequency of Social Gatherings with Friends and Family: Once a week    Attends  Religious Services: Never    Database Administrator or Organizations: No    Attends Banker Meetings: Never    Marital Status: Widowed  Intimate Partner Violence: Not At Risk (04/08/2023)   Humiliation, Afraid, Rape, and Kick questionnaire    Fear of Current or Ex-Partner: No    Emotionally Abused: No    Physically Abused: No    Sexually Abused: No    FH:  Family History  Problem Relation Age of Onset   Heart disease Father    Diabetes Mother    Diabetes Brother    Kidney disease Brother        Transplant   Heart disease Brother    Diabetes Sister    Diabetes Sister     Past Medical History:  Diagnosis Date   Blindness of right eye    CHF (congestive heart failure) (HCC)    Depression    Diabetes mellitus without complication (HCC)    DVT of leg (deep venous thrombosis) (HCC) 2011   Glaucoma    Grade II diastolic dysfunction    Hyperlipidemia    Hypertension    Vascular disease     Current Outpatient Medications  Medication Sig Dispense Refill   acetaminophen  (TYLENOL ) 500 MG tablet Take 2 tablets by mouth every 6 (six) hours as needed.     amLODipine  (NORVASC ) 10 MG tablet Take 1 tablet (10 mg total) by mouth daily.     aspirin  81 MG tablet Take 81 mg by mouth daily.     atropine  1 % ophthalmic solution Place 1 drop into the left eye 3 (three) times daily.     brimonidine  (ALPHAGAN ) 0.2 % ophthalmic solution Place 1 drop into both eyes 2 (two) times daily.  3   citalopram  (CELEXA ) 10 MG tablet Take 1 tablet (10 mg total) by mouth daily. 90 tablet 1   clopidogrel  (PLAVIX ) 75 MG tablet Take 1 tablet (75 mg total) by mouth daily. 90 tablet 1   Cyanocobalamin  (B-12) 1000 MCG SUBL Place 1 tablet under the tongue daily. 90 tablet 1   dapagliflozin  propanediol (FARXIGA ) 10 MG TABS tablet TAKE 1 TABLET BY MOUTH EVERY DAY BEFORE BREAKFAST 90 tablet 1   dorzolamide -timolol  (COSOPT ) 22.3-6.8 MG/ML ophthalmic solution Place 1 drop into both eyes 2 (two) times  daily.     feeding supplement (ENSURE ENLIVE / ENSURE PLUS) LIQD Take 237 mLs by mouth 2 (two) times daily between meals.     hydrALAZINE  (APRESOLINE ) 50 MG tablet Take 1 tablet (50 mg total) by mouth every 8 (eight) hours.     losartan  (COZAAR ) 100 MG tablet Take 1 tablet (100 mg total) by mouth daily.     metoprolol  succinate (TOPROL  XL) 25 MG 24 hr tablet Take 1 tablet (25 mg total) by mouth daily. Take with or immediately following a meal. 90 tablet 1   Multiple Vitamin (MULTIVITAMIN) tablet Take 1 tablet by mouth daily.     nitroGLYCERIN  (NITROSTAT ) 0.4 MG SL  tablet Place 1 tablet (0.4 mg total) under the tongue every 5 (five) minutes as needed for chest pain. 15 tablet 12   polyvinyl alcohol  (LIQUIFILM TEARS) 1.4 % ophthalmic solution Place 2 drops into the right eye as needed for dry eyes.     prednisoLONE  acetate (PRED FORTE ) 1 % ophthalmic suspension Place 1 drop into the left eye 4 (four) times daily.     psyllium (HYDROCIL/METAMUCIL) 95 % PACK Take 1 packet by mouth daily.     rosuvastatin  (CRESTOR ) 10 MG tablet Take 1 tablet (10 mg total) by mouth daily. 90 tablet 1   sitaGLIPtin  (JANUVIA ) 100 MG tablet Take 1 tablet (100 mg total) by mouth daily. 90 tablet 1   Vibegron  (GEMTESA ) 75 MG TABS Take 75 mg by mouth daily. 30 tablet 11   Vitamin D , Ergocalciferol , (DRISDOL ) 1.25 MG (50000 UNIT) CAPS capsule TAKE 1 CAPSULE (50,000 UNITS TOTAL) BY MOUTH EVERY 7 (SEVEN) DAYS 8 capsule 1   No current facility-administered medications for this visit.   There were no vitals filed for this visit.  Wt Readings from Last 3 Encounters:  04/20/23 114 lb (51.7 kg)  02/25/23 118 lb (53.5 kg)  02/23/23 123 lb 4.8 oz (55.9 kg)   Lab Results  Component Value Date   CREATININE 0.59 04/12/2023   CREATININE 0.64 04/11/2023   CREATININE 0.75 04/10/2023   PHYSICAL EXAM:  General:  Well appearing. No resp difficulty HEENT: normal Neck: supple. JVP flat. No lymphadenopathy or thryomegaly  appreciated. Cor: PMI normal. Regular rate & rhythm. No rubs, gallops or murmurs. Lungs: clear Abdomen: soft, nontender, nondistended. No hepatosplenomegaly. No bruits or masses.  Extremities: no cyanosis, clubbing, rash, edema Neuro: alert & oriented x3, cranial nerves grossly intact. Moves all 4 extremities w/o difficulty. Affect pleasant.   ECG: not done   ASSESSMENT & PLAN:    1: NICM with preserved ejection fraction- - suspect due to HTN - NYHA II - euvolemic today - weighing daily; instructed to call for an overnight weight gain of > 2 pounds or a weekly weight gain of > 5 pounds - Echo 02/26/21: EF 60-65% along with mild LVH.  - will get echo updated - continue farxiga  10mg  daily - continue losartan  100mg  daily - continue metoprolol  succinate 25mg  daily - discussed adding spironolactone but she would like to defer this for now because of all of the medications that she currently is taking; explained that if we started this medication, we might be able to stop amlodipine  or hydralazine  - she may benefit from medication bubble packaging once she returns home since she has had such vision loss - currently getting PT at SNF; had a fall ~ 4 days ago in the facility and says that she's had rib xrays completed - BNP 04/07/23 was 840.1  2: HTN- - BP 143/63 - continue amlodipine  10mg  daily - continue hydralazine  50mg  TID - saw PCP Lisa [name]) 09/24 - BMP 04/12/23 reviewed and showed sodium 137, potassium 4.8, creatinine 0.59 and GFR >60  3: DM- - A1c 01/30/23 was 6.4% - continue januvia  100mg  daily - continue farxiga  10mg  daily  4: Depression- - under control at this time - currently taking citalopram  10mg  daily  5: DVT of left upper extremity- - no need for anticoagulation is DVT is nonocclusive - continue plavix  75mg  daily - resolved; no further edema of left arm  6: Dyslipidemia- - continue rosuvastatin  10mg  daily - LDL 02/03/23 was52  Return 1 week after echo.

## 2023-05-28 NOTE — Telephone Encounter (Signed)
 Patient did not show for her Heart Failure Clinic appointment on 05/28/23.

## 2023-06-01 DIAGNOSIS — Z7902 Long term (current) use of antithrombotics/antiplatelets: Secondary | ICD-10-CM | POA: Diagnosis not present

## 2023-06-01 DIAGNOSIS — H409 Unspecified glaucoma: Secondary | ICD-10-CM | POA: Diagnosis not present

## 2023-06-01 DIAGNOSIS — E876 Hypokalemia: Secondary | ICD-10-CM | POA: Diagnosis not present

## 2023-06-01 DIAGNOSIS — Z7982 Long term (current) use of aspirin: Secondary | ICD-10-CM | POA: Diagnosis not present

## 2023-06-01 DIAGNOSIS — K529 Noninfective gastroenteritis and colitis, unspecified: Secondary | ICD-10-CM | POA: Diagnosis not present

## 2023-06-01 DIAGNOSIS — I110 Hypertensive heart disease with heart failure: Secondary | ICD-10-CM | POA: Diagnosis not present

## 2023-06-01 DIAGNOSIS — E1140 Type 2 diabetes mellitus with diabetic neuropathy, unspecified: Secondary | ICD-10-CM | POA: Diagnosis not present

## 2023-06-01 DIAGNOSIS — E559 Vitamin D deficiency, unspecified: Secondary | ICD-10-CM | POA: Diagnosis not present

## 2023-06-01 DIAGNOSIS — Z9181 History of falling: Secondary | ICD-10-CM | POA: Diagnosis not present

## 2023-06-01 DIAGNOSIS — E440 Moderate protein-calorie malnutrition: Secondary | ICD-10-CM | POA: Diagnosis not present

## 2023-06-01 DIAGNOSIS — I82622 Acute embolism and thrombosis of deep veins of left upper extremity: Secondary | ICD-10-CM | POA: Diagnosis not present

## 2023-06-01 DIAGNOSIS — Z89422 Acquired absence of other left toe(s): Secondary | ICD-10-CM | POA: Diagnosis not present

## 2023-06-01 DIAGNOSIS — F17211 Nicotine dependence, cigarettes, in remission: Secondary | ICD-10-CM | POA: Diagnosis not present

## 2023-06-01 DIAGNOSIS — H54415A Blindness right eye category 5, normal vision left eye: Secondary | ICD-10-CM | POA: Diagnosis not present

## 2023-06-01 DIAGNOSIS — F32A Depression, unspecified: Secondary | ICD-10-CM | POA: Diagnosis not present

## 2023-06-01 DIAGNOSIS — N3281 Overactive bladder: Secondary | ICD-10-CM | POA: Diagnosis not present

## 2023-06-01 DIAGNOSIS — Z7984 Long term (current) use of oral hypoglycemic drugs: Secondary | ICD-10-CM | POA: Diagnosis not present

## 2023-06-01 DIAGNOSIS — E782 Mixed hyperlipidemia: Secondary | ICD-10-CM | POA: Diagnosis not present

## 2023-06-01 DIAGNOSIS — I5032 Chronic diastolic (congestive) heart failure: Secondary | ICD-10-CM | POA: Diagnosis not present

## 2023-06-04 DIAGNOSIS — I110 Hypertensive heart disease with heart failure: Secondary | ICD-10-CM | POA: Diagnosis not present

## 2023-06-04 DIAGNOSIS — I5032 Chronic diastolic (congestive) heart failure: Secondary | ICD-10-CM | POA: Diagnosis not present

## 2023-06-04 DIAGNOSIS — E1140 Type 2 diabetes mellitus with diabetic neuropathy, unspecified: Secondary | ICD-10-CM | POA: Diagnosis not present

## 2023-06-04 DIAGNOSIS — I82622 Acute embolism and thrombosis of deep veins of left upper extremity: Secondary | ICD-10-CM | POA: Diagnosis not present

## 2023-06-04 DIAGNOSIS — N3281 Overactive bladder: Secondary | ICD-10-CM | POA: Diagnosis not present

## 2023-06-04 DIAGNOSIS — K529 Noninfective gastroenteritis and colitis, unspecified: Secondary | ICD-10-CM | POA: Diagnosis not present

## 2023-06-25 DIAGNOSIS — I110 Hypertensive heart disease with heart failure: Secondary | ICD-10-CM | POA: Diagnosis not present

## 2023-06-25 DIAGNOSIS — N3281 Overactive bladder: Secondary | ICD-10-CM | POA: Diagnosis not present

## 2023-06-25 DIAGNOSIS — K529 Noninfective gastroenteritis and colitis, unspecified: Secondary | ICD-10-CM | POA: Diagnosis not present

## 2023-06-25 DIAGNOSIS — E1140 Type 2 diabetes mellitus with diabetic neuropathy, unspecified: Secondary | ICD-10-CM | POA: Diagnosis not present

## 2023-06-25 DIAGNOSIS — I5032 Chronic diastolic (congestive) heart failure: Secondary | ICD-10-CM | POA: Diagnosis not present

## 2023-06-25 DIAGNOSIS — I82622 Acute embolism and thrombosis of deep veins of left upper extremity: Secondary | ICD-10-CM | POA: Diagnosis not present

## 2023-07-03 ENCOUNTER — Other Ambulatory Visit: Payer: Self-pay | Admitting: Family Medicine

## 2023-07-06 NOTE — Telephone Encounter (Signed)
 Requested medication (s) are due for refill today: yes  Requested medication (s) are on the active medication list: no  Last refill:  04/15/23 (amlodipine )     01/29/16 (amlodipine )  Future visit scheduled: yes  Notes to clinic:  neither med prescribed by PCP (were prescribed at hospital d/c) neither med assigned to a protcol0   Requested Prescriptions  Pending Prescriptions Disp Refills   amLODipine  (NORVASC ) 10 MG tablet [Pharmacy Med Name: AMLODIPINE  BESYLATE 10 MG TAB] 30 tablet     Sig: TAKE 1 TABLET BY MOUTH ONE TIME A DAY.     There is no refill protocol information for this order     atropine  1 % ophthalmic solution [Pharmacy Med Name: ATROPINE  1% EYE DROPS] 5 mL     Sig: INSTILL 1 DROP IN LEFT EYE THREE TIMES A DAY FOR GLAUCOMA.     There is no refill protocol information for this order

## 2023-07-06 NOTE — Telephone Encounter (Signed)
 Historical provider.

## 2023-07-31 ENCOUNTER — Ambulatory Visit: Payer: Medicare Other | Admitting: Family Medicine

## 2023-08-05 ENCOUNTER — Other Ambulatory Visit: Payer: Self-pay | Admitting: Family Medicine

## 2023-08-17 ENCOUNTER — Ambulatory Visit (INDEPENDENT_AMBULATORY_CARE_PROVIDER_SITE_OTHER): Admitting: Internal Medicine

## 2023-08-17 ENCOUNTER — Ambulatory Visit
Admission: RE | Admit: 2023-08-17 | Discharge: 2023-08-17 | Disposition: A | Discharge status: Home / Self Care | Attending: Internal Medicine | Admitting: Internal Medicine

## 2023-08-17 ENCOUNTER — Encounter: Payer: Self-pay | Admitting: Internal Medicine

## 2023-08-17 ENCOUNTER — Ambulatory Visit
Admission: RE | Admit: 2023-08-17 | Discharge: 2023-08-17 | Disposition: A | Discharge status: Home / Self Care | Source: Ambulatory Visit | Attending: Internal Medicine

## 2023-08-17 ENCOUNTER — Ambulatory Visit: Payer: Self-pay

## 2023-08-17 VITALS — BP 126/72 | Resp 18 | Ht 64.0 in | Wt 117.6 lb

## 2023-08-17 DIAGNOSIS — M79672 Pain in left foot: Secondary | ICD-10-CM

## 2023-08-17 DIAGNOSIS — M25572 Pain in left ankle and joints of left foot: Secondary | ICD-10-CM | POA: Diagnosis not present

## 2023-08-17 DIAGNOSIS — Z89422 Acquired absence of other left toe(s): Secondary | ICD-10-CM | POA: Diagnosis not present

## 2023-08-17 NOTE — Progress Notes (Signed)
   Acute Office Visit  Subjective:     [name], female    DOB: 1945/07/17, 78 y.o.   MRN: [id number]  Chief Complaint  Patient presents with   Foot Pain    left    Foot Pain Pertinent negatives include no chills, fever or rash.   Patient is in today for left foot pain. 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 She has an appointment with her vascular provider next week. She has pain with weight bearing but can have it non-weight bearing as well. Denies trauma to the foot. Mild swelling, no erythema, warmth or drainage. No fevers. She is taking Tylenol 650 mg once daily which decreases pain mildly.   Review of Systems  Constitutional:  Negative for chills and fever.  Skin:  Negative for rash.        Objective:    BP 126/72   Resp 18   Ht 5\' 4"  (1.626 m)   Wt 117 lb 9.6 oz (53.3 kg)   BMI 20.19 kg/m    Physical Exam Constitutional:      Appearance: Normal appearance.  HENT:     Head: Normocephalic and atraumatic.  Eyes:     Conjunctiva/sclera: Conjunctivae normal.  Cardiovascular:     Rate and Rhythm: Normal rate and regular rhythm.  Pulmonary:     Effort: Pulmonary effort is normal.     Breath sounds: Normal breath sounds.  Skin:    General: Skin is warm and dry.     Comments: S/p distal digit amputation with pulses present, mild swelling but no skin changes, warmth, erythema, etc. No wounds present.   Neurological:     General: No focal deficit present.     Mental Status: She is alert. Mental status is at baseline.  Psychiatric:        Mood and Affect: Mood normal.        Behavior: Behavior normal.     No results found for any visits on 08/17/23.      Assessment & Plan:   1. Left foot pain (Primary): No obvious signs of infection, denies trauma. Keep vascular appointment, will obtain an x-ray today. Discussed starting  something like Gabapentin for pain but patient declines stating that she does not want to take more medication. Can continue Tylenol as needed for pain.   - DG Foot Complete Left; Future   Return if symptoms worsen or fail to improve.  Margarita Mail, DO

## 2023-08-17 NOTE — Telephone Encounter (Signed)
 Copied from CRM [id number]. Topic: Clinical - Red Word Triage >> Aug 17, 2023 10:52 AM Marland Kitchen D wrote: Amputee on left foot all toes gone in pain over a week. Patient has a sore on it. Patient is worried she may loose whole foot.  Chief Complaint: foot pain Symptoms: left foot pain - severe Frequency: 1 week Pertinent Negatives: Patient denies fever Disposition: [] ED /[] Urgent Care (no appt availability in office) / [x] Appointment(In office/virtual)/ []  [facility]/ [] Home Care/ [] Refused Recommended Disposition /[] [facility]/ []  Follow-up with PCP Additional Notes: left foot was amputated years ago: stated been having pain for about a week.  Yesterday while washing foot with soap and water she noticed a wound/sore with drainage- clear.  Pt c/o due to feeling ill. C/o area being swollen, tingling, numb and weak at times.  Reason for Disposition  Numbness in a leg or foot (i.e., loss of sensation)    Pt c/o severe pain to left foot for over week: pain in area where had toes amputated. Yesterday found wound/sore.  Would like it to be evaluated.  Answer Assessment - Initial Assessment Questions 1. ONSET: "When did the pain start?"      1 week 2. LOCATION: "Where is the pain located?"      Left foot 3. PAIN: "How bad is the pain?"    (Scale 1-10; or mild, moderate, severe)   -  MILD (1-3): doesn't interfere with normal activities    -  MODERATE (4-7): interferes with normal activities (e.g., work or school) or awakens from sleep, limping    -  SEVERE (8-10): excruciating pain, unable to do any normal activities, unable to walk     severe 4. WORK OR EXERCISE: "Has there been any recent work or exercise that involved this part of the body?"      no 5. CAUSE: "What do you think is causing the leg pain?"     Wound/sore 6. OTHER SYMPTOMS: "Do you have any other symptoms?" (e.g., chest pain, back pain, breathing difficulty, swelling, rash, fever, numbness, weakness)      Swelling around wound/sore, numbness, tingling & weakness 7. PREGNANCY: "Is there any chance you are pregnant?" "When was your last menstrual period?"     N/a  Protocols used: Leg Pain-A-AH

## 2023-08-17 NOTE — Patient Instructions (Addendum)
 It was great seeing you today!  Plan discussed at today's visit: -Please obtain x-ray today -Can Tylenol 325-650 mg every 4-6 hours as needed for pain, do not exceed 3,000 mg daily -Keep appointment with Dr. Wyn Quaker  Follow up in: as needed  Take care and let us know if you have any questions or concerns prior to your next visit.  Dr. Caralee Ates

## 2023-08-25 ENCOUNTER — Other Ambulatory Visit: Payer: Self-pay | Admitting: Vascular Surgery

## 2023-08-25 ENCOUNTER — Encounter (INDEPENDENT_AMBULATORY_CARE_PROVIDER_SITE_OTHER): Payer: Self-pay | Admitting: Vascular Surgery

## 2023-08-25 ENCOUNTER — Ambulatory Visit (INDEPENDENT_AMBULATORY_CARE_PROVIDER_SITE_OTHER): Admitting: Vascular Surgery

## 2023-08-25 ENCOUNTER — Telehealth (INDEPENDENT_AMBULATORY_CARE_PROVIDER_SITE_OTHER): Payer: Self-pay

## 2023-08-25 ENCOUNTER — Other Ambulatory Visit (INDEPENDENT_AMBULATORY_CARE_PROVIDER_SITE_OTHER)

## 2023-08-25 VITALS — BP 232/113 | HR 71 | Resp 16

## 2023-08-25 DIAGNOSIS — E1151 Type 2 diabetes mellitus with diabetic peripheral angiopathy without gangrene: Secondary | ICD-10-CM

## 2023-08-25 DIAGNOSIS — I10 Essential (primary) hypertension: Secondary | ICD-10-CM

## 2023-08-25 DIAGNOSIS — I70222 Atherosclerosis of native arteries of extremities with rest pain, left leg: Secondary | ICD-10-CM

## 2023-08-25 DIAGNOSIS — I779 Disorder of arteries and arterioles, unspecified: Secondary | ICD-10-CM

## 2023-08-25 DIAGNOSIS — E785 Hyperlipidemia, unspecified: Secondary | ICD-10-CM

## 2023-08-25 MED ORDER — TRAMADOL HCL 50 MG PO TABS: 50.0000 mg | ORAL_TABLET | Freq: Four times a day (QID) | ORAL | 0 refills | Status: DC | PRN

## 2023-08-25 NOTE — Assessment & Plan Note (Signed)
 blood glucose control important in reducing the progression of atherosclerotic disease. Also, involved in wound healing. On appropriate medications.

## 2023-08-25 NOTE — Assessment & Plan Note (Signed)
 lipid control important in reducing the progression of atherosclerotic disease. Continue statin therapy

## 2023-08-25 NOTE — Assessment & Plan Note (Signed)
 blood pressure control important in reducing the progression of atherosclerotic disease. On appropriate oral medications.

## 2023-08-25 NOTE — H&P (View-Only) (Signed)
 [name], female   DOB: 1945-10-22, 78 y.o.   MRN: [id number]  Chief Complaint  Patient presents with   Follow-up    Pain in little toe on left foot    HPI Lisa [name] is a 78 y.o. female.  I am asked to see the patient by Dr. Carlynn Purl for evaluation of her PAD.  She is a known patient to [place] but has not been seen in the office in over 2 years.  She has developed debilitating pain in her left foot over the past few months.  There is been no trauma, injury, or inciting event.  The foot has become cooler and more dark.  She denies any thing that makes it better.  She does try to dangle her leg.  Her walking has become extremely limited as well.  She still is not having much pain in her right leg.  No open sores although she does have a couple of areas that appear to be impending ulcerations on the left lateral forefoot.  Her previous transmetatarsal amputation remains healed. ABIs today are 0.54 on the right 0.58 on the left.  She has had previous transmetatarsal amputation on the left with digit pressures are not available.  Her waveforms are monophasic and not particularly brisk.     Past Medical History:  Diagnosis Date   Blindness of right eye    CHF (congestive heart failure) (HCC)    Depression    Diabetes mellitus without complication (HCC)    DVT of leg (deep venous thrombosis) (HCC) 2011   Glaucoma    Grade II diastolic dysfunction    Hyperlipidemia    Hypertension    Vascular disease     Past Surgical History:  Procedure Laterality Date   ABDOMINAL HYSTERECTOMY  1992   Total   CATARACT EXTRACTION W/PHACO Left 02/25/2023   Procedure: CATARACT EXTRACTION PHACO AND INTRAOCULAR LENS PLACEMENT (IOC) LEFT DIABETIC  malyugin;  Surgeon: Lockie Mola, MD;  Location: [facility];  Service: Ophthalmology;  Laterality: Left;  10.78 0:49.1   EYE SURGERY     TOE AMPUTATION Left 2011   All of her toes on the left foot   VASCULAR SURGERY        Family History  Problem Relation Age of Onset   Heart disease Father    Diabetes Mother    Diabetes Brother    Kidney disease Brother        Transplant   Heart disease Brother    Diabetes Sister    Diabetes Sister      Social History   Tobacco Use   Smoking status: Every Day    Current packs/day: 0.25    Average packs/day: 0.3 packs/day for 40.5 years (10.1 ttl pk-yrs)    Types: Cigarettes    Start date: 03/16/2000   Smokeless tobacco: Never  Vaping Use   Vaping status: Never Used  Substance Use Topics   Alcohol use: Yes    Comment: Wine Ocassionally   Drug use: No     Allergies  Allergen Reactions   Other Itching    Nucinta - Hallucinations   Tapentadol Hcl Itching    Hallucinations   Actos [Pioglitazone]     History of CHF   Penicillins Rash    Current Outpatient Medications  Medication Sig Dispense Refill   acetaminophen (TYLENOL) 500 MG tablet Take 2 tablets by mouth every 6 (six) hours as needed.     amLODipine (NORVASC) 10 MG  tablet TAKE 1 TABLET BY MOUTH ONE TIME A DAY. 30 tablet 0   aspirin 81 MG tablet Take 81 mg by mouth daily.     atropine 1 % ophthalmic solution Place 1 drop into the left eye 3 (three) times daily.     brimonidine (ALPHAGAN) 0.2 % ophthalmic solution Place 1 drop into both eyes 2 (two) times daily.  3   citalopram (CELEXA) 10 MG tablet Take 1 tablet (10 mg total) by mouth daily. 90 tablet 1   clopidogrel (PLAVIX) 75 MG tablet Take 1 tablet (75 mg total) by mouth daily. 90 tablet 1   Cyanocobalamin (B-12) 1000 MCG SUBL Place 1 tablet under the tongue daily. 90 tablet 1   dapagliflozin propanediol (FARXIGA) 10 MG TABS tablet TAKE 1 TABLET BY MOUTH EVERY DAY BEFORE BREAKFAST 90 tablet 1   dorzolamide-timolol (COSOPT) 22.3-6.8 MG/ML ophthalmic solution Place 1 drop into both eyes 2 (two) times daily.     feeding supplement (ENSURE ENLIVE / ENSURE PLUS) LIQD Take 237 mLs by mouth 2 (two) times daily between meals.     hydrALAZINE  (APRESOLINE) 50 MG tablet Take 1 tablet (50 mg total) by mouth every 8 (eight) hours.     losartan (COZAAR) 100 MG tablet Take 1 tablet (100 mg total) by mouth daily.     metoprolol succinate (TOPROL XL) 25 MG 24 hr tablet Take 1 tablet (25 mg total) by mouth daily. Take with or immediately following a meal. 90 tablet 1   Multiple Vitamin (MULTIVITAMIN) tablet Take 1 tablet by mouth daily.     nitroGLYCERIN (NITROSTAT) 0.4 MG SL tablet Place 1 tablet (0.4 mg total) under the tongue every 5 (five) minutes as needed for chest pain. 15 tablet 12   polyvinyl alcohol (LIQUIFILM TEARS) 1.4 % ophthalmic solution Place 2 drops into the right eye as needed for dry eyes.     prednisoLONE acetate (PRED FORTE) 1 % ophthalmic suspension Place 1 drop into the left eye 4 (four) times daily.     psyllium (HYDROCIL/METAMUCIL) 95 % PACK Take 1 packet by mouth daily.     rosuvastatin (CRESTOR) 10 MG tablet Take 1 tablet (10 mg total) by mouth daily. 90 tablet 1   sitaGLIPtin (JANUVIA) 100 MG tablet Take 1 tablet (100 mg total) by mouth daily. 90 tablet 1   traMADol (ULTRAM) 50 MG tablet Take 1 tablet (50 mg total) by mouth every 6 (six) hours as needed. 20 tablet 0   Vibegron (GEMTESA) 75 MG TABS Take 75 mg by mouth daily. 30 tablet 11   Vitamin D, Ergocalciferol, (DRISDOL) 1.25 MG (50000 UNIT) CAPS capsule TAKE 1 CAPSULE (50,000 UNITS TOTAL) BY MOUTH EVERY 7 (SEVEN) DAYS 8 capsule 1   No current facility-administered medications for this visit.      REVIEW OF SYSTEMS (Negative unless checked)   Constitutional: [] Weight loss  [] Fever  [] Chills Cardiac: [] Chest pain   [] Chest pressure   [] Palpitations   [] Shortness of breath when laying flat   [] Shortness of breath at rest   [] Shortness of breath with exertion. Vascular:  [x] Pain in legs with walking   [x] Pain in legs at rest   [] Pain in legs when laying flat   [] Claudication   [] Pain in feet when walking  [x] Pain in feet at rest  [] Pain in feet when laying flat    [] History of DVT   [] Phlebitis   [] Swelling in legs   [] Varicose veins   [] Non-healing ulcers Pulmonary:   [] Uses home oxygen   []   Productive cough   [] Hemoptysis   [] Wheeze  [] COPD   [] Asthma Neurologic:  [] Dizziness  [] Blackouts   [] Seizures   [] History of stroke   [] History of TIA  [] Aphasia   [] Temporary blindness   [] Dysphagia   [] Weakness or numbness in arms   [] Weakness or numbness in legs Musculoskeletal:  [x] Arthritis   [] Joint swelling   [x] Joint pain   [] Low back pain Hematologic:  [] Easy bruising  [] Easy bleeding   [] Hypercoagulable state   [] Anemic   Gastrointestinal:  [] Blood in stool   [] Vomiting blood  [] Gastroesophageal reflux/heartburn   [] Abdominal pain Genitourinary:  [] Chronic kidney disease   [] Difficult urination  [] Frequent urination  [] Burning with urination   [] Hematuria Skin:  [] Rashes   [] Ulcers   [] Wounds Psychological:  [] History of anxiety   []  History of major depression.    Physical Exam BP (!) 232/113   Pulse 71   Resp 16  Gen:  WD/WN, NAD. Appears younger than stated age. Head: [place]/AT, No temporalis wasting. Ear/Nose/Throat: Hearing grossly intact, nares w/o erythema or drainage, oropharynx w/o Erythema/Exudate Eyes: Conjunctiva clear, sclera non-icteric  Neck: trachea midline.  No JVD.  Pulmonary:  Good air movement, respirations not labored, no use of accessory muscles  Cardiac: RRR, no JVD Vascular:  Vessel Right Left  Radial Palpable Palpable                          PT 1+ 1+  DP NP NP   Gastrointestinal:. No masses, surgical incisions, or scars. Musculoskeletal: M/S 5/5 throughout.  Sluggish capillary refill is present in both forefeet.  Healed transmetatarsal amputation is present on the left.  Trace left lower extremity edema is present.   Neurologic: Sensation grossly intact in extremities.  Symmetrical.  Speech is fluent. Motor exam as listed above. Psychiatric: Judgment intact, Mood & affect appropriate for pt's clinical  situation. Dermatologic: No rashes or ulcers noted.  No cellulitis or open wounds.    Radiology DG Foot Complete Left Result Date: 08/21/2023 CLINICAL DATA:  Prior mid metatarsal amputation with new pain with weight-bearing EXAM: LEFT FOOT - COMPLETE 3+ VIEW COMPARISON:  None Available. FINDINGS: Transmetatarsal amputation. With some remodeling of the stumps of the first second and third and fourth digits. No evidence of residual osteomyelitis or other bony abnormalities Subcutaneous tissues are grossly unremarkable. Soft tissue vascular calcifications of the posterior tibialis artery IMPRESSION: Transmetatarsal amputation. No evidence of residual osteomyelitis or other bony abnormalities. Electronically Signed   By: Shaaron Adler M.D.   On: 08/21/2023 11:48    Labs No results found for this or any previous visit (from the past 2160 hours).  Assessment/Plan:  DM (diabetes mellitus), type 2 with peripheral vascular complications (HCC) blood glucose control important in reducing the progression of atherosclerotic disease. Also, involved in wound healing. On appropriate medications.   Benign essential HTN blood pressure control important in reducing the progression of atherosclerotic disease. On appropriate oral medications.   Dyslipidemia lipid control important in reducing the progression of atherosclerotic disease. Continue statin therapy   Atherosclerosis of native arteries of extremity with rest pain (HCC) ABIs today are 0.54 on the right 0.58 on the left.  She has had previous transmetatarsal amputation on the left with digit pressures are not available.  Her waveforms are monophasic and not particularly brisk.  Her foot has an ischemic appearance and she describes symptoms consistent with ischemic rest pain.  This is clearly a critical and limb threatening situation.  The patient has extensive medical comorbidities but without revascularization, I suspect limb loss is upcoming.  I  discussed the risks and benefits of angiography with possible revascularization.  I discussed the procedure in detail and she is agreeable to proceed.     Festus Barren 08/25/2023, 3:36 PM   This note was created with Dragon medical transcription system.  Any errors from dictation are unintentional.

## 2023-08-25 NOTE — Telephone Encounter (Signed)
 I attempted to contact the patient to schedule a left leg angio with Dr. Wyn Quaker. Unable to leave a voicemail as the voicemail box is full.

## 2023-08-25 NOTE — Assessment & Plan Note (Signed)
 ABIs today are 0.54 on the right 0.58 on the left.  She has had previous transmetatarsal amputation on the left with digit pressures are not available.  Her waveforms are monophasic and not particularly brisk.  Her foot has an ischemic appearance and she describes symptoms consistent with ischemic rest pain.  This is clearly a critical and limb threatening situation.  The patient has extensive medical comorbidities but without revascularization, I suspect limb loss is upcoming.  I discussed the risks and benefits of angiography with possible revascularization.  I discussed the procedure in detail and she is agreeable to proceed.

## 2023-08-25 NOTE — Progress Notes (Signed)
 [name], female   DOB: 1945-10-22, 78 y.o.   MRN: [id number]  Chief Complaint  Patient presents with   Follow-up    Pain in little toe on left foot    HPI Lisa [name] is a 78 y.o. female.  I am asked to see the patient by Dr. Carlynn Purl for evaluation of her PAD.  She is a known patient to [place] but has not been seen in the office in over 2 years.  She has developed debilitating pain in her left foot over the past few months.  There is been no trauma, injury, or inciting event.  The foot has become cooler and more dark.  She denies any thing that makes it better.  She does try to dangle her leg.  Her walking has become extremely limited as well.  She still is not having much pain in her right leg.  No open sores although she does have a couple of areas that appear to be impending ulcerations on the left lateral forefoot.  Her previous transmetatarsal amputation remains healed. ABIs today are 0.54 on the right 0.58 on the left.  She has had previous transmetatarsal amputation on the left with digit pressures are not available.  Her waveforms are monophasic and not particularly brisk.     Past Medical History:  Diagnosis Date   Blindness of right eye    CHF (congestive heart failure) (HCC)    Depression    Diabetes mellitus without complication (HCC)    DVT of leg (deep venous thrombosis) (HCC) 2011   Glaucoma    Grade II diastolic dysfunction    Hyperlipidemia    Hypertension    Vascular disease     Past Surgical History:  Procedure Laterality Date   ABDOMINAL HYSTERECTOMY  1992   Total   CATARACT EXTRACTION W/PHACO Left 02/25/2023   Procedure: CATARACT EXTRACTION PHACO AND INTRAOCULAR LENS PLACEMENT (IOC) LEFT DIABETIC  malyugin;  Surgeon: Lockie Mola, MD;  Location: [facility];  Service: Ophthalmology;  Laterality: Left;  10.78 0:49.1   EYE SURGERY     TOE AMPUTATION Left 2011   All of her toes on the left foot   VASCULAR SURGERY        Family History  Problem Relation Age of Onset   Heart disease Father    Diabetes Mother    Diabetes Brother    Kidney disease Brother        Transplant   Heart disease Brother    Diabetes Sister    Diabetes Sister      Social History   Tobacco Use   Smoking status: Every Day    Current packs/day: 0.25    Average packs/day: 0.3 packs/day for 40.5 years (10.1 ttl pk-yrs)    Types: Cigarettes    Start date: 03/16/2000   Smokeless tobacco: Never  Vaping Use   Vaping status: Never Used  Substance Use Topics   Alcohol use: Yes    Comment: Wine Ocassionally   Drug use: No     Allergies  Allergen Reactions   Other Itching    Nucinta - Hallucinations   Tapentadol Hcl Itching    Hallucinations   Actos [Pioglitazone]     History of CHF   Penicillins Rash    Current Outpatient Medications  Medication Sig Dispense Refill   acetaminophen (TYLENOL) 500 MG tablet Take 2 tablets by mouth every 6 (six) hours as needed.     amLODipine (NORVASC) 10 MG  tablet TAKE 1 TABLET BY MOUTH ONE TIME A DAY. 30 tablet 0   aspirin 81 MG tablet Take 81 mg by mouth daily.     atropine 1 % ophthalmic solution Place 1 drop into the left eye 3 (three) times daily.     brimonidine (ALPHAGAN) 0.2 % ophthalmic solution Place 1 drop into both eyes 2 (two) times daily.  3   citalopram (CELEXA) 10 MG tablet Take 1 tablet (10 mg total) by mouth daily. 90 tablet 1   clopidogrel (PLAVIX) 75 MG tablet Take 1 tablet (75 mg total) by mouth daily. 90 tablet 1   Cyanocobalamin (B-12) 1000 MCG SUBL Place 1 tablet under the tongue daily. 90 tablet 1   dapagliflozin propanediol (FARXIGA) 10 MG TABS tablet TAKE 1 TABLET BY MOUTH EVERY DAY BEFORE BREAKFAST 90 tablet 1   dorzolamide-timolol (COSOPT) 22.3-6.8 MG/ML ophthalmic solution Place 1 drop into both eyes 2 (two) times daily.     feeding supplement (ENSURE ENLIVE / ENSURE PLUS) LIQD Take 237 mLs by mouth 2 (two) times daily between meals.     hydrALAZINE  (APRESOLINE) 50 MG tablet Take 1 tablet (50 mg total) by mouth every 8 (eight) hours.     losartan (COZAAR) 100 MG tablet Take 1 tablet (100 mg total) by mouth daily.     metoprolol succinate (TOPROL XL) 25 MG 24 hr tablet Take 1 tablet (25 mg total) by mouth daily. Take with or immediately following a meal. 90 tablet 1   Multiple Vitamin (MULTIVITAMIN) tablet Take 1 tablet by mouth daily.     nitroGLYCERIN (NITROSTAT) 0.4 MG SL tablet Place 1 tablet (0.4 mg total) under the tongue every 5 (five) minutes as needed for chest pain. 15 tablet 12   polyvinyl alcohol (LIQUIFILM TEARS) 1.4 % ophthalmic solution Place 2 drops into the right eye as needed for dry eyes.     prednisoLONE acetate (PRED FORTE) 1 % ophthalmic suspension Place 1 drop into the left eye 4 (four) times daily.     psyllium (HYDROCIL/METAMUCIL) 95 % PACK Take 1 packet by mouth daily.     rosuvastatin (CRESTOR) 10 MG tablet Take 1 tablet (10 mg total) by mouth daily. 90 tablet 1   sitaGLIPtin (JANUVIA) 100 MG tablet Take 1 tablet (100 mg total) by mouth daily. 90 tablet 1   traMADol (ULTRAM) 50 MG tablet Take 1 tablet (50 mg total) by mouth every 6 (six) hours as needed. 20 tablet 0   Vibegron (GEMTESA) 75 MG TABS Take 75 mg by mouth daily. 30 tablet 11   Vitamin D, Ergocalciferol, (DRISDOL) 1.25 MG (50000 UNIT) CAPS capsule TAKE 1 CAPSULE (50,000 UNITS TOTAL) BY MOUTH EVERY 7 (SEVEN) DAYS 8 capsule 1   No current facility-administered medications for this visit.      REVIEW OF SYSTEMS (Negative unless checked)   Constitutional: [] Weight loss  [] Fever  [] Chills Cardiac: [] Chest pain   [] Chest pressure   [] Palpitations   [] Shortness of breath when laying flat   [] Shortness of breath at rest   [] Shortness of breath with exertion. Vascular:  [x] Pain in legs with walking   [x] Pain in legs at rest   [] Pain in legs when laying flat   [] Claudication   [] Pain in feet when walking  [x] Pain in feet at rest  [] Pain in feet when laying flat    [] History of DVT   [] Phlebitis   [] Swelling in legs   [] Varicose veins   [] Non-healing ulcers Pulmonary:   [] Uses home oxygen   []   Productive cough   [] Hemoptysis   [] Wheeze  [] COPD   [] Asthma Neurologic:  [] Dizziness  [] Blackouts   [] Seizures   [] History of stroke   [] History of TIA  [] Aphasia   [] Temporary blindness   [] Dysphagia   [] Weakness or numbness in arms   [] Weakness or numbness in legs Musculoskeletal:  [x] Arthritis   [] Joint swelling   [x] Joint pain   [] Low back pain Hematologic:  [] Easy bruising  [] Easy bleeding   [] Hypercoagulable state   [] Anemic   Gastrointestinal:  [] Blood in stool   [] Vomiting blood  [] Gastroesophageal reflux/heartburn   [] Abdominal pain Genitourinary:  [] Chronic kidney disease   [] Difficult urination  [] Frequent urination  [] Burning with urination   [] Hematuria Skin:  [] Rashes   [] Ulcers   [] Wounds Psychological:  [] History of anxiety   []  History of major depression.    Physical Exam BP (!) 232/113   Pulse 71   Resp 16  Gen:  WD/WN, NAD. Appears younger than stated age. Head: [place]/AT, No temporalis wasting. Ear/Nose/Throat: Hearing grossly intact, nares w/o erythema or drainage, oropharynx w/o Erythema/Exudate Eyes: Conjunctiva clear, sclera non-icteric  Neck: trachea midline.  No JVD.  Pulmonary:  Good air movement, respirations not labored, no use of accessory muscles  Cardiac: RRR, no JVD Vascular:  Vessel Right Left  Radial Palpable Palpable                          PT 1+ 1+  DP NP NP   Gastrointestinal:. No masses, surgical incisions, or scars. Musculoskeletal: M/S 5/5 throughout.  Sluggish capillary refill is present in both forefeet.  Healed transmetatarsal amputation is present on the left.  Trace left lower extremity edema is present.   Neurologic: Sensation grossly intact in extremities.  Symmetrical.  Speech is fluent. Motor exam as listed above. Psychiatric: Judgment intact, Mood & affect appropriate for pt's clinical  situation. Dermatologic: No rashes or ulcers noted.  No cellulitis or open wounds.    Radiology DG Foot Complete Left Result Date: 08/21/2023 CLINICAL DATA:  Prior mid metatarsal amputation with new pain with weight-bearing EXAM: LEFT FOOT - COMPLETE 3+ VIEW COMPARISON:  None Available. FINDINGS: Transmetatarsal amputation. With some remodeling of the stumps of the first second and third and fourth digits. No evidence of residual osteomyelitis or other bony abnormalities Subcutaneous tissues are grossly unremarkable. Soft tissue vascular calcifications of the posterior tibialis artery IMPRESSION: Transmetatarsal amputation. No evidence of residual osteomyelitis or other bony abnormalities. Electronically Signed   By: Shaaron Adler M.D.   On: 08/21/2023 11:48    Labs No results found for this or any previous visit (from the past 2160 hours).  Assessment/Plan:  DM (diabetes mellitus), type 2 with peripheral vascular complications (HCC) blood glucose control important in reducing the progression of atherosclerotic disease. Also, involved in wound healing. On appropriate medications.   Benign essential HTN blood pressure control important in reducing the progression of atherosclerotic disease. On appropriate oral medications.   Dyslipidemia lipid control important in reducing the progression of atherosclerotic disease. Continue statin therapy   Atherosclerosis of native arteries of extremity with rest pain (HCC) ABIs today are 0.54 on the right 0.58 on the left.  She has had previous transmetatarsal amputation on the left with digit pressures are not available.  Her waveforms are monophasic and not particularly brisk.  Her foot has an ischemic appearance and she describes symptoms consistent with ischemic rest pain.  This is clearly a critical and limb threatening situation.  The patient has extensive medical comorbidities but without revascularization, I suspect limb loss is upcoming.  I  discussed the risks and benefits of angiography with possible revascularization.  I discussed the procedure in detail and she is agreeable to proceed.     Festus Barren 08/25/2023, 3:36 PM   This note was created with Dragon medical transcription system.  Any errors from dictation are unintentional.

## 2023-08-26 ENCOUNTER — Other Ambulatory Visit: Payer: Self-pay | Admitting: Family Medicine

## 2023-08-26 DIAGNOSIS — R32 Unspecified urinary incontinence: Secondary | ICD-10-CM

## 2023-08-26 LAB — VAS US ABI WITH/WO TBI

## 2023-08-26 NOTE — Telephone Encounter (Signed)
 Spoke with the patient and she is scheduled with Dr. Wyn Quaker on 12/03/23 with a 9:00 am arrival time to the [facility]. Pre-procedure instructions were discussed and will be mailed.

## 2023-09-03 ENCOUNTER — Ambulatory Visit
Admission: RE | Admit: 2023-09-03 | Discharge: 2023-09-03 | Disposition: A | Discharge status: Home / Self Care | Attending: Vascular Surgery | Admitting: Vascular Surgery

## 2023-09-03 ENCOUNTER — Encounter
Admission: RE | Disposition: A | Discharge status: Home / Self Care | Payer: Self-pay | Source: Home / Self Care | Attending: Vascular Surgery

## 2023-09-03 ENCOUNTER — Encounter: Payer: Self-pay | Admitting: Vascular Surgery

## 2023-09-03 DIAGNOSIS — I110 Hypertensive heart disease with heart failure: Secondary | ICD-10-CM | POA: Diagnosis not present

## 2023-09-03 DIAGNOSIS — Z89422 Acquired absence of other left toe(s): Secondary | ICD-10-CM | POA: Diagnosis not present

## 2023-09-03 DIAGNOSIS — E785 Hyperlipidemia, unspecified: Secondary | ICD-10-CM | POA: Insufficient documentation

## 2023-09-03 DIAGNOSIS — F17210 Nicotine dependence, cigarettes, uncomplicated: Secondary | ICD-10-CM | POA: Diagnosis not present

## 2023-09-03 DIAGNOSIS — I77819 Aortic ectasia, unspecified site: Secondary | ICD-10-CM | POA: Diagnosis not present

## 2023-09-03 DIAGNOSIS — Z89432 Acquired absence of left foot: Secondary | ICD-10-CM | POA: Diagnosis not present

## 2023-09-03 DIAGNOSIS — I771 Stricture of artery: Secondary | ICD-10-CM | POA: Diagnosis not present

## 2023-09-03 DIAGNOSIS — Z79899 Other long term (current) drug therapy: Secondary | ICD-10-CM | POA: Insufficient documentation

## 2023-09-03 DIAGNOSIS — E1151 Type 2 diabetes mellitus with diabetic peripheral angiopathy without gangrene: Secondary | ICD-10-CM | POA: Insufficient documentation

## 2023-09-03 DIAGNOSIS — Z7984 Long term (current) use of oral hypoglycemic drugs: Secondary | ICD-10-CM | POA: Diagnosis not present

## 2023-09-03 DIAGNOSIS — I5032 Chronic diastolic (congestive) heart failure: Secondary | ICD-10-CM | POA: Diagnosis not present

## 2023-09-03 DIAGNOSIS — Z9889 Other specified postprocedural states: Secondary | ICD-10-CM

## 2023-09-03 DIAGNOSIS — I70222 Atherosclerosis of native arteries of extremities with rest pain, left leg: Secondary | ICD-10-CM | POA: Diagnosis not present

## 2023-09-03 DIAGNOSIS — I70229 Atherosclerosis of native arteries of extremities with rest pain, unspecified extremity: Secondary | ICD-10-CM

## 2023-09-03 HISTORY — PX: LOWER EXTREMITY ANGIOGRAPHY: CATH118251

## 2023-09-03 HISTORY — PX: LOWER EXTREMITY INTERVENTION: CATH118252

## 2023-09-03 LAB — BUN: BUN: 20 mg/dL (ref 8–23)

## 2023-09-03 LAB — CREATININE, SERUM
Creatinine, Ser: 0.75 mg/dL (ref 0.44–1.00)
GFR, Estimated: >60 mL/min (ref >60)

## 2023-09-03 SURGERY — LOWER EXTREMITY INTERVENTION
Anesthesia: Moderate Sedation | Site: Leg Lower | Laterality: Left

## 2023-09-03 MED ORDER — ONDANSETRON HCL 4 MG/2ML IJ SOLN: 4.0000 mg | Freq: Four times a day (QID) | INTRAMUSCULAR | Status: DC | PRN

## 2023-09-03 MED ORDER — CEFAZOLIN SODIUM-DEXTROSE 2-4 GM/100ML-% IV SOLN
2.0000 g | INTRAVENOUS | Status: AC
  Administered 2023-09-03: 2 g via INTRAVENOUS

## 2023-09-03 MED ORDER — MIDAZOLAM HCL 2 MG/2ML IJ SOLN
INTRAMUSCULAR | Status: AC
  Filled 2023-09-03: qty 4

## 2023-09-03 MED ORDER — LABETALOL HCL 5 MG/ML IV SOLN: 10.0000 mg | INTRAVENOUS | Status: DC | PRN

## 2023-09-03 MED ORDER — HYDROMORPHONE HCL 1 MG/ML IJ SOLN: 1.0000 mg | Freq: Once | INTRAMUSCULAR | Status: DC | PRN

## 2023-09-03 MED ORDER — SODIUM CHLORIDE 0.9% FLUSH: 3.0000 mL | INTRAVENOUS | Status: DC | PRN

## 2023-09-03 MED ORDER — HYDRALAZINE HCL 20 MG/ML IJ SOLN: 5.0000 mg | INTRAMUSCULAR | Status: DC | PRN

## 2023-09-03 MED ORDER — LIDOCAINE-EPINEPHRINE (PF) 1 %-1:200000 IJ SOLN
INTRAMUSCULAR | Status: DC | PRN
  Administered 2023-09-03: 10 mL

## 2023-09-03 MED ORDER — HEPARIN SODIUM (PORCINE) 1000 UNIT/ML IJ SOLN
INTRAMUSCULAR | Status: AC
  Filled 2023-09-03: qty 10

## 2023-09-03 MED ORDER — FAMOTIDINE 20 MG PO TABS: 40.0000 mg | ORAL_TABLET | Freq: Once | ORAL | Status: DC | PRN

## 2023-09-03 MED ORDER — IODIXANOL 320 MG/ML IV SOLN
INTRAVENOUS | Status: DC | PRN
  Administered 2023-09-03: 60 mL

## 2023-09-03 MED ORDER — SODIUM CHLORIDE 0.9 % IV SOLN: INTRAVENOUS | Status: DC

## 2023-09-03 MED ORDER — HEPARIN (PORCINE) IN NACL 1000-0.9 UT/500ML-% IV SOLN
INTRAVENOUS | Status: DC | PRN
  Administered 2023-09-03: 500 mL

## 2023-09-03 MED ORDER — OXYCODONE HCL 5 MG PO TABS
ORAL_TABLET | ORAL | Status: AC
  Filled 2023-09-03: qty 1

## 2023-09-03 MED ORDER — CEFAZOLIN SODIUM-DEXTROSE 2-4 GM/100ML-% IV SOLN
INTRAVENOUS | Status: AC
  Filled 2023-09-03: qty 100

## 2023-09-03 MED ORDER — MIDAZOLAM HCL 2 MG/2ML IJ SOLN
INTRAMUSCULAR | Status: DC | PRN
Start: 2023-09-03 — End: 2023-09-03
  Administered 2023-09-03 (×2): .5 mg via INTRAVENOUS
  Administered 2023-09-03: 1 mg via INTRAVENOUS
  Administered 2023-09-03 (×3): .5 mg via INTRAVENOUS

## 2023-09-03 MED ORDER — HEPARIN SODIUM (PORCINE) 1000 UNIT/ML IJ SOLN
INTRAMUSCULAR | Status: DC | PRN
  Administered 2023-09-03: 5000 [IU] via INTRAVENOUS

## 2023-09-03 MED ORDER — METHYLPREDNISOLONE SODIUM SUCC 125 MG IJ SOLR: 125.0000 mg | Freq: Once | INTRAMUSCULAR | Status: DC | PRN

## 2023-09-03 MED ORDER — ACETAMINOPHEN 325 MG PO TABS: 650.0000 mg | ORAL_TABLET | ORAL | Status: DC | PRN

## 2023-09-03 MED ORDER — FENTANYL CITRATE PF 50 MCG/ML IJ SOSY
PREFILLED_SYRINGE | INTRAMUSCULAR | Status: AC
  Filled 2023-09-03: qty 1

## 2023-09-03 MED ORDER — NITROGLYCERIN 1 MG/10 ML FOR IR/CATH LAB
INTRA_ARTERIAL | Status: DC | PRN
  Administered 2023-09-03: 300 ug via INTRA_ARTERIAL

## 2023-09-03 MED ORDER — FENTANYL CITRATE (PF) 100 MCG/2ML IJ SOLN
INTRAMUSCULAR | Status: DC | PRN
  Administered 2023-09-03 (×3): 25 ug via INTRAVENOUS
  Administered 2023-09-03: 50 ug via INTRAVENOUS
  Administered 2023-09-03: 25 ug via INTRAVENOUS

## 2023-09-03 MED ORDER — FENTANYL CITRATE (PF) 100 MCG/2ML IJ SOLN
INTRAMUSCULAR | Status: AC
  Filled 2023-09-03: qty 2

## 2023-09-03 MED ORDER — DIPHENHYDRAMINE HCL 50 MG/ML IJ SOLN: 50.0000 mg | Freq: Once | INTRAMUSCULAR | Status: DC | PRN

## 2023-09-03 MED ORDER — SODIUM CHLORIDE 0.9% FLUSH: 3.0000 mL | Freq: Two times a day (BID) | INTRAVENOUS | Status: DC

## 2023-09-03 MED ORDER — NITROGLYCERIN 1 MG/10 ML FOR IR/CATH LAB
INTRA_ARTERIAL | Status: AC
  Filled 2023-09-03: qty 10

## 2023-09-03 MED ORDER — SODIUM CHLORIDE 0.9 % IV SOLN: 250.0000 mL | INTRAVENOUS | Status: DC | PRN

## 2023-09-03 MED ORDER — MIDAZOLAM HCL 2 MG/ML PO SYRP: 8.0000 mg | ORAL_SOLUTION | Freq: Once | ORAL | Status: DC | PRN

## 2023-09-03 MED ORDER — OXYCODONE HCL 5 MG PO TABS
5.0000 mg | ORAL_TABLET | Freq: Once | ORAL | Status: AC
  Administered 2023-09-03: 5 mg via ORAL

## 2023-09-03 MED ORDER — HYDRALAZINE HCL 20 MG/ML IJ SOLN
INTRAMUSCULAR | Status: AC
  Filled 2023-09-03: qty 1

## 2023-09-03 SURGICAL SUPPLY — 30 items
BALLN ARMADA 2.5X100X150 (BALLOONS) ×1 IMPLANT
BALLN DORADO 5X60X80 (BALLOONS) ×1 IMPLANT
BALLN JADE .014 3.0 X 20 (BALLOONS) ×1 IMPLANT
BALLN LUTONIX 018 4X220X130 (BALLOONS) ×1 IMPLANT
BALLN LUTONIX 018 4X80X130 (BALLOONS) ×1 IMPLANT
BALLN ULTRVRSE 3X300X150 OTW (BALLOONS) ×1 IMPLANT
BALLOON ARMADA 2.5X100X150 (BALLOONS) IMPLANT
BALLOON DORADO 5X60X80 (BALLOONS) IMPLANT
BALLOON JADE .014 3.0 X 20 (BALLOONS) IMPLANT
BALLOON LUTONIX 018 4X220X130 (BALLOONS) IMPLANT
BALLOON LUTONIX 018 4X80X130 (BALLOONS) IMPLANT
BALLOON ULTRVRSE 3X300X150 OTW (BALLOONS) IMPLANT
CATH ANGIO 5F PIGTAIL 65CM (CATHETERS) IMPLANT
CATH BEACON 5 .038 100 VERT TP (CATHETERS) IMPLANT
CATH ROTAREX 135 6FR (CATHETERS) IMPLANT
COVER PROBE ULTRASOUND 5X96 (MISCELLANEOUS) IMPLANT
DEVICE PRESTO INFLATION (MISCELLANEOUS) IMPLANT
DEVICE STARCLOSE SE CLOSURE (Vascular Products) IMPLANT
GLIDEWIRE ADV .035X260CM (WIRE) IMPLANT
PACK ANGIOGRAPHY (CUSTOM PROCEDURE TRAY) ×1 IMPLANT
SHEATH ANL2 6FRX45 HC (SHEATH) IMPLANT
SHEATH BRITE TIP 5FRX11 (SHEATH) IMPLANT
STENT ESPRIT BTK 3.0X38 SCAFF (Permanent Stent) IMPLANT
STENT VIABAHN 5X7.5X120 (Permanent Stent) IMPLANT
SYR MEDRAD MARK 7 150ML (SYRINGE) IMPLANT
SYS ESPRIT BTK 3.0X38 SCAFFOLD (Permanent Stent) ×1 IMPLANT
TUBING CONTRAST HIGH PRESS 72 (TUBING) IMPLANT
WIRE G V18X300CM (WIRE) IMPLANT
WIRE J 3MM .035X145CM (WIRE) IMPLANT
WIRE RUNTHROUGH .014X300CM (WIRE) IMPLANT

## 2023-09-03 NOTE — Interval H&P Note (Signed)
 History and Physical Interval Note:  09/03/2023 9:51 AM  Lisa [name]  has presented today for surgery, with the diagnosis of LLE Angio   ASO w rest pain.  The various methods of treatment have been discussed with the patient and family. After consideration of risks, benefits and other options for treatment, the patient has consented to  Procedure(s): LOWER EXTREMITY INTERVENTION (Left) Lower Extremity Angiography (Left) as a surgical intervention.  The patient's history has been reviewed, patient examined, no change in status, stable for surgery.  I have reviewed the patient's chart and labs.  Questions were answered to the patient's satisfaction.     Festus Barren

## 2023-09-03 NOTE — Op Note (Signed)
 [facility]  Percutaneous Study/Intervention Procedural Note   Date of Surgery: 09/03/2023  Surgeon(s):Nathanal Hermiz    Assistants:none  Pre-operative Diagnosis: PAD with rest pain LLE  Post-operative diagnosis:  Same  Procedure(s) Performed:             1.  Ultrasound guidance for vascular access right femoral artery             2.  Catheter placement into left common femoral artery from right femoral approach             3.  Aortogram and selective left lower extremity angiogram             4.  Atherectomy of the left popliteal artery with the [place] Rex device             5.  Angioplasty of the left popliteal artery with 4 mm diameter by 22 cm length Lutonix drug-coated angioplasty balloon  6.  Angioplasty of the left tibioperoneal trunk and peroneal artery with 3 mm diameter angioplasty balloon  7.  Stent placement to the left popliteal artery with 5 mm diameter by 7.5 cm length Viabahn stent  8.  Stent placement to the left tibioperoneal trunk and proximal peroneal artery with 3 mm diameter by 38 mm length Esprit scaffolding system             9.  StarClose closure device right femoral artery  EBL: 50 cc  Contrast: 60 cc  Fluoro Time: 15 minutes  Moderate Conscious Sedation Time: approximately 89 minutes using 3.5 mg of Versed and 150 mcg of Fentanyl              Indications:  Patient is a 78 y.o.female with recurrent rest pain of the left lower extremity several years after previous intervention and transmetatarsal amputation.  Her foot is quite discolored and she has persistent rest pain. The patient has noninvasive study showing markedly reduced perfusion in the right leg. The patient is brought in for angiography for further evaluation and potential treatment.  Due to the limb threatening nature of the situation, angiogram was performed for attempted limb salvage. The patient is aware that if the procedure fails, amputation would be expected.  The patient  also understands that even with successful revascularization, amputation may still be required due to the severity of the situation.  Risks and benefits are discussed and informed consent is obtained.   Procedure:  The patient was identified and appropriate procedural time out was performed.  The patient was then placed supine on the table and prepped and draped in the usual sterile fashion. Moderate conscious sedation was administered during a face to face encounter with the patient throughout the procedure with my supervision of the RN administering medicines and monitoring the patient's vital signs, pulse oximetry, telemetry and mental status throughout from the start of the procedure until the patient was taken to the recovery room. Ultrasound was used to evaluate the right common femoral artery.  It was patent .  A digital ultrasound image was acquired.  A Seldinger needle was used to access the right common femoral artery under direct ultrasound guidance and a permanent image was performed.  A 0.035 J wire was advanced without resistance and a 5Fr sheath was placed.  Pigtail catheter was placed into the aorta and an AP aortogram was performed. This demonstrated normal renal arteries, an ectatic and tortuous aorta and iliac segments without significant stenosis. I then crossed the aortic bifurcation and  advanced to the left femoral head. Selective left lower extremity angiogram was then performed. This demonstrated calcific but not stenotic left common femoral artery, profunda femoris artery, and superficial femoral artery down to the distal segment.  The popliteal artery was heavily diseased and had a short segment occlusion in the proximal portion and a high-grade stenosis in the midportion.  The tibioperoneal trunk and proximal peroneal artery had a greater than 75% stenosis and then the peroneal artery normalized after several centimeters and was the only runoff distally. It was felt that it was in the  patient's best interest to proceed with intervention after these images to avoid a second procedure and a larger amount of contrast and fluoroscopy based off of the findings from the initial angiogram. The patient was systemically heparinized and a 6 [place] Ansell sheath was then placed over the [employer] wire. I then used a Kumpe catheter and the advantage wire to easily cross the occlusion and confirm intraluminal flow in the tibioperoneal trunk.  I then exchanged for a V18 wire and later for a 0.014 run-through wire.  The peroneal artery and tibioperoneal trunk were then treated with a 3 mm diameter angioplasty balloon inflated to 10 atm for 1 minute.  I also used this 3 mm balloon to predilate the popliteal lesion.  Atherectomy was then performed with the [place] Rex device 2 passes made.  This would not pass in the popliteal artery until we predilated the lesion.  After atherectomy in the popliteal artery, a 4 mm diameter by 22 cm length Lutonix drug-coated angioplasty balloon was inflated to 16 atm for 1 minute completion imaging following atherectomy and angioplasty showed most of the popliteal artery markedly improved, but there was a high-grade residual stenosis in the proximal popliteal artery extending for several centimeters.  The tibioperoneal trunk and proximal peroneal artery also still had greater than 50% residual stenosis.  The popliteal lesion was addressed with a 5 mm diameter by 7.5 cm length Viabahn stent postdilated with 5 mm diameter high-pressure angioplasty balloon after there was still a waist with the 4 mm diameter Lutonix drug-coated balloon initially.  Following the high-pressure balloon in the popliteal artery stent, there was less than 10% residual stenosis.  To address the tibioperoneal trunk and most proximal peroneal artery, 3 mm diameter by 38 mm length Esprit system was then used.  This was deployed across the lesion and then postdilated with a 3 mm balloon with excellent  angiographic completion result and less than 10% residual stenosis. I elected to terminate the procedure. The sheath was removed and StarClose closure device was deployed in the right femoral artery with excellent hemostatic result. The patient was taken to the recovery room in stable condition having tolerated the procedure well.  Findings:               Aortogram:  This demonstrated normal renal arteries, an ectatic and tortuous aorta and iliac segments without significant stenosis             Left Lower Extremity:  This demonstrated calcific but not stenotic left common femoral artery, profunda femoris artery, and superficial femoral artery down to the distal segment.  The popliteal artery was heavily diseased and had a short segment occlusion in the proximal portion and a high-grade stenosis in the midportion.  The tibioperoneal trunk and proximal peroneal artery had a greater than 75% stenosis and then the peroneal artery normalized after several centimeters and was the only runoff distally.   Disposition:  Patient was taken to the recovery room in stable condition having tolerated the procedure well.  Complications: None  Festus Barren 09/03/2023 12:19 PM   This note was created with Dragon Medical transcription system. Any errors in dictation are purely unintentional.

## 2023-09-04 ENCOUNTER — Encounter: Payer: Self-pay | Admitting: Vascular Surgery

## 2023-09-04 ENCOUNTER — Telehealth (INDEPENDENT_AMBULATORY_CARE_PROVIDER_SITE_OTHER): Payer: Self-pay | Admitting: Vascular Surgery

## 2023-09-04 NOTE — Telephone Encounter (Signed)
 Patient *had Angio yesterday w/JD. Please advise pt on what she should do.

## 2023-09-04 NOTE — Telephone Encounter (Signed)
 Patient was offered Percocet and she stated that she did not want Percocet. Patient will take the Tramadol from previous prescription for pain relief.

## 2023-09-04 NOTE — Telephone Encounter (Signed)
 Patient states she is in so much pain. Feels like there are knots in her leg and her leg might fall off. Pain is from the tips of her toes to her knee. She has been crying all morning because she is in so much pain.Took 2 tylenol and put a pain patch on- no relief.

## 2023-09-04 NOTE — Telephone Encounter (Signed)
 Patient has Angio w/JD yesterday

## 2023-09-04 NOTE — Telephone Encounter (Signed)
 Dr. Wyn Quaker did quite a bit of intervention yesterday and pain unfortunately is not uncommon afterwards.  I will send in pain medication if we can verify her pharmacy

## 2023-09-12 ENCOUNTER — Other Ambulatory Visit: Payer: Self-pay | Admitting: Family Medicine

## 2023-09-24 ENCOUNTER — Other Ambulatory Visit (INDEPENDENT_AMBULATORY_CARE_PROVIDER_SITE_OTHER): Payer: Self-pay | Admitting: Vascular Surgery

## 2023-09-24 DIAGNOSIS — I739 Peripheral vascular disease, unspecified: Secondary | ICD-10-CM

## 2023-10-01 ENCOUNTER — Ambulatory Visit (INDEPENDENT_AMBULATORY_CARE_PROVIDER_SITE_OTHER)

## 2023-10-01 ENCOUNTER — Encounter (INDEPENDENT_AMBULATORY_CARE_PROVIDER_SITE_OTHER): Payer: Self-pay | Admitting: Nurse Practitioner

## 2023-10-01 ENCOUNTER — Ambulatory Visit (INDEPENDENT_AMBULATORY_CARE_PROVIDER_SITE_OTHER): Admitting: Nurse Practitioner

## 2023-10-01 VITALS — BP 162/79 | HR 60 | Resp 16

## 2023-10-01 DIAGNOSIS — Z9889 Other specified postprocedural states: Secondary | ICD-10-CM | POA: Diagnosis not present

## 2023-10-01 DIAGNOSIS — I739 Peripheral vascular disease, unspecified: Secondary | ICD-10-CM

## 2023-10-01 DIAGNOSIS — E1151 Type 2 diabetes mellitus with diabetic peripheral angiopathy without gangrene: Secondary | ICD-10-CM | POA: Diagnosis not present

## 2023-10-01 DIAGNOSIS — I70222 Atherosclerosis of native arteries of extremities with rest pain, left leg: Secondary | ICD-10-CM

## 2023-10-01 DIAGNOSIS — I10 Essential (primary) hypertension: Secondary | ICD-10-CM

## 2023-10-01 DIAGNOSIS — S98912A Complete traumatic amputation of left foot, level unspecified, initial encounter: Secondary | ICD-10-CM

## 2023-10-02 LAB — VAS US ABI WITH/WO TBI: Left ABI: 0.61

## 2023-10-04 ENCOUNTER — Encounter (INDEPENDENT_AMBULATORY_CARE_PROVIDER_SITE_OTHER): Payer: Self-pay | Admitting: Nurse Practitioner

## 2023-10-04 MED ORDER — GABAPENTIN 100 MG PO CAPS: 100.0000 mg | ORAL_CAPSULE | Freq: Every day | ORAL | 0 refills | Status: DC

## 2023-10-04 NOTE — Progress Notes (Signed)
 Subjective:    [name], female    DOB: [birth year], 78 y.o.   MRN: [id number] Chief Complaint  Patient presents with   Follow-up    [facility] 4 week with ABIs    The patient returns to the office for followup and review status post angiogram with intervention on 09/03/2023.   Procedure: Procedure(s) Performed:             1.  Ultrasound guidance for vascular access right femoral artery             2.  Catheter placement into left common femoral artery from right femoral approach             3.  Aortogram and selective left lower extremity angiogram             4.  Atherectomy of the left popliteal artery with the Rota Rex device             5.  Angioplasty of the left popliteal artery with 4 mm diameter by 22 cm length Lutonix drug-coated angioplasty balloon             6.  Angioplasty of the left tibioperoneal trunk and peroneal artery with 3 mm diameter angioplasty balloon             7.  Stent placement to the left popliteal artery with 5 mm diameter by 7.5 cm length Viabahn stent             8.  Stent placement to the left tibioperoneal trunk and proximal peroneal artery with 3 mm diameter by 38 mm length Esprit scaffolding system             9.  StarClose closure device right femoral artery   The patient notes improvement in the lower extremity symptoms. No interval shortening of the patient's claudication distance or rest pain symptoms. No new ulcers or wounds have occurred since the last visit.  She continues to have a wound on her left transmetatarsal amputation which she notes has not healed significantly since her angiogram.  There have been no significant changes to the patient's overall health care.  She continues to endorse pain in her left TMA.  She has been offered pain medication which she refuses for fear of addiction.  I suspect this may be phantom limb pain versus rest pain.  No documented history of amaurosis fugax or recent TIA symptoms. There  are no recent neurological changes noted. No documented history of DVT, PE or superficial thrombophlebitis. The patient denies recent episodes of angina or shortness of breath.   Prior ABIs were noncompressible.  Today right ABI is noncompressible and left 0.61. Duplex US  of the bilateral tibial vessels shows monophasic waveforms    Review of Systems  Skin:  Positive for wound.  All other systems reviewed and are negative.      Objective:    Physical Exam Vitals reviewed.  HENT:     Head: Normocephalic.  Cardiovascular:     Rate and Rhythm: Normal rate.  Pulmonary:     Effort: Pulmonary effort is normal.  Musculoskeletal:     Left Lower Extremity: Left leg is amputated below ankle.  Skin:    General: Skin is warm and dry.  Neurological:     Mental Status: She is alert and oriented to person, place, and time.  Psychiatric:        Mood and Affect: Mood normal.  Behavior: Behavior normal.        Thought Content: Thought content normal.        Judgment: Judgment normal.     BP (!) 162/79   Pulse 60   Resp 16   Past Medical History:  Diagnosis Date   Blindness of right eye    CHF (congestive heart failure) (HCC)    Depression    Diabetes mellitus without complication (HCC)    DVT of leg (deep venous thrombosis) (HCC) 2011   Glaucoma    Grade II diastolic dysfunction    Hyperlipidemia    Hypertension    Vascular disease     Social History   Socioeconomic History   Marital status: Widowed    Spouse name: Not on file   Number of children: 0   Years of education: Not on file   Highest education level: Some college, no degree  Occupational History   Occupation: Retired  Tobacco Use   Smoking status: Every Day    Current packs/day: 0.25    Average packs/day: 0.3 packs/day for 40.7 years (10.2 ttl pk-yrs)    Types: Cigarettes    Start date: 03/16/2000   Smokeless tobacco: Never  Vaping Use   Vaping status: Never Used  Substance and Sexual Activity    Alcohol  use: Yes    Comment: Wine Ocassionally   Drug use: No   Sexual activity: Not Currently  Other Topics Concern   Not on file  Social History Narrative   Not on file   Social Drivers of Health   Financial Resource Strain: Medium Risk (04/08/2023)   Overall Financial Resource Strain (CARDIA)    Difficulty of Paying Living Expenses: Somewhat hard  Food Insecurity: No Food Insecurity (04/08/2023)   Hunger Vital Sign    Worried About Running Out of Food in the Last Year: Never true    Ran Out of Food in the Last Year: Never true  Transportation Needs: No Transportation Needs (04/08/2023)   PRAPARE - Administrator, Civil Service (Medical): No    Lack of Transportation (Non-Medical): No  Physical Activity: Insufficiently Active (03/26/2021)   Exercise Vital Sign    Days of Exercise per Week: 3 days    Minutes of Exercise per Session: 20 min  Stress: No Stress Concern Present (03/26/2021)   [employer] of Occupational Health - Occupational Stress Questionnaire    Feeling of Stress : Only a little  Social Connections: Socially Isolated (03/19/2022)   Social Connection and Isolation Panel [NHANES]    Frequency of Communication with Friends and Family: More than three times a week    Frequency of Social Gatherings with Friends and Family: Once a week    Attends Religious Services: Never    Database administrator or Organizations: No    Attends Banker Meetings: Never    Marital Status: Widowed  Intimate Partner Violence: Not At Risk (04/08/2023)   Humiliation, Afraid, Rape, and Kick questionnaire    Fear of Current or Ex-Partner: No    Emotionally Abused: No    Physically Abused: No    Sexually Abused: No    Past Surgical History:  Procedure Laterality Date   ABDOMINAL HYSTERECTOMY  1992   Total   CATARACT EXTRACTION W/PHACO Left 02/25/2023   Procedure: CATARACT EXTRACTION PHACO AND INTRAOCULAR LENS PLACEMENT (IOC) LEFT DIABETIC  malyugin;   Surgeon: Annell Kidney, MD;  Location: [facility];  Service: Ophthalmology;  Laterality: Left;  10.78 0:49.1  EYE SURGERY     LOWER EXTREMITY ANGIOGRAPHY Left 09/03/2023   Procedure: Lower Extremity Angiography;  Surgeon: Celso College, MD;  Location: [facility] INVASIVE [facility];  Service: Cardiovascular;  Laterality: Left;   LOWER EXTREMITY INTERVENTION Left 09/03/2023   Procedure: LOWER EXTREMITY INTERVENTION;  Surgeon: Celso College, MD;  Location: [facility]VASIVE CV [facility];  Service: Cardiovascular;  Laterality: Left;   TOE AMPUTATION Left 2011   All of her toes on the left foot   VASCULAR SURGERY      Family History  Problem Relation Age of Onset   Heart disease Father    Diabetes Mother    Diabetes Brother    Kidney disease Brother        Transplant   Heart disease Brother    Diabetes Sister    Diabetes Sister     Allergies  Allergen Reactions   Other Itching    Nucinta - Hallucinations   Tapentadol Hcl Itching    Hallucinations   Actos  [Pioglitazone ]     History of CHF   Penicillins Rash       Latest Ref Rng & Units 04/10/2023    4:19 AM 04/09/2023    5:51 AM 04/08/2023    6:40 AM  CBC  WBC 4.0 - 10.5 K/uL 5.9  7.6  8.8   Hemoglobin 12.0 - 15.0 g/dL 40.9  81.1  91.4   Hematocrit 36.0 - 46.0 % 31.9  30.8  32.1   Platelets 150 - 400 K/uL 323  350  350        CMP     Component Value Date/Time   NA 137 04/12/2023 0424   NA 142 09/25/2015 1620   K 4.8 04/12/2023 0424   CL 106 04/12/2023 0424   CO2 23 04/12/2023 0424   GLUCOSE 105 (H) 04/12/2023 0424   BUN 20 09/03/2023 0946   BUN 8 09/25/2015 1620   CREATININE 0.75 09/03/2023 0946   CREATININE 0.77 02/03/2023 1352   CALCIUM  8.5 (L) 04/12/2023 0424   PROT 6.8 04/07/2023 2154   PROT 6.8 09/25/2015 1620   ALBUMIN 2.7 (L) 04/07/2023 2154   ALBUMIN 4.4 09/25/2015 1620   AST 23 04/07/2023 2154   ALT 17 04/07/2023 2154   ALKPHOS 97 04/07/2023 2154   BILITOT 1.0 04/07/2023 2154   BILITOT 0.4  09/25/2015 1620   EGFR 79 02/03/2023 1352   GFRNONAA >60 09/03/2023 0946   GFRNONAA 64 02/17/2020 1456     VAS US  ABI WITH/WO TBI Result Date: 10/02/2023  LOWER EXTREMITY DOPPLER STUDY Patient Name:  Jemimah Gunst  Date of Exam:   10/01/2023 Medical Rec #: [id number]              Accession #:    [id number] Date of Birth: 27-Jun-1945              Patient Gender: F Patient Age:   59 years Exam Location:  [facility] Procedure:      VAS US  ABI WITH/WO TBI Referring Phys: Reymundo Caulk DEW --------------------------------------------------------------------------------  Indications: Claudication, rest pain, and peripheral artery disease. High Risk Factors: Hypertension, Diabetes, current smoker. Other Factors: Worsening pain and discoloration right great toe.  Vascular Interventions: 09/03/2023 Stent left pop and tib per trunk                          03/27/10 & 08/05/10: Left popliteal, TP trunk & peroneal  artery PTAs;                         Left toes amputated. Performing Technologist: Oneta Bilberry RVT  Examination Guidelines: A complete evaluation includes at minimum, Doppler waveform signals and systolic blood pressure reading at the level of bilateral brachial, anterior tibial, and posterior tibial arteries, when vessel segments are accessible. Bilateral testing is considered an integral part of a complete examination. Photoelectric Plethysmograph (PPG) waveforms and toe systolic pressure readings are included as required and additional duplex testing as needed. Limited examinations for reoccurring indications may be performed as noted.  ABI Findings: +---------+------------------+-----+----------+--------+ Right    Rt Pressure (mmHg)IndexWaveform  Comment  +---------+------------------+-----+----------+--------+ Brachial 225                                       +---------+------------------+-----+----------+--------+ PTA      90                0.40  monophasic         +---------+------------------+-----+----------+--------+ DP       255               1.13 monophasic         +---------+------------------+-----+----------+--------+ Great Toe82                0.36                    +---------+------------------+-----+----------+--------+ +---------+------------------+-----+----------+----------------------+ Left     Lt Pressure (mmHg)IndexWaveform  Comment                +---------+------------------+-----+----------+----------------------+ Brachial 222                                                     +---------+------------------+-----+----------+----------------------+ PTA      83                0.37 monophasic                       +---------+------------------+-----+----------+----------------------+ DP       137               0.61 monophasic                       +---------+------------------+-----+----------+----------------------+ Great Toe                                 Meta tarsal amputation +---------+------------------+-----+----------+----------------------+ +-------+----------------+-----------+------------+------------+ ABI/TBIToday's ABI     Today's TBIPrevious ABIPrevious TBI +-------+----------------+-----------+------------+------------+ Right  Non compressible0.36       N/A         0.21         +-------+----------------+-----------+------------+------------+ Left   0.61            amputation N/A         amputation   +-------+----------------+-----------+------------+------------+  Prior ABIS were invalid as the brachial arteries were non compressible.  Summary: Right: Resting right ankle-brachial index indicates noncompressible right lower extremity arteries. The right toe-brachial index is abnormal. Left: Resting left ankle-brachial index indicates moderate left lower extremity arterial disease. Limited imaging showed patent  popliteal artery and tib-per trunk. *See table(s)  above for measurements and observations.  Electronically signed by Mikki Alexander MD on 10/02/2023 at 11:10:31 AM.    Final    VAS US  ABI WITH/WO TBI Result Date: 08/26/2023  LOWER EXTREMITY DOPPLER STUDY Patient Name:  Yagmur Lindwall  Date of Exam:   08/25/2023 Medical Rec #: [id number]              Accession #:    [id number] Date of Birth: December 23, 1945              Patient Gender: F Patient Age:   70 years Exam Location:  [facility] Procedure:      VAS US  ABI WITH/WO TBI Referring Phys: Mikki Alexander --------------------------------------------------------------------------------  Indications: Claudication, rest pain, and peripheral artery disease. High Risk Factors: Hypertension, Diabetes, current smoker. Other Factors: Worsening pain and discoloration right great toe.  Vascular Interventions: 03/27/10 & 08/05/10: Left popliteal, TP trunk & peroneal                         artery PTAs;                         Left toes amputated. Performing Technologist: Oneta Bilberry RVT  Examination Guidelines: A complete evaluation includes at minimum, Doppler waveform signals and systolic blood pressure reading at the level of bilateral brachial, anterior tibial, and posterior tibial arteries, when vessel segments are accessible. Bilateral testing is considered an integral part of a complete examination. Photoelectric Plethysmograph (PPG) waveforms and toe systolic pressure readings are included as required and additional duplex testing as needed. Limited examinations for reoccurring indications may be performed as noted.  ABI Findings: +---------+------------------+-----+--------+----------------+ Right    Rt Pressure (mmHg)IndexWaveformComment          +---------+------------------+-----+--------+----------------+ Brachial 255                            Non compressible +---------+------------------+-----+--------+----------------+ PTA      138               0.54                           +---------+------------------+-----+--------+----------------+ Great Toe0                 0.00                          +---------+------------------+-----+--------+----------------+ +---------+------------------+-----+----------+----------+ Left     Lt Pressure (mmHg)IndexWaveform  Comment    +---------+------------------+-----+----------+----------+ Brachial 255                                         +---------+------------------+-----+----------+----------+ ATA      0                 0.00 absent               +---------+------------------+-----+----------+----------+ PTA      93                0.36 monophasic           +---------+------------------+-----+----------+----------+ PERO     149               0.58  monophasic           +---------+------------------+-----+----------+----------+ Great Toe                                 amputation +---------+------------------+-----+----------+----------+ +-------+-----------+-----------+------------+------------+ ABI/TBIToday's ABIToday's TBIPrevious ABIPrevious TBI +-------+-----------+-----------+------------+------------+ Right  N/A        0.00       1.09        0.21         +-------+-----------+-----------+------------+------------+ Left   N/A        amputation 1.06        amputation   +-------+-----------+-----------+------------+------------+ Arterial wall calcification precludes accurate ankle pressures and ABIs. ABI's are invalid secondary non compressible brachial arteries. Limited imaging suggests Iliac artery stenosis.  Summary: Right: Resting right ankle-brachial index indicates noncompressible right lower extremity arteries. The right toe-brachial index is abnormal. Left: Invalid secondary to non compressible brachial artery. Limited imaging suggests iliac artery occlusive disease.  *See table(s) above for measurements and observations.  Electronically signed by Mikki Alexander MD on 08/26/2023 at 7:35:17  AM.    Final        Assessment & Plan:   1. Atherosclerosis of native artery of left lower extremity with rest pain (HCC) (Primary)  Recommend:  The patient has evidence of severe atherosclerotic changes of both lower extremities associated with ulceration and tissue loss of the left foot.  This represents a limb threatening ischemia and places the patient at the risk for left  limb loss.  Patient should undergo angiography of the left lower extremity with the hope for intervention for limb salvage.  The risks and benefits as well as the alternative therapies was discussed in detail with the patient.  All questions were answered.  Patient agrees to proceed with left lower extremity angiography.  The patient will follow up with me in the office after the procedure.   2. Benign essential HTN Continue antihypertensive medications as already ordered, these medications have been reviewed and there are no changes at this time.  3. DM (diabetes mellitus), type 2 with peripheral vascular complications (HCC) Continue hypoglycemic medications as already ordered, these medications have been reviewed and there are no changes at this time.  Hgb A1C to be monitored as already arranged by primary service  4. Amputation of left foot, initial encounter [facility]) The patient has some open areas on her left TMA.  Will refer to podiatry for evaluation.  In addition she has several long toenails on the right foot which are in need of care before they cause wounds as well.   Current Outpatient Medications on File Prior to Visit  Medication Sig Dispense Refill   acetaminophen  (TYLENOL ) 500 MG tablet Take 2 tablets by mouth every 6 (six) hours as needed.     amLODipine  (NORVASC ) 10 MG tablet TAKE 1 TABLET BY MOUTH ONE TIME A DAY. 30 tablet 0   aspirin  81 MG tablet Take 81 mg by mouth daily.     atropine  1 % ophthalmic solution Place 1 drop into the left eye 3 (three) times daily.     brimonidine  (ALPHAGAN ) 0.2  % ophthalmic solution Place 1 drop into both eyes 2 (two) times daily.  3   citalopram  (CELEXA ) 10 MG tablet Take 1 tablet (10 mg total) by mouth daily. 90 tablet 1   clopidogrel  (PLAVIX ) 75 MG tablet Take 1 tablet (75 mg total) by mouth daily. 90 tablet 1   Cyanocobalamin  (B-12)  1000 MCG SUBL Place 1 tablet under the tongue daily. 90 tablet 1   dapagliflozin  propanediol (FARXIGA ) 10 MG TABS tablet TAKE 1 TABLET BY MOUTH EVERY DAY BEFORE BREAKFAST 90 tablet 1   dorzolamide -timolol  (COSOPT ) 22.3-6.8 MG/ML ophthalmic solution Place 1 drop into both eyes 2 (two) times daily.     feeding supplement (ENSURE ENLIVE / ENSURE PLUS) LIQD Take 237 mLs by mouth 2 (two) times daily between meals.     hydrALAZINE  (APRESOLINE ) 50 MG tablet Take 1 tablet (50 mg total) by mouth every 8 (eight) hours.     losartan  (COZAAR ) 100 MG tablet Take 1 tablet (100 mg total) by mouth daily.     metoprolol  succinate (TOPROL  XL) 25 MG 24 hr tablet Take 1 tablet (25 mg total) by mouth daily. Take with or immediately following a meal. 90 tablet 1   Multiple Vitamin (MULTIVITAMIN) tablet Take 1 tablet by mouth daily.     nitroGLYCERIN  (NITROSTAT ) 0.4 MG SL tablet Place 1 tablet (0.4 mg total) under the tongue every 5 (five) minutes as needed for chest pain. 15 tablet 12   polyvinyl alcohol  (LIQUIFILM TEARS) 1.4 % ophthalmic solution Place 2 drops into the right eye as needed for dry eyes.     prednisoLONE  acetate (PRED FORTE ) 1 % ophthalmic suspension Place 1 drop into the left eye 4 (four) times daily.     psyllium (HYDROCIL/METAMUCIL) 95 % PACK Take 1 packet by mouth daily.     rosuvastatin  (CRESTOR ) 10 MG tablet Take 1 tablet (10 mg total) by mouth daily. 90 tablet 1   sitaGLIPtin  (JANUVIA ) 100 MG tablet Take 1 tablet (100 mg total) by mouth daily. 90 tablet 1   traMADol  (ULTRAM ) 50 MG tablet Take 1 tablet (50 mg total) by mouth every 6 (six) hours as needed. 20 tablet 0   Vibegron  (GEMTESA ) 75 MG TABS Take 75 mg by mouth  daily. 30 tablet 11   Vitamin D , Ergocalciferol , (DRISDOL ) 1.25 MG (50000 UNIT) CAPS capsule TAKE 1 CAPSULE (50,000 UNITS TOTAL) BY MOUTH EVERY 7 (SEVEN) DAYS 8 capsule 1   No current facility-administered medications on file prior to visit.    There are no Patient Instructions on file for this visit. No follow-ups on file.   Ellanore Vanhook E Lowen Mansouri, NP

## 2023-10-06 ENCOUNTER — Telehealth (INDEPENDENT_AMBULATORY_CARE_PROVIDER_SITE_OTHER): Payer: Self-pay

## 2023-10-06 NOTE — Telephone Encounter (Signed)
 I attempted to call to schedule the patient for a LLE angio with Dr. Vonna Guardian. I was unable to leave a message as the voicemail box was full.

## 2023-10-14 ENCOUNTER — Telehealth (INDEPENDENT_AMBULATORY_CARE_PROVIDER_SITE_OTHER): Payer: Self-pay

## 2023-10-14 NOTE — Telephone Encounter (Signed)
 I attempted to contact the patient to schedule her for a LLE angio with Dr. Vonna Guardian. Patient's phone line is stating no available, I then attempted to contact the patient's brother and his phone line just rings and no one answers.

## 2023-10-28 ENCOUNTER — Other Ambulatory Visit: Payer: Self-pay | Admitting: Family Medicine

## 2023-10-28 ENCOUNTER — Telehealth (INDEPENDENT_AMBULATORY_CARE_PROVIDER_SITE_OTHER): Payer: Self-pay

## 2023-10-28 DIAGNOSIS — R32 Unspecified urinary incontinence: Secondary | ICD-10-CM

## 2023-10-28 NOTE — Telephone Encounter (Signed)
 Patient called in stating no one has called her to be scheduled for her procedure. I advised that the number I was calling and that I had left messages. Patient informed me that was the wrong number as her number had changed. Patient is scheduled with Dr. Vonna Guardian for a LLE angio on 11/05/23 with a 11:00 am arrival time to the [facility]. Pre-procedure instructions were discussed and will be mailed. Patient's phone number was changed in the system.

## 2023-11-05 ENCOUNTER — Ambulatory Visit
Admission: RE | Admit: 2023-11-05 | Discharge: 2023-11-05 | Disposition: A | Discharge status: Home / Self Care | Attending: Vascular Surgery | Admitting: Vascular Surgery

## 2023-11-05 ENCOUNTER — Encounter
Admission: RE | Disposition: A | Discharge status: Home / Self Care | Payer: Self-pay | Source: Home / Self Care | Attending: Vascular Surgery

## 2023-11-05 ENCOUNTER — Other Ambulatory Visit: Payer: Self-pay

## 2023-11-05 ENCOUNTER — Encounter: Payer: Self-pay | Admitting: Vascular Surgery

## 2023-11-05 DIAGNOSIS — Z95828 Presence of other vascular implants and grafts: Secondary | ICD-10-CM | POA: Diagnosis not present

## 2023-11-05 DIAGNOSIS — M792 Neuralgia and neuritis, unspecified: Secondary | ICD-10-CM | POA: Diagnosis not present

## 2023-11-05 DIAGNOSIS — I110 Hypertensive heart disease with heart failure: Secondary | ICD-10-CM | POA: Insufficient documentation

## 2023-11-05 DIAGNOSIS — Z79899 Other long term (current) drug therapy: Secondary | ICD-10-CM | POA: Insufficient documentation

## 2023-11-05 DIAGNOSIS — E1151 Type 2 diabetes mellitus with diabetic peripheral angiopathy without gangrene: Secondary | ICD-10-CM | POA: Diagnosis not present

## 2023-11-05 DIAGNOSIS — I70222 Atherosclerosis of native arteries of extremities with rest pain, left leg: Secondary | ICD-10-CM | POA: Diagnosis not present

## 2023-11-05 DIAGNOSIS — Z9889 Other specified postprocedural states: Secondary | ICD-10-CM

## 2023-11-05 DIAGNOSIS — Z95820 Peripheral vascular angioplasty status with implants and grafts: Secondary | ICD-10-CM | POA: Insufficient documentation

## 2023-11-05 DIAGNOSIS — I5032 Chronic diastolic (congestive) heart failure: Secondary | ICD-10-CM | POA: Insufficient documentation

## 2023-11-05 DIAGNOSIS — E785 Hyperlipidemia, unspecified: Secondary | ICD-10-CM | POA: Diagnosis not present

## 2023-11-05 DIAGNOSIS — F17210 Nicotine dependence, cigarettes, uncomplicated: Secondary | ICD-10-CM | POA: Diagnosis not present

## 2023-11-05 DIAGNOSIS — I77819 Aortic ectasia, unspecified site: Secondary | ICD-10-CM | POA: Diagnosis not present

## 2023-11-05 DIAGNOSIS — I771 Stricture of artery: Secondary | ICD-10-CM

## 2023-11-05 DIAGNOSIS — I70229 Atherosclerosis of native arteries of extremities with rest pain, unspecified extremity: Secondary | ICD-10-CM

## 2023-11-05 HISTORY — PX: LOWER EXTREMITY ANGIOGRAPHY: CATH118251

## 2023-11-05 HISTORY — PX: LOWER EXTREMITY INTERVENTION: CATH118252

## 2023-11-05 LAB — CREATININE, SERUM
Creatinine, Ser: 0.74 mg/dL (ref 0.44–1.00)
GFR, Estimated: >60 mL/min (ref >60)

## 2023-11-05 LAB — BUN: BUN: 15 mg/dL (ref 8–23)

## 2023-11-05 LAB — GLUCOSE, CAPILLARY: Glucose-Capillary: 118 mg/dL — ABNORMAL HIGH (ref 70–99)

## 2023-11-05 SURGERY — LOWER EXTREMITY INTERVENTION
Anesthesia: Moderate Sedation | Site: Leg Lower | Laterality: Left

## 2023-11-05 MED ORDER — VANCOMYCIN HCL IN DEXTROSE 1-5 GM/200ML-% IV SOLN
1000.0000 mg | Freq: Once | INTRAVENOUS | Status: AC
  Administered 2023-11-05: 1000 mg via INTRAVENOUS

## 2023-11-05 MED ORDER — MIDAZOLAM HCL 5 MG/5ML IJ SOLN
INTRAMUSCULAR | Status: AC
  Filled 2023-11-05: qty 5

## 2023-11-05 MED ORDER — DIPHENHYDRAMINE HCL 50 MG/ML IJ SOLN: 50.0000 mg | Freq: Once | INTRAMUSCULAR | Status: DC | PRN

## 2023-11-05 MED ORDER — METHYLPREDNISOLONE SODIUM SUCC 125 MG IJ SOLR: 125.0000 mg | Freq: Once | INTRAMUSCULAR | Status: DC | PRN

## 2023-11-05 MED ORDER — SODIUM CHLORIDE 0.9 % IV SOLN: INTRAVENOUS | Status: DC

## 2023-11-05 MED ORDER — HYDRALAZINE HCL 20 MG/ML IJ SOLN
INTRAMUSCULAR | Status: AC
  Filled 2023-11-05: qty 1

## 2023-11-05 MED ORDER — CLOPIDOGREL BISULFATE 75 MG PO TABS: 75.0000 mg | ORAL_TABLET | Freq: Every day | ORAL | 11 refills | Status: AC

## 2023-11-05 MED ORDER — GABAPENTIN 100 MG PO CAPS: 100.0000 mg | ORAL_CAPSULE | Freq: Three times a day (TID) | ORAL | 2 refills | Status: DC

## 2023-11-05 MED ORDER — HYDRALAZINE HCL 20 MG/ML IJ SOLN
20.0000 mg | Freq: Once | INTRAMUSCULAR | Status: AC
  Administered 2023-11-05: 20 mg via INTRAVENOUS

## 2023-11-05 MED ORDER — LIDOCAINE-EPINEPHRINE (PF) 1 %-1:200000 IJ SOLN
INTRAMUSCULAR | Status: DC | PRN
  Administered 2023-11-05: 10 mL via INTRADERMAL

## 2023-11-05 MED ORDER — HEPARIN (PORCINE) IN NACL 1000-0.9 UT/500ML-% IV SOLN
INTRAVENOUS | Status: DC | PRN
  Administered 2023-11-05: 1000 mL

## 2023-11-05 MED ORDER — HEPARIN SODIUM (PORCINE) 1000 UNIT/ML IJ SOLN
INTRAMUSCULAR | Status: AC
  Filled 2023-11-05: qty 10

## 2023-11-05 MED ORDER — IODIXANOL 320 MG/ML IV SOLN
INTRAVENOUS | Status: DC | PRN
  Administered 2023-11-05: 30 mL

## 2023-11-05 MED ORDER — VANCOMYCIN HCL IN DEXTROSE 1-5 GM/200ML-% IV SOLN
INTRAVENOUS | Status: AC
Start: 2023-11-05 — End: 2023-11-05
  Filled 2023-11-05: qty 200

## 2023-11-05 MED ORDER — FENTANYL CITRATE (PF) 100 MCG/2ML IJ SOLN
INTRAMUSCULAR | Status: AC
Start: 2023-11-05 — End: 2023-11-05
  Filled 2023-11-05: qty 2

## 2023-11-05 MED ORDER — CEFAZOLIN SODIUM-DEXTROSE 2-4 GM/100ML-% IV SOLN
2.0000 g | INTRAVENOUS | Status: DC
Start: 2023-11-05 — End: 2023-11-05

## 2023-11-05 MED ORDER — FAMOTIDINE 20 MG PO TABS: 40.0000 mg | ORAL_TABLET | Freq: Once | ORAL | Status: DC | PRN

## 2023-11-05 MED ORDER — MIDAZOLAM HCL 2 MG/2ML IJ SOLN
INTRAMUSCULAR | Status: DC | PRN
  Administered 2023-11-05: 1 mg via INTRAVENOUS

## 2023-11-05 MED ORDER — MIDAZOLAM HCL 2 MG/ML PO SYRP
8.0000 mg | ORAL_SOLUTION | Freq: Once | ORAL | Status: DC | PRN
Start: 2023-11-05 — End: 2023-11-05

## 2023-11-05 MED ORDER — HYDROMORPHONE HCL 1 MG/ML IJ SOLN: 1.0000 mg | Freq: Once | INTRAMUSCULAR | Status: DC | PRN

## 2023-11-05 MED ORDER — FENTANYL CITRATE (PF) 100 MCG/2ML IJ SOLN
INTRAMUSCULAR | Status: DC | PRN
Start: 2023-11-05 — End: 2023-11-05
  Administered 2023-11-05: 50 ug via INTRAVENOUS

## 2023-11-05 SURGICAL SUPPLY — 8 items
CATH ANGIO 5F PIGTAIL 65CM (CATHETERS) IMPLANT
COVER PROBE ULTRASOUND 5X96 (MISCELLANEOUS) IMPLANT
DEVICE STARCLOSE SE CLOSURE (Vascular Products) IMPLANT
PACK ANGIOGRAPHY (CUSTOM PROCEDURE TRAY) ×1 IMPLANT
SHEATH BRITE TIP 5FRX11 (SHEATH) IMPLANT
SYR MEDRAD MARK 7 150ML (SYRINGE) IMPLANT
TUBING CONTRAST HIGH PRESS 72 (TUBING) IMPLANT
WIRE J 3MM .035X145CM (WIRE) IMPLANT

## 2023-11-05 NOTE — Op Note (Signed)
 [facility]  Percutaneous Study/Intervention Procedural Note   Date of Surgery: 11/05/2023  Surgeon(s):Jenilee Franey    Assistants:none  Pre-operative Diagnosis: PAD with rest pain LLE  Post-operative diagnosis:  Same  Procedure(s) Performed:             1.  Ultrasound guidance for vascular access right femoral artery             2.  Catheter placement into left SFA from right femoral approach             3.  Aortogram and selective left lower extremity angiogram             4.  StarClose closure device right femoral artery  EBL: 5 cc  Contrast: 30 cc  Fluoro Time: 2.6 minutes  Moderate Conscious Sedation Time: approximately 27 minutes using 1 mg of Versed  and 50 mcg of Fentanyl               Indications:  Patient is a 78 y.o.female with continued pain in her left foot.  She underwent revascularization earlier this year and has known severe neuropathic pain. The patient has noninvasive study showing significant reduction in her ABI on the left side but she also has known small vessel disease and only peroneal runoff previously. The patient is brought in for angiography for further evaluation and potential treatment.  Due to the limb threatening nature of the situation, angiogram was performed for attempted limb salvage and evaluate if flow was optimized. The patient is aware that if the procedure fails, amputation would be expected.  The patient also understands that even with successful revascularization, amputation may still be required due to the severity of the situation.  Risks and benefits are discussed and informed consent is obtained.   Procedure:  The patient was identified and appropriate procedural time out was performed.  The patient was then placed supine on the table and prepped and draped in the usual sterile fashion. Moderate conscious sedation was administered during a face to face encounter with the patient throughout the procedure with my  supervision of the RN administering medicines and monitoring the patient's vital signs, pulse oximetry, telemetry and mental status throughout from the start of the procedure until the patient was taken to the recovery room. Ultrasound was used to evaluate the right common femoral artery.  It was patent .  A digital ultrasound image was acquired.  A Seldinger needle was used to access the right common femoral artery under direct ultrasound guidance and a permanent image was performed.  A 0.035 J wire was advanced without resistance and a 5Fr sheath was placed.  Pigtail catheter was placed into the aorta and an AP aortogram was performed.  This demonstrated normal renal arteries, an ectatic and tortuous aorta and iliac segments without significant stenosis.  I then crossed the aortic bifurcation and advanced to the left femoral head and then into the left SFA to help opacify distally. Selective left lower extremity angiogram was then performed. This demonstrated fairly normal common femoral artery, profunda femoris artery, and the superficial femoral artery with only mild disease.  There were 2 areas of mild disease but they only measured out in the 30 to 40% range.  The previously placed popliteal stent was widely patent.  The TP trunk and peroneal artery that was treated with angioplasty and stenting last time was now widely patent.  The peroneal artery did terminate at the ankle and only collateral blood flow was into the  foot which would explain the reduced ABIs.  There was nothing further that could be done for treatment of her vascular disease as the only issues now were small vessel disease. I elected to terminate the procedure. The sheath was removed and StarClose closure device was deployed in the right femoral artery with excellent hemostatic result. The patient was taken to the recovery room in stable condition having tolerated the procedure well.  Findings:               Aortogram:  This demonstrated  normal renal arteries, an ectatic and tortuous aorta and iliac segments without significant stenosis              Left Lower Extremity:  This demonstrated fairly normal common femoral artery, profunda femoris artery, and the superficial femoral artery with only mild disease.  There were 2 areas of mild disease but they only measured out in the 30 to 40% range.  The previously placed popliteal stent was widely patent.  The TP trunk and peroneal artery that was treated with angioplasty and stenting last time was now widely patent.  The peroneal artery did terminate at the ankle and only collateral blood flow was into the foot which would explain the reduced ABIs.  There was nothing further that could be done for treatment of her vascular disease as the only issues now were small vessel disease.   Disposition: Patient was taken to the recovery room in stable condition having tolerated the procedure well.  Complications: None  Lisa [name] 11/05/2023 1:24 PM   This note was created with Dragon Medical transcription system. Any errors in dictation are purely unintentional.

## 2023-11-05 NOTE — H&P (Signed)
 [facility] Admission History & Physical  MRN : [id number]  Lisa [name] is a 78 y.o. (1946/05/04) female who presents with chief complaint of No chief complaint on file. Aaron Aas  History of Present Illness: Patient returns in follow-up today for angiogram and potential treatment of known severe peripheral arterial disease.  Has recurrent rest pain to the left lower extremity.  Significant reduction in ABIs despite multiple previous interventions.  Current Facility-Administered Medications  Medication Dose Route Frequency Provider Last Rate Last Admin   0.9 %  sodium chloride  infusion   Intravenous Continuous Brown, Fallon E, NP       diphenhydrAMINE  (BENADRYL ) injection 50 mg  50 mg Intravenous Once PRN Brown, Fallon E, NP       famotidine  (PEPCID ) tablet 40 mg  40 mg Oral Once PRN Brown, Fallon E, NP       HYDROmorphone  (DILAUDID ) injection 1 mg  1 mg Intravenous Once PRN Brown, Fallon E, NP       methylPREDNISolone  sodium succinate (SOLU-MEDROL ) 125 mg/2 mL injection 125 mg  125 mg Intravenous Once PRN Brown, Fallon E, NP       midazolam  (VERSED ) 2 MG/ML syrup 8 mg  8 mg Oral Once PRN Brown, Fallon E, NP       vancomycin (VANCOCIN) IVPB 1000 mg/200 mL premix  1,000 mg Intravenous Once Annamaria Barrette, NP        Past Medical History:  Diagnosis Date   Blindness of right eye    CHF (congestive heart failure) (HCC)    Depression    Diabetes mellitus without complication (HCC)    DVT of leg (deep venous thrombosis) (HCC) 2011   Glaucoma    Grade II diastolic dysfunction    Hyperlipidemia    Hypertension    Vascular disease     Past Surgical History:  Procedure Laterality Date   ABDOMINAL HYSTERECTOMY  1992   Total   CATARACT EXTRACTION W/PHACO Left 02/25/2023   Procedure: CATARACT EXTRACTION PHACO AND INTRAOCULAR LENS PLACEMENT (IOC) LEFT DIABETIC  malyugin;  Surgeon: Annell Kidney, MD;  Location: [facility];  Service: Ophthalmology;   Laterality: Left;  10.78 0:49.1   EYE SURGERY     LOWER EXTREMITY ANGIOGRAPHY Left 09/03/2023   Procedure: Lower Extremity Angiography;  Surgeon: Celso College, MD;  Location: [facility];  Service: Cardiovascular;  Laterality: Left;   LOWER EXTREMITY INTERVENTION Left 09/03/2023   Procedure: LOWER EXTREMITY INTERVENTION;  Surgeon: Celso College, MD;  Location: [facility] INVAS[facility] CV [facility];  Service: Cardiovascular;  Laterality: Left;   TOE AMPUTATION Left 2011   All of her toes on the left foot   VASCULAR SURGERY       Social History   Tobacco Use   Smoking status: Some Days    Current packs/day: 0.25    Average packs/day: 0.3 packs/day for 40.7 years (10.2 ttl pk-yrs)    Types: Cigarettes    Start date: 03/16/2000   Smokeless tobacco: Never  Vaping Use   Vaping status: Never Used  Substance Use Topics   Alcohol  use: Yes    Comment: Wine Ocassionally   Drug use: No     Family History  Problem Relation Age of Onset   Heart disease Father    Diabetes Mother    Diabetes Brother    Kidney disease Brother        Transplant   Heart disease Brother    Diabetes Sister    Diabetes Sister  Allergies  Allergen Reactions   Other Itching    Nucinta - Hallucinations   Tapentadol Hcl Itching    Hallucinations   Actos  [Pioglitazone ]     History of CHF   Penicillins Rash     REVIEW OF SYSTEMS (Negative unless checked)  Constitutional: [] Weight loss  [] Fever  [] Chills Cardiac: [] Chest pain   [] Chest pressure   [] Palpitations   [] Shortness of breath when laying flat   [] Shortness of breath at rest   [] Shortness of breath with exertion. Vascular:  [x] Pain in legs with walking   [] Pain in legs at rest   [] Pain in legs when laying flat   [x] Claudication   [] Pain in feet when walking  [x] Pain in feet at rest  [] Pain in feet when laying flat   [] History of DVT   [] Phlebitis   [] Swelling in legs   [] Varicose veins   [] Non-healing ulcers Pulmonary:   [] Uses home oxygen    [] Productive cough   [] Hemoptysis   [] Wheeze  [] COPD   [] Asthma Neurologic:  [] Dizziness  [] Blackouts   [] Seizures   [] History of stroke   [] History of TIA  [] Aphasia   [] Temporary blindness   [] Dysphagia   [] Weakness or numbness in arms   [] Weakness or numbness in legs Musculoskeletal:  [] Arthritis   [] Joint swelling   [x] Joint pain   [] Low back pain Hematologic:  [] Easy bruising  [] Easy bleeding   [] Hypercoagulable state   [] Anemic  [] Hepatitis Gastrointestinal:  [] Blood in stool   [] Vomiting blood  [] Gastroesophageal reflux/heartburn   [] Difficulty swallowing. Genitourinary:  [] Chronic kidney disease   [] Difficult urination  [] Frequent urination  [] Burning with urination   [] Blood in urine Skin:  [] Rashes   [] Ulcers   [] Wounds Psychological:  [] History of anxiety   []  History of major depression.  Physical Examination  Vitals:   11/05/23 1130  BP: (!) 214/82  Temp: (!) 97.5 F (36.4 C)  TempSrc: Oral  SpO2: 93%  Weight: 50.8 kg  Height: 5' 4 (1.626 m)   Body mass index is 19.22 kg/m. Gen: WD/WN, NAD Head: [employer]/AT, No temporalis wasting.  Ear/Nose/Throat: Hearing grossly intact, nares w/o erythema or drainage, oropharynx w/o Erythema/Exudate,  Eyes: Conjunctiva clear, sclera non-icteric Neck: Trachea midline.  No JVD.  Pulmonary:  Good air movement, respirations not labored, no use of accessory muscles.  Cardiac: irregular Vascular:  Vessel Right Left  Radial Palpable Palpable                          PT Not Palpable Not Palpable  DP Palpable Not Palpable   Gastrointestinal: soft, non-tender/non-distended. No guarding/reflex.  Musculoskeletal: M/S 5/5 throughout.  Extremities without ischemic changes.  No deformity or atrophy.  Neurologic: Sensation grossly intact in extremities.  Symmetrical.  Speech is fluent. Motor exam as listed above. Psychiatric: Judgment intact, Mood & affect appropriate for pt's clinical situation. Dermatologic: No rashes or ulcers noted.  No  cellulitis or open wounds.      CBC Lab Results  Component Value Date   WBC 5.9 04/10/2023   HGB 10.5 (L) 04/10/2023   HCT 31.9 (L) 04/10/2023   MCV 83.9 04/10/2023   PLT 323 04/10/2023    BMET    Component Value Date/Time   NA 137 04/12/2023 0424   NA 142 09/25/2015 1620   K 4.8 04/12/2023 0424   CL 106 04/12/2023 0424   CO2 23 04/12/2023 0424   GLUCOSE 105 (H) 04/12/2023 0424   BUN 15 11/05/2023  1124   BUN 8 09/25/2015 1620   CREATININE 0.74 11/05/2023 1124   CREATININE 0.77 02/03/2023 1352   CALCIUM  8.5 (L) 04/12/2023 0424   GFRNONAA >60 11/05/2023 1124   GFRNONAA 64 02/17/2020 1456   GFRAA 74 02/17/2020 1456   Estimated Creatinine Clearance: 46.5 mL/min (by C-G formula based on SCr of 0.74 mg/dL).  COAG Lab Results  Component Value Date   INR 1.2 04/08/2023   INR 1.0 02/26/2021   INR 1.0 02/25/2021    Radiology No results found.   Assessment/Plan DM (diabetes mellitus), type 2 with peripheral vascular complications (HCC) blood glucose control important in reducing the progression of atherosclerotic disease. Also, involved in wound healing. On appropriate medications.     Benign essential HTN blood pressure control important in reducing the progression of atherosclerotic disease. On appropriate oral medications.     Dyslipidemia lipid control important in reducing the progression of atherosclerotic disease. Continue statin therapy     Atherosclerosis of native arteries of extremity with rest pain [facility]) Patient has continued rest pain in the left lower extremity despite previous angiogram and intervention earlier this year.  Her ABI remains reduced.  She has very limited one-vessel runoff.  I have recommended angiography with further evaluation and potential intervention.  Risks and benefits are discussed and she is agreeable to proceed.  Mikki Alexander, MD  11/05/2023 11:59 AM

## 2023-11-06 ENCOUNTER — Encounter: Payer: Self-pay | Admitting: Vascular Surgery

## 2023-11-19 ENCOUNTER — Other Ambulatory Visit: Payer: Self-pay | Admitting: Family Medicine

## 2023-11-19 DIAGNOSIS — I739 Peripheral vascular disease, unspecified: Secondary | ICD-10-CM

## 2023-12-17 ENCOUNTER — Ambulatory Visit (INDEPENDENT_AMBULATORY_CARE_PROVIDER_SITE_OTHER): Admitting: Nurse Practitioner

## 2023-12-25 ENCOUNTER — Other Ambulatory Visit: Payer: Self-pay | Admitting: Family Medicine

## 2023-12-25 ENCOUNTER — Telehealth: Payer: Self-pay

## 2023-12-25 MED ORDER — DAPAGLIFLOZIN PROPANEDIOL 10 MG PO TABS: ORAL_TABLET | ORAL | 0 refills | Status: DC

## 2023-12-25 NOTE — Telephone Encounter (Unsigned)
 Copied from CRM [id number]. Topic: Clinical - Medication Refill >> Dec 25, 2023 10:51 AM Emylou G wrote: Medication: dapagliflozin  propanediol (FARXIGA ) 10 MG TABS tablet  Has the patient contacted their pharmacy? Yes (Agent: If no, request that the patient contact the pharmacy for the refill. If patient does not wish to contact the pharmacy document the reason why and proceed with request.) (Agent: If yes, when and what did the pharmacy advise?) said to call us   This is the patient's preferred pharmacy:  [id number] [place], [place] - [address]  [place] [address] Phone: [phone] Fax: [phone]  Is this the correct pharmacy for this prescription? Yes If no, delete pharmacy and type the correct one.   Has the prescription been filled recently? No  Is the patient out of the medication? Yes  Has the patient been seen for an appointment in the last year OR does the patient have an upcoming appointment? Yes  Can we respond through MyChart? No  Agent: Please be advised that Rx refills may take up to 3 business days. We ask that you follow-up with your pharmacy.

## 2023-12-25 NOTE — Telephone Encounter (Signed)
 Copied from CRM [id number]. Topic: Appointments - Appointment Scheduling >> Dec 25, 2023 10:55 AM Emylou G wrote: Patient called - has concerns that she won't be able to get refill before the weekend. I did adv of the turnaround time.. Can we do samples until its refilled and I did schedule appt

## 2023-12-25 NOTE — Telephone Encounter (Signed)
 Called patient to let her know 30 day supply given. [facility] no answer left detailed vm to patient.

## 2023-12-25 NOTE — Telephone Encounter (Signed)
 Requested by patient . Duplicate request. Last refill ordered today 12/25/23 # 30 0 refills at 3:29 pm Requested Prescriptions  Refused Prescriptions Disp Refills   dapagliflozin  propanediol (FARXIGA ) 10 MG TABS tablet 90 tablet 1    Sig: TAKE 1 TABLET BY MOUTH EVERY DAY BEFORE BREAKFAST     Endocrinology:  Diabetes - SGLT2 Inhibitors Failed - 12/25/2023  3:36 PM      Failed - HBA1C is between 0 and 7.9 and within 180 days    Hemoglobin A1C  Date Value Ref Range Status  01/30/2023 6.4 (A) 4.0 - 5.6 % Final   HbA1c, POC (prediabetic range)  Date Value Ref Range Status  08/16/2019 6.5 (A) 5.7 - 6.4 % Final   Hgb A1c MFr Bld  Date Value Ref Range Status  02/17/2020 8.5 (H) <5.7 % of total Hgb Final    Comment:    For someone without known diabetes, a hemoglobin A1c value of 6.5% or greater indicates that they may have  diabetes and this should be confirmed with a follow-up  test. . For someone with known diabetes, a value <7% indicates  that their diabetes is well controlled and a value  greater than or equal to 7% indicates suboptimal  control. A1c targets should be individualized based on  duration of diabetes, age, comorbid conditions, and  other considerations. . Currently, no consensus exists regarding use of hemoglobin A1c for diagnosis of diabetes for children. .          Failed - Valid encounter within last 6 months    Recent Outpatient Visits           4 months ago Left foot pain   [facility] [place], [place]              Passed - Cr in normal range and within 360 days    Creat  Date Value Ref Range Status  02/03/2023 0.77 0.60 - 1.00 mg/dL Final   Creatinine, Ser  Date Value Ref Range Status  11/05/2023 0.74 0.44 - 1.00 mg/dL Final   Creatinine, Urine  Date Value Ref Range Status  02/19/2022 36 20 - 275 mg/dL Final         Passed - eGFR in normal range and within 360 days    GFR, Est African American  Date Value  Ref Range Status  02/17/2020 74 > OR = 60 mL/min/1.34m2 Final   GFR, Est Non African American  Date Value Ref Range Status  02/17/2020 64 > OR = 60 mL/min/1.54m2 Final   GFR, Estimated  Date Value Ref Range Status  11/05/2023 >60 >60 mL/min Final    Comment:    (NOTE) Calculated using the CKD-EPI Creatinine Equation (2021) Performed at [facility], [address] [place], [place] [address]    eGFR  Date Value Ref Range Status  02/03/2023 79 > OR = 60 mL/min/1.108m2 Final

## 2024-02-02 ENCOUNTER — Ambulatory Visit (INDEPENDENT_AMBULATORY_CARE_PROVIDER_SITE_OTHER): Admitting: Family Medicine

## 2024-02-02 ENCOUNTER — Encounter: Payer: Self-pay | Admitting: Family Medicine

## 2024-02-02 VITALS — BP 176/90 | HR 66 | Resp 18 | Ht 64.0 in | Wt 117.5 lb

## 2024-02-02 DIAGNOSIS — I152 Hypertension secondary to endocrine disorders: Secondary | ICD-10-CM | POA: Diagnosis not present

## 2024-02-02 DIAGNOSIS — G547 Phantom limb syndrome without pain: Secondary | ICD-10-CM

## 2024-02-02 DIAGNOSIS — H548 Legal blindness, as defined in USA: Secondary | ICD-10-CM

## 2024-02-02 DIAGNOSIS — F330 Major depressive disorder, recurrent, mild: Secondary | ICD-10-CM

## 2024-02-02 DIAGNOSIS — N3944 Nocturnal enuresis: Secondary | ICD-10-CM | POA: Diagnosis not present

## 2024-02-02 DIAGNOSIS — R6889 Other general symptoms and signs: Secondary | ICD-10-CM

## 2024-02-02 DIAGNOSIS — I739 Peripheral vascular disease, unspecified: Secondary | ICD-10-CM

## 2024-02-02 DIAGNOSIS — S98912S Complete traumatic amputation of left foot, level unspecified, sequela: Secondary | ICD-10-CM

## 2024-02-02 DIAGNOSIS — R159 Full incontinence of feces: Secondary | ICD-10-CM | POA: Diagnosis not present

## 2024-02-02 DIAGNOSIS — S98912D Complete traumatic amputation of left foot, level unspecified, subsequent encounter: Secondary | ICD-10-CM

## 2024-02-02 DIAGNOSIS — I5032 Chronic diastolic (congestive) heart failure: Secondary | ICD-10-CM

## 2024-02-02 DIAGNOSIS — E1159 Type 2 diabetes mellitus with other circulatory complications: Secondary | ICD-10-CM

## 2024-02-02 DIAGNOSIS — E538 Deficiency of other specified B group vitamins: Secondary | ICD-10-CM

## 2024-02-02 DIAGNOSIS — E559 Vitamin D deficiency, unspecified: Secondary | ICD-10-CM

## 2024-02-02 LAB — POCT GLYCOSYLATED HEMOGLOBIN (HGB A1C): Hemoglobin A1C: 6.4 % — AB (ref 4.0–5.6)

## 2024-02-02 MED ORDER — VITAMIN D (ERGOCALCIFEROL) 1.25 MG (50000 UNIT) PO CAPS: 50000.0000 [IU] | ORAL_CAPSULE | ORAL | 1 refills | Status: AC

## 2024-02-02 MED ORDER — ROSUVASTATIN CALCIUM 10 MG PO TABS: 10.0000 mg | ORAL_TABLET | Freq: Every day | ORAL | 1 refills | Status: AC

## 2024-02-02 MED ORDER — LOSARTAN POTASSIUM 100 MG PO TABS: 100.0000 mg | ORAL_TABLET | Freq: Every day | ORAL | 1 refills | Status: AC

## 2024-02-02 MED ORDER — CITALOPRAM HYDROBROMIDE 10 MG PO TABS: 10.0000 mg | ORAL_TABLET | Freq: Every day | ORAL | 1 refills | Status: AC

## 2024-02-02 MED ORDER — AMLODIPINE BESYLATE 10 MG PO TABS: 10.0000 mg | ORAL_TABLET | Freq: Every day | ORAL | 1 refills | Status: AC

## 2024-02-02 MED ORDER — B-12 1000 MCG SL SUBL: 1.0000 | SUBLINGUAL_TABLET | Freq: Every day | SUBLINGUAL | 1 refills | Status: DC

## 2024-02-02 MED ORDER — DAPAGLIFLOZIN PROPANEDIOL 10 MG PO TABS: 10.0000 mg | ORAL_TABLET | Freq: Every day | ORAL | 1 refills | Status: AC

## 2024-02-02 MED ORDER — METOPROLOL SUCCINATE ER 25 MG PO TB24: 25.0000 mg | ORAL_TABLET | Freq: Every day | ORAL | 1 refills | Status: DC

## 2024-02-02 NOTE — Progress Notes (Signed)
 Name: Lisa [name]   MRN: [id number]    DOB: 1946-02-09   Date:02/02/2024       Progress Note  Subjective  Chief Complaint  Chief Complaint  Patient presents with   Medical Management of Chronic Issues   Discussed the use of AI scribe software for clinical note transcription with the patient, who gave verbal consent to proceed.  History of Present Illness Lisa [name] is a 78 year old female with diabetes, hypertension, and peripheral arterial disease who presents for follow-up and medication management.  She feels unwell most days, with only a couple of days per week where she feels okay. She lives alone now, which she is not thrilled about, as her brother, who previously lived with her, had issues with alcoholism.  Her most recent A1c was 6.4%. She experiences constant hunger, often snacking throughout the day, and sometimes eating a lot at one meal. She is currently taking Farxiga  daily. She is not taking Januvia  anymore.  She has a history of vitamin deficiencies, including vitamin A , D, B12, and zinc . She is currently out of vitamin D  and B12 supplements and has never taken zinc . She has been out of vitamin D  and B12 for about a week or two.  She reports issues with bowel and bladder incontinence since 2022, leading to frequent accidents, especially at night. She has seen a urologist and had a cystoscopy but remains dissatisfied with the management of these issues. She wants to see another specialist for further evaluation.  Her blood pressure was measured at 160/94 mmHg. She has been out of amlodipine  since April and is currently taking metoprolol  25 mg and losartan  50 mg. She also takes clopidogrel  for peripheral arterial disease.  She has a history of chronic diastolic heart failure but denies shortness of breath. She experiences swelling in her legs if something is too tight. She is taking Farxiga  for heart failure management.  She reports depression and  frustration, feeling 'depressed' and 'not well'. She is taking citalopram  10 mg daily. She mentions recent forgetfulness and losing personal items, which she attributes to her depression.  She has noticed three itchy spots on her left leg, which she describes as similar to insect bites, though she denies being bitten by insects recently.  She is legally blind and has not driven since August. She mentions needing a referral to a podiatrist for toenail care.    Patient Active Problem List   Diagnosis Date Noted   Hypokalemia 04/08/2023   Acute deep vein thrombosis (DVT) of left upper extremity (HCC) 04/08/2023   Hypertensive urgency 04/08/2023   Type 2 diabetes mellitus without complications (HCC) 04/08/2023   Hypokalemia due to excessive gastrointestinal loss of potassium 04/08/2023   Malnutrition of moderate degree 04/08/2023   Unilateral amputation of left foot (HCC) 02/19/2022   Hypertension associated with type 2 diabetes mellitus (HCC) 02/19/2022   Chronic diastolic heart failure (HCC) 04/09/2021   Nicotine  dependence 04/09/2021   Syncope and collapse 02/26/2021   Prolonged QT interval 02/26/2021   DM (diabetes mellitus), type 2 with peripheral vascular complications (HCC) 09/25/2015   Amputation of left foot (HCC) 12/02/2014   Benign essential HTN 12/02/2014   Depression, major, recurrent, mild (HCC) 12/02/2014   Dyslipidemia 12/02/2014   Vitamin B12 deficiency 12/02/2014   Peripheral neuropathic pain 12/02/2014   Atherosclerosis of native arteries of extremity with rest pain (HCC) 12/02/2014   Phantom limb (HCC) 12/02/2014   Diabetes mellitus with nephropathy (HCC) 12/02/2014   Detached retina  12/02/2014   Type 2 diabetes mellitus with peripheral neuropathy (HCC) 12/02/2014   Tobacco use 12/02/2014   Urge incontinence 12/02/2014   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   Vitamin D  deficiency 12/02/2014    Past Surgical History:  Procedure Laterality Date   ABDOMINAL HYSTERECTOMY   1992   Total   CATARACT EXTRACTION W/PHACO Left 02/25/2023   Procedure: CATARACT EXTRACTION PHACO AND INTRAOCULAR LENS PLACEMENT (IOC) LEFT DIABETIC  malyugin;  Surgeon: Mittie Gaskin, MD;  Location: [facility];  Service: Ophthalmology;  Laterality: Left;  10.78 0:49.1   EYE SURGERY     LOWER EXTREMITY ANGIOGRAPHY Left 09/03/2023   Procedure: Lower Extremity Angiography;  Surgeon: Marea Selinda RAMAN, MD;  Location: [facility];  Service: Cardiovascular;  Laterality: Left;   LOWER EXTREMITY ANGIOGRAPHY Left 11/05/2023   Procedure: Lower Extremity Angiography;  Surgeon: Marea Selinda RAMAN, MD;  Location: [facility];  Service: Cardiovascular;  Laterality: Left;   LOWER EXTREMITY INTERVENTION Left 09/03/2023   Procedure: LOWER EXTREMITY INTERVENTION;  Surgeon: Marea Selinda RAMAN, MD;  Location: [facility];  Service: Cardiovascular;  Laterality: Left;   LOWER EXTREMITY INTERVENTION Left 11/05/2023   Procedure: LOWER EXTREMITY INTERVENTION;  Surgeon: Marea Selinda RAMAN, MD;  Location: [facility];  Service: Cardiovascular;  Laterality: Left;   TOE AMPUTATION Left 2011   All of her toes on the left foot   VASCULAR SURGERY      Family History  Problem Relation Age of Onset   Heart disease Father    Diabetes Mother    Diabetes Brother    Kidney disease Brother        Transplant   Heart disease Brother    Diabetes Sister    Diabetes Sister     Social History   Tobacco Use   Smoking status: Some Days    Current packs/day: 0.25    Average packs/day: 0.2 packs/day for 41.0 years (10.2 ttl pk-yrs)    Types: Cigarettes    Start date: 03/16/2000   Smokeless tobacco: Never  Substance Use Topics   Alcohol  use: Yes    Comment: Wine Ocassionally     Current Outpatient Medications:    acetaminophen  (TYLENOL ) 500 MG tablet, Take 2 tablets by mouth every 6 (six) hours as needed., Disp: , Rfl:    aspirin  81 MG tablet, Take 81 mg by mouth daily., Disp: , Rfl:     atropine  1 % ophthalmic solution, Place 1 drop into the left eye 3 (three) times daily., Disp: , Rfl:    brimonidine  (ALPHAGAN ) 0.2 % ophthalmic solution, Place 1 drop into both eyes 2 (two) times daily., Disp: , Rfl: 3   clopidogrel  (PLAVIX ) 75 MG tablet, Take 1 tablet (75 mg total) by mouth daily., Disp: 30 tablet, Rfl: 11   dorzolamide -timolol  (COSOPT ) 22.3-6.8 MG/ML ophthalmic solution, Place 1 drop into both eyes 2 (two) times daily., Disp: , Rfl:    feeding supplement (ENSURE ENLIVE / ENSURE PLUS) LIQD, Take 237 mLs by mouth 2 (two) times daily between meals., Disp: , Rfl:    gabapentin  (NEURONTIN ) 100 MG capsule, Take 1 capsule (100 mg total) by mouth 3 (three) times daily., Disp: 90 capsule, Rfl: 2   losartan  (COZAAR ) 100 MG tablet, Take 1 tablet (100 mg total) by mouth daily., Disp: 90 tablet, Rfl: 1   Multiple Vitamin (MULTIVITAMIN) tablet, Take 1 tablet by mouth daily., Disp: , Rfl:    nitroGLYCERIN  (NITROSTAT ) 0.4 MG SL tablet, Place 1 tablet (0.4  mg total) under the tongue every 5 (five) minutes as needed for chest pain., Disp: 15 tablet, Rfl: 12   polyvinyl alcohol  (LIQUIFILM TEARS) 1.4 % ophthalmic solution, Place 2 drops into the right eye as needed for dry eyes., Disp: , Rfl:    prednisoLONE  acetate (PRED FORTE ) 1 % ophthalmic suspension, Place 1 drop into the left eye 4 (four) times daily., Disp: , Rfl:    traMADol  (ULTRAM ) 50 MG tablet, Take 1 tablet (50 mg total) by mouth every 6 (six) hours as needed., Disp: 20 tablet, Rfl: 0   Vibegron  (GEMTESA ) 75 MG TABS, Take 75 mg by mouth daily., Disp: 30 tablet, Rfl: 11   amLODipine  (NORVASC ) 10 MG tablet, Take 1 tablet (10 mg total) by mouth daily., Disp: 90 tablet, Rfl: 1   citalopram  (CELEXA ) 10 MG tablet, Take 1 tablet (10 mg total) by mouth daily., Disp: 90 tablet, Rfl: 1   Cyanocobalamin  (B-12) 1000 MCG SUBL, Place 1 tablet under the tongue daily., Disp: 90 tablet, Rfl: 1   dapagliflozin  propanediol (FARXIGA ) 10 MG TABS tablet, Take  1 tablet (10 mg total) by mouth daily. TAKE 1 TABLET BY MOUTH EVERY DAY BEFORE BREAKFAST, Disp: 90 tablet, Rfl: 1   metoprolol  succinate (TOPROL  XL) 25 MG 24 hr tablet, Take 1 tablet (25 mg total) by mouth daily. Take with or immediately following a meal., Disp: 90 tablet, Rfl: 1   rosuvastatin  (CRESTOR ) 10 MG tablet, Take 1 tablet (10 mg total) by mouth daily., Disp: 90 tablet, Rfl: 1   Vitamin D , Ergocalciferol , (DRISDOL ) 1.25 MG (50000 UNIT) CAPS capsule, Take 1 capsule (50,000 Units total) by mouth every 7 (seven) days., Disp: 8 capsule, Rfl: 1  Allergies  Allergen Reactions   Other Itching    Nucinta - Hallucinations   Tapentadol Hcl Itching    Hallucinations   Actos  [Pioglitazone ]     History of CHF   Penicillins Rash    I personally reviewed active problem list, medication list, allergies with the patient/caregiver today.   ROS  Ten systems reviewed and is negative except as mentioned in HPI    Objective Physical Exam  CONSTITUTIONAL: Patient appears well-developed and well-nourished. No distress. HEENT: Head atraumatic, normocephalic, neck supple. CARDIOVASCULAR: Normal rate, regular rhythm and normal heart sounds. No murmur heard. Decreased pulse in extremities. No BLE edema. PULMONARY: Effort normal and breath sounds normal. Lungs clear to auscultation bilaterally. No respiratory distress. MUSCULOSKELETAL: slow gait, using cane  PSYCHIATRIC: Patient has a normal mood and affect. Behavior is normal. Judgment and thought content normal.     Vitals:   02/02/24 1347 02/02/24 1443  BP: (!) 160/94 (!) 176/90  Pulse: 66   Resp: 18   Weight: 117 lb 8 oz (53.3 kg)   Height: 5' 4 (1.626 m)     Body mass index is 20.17 kg/m.  Recent Results (from the past 2160 hours)  BUN     Status: None   Collection Time: 11/05/23 11:24 AM  Result Value Ref Range   BUN 15 8 - 23 mg/dL    Comment: Performed at [facility], [address] [place], [place] [address]   Creatinine, serum     Status: None   Collection Time: 11/05/23 11:24 AM  Result Value Ref Range   Creatinine, Ser 0.74 0.44 - 1.00 mg/dL   GFR, Estimated >39 >39 mL/min    Comment: (NOTE) Calculated using the CKD-EPI Creatinine Equation (2021) Performed at [facility], [address], [place], [place]  [address]   Glucose, capillary     Status: Abnormal   Collection Time: 11/05/23 11:25 AM  Result Value Ref Range   Glucose-Capillary 118 (H) 70 - 99 mg/dL    Comment: Glucose reference range applies only to samples taken after fasting for at least 8 hours.  POCT glycosylated hemoglobin (Hb A1C)     Status: Abnormal   Collection Time: 02/02/24  2:02 PM  Result Value Ref Range   Hemoglobin A1C 6.4 (A) 4.0 - 5.6 %   HbA1c POC (<> result, manual entry)     HbA1c, POC (prediabetic range)     HbA1c, POC (controlled diabetic range)      Diabetic Foot Exam:  Diabetic foot exam was performed with the following findings:   Normal sensation of 10g monofilament Decrease PD on right foot, long nails on right foot, distal foot amputation on left side       PHQ2/9:    02/02/2024    1:38 PM 02/23/2023    1:18 PM 01/30/2023    1:09 PM 09/29/2022    1:53 PM 03/19/2022    9:09 AM  Depression screen PHQ 2/9  Decreased Interest 1 0 0 1 2  Down, Depressed, Hopeless 1 0 0 1 2  PHQ - 2 Score 2 0 0 2 4  Altered sleeping 0  0 0 0  Tired, decreased energy 0  1 3   Change in appetite 0  0 0 0  Feeling bad or failure about yourself  0  0 0 0  Trouble concentrating 1  1 0 1  Moving slowly or fidgety/restless 0  1 0 1  Suicidal thoughts 0  0 0 0  PHQ-9 Score 3  3 5 6   Difficult doing work/chores Somewhat difficult        phq 9 is positive  Fall Risk:    02/02/2024    1:37 PM 02/23/2023    1:17 PM 01/30/2023    1:09 PM 09/29/2022    1:52 PM 02/19/2022   10:30 AM  Fall Risk   Falls in the past year? 1 0 0 0 1  Number falls in past yr: 1 0 0 0 0  Injury with Fall? 0 0 0 0   Risk for fall  due to : Impaired balance/gait No Fall Risks;Impaired vision Impaired balance/gait No Fall Risks Impaired vision;History of fall(s)  Follow up Falls evaluation completed Falls prevention discussed Falls prevention discussed Falls prevention discussed Education provided;Falls evaluation completed;Falls prevention discussed      Data saved with a previous flowsheet row definition      Assessment & Plan Type 2 diabetes mellitus with circulatory complications, HTN, dyslipidemia Type 2 diabetes mellitus is well-controlled with an A1c of 6.4%. - Continue Farxiga . - Discontinue Januvia .  Chronic diastolic heart failure Mild swelling noted. - Continue losartan  50 mg. - Continue metoprolol  25 mg. - Continue Farxiga .  Hypertension Hypertension is uncontrolled with a blood pressure of 160/94 mmHg, possibly due to being out of amlodipine . - Restart amlodipine . - Continue losartan  50 mg. - Continue metoprolol  25 mg.  Peripheral vascular disease with left foot amputation and phantom limb syndrome Peripheral vascular disease with left foot amputation. Phantom limb pain has decreased. New skin lesions on the left leg, likely insect bites. - Continue clopidogrel . - Continue rosuvastatin . - Use hydrocortisone cream on skin lesions. - Refer to podiatrist for foot care.  Fecal and urinary incontinence with nocturnal enuresis Chronic fecal and urinary incontinence with nocturnal enuresis persists. Significant  distress expressed. - Refer to gastroenterologist at [facility] for further evaluation.  Depression Major recurrent  Chronic depression with recent exacerbation due to social isolation and recent losses. Reports feeling overwhelmed and forgetful. - Continue citalopram  10 mg. - Refer to therapist for counseling.  Vitamin D , B12, and A deficiencies Vitamin D , B12, and A deficiencies. Currently out of vitamin D  and B12 supplements. - Order vitamin D  and B12 supplements. - Check vitamin  D, B12, and A levels. - Recommend multivitamin containing zinc  and vitamin A .  Legal blindness Legal blindness significantly impacts daily activities.

## 2024-02-03 ENCOUNTER — Ambulatory Visit: Payer: Self-pay | Admitting: Family Medicine

## 2024-02-03 LAB — CBC WITH DIFFERENTIAL/PLATELET
Absolute Lymphocytes: 872 {cells}/uL (ref 850–3900)
Absolute Monocytes: 439 {cells}/uL (ref 200–950)
Basophils Absolute: 18 {cells}/uL (ref 0–200)
Basophils Relative: 0.3 %
Eosinophils Absolute: 79 {cells}/uL (ref 15–500)
Eosinophils Relative: 1.3 %
HCT: 41.7 % (ref 35.0–45.0)
Hemoglobin: 13.1 g/dL (ref 11.7–15.5)
MCH: 26.6 pg — ABNORMAL LOW (ref 27.0–33.0)
MCHC: 31.4 g/dL — ABNORMAL LOW (ref 32.0–36.0)
MCV: 84.6 fL (ref 80.0–100.0)
MPV: 12 fL (ref 7.5–12.5)
Monocytes Relative: 7.2 %
Neutro Abs: 4691 {cells}/uL (ref 1500–7800)
Neutrophils Relative %: 76.9 %
Platelets: 294 Thousand/uL (ref 140–400)
RBC: 4.93 Million/uL (ref 3.80–5.10)
RDW: 16.5 % — ABNORMAL HIGH (ref 11.0–15.0)
Total Lymphocyte: 14.3 %
WBC: 6.1 Thousand/uL (ref 3.8–10.8)

## 2024-02-03 LAB — COMPREHENSIVE METABOLIC PANEL WITH GFR
AG Ratio: 1.5 (calc) (ref 1.0–2.5)
ALT: 11 U/L (ref 6–29)
AST: 16 U/L (ref 10–35)
Albumin: 3.6 g/dL (ref 3.6–5.1)
Alkaline phosphatase (APISO): 69 U/L (ref 37–153)
BUN/Creatinine Ratio: 18 (calc) (ref 6–22)
BUN: 10 mg/dL (ref 7–25)
CO2: 35 mmol/L — ABNORMAL HIGH (ref 20–32)
Calcium: 8.6 mg/dL (ref 8.6–10.4)
Chloride: 101 mmol/L (ref 98–110)
Creat: 0.57 mg/dL — ABNORMAL LOW (ref 0.60–1.00)
Globulin: 2.4 g/dL (ref 1.9–3.7)
Glucose, Bld: 117 mg/dL — ABNORMAL HIGH (ref 65–99)
Potassium: 3.1 mmol/L — ABNORMAL LOW (ref 3.5–5.3)
Sodium: 144 mmol/L (ref 135–146)
Total Bilirubin: 0.7 mg/dL (ref 0.2–1.2)
Total Protein: 6 g/dL — ABNORMAL LOW (ref 6.1–8.1)
eGFR: 93 mL/min/1.73m2 (ref >=60)

## 2024-02-03 LAB — MICROALBUMIN / CREATININE URINE RATIO
Creatinine, Urine: 33 mg/dL (ref 20–275)
Microalb Creat Ratio: 91 mg/g{creat} — ABNORMAL HIGH (ref <30)
Microalb, Ur: 3 mg/dL

## 2024-02-03 LAB — TSH: TSH: 2.35 m[IU]/L (ref 0.40–4.50)

## 2024-02-03 LAB — LIPID PANEL
Cholesterol: 188 mg/dL (ref <200)
HDL: 77 mg/dL (ref >=50)
LDL Cholesterol (Calc): 90 mg/dL
Non-HDL Cholesterol (Calc): 111 mg/dL (ref <130)
Total CHOL/HDL Ratio: 2.4 (calc) (ref <5.0)
Triglycerides: 109 mg/dL (ref <150)

## 2024-02-03 LAB — EXTRA

## 2024-02-03 LAB — VITAMIN D 25 HYDROXY (VIT D DEFICIENCY, FRACTURES): Vit D, 25-Hydroxy: 38 ng/mL (ref 30–100)

## 2024-02-03 LAB — VITAMIN B12: Vitamin B-12: 657 pg/mL (ref 200–1100)

## 2024-02-03 MED ORDER — POTASSIUM CHLORIDE CRYS ER 20 MEQ PO TBCR: 20.0000 meq | EXTENDED_RELEASE_TABLET | Freq: Every day | ORAL | 0 refills | Status: DC

## 2024-03-06 ENCOUNTER — Other Ambulatory Visit: Payer: Self-pay | Admitting: Family Medicine

## 2024-03-06 DIAGNOSIS — E1159 Type 2 diabetes mellitus with other circulatory complications: Secondary | ICD-10-CM

## 2024-03-22 ENCOUNTER — Inpatient Hospital Stay
Admission: EM | Admit: 2024-03-22 | Discharge: 2024-03-29 | DRG: 392 | Disposition: A | Discharge status: Home Health | Attending: Internal Medicine | Admitting: Internal Medicine

## 2024-03-22 DIAGNOSIS — Z9842 Cataract extraction status, left eye: Secondary | ICD-10-CM

## 2024-03-22 DIAGNOSIS — F419 Anxiety disorder, unspecified: Secondary | ICD-10-CM | POA: Diagnosis present

## 2024-03-22 DIAGNOSIS — D509 Iron deficiency anemia, unspecified: Secondary | ICD-10-CM | POA: Diagnosis present

## 2024-03-22 DIAGNOSIS — R531 Weakness: Secondary | ICD-10-CM

## 2024-03-22 DIAGNOSIS — Z89422 Acquired absence of other left toe(s): Secondary | ICD-10-CM

## 2024-03-22 DIAGNOSIS — I5032 Chronic diastolic (congestive) heart failure: Secondary | ICD-10-CM | POA: Diagnosis present

## 2024-03-22 DIAGNOSIS — Z90710 Acquired absence of both cervix and uterus: Secondary | ICD-10-CM

## 2024-03-22 DIAGNOSIS — E1140 Type 2 diabetes mellitus with diabetic neuropathy, unspecified: Secondary | ICD-10-CM | POA: Diagnosis not present

## 2024-03-22 DIAGNOSIS — Z8249 Family history of ischemic heart disease and other diseases of the circulatory system: Secondary | ICD-10-CM | POA: Diagnosis not present

## 2024-03-22 DIAGNOSIS — Z7902 Long term (current) use of antithrombotics/antiplatelets: Secondary | ICD-10-CM

## 2024-03-22 DIAGNOSIS — I110 Hypertensive heart disease with heart failure: Secondary | ICD-10-CM | POA: Diagnosis present

## 2024-03-22 DIAGNOSIS — K449 Diaphragmatic hernia without obstruction or gangrene: Secondary | ICD-10-CM | POA: Diagnosis present

## 2024-03-22 DIAGNOSIS — Z89412 Acquired absence of left great toe: Secondary | ICD-10-CM

## 2024-03-22 DIAGNOSIS — Z79899 Other long term (current) drug therapy: Secondary | ICD-10-CM

## 2024-03-22 DIAGNOSIS — Z7982 Long term (current) use of aspirin: Secondary | ICD-10-CM

## 2024-03-22 DIAGNOSIS — R0989 Other specified symptoms and signs involving the circulatory and respiratory systems: Secondary | ICD-10-CM | POA: Diagnosis not present

## 2024-03-22 DIAGNOSIS — J449 Chronic obstructive pulmonary disease, unspecified: Secondary | ICD-10-CM | POA: Diagnosis present

## 2024-03-22 DIAGNOSIS — E1151 Type 2 diabetes mellitus with diabetic peripheral angiopathy without gangrene: Secondary | ICD-10-CM | POA: Diagnosis present

## 2024-03-22 DIAGNOSIS — R001 Bradycardia, unspecified: Secondary | ICD-10-CM | POA: Diagnosis present

## 2024-03-22 DIAGNOSIS — E1142 Type 2 diabetes mellitus with diabetic polyneuropathy: Secondary | ICD-10-CM | POA: Diagnosis present

## 2024-03-22 DIAGNOSIS — D649 Anemia, unspecified: Secondary | ICD-10-CM

## 2024-03-22 DIAGNOSIS — Z880 Allergy status to penicillin: Secondary | ICD-10-CM

## 2024-03-22 DIAGNOSIS — Z833 Family history of diabetes mellitus: Secondary | ICD-10-CM

## 2024-03-22 DIAGNOSIS — R197 Diarrhea, unspecified: Principal | ICD-10-CM

## 2024-03-22 DIAGNOSIS — Z87891 Personal history of nicotine dependence: Secondary | ICD-10-CM

## 2024-03-22 DIAGNOSIS — I517 Cardiomegaly: Secondary | ICD-10-CM | POA: Diagnosis not present

## 2024-03-22 DIAGNOSIS — Z8419 Family history of other disorders of kidney and ureter: Secondary | ICD-10-CM

## 2024-03-22 DIAGNOSIS — H42 Glaucoma in diseases classified elsewhere: Secondary | ICD-10-CM | POA: Diagnosis present

## 2024-03-22 DIAGNOSIS — E876 Hypokalemia: Secondary | ICD-10-CM | POA: Diagnosis present

## 2024-03-22 DIAGNOSIS — Z86718 Personal history of other venous thrombosis and embolism: Secondary | ICD-10-CM

## 2024-03-22 DIAGNOSIS — I77819 Aortic ectasia, unspecified site: Secondary | ICD-10-CM | POA: Diagnosis not present

## 2024-03-22 DIAGNOSIS — R32 Unspecified urinary incontinence: Secondary | ICD-10-CM | POA: Diagnosis present

## 2024-03-22 DIAGNOSIS — Z961 Presence of intraocular lens: Secondary | ICD-10-CM | POA: Diagnosis present

## 2024-03-22 DIAGNOSIS — I160 Hypertensive urgency: Secondary | ICD-10-CM | POA: Diagnosis present

## 2024-03-22 DIAGNOSIS — R0600 Dyspnea, unspecified: Secondary | ICD-10-CM | POA: Diagnosis not present

## 2024-03-22 DIAGNOSIS — E1139 Type 2 diabetes mellitus with other diabetic ophthalmic complication: Secondary | ICD-10-CM | POA: Diagnosis present

## 2024-03-22 DIAGNOSIS — K529 Noninfective gastroenteritis and colitis, unspecified: Principal | ICD-10-CM

## 2024-03-22 DIAGNOSIS — Z888 Allergy status to other drugs, medicaments and biological substances status: Secondary | ICD-10-CM | POA: Diagnosis not present

## 2024-03-22 DIAGNOSIS — F32A Depression, unspecified: Secondary | ICD-10-CM | POA: Diagnosis present

## 2024-03-22 DIAGNOSIS — E785 Hyperlipidemia, unspecified: Secondary | ICD-10-CM | POA: Diagnosis present

## 2024-03-22 DIAGNOSIS — I10 Essential (primary) hypertension: Secondary | ICD-10-CM | POA: Diagnosis present

## 2024-03-22 DIAGNOSIS — R159 Full incontinence of feces: Secondary | ICD-10-CM | POA: Diagnosis present

## 2024-03-22 DIAGNOSIS — Z66 Do not resuscitate: Secondary | ICD-10-CM | POA: Diagnosis present

## 2024-03-22 DIAGNOSIS — R1084 Generalized abdominal pain: Secondary | ICD-10-CM | POA: Diagnosis not present

## 2024-03-22 DIAGNOSIS — H5461 Unqualified visual loss, right eye, normal vision left eye: Secondary | ICD-10-CM | POA: Diagnosis present

## 2024-03-22 DIAGNOSIS — F17210 Nicotine dependence, cigarettes, uncomplicated: Secondary | ICD-10-CM | POA: Diagnosis not present

## 2024-03-22 DIAGNOSIS — N39498 Other specified urinary incontinence: Secondary | ICD-10-CM | POA: Diagnosis not present

## 2024-03-22 LAB — CBC
HCT: 35.7 % — ABNORMAL LOW (ref 36.0–46.0)
Hemoglobin: 11.4 g/dL — ABNORMAL LOW (ref 12.0–15.0)
MCH: 26.9 pg (ref 26.0–34.0)
MCHC: 31.9 g/dL (ref 30.0–36.0)
MCV: 84.2 fL (ref 80.0–100.0)
Platelets: 297 K/uL (ref 150–400)
RBC: 4.24 MIL/uL (ref 3.87–5.11)
RDW: 17.9 % — ABNORMAL HIGH (ref 11.5–15.5)
WBC: 8.4 K/uL (ref 4.0–10.5)
nRBC: 0 % (ref 0.0–0.2)

## 2024-03-22 LAB — COMPREHENSIVE METABOLIC PANEL WITH GFR
ALT: 19 U/L (ref 0–44)
AST: 23 U/L (ref 15–41)
Albumin: 2.9 g/dL — ABNORMAL LOW (ref 3.5–5.0)
Alkaline Phosphatase: 78 U/L (ref 38–126)
Anion gap: 15 (ref 5–15)
BUN: 15 mg/dL (ref 8–23)
CO2: 30 mmol/L (ref 22–32)
Calcium: 7.7 mg/dL — ABNORMAL LOW (ref 8.9–10.3)
Chloride: 97 mmol/L — ABNORMAL LOW (ref 98–111)
Creatinine, Ser: 0.62 mg/dL (ref 0.44–1.00)
GFR, Estimated: >60 mL/min (ref >60)
Glucose, Bld: 107 mg/dL — ABNORMAL HIGH (ref 70–99)
Potassium: <2 mmol/L — CL (ref 3.5–5.1)
Sodium: 142 mmol/L (ref 135–145)
Total Bilirubin: 0.9 mg/dL (ref 0.0–1.2)
Total Protein: 6.5 g/dL (ref 6.5–8.1)

## 2024-03-22 LAB — MAGNESIUM: Magnesium: 1.4 mg/dL — ABNORMAL LOW (ref 1.7–2.4)

## 2024-03-22 LAB — LIPASE, BLOOD: Lipase: 26 U/L (ref 11–51)

## 2024-03-22 MED ORDER — LACTATED RINGERS IV BOLUS
1000.0000 mL | Freq: Once | INTRAVENOUS | Status: AC
  Administered 2024-03-22: 1000 mL via INTRAVENOUS

## 2024-03-22 MED ORDER — ROSUVASTATIN CALCIUM 10 MG PO TABS
10.0000 mg | ORAL_TABLET | Freq: Every day | ORAL | Status: DC
  Administered 2024-03-23 – 2024-03-29 (×7): 10 mg via ORAL
  Filled 2024-03-22 (×7): qty 1

## 2024-03-22 MED ORDER — LOSARTAN POTASSIUM 50 MG PO TABS
100.0000 mg | ORAL_TABLET | Freq: Every day | ORAL | Status: DC
Start: 2024-03-23 — End: 2024-03-23

## 2024-03-22 MED ORDER — ACETAMINOPHEN 325 MG PO TABS: 650.0000 mg | ORAL_TABLET | Freq: Four times a day (QID) | ORAL | Status: DC | PRN

## 2024-03-22 MED ORDER — GABAPENTIN 100 MG PO CAPS
100.0000 mg | ORAL_CAPSULE | Freq: Three times a day (TID) | ORAL | Status: DC
Start: 2024-03-22 — End: 2024-03-24
  Administered 2024-03-22: 100 mg via ORAL
  Filled 2024-03-22 (×4): qty 1

## 2024-03-22 MED ORDER — MAGNESIUM SULFATE 2 GM/50ML IV SOLN
2.0000 g | Freq: Once | INTRAVENOUS | Status: AC
  Administered 2024-03-22: 2 g via INTRAVENOUS
  Filled 2024-03-22: qty 50

## 2024-03-22 MED ORDER — LACTATED RINGERS IV SOLN: INTRAVENOUS | Status: AC

## 2024-03-22 MED ORDER — POTASSIUM CHLORIDE CRYS ER 20 MEQ PO TBCR
40.0000 meq | EXTENDED_RELEASE_TABLET | Freq: Once | ORAL | Status: AC
Start: 2024-03-22 — End: 2024-03-22
  Administered 2024-03-22: 40 meq via ORAL
  Filled 2024-03-22: qty 2

## 2024-03-22 MED ORDER — DAPAGLIFLOZIN PROPANEDIOL 10 MG PO TABS
10.0000 mg | ORAL_TABLET | Freq: Every day | ORAL | Status: DC
  Administered 2024-03-23 – 2024-03-29 (×6): 10 mg via ORAL
  Filled 2024-03-22 (×7): qty 1

## 2024-03-22 MED ORDER — ACETAMINOPHEN 650 MG RE SUPP: 650.0000 mg | Freq: Four times a day (QID) | RECTAL | Status: DC | PRN

## 2024-03-22 MED ORDER — PROCHLORPERAZINE EDISYLATE 10 MG/2ML IJ SOLN: 5.0000 mg | INTRAMUSCULAR | Status: DC | PRN

## 2024-03-22 MED ORDER — POTASSIUM CHLORIDE CRYS ER 20 MEQ PO TBCR: 40.0000 meq | EXTENDED_RELEASE_TABLET | Freq: Every day | ORAL | Status: DC

## 2024-03-22 MED ORDER — POTASSIUM CHLORIDE 10 MEQ/100ML IV SOLN
10.0000 meq | INTRAVENOUS | Status: AC
  Administered 2024-03-22 – 2024-03-23 (×4): 10 meq via INTRAVENOUS
  Filled 2024-03-22 (×4): qty 100

## 2024-03-22 NOTE — Assessment & Plan Note (Signed)
 Sliding scale insulin  coverage

## 2024-03-22 NOTE — Assessment & Plan Note (Signed)
 Heart rate in the mid 50s Will hold metoprolol  for now

## 2024-03-22 NOTE — ED Notes (Signed)
 Pt reports to this RN I think I just peed on myself. This RN and another CHARITY FUNDRAISER at bedside. Pt reports that she has been having consistent diarrhea. Brief noted to be soiled in urine. Purewick placed on pt and new brief applied. Pt given warm blankets and adjusted in bed. Denies further needs at this time.

## 2024-03-22 NOTE — ED Notes (Signed)
 First nurse note-pt brought in via ems from home with abd pain and diarrhea.  Pt taking otc meds without relief.  Pt alert.  Vs wnl per ems.  Fsbs 79 per ems  hx diabetes.

## 2024-03-22 NOTE — Assessment & Plan Note (Addendum)
 Secondary to GI losses  continue IV and oral potassium repletion IV magnesium  IV hydration Continuous cardiac monitoring Pharmacy consult for electrolyte management

## 2024-03-22 NOTE — ED Provider Notes (Signed)
 [facility] Provider Note    Event Date/Time   First MD Initiated Contact with Patient 03/22/24 1937     (approximate)   History   Chief Complaint Abdominal Pain   HPI  Lisa [name] is a 78 y.o. female with past medical history of hypertension, diabetes, DVT, and HFpEF who presents to the ED complaining of diarrhea.  Patient reports that she has been passing watery stool every 30 to 40 minutes for the past 3 days.  She reports pain in her lower abdomen when she goes to have a bowel movement, but has not had any nausea or vomiting.  She has not noticed any blood in her stool and denies any fevers or recent travel outside of the country.  She has not had any courses of antibiotics recently.  She does state that her oral intake has been decreased since the onset of symptoms.  She denies any cough or congestion.     Physical Exam   Triage Vital Signs: ED Triage Vitals  Encounter Vitals Group     BP 03/22/24 1838 (!) 123/99     Girls Systolic BP Percentile --      Girls Diastolic BP Percentile --      Boys Systolic BP Percentile --      Boys Diastolic BP Percentile --      Pulse Rate 03/22/24 1838 (!) 55     Resp 03/22/24 1838 18     Temp 03/22/24 1838 98.9 F (37.2 C)     Temp Source 03/22/24 1838 Oral     SpO2 03/22/24 1838 100 %     Weight 03/22/24 1833 115 lb (52.2 kg)     Height 03/22/24 1833 5' 4 (1.626 m)     Head Circumference --      Peak Flow --      Pain Score 03/22/24 1833 8     Pain Loc --      Pain Education --      Exclude from Growth Chart --     Most recent vital signs: Vitals:   03/22/24 1838  BP: (!) 123/99  Pulse: (!) 55  Resp: 18  Temp: 98.9 F (37.2 C)  SpO2: 100%    Constitutional: Alert and oriented. Eyes: Conjunctivae are normal. Head: Atraumatic. Nose: No congestion/rhinnorhea. Mouth/Throat: Mucous membranes are moist.  Cardiovascular: Normal rate, regular rhythm. Grossly normal heart sounds.  2+  radial pulses bilaterally. Respiratory: Normal respiratory effort.  No retractions. Lungs CTAB. Gastrointestinal: Soft and nontender. No distention. Musculoskeletal: No lower extremity tenderness nor edema.  Neurologic:  Normal speech and language. No gross focal neurologic deficits are appreciated.    ED Results / Procedures / Treatments   Labs (all labs ordered are listed, but only abnormal results are displayed) Labs Reviewed  COMPREHENSIVE METABOLIC PANEL WITH GFR - Abnormal; Notable for the following components:      Result Value   Potassium <2.0 (*)    Chloride 97 (*)    Glucose, Bld 107 (*)    Calcium  7.7 (*)    Albumin 2.9 (*)    All other components within normal limits  CBC - Abnormal; Notable for the following components:   Hemoglobin 11.4 (*)    HCT 35.7 (*)    RDW 17.9 (*)    All other components within normal limits  MAGNESIUM  - Abnormal; Notable for the following components:   Magnesium  1.4 (*)    All other components within normal limits  GASTROINTESTINAL  PANEL BY PCR, STOOL (REPLACES STOOL CULTURE)  C DIFFICILE QUICK SCREEN W PCR REFLEX    LIPASE, BLOOD  URINALYSIS, ROUTINE W REFLEX MICROSCOPIC     EKG  ED ECG REPORT I, Carlin Palin, the attending physician, personally viewed and interpreted this ECG.   Date: 03/22/2024  EKG Time: 20:12  Rate: 57  Rhythm: sinus tachycardia  Axis: LAD  Intervals:none  ST&T Change: None   PROCEDURES:  Critical Care performed: Yes, see critical care procedure note(s)  .Critical Care  Performed by: Palin Carlin, MD Authorized by: Palin Carlin, MD   Critical care provider statement:    Critical care time (minutes):  30   Critical care time was exclusive of:  Separately billable procedures and treating other patients and teaching time   Critical care was necessary to treat or prevent imminent or life-threatening deterioration of the following conditions:  Metabolic crisis   Critical care was time spent  personally by me on the following activities:  Development of treatment plan with patient or surrogate, discussions with consultants, evaluation of patient's response to treatment, examination of patient, ordering and review of laboratory studies, ordering and review of radiographic studies, ordering and performing treatments and interventions, pulse oximetry, re-evaluation of patient's condition and review of old charts   I assumed direction of critical care for this patient from another provider in my specialty: no     Care discussed with: admitting provider      MEDICATIONS ORDERED IN ED: Medications  magnesium  sulfate IVPB 2 g 50 mL (has no administration in time range)  potassium chloride  10 mEq in 100 mL IVPB (10 mEq Intravenous New Bag/Given 03/22/24 2025)  lactated ringers  bolus 1,000 mL (1,000 mLs Intravenous New Bag/Given 03/22/24 2022)  potassium chloride  SA (KLOR-CON  M) CR tablet 40 mEq (40 mEq Oral Given 03/22/24 2012)     IMPRESSION / MDM / ASSESSMENT AND PLAN / ED COURSE  I reviewed the triage vital signs and the nursing notes.                              78 y.o. female with past medical history of hypertension, diabetes, DVT, and HFpEF who presents to the ED complaining of frequent watery diarrhea over the past 3 days.  Patient's presentation is most consistent with acute presentation with potential threat to life or bodily function.  Differential diagnosis includes, but is not limited to, gastroenteritis, dehydration, electrolyte abnormality, AKI, C. difficile.  Patient nontoxic-appearing and in no acute distress, vital signs remarkable for mild bradycardia but otherwise reassuring.  EKG shows sinus bradycardia with no other concerning findings.  She has a benign abdominal exam and symptoms most consistent with a gastroenteritis.  She does have severe hypokalemia with potassium of less than 2, also noted to be hypomagnesemic.  We will repeat the electrolytes, no  significant AKI or LFT abnormality noted.  Patient also without significant anemia or leukocytosis.  Patient unable to provide stool sample thus far, case discussed with hospitalist for admission.      FINAL CLINICAL IMPRESSION(S) / ED DIAGNOSES   Final diagnoses:  Diarrhea, unspecified type  Hypokalemia  Hypomagnesemia     Rx / DC Orders   ED Discharge Orders     None        Note:  This document was prepared using Dragon voice recognition software and may include unintentional dictation errors.   Palin Carlin, MD 03/22/24 2140

## 2024-03-22 NOTE — Assessment & Plan Note (Addendum)
 History of GI consult 06/2022 with concern for microscopic colitis Patient with hourly watery bowel movements x 3 days Patient did not not follow through with colonoscopy nor fecal calprotectin study 06/2022 Follow-up GI panel ordered from the ED Fluid and electrolyte repletion as above  Rectal tube if needed Skin barrier protective precautions GI consulted for consideration of inpatient colonoscopy

## 2024-03-22 NOTE — Consult Note (Signed)
 PHARMACY CONSULT NOTE - FOLLOW UP  Pharmacy Consult for Electrolyte Monitoring and Replacement   Recent Labs: Potassium (mmol/L)  Date Value  03/22/2024 <2.0 (LL)   Magnesium  (mg/dL)  Date Value  89/71/7974 1.4 (L)   Calcium  (mg/dL)  Date Value  89/71/7974 7.7 (L)   Albumin (g/dL)  Date Value  89/71/7974 2.9 (L)  09/25/2015 4.4   Phosphorus (mg/dL)  Date Value  88/80/7977 4.1   Sodium (mmol/L)  Date Value  03/22/2024 142  09/25/2015 142     Assessment: Patient is a 78 y.o female with a PMH of HTN, DM, DVT and HFpEF with a chief complaint of diarrhea for the last 3 days with lower abdominal pain. No blood noted in stool. Potassium on intake <2.0, corrected ca+ =8.58 mg/dl.  Goal of Therapy:  Electrolytes WNL  Plan:  Patient given K+ 40meq Po x1 and 10 meq IVPB x4 doses  Magnesium  2G x1 Will order BMP with AM labs. Pharmacy will continue to monitor.  Annabella LOISE Banks ,PharmD Clinical Pharmacist 03/22/2024 10:04 PM

## 2024-03-22 NOTE — Assessment & Plan Note (Addendum)
 Clinically compensated Continue home losartan , Farxiga --holding metoprolol  due to sinus bradycardia Monitor for decompensation and failure of IV fluid hydration Daily weights

## 2024-03-22 NOTE — Assessment & Plan Note (Signed)
 Hemoglobin 11.4, down from 13.1 a month ago Low suspicion for GI bleed at this time Continue to monitor

## 2024-03-22 NOTE — ED Triage Notes (Addendum)
 Pt presents to the ED via [facility] from home with diarrhea. Pt reports continuously pooping for several days. Pt reports nausea and very mild generalized abdominal pain.

## 2024-03-22 NOTE — H&P (Signed)
 History and Physical    Patient: Lisa [name] FMW:[id number] DOB: [birth year] DOA: 03/22/2024 DOS: the patient was seen and examined on 03/22/2024 PCP: Glenard Mire, MD  Patient coming from: Home  Chief Complaint:  Chief Complaint  Patient presents with   Abdominal Pain    HPI: Lisa [name] is a 78 y.o. female with medical history significant for Prior DVT, not on AC, DM, HTN, HFpEF, chronic functional diarrhea  (hospitalized 03/2023 for diarrhea with electrolyte abnormalities), being hospitalized with a 3-day history of watery diarrhea associated with electrolyte disturbance including severe hypokalemia less than 2. Diarrhea has been watery, yellowish in color nonbloody and less frequent as every 40 minutes.  She has no nausea or vomiting.  Does have occasional crampy abdominal pain.  Denies fever or chills.  Denies affected contacts. At baseline, patient has 2 loose bowel movements daily, brown in color. In the ED, afebrile, heart rate 55 and BP 123/99 Labs significant for potassium<2, magnesium  1.4Spoke with SNP 500 index I think is something that is a a lot more extension between the problem affecting. Lipase and LFTs WNL WBC normal at 8.4 Hemoglobin 11.4, (13.1 in September 2025) Urinalysis pending EKG showed sinus bradycardia with T wave flattening No imaging done Patient treated with an LR bolus and started on IV and oral potassium repletion Admission requested     Review of Systems: As mentioned in the history of present illness. All other systems reviewed and are negative.  Past Medical History:  Diagnosis Date   Blindness of right eye    CHF (congestive heart failure) (HCC)    Depression    Diabetes mellitus without complication (HCC)    DVT of leg (deep venous thrombosis) (HCC) 2011   Glaucoma    Grade II diastolic dysfunction    Hyperlipidemia    Hypertension    Vascular disease    Past Surgical History:  Procedure Laterality Date    ABDOMINAL HYSTERECTOMY  1992   Total   CATARACT EXTRACTION W/PHACO Left 02/25/2023   Procedure: CATARACT EXTRACTION PHACO AND INTRAOCULAR LENS PLACEMENT (IOC) LEFT DIABETIC  malyugin;  Surgeon: Mittie Gaskin, MD;  Location: [facility];  Service: Ophthalmology;  Laterality: Left;  10.78 0:49.1   EYE SURGERY     LOWER EXTREMITY ANGIOGRAPHY Left 09/03/2023   Procedure: Lower Extremity Angiography;  Surgeon: Marea Selinda RAMAN, MD;  Location: [facility];  Service: Cardiovascular;  Laterality: Left;   LOWER EXTREMITY ANGIOGRAPHY Left 11/05/2023   Procedure: Lower Extremity Angiography;  Surgeon: Marea Selinda RAMAN, MD;  Location: [facility];  Service: Cardiovascular;  Laterality: Left;   LOWER EXTREMITY INTERVENTION Left 09/03/2023   Procedure: LOWER EXTREMITY INTERVENTION;  Surgeon: Marea Selinda RAMAN, MD;  Location: [facility];  Service: Cardiovascular;  Laterality: Left;   LOWER EXTREMITY INTERVENTION Left 11/05/2023   Procedure: LOWER EXTREMITY INTERVENTION;  Surgeon: Marea Selinda RAMAN, MD;  Location: [facility];  Service: Cardiovascular;  Laterality: Left;   TOE AMPUTATION Left 2011   All of her toes on the left foot   VASCULAR SURGERY     Social History:  reports that she has been smoking cigarettes. She started smoking about 24 years ago. She has a 10.3 pack-year smoking history. She has never used smokeless tobacco. She reports current alcohol  use. She reports that she does not use drugs.  Allergies  Allergen Reactions   Other Itching    Nucinta - Hallucinations   Tapentadol Hcl Itching  Hallucinations   Actos  [Pioglitazone ]     History of CHF   Penicillins Rash    Family History  Problem Relation Age of Onset   Heart disease Father    Diabetes Mother    Diabetes Brother    Kidney disease Brother        Transplant   Heart disease Brother    Diabetes Sister    Diabetes Sister     Prior to Admission medications   Medication Sig Start Date End  Date Taking? Authorizing Provider  acetaminophen  (TYLENOL ) 500 MG tablet Take 2 tablets by mouth every 6 (six) hours as needed.    [provider]  amLODipine  (NORVASC ) 10 MG tablet Take 1 tablet (10 mg total) by mouth daily. 02/02/24   Sowles, Krichna, MD  aspirin  81 MG tablet Take 81 mg by mouth daily.    [provider]  atropine  1 % ophthalmic solution Place 1 drop into the left eye 3 (three) times daily.    [provider]  brimonidine  (ALPHAGAN ) 0.2 % ophthalmic solution Place 1 drop into both eyes 2 (two) times daily.    [provider]  citalopram  (CELEXA ) 10 MG tablet Take 1 tablet (10 mg total) by mouth daily. 02/02/24   Sowles, Krichna, MD  clopidogrel  (PLAVIX ) 75 MG tablet Take 1 tablet (75 mg total) by mouth daily. 11/05/23   Marea Selinda RAMAN, MD  Cyanocobalamin  (B-12) 1000 MCG SUBL Place 1 tablet under the tongue daily. 02/02/24   Sowles, Krichna, MD  dapagliflozin  propanediol (FARXIGA ) 10 MG TABS tablet Take 1 tablet (10 mg total) by mouth daily. TAKE 1 TABLET BY MOUTH EVERY DAY BEFORE BREAKFAST 02/02/24   Sowles, Krichna, MD  dorzolamide -timolol  (COSOPT ) 22.3-6.8 MG/ML ophthalmic solution Place 1 drop into both eyes 2 (two) times daily. 03/02/21   [provider]  feeding supplement (ENSURE ENLIVE / ENSURE PLUS) LIQD Take 237 mLs by mouth 2 (two) times daily between meals. 04/14/23   Jhonny Calvin NOVAK, MD  gabapentin  (NEURONTIN ) 100 MG capsule Take 1 capsule (100 mg total) by mouth 3 (three) times daily. 11/05/23 11/04/24  Marea Selinda RAMAN, MD  losartan  (COZAAR ) 100 MG tablet Take 1 tablet (100 mg total) by mouth daily. 02/02/24   Sowles, Krichna, MD  metoprolol  succinate (TOPROL  XL) 25 MG 24 hr tablet Take 1 tablet (25 mg total) by mouth daily. Take with or immediately following a meal. 02/02/24 02/01/25  Sowles, Krichna, MD  Multiple Vitamin (MULTIVITAMIN) tablet Take 1 tablet by mouth daily.    [provider]  nitroGLYCERIN  (NITROSTAT ) 0.4 MG SL  tablet Place 1 tablet (0.4 mg total) under the tongue every 5 (five) minutes as needed for chest pain. 04/13/21   Patel, Sona, MD  polyvinyl alcohol  (LIQUIFILM TEARS) 1.4 % ophthalmic solution Place 2 drops into the right eye as needed for dry eyes. 04/14/23   Jhonny Calvin NOVAK, MD  potassium chloride  SA (KLOR-CON  M) 20 MEQ tablet Take 1 tablet (20 mEq total) by mouth daily. 02/03/24   Sowles, Krichna, MD  prednisoLONE  acetate (PRED FORTE ) 1 % ophthalmic suspension Place 1 drop into the left eye 4 (four) times daily. 03/07/21   [provider]  rosuvastatin  (CRESTOR ) 10 MG tablet Take 1 tablet (10 mg total) by mouth daily. 02/02/24   Sowles, Krichna, MD  traMADol  (ULTRAM ) 50 MG tablet Take 1 tablet (50 mg total) by mouth every 6 (six) hours as needed. 08/25/23   Marea Selinda RAMAN, MD  Vibegron  (GEMTESA ) 75 MG  TABS Take 75 mg by mouth daily. 04/08/22   Penne Knee, MD  Vitamin D , Ergocalciferol , (DRISDOL ) 1.25 MG (50000 UNIT) CAPS capsule Take 1 capsule (50,000 Units total) by mouth every 7 (seven) days. 02/02/24   Sowles, Krichna, MD    Physical Exam: Vitals:   03/22/24 1833 03/22/24 1838  BP:  (!) 123/99  Pulse:  (!) 55  Resp:  18  Temp:  98.9 F (37.2 C)  TempSrc:  Oral  SpO2:  100%  Weight: 52.2 kg   Height: 5' 4 (1.626 m)    Physical Exam Vitals and nursing note reviewed.  Constitutional:      General: She is not in acute distress. HENT:     Head: Normocephalic and atraumatic.  Cardiovascular:     Rate and Rhythm: Regular rhythm. Bradycardia present.     Heart sounds: Normal heart sounds.  Pulmonary:     Effort: Pulmonary effort is normal.     Breath sounds: Normal breath sounds.  Abdominal:     Palpations: Abdomen is soft.     Tenderness: There is no abdominal tenderness.  Neurological:     Mental Status: Mental status is at baseline.     Labs on Admission: I have personally reviewed following labs and imaging studies  CBC: Recent Labs  Lab 03/22/24 1838  WBC  8.4  HGB 11.4*  HCT 35.7*  MCV 84.2  PLT 297   Basic Metabolic Panel: Recent Labs  Lab 03/22/24 1838  NA 142  K <2.0*  CL 97*  CO2 30  GLUCOSE 107*  BUN 15  CREATININE 0.62  CALCIUM  7.7*  MG 1.4*   GFR: Estimated Creatinine Clearance: 47.8 mL/min (by C-G formula based on SCr of 0.62 mg/dL). Liver Function Tests: Recent Labs  Lab 03/22/24 1838  AST 23  ALT 19  ALKPHOS 78  BILITOT 0.9  PROT 6.5  ALBUMIN 2.9*   Recent Labs  Lab 03/22/24 1838  LIPASE 26   No results for input(s): AMMONIA in the last 168 hours. Coagulation Profile: No results for input(s): INR, PROTIME in the last 168 hours. Cardiac Enzymes: No results for input(s): CKTOTAL, CKMB, CKMBINDEX, TROPONINI in the last 168 hours. BNP (last 3 results) No results for input(s): PROBNP in the last 8760 hours. HbA1C: No results for input(s): HGBA1C in the last 72 hours. CBG: No results for input(s): GLUCAP in the last 168 hours. Lipid Profile: No results for input(s): CHOL, HDL, LDLCALC, TRIG, CHOLHDL, LDLDIRECT in the last 72 hours. Thyroid  Function Tests: No results for input(s): TSH, T4TOTAL, FREET4, T3FREE, THYROIDAB in the last 72 hours. Anemia Panel: No results for input(s): VITAMINB12, FOLATE, FERRITIN, TIBC, IRON, RETICCTPCT in the last 72 hours. Urine analysis:    Component Value Date/Time   COLORURINE AMBER (A) 02/25/2021 2119   APPEARANCEUR Clear 04/08/2022 1525   LABSPEC 1.017 02/25/2021 2119   PHURINE 5.0 02/25/2021 2119   GLUCOSEU Negative 04/08/2022 1525   HGBUR NEGATIVE 02/25/2021 2119   BILIRUBINUR Negative 04/08/2022 1525   KETONESUR 5 (A) 02/25/2021 2119   PROTEINUR Negative 04/08/2022 1525   PROTEINUR 30 (A) 02/25/2021 2119   UROBILINOGEN 0.2 09/24/2020 1615   NITRITE Negative 04/08/2022 1525   NITRITE NEGATIVE 02/25/2021 2119   LEUKOCYTESUR Negative 04/08/2022 1525   LEUKOCYTESUR TRACE (A) 02/25/2021 2119     Radiological Exams on Admission: No results found. Data Reviewed for HPI: Relevant notes from primary care and specialist visits, past discharge summaries as available in EHR, including Care Everywhere. Prior diagnostic testing  as pertinent to current admission diagnoses Updated medications and problem lists for reconciliation ED course, including vitals, labs, imaging, treatment and response to treatment Triage notes, nursing and pharmacy notes and ED provider's notes Notable results as noted above in HPI      Assessment and Plan: * Acute hypokalemia and hypomagnesemia secondary to diarrhea Secondary to GI losses  continue IV and oral potassium repletion IV magnesium  IV hydration Continuous cardiac monitoring Pharmacy consult for electrolyte management  Chronic diarrhea, with acute worsening History of GI consult 06/2022 with concern for microscopic colitis Patient with hourly watery bowel movements x 3 days Patient did not not follow through with colonoscopy nor fecal calprotectin study 06/2022 Follow-up GI panel ordered from the ED Fluid and electrolyte repletion as above  Rectal tube if needed Skin barrier protective precautions GI consulted for consideration of inpatient colonoscopy   Sinus bradycardia Heart rate in the mid 50s Will hold metoprolol  for now  Anemia Hemoglobin 11.4, down from 13.1 a month ago Low suspicion for GI bleed at this time Continue to monitor  Chronic heart failure with preserved ejection fraction (HFpEF) (HCC) Clinically compensated Continue home losartan , Farxiga --holding metoprolol  due to sinus bradycardia Monitor for decompensation and failure of IV fluid hydration Daily weights  Urinary incontinence Awaiting med rec  Hypertension Continue home meds of losartan  and metoprolol   Type 2 diabetes mellitus with peripheral neuropathy (HCC) Sliding scale insulin  coverage     DVT prophylaxis: SCD, until hemoglobin  stable  Consults: Gi Dr Maryruth  Advance Care Planning:   Code Status: Prior   Family Communication: none  Disposition Plan: Back to previous home environment  Severity of Illness: The appropriate patient status for this patient is OBSERVATION. Observation status is judged to be reasonable and necessary in order to provide the required intensity of service to ensure the patient's safety. The patient's presenting symptoms, physical exam findings, and initial radiographic and laboratory data in the context of their medical condition is felt to place them at decreased risk for further clinical deterioration. Furthermore, it is anticipated that the patient will be medically stable for discharge from the hospital within 2 midnights of admission.   Author: Delayne LULLA Solian, MD 03/22/2024 9:49 PM  For on call review [email or web address].

## 2024-03-22 NOTE — Assessment & Plan Note (Signed)
 Continue home meds of losartan  and metoprolol 

## 2024-03-23 ENCOUNTER — Other Ambulatory Visit: Payer: Self-pay

## 2024-03-23 ENCOUNTER — Observation Stay

## 2024-03-23 DIAGNOSIS — I160 Hypertensive urgency: Secondary | ICD-10-CM | POA: Diagnosis present

## 2024-03-23 DIAGNOSIS — R197 Diarrhea, unspecified: Secondary | ICD-10-CM | POA: Diagnosis not present

## 2024-03-23 DIAGNOSIS — I517 Cardiomegaly: Secondary | ICD-10-CM | POA: Diagnosis not present

## 2024-03-23 DIAGNOSIS — R0600 Dyspnea, unspecified: Secondary | ICD-10-CM | POA: Diagnosis not present

## 2024-03-23 DIAGNOSIS — I110 Hypertensive heart disease with heart failure: Secondary | ICD-10-CM | POA: Diagnosis not present

## 2024-03-23 DIAGNOSIS — E1151 Type 2 diabetes mellitus with diabetic peripheral angiopathy without gangrene: Secondary | ICD-10-CM | POA: Diagnosis present

## 2024-03-23 DIAGNOSIS — F17210 Nicotine dependence, cigarettes, uncomplicated: Secondary | ICD-10-CM | POA: Diagnosis not present

## 2024-03-23 DIAGNOSIS — E1139 Type 2 diabetes mellitus with other diabetic ophthalmic complication: Secondary | ICD-10-CM | POA: Diagnosis present

## 2024-03-23 DIAGNOSIS — Z8249 Family history of ischemic heart disease and other diseases of the circulatory system: Secondary | ICD-10-CM | POA: Diagnosis not present

## 2024-03-23 DIAGNOSIS — E1142 Type 2 diabetes mellitus with diabetic polyneuropathy: Secondary | ICD-10-CM | POA: Diagnosis present

## 2024-03-23 DIAGNOSIS — Z7902 Long term (current) use of antithrombotics/antiplatelets: Secondary | ICD-10-CM | POA: Diagnosis not present

## 2024-03-23 DIAGNOSIS — E1140 Type 2 diabetes mellitus with diabetic neuropathy, unspecified: Secondary | ICD-10-CM | POA: Diagnosis not present

## 2024-03-23 DIAGNOSIS — Z833 Family history of diabetes mellitus: Secondary | ICD-10-CM | POA: Diagnosis not present

## 2024-03-23 DIAGNOSIS — I5032 Chronic diastolic (congestive) heart failure: Secondary | ICD-10-CM | POA: Diagnosis not present

## 2024-03-23 DIAGNOSIS — F419 Anxiety disorder, unspecified: Secondary | ICD-10-CM | POA: Diagnosis present

## 2024-03-23 DIAGNOSIS — Z7982 Long term (current) use of aspirin: Secondary | ICD-10-CM | POA: Diagnosis not present

## 2024-03-23 DIAGNOSIS — E785 Hyperlipidemia, unspecified: Secondary | ICD-10-CM | POA: Diagnosis present

## 2024-03-23 DIAGNOSIS — K449 Diaphragmatic hernia without obstruction or gangrene: Secondary | ICD-10-CM | POA: Diagnosis present

## 2024-03-23 DIAGNOSIS — R001 Bradycardia, unspecified: Secondary | ICD-10-CM | POA: Diagnosis not present

## 2024-03-23 DIAGNOSIS — R32 Unspecified urinary incontinence: Secondary | ICD-10-CM | POA: Insufficient documentation

## 2024-03-23 DIAGNOSIS — F32A Depression, unspecified: Secondary | ICD-10-CM | POA: Diagnosis not present

## 2024-03-23 DIAGNOSIS — J449 Chronic obstructive pulmonary disease, unspecified: Secondary | ICD-10-CM | POA: Diagnosis present

## 2024-03-23 DIAGNOSIS — R0989 Other specified symptoms and signs involving the circulatory and respiratory systems: Secondary | ICD-10-CM | POA: Diagnosis not present

## 2024-03-23 DIAGNOSIS — K529 Noninfective gastroenteritis and colitis, unspecified: Secondary | ICD-10-CM | POA: Diagnosis not present

## 2024-03-23 DIAGNOSIS — E876 Hypokalemia: Secondary | ICD-10-CM | POA: Diagnosis not present

## 2024-03-23 DIAGNOSIS — Z66 Do not resuscitate: Secondary | ICD-10-CM | POA: Diagnosis present

## 2024-03-23 DIAGNOSIS — D649 Anemia, unspecified: Secondary | ICD-10-CM | POA: Diagnosis not present

## 2024-03-23 DIAGNOSIS — Z880 Allergy status to penicillin: Secondary | ICD-10-CM | POA: Diagnosis not present

## 2024-03-23 DIAGNOSIS — Z888 Allergy status to other drugs, medicaments and biological substances status: Secondary | ICD-10-CM | POA: Diagnosis not present

## 2024-03-23 DIAGNOSIS — I77819 Aortic ectasia, unspecified site: Secondary | ICD-10-CM | POA: Diagnosis not present

## 2024-03-23 DIAGNOSIS — Z86718 Personal history of other venous thrombosis and embolism: Secondary | ICD-10-CM | POA: Diagnosis not present

## 2024-03-23 DIAGNOSIS — D509 Iron deficiency anemia, unspecified: Secondary | ICD-10-CM | POA: Diagnosis present

## 2024-03-23 DIAGNOSIS — N39498 Other specified urinary incontinence: Secondary | ICD-10-CM | POA: Diagnosis not present

## 2024-03-23 LAB — BASIC METABOLIC PANEL WITH GFR
Anion gap: 13 (ref 5–15)
Anion gap: 16 — ABNORMAL HIGH (ref 5–15)
Anion gap: 11 (ref 5–15)
Anion gap: 12 (ref 5–15)
BUN: 14 mg/dL (ref 8–23)
BUN: 11 mg/dL (ref 8–23)
BUN: 10 mg/dL (ref 8–23)
BUN: 9 mg/dL (ref 8–23)
CO2: 29 mmol/L (ref 22–32)
CO2: 27 mmol/L (ref 22–32)
CO2: 28 mmol/L (ref 22–32)
CO2: 26 mmol/L (ref 22–32)
Calcium: 7.1 mg/dL — ABNORMAL LOW (ref 8.9–10.3)
Calcium: 7.4 mg/dL — ABNORMAL LOW (ref 8.9–10.3)
Calcium: 7.4 mg/dL — ABNORMAL LOW (ref 8.9–10.3)
Calcium: 7.3 mg/dL — ABNORMAL LOW (ref 8.9–10.3)
Chloride: 100 mmol/L (ref 98–111)
Chloride: 102 mmol/L (ref 98–111)
Chloride: 104 mmol/L (ref 98–111)
Chloride: 103 mmol/L (ref 98–111)
Creatinine, Ser: 0.56 mg/dL (ref 0.44–1.00)
Creatinine, Ser: 0.52 mg/dL (ref 0.44–1.00)
Creatinine, Ser: 0.67 mg/dL (ref 0.44–1.00)
Creatinine, Ser: 0.72 mg/dL (ref 0.44–1.00)
GFR, Estimated: >60 mL/min (ref >60)
GFR, Estimated: >60 mL/min (ref >60)
GFR, Estimated: >60 mL/min (ref >60)
GFR, Estimated: >60 mL/min (ref >60)
Glucose, Bld: 153 mg/dL — ABNORMAL HIGH (ref 70–99)
Glucose, Bld: 106 mg/dL — ABNORMAL HIGH (ref 70–99)
Glucose, Bld: 152 mg/dL — ABNORMAL HIGH (ref 70–99)
Glucose, Bld: 164 mg/dL — ABNORMAL HIGH (ref 70–99)
Potassium: 2 mmol/L — CL (ref 3.5–5.1)
Potassium: 2.5 mmol/L — CL (ref 3.5–5.1)
Potassium: 2.4 mmol/L — CL (ref 3.5–5.1)
Potassium: 2.2 mmol/L — CL (ref 3.5–5.1)
Sodium: 142 mmol/L (ref 135–145)
Sodium: 145 mmol/L (ref 135–145)
Sodium: 143 mmol/L (ref 135–145)
Sodium: 141 mmol/L (ref 135–145)

## 2024-03-23 LAB — GASTROINTESTINAL PANEL BY PCR, STOOL (REPLACES STOOL CULTURE)

## 2024-03-23 LAB — CBC
HCT: 31 % — ABNORMAL LOW (ref 36.0–46.0)
HCT: 32.3 % — ABNORMAL LOW (ref 36.0–46.0)
Hemoglobin: 10.1 g/dL — ABNORMAL LOW (ref 12.0–15.0)
Hemoglobin: 10.7 g/dL — ABNORMAL LOW (ref 12.0–15.0)
MCH: 26.9 pg (ref 26.0–34.0)
MCH: 27.2 pg (ref 26.0–34.0)
MCHC: 32.6 g/dL (ref 30.0–36.0)
MCHC: 33.1 g/dL (ref 30.0–36.0)
MCV: 82.4 fL (ref 80.0–100.0)
MCV: 82 fL (ref 80.0–100.0)
Platelets: 272 K/uL (ref 150–400)
Platelets: 310 K/uL (ref 150–400)
RBC: 3.76 MIL/uL — ABNORMAL LOW (ref 3.87–5.11)
RBC: 3.94 MIL/uL (ref 3.87–5.11)
RDW: 17.9 % — ABNORMAL HIGH (ref 11.5–15.5)
RDW: 17.7 % — ABNORMAL HIGH (ref 11.5–15.5)
WBC: 6.9 K/uL (ref 4.0–10.5)
WBC: 6.8 K/uL (ref 4.0–10.5)
nRBC: 0 % (ref 0.0–0.2)
nRBC: 0 % (ref 0.0–0.2)

## 2024-03-23 LAB — PHOSPHORUS: Phosphorus: 2.7 mg/dL (ref 2.5–4.6)

## 2024-03-23 LAB — MAGNESIUM
Magnesium: 1.6 mg/dL — ABNORMAL LOW (ref 1.7–2.4)
Magnesium: 1.9 mg/dL (ref 1.7–2.4)

## 2024-03-23 LAB — C DIFFICILE QUICK SCREEN W PCR REFLEX
C Diff antigen: NEGATIVE
C Diff interpretation: NOT DETECTED
C Diff toxin: NEGATIVE

## 2024-03-23 LAB — POTASSIUM: Potassium: 2.4 mmol/L — CL (ref 3.5–5.1)

## 2024-03-23 LAB — PREALBUMIN: Prealbumin: 5 mg/dL — ABNORMAL LOW (ref 18–38)

## 2024-03-23 MED ORDER — POLYVINYL ALCOHOL 1.4 % OP SOLN: 2.0000 [drp] | OPHTHALMIC | Status: DC | PRN

## 2024-03-23 MED ORDER — POTASSIUM CHLORIDE 10 MEQ/100ML IV SOLN
10.0000 meq | INTRAVENOUS | Status: AC
  Administered 2024-03-23 (×4): 10 meq via INTRAVENOUS
  Filled 2024-03-23 (×8): qty 100

## 2024-03-23 MED ORDER — POTASSIUM CHLORIDE CRYS ER 20 MEQ PO TBCR
40.0000 meq | EXTENDED_RELEASE_TABLET | Freq: Every day | ORAL | Status: DC
  Administered 2024-03-23: 40 meq via ORAL
  Filled 2024-03-23: qty 2

## 2024-03-23 MED ORDER — BRIMONIDINE TARTRATE 0.2 % OP SOLN
1.0000 [drp] | Freq: Two times a day (BID) | OPHTHALMIC | Status: DC
  Administered 2024-03-24 – 2024-03-29 (×11): 1 [drp] via OPHTHALMIC
  Filled 2024-03-23 (×2): qty 5

## 2024-03-23 MED ORDER — POTASSIUM CHLORIDE 10 MEQ/100ML IV SOLN
10.0000 meq | INTRAVENOUS | Status: AC
  Administered 2024-03-23 – 2024-03-24 (×6): 10 meq via INTRAVENOUS
  Filled 2024-03-23 (×6): qty 100

## 2024-03-23 MED ORDER — LOSARTAN POTASSIUM 50 MG PO TABS
100.0000 mg | ORAL_TABLET | Freq: Every day | ORAL | Status: DC
  Administered 2024-03-23 – 2024-03-29 (×7): 100 mg via ORAL
  Filled 2024-03-23 (×8): qty 2

## 2024-03-23 MED ORDER — POTASSIUM CHLORIDE CRYS ER 20 MEQ PO TBCR
60.0000 meq | EXTENDED_RELEASE_TABLET | Freq: Two times a day (BID) | ORAL | Status: DC
Start: 2024-03-23 — End: 2024-03-24
  Administered 2024-03-23: 60 meq via ORAL
  Filled 2024-03-23: qty 3

## 2024-03-23 MED ORDER — HYDRALAZINE HCL 20 MG/ML IJ SOLN
5.0000 mg | INTRAMUSCULAR | Status: DC | PRN
  Administered 2024-03-23 – 2024-03-28 (×6): 5 mg via INTRAVENOUS
  Filled 2024-03-23 (×7): qty 1

## 2024-03-23 MED ORDER — MAGNESIUM SULFATE 2 GM/50ML IV SOLN
2.0000 g | Freq: Once | INTRAVENOUS | Status: AC
  Administered 2024-03-23: 2 g via INTRAVENOUS
  Filled 2024-03-23: qty 50

## 2024-03-23 MED ORDER — PREDNISOLONE ACETATE 1 % OP SUSP
1.0000 [drp] | Freq: Four times a day (QID) | OPHTHALMIC | Status: DC
Start: 2024-03-23 — End: 2024-03-29
  Administered 2024-03-23 – 2024-03-29 (×18): 1 [drp] via OPHTHALMIC
  Filled 2024-03-23: qty 1

## 2024-03-23 MED ORDER — DORZOLAMIDE HCL-TIMOLOL MAL 2-0.5 % OP SOLN
1.0000 [drp] | Freq: Two times a day (BID) | OPHTHALMIC | Status: DC
  Administered 2024-03-23 – 2024-03-29 (×13): 1 [drp] via OPHTHALMIC
  Filled 2024-03-23: qty 10

## 2024-03-23 MED ORDER — ATROPINE SULFATE 1 % OP SOLN
1.0000 [drp] | Freq: Three times a day (TID) | OPHTHALMIC | Status: DC
  Administered 2024-03-23 – 2024-03-29 (×17): 1 [drp] via OPHTHALMIC
  Filled 2024-03-23: qty 2

## 2024-03-23 MED ORDER — POLYETHYLENE GLYCOL 3350 17 GM/SCOOP PO POWD
238.0000 g | Freq: Once | ORAL | Status: AC
  Administered 2024-03-23: 238 g via ORAL
  Filled 2024-03-23: qty 238

## 2024-03-23 NOTE — Plan of Care (Signed)
  Problem: Clinical Measurements: Goal: Ability to maintain clinical measurements within normal limits will improve Outcome: Progressing   Problem: Pain Managment: Goal: General experience of comfort will improve and/or be controlled Outcome: Progressing   Problem: Safety: Goal: Ability to remain free from injury will improve Outcome: Progressing

## 2024-03-23 NOTE — Progress Notes (Signed)
 CRITICAL VALUE STICKER  CRITICAL VALUE: Potassium 2.0  RECEIVER (on-site recipient of call): Marolyn Lanie PEAK  DATE & TIME NOTIFIED: 1029/2025 02:22  MESSENGER (representative from lab): l MD NOTIFIED: Dr. Delayne Solian MD  TIME OF NOTIFICATION: 02:26  RESPONSE:  Potassium chloride  10 meq x 4

## 2024-03-23 NOTE — Consult Note (Signed)
 PHARMACY CONSULT NOTE - FOLLOW UP  Pharmacy Consult for Electrolyte Monitoring and Replacement   Recent Labs: Potassium (mmol/L)  Date Value  03/23/2024 2.0 (LL)   Magnesium  (mg/dL)  Date Value  89/70/7974 1.6 (L)   Calcium  (mg/dL)  Date Value  89/70/7974 7.1 (L)   Albumin (g/dL)  Date Value  89/71/7974 2.9 (L)  09/25/2015 4.4   Phosphorus (mg/dL)  Date Value  89/70/7974 2.7   Sodium (mmol/L)  Date Value  03/23/2024 142  09/25/2015 142    Assessment: Patient is a 77 y.o female with a PMH of HTN, DM, DVT and HFpEF with a chief complaint of diarrhea for the last 3 days with lower abdominal pain. No blood noted in stool.  Pharmacy consulted to manage electrolytes whiel inpatient.   Goal of Therapy:  Electrolytes WNL  Plan:  Kcl 10meq IV x 4 ordered again at 0345 (last dose in progress). Also active order for Kcl po 40meq po daily. Will continue orders as written and follow up with BMP due ~1400 today. Mg = up from 1.4 to 1.6 after 2gm IV given 10/28 @ 2000. Will order MgSulfate 4gm IV x 1 this morning. BMP q 6hr x 5 occurrences ordered by provider Pharmacy will continue to monitor and replace as needed.  Derold Dorsch Rodriguez-Guzman PharmD, BCPS 03/23/2024 7:25 AM

## 2024-03-23 NOTE — Assessment & Plan Note (Signed)
-   Awaiting med rec

## 2024-03-23 NOTE — Consult Note (Signed)
 Lisa JONELLE Brooklyn, MD [address]  [place], [place] [address]  Main: [phone] Fax:  [phone] Pager: [phone]   Consultation  Referring Provider:     No ref. provider found Primary Care Physician:  Sowles, Krichna, MD Primary Gastroenterologist:  Dr. Therisa         Reason for Consultation:     Chronic diarrhea  Date of Admission:  03/22/2024 Date of Consultation:  03/23/2024         HPI:   Lisa [name] is a 78 y.o. female history of diabetes, DVT, CHF, hypertension, hyperlipidemia with history of chronic diarrhea resulting in electrolyte abnormalities, admission in 03/2023, presented with worsening of diarrhea for 3 days resulting in severe hypokalemia requiring admission.  She reports having several nonbloody watery bowel movements during the day and night associated with fecal incontinence for last 3 days with occasional abdominal cramps.  Hemodynamically stable in the ER, serum potassium was <2, low magnesium  no leukocytosis, mild anemia.  She is admitted due to severe hypokalemia and further workup of diarrhea.  Patient was evaluated by Dr. Therisa in 2024 and colonoscopy was recommended at that time.  However, she did not undergo any endoluminal evaluation.  Her serum potassium this morning was 2.5.  Has mild normocytic anemia, has mild iron deficiency.  Stool studies negative for infection including C. difficile History of severe atherosclerotic vascular disease of lower extremities, on Plavix  She has history of heavy tobacco use, quit smoking 5 years ago She also lost weight which has plateaued recently.  She denies any rectal bleeding She does report feeling short of breath today.  She denies feeling short of breath at home. She also reports that she ran out of Plavix  for about a week  NSAIDs: None  Antiplts/Anticoagulants/Anti thrombotics: Plavix   GI Procedures: None  Past Medical History:  Diagnosis Date   Blindness of right eye    CHF (congestive  heart failure) (HCC)    Depression    Diabetes mellitus without complication (HCC)    DVT of leg (deep venous thrombosis) (HCC) 2011   Glaucoma    Grade II diastolic dysfunction    Hyperlipidemia    Hypertension    Vascular disease     Past Surgical History:  Procedure Laterality Date   ABDOMINAL HYSTERECTOMY  1992   Total   CATARACT EXTRACTION W/PHACO Left 02/25/2023   Procedure: CATARACT EXTRACTION PHACO AND INTRAOCULAR LENS PLACEMENT (IOC) LEFT DIABETIC  malyugin;  Surgeon: Mittie Gaskin, MD;  Location: [facility];  Service: Ophthalmology;  Laterality: Left;  10.78 0:49.1   EYE SURGERY     LOWER EXTREMITY ANGIOGRAPHY Left 09/03/2023   Procedure: Lower Extremity Angiography;  Surgeon: Marea Selinda RAMAN, MD;  Location: [facility];  Service: Cardiovascular;  Laterality: Left;   LOWER EXTREMITY ANGIOGRAPHY Left 11/05/2023   Procedure: Lower Extremity Angiography;  Surgeon: Marea Selinda RAMAN, MD;  Location: [facility];  Service: Cardiovascular;  Laterality: Left;   LOWER EXTREMITY INTERVENTION Left 09/03/2023   Procedure: LOWER EXTREMITY INTERVENTION;  Surgeon: Marea Selinda RAMAN, MD;  Location: [facility];  Service: Cardiovascular;  Laterality: Left;   LOWER EXTREMITY INTERVENTION Left 11/05/2023   Procedure: LOWER EXTREMITY INTERVENTION;  Surgeon: Marea Selinda RAMAN, MD;  Location: [facility];  Service: Cardiovascular;  Laterality: Left;   TOE AMPUTATION Left 2011   All of her toes on the left foot   VASCULAR SURGERY       Current Facility-Administered Medications:  acetaminophen  (TYLENOL ) tablet 650 mg, 650 mg, Oral, Q6H PRN **OR** acetaminophen  (TYLENOL ) suppository 650 mg, 650 mg, Rectal, Q6H PRN, Cleatus Delayne GAILS, MD   artificial tears ophthalmic solution 2 drop, 2 drop, Right Eye, PRN, Trudy Anthony HERO, MD   atropine  1 % ophthalmic solution 1 drop, 1 drop, Left Eye, TID, Trudy Anthony HERO, MD   brimonidine  (ALPHAGAN ) 0.2 % ophthalmic  solution 1 drop, 1 drop, Both Eyes, BID, Trudy Anthony HERO, MD   dapagliflozin  propanediol (FARXIGA ) tablet 10 mg, 10 mg, Oral, Daily, Cleatus Delayne V, MD, 10 mg at 03/23/24 1226   dorzolamide -timolol  (COSOPT ) 2-0.5 % ophthalmic solution 1 drop, 1 drop, Both Eyes, BID, Trudy Anthony HERO, MD   gabapentin  (NEURONTIN ) capsule 100 mg, 100 mg, Oral, TID, Cleatus Delayne GAILS, MD   hydrALAZINE  (APRESOLINE ) injection 5 mg, 5 mg, Intravenous, Q4H PRN, Cleatus Delayne GAILS, MD, 5 mg at 03/23/24 0557   losartan  (COZAAR ) tablet 100 mg, 100 mg, Oral, Daily, Cleatus Delayne V, MD, 100 mg at 03/23/24 0015   potassium chloride  SA (KLOR-CON  M) CR tablet 40 mEq, 40 mEq, Oral, Daily, Trudy, Jamiese M, MD, 40 mEq at 03/23/24 9178   prednisoLONE  acetate (PRED FORTE ) 1 % ophthalmic suspension 1 drop, 1 drop, Left Eye, QID, Trudy Anthony M, MD   prochlorperazine (COMPAZINE) injection 5 mg, 5 mg, Intravenous, Q4H PRN, Cleatus Delayne GAILS, MD   rosuvastatin  (CRESTOR ) tablet 10 mg, 10 mg, Oral, Daily, Cleatus Delayne GAILS, MD, 10 mg at 03/23/24 9178   Family History  Problem Relation Age of Onset   Heart disease Father    Diabetes Mother    Diabetes Brother    Kidney disease Brother        Transplant   Heart disease Brother    Diabetes Sister    Diabetes Sister      Social History   Tobacco Use   Smoking status: Some Days    Current packs/day: 0.25    Average packs/day: 0.3 packs/day for 41.1 years (10.3 ttl pk-yrs)    Types: Cigarettes    Start date: 03/16/2000   Smokeless tobacco: Never  Vaping Use   Vaping status: Never Used  Substance Use Topics   Alcohol  use: Yes    Comment: Wine Ocassionally   Drug use: No    Allergies as of 03/22/2024 - Review Complete 03/22/2024  Allergen Reaction Noted   Other Itching 07/27/2014   Tapentadol hcl Itching 04/07/2018   Actos  [pioglitazone ]  06/24/2021   Penicillins Rash 02/18/2023    Review of Systems:    All systems reviewed and negative except where noted in  HPI.   Physical Exam:  Vital signs in last 24 hours: Temp:  [97.9 F (36.6 C)-98.9 F (37.2 C)] 98.4 F (36.9 C) (10/29 1211) Pulse Rate:  [55-68] 64 (10/29 1211) Resp:  [16-20] 20 (10/29 0510) BP: (123-204)/(72-99) 178/72 (10/29 1211) SpO2:  [86 %-100 %] 100 % (10/29 1211) Weight:  [52.2 kg-57.7 kg] 57.7 kg (10/28 2303) Last BM Date : 03/23/24 General:   Alert,  Well-developed, well-nourished, pleasant and cooperative in NAD Eyes:  Sclera clear, no icterus.   Conjunctiva pink. Lungs:  Respirations even and unlabored.  In mild distress speaking sentences Heart:  Regular rate and rhythm; no murmurs, clicks, rubs, or gallops. Abdomen:  Normal bowel sounds. Soft, non-tender and moderately distended, tympanic without masses, hepatosplenomegaly or hernias noted.  No guarding or rebound tenderness.   Rectal: Not performed Extremities:  No clubbing or edema.  No cyanosis. Neurologic:  Alert and oriented x3 Skin:  Intact without significant lesions or rashes. No jaundice. Psych:  Alert and cooperative. Normal mood and affect.  LAB RESULTS:    Latest Ref Rng & Units 03/23/2024    8:13 AM 03/23/2024    1:57 AM 03/22/2024    6:38 PM  CBC  WBC 4.0 - 10.5 K/uL 6.8  6.9  8.4   Hemoglobin 12.0 - 15.0 g/dL 89.2  89.8  88.5   Hematocrit 36.0 - 46.0 % 32.3  31.0  35.7   Platelets 150 - 400 K/uL 310  272  297     BMET    Latest Ref Rng & Units 03/23/2024    8:13 AM 03/23/2024    1:57 AM 03/22/2024    6:38 PM  BMP  Glucose 70 - 99 mg/dL 893  846  892   BUN 8 - 23 mg/dL 11  14  15    Creatinine 0.44 - 1.00 mg/dL 9.47  9.43  9.37   Sodium 135 - 145 mmol/L 145  142  142   Potassium 3.5 - 5.1 mmol/L 2.5  2.0  <2.0   Chloride 98 - 111 mmol/L 102  100  97   CO2 22 - 32 mmol/L 27  29  30    Calcium  8.9 - 10.3 mg/dL 7.4  7.1  7.7     LFT    Latest Ref Rng & Units 03/22/2024    6:38 PM 02/02/2024    2:38 PM 04/07/2023    9:54 PM  Hepatic Function  Total Protein 6.5 - 8.1 g/dL 6.5  6.0   6.8   Albumin 3.5 - 5.0 g/dL 2.9   2.7   AST 15 - 41 U/L 23  16  23    ALT 0 - 44 U/L 19  11  17    Alk Phosphatase 38 - 126 U/L 78   97   Total Bilirubin 0.0 - 1.2 mg/dL 0.9  0.7  1.0      STUDIES: No results found.    Impression / Plan:   Toree Edling is a 78 y.o. female with history of CHF, hypertension, hyperlipidemia, severe atherosclerotic vascular disease of lower extremities on Plavix  is admitted with worsening of chronic diarrhea resulting in severe hypokalemia, history of weight loss which has stabilized  Chronic diarrhea of unexplained etiology with hypokalemia and hypomagnesemia Stool studies negative for infection Replete serum potassium aggressively Check pancreatic fecal elastase levels, calprotectin, a.m. cortisol levels, serum PTH and thyroid  profile Check serum prealbumin levels, a.m. serum gastrin levels Recommend upper endoscopy as well as colonoscopy with biopsies for further evaluation after correction of electrolyte abnormalities Further workup of chronic diarrhea to evaluate for neuroendocrine tumors if endoluminal evaluation is unremarkable Clear liquid diet today MiraLAX bowel prep as tolerated with tentative plan for upper endoscopy and colonoscopy tomorrow pending improvement in respiratory status and hypokalemia  Exertional dyspnea With history of underlying CHF, and she received 1 L of IV fluids along with IV potassium, recommend chest x-ray to evaluate for any pulmonary venous congestion and need for diuresis  I have discussed alternative options, risks & benefits,  which include, but are not limited to, bleeding, infection, perforation,respiratory complication & drug reaction.  The patient agrees with this plan & written consent will be obtained.     Thank you for involving me in the care of this patient.  Dr. Jinny will cover from tomorrow    LOS: 0 days   Lisa Brooklyn, MD  03/23/2024, 2:07 PM  Note: This dictation was prepared with  Dragon dictation along with smaller phrase technology. Any transcriptional errors that result from this process are unintentional.

## 2024-03-23 NOTE — Plan of Care (Signed)
  Problem: Education: Goal: Knowledge of General Education information will improve Description: Including pain rating scale, medication(s)/side effects and non-pharmacologic comfort measures Outcome: Progressing   Problem: Clinical Measurements: Goal: Ability to maintain clinical measurements within normal limits will improve Outcome: Progressing Goal: Diagnostic test results will improve Outcome: Progressing Goal: Cardiovascular complication will be avoided Outcome: Progressing   Problem: Activity: Goal: Risk for activity intolerance will decrease Outcome: Progressing   Problem: Coping: Goal: Level of anxiety will decrease Outcome: Progressing   Problem: Elimination: Goal: Will not experience complications related to bowel motility Outcome: Progressing Goal: Will not experience complications related to urinary retention Outcome: Progressing   Problem: Safety: Goal: Ability to remain free from injury will improve Outcome: Progressing

## 2024-03-23 NOTE — Progress Notes (Signed)
 PROGRESS NOTE    Lisa [name]  FMW:[id number] DOB: [birth year] DOA: 03/22/2024 PCP: Sowles, Krichna, MD   Assessment & Plan:   Principal Problem:   Acute hypokalemia and hypomagnesemia secondary to diarrhea Active Problems:   Hypomagnesemia   Chronic diarrhea, with acute worsening   Anemia   Sinus bradycardia   Chronic heart failure with preserved ejection fraction (HFpEF) (HCC)   Type 2 diabetes mellitus with peripheral neuropathy (HCC)   Hypertension   History of DVT (deep vein thrombosis)   Urinary incontinence  Assessment and Plan: Hypokalemia: secondary to diarrhea. Potassium given. Repeat potassium ordered   Hypomagnesemia: secondary to diarrhea. Mg given. Repeat Mg level ordered    Chronic diarrhea: w/ acute worsening. GI PCR panel & c. Diff are both neg. GI consulted. Patient did not not follow through with colonoscopy nor fecal calprotectin study 06/2022.    Sinus bradycardia: holding home metoprolol . Continue on tele    Normocytic anemia: H&H are labile. No need for a transfusion currently    Chronic diastolic CHF: appears compensated. Holding metoprolol  secondary to bradycardia. Continue on home losartan , faxiga. Monitor I/Os    Urinary incontinence: continue w/ supportive care   HTN: continue on losartan . Holding metoprolol  as stated above   DM2: well controlled, HbA1c 6.4 Continue on SSI w/ accuchecks       DVT prophylaxis: lovenox  Code Status: full  Family Communication: Disposition Plan: depends on PT/OT recs   Level of care: Progressive  Status is: Observation The patient remains OBS appropriate and will d/c before 2 midnights.    Consultants:  GI   Procedures:   Antimicrobials:    Subjective: Pt c/o diarrhea  Objective: Vitals:   03/23/24 0000 03/23/24 0200 03/23/24 0510 03/23/24 0805  BP: (!) 180/90 (!) 170/90 (!) 180/74 (!) 184/82  Pulse:   (!) 59 68  Resp:   20   Temp:   98.4 F (36.9 C) 97.9 F (36.6 C)  TempSrc:     Oral  SpO2: 100%  93% 92%  Weight:      Height:        Intake/Output Summary (Last 24 hours) at 03/23/2024 1014 Last data filed at 03/22/2024 2153 Gross per 24 hour  Intake 1100 ml  Output --  Net 1100 ml   Filed Weights   03/22/24 1833 03/22/24 2303  Weight: 52.2 kg 57.7 kg    Examination:  General exam: Appears calm and comfortable  Respiratory system: Clear to auscultation. Respiratory effort normal. Cardiovascular system: S1 & S2+. No  rubs, gallops or clicks.  Gastrointestinal system: Abdomen is nondistended, soft and nontender. Hyperactive bowel sounds heard. Central nervous system: Alert and oriented. Moves all extremities Psychiatry: Judgement and insight appear normal.Flat mood and affect    Data Reviewed: I have personally reviewed following labs and imaging studies  CBC: Recent Labs  Lab 03/22/24 1838 03/23/24 0157 03/23/24 0813  WBC 8.4 6.9 6.8  HGB 11.4* 10.1* 10.7*  HCT 35.7* 31.0* 32.3*  MCV 84.2 82.4 82.0  PLT 297 272 310   Basic Metabolic Panel: Recent Labs  Lab 03/22/24 1838 03/23/24 0157 03/23/24 0813  NA 142 142 145  K <2.0* 2.0* 2.5*  CL 97* 100 102  CO2 30 29 27   GLUCOSE 107* 153* 106*  BUN 15 14 11   CREATININE 0.62 0.56 0.52  CALCIUM  7.7* 7.1* 7.4*  MG 1.4* 1.6*  --   PHOS  --  2.7  --    GFR: Estimated Creatinine Clearance: 50 mL/min (by  C-G formula based on SCr of 0.52 mg/dL). Liver Function Tests: Recent Labs  Lab 03/22/24 1838  AST 23  ALT 19  ALKPHOS 78  BILITOT 0.9  PROT 6.5  ALBUMIN 2.9*   Recent Labs  Lab 03/22/24 1838  LIPASE 26   No results for input(s): AMMONIA in the last 168 hours. Coagulation Profile: No results for input(s): INR, PROTIME in the last 168 hours. Cardiac Enzymes: No results for input(s): CKTOTAL, CKMB, CKMBINDEX, TROPONINI in the last 168 hours. BNP (last 3 results) No results for input(s): PROBNP in the last 8760 hours. HbA1C: No results for input(s): HGBA1C  in the last 72 hours. CBG: No results for input(s): GLUCAP in the last 168 hours. Lipid Profile: No results for input(s): CHOL, HDL, LDLCALC, TRIG, CHOLHDL, LDLDIRECT in the last 72 hours. Thyroid  Function Tests: No results for input(s): TSH, T4TOTAL, FREET4, T3FREE, THYROIDAB in the last 72 hours. Anemia Panel: No results for input(s): VITAMINB12, FOLATE, FERRITIN, TIBC, IRON, RETICCTPCT in the last 72 hours. Sepsis Labs: No results for input(s): PROCALCITON, LATICACIDVEN in the last 168 hours.  Recent Results (from the past 240 hours)  Gastrointestinal Panel by PCR , Stool     Status: None   Collection Time: 03/22/24 11:50 PM   Specimen: Stool  Result Value Ref Range Status   Campylobacter species NOT DETECTED NOT DETECTED Final   Plesimonas shigelloides NOT DETECTED NOT DETECTED Final   Salmonella species NOT DETECTED NOT DETECTED Final   Yersinia enterocolitica NOT DETECTED NOT DETECTED Final   Vibrio species NOT DETECTED NOT DETECTED Final   Vibrio cholerae NOT DETECTED NOT DETECTED Final   Enteroaggregative E coli (EAEC) NOT DETECTED NOT DETECTED Final   Enteropathogenic E coli (EPEC) NOT DETECTED NOT DETECTED Final   Enterotoxigenic E coli (ETEC) NOT DETECTED NOT DETECTED Final   Shiga like toxin producing E coli (STEC) NOT DETECTED NOT DETECTED Final   Shigella/Enteroinvasive E coli (EIEC) NOT DETECTED NOT DETECTED Final   Cryptosporidium NOT DETECTED NOT DETECTED Final   Cyclospora cayetanensis NOT DETECTED NOT DETECTED Final   Entamoeba histolytica NOT DETECTED NOT DETECTED Final   Giardia lamblia NOT DETECTED NOT DETECTED Final   Adenovirus F40/41 NOT DETECTED NOT DETECTED Final   Astrovirus NOT DETECTED NOT DETECTED Final   Norovirus GI/GII NOT DETECTED NOT DETECTED Final   Rotavirus A NOT DETECTED NOT DETECTED Final   Sapovirus (I, II, IV, and V) NOT DETECTED NOT DETECTED Final    Comment: Performed at [facility],  [address] [place], [place] [address]  C Difficile Quick Screen w PCR reflex     Status: None   Collection Time: 03/22/24 11:50 PM   Specimen: STOOL  Result Value Ref Range Status   C Diff antigen NEGATIVE NEGATIVE Final   C Diff toxin NEGATIVE NEGATIVE Final   C Diff interpretation No C. difficile detected.  Final    Comment: Performed at [facility], [address], [place], [place] [address]         Radiology Studies: No results found.      Scheduled Meds:  dapagliflozin  propanediol  10 mg Oral Daily   gabapentin   100 mg Oral TID   losartan   100 mg Oral Daily   potassium chloride  SA  40 mEq Oral Daily   rosuvastatin   10 mg Oral Daily   Continuous Infusions:   LOS: 0 days      Anthony CHRISTELLA Pouch, MD [facility] Pager [phone]  If 7PM-7AM, please  contact night-coverage [email or web address] 03/23/2024, 10:14 AM

## 2024-03-23 NOTE — Care Management Obs Status (Signed)
 MEDICARE OBSERVATION STATUS NOTIFICATION   Patient Details  Name: Lisa [name] MRN: [id number] Date of Birth: [birth year]   Medicare Observation Status Notification Given:       [name] 03/23/2024, 12:24 PM

## 2024-03-24 ENCOUNTER — Encounter: Payer: Self-pay | Admitting: Gastroenterology

## 2024-03-24 ENCOUNTER — Inpatient Hospital Stay: Admitting: Anesthesiology

## 2024-03-24 ENCOUNTER — Encounter
Admission: EM | Disposition: A | Discharge status: Home Health | Payer: Self-pay | Source: Home / Self Care | Attending: Internal Medicine

## 2024-03-24 DIAGNOSIS — K449 Diaphragmatic hernia without obstruction or gangrene: Secondary | ICD-10-CM | POA: Diagnosis not present

## 2024-03-24 DIAGNOSIS — R197 Diarrhea, unspecified: Secondary | ICD-10-CM

## 2024-03-24 DIAGNOSIS — K529 Noninfective gastroenteritis and colitis, unspecified: Principal | ICD-10-CM

## 2024-03-24 HISTORY — PX: ESOPHAGOGASTRODUODENOSCOPY: SHX5428

## 2024-03-24 HISTORY — PX: COLONOSCOPY: SHX5424

## 2024-03-24 LAB — PARATHYROID HORMONE, INTACT (NO CA): PTH: 71 pg/mL — ABNORMAL HIGH (ref 15–65)

## 2024-03-24 LAB — BASIC METABOLIC PANEL WITH GFR
Anion gap: 11 (ref 5–15)
BUN: 8 mg/dL (ref 8–23)
CO2: 27 mmol/L (ref 22–32)
Calcium: 7 mg/dL — ABNORMAL LOW (ref 8.9–10.3)
Chloride: 102 mmol/L (ref 98–111)
Creatinine, Ser: 0.48 mg/dL (ref 0.44–1.00)
GFR, Estimated: >60 mL/min (ref >60)
Glucose, Bld: 115 mg/dL — ABNORMAL HIGH (ref 70–99)
Potassium: 2.7 mmol/L — CL (ref 3.5–5.1)
Sodium: 140 mmol/L (ref 135–145)

## 2024-03-24 LAB — CBC
HCT: 30.2 % — ABNORMAL LOW (ref 36.0–46.0)
Hemoglobin: 10 g/dL — ABNORMAL LOW (ref 12.0–15.0)
MCH: 27.2 pg (ref 26.0–34.0)
MCHC: 33.1 g/dL (ref 30.0–36.0)
MCV: 82.1 fL (ref 80.0–100.0)
Platelets: 272 K/uL (ref 150–400)
RBC: 3.68 MIL/uL — ABNORMAL LOW (ref 3.87–5.11)
RDW: 18.1 % — ABNORMAL HIGH (ref 11.5–15.5)
WBC: 5.9 K/uL (ref 4.0–10.5)
nRBC: 0 % (ref 0.0–0.2)

## 2024-03-24 LAB — THYROID PANEL WITH TSH
Free Thyroxine Index: 1.6 (ref 1.2–4.9)
T3 Uptake Ratio: 27 % (ref 24–39)
T4, Total: 5.9 ug/dL (ref 4.5–12.0)
TSH: 3.54 u[IU]/mL (ref 0.450–4.500)

## 2024-03-24 LAB — POTASSIUM: Potassium: 3.3 mmol/L — ABNORMAL LOW (ref 3.5–5.1)

## 2024-03-24 LAB — CORTISOL-AM, BLOOD: Cortisol - AM: 13.8 ug/dL (ref 6.7–22.6)

## 2024-03-24 MED ORDER — LIDOCAINE HCL (PF) 2 % IJ SOLN
INTRAMUSCULAR | Status: AC
  Filled 2024-03-24: qty 5

## 2024-03-24 MED ORDER — LOPERAMIDE HCL 2 MG PO CAPS
2.0000 mg | ORAL_CAPSULE | ORAL | Status: DC | PRN
  Administered 2024-03-24 – 2024-03-28 (×4): 2 mg via ORAL
  Filled 2024-03-24 (×5): qty 1

## 2024-03-24 MED ORDER — DEXMEDETOMIDINE HCL IN NACL 80 MCG/20ML IV SOLN
INTRAVENOUS | Status: DC | PRN
Start: 2024-03-24 — End: 2024-03-24
  Administered 2024-03-24: 4 ug via INTRAVENOUS
  Administered 2024-03-24 (×2): 8 ug via INTRAVENOUS

## 2024-03-24 MED ORDER — LIDOCAINE HCL (CARDIAC) PF 100 MG/5ML IV SOSY
PREFILLED_SYRINGE | INTRAVENOUS | Status: DC | PRN
  Administered 2024-03-24: 60 mg via INTRAVENOUS

## 2024-03-24 MED ORDER — PROPOFOL 500 MG/50ML IV EMUL
INTRAVENOUS | Status: DC | PRN
Start: 2024-03-24 — End: 2024-03-24
  Administered 2024-03-24: 50 ug/kg/min via INTRAVENOUS

## 2024-03-24 MED ORDER — PROPOFOL 10 MG/ML IV BOLUS
INTRAVENOUS | Status: DC | PRN
  Administered 2024-03-24: 20 mg via INTRAVENOUS
  Administered 2024-03-24 (×2): 40 mg via INTRAVENOUS

## 2024-03-24 MED ORDER — POTASSIUM CHLORIDE CRYS ER 20 MEQ PO TBCR
60.0000 meq | EXTENDED_RELEASE_TABLET | ORAL | Status: AC
  Administered 2024-03-24: 60 meq via ORAL
  Filled 2024-03-24: qty 3

## 2024-03-24 MED ORDER — POTASSIUM CHLORIDE CRYS ER 20 MEQ PO TBCR
40.0000 meq | EXTENDED_RELEASE_TABLET | Freq: Once | ORAL | Status: AC
  Administered 2024-03-24: 40 meq via ORAL
  Filled 2024-03-24: qty 2

## 2024-03-24 MED ORDER — DEXMEDETOMIDINE HCL IN NACL 80 MCG/20ML IV SOLN: INTRAVENOUS | Status: DC | PRN

## 2024-03-24 MED ORDER — SODIUM CHLORIDE 0.9 % IV SOLN: INTRAVENOUS | Status: DC

## 2024-03-24 NOTE — Discharge Instructions (Signed)
 Agency Name: [facility] Address: [address] [place], [place] [address] Phone: [phone] Website: [email or web address] Service(s) Offered: Housing services, self-sufficiency, congregate meal program, and individual development account program.  Agency Name: [employer] Address: [address] [place], [place] [address] Phone: [phone] Email: [email or web address] Website: [email or web address] Service(s) Offered: Housing the homeless, feeding the hungry, Company secretary, job and education related services.  Agency Name: [facility] Address: [address], [place], [place] [address] Phone: [phone] Email: [email or web address] Service(s) Offered: Counseling, problem pregnancy, advocacy for Hispanics, limited emergency financial assistance.  Agency Name: [employer] Address: [address] [place], [place] [address] Phone: [phone] Website: [email or web address] Service(s) Offered: Child support services; child welfare services; SNAP; Medicaid; work first family assistance; and aid with fuel,  rent, food and medicine.  Agency Name: [employer] Address: [address], [place], [place] [address] Phone: [phone] or [phone] Email: [email or web address] Service(s) Offered: Family services and transient assistance; emergency food, fuel, clothing, limited furniture, utilities; budget counseling, general counseling; give a kid a coat; thrift store; Christmas food and toys. Utility assistance, food pantry, rental  assistance, life sustaining medicine

## 2024-03-24 NOTE — Transfer of Care (Signed)
 Immediate Anesthesia Transfer of Care Note  Patient: Lisa [name]  Procedure(s) Performed: EGD (ESOPHAGOGASTRODUODENOSCOPY) COLONOSCOPY  Patient Location: PACU  Anesthesia Type:General  Level of Consciousness: sedated  Airway & Oxygen Therapy: Patient Spontanous Breathing  Post-op Assessment: Report given to RN and Post -op Vital signs reviewed and stable  Post vital signs: Reviewed and stable  Last Vitals:  Vitals Value Taken Time  BP 143/76 03/24/24 13:06  Temp    Pulse 63 03/24/24 13:06  Resp 17 03/24/24 13:06  SpO2 91 % 03/24/24 13:06  Vitals shown include unfiled device data.  Last Pain:  Vitals:   03/24/24 1127  TempSrc: Oral  PainSc:          Complications: No notable events documented.

## 2024-03-24 NOTE — TOC Initial Note (Addendum)
 Transition of Care [facility]) - Initial/Assessment Note    Patient Details  Name: Lisa [name] MRN: [id number] Date of Birth: [birth year]  Transition of Care [employer] [facility]) CM/SW Contact:    Lauraine JAYSON Carpen, LCSW Phone Number: 03/24/2024, 4:24 PM  Clinical Narrative:  CSW met with patient. No family at bedside. CSW introduced role and explained that therapy recommendations would be discussed. She is agreeable to home health. No agency preference. Will provide offers once available. She is agreeable to a 3-in-1. Patient asked about having someone come help her with laundry. She stated she does not have Medicaid. CSW explained that she would have to private pay for that. Patient said she knows someone that has her utilities paid for and she wants that as well. Added utility resources to her AVS. Also will see if we can get her a home health social worker. No further concerns. CSW will continue to follow patient for support and facilitate return home once stable. Her brother, sister, or friend will likely transport her home at discharge.               4:54 pm: Ordered 3-in-1 through [employer].  Expected Discharge Plan: Home w Home Health Services Barriers to Discharge: Continued Medical Work up   Patient Goals and CMS Choice            Expected Discharge Plan and Services     Post Acute Care Choice: Durable Medical Equipment, Home Health Living arrangements for the past 2 months: Single Family Home                                      Prior Living Arrangements/Services Living arrangements for the past 2 months: Single Family Home Lives with:: Self Patient language and need for interpreter reviewed:: Yes Do you feel safe going back to the place where you live?: Yes      Need for Family Participation in Patient Care: Yes (Comment) Care giver support system in place?: Yes (comment)   Criminal Activity/Legal Involvement Pertinent to Current Situation/Hospitalization: No - Comment as  needed  Activities of Daily Living   ADL Screening (condition at time of admission) Independently performs ADLs?: Yes (appropriate for developmental age) Is the patient deaf or have difficulty hearing?: No Does the patient have difficulty seeing, even when wearing glasses/contacts?: No Does the patient have difficulty concentrating, remembering, or making decisions?: No  Permission Sought/Granted Permission sought to share information with : Facility Industrial/product Designer granted to share information with : Yes, Verbal Permission Granted     Permission granted to share info w AGENCY: Home health agencies        Emotional Assessment Appearance:: Appears stated age Attitude/Demeanor/Rapport: Engaged Affect (typically observed): Accepting Orientation: : Oriented to Self, Oriented to Place, Oriented to  Time, Oriented to Situation Alcohol  / Substance Use: Not Applicable Psych Involvement: No (comment)  Admission diagnosis:  Hypokalemia [E87.6] Acute hypokalemia [E87.6] Hypomagnesemia [E83.42] Diarrhea, unspecified type [R19.7] Patient Active Problem List   Diagnosis Date Noted   Diarrhea 03/24/2024   Urinary incontinence 03/23/2024   Acute hypokalemia and hypomagnesemia secondary to diarrhea 03/22/2024   Hypomagnesemia 03/22/2024   Chronic diarrhea, with acute worsening 03/22/2024   History of DVT (deep vein thrombosis) 03/22/2024   Anemia 03/22/2024   Sinus bradycardia 03/22/2024   Hypertensive urgency 04/08/2023   Malnutrition of moderate degree 04/08/2023   Unilateral amputation of left foot (HCC) 02/19/2022  Hypertension 02/19/2022   Chronic heart failure with preserved ejection fraction (HFpEF) (HCC) 04/09/2021   Nicotine  dependence 04/09/2021   Syncope and collapse 02/26/2021   Prolonged QT interval 02/26/2021   DM (diabetes mellitus), type 2 with peripheral vascular complications (HCC) 09/25/2015   Amputation of left foot (HCC) 12/02/2014   Benign  essential HTN 12/02/2014   Depression, major, recurrent, mild (HCC) 12/02/2014   Dyslipidemia 12/02/2014   Vitamin B12 deficiency 12/02/2014   Peripheral neuropathic pain 12/02/2014   Atherosclerosis of native arteries of extremity with rest pain (HCC) 12/02/2014   Phantom limb (HCC) 12/02/2014   Diabetes mellitus with nephropathy (HCC) 12/02/2014   Detached retina 12/02/2014   Type 2 diabetes mellitus with peripheral neuropathy (HCC) 12/02/2014   Tobacco use 12/02/2014   Urge incontinence 12/02/2014   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   Vitamin D  deficiency 12/02/2014   PCP:  Glenard Mire, MD Pharmacy:   [facility] [phone] [address], [place] - [address] [place] [address] Phone: [phone] Fax: [phone]     Social Drivers of Health (SDOH) Social History: SDOH Screenings   Food Insecurity: No Food Insecurity (03/23/2024)  Housing: Low Risk  (03/23/2024)  Transportation Needs: No Transportation Needs (03/23/2024)  Utilities: Not At Risk (03/23/2024)  Alcohol  Screen: Low Risk  (02/23/2023)  Depression (PHQ2-9): Low Risk  (02/02/2024)  Financial Resource Strain: Medium Risk (04/08/2023)  Physical Activity: Insufficiently Active (03/26/2021)  Social Connections: Socially Isolated (03/23/2024)  Stress: No Stress Concern Present (03/26/2021)  Tobacco Use: High Risk (03/22/2024)   SDOH Interventions:     Readmission Risk Interventions     No data to display

## 2024-03-24 NOTE — Evaluation (Signed)
 Occupational Therapy Evaluation Patient Details Name: Lisa [name] MRN: [id number] DOB: 07-22-1945 Today's Date: 03/24/2024   History of Present Illness   Pt is a 78 y.o. female presented with worsening of diarrhea for 3 days resulting in severe acute hypokalemia and hypomagnesemia requiring admission. PMH of diabetes, DVT, CHF, hypertension, hyperlipidemia with history of chronic diarrhea resulting in electrolyte abnormalities, admission in 03/2023.     Clinical Impressions Pt was seen for OT evaluation this date. PTA, pt resides in a one level home by herself with family members in/out per her report. At baseline, she is IND with ADLs/IADLs and reports intermittent use of SPC for household distances and use of motorized cart in grocery store/limited community mobility.  Pt presents with deficits in strength and activity tolerance, affecting safe and optimal ADL completion. Pt currently requires MOD I for bed mobility and supervision for STS and SPT to [facility] during session. Pt able to perform standing peri-care with superversion and unilateral support on BSC rail with no LOB. Pt on RA throughout session and difficulty to get reading on (99% at rest in bed and 84% after activity). Pt was edu on ECS, pacing and implementation of rest breaks to prevent overexertion. Would benefit from further education. Handoff to PT. Will follow for acute OT services as pt is currently slightly below her baseline function and recommend follow up therapy to maximize return to PLOF.     If plan is discharge home, recommend the following:   A little help with walking and/or transfers;A little help with bathing/dressing/bathroom;Help with stairs or ramp for entrance;Assistance with cooking/housework     Functional Status Assessment   Patient has had a recent decline in their functional status and demonstrates the ability to make significant improvements in function in a reasonable and predictable amount  of time.     Equipment Recommendations   BSC/3in1     Recommendations for Other Services         Precautions/Restrictions   Precautions Precautions: Fall Recall of Precautions/Restrictions: Intact Restrictions Weight Bearing Restrictions Per Provider Order: No     Mobility Bed Mobility Overal bed mobility: Modified Independent                  Transfers Overall transfer level: Modified independent Equipment used: None Transfers: Sit to/from Stand Sit to Stand: Supervision           General transfer comment: stood from EOB and BSC as well as performed standing peri-care with supervision, no LOB; difficult to get accurate reading for sp02 99% at rest in bed on RA and 84% seated in recliner at end of session with handoff to PT      Balance Overall balance assessment: Needs assistance Sitting-balance support: No upper extremity supported, Feet supported Sitting balance-Leahy Scale: Normal     Standing balance support: During functional activity, Single extremity supported Standing balance-Leahy Scale: Good Standing balance comment: no LOB with unilateral support standing peri-care                           ADL either performed or assessed with clinical judgement   ADL Overall ADL's : Needs assistance/impaired     Grooming: Wash/dry hands;Standing;Supervision/safety                   Toilet Transfer: Supervision/safety;BSC/3in1;Stand-pivot   Toileting- Architect and Hygiene: Supervision/safety;Sit to/from stand;Sitting/lateral lean Toileting - Clothing Manipulation Details (indicate cue type and reason): standing peri-care after  incont BMs on bowel prep     Functional mobility during ADLs: Supervision/safety       Vision         Perception         Praxis         Pertinent Vitals/Pain Pain Assessment Pain Assessment: Faces Faces Pain Scale: Hurts even more Pain Location: buttocks Pain Descriptors /  Indicators: Tender, Sore Pain Intervention(s): Limited activity within patient's tolerance, Monitored during session, Repositioned     Extremity/Trunk Assessment Upper Extremity Assessment Upper Extremity Assessment: Overall WFL for tasks assessed   Lower Extremity Assessment Lower Extremity Assessment: Generalized weakness;Overall WFL for tasks assessed LLE Deficits / Details: transmetatarsal amputation previously   Cervical / Trunk Assessment Cervical / Trunk Assessment: Normal   Communication Communication Communication: No apparent difficulties   Cognition Arousal: Alert Behavior During Therapy: WFL for tasks assessed/performed                                 Following commands: Intact       Cueing  General Comments   Cueing Techniques: Verbal cues  pt on RA on entry and left on RA with difficulty getting reading, pt appeared stable   Exercises Other Exercises Other Exercises: Edu on role of OT in acute setting, ECS, therapuetic rest breaks, etc.   Shoulder Instructions      Home Living Family/patient expects to be discharged to:: Private residence Living Arrangements: Alone Available Help at Discharge: Family;Available PRN/intermittently Type of Home: House Home Access: Stairs to enter;Ramped entrance (Pt reports she only uses ramped entrance) Entrance Stairs-Number of Steps: 2 Entrance Stairs-Rails: Right;Left;Can reach both Home Layout: One level     Bathroom Shower/Tub: Walk-in shower;Tub/shower unit   Bathroom Toilet: Handicapped height     Home Equipment: Agricultural Consultant (2 wheels);Cane - single point;Shower seat - built in          Prior Functioning/Environment Prior Level of Function : Independent/Modified Independent             Mobility Comments: Reports use of SPC ADLs Comments: IND with ADLs, reports minimal driving, will be selling her car, blind in R eye    OT Problem List: Decreased strength;Decreased activity  tolerance   OT Treatment/Interventions: Self-care/ADL training;Therapeutic exercise;Therapeutic activities;DME and/or AE instruction;Energy conservation;Patient/family education;Balance training      OT Goals(Current goals can be found in the care plan section)   Acute Rehab OT Goals Patient Stated Goal: get stronger OT Goal Formulation: With patient Time For Goal Achievement: 04/07/24 Potential to Achieve Goals: Good ADL Goals Pt Will Perform Lower Body Dressing: with modified independence;sitting/lateral leans;sit to/from stand Pt Will Transfer to Toilet: with modified independence;ambulating Additional ADL Goal #1: Pt will demo implementation of 1 learned ECS during ADL performance 2/2 trials to maximize safety/IND and prevent overexertion.   OT Frequency:  Min 1X/week    Co-evaluation              AM-PAC OT 6 Clicks Daily Activity     Outcome Measure Help from another person eating meals?: None Help from another person taking care of personal grooming?: None Help from another person toileting, which includes using toliet, bedpan, or urinal?: A Little Help from another person bathing (including washing, rinsing, drying)?: A Little Help from another person to put on and taking off regular upper body clothing?: A Little Help from another person to put on and taking off regular lower  body clothing?: A Little 6 Click Score: 20   End of Session Nurse Communication: Mobility status  Activity Tolerance: Patient tolerated treatment well Patient left: in chair;with call bell/phone within reach;with chair alarm set  OT Visit Diagnosis: Other abnormalities of gait and mobility (R26.89);Muscle weakness (generalized) (M62.81)                Time: 9175-9141 OT Time Calculation (min): 34 min Charges:  OT General Charges $OT Visit: 1 Visit OT Evaluation $OT Eval Low Complexity: 1 Low OT Treatments $Self Care/Home Management : 8-22 mins Mavis Gravelle, OTR/L 03/24/24, 12:20  PM  Spenser Cong E Norris Brumbach 03/24/2024, 12:16 PM

## 2024-03-24 NOTE — Progress Notes (Signed)
 PROGRESS NOTE    Lisa [name]  FMW:[id number] DOB: 1945-09-28 DOA: 03/22/2024 PCP: Sowles, Krichna, MD   Assessment & Plan:   Principal Problem:   Acute hypokalemia and hypomagnesemia secondary to diarrhea Active Problems:   Hypomagnesemia   Chronic diarrhea, with acute worsening   Anemia   Sinus bradycardia   Chronic heart failure with preserved ejection fraction (HFpEF) (HCC)   Type 2 diabetes mellitus with peripheral neuropathy (HCC)   Hypertension   History of DVT (deep vein thrombosis)   Urinary incontinence   Diarrhea  Assessment and Plan: Hypokalemia: secondary to diarrhea. Potassium given but repeat level is still low but improved. Will give more potassium   Hypomagnesemia: secondary to diarrhea. Mg given. Repeat Mg level ordered    Chronic diarrhea: w/ acute worsening. GI PCR panel & c. Diff are both neg. S/p EGD which showed small hiatal hernia, normal stomach & normal examined duodenum. S/p colonoscopy showed a large amount of stool in the entire colon precluding visualization, biopsies taken, colon prep was poor. If biopsies are non-diagnostic, pt will need another colonoscopy with a better prep as per GI. Patient did not not follow through with colonoscopy nor fecal calprotectin study 06/2022.    Sinus bradycardia: holding home metoprolol . Continue on tele   Normocytic anemia: H&H are labile. Will transfuse if Hb < 7.0   Chronic diastolic CHF: appears compensated. Holding metoprolol  secondary to bradycardia. Continue on home dose of losartan , farxiga . Monitor I/Os  Urinary incontinence: continue w/ supportive care   HTN: continue on losartan . Holding BB as stated above  DM2: well controlled, HbA1c 6.4 SSI was d/c      DVT prophylaxis: lovenox  Code Status: full  Family Communication: Disposition Plan: d/c back home w/ HH   Level of care: Progressive  Status is: Inpatient Remains inpatient appropriate because: severity of illness, s/p EGD and  colonoscopy today       Consultants:  GI   Procedures:   Antimicrobials:    Subjective: Pt c/o anus pain and being hungry  Objective: Vitals:   03/24/24 1317 03/24/24 1327 03/24/24 1337 03/24/24 1410  BP: 130/69 119/67 128/68 (!) 164/88  Pulse: (!) 59 (!) 56 (!) 56 60  Resp: 17 16 16    Temp:    97.7 F (36.5 C)  TempSrc:    Oral  SpO2: 92% 92% 92% 94%  Weight:      Height:        Intake/Output Summary (Last 24 hours) at 03/24/2024 1533 Last data filed at 03/24/2024 1302 Gross per 24 hour  Intake 200 ml  Output 0 ml  Net 200 ml   Filed Weights   03/22/24 1833 03/22/24 2303  Weight: 52.2 kg 57.7 kg    Examination:  General exam: appears frustrated  Respiratory system: clear breath sounds b/l  Cardiovascular system: S1/S2+. No rubs or gallops.  Gastrointestinal system: Abd is soft, NT, ND & hyperactive bowel sounds Central nervous system: alert & awake. Moves all extremities Psychiatry: Judgement and insight appears at baseline. Flat mood and affect     Data Reviewed: I have personally reviewed following labs and imaging studies  CBC: Recent Labs  Lab 03/22/24 1838 03/23/24 0157 03/23/24 0813 03/24/24 0135  WBC 8.4 6.9 6.8 5.9  HGB 11.4* 10.1* 10.7* 10.0*  HCT 35.7* 31.0* 32.3* 30.2*  MCV 84.2 82.4 82.0 82.1  PLT 297 272 310 272   Basic Metabolic Panel: Recent Labs  Lab 03/22/24 1838 03/23/24 0157 03/23/24 0813 03/23/24 1439 03/23/24 1526  03/23/24 1942 03/24/24 0135 03/24/24 1054  NA 142 142 145 143  --  141 140  --   K <2.0* 2.0* 2.5* 2.4* 2.4* 2.2* 2.7* 3.3*  CL 97* 100 102 104  --  103 102  --   CO2 30 29 27 28   --  26 27  --   GLUCOSE 107* 153* 106* 152*  --  164* 115*  --   BUN 15 14 11 10   --  9 8  --   CREATININE 0.62 0.56 0.52 0.67  --  0.72 0.48  --   CALCIUM  7.7* 7.1* 7.4* 7.4*  --  7.3* 7.0*  --   MG 1.4* 1.6*  --   --  1.9  --   --   --   PHOS  --  2.7  --   --   --   --   --   --    GFR: Estimated Creatinine  Clearance: 50 mL/min (by C-G formula based on SCr of 0.48 mg/dL). Liver Function Tests: Recent Labs  Lab 03/22/24 1838  AST 23  ALT 19  ALKPHOS 78  BILITOT 0.9  PROT 6.5  ALBUMIN 2.9*   Recent Labs  Lab 03/22/24 1838  LIPASE 26   No results for input(s): AMMONIA in the last 168 hours. Coagulation Profile: No results for input(s): INR, PROTIME in the last 168 hours. Cardiac Enzymes: No results for input(s): CKTOTAL, CKMB, CKMBINDEX, TROPONINI in the last 168 hours. BNP (last 3 results) No results for input(s): PROBNP in the last 8760 hours. HbA1C: No results for input(s): HGBA1C in the last 72 hours. CBG: No results for input(s): GLUCAP in the last 168 hours. Lipid Profile: No results for input(s): CHOL, HDL, LDLCALC, TRIG, CHOLHDL, LDLDIRECT in the last 72 hours. Thyroid  Function Tests: Recent Labs    03/23/24 1439  TSH 3.540  T4TOTAL 5.9   Anemia Panel: No results for input(s): VITAMINB12, FOLATE, FERRITIN, TIBC, IRON, RETICCTPCT in the last 72 hours. Sepsis Labs: No results for input(s): PROCALCITON, LATICACIDVEN in the last 168 hours.  Recent Results (from the past 240 hours)  Gastrointestinal Panel by PCR , Stool     Status: None   Collection Time: 03/22/24 11:50 PM   Specimen: Stool  Result Value Ref Range Status   Campylobacter species NOT DETECTED NOT DETECTED Final   Plesimonas shigelloides NOT DETECTED NOT DETECTED Final   Salmonella species NOT DETECTED NOT DETECTED Final   Yersinia enterocolitica NOT DETECTED NOT DETECTED Final   Vibrio species NOT DETECTED NOT DETECTED Final   Vibrio cholerae NOT DETECTED NOT DETECTED Final   Enteroaggregative E coli (EAEC) NOT DETECTED NOT DETECTED Final   Enteropathogenic E coli (EPEC) NOT DETECTED NOT DETECTED Final   Enterotoxigenic E coli (ETEC) NOT DETECTED NOT DETECTED Final   Shiga like toxin producing E coli (STEC) NOT DETECTED NOT DETECTED Final    Shigella/Enteroinvasive E coli (EIEC) NOT DETECTED NOT DETECTED Final   Cryptosporidium NOT DETECTED NOT DETECTED Final   Cyclospora cayetanensis NOT DETECTED NOT DETECTED Final   Entamoeba histolytica NOT DETECTED NOT DETECTED Final   Giardia lamblia NOT DETECTED NOT DETECTED Final   Adenovirus F40/41 NOT DETECTED NOT DETECTED Final   Astrovirus NOT DETECTED NOT DETECTED Final   Norovirus GI/GII NOT DETECTED NOT DETECTED Final   Rotavirus A NOT DETECTED NOT DETECTED Final   Sapovirus (I, II, IV, and V) NOT DETECTED NOT DETECTED Final    Comment: Performed at [facility], [address] [place], [place] [address]  C Difficile Quick Screen w PCR reflex     Status: None   Collection Time: 03/22/24 11:50 PM   Specimen: STOOL  Result Value Ref Range Status   C Diff antigen NEGATIVE NEGATIVE Final   C Diff toxin NEGATIVE NEGATIVE Final   C Diff interpretation No C. difficile detected.  Final    Comment: Performed at [facility], [address], [place], [place] [address]         Radiology Studies: DG Chest [place] 1 View Result Date: 03/23/2024 EXAM: 1 VIEW(S) XRAY OF THE CHEST 03/23/2024 03:40:00 PM COMPARISON: Chest x-ray 04/07/2023. CLINICAL HISTORY: Dyspnea. FINDINGS: LUNGS AND PLEURA: There is central pulmonary vascular congestion. No focal pulmonary opacity. No pulmonary edema. No pleural effusion. No pneumothorax. HEART AND MEDIASTINUM: The heart is enlarged. The aorta is ectatic with atherosclerotic calcification. BONES AND SOFT TISSUES: No acute osseous abnormality. IMPRESSION: 1. Enlarged heart. 2. Central pulmonary vascular congestion. Electronically signed by: Greig Pique MD 03/23/2024 07:05 PM EDT RP Workstation: [id number]        Scheduled Meds:  atropine   1 drop Left Eye TID   brimonidine   1 drop Both Eyes BID   dapagliflozin  propanediol  10 mg Oral Daily   dorzolamide -timolol   1 drop Both Eyes BID   losartan   100 mg Oral Daily   potassium  chloride  40 mEq Oral Once   prednisoLONE  acetate  1 drop Left Eye QID   rosuvastatin   10 mg Oral Daily   Continuous Infusions:   LOS: 1 day      Anthony CHRISTELLA Pouch, MD [facility] Pager [phone]  If 7PM-7AM, please contact night-coverage [email or web address] 03/24/2024, 3:33 PM

## 2024-03-24 NOTE — Op Note (Signed)
 [facility] Gastroenterology Patient Name: Lisa [name] Procedure Date: 03/24/2024 11:49 AM MRN: [id number] Account #: [id number] Date of Birth: 06/25/1945 Admit Type: Inpatient Age: 78 Room: [facility] ENDO ROOM 4 Gender: Female Note Status: Finalized Instrument Name: Upper GI Scope [id number] Procedure:             Upper GI endoscopy Indications:           Diarrhea Providers:             Rogelia Copping MD, MD Referring MD:          No Local Md, MD (Referring MD) Medicines:             Propofol per Anesthesia Complications:         No immediate complications. Procedure:             Pre-Anesthesia Assessment:                        - Prior to the procedure, a History and Physical was                         performed, and patient medications and allergies were                         reviewed. The patient's tolerance of previous                         anesthesia was also reviewed. The risks and benefits                         of the procedure and the sedation options and risks                         were discussed with the patient. All questions were                         answered, and informed consent was obtained. Prior                         Anticoagulants: The patient has taken no anticoagulant                         or antiplatelet agents. ASA Grade Assessment: II - A                         patient with mild systemic disease. After reviewing                         the risks and benefits, the patient was deemed in                         satisfactory condition to undergo the procedure.                        After obtaining informed consent, the endoscope was                         passed under direct vision. Throughout the procedure,  the patient's blood pressure, pulse, and oxygen                         saturations were monitored continuously. The Endoscope                         was introduced through the mouth, and advanced to the                          second part of duodenum. The upper GI endoscopy was                         accomplished without difficulty. The patient tolerated                         the procedure well. Findings:      A small hiatal hernia was present.      The stomach was normal.      The examined duodenum was normal. Biopsies were taken with a cold       forceps for histology. Impression:            - Small hiatal hernia.                        - Normal stomach.                        - Normal examined duodenum. Biopsied. Recommendation:        - Return patient to hospital ward for ongoing care.                        - Resume previous diet.                        - Continue present medications.                        - Await pathology results.                        - Perform a colonoscopy today. Procedure Code(s):     --- Professional ---                        [phone], Esophagogastroduodenoscopy, flexible,                         transoral; with biopsy, single or multiple Diagnosis Code(s):     --- Professional ---                        R19.7, Diarrhea, unspecified CPT copyright 2022 American Medical Association. All rights reserved. The codes documented in this report are preliminary and upon coder review may  be revised to meet current compliance requirements. Rogelia Copping MD, MD 03/24/2024 12:47:53 PM This report has been signed electronically. Number of Addenda: 0 Note Initiated On: 03/24/2024 11:49 AM Estimated Blood Loss:  Estimated blood loss: none.      [facility]

## 2024-03-24 NOTE — Progress Notes (Signed)
 During bed side report with Lisa [name], pt educated multiple times on importance of drinking all of her prep. Noted to have 2.5 bottles of prep/gatorade left on the table. Pt educated that her BM needs to be clear. Currently she is having brown liquid still per night shift RN. Pt states she was not informed that she had poop in her intestines and it needed to be flushed out. Night shift RN Lisa states patient has been educated multiple times overnight. Pt also educated that if the prep was not complete properly and fully that the MD would not be able to get a good view of her colon and that the test and prep may need to be repeated at a later date. Pt states I'll work on it. Pt requested her lights to be turned off prior to this RN leaving the room.

## 2024-03-24 NOTE — TOC CM/SW Note (Signed)
 Patient is not able to walk the distance required to go the bathroom, or he/she is unable to safely negotiate stairs required to access the bathroom.  A 3in1 BSC will alleviate this problem

## 2024-03-24 NOTE — Evaluation (Signed)
 Physical Therapy Evaluation Patient Details Name: Lisa [name] MRN: [id number] DOB: 06-May-1946 Today's Date: 03/24/2024  History of Present Illness  Lisa [name] is a 78 y.o. female with medical history significant for Prior DVT, not on AC, DM, HTN, HFpEF, chronic functional diarrhea  (hospitalized 03/2023 for diarrhea with electrolyte abnormalities)  Clinical Impression  Pt is pleasant 78 y.o. female admitted for hypokalemia and hypoglycemia 2/2 diarrhea. Prior to hospitalization, pt reports IND with amb and ADLs using SPC. Pt demonstrates STS and ambulation with CGA-SBA for safety. Pt amb 57ft using SPC with noted increase in RR after amb. Unable to obtain accurate reading of SpO2 using pulse ox, however with assessment of symptoms, pt had no complaints. Pt demonstrates deficits in strength. Would benefit from skilled PT to address above deficits and promote optimal return to PLOF. Will plan on following pt acutely to avoid dec in strength/independence.        If plan is discharge home, recommend the following: A little help with walking and/or transfers;A little help with bathing/dressing/bathroom;Help with stairs or ramp for entrance   Can travel by private vehicle        Equipment Recommendations None recommended by PT (No DME needs)  Recommendations for Other Services       Functional Status Assessment Patient has had a recent decline in their functional status and demonstrates the ability to make significant improvements in function in a reasonable and predictable amount of time.     Precautions / Restrictions Precautions Precautions: Fall Recall of Precautions/Restrictions: Intact Restrictions Weight Bearing Restrictions Per Provider Order: No      Mobility  Bed Mobility               General bed mobility comments: NT. Pt received in recliner pre/post session    Transfers Overall transfer level: Needs assistance Equipment used: Straight  cane Transfers: Sit to/from Stand Sit to Stand: Supervision           General transfer comment: use of BUE to push from chair with appropriate forward weight shift for successful stand.    Ambulation/Gait Ambulation/Gait assistance: Supervision Gait Distance (Feet): 60 Feet Assistive device: Straight cane Gait Pattern/deviations: WFL(Within Functional Limits) Gait velocity: decreased     General Gait Details: No LOB with amb. Use of SPC in RUE.  Stairs            Wheelchair Mobility     Tilt Bed    Modified Rankin (Stroke Patients Only)       Balance Overall balance assessment: Needs assistance Sitting-balance support: No upper extremity supported, Feet supported Sitting balance-Leahy Scale: Good Sitting balance - Comments: able to maintain seated balance in recliner. demosntrated good trunk control.   Standing balance support: Single extremity supported, During functional activity, Reliant on assistive device for balance Standing balance-Leahy Scale: Good Standing balance comment: CGA-SBA for static stand. Pt able to don briefs with min A to adjust size.                             Pertinent Vitals/Pain Pain Assessment Pain Assessment: No/denies pain    Home Living Family/patient expects to be discharged to:: Private residence Living Arrangements: Alone Available Help at Discharge: Family;Available PRN/intermittently Type of Home: House Home Access: Stairs to enter;Ramped entrance (Pt reports she only uses ramped entrance) Entrance Stairs-Rails: Right;Left;Can reach both Entrance Stairs-Number of Steps: 2   Home Layout: One level Home Equipment: [employer] (2  wheels);Cane - single point;Shower seat - built in      Prior Function Prior Level of Function : Independent/Modified Independent             Mobility Comments: Reports use of SPC ADLs Comments: IND with ADLs, reports minimal driving, will be selling her car, blind in R  eye     Extremity/Trunk Assessment   Upper Extremity Assessment Upper Extremity Assessment: Overall WFL for tasks assessed (grossly 3/5 with observation performing functional tasks)    Lower Extremity Assessment Lower Extremity Assessment: Overall WFL for tasks assessed;LLE deficits/detail (grossly 3/5 with observation performing functional tasks) LLE Deficits / Details: transmetatarsal amputation    Cervical / Trunk Assessment Cervical / Trunk Assessment: Normal  Communication   Communication Communication: No apparent difficulties    Cognition Arousal: Alert Behavior During Therapy: WFL for tasks assessed/performed   PT - Cognitive impairments: No apparent impairments                       PT - Cognition Comments: Pleasant and agreeable to PT Following commands: Intact       Cueing Cueing Techniques: Verbal cues     General Comments General comments (skin integrity, edema, etc.): Pt on RA. Noted increase in RR after amb however unable to obtain reading of SpO2 with pulse ox. No c/o dizziness upon standing/with positional changes    Exercises     Assessment/Plan    PT Assessment Patient needs continued PT services  PT Problem List Decreased strength;Decreased activity tolerance;Decreased mobility       PT Treatment Interventions Gait training;Functional mobility training;Therapeutic activities;Therapeutic exercise;Balance training;Neuromuscular re-education;Patient/family education    PT Goals (Current goals can be found in the Care Plan section)  Acute Rehab PT Goals Patient Stated Goal: to go home PT Goal Formulation: With patient Time For Goal Achievement: 04/07/24 Potential to Achieve Goals: Good    Frequency Min 1X/week     Co-evaluation               AM-PAC PT 6 Clicks Mobility  Outcome Measure Help needed turning from your back to your side while in a flat bed without using bedrails?: None Help needed moving from lying on your  back to sitting on the side of a flat bed without using bedrails?: A Little Help needed moving to and from a bed to a chair (including a wheelchair)?: A Little Help needed standing up from a chair using your arms (e.g., wheelchair or bedside chair)?: A Little Help needed to walk in hospital room?: A Little Help needed climbing 3-5 steps with a railing? : A Little 6 Click Score: 19    End of Session   Activity Tolerance: Patient tolerated treatment well Patient left: in chair;with call bell/phone within reach;with chair alarm set Nurse Communication: Mobility status PT Visit Diagnosis: Muscle weakness (generalized) (M62.81)    Time: 9142-9083 PT Time Calculation (min) (ACUTE ONLY): 19 min   Charges:                 Anavictoria Wilk, SPT   Aretha Levi 03/24/2024, 9:36 AM

## 2024-03-24 NOTE — Op Note (Signed)
 [facility] Gastroenterology Patient Name: Lisa [name] Procedure Date: 03/24/2024 11:48 AM MRN: [id number] Account #: [id number] Date of Birth: Apr 25, 1946 Admit Type: Inpatient Age: 78 Room: [facility] ENDO ROOM 4 Gender: Female Note Status: Finalized Instrument Name: Colon Scope [id number] Procedure:             Colonoscopy Indications:           Chronic diarrhea Providers:             Rogelia Copping MD, MD Referring MD:          No Local Md, MD (Referring MD) Medicines:             Propofol per Anesthesia Complications:         No immediate complications. Procedure:             Pre-Anesthesia Assessment:                        - Prior to the procedure, a History and Physical was                         performed, and patient medications and allergies were                         reviewed. The patient's tolerance of previous                         anesthesia was also reviewed. The risks and benefits                         of the procedure and the sedation options and risks                         were discussed with the patient. All questions were                         answered, and informed consent was obtained. Prior                         Anticoagulants: The patient has taken no anticoagulant                         or antiplatelet agents. ASA Grade Assessment: II - A                         patient with mild systemic disease. After reviewing                         the risks and benefits, the patient was deemed in                         satisfactory condition to undergo the procedure.                        After obtaining informed consent, the colonoscope was                         passed under direct vision. Throughout the procedure,  the patient's blood pressure, pulse, and oxygen                         saturations were monitored continuously. The was                         introduced through the anus with the intention of                          advancing to the cecum. The scope was advanced to the                         descending colon before the procedure was aborted.                         Medications were given. The colonoscopy was performed                         without difficulty. The patient tolerated the                         procedure well. The quality of the bowel preparation                         was poor. Findings:      A large amount of stool was found in the entire colon, precluding       visualization. Biopsies were taken with a cold forceps for histology. Impression:            - Preparation of the colon was poor.                        - Stool in the entire examined colon. Biopsied. Recommendation:        - Return patient to hospital ward for ongoing care.                        - Resume previous diet.                        - Continue present medications.                        - Await pathology results. Procedure Code(s):     --- Professional ---                        [id number], 52, Colonoscopy, flexible; with biopsy, single                         or multiple Diagnosis Code(s):     --- Professional ---                        K52.9, Noninfective gastroenteritis and colitis,                         unspecified CPT copyright 2022 American Medical Association. All rights reserved. The codes documented in this report are preliminary and upon coder review may  be revised to meet current compliance requirements. Rogelia Copping MD, MD 03/24/2024 1:07:42 PM  This report has been signed electronically. Number of Addenda: 0 Note Initiated On: 03/24/2024 11:48 AM Scope Withdrawal Time: 0 hours 0 minutes 20 seconds  Total Procedure Duration: 0 hours 6 minutes 14 seconds  Estimated Blood Loss:  Estimated blood loss: none.      [facility]

## 2024-03-24 NOTE — Anesthesia Preprocedure Evaluation (Signed)
 Anesthesia Evaluation  Patient identified by MRN, date of birth, ID band Patient awake    Reviewed: Allergy & Precautions, NPO status , Patient's Chart, lab work & pertinent test results  Airway Mallampati: II  TM Distance: >3 FB Neck ROM: full    Dental  (+) Partial Upper   Pulmonary neg pulmonary ROS, shortness of breath and with exertion, COPD, Current Smoker and Patient abstained from smoking.   Pulmonary exam normal  + decreased breath sounds      Cardiovascular Exercise Tolerance: Good hypertension, Pt. on medications + Peripheral Vascular Disease  negative cardio ROS Normal cardiovascular exam Rhythm:Regular Rate:Normal     Neuro/Psych    Depression    negative neurological ROS  negative psych ROS   GI/Hepatic negative GI ROS, Neg liver ROS,,,  Endo/Other  negative endocrine ROSdiabetes, Type 2    Renal/GU negative Renal ROS  negative genitourinary   Musculoskeletal negative musculoskeletal ROS (+)    Abdominal Normal abdominal exam  (+)   Peds negative pediatric ROS (+)  Hematology negative hematology ROS (+) Blood dyscrasia, anemia   Anesthesia Other Findings Past Medical History: No date: Blindness of right eye No date: CHF (congestive heart failure) (HCC) No date: Depression No date: Diabetes mellitus without complication (HCC) 2011: DVT of leg (deep venous thrombosis) (HCC) No date: Glaucoma No date: Grade II diastolic dysfunction No date: Hyperlipidemia No date: Hypertension No date: Vascular disease  Past Surgical History: 1992: ABDOMINAL HYSTERECTOMY     Comment:  Total 02/25/2023: CATARACT EXTRACTION W/PHACO; Left     Comment:  Procedure: CATARACT EXTRACTION PHACO AND INTRAOCULAR               LENS PLACEMENT (IOC) LEFT DIABETIC  malyugin;  Surgeon:               Mittie Gaskin, MD;  Location: [facility];              Service: Ophthalmology;  Laterality: Left;   10.78 0:49.1 No date: EYE SURGERY 09/03/2023: LOWER EXTREMITY ANGIOGRAPHY; Left     Comment:  Procedure: Lower Extremity Angiography;  Surgeon: Marea Selinda RAMAN, MD;  Location: [facility];  Service:               Cardiovascular;  Laterality: Left; 11/05/2023: LOWER EXTREMITY ANGIOGRAPHY; Left     Comment:  Procedure: Lower Extremity Angiography;  Surgeon: Marea Selinda RAMAN, MD;  Location: [facility] CV [facility];  Service:               Cardiovascular;  Laterality: Left; 09/03/2023: LOWER EXTREMITY INTERVENTION; Left     Comment:  Procedure: LOWER EXTREMITY INTERVENTION;  Surgeon: Marea Selinda RAMAN, MD;  Location: [facility];  Service:               Cardiovascular;  Laterality: Left; 11/05/2023: LOWER EXTREMITY INTERVENTION; Left     Comment:  Procedure: LOWER EXTREMITY INTERVENTION;  Surgeon: Marea Selinda RAMAN, MD;  Location: [facility] CV [facility];  Service:               Cardiovascular;  Laterality: Left; 2011: TOE AMPUTATION; Left     Comment:  All of her toes on the left foot No date: VASCULAR SURGERY  BMI    Body Mass Index: 21.83 kg/m      Reproductive/Obstetrics negative OB ROS                              Anesthesia Physical Anesthesia Plan  ASA: 3  Anesthesia Plan: General   Post-op Pain Management:    Induction: Intravenous  PONV Risk Score and Plan: Propofol infusion and TIVA  Airway Management Planned: Natural Airway and Nasal Cannula  Additional Equipment:   Intra-op Plan:   Post-operative Plan:   Informed Consent: I have reviewed the patients History and Physical, chart, labs and discussed the procedure including the risks, benefits and alternatives for the proposed anesthesia with the patient or authorized representative who has indicated his/her understanding and acceptance.     Dental Advisory Given  Plan Discussed with: CRNA  Anesthesia Plan Comments:          Anesthesia Quick Evaluation

## 2024-03-25 DIAGNOSIS — R197 Diarrhea, unspecified: Secondary | ICD-10-CM | POA: Diagnosis not present

## 2024-03-25 LAB — BASIC METABOLIC PANEL WITH GFR
Anion gap: 11 (ref 5–15)
BUN: 8 mg/dL (ref 8–23)
CO2: 26 mmol/L (ref 22–32)
Calcium: 7.3 mg/dL — ABNORMAL LOW (ref 8.9–10.3)
Chloride: 105 mmol/L (ref 98–111)
Creatinine, Ser: 0.53 mg/dL (ref 0.44–1.00)
GFR, Estimated: >60 mL/min (ref >60)
Glucose, Bld: 136 mg/dL — ABNORMAL HIGH (ref 70–99)
Potassium: 3.2 mmol/L — ABNORMAL LOW (ref 3.5–5.1)
Sodium: 142 mmol/L (ref 135–145)

## 2024-03-25 LAB — CBC
HCT: 30.1 % — ABNORMAL LOW (ref 36.0–46.0)
Hemoglobin: 9.9 g/dL — ABNORMAL LOW (ref 12.0–15.0)
MCH: 27 pg (ref 26.0–34.0)
MCHC: 32.9 g/dL (ref 30.0–36.0)
MCV: 82.2 fL (ref 80.0–100.0)
Platelets: 246 K/uL (ref 150–400)
RBC: 3.66 MIL/uL — ABNORMAL LOW (ref 3.87–5.11)
RDW: 18.3 % — ABNORMAL HIGH (ref 11.5–15.5)
WBC: 10.8 K/uL — ABNORMAL HIGH (ref 4.0–10.5)
nRBC: 0 % (ref 0.0–0.2)

## 2024-03-25 LAB — SURGICAL PATHOLOGY

## 2024-03-25 LAB — MAGNESIUM: Magnesium: 1.6 mg/dL — ABNORMAL LOW (ref 1.7–2.4)

## 2024-03-25 MED ORDER — FUROSEMIDE 10 MG/ML IJ SOLN
40.0000 mg | Freq: Once | INTRAMUSCULAR | Status: AC
  Administered 2024-03-25: 40 mg via INTRAVENOUS
  Filled 2024-03-25: qty 4

## 2024-03-25 MED ORDER — LABETALOL HCL 5 MG/ML IV SOLN
20.0000 mg | INTRAVENOUS | Status: DC | PRN
  Administered 2024-03-26 – 2024-03-28 (×3): 20 mg via INTRAVENOUS
  Filled 2024-03-25 (×3): qty 4

## 2024-03-25 MED ORDER — POTASSIUM CHLORIDE CRYS ER 20 MEQ PO TBCR
40.0000 meq | EXTENDED_RELEASE_TABLET | Freq: Two times a day (BID) | ORAL | Status: AC
  Administered 2024-03-25 (×2): 40 meq via ORAL
  Filled 2024-03-25 (×2): qty 2

## 2024-03-25 MED ORDER — MAGNESIUM SULFATE 4 GM/100ML IV SOLN
4.0000 g | Freq: Once | INTRAVENOUS | Status: AC
  Administered 2024-03-25: 4 g via INTRAVENOUS
  Filled 2024-03-25: qty 100

## 2024-03-25 NOTE — Care Management Important Message (Signed)
 Important Message  Patient Details  Name: Lisa [name] MRN: [id number] Date of Birth: [birth year]   Important Message Given:  Yes - Medicare IM     [name] 03/25/2024, 2:51 PM

## 2024-03-25 NOTE — Anesthesia Postprocedure Evaluation (Signed)
 Anesthesia Post Note  Patient: Lisa [name]  Procedure(s) Performed: EGD (ESOPHAGOGASTRODUODENOSCOPY) COLONOSCOPY  Patient location during evaluation: PACU Anesthesia Type: General Level of consciousness: awake Pain management: pain level controlled Vital Signs Assessment: post-procedure vital signs reviewed and stable Respiratory status: spontaneous breathing Cardiovascular status: stable Anesthetic complications: no   No notable events documented.   Last Vitals:  Vitals:   03/24/24 2353 03/25/24 0511  BP: 137/68 130/77  Pulse: 79 66  Resp: 18 18  Temp: 37.8 C 36.9 C  SpO2: 100% 100%    Last Pain:  Vitals:   03/25/24 0511  TempSrc: Oral  PainSc:                  VAN STAVEREN,Ashleymarie Granderson

## 2024-03-25 NOTE — Progress Notes (Signed)
 PROGRESS NOTE   HPI was taken from Dr. Cleatus: Lisa [name] is a 78 y.o. female with medical history significant for Prior DVT, not on AC, DM, HTN, HFpEF, chronic functional diarrhea  (hospitalized 03/2023 for diarrhea with electrolyte abnormalities), being hospitalized with a 3-day history of watery diarrhea associated with electrolyte disturbance including severe hypokalemia less than 2. Diarrhea has been watery, yellowish in color nonbloody and less frequent as every 40 minutes.  She has no nausea or vomiting.  Does have occasional crampy abdominal pain.  Denies fever or chills.  Denies affected contacts. At baseline, patient has 2 loose bowel movements daily, brown in color. In the ED, afebrile, heart rate 55 and BP 123/99 Labs significant for potassium<2, magnesium  1.4Spoke with SNP 500 index I think is something that is a a lot more extension between the problem affecting. Lipase and LFTs WNL WBC normal at 8.4 Hemoglobin 11.4, (13.1 in September 2025) Urinalysis pending EKG showed sinus bradycardia with T wave flattening No imaging done Patient treated with an LR bolus and started on IV and oral potassium repletion Admission requested     Lisa [name]  FMW:[id number] DOB: 1946-04-13 DOA: 03/22/2024 PCP: Glenard Mire, MD   Assessment & Plan:   Principal Problem:   Acute hypokalemia and hypomagnesemia secondary to diarrhea Active Problems:   Hypomagnesemia   Chronic diarrhea, with acute worsening   Anemia   Sinus bradycardia   Chronic heart failure with preserved ejection fraction (HFpEF) (HCC)   Type 2 diabetes mellitus with peripheral neuropathy (HCC)   Hypertension   History of DVT (deep vein thrombosis)   Urinary incontinence   Diarrhea  Assessment and Plan: Hypokalemia: secondary to diarrhea. Potassium ordered  Hypomagnesemia: secondary to diarrhea. MgSO4 ordered   Chronic diarrhea: w/ acute worsening. GI PCR panel & c. diff are both neg. S/p  EGD which showed small hiatal hernia, normal stomach & normal examined duodenum. S/p colonoscopy showed a large amount of stool in the entire colon precluding visualization, biopsies taken, colon prep was poor. Path is pending. If biopsies are non-diagnostic, pt will need another colonoscopy with a better prep as per GI. Patient did not not follow through with colonoscopy nor fecal calprotectin study 06/2022.    Sinus bradycardia: continue on tele. Holding home dose of metoprolol     Normocytic anemia: H&H are labile. Will transfuse if Hb < 7.0    Chronic diastolic CHF: holding metoprolol  secondary to bradycardia. Continue on home dose of losartan , farxiga . IV lasix  x 1 ordered for dyspnea. Monitor I/Os   Urinary incontinence: continue w/ supportive care   HTN: continue on losartan . Holding metoprolol  secondary to bradycardia   DM2: well controlled, HbA1c 6.4 SSI was d/c      DVT prophylaxis: lovenox  Code Status: full  Family Communication: Disposition Plan: d/c back home w/ HH   Level of care: Progressive  Status is: Inpatient Remains inpatient appropriate because: severity of illness, c/o diarrhea & shortness of breath       Consultants:  GI   Procedures:   Antimicrobials:    Subjective: Pt c/o shortness of breath & diarrhea  Objective: Vitals:   03/24/24 2314 03/24/24 2353 03/25/24 0511 03/25/24 0845  BP: (!) 155/73 137/68 130/77 (!) 161/78  Pulse: 89 79 66 66  Resp:  18 18 15   Temp:  100 F (37.8 C) 98.5 F (36.9 C) 98.1 F (36.7 C)  TempSrc:   Oral   SpO2:  100% 100% 100%  Weight:  Height:        Intake/Output Summary (Last 24 hours) at 03/25/2024 0934 Last data filed at 03/25/2024 0600 Gross per 24 hour  Intake 1516.87 ml  Output 0 ml  Net 1516.87 ml   Filed Weights   03/22/24 1833 03/22/24 2303  Weight: 52.2 kg 57.7 kg    Examination:  General exam:appears frustrated Respiratory system: diminished breath sounds b/l  Cardiovascular  system: S1 & S2+. No rubs or clicks Gastrointestinal system: Abd is soft, NT, ND & hyperactive bowel sounds Central nervous system: alert & awake. Moves all extremities Psychiatry: judgement and insight appears at baseline. Flat mood and affect    Data Reviewed: I have personally reviewed following labs and imaging studies  CBC: Recent Labs  Lab 03/22/24 1838 03/23/24 0157 03/23/24 0813 03/24/24 0135 03/25/24 0439  WBC 8.4 6.9 6.8 5.9 10.8*  HGB 11.4* 10.1* 10.7* 10.0* 9.9*  HCT 35.7* 31.0* 32.3* 30.2* 30.1*  MCV 84.2 82.4 82.0 82.1 82.2  PLT 297 272 310 272 246   Basic Metabolic Panel: Recent Labs  Lab 03/22/24 1838 03/23/24 0157 03/23/24 0813 03/23/24 1439 03/23/24 1526 03/23/24 1942 03/24/24 0135 03/24/24 1054 03/25/24 0439  NA 142 142 145 143  --  141 140  --  142  K <2.0* 2.0* 2.5* 2.4* 2.4* 2.2* 2.7* 3.3* 3.2*  CL 97* 100 102 104  --  103 102  --  105  CO2 30 29 27 28   --  26 27  --  26  GLUCOSE 107* 153* 106* 152*  --  164* 115*  --  136*  BUN 15 14 11 10   --  9 8  --  8  CREATININE 0.62 0.56 0.52 0.67  --  0.72 0.48  --  0.53  CALCIUM  7.7* 7.1* 7.4* 7.4*  --  7.3* 7.0*  --  7.3*  MG 1.4* 1.6*  --   --  1.9  --   --   --  1.6*  PHOS  --  2.7  --   --   --   --   --   --   --    GFR: Estimated Creatinine Clearance: 50 mL/min (by C-G formula based on SCr of 0.53 mg/dL). Liver Function Tests: Recent Labs  Lab 03/22/24 1838  AST 23  ALT 19  ALKPHOS 78  BILITOT 0.9  PROT 6.5  ALBUMIN 2.9*   Recent Labs  Lab 03/22/24 1838  LIPASE 26   No results for input(s): AMMONIA in the last 168 hours. Coagulation Profile: No results for input(s): INR, PROTIME in the last 168 hours. Cardiac Enzymes: No results for input(s): CKTOTAL, CKMB, CKMBINDEX, TROPONINI in the last 168 hours. BNP (last 3 results) No results for input(s): PROBNP in the last 8760 hours. HbA1C: No results for input(s): HGBA1C in the last 72 hours. CBG: No results for  input(s): GLUCAP in the last 168 hours. Lipid Profile: No results for input(s): CHOL, HDL, LDLCALC, TRIG, CHOLHDL, LDLDIRECT in the last 72 hours. Thyroid  Function Tests: Recent Labs    03/23/24 1439  TSH 3.540  T4TOTAL 5.9   Anemia Panel: No results for input(s): VITAMINB12, FOLATE, FERRITIN, TIBC, IRON, RETICCTPCT in the last 72 hours. Sepsis Labs: No results for input(s): PROCALCITON, LATICACIDVEN in the last 168 hours.  Recent Results (from the past 240 hours)  Gastrointestinal Panel by PCR , Stool     Status: None   Collection Time: 03/22/24 11:50 PM   Specimen: Stool  Result Value Ref Range  Status   Campylobacter species NOT DETECTED NOT DETECTED Final   Plesimonas shigelloides NOT DETECTED NOT DETECTED Final   Salmonella species NOT DETECTED NOT DETECTED Final   Yersinia enterocolitica NOT DETECTED NOT DETECTED Final   Vibrio species NOT DETECTED NOT DETECTED Final   Vibrio cholerae NOT DETECTED NOT DETECTED Final   Enteroaggregative E coli (EAEC) NOT DETECTED NOT DETECTED Final   Enteropathogenic E coli (EPEC) NOT DETECTED NOT DETECTED Final   Enterotoxigenic E coli (ETEC) NOT DETECTED NOT DETECTED Final   Shiga like toxin producing E coli (STEC) NOT DETECTED NOT DETECTED Final   Shigella/Enteroinvasive E coli (EIEC) NOT DETECTED NOT DETECTED Final   Cryptosporidium NOT DETECTED NOT DETECTED Final   Cyclospora cayetanensis NOT DETECTED NOT DETECTED Final   Entamoeba histolytica NOT DETECTED NOT DETECTED Final   Giardia lamblia NOT DETECTED NOT DETECTED Final   Adenovirus F40/41 NOT DETECTED NOT DETECTED Final   Astrovirus NOT DETECTED NOT DETECTED Final   Norovirus GI/GII NOT DETECTED NOT DETECTED Final   Rotavirus A NOT DETECTED NOT DETECTED Final   Sapovirus (I, II, IV, and V) NOT DETECTED NOT DETECTED Final    Comment: Performed at [facility], [address] [place], [place] [address]  C Difficile Quick Screen w PCR reflex      Status: None   Collection Time: 03/22/24 11:50 PM   Specimen: STOOL  Result Value Ref Range Status   C Diff antigen NEGATIVE NEGATIVE Final   C Diff toxin NEGATIVE NEGATIVE Final   C Diff interpretation No C. difficile detected.  Final    Comment: Performed at [facility], [address] [place], [place] [address]         Radiology Studies: DG Chest [place] 1 View Result Date: 03/23/2024 EXAM: 1 VIEW(S) XRAY OF THE CHEST 03/23/2024 03:40:00 PM COMPARISON: Chest x-ray 04/07/2023. CLINICAL HISTORY: Dyspnea. FINDINGS: LUNGS AND PLEURA: There is central pulmonary vascular congestion. No focal pulmonary opacity. No pulmonary edema. No pleural effusion. No pneumothorax. HEART AND MEDIASTINUM: The heart is enlarged. The aorta is ectatic with atherosclerotic calcification. BONES AND SOFT TISSUES: No acute osseous abnormality. IMPRESSION: 1. Enlarged heart. 2. Central pulmonary vascular congestion. Electronically signed by: Greig Pique MD 03/23/2024 07:05 PM EDT RP Workstation: [id number]        Scheduled Meds:  atropine   1 drop Left Eye TID   brimonidine   1 drop Both Eyes BID   dapagliflozin  propanediol  10 mg Oral Daily   dorzolamide -timolol   1 drop Both Eyes BID   losartan   100 mg Oral Daily   potassium chloride   40 mEq Oral BID   prednisoLONE  acetate  1 drop Left Eye QID   rosuvastatin   10 mg Oral Daily   Continuous Infusions:  magnesium  sulfate bolus IVPB       LOS: 2 days      Anthony CHRISTELLA Pouch, MD [facility] Pager [phone]  If 7PM-7AM, please contact night-coverage [email or web address] 03/25/2024, 9:34 AM

## 2024-03-25 NOTE — Progress Notes (Signed)
 BP 175/78, pt refused PRN blood pressure meds.

## 2024-03-25 NOTE — Plan of Care (Signed)

## 2024-03-25 NOTE — Progress Notes (Signed)
 Mobility Specialist - Progress Note  Pre-mobility: HR-86, , SpO2- 92%  During mobility: HR-88, SpO2-85%  Post-mobility: HR-84,, SPO2-95%   03/25/24 1100  Mobility  Activity Ambulated with assistance;Stood at bedside;Dangled on edge of bed  Level of Assistance Contact guard assist, steadying assist  Assistive Device Cane  Distance Ambulated (ft) 165 ft  Range of Motion/Exercises All extremities  Activity Response Tolerated well (Had a BM)  Mobility visit 1 Mobility  Mobility Specialist Start Time (ACUTE ONLY) [id number]  Mobility Specialist Stop Time (ACUTE ONLY) 1047  Mobility Specialist Time Calculation (min) (ACUTE ONLY) 85 min   Pt was sitting in chair position upon entry and on O2 @ 3 L. Pt agreed to mobility. Pt is able to get to the EOB independently with bed features. Pt is able to STS independently with the use of a cane. Pt ambulated well with a minor gait deviation. Pt had a BM while ambulating. After activity Pt had another BM. Pt was able to get back in bed after activity with needs in reach.  Clem Rodes Mobility Specialist 03/25/24, 11:49 AM

## 2024-03-25 NOTE — Progress Notes (Signed)
 BP 193/77, hydralazine  PRN not due yet. Mansy, MD notified Order for labetalol  placed. BP on recheck 180/74, HR 67. Pr asymptomatic, stating her BP has been running high lately. She refused antihypertensive meds, labetalol  and hydralazine . She would like to recheck BP in about an hour, and see if BP still high. Mansy, MD notified.

## 2024-03-26 DIAGNOSIS — R197 Diarrhea, unspecified: Secondary | ICD-10-CM | POA: Diagnosis not present

## 2024-03-26 DIAGNOSIS — F32A Depression, unspecified: Secondary | ICD-10-CM

## 2024-03-26 DIAGNOSIS — E1140 Type 2 diabetes mellitus with diabetic neuropathy, unspecified: Secondary | ICD-10-CM

## 2024-03-26 DIAGNOSIS — Z86718 Personal history of other venous thrombosis and embolism: Secondary | ICD-10-CM

## 2024-03-26 DIAGNOSIS — N39498 Other specified urinary incontinence: Secondary | ICD-10-CM

## 2024-03-26 DIAGNOSIS — R001 Bradycardia, unspecified: Secondary | ICD-10-CM

## 2024-03-26 DIAGNOSIS — E876 Hypokalemia: Secondary | ICD-10-CM | POA: Diagnosis not present

## 2024-03-26 DIAGNOSIS — D649 Anemia, unspecified: Secondary | ICD-10-CM

## 2024-03-26 LAB — CBC
HCT: 29.6 % — ABNORMAL LOW (ref 36.0–46.0)
Hemoglobin: 9.6 g/dL — ABNORMAL LOW (ref 12.0–15.0)
MCH: 27.1 pg (ref 26.0–34.0)
MCHC: 32.4 g/dL (ref 30.0–36.0)
MCV: 83.6 fL (ref 80.0–100.0)
Platelets: 237 K/uL (ref 150–400)
RBC: 3.54 MIL/uL — ABNORMAL LOW (ref 3.87–5.11)
RDW: 18.6 % — ABNORMAL HIGH (ref 11.5–15.5)
WBC: 5.6 K/uL (ref 4.0–10.5)
nRBC: 0 % (ref 0.0–0.2)

## 2024-03-26 LAB — BASIC METABOLIC PANEL WITH GFR
Anion gap: 9 (ref 5–15)
BUN: 10 mg/dL (ref 8–23)
CO2: 27 mmol/L (ref 22–32)
Calcium: 7.9 mg/dL — ABNORMAL LOW (ref 8.9–10.3)
Chloride: 105 mmol/L (ref 98–111)
Creatinine, Ser: 0.65 mg/dL (ref 0.44–1.00)
GFR, Estimated: >60 mL/min (ref >60)
Glucose, Bld: 123 mg/dL — ABNORMAL HIGH (ref 70–99)
Potassium: 3.9 mmol/L (ref 3.5–5.1)
Sodium: 141 mmol/L (ref 135–145)

## 2024-03-26 LAB — GASTRIN: Gastrin: 41 pg/mL (ref 0–115)

## 2024-03-26 LAB — CALPROTECTIN, FECAL: Calprotectin, Fecal: 76 ug/g (ref 0–120)

## 2024-03-26 LAB — MAGNESIUM: Magnesium: 2 mg/dL (ref 1.7–2.4)

## 2024-03-26 MED ORDER — CITALOPRAM HYDROBROMIDE 20 MG PO TABS
10.0000 mg | ORAL_TABLET | Freq: Every day | ORAL | Status: DC
  Administered 2024-03-26 – 2024-03-29 (×4): 10 mg via ORAL
  Filled 2024-03-26 (×5): qty 1

## 2024-03-26 NOTE — Progress Notes (Signed)
 PT Cancellation Note  Patient Details Name: Lisa [name] MRN: [id number] DOB: 08/20/1945   Cancelled Treatment:     PT attempt. Pt was asleep in side lying upon  arrival with untouched lunch tray at bedside. Pt easily awakes however refused session at this time due to fatigue. Extensive education on importance of intake and need for OOB activity to progress strength and independence. BP 181/76. Pt did eventually agree to taking BP meds but needed encouragement from RN staff. Acute PT will continue to follow and return when pt is willing to participate in OOB activity.    Rankin KATHEE Essex 03/26/2024, 2:00 PM

## 2024-03-26 NOTE — Progress Notes (Addendum)
 Progress Note    Lisa [name]  FMW:[id number] DOB: [birth year]  DOA: 03/22/2024 PCP: Sowles, Krichna, MD      Brief Narrative:    Medical records reviewed and are as summarized below:  Lisa [name] is a 78 y.o. female with medical history significant for Prior DVT, not on AC, DM, HTN, HFpEF, chronic functional diarrhea (hospitalized 03/2023 for diarrhea with electrolyte abnormalities), who presented to the ED with diarrhea and abdominal pain that have been going on for several days.  Watery diarrhea had become more frequent.  She had significant electrolyte abnormalities on admission and potassium was less than 2 and magnesium  1.4.       Assessment/Plan:   Principal Problem:   Acute hypokalemia and hypomagnesemia secondary to diarrhea Active Problems:   Hypomagnesemia   Chronic diarrhea, with acute worsening   Anemia   Sinus bradycardia   Chronic heart failure with preserved ejection fraction (HFpEF) (HCC)   Type 2 diabetes mellitus with peripheral neuropathy (HCC)   Hypertension   History of DVT (deep vein thrombosis)   Urinary incontinence   Diarrhea   Body mass index is 21.83 kg/m.   Hypokalemia: Improved   Hypomagnesemia: Improved   Chronic diarrhea: Improving.  Imodium  as needed. S/p EGD and colonoscopy on 03/24/2024. EGD showed small hiatal hernia, normal stomach, normal examined duodenum.  Colonoscopy prep was poor and there was stool in the entire examined colon. Pathology report: Small bowel showed duodenal mucosa with normal villous architecture.  Colonic mucosa was unremarkable and was negative for microscopic colitis, active inflammation and chronic changes.   Hypertension: Continue antihypertensives   Depression: Patient says she is depressed but does not want to harm herself.  Consulted psychiatrist, Dr. Ruther, to assist with management Restart Celexa    Chronic HFpEF: Compensated   Comorbidities include type II  DM, peripheral neuropathy, hyperlipidemia, urinary incontinence, history of DVT in 2011   Diet Order             Diet regular Fluid consistency: Thin  Diet effective now                                  Consultants: Gastroenterologist Psychiatrist  Procedures: EGD and colonoscopy on 03/24/2024    Medications:    atropine   1 drop Left Eye TID   brimonidine   1 drop Both Eyes BID   dapagliflozin  propanediol  10 mg Oral Daily   dorzolamide -timolol   1 drop Both Eyes BID   losartan   100 mg Oral Daily   prednisoLONE  acetate  1 drop Left Eye QID   rosuvastatin   10 mg Oral Daily   Continuous Infusions:   Anti-infectives (From admission, onward)    None              Family Communication/Anticipated D/C date and plan/Code Status   DVT prophylaxis: SCDs Start: 03/22/24 2219     Code Status: Limited: Do not attempt resuscitation (DNR) -DNR-LIMITED -Do Not Intubate/DNI   Family Communication: None Disposition Plan: Plan to discharge home   Status is: Inpatient Remains inpatient appropriate because: Depression awaiting psych evaluation       Subjective:   Interval events noted.  She said diarrhea has improved but she is frustrated that because of chronic diarrhea cannot be found.  She said that she lives alone and prefers to go home tomorrow  Objective:    Vitals:   03/26/24 0500 03/26/24 0600  03/26/24 0752 03/26/24 1229  BP:   (!) 151/70 (!) 193/91  Pulse:   66 71  Resp:   18 20  Temp:   98.9 F (37.2 C) 98.7 F (37.1 C)  TempSrc:    Oral  SpO2: 99% 96% 95% 100%  Weight:      Height:       No data found.   Intake/Output Summary (Last 24 hours) at 03/26/2024 1447 Last data filed at 03/25/2024 2200 Gross per 24 hour  Intake 120 ml  Output 1450 ml  Net -1330 ml   Filed Weights   03/22/24 1833 03/22/24 2303  Weight: 52.2 kg 57.7 kg    Exam:  GEN: NAD SKIN: Warm and dry EYES: No pallor or icterus ENT: MMM CV:  RRR PULM: CTA B ABD: soft, ND, NT, +BS CNS: AAO x 3, non focal EXT: No edema or tenderness        Data Reviewed:   I have personally reviewed following labs and imaging studies:  Labs: Labs show the following:   Basic Metabolic Panel: Recent Labs  Lab 03/22/24 1838 03/23/24 0157 03/23/24 0813 03/23/24 1439 03/23/24 1526 03/23/24 1942 03/24/24 0135 03/24/24 1054 03/25/24 0439 03/26/24 0618  NA 142 142   < > 143  --  141 140  --  142 141  K <2.0* 2.0*   < > 2.4* 2.4* 2.2* 2.7*   < > 3.2* 3.9  CL 97* 100   < > 104  --  103 102  --  105 105  CO2 30 29   < > 28  --  26 27  --  26 27  GLUCOSE 107* 153*   < > 152*  --  164* 115*  --  136* 123*  BUN 15 14   < > 10  --  9 8  --  8 10  CREATININE 0.62 0.56   < > 0.67  --  0.72 0.48  --  0.53 0.65  CALCIUM  7.7* 7.1*   < > 7.4*  --  7.3* 7.0*  --  7.3* 7.9*  MG 1.4* 1.6*  --   --  1.9  --   --   --  1.6* 2.0  PHOS  --  2.7  --   --   --   --   --   --   --   --    < > = values in this interval not displayed.   GFR Estimated Creatinine Clearance: 50 mL/min (by C-G formula based on SCr of 0.65 mg/dL). Liver Function Tests: Recent Labs  Lab 03/22/24 1838  AST 23  ALT 19  ALKPHOS 78  BILITOT 0.9  PROT 6.5  ALBUMIN 2.9*   Recent Labs  Lab 03/22/24 1838  LIPASE 26   No results for input(s): AMMONIA in the last 168 hours. Coagulation profile No results for input(s): INR, PROTIME in the last 168 hours.  CBC: Recent Labs  Lab 03/23/24 0157 03/23/24 0813 03/24/24 0135 03/25/24 0439 03/26/24 0618  WBC 6.9 6.8 5.9 10.8* 5.6  HGB 10.1* 10.7* 10.0* 9.9* 9.6*  HCT 31.0* 32.3* 30.2* 30.1* 29.6*  MCV 82.4 82.0 82.1 82.2 83.6  PLT 272 310 272 246 237   Cardiac Enzymes: No results for input(s): CKTOTAL, CKMB, CKMBINDEX, TROPONINI in the last 168 hours. BNP (last 3 results) No results for input(s): PROBNP in the last 8760 hours. CBG: No results for input(s): GLUCAP in the last 168  hours. D-Dimer: No results for  input(s): DDIMER in the last 72 hours. Hgb A1c: No results for input(s): HGBA1C in the last 72 hours. Lipid Profile: No results for input(s): CHOL, HDL, LDLCALC, TRIG, CHOLHDL, LDLDIRECT in the last 72 hours. Thyroid  function studies: No results for input(s): TSH, T4TOTAL, T3FREE, THYROIDAB in the last 72 hours.  Invalid input(s): FREET3 Anemia work up: No results for input(s): VITAMINB12, FOLATE, FERRITIN, TIBC, IRON, RETICCTPCT in the last 72 hours. Sepsis Labs: Recent Labs  Lab 03/23/24 0813 03/24/24 0135 03/25/24 0439 03/26/24 0618  WBC 6.8 5.9 10.8* 5.6    Microbiology Recent Results (from the past 240 hours)  Gastrointestinal Panel by PCR , Stool     Status: None   Collection Time: 03/22/24 11:50 PM   Specimen: Stool  Result Value Ref Range Status   Campylobacter species NOT DETECTED NOT DETECTED Final   Plesimonas shigelloides NOT DETECTED NOT DETECTED Final   Salmonella species NOT DETECTED NOT DETECTED Final   Yersinia enterocolitica NOT DETECTED NOT DETECTED Final   Vibrio species NOT DETECTED NOT DETECTED Final   Vibrio cholerae NOT DETECTED NOT DETECTED Final   Enteroaggregative E coli (EAEC) NOT DETECTED NOT DETECTED Final   Enteropathogenic E coli (EPEC) NOT DETECTED NOT DETECTED Final   Enterotoxigenic E coli (ETEC) NOT DETECTED NOT DETECTED Final   Shiga like toxin producing E coli (STEC) NOT DETECTED NOT DETECTED Final   Shigella/Enteroinvasive E coli (EIEC) NOT DETECTED NOT DETECTED Final   Cryptosporidium NOT DETECTED NOT DETECTED Final   Cyclospora cayetanensis NOT DETECTED NOT DETECTED Final   Entamoeba histolytica NOT DETECTED NOT DETECTED Final   Giardia lamblia NOT DETECTED NOT DETECTED Final   Adenovirus F40/41 NOT DETECTED NOT DETECTED Final   Astrovirus NOT DETECTED NOT DETECTED Final   Norovirus GI/GII NOT DETECTED NOT DETECTED Final   Rotavirus A NOT DETECTED NOT DETECTED  Final   Sapovirus (I, II, IV, and V) NOT DETECTED NOT DETECTED Final    Comment: Performed at [facility], [address] [place], [place] [address]  C Difficile Quick Screen w PCR reflex     Status: None   Collection Time: 03/22/24 11:50 PM   Specimen: STOOL  Result Value Ref Range Status   C Diff antigen NEGATIVE NEGATIVE Final   C Diff toxin NEGATIVE NEGATIVE Final   C Diff interpretation No C. difficile detected.  Final    Comment: Performed at [facility], [address] [place], [place] [address]  Calprotectin, Fecal     Status: None   Collection Time: 03/23/24  8:25 PM   Specimen: Stool  Result Value Ref Range Status   Calprotectin, Fecal 76 0 - 120 ug/g Final    Comment: (NOTE) Concentration     Interpretation   Follow-Up < 5 - 50 ug/g     Normal           None >50 -120 ug/g     Borderline       Re-evaluate in 4-6 weeks    >120 ug/g     Abnormal         Repeat as clinically                                   indicated Performed At: [facility] [address] [place], [place] [id number] Jennette Shorter MD Ey:[id number]     Procedures and diagnostic studies:  No results found.  LOS: 3 days   Jossalyn Forgione  [facility]   Pager on [email or web address]. If 7PM-7AM, please contact night-coverage at [email or web address]     03/26/2024, 2:47 PM

## 2024-03-26 NOTE — Consult Note (Signed)
 [facility] Psychiatric Consult Initial  Patient Name: .Lisa [name]  MRN: [id number]  DOB: 12/07/1945  Consult Order details:  Orders (From admission, onward)     Start     Ordered   03/26/24 1440  IP CONSULT TO PSYCHIATRY       Ordering Provider: Jens Durand, MD  Provider:  Ruther Millie SAUNDERS, MD  Question Answer Comment  Location [facility]   Reason for Consult? depression      03/26/24 1440             Mode of Visit: In person    Psychiatry Consult Evaluation  Service Date: March 26, 2024 LOS:  LOS: 3 days  Chief Complaint I am depressed  Primary Psychiatric Diagnoses  Depression   Assessment  Lisa [name] is a 78 y.o. female admitted: Medically   [name] is a 78 year old black female who is assessed today on the inpatient unit. Patient states endorses a long history of depression with worsening symptoms since her admission on Tuesday. At her bedside is her caregiver and offers concerns that patient spends too much time alone. According to Ms. Paolini, she feels as though her family and friends do not come to see her or inquire about her. She has no children and her spouse died in May 02, 2007. Explains that she has seen therapy in the past and has no history of psychiatric hospitalization and current medication of citalopram  is managed by her PCP. She is tearful in her interview today and explains that she also misplaced her wallet which also contributes to her stress. According to nursing staff, her wallet was obtained by her brother due to concerns of her misplacing her wallet again. After review of chart, patient has not taken her citalopram  since her admission and could be the cause of her worsening depression. She denies SI, HI, AVH. Reports that she was told by the hospitalist that she will be discharged home tomorrow.   Diagnoses:  Active Hospital problems: Principal Problem:   Acute hypokalemia and hypomagnesemia  secondary to diarrhea Active Problems:   Type 2 diabetes mellitus with peripheral neuropathy (HCC)   Chronic heart failure with preserved ejection fraction (HFpEF) (HCC)   Hypertension   Hypomagnesemia   Chronic diarrhea, with acute worsening   History of DVT (deep vein thrombosis)   Anemia   Sinus bradycardia   Urinary incontinence   Diarrhea    Plan   ## Psychiatric Medication Recommendations:  Restart citalopram   ## Medical Decision Making Capacity: Not specifically addressed in this encounter  ## Further Work-up:   -- most recent EKG on 03/22/2024 had QtC of 433 -- Pertinent labwork reviewed earlier this admission includes: CBC, CMP   ## Disposition:-- There are no psychiatric contraindications to discharge at this time  ## Behavioral / Environmental: -Utilize compassion and acknowledge the patient's experiences while setting clear and realistic expectations for care.    ## Safety and Observation Level:  - Based on my clinical evaluation, I estimate the patient to be at LOW risk of self harm in the current setting. - At this time, we recommend  routine. This decision is based on my review of the chart including patient's history and current presentation, interview of the patient, mental status examination, and consideration of suicide risk including evaluating suicidal ideation, plan, intent, suicidal or self-harm behaviors, risk factors, and protective factors. This judgment is based on our ability to directly address suicide risk, implement suicide prevention strategies, and develop  a safety plan while the patient is in the clinical setting. Please contact our team if there is a concern that risk level has changed.  CSSR Risk Category:C-SSRS RISK CATEGORY: No Risk  Suicide Risk Assessment: Patient has following modifiable risk factors for suicide: social isolation, which we are addressing by encouraging family engagement. Patient has following non-modifiable or  demographic risk factors for suicide: NA Patient has the following protective factors against suicide: Supportive family, Supportive friends, and no history of suicide attempts  Thank you for this consult request. Recommendations have been communicated to the primary team.  We will follow as needed at this time.   Nick Armel B Marua Qin, NP       History of Present Illness  Relevant Aspects of [facility]   Patient Report:   Lisa [name] is a 78 year old black female who is assessed today on the inpatient unit. Patient states endorses a long history of depression with worsening symptoms since her admission on Tuesday. At her bedside is her caregiver and offers concerns that patient spends too much time alone. According to Ms. Brener, she feels as though her family and friends do not come to see her or inquire about her. She has no children and her spouse died in 03-May-2007. Explains that she has seen therapy in the past and has no history of psychiatric hospitalization and current medication of citalopram  is managed by her PCP. She is tearful in her interview today and explains that she also misplaced her wallet which also contributes to her stress. According to nursing staff, her wallet was obtained by her brother due to concerns of her misplacing her wallet again. After review of chart, patient has not taken her citalopram  since her admission and could be the cause of her worsening depression. She denies SI, HI, AVH. Reports that she was told by the hospitalist that she will be discharged home tomorrow.   Psych ROS:  Depression: endorses Anxiety:  endorses Mania (lifetime and current): denies Psychosis: (lifetime and current): denies  Collateral information:  Caregiver and friend at bedside    Psychiatric and Social History  Psychiatric History:  Information collected from patient and caregiver  Prev Dx/Sx: depression Current Psych Provider: PCP Home Meds (current): citalopram  Previous  Med Trials: citalopram  Therapy: hx  Prior Psych Hospitalization: denies  Prior Self Harm: denies  Prior Violence: denies   Family Psych History: denies  Family Hx suicide: denies   Social History:   Educational Hx: HS 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 Hx: denies  Living Situation: alone Spiritual Hx: Christian  Access to weapons/lethal means: denies   Substance History Alcohol : denies   Type of alcohol  denies  Last Drink denies  Number of drinks per day denies  History of alcohol  withdrawal seizures denies  History of DT's denies  Tobacco: denies  Illicit drugs: denies  Prescription drug abuse: denies  Rehab hx: denies   Exam Findings  Physical Exam: I have reviewed and agree with provider exam Vital Signs:  Temp:  [97.8 F (36.6 C)-98.9 F (37.2 C)] 97.8 F (36.6 C) (11/01 1610) Pulse Rate:  [65-75] 75 (11/01 1610) Resp:  [18-20] 18 (11/01 1610) BP: (139-193)/(70-112) 188/84 (11/01 1610) SpO2:  [95 %-100 %] 95 % (11/01 1610) Blood pressure (!) 188/84, pulse 75, temperature 97.8 F (36.6 C), temperature source Oral, resp. rate 18, height 5' 4 (1.626 m), weight 57.7 kg, SpO2 95%. Body mass index is 21.83 kg/m.    Mental Status Exam: General Appearance: Fairly Groomed  Orientation:  Full (Time, Place, and Person)  Memory:  Immediate;   Good Recent;   Good Remote;   Good  Concentration:  Concentration: Good and Attention Span: Good  Recall:  Good  Attention  Good  Eye Contact:  Good  Speech:  Normal Rate  Language:  Good  Volume:  Normal  Mood: depressed  Affect:  Depressed  Thought Process:  Goal Directed  Thought Content:  WDL  Suicidal Thoughts:  No  Homicidal Thoughts:  No  Judgement:  Good  Insight:  Good  Psychomotor Activity:  Normal  Akathisia:  Negative  Fund of Knowledge:  Good      Assets:  Communication Skills Desire for Improvement Financial Resources/Insurance Housing Social Support  Cognition:  WNL  ADL's:  Intact  AIMS (if  indicated):        Other History   These have been pulled in through the EMR, reviewed, and updated if appropriate.  Family History:  The patient's family history includes Diabetes in her brother, mother, sister, and sister; Heart disease in her brother and father; Kidney disease in her brother.  Medical History: Past Medical History:  Diagnosis Date   Blindness of right eye    CHF (congestive heart failure) (HCC)    Depression    Diabetes mellitus without complication (HCC)    DVT of leg (deep venous thrombosis) (HCC) 2011   Glaucoma    Grade II diastolic dysfunction    Hyperlipidemia    Hypertension    Vascular disease     Surgical History: Past Surgical History:  Procedure Laterality Date   ABDOMINAL HYSTERECTOMY  1992   Total   CATARACT EXTRACTION W/PHACO Left 02/25/2023   Procedure: CATARACT EXTRACTION PHACO AND INTRAOCULAR LENS PLACEMENT (IOC) LEFT DIABETIC  malyugin;  Surgeon: Mittie Gaskin, MD;  Location: [facility];  Service: Ophthalmology;  Laterality: Left;  10.78 0:49.1   COLONOSCOPY N/A 03/24/2024   Procedure: COLONOSCOPY;  Surgeon: Jinny Carmine, MD;  Location: [facility] ENDOSCOPY;  Service: Endoscopy;  Laterality: N/A;   ESOPHAGOGASTRODUODENOSCOPY N/A 03/24/2024   Procedure: EGD (ESOPHAGOGASTRODUODENOSCOPY);  Surgeon: Jinny Carmine, MD;  Location: [facility] ENDOSCOPY;  Service: Endoscopy;  Laterality: N/A;   EYE SURGERY     LOWER EXTREMITY ANGIOGRAPHY Left 09/03/2023   Procedure: Lower Extremity Angiography;  Surgeon: Marea Selinda RAMAN, MD;  Location: [facility] INVASIVE CV LAB;  Service: Cardiovascular;  Laterality: Left;   LOWER EXTREMITY ANGIOGRAPHY Left 11/05/2023   Procedure: Lower Extremity Angiography;  Surgeon: Marea Selinda RAMAN, MD;  Location: [facility] INVASIVE CV LAB;  Service: Cardiovascular;  Laterality: Left;   LOWER EXTREMITY INTERVENTION Left 09/03/2023   Procedure: LOWER EXTREMITY INTERVENTION;  Surgeon: Marea Selinda RAMAN, MD;  Location: [facility] INVASIVE CV LAB;  Service:  Cardiovascular;  Laterality: Left;   LOWER EXTREMITY INTERVENTION Left 11/05/2023   Procedure: LOWER EXTREMITY INTERVENTION;  Surgeon: Marea Selinda RAMAN, MD;  Location: [facility] INVASIVE CV LAB;  Service: Cardiovascular;  Laterality: Left;   TOE AMPUTATION Left 2011   All of her toes on the left foot   VASCULAR SURGERY       Medications:   Current Facility-Administered Medications:    acetaminophen  (TYLENOL ) tablet 650 mg, 650 mg, Oral, Q6H PRN **OR** acetaminophen  (TYLENOL ) suppository 650 mg, 650 mg, Rectal, Q6H PRN, Wohl, Darren, MD   artificial tears ophthalmic solution 2 drop, 2 drop, Right Eye, PRN, Jinny Carmine, MD   atropine  1 % ophthalmic solution 1 drop, 1 drop, Left Eye, TID, Jinny Carmine, MD, 1 drop at 03/26/24 1249  brimonidine  (ALPHAGAN ) 0.2 % ophthalmic solution 1 drop, 1 drop, Both Eyes, BID, Wohl, Darren, MD, 1 drop at 03/26/24 1249   citalopram  (CELEXA ) tablet 10 mg, 10 mg, Oral, Daily, Ayiku, Bernard, MD, 10 mg at 03/26/24 1831   dapagliflozin  propanediol (FARXIGA ) tablet 10 mg, 10 mg, Oral, Daily, Jinny, Darren, MD, 10 mg at 03/25/24 9150   dorzolamide -timolol  (COSOPT ) 2-0.5 % ophthalmic solution 1 drop, 1 drop, Both Eyes, BID, Wohl, Darren, MD, 1 drop at 03/26/24 1249   hydrALAZINE  (APRESOLINE ) injection 5 mg, 5 mg, Intravenous, Q4H PRN, Jinny Carmine, MD, 5 mg at 03/25/24 1622   labetalol  (NORMODYNE ) injection 20 mg, 20 mg, Intravenous, Q3H PRN, Mansy, Jan A, MD   loperamide  (IMODIUM ) capsule 2 mg, 2 mg, Oral, PRN, Mansy, Jan A, MD, 2 mg at 03/25/24 1045   losartan  (COZAAR ) tablet 100 mg, 100 mg, Oral, Daily, Jinny, Darren, MD, 100 mg at 03/26/24 1248   prednisoLONE  acetate (PRED FORTE ) 1 % ophthalmic suspension 1 drop, 1 drop, Left Eye, QID, Jinny, Darren, MD, 1 drop at 03/26/24 1248   prochlorperazine (COMPAZINE) injection 5 mg, 5 mg, Intravenous, Q4H PRN, Jinny, Darren, MD   rosuvastatin  (CRESTOR ) tablet 10 mg, 10 mg, Oral, Daily, Jinny, Darren, MD, 10 mg at 03/26/24  1248  Allergies: Allergies  Allergen Reactions   Other Itching    Nucinta - Hallucinations   Tapentadol Hcl Itching    Hallucinations   Actos  [Pioglitazone ]     History of CHF   Penicillins Rash    Daine KATHEE Ober, NP

## 2024-03-27 DIAGNOSIS — E876 Hypokalemia: Secondary | ICD-10-CM | POA: Diagnosis not present

## 2024-03-27 LAB — CBC
HCT: 29 % — ABNORMAL LOW (ref 36.0–46.0)
Hemoglobin: 9.3 g/dL — ABNORMAL LOW (ref 12.0–15.0)
MCH: 26.8 pg (ref 26.0–34.0)
MCHC: 32.1 g/dL (ref 30.0–36.0)
MCV: 83.6 fL (ref 80.0–100.0)
Platelets: 248 K/uL (ref 150–400)
RBC: 3.47 MIL/uL — ABNORMAL LOW (ref 3.87–5.11)
RDW: 18.4 % — ABNORMAL HIGH (ref 11.5–15.5)
WBC: 5.1 K/uL (ref 4.0–10.5)
nRBC: 0 % (ref 0.0–0.2)

## 2024-03-27 LAB — RENAL FUNCTION PANEL
Albumin: 2.4 g/dL — ABNORMAL LOW (ref 3.5–5.0)
Anion gap: 8 (ref 5–15)
BUN: 11 mg/dL (ref 8–23)
CO2: 27 mmol/L (ref 22–32)
Calcium: 8.2 mg/dL — ABNORMAL LOW (ref 8.9–10.3)
Chloride: 107 mmol/L (ref 98–111)
Creatinine, Ser: 0.72 mg/dL (ref 0.44–1.00)
GFR, Estimated: >60 mL/min (ref >60)
Glucose, Bld: 99 mg/dL (ref 70–99)
Phosphorus: 3 mg/dL (ref 2.5–4.6)
Potassium: 3.3 mmol/L — ABNORMAL LOW (ref 3.5–5.1)
Sodium: 142 mmol/L (ref 135–145)

## 2024-03-27 LAB — MAGNESIUM: Magnesium: 1.9 mg/dL (ref 1.7–2.4)

## 2024-03-27 MED ORDER — METOPROLOL SUCCINATE ER 50 MG PO TB24
50.0000 mg | ORAL_TABLET | Freq: Once | ORAL | Status: AC
  Administered 2024-03-27: 50 mg via ORAL
  Filled 2024-03-27: qty 1

## 2024-03-27 MED ORDER — POTASSIUM CHLORIDE CRYS ER 20 MEQ PO TBCR
40.0000 meq | EXTENDED_RELEASE_TABLET | Freq: Once | ORAL | Status: AC
  Administered 2024-03-27: 40 meq via ORAL
  Filled 2024-03-27: qty 2

## 2024-03-27 MED ORDER — AMLODIPINE BESYLATE 10 MG PO TABS
10.0000 mg | ORAL_TABLET | Freq: Every day | ORAL | Status: DC
  Administered 2024-03-27 – 2024-03-29 (×3): 10 mg via ORAL
  Filled 2024-03-27 (×3): qty 1

## 2024-03-27 MED ORDER — METOPROLOL SUCCINATE ER 25 MG PO TB24
25.0000 mg | ORAL_TABLET | Freq: Every day | ORAL | Status: DC
  Administered 2024-03-28 – 2024-03-29 (×2): 25 mg via ORAL
  Filled 2024-03-27 (×2): qty 1

## 2024-03-27 MED ORDER — METOPROLOL SUCCINATE ER 25 MG PO TB24: 25.0000 mg | ORAL_TABLET | Freq: Every day | ORAL | Status: DC

## 2024-03-27 MED ORDER — POTASSIUM CHLORIDE CRYS ER 10 MEQ PO TBCR
10.0000 meq | EXTENDED_RELEASE_TABLET | Freq: Every day | ORAL | Status: DC
  Administered 2024-03-28 – 2024-03-29 (×2): 10 meq via ORAL
  Filled 2024-03-27 (×2): qty 1

## 2024-03-27 MED ORDER — MAGNESIUM SULFATE 2 GM/50ML IV SOLN
2.0000 g | Freq: Once | INTRAVENOUS | Status: AC
  Administered 2024-03-27: 2 g via INTRAVENOUS
  Filled 2024-03-27: qty 50

## 2024-03-27 NOTE — Progress Notes (Signed)
 Patient had 11 beats of VTs.  she was asymptomatic on reassessment. Notified Dr. Lawence and received a verbal order for Potassium 40 meq x one dose and Mg. Lab to be added to previous lab.

## 2024-03-27 NOTE — Progress Notes (Addendum)
 Progress Note    Lisa [name]  FMW:[id number] DOB: 12-08-1945  DOA: 03/22/2024 PCP: Sowles, Krichna, MD      Brief Narrative:    Medical records reviewed and are as summarized below:  Lisa [name] is a 78 y.o. female with medical history significant for Prior DVT, not on AC, DM, HTN, HFpEF, chronic functional diarrhea (hospitalized 03/2023 for diarrhea with electrolyte abnormalities), who presented to the ED with diarrhea and abdominal pain that have been going on for several days.  Watery diarrhea had become more frequent.  She had significant electrolyte abnormalities on admission and potassium was less than 2 and magnesium  1.4.       Assessment/Plan:   Principal Problem:   Acute hypokalemia and hypomagnesemia secondary to diarrhea Active Problems:   Hypomagnesemia   Chronic diarrhea, with acute worsening   Anemia   Sinus bradycardia   Chronic heart failure with preserved ejection fraction (HFpEF) (HCC)   Type 2 diabetes mellitus with peripheral neuropathy (HCC)   Hypertension   History of DVT (deep vein thrombosis)   Urinary incontinence   Diarrhea   Body mass index is 21.83 kg/m.   Hypokalemia: Potassium down from 3.9-3.3.  Replete potassium and monitor levels.  She will likely need low-dose potassium supplement at discharge.   Hypomagnesemia: Improved.  Magnesium  was 1.9 today.  She was given IV magnesium  sulfate to help with potassium correction.   Chronic diarrhea: Improving.  Imodium  as needed. S/p EGD and colonoscopy on 03/24/2024. EGD showed small hiatal hernia, normal stomach, normal examined duodenum.  Colonoscopy prep was poor and there was stool in the entire examined colon. Pathology report: Small bowel showed duodenal mucosa with normal villous architecture.  Colonic mucosa was unremarkable and was negative for microscopic colitis, active inflammation and chronic changes.   Hypertensive urgency: BP still uncontrolled.   Most recent BP 201/92.  Continue losartan .  Fluids, patient was on Toprol -XL prior to admission and this has been resumed.  Patient had been on amlodipine  at some point but it appears she has not had refills since February 2025.  She does not remember why she stopped taking amlodipine  but she says she did not have any problems while she was on it.  Resume amlodipine  for adequate BP control. Monitor BP closely and adjust antihypertensives accordingly.   Depression: Continue Celexa . She was evaluated by the psychiatrist on 03/26/2024.   Chronic HFpEF: Compensated   Comorbidities include type II DM, peripheral neuropathy, hyperlipidemia, urinary incontinence, history of DVT in 2011   Diet Order             Diet regular Fluid consistency: Thin  Diet effective now                                  Consultants: Gastroenterologist Psychiatrist  Procedures: EGD and colonoscopy on 03/24/2024    Medications:    amLODipine   10 mg Oral Daily   atropine   1 drop Left Eye TID   brimonidine   1 drop Both Eyes BID   citalopram   10 mg Oral Daily   dapagliflozin  propanediol  10 mg Oral Daily   dorzolamide -timolol   1 drop Both Eyes BID   losartan   100 mg Oral Daily   [START ON 03/28/2024] metoprolol  succinate  25 mg Oral Daily   [START ON 03/28/2024] potassium chloride   10 mEq Oral Daily   prednisoLONE  acetate  1 drop Left Eye QID  rosuvastatin   10 mg Oral Daily   Continuous Infusions:   Anti-infectives (From admission, onward)    None              Family Communication/Anticipated D/C date and plan/Code Status   DVT prophylaxis: SCDs Start: 03/22/24 2219     Code Status: Limited: Do not attempt resuscitation (DNR) -DNR-LIMITED -Do Not Intubate/DNI   Family Communication: None Disposition Plan: Plan to discharge home   Status is: Inpatient Remains inpatient appropriate because: Depression awaiting psych evaluation       Subjective:    Interval events noted.  No complaints.  No diarrhea or abdominal pain.  Overall, she feels better.  Objective:    Vitals:   03/27/24 1050 03/27/24 1226 03/27/24 1607 03/27/24 1611  BP: (!) 176/70 (!) 192/89 (!) 189/83 (!) 201/92  Pulse:  64 64 61  Resp:  19 19   Temp:  97.9 F (36.6 C) 98.1 F (36.7 C)   TempSrc:   Oral   SpO2:  100% 99%   Weight:      Height:       No data found.   Intake/Output Summary (Last 24 hours) at 03/27/2024 1628 Last data filed at 03/27/2024 1015 Gross per 24 hour  Intake 120 ml  Output --  Net 120 ml   Filed Weights   03/22/24 1833 03/22/24 2303  Weight: 52.2 kg 57.7 kg    Exam:  GEN: NAD SKIN: Warm and dry EYES: No pallor or icterus ENT: MMM CV: RRR PULM: CTA B ABD: soft, ND, NT, +BS CNS: AAO x 3, non focal EXT: No edema or tenderness         Data Reviewed:   I have personally reviewed following labs and imaging studies:  Labs: Labs show the following:   Basic Metabolic Panel: Recent Labs  Lab 03/23/24 0157 03/23/24 0813 03/23/24 1526 03/23/24 1942 03/24/24 0135 03/24/24 1054 03/25/24 0439 03/26/24 0618 03/27/24 0526  NA 142   < >  --  141 140  --  142 141 142  K 2.0*   < > 2.4* 2.2* 2.7*   < > 3.2* 3.9 3.3*  CL 100   < >  --  103 102  --  105 105 107  CO2 29   < >  --  26 27  --  26 27 27   GLUCOSE 153*   < >  --  164* 115*  --  136* 123* 99  BUN 14   < >  --  9 8  --  8 10 11   CREATININE 0.56   < >  --  0.72 0.48  --  0.53 0.65 0.72  CALCIUM  7.1*   < >  --  7.3* 7.0*  --  7.3* 7.9* 8.2*  MG 1.6*  --  1.9  --   --   --  1.6* 2.0 1.9  PHOS 2.7  --   --   --   --   --   --   --  3.0   < > = values in this interval not displayed.   GFR Estimated Creatinine Clearance: 50 mL/min (by C-G formula based on SCr of 0.72 mg/dL). Liver Function Tests: Recent Labs  Lab 03/22/24 1838 03/27/24 0526  AST 23  --   ALT 19  --   ALKPHOS 78  --   BILITOT 0.9  --   PROT 6.5  --   ALBUMIN 2.9* 2.4*   Recent Labs  Lab 03/22/24 1838  LIPASE 26   No results for input(s): AMMONIA in the last 168 hours. Coagulation profile No results for input(s): INR, PROTIME in the last 168 hours.  CBC: Recent Labs  Lab 03/23/24 0813 03/24/24 0135 03/25/24 0439 03/26/24 0618 03/27/24 0526  WBC 6.8 5.9 10.8* 5.6 5.1  HGB 10.7* 10.0* 9.9* 9.6* 9.3*  HCT 32.3* 30.2* 30.1* 29.6* 29.0*  MCV 82.0 82.1 82.2 83.6 83.6  PLT 310 272 246 237 248   Cardiac Enzymes: No results for input(s): CKTOTAL, CKMB, CKMBINDEX, TROPONINI in the last 168 hours. BNP (last 3 results) No results for input(s): PROBNP in the last 8760 hours. CBG: No results for input(s): GLUCAP in the last 168 hours. D-Dimer: No results for input(s): DDIMER in the last 72 hours. Hgb A1c: No results for input(s): HGBA1C in the last 72 hours. Lipid Profile: No results for input(s): CHOL, HDL, LDLCALC, TRIG, CHOLHDL, LDLDIRECT in the last 72 hours. Thyroid  function studies: No results for input(s): TSH, T4TOTAL, T3FREE, THYROIDAB in the last 72 hours.  Invalid input(s): FREET3 Anemia work up: No results for input(s): VITAMINB12, FOLATE, FERRITIN, TIBC, IRON, RETICCTPCT in the last 72 hours. Sepsis Labs: Recent Labs  Lab 03/24/24 0135 03/25/24 0439 03/26/24 0618 03/27/24 0526  WBC 5.9 10.8* 5.6 5.1    Microbiology Recent Results (from the past 240 hours)  Gastrointestinal Panel by PCR , Stool     Status: None   Collection Time: 03/22/24 11:50 PM   Specimen: Stool  Result Value Ref Range Status   Campylobacter species NOT DETECTED NOT DETECTED Final   Plesimonas shigelloides NOT DETECTED NOT DETECTED Final   Salmonella species NOT DETECTED NOT DETECTED Final   Yersinia enterocolitica NOT DETECTED NOT DETECTED Final   Vibrio species NOT DETECTED NOT DETECTED Final   Vibrio cholerae NOT DETECTED NOT DETECTED Final   Enteroaggregative E coli (EAEC) NOT DETECTED NOT DETECTED Final    Enteropathogenic E coli (EPEC) NOT DETECTED NOT DETECTED Final   Enterotoxigenic E coli (ETEC) NOT DETECTED NOT DETECTED Final   Shiga like toxin producing E coli (STEC) NOT DETECTED NOT DETECTED Final   Shigella/Enteroinvasive E coli (EIEC) NOT DETECTED NOT DETECTED Final   Cryptosporidium NOT DETECTED NOT DETECTED Final   Cyclospora cayetanensis NOT DETECTED NOT DETECTED Final   Entamoeba histolytica NOT DETECTED NOT DETECTED Final   Giardia lamblia NOT DETECTED NOT DETECTED Final   Adenovirus F40/41 NOT DETECTED NOT DETECTED Final   Astrovirus NOT DETECTED NOT DETECTED Final   Norovirus GI/GII NOT DETECTED NOT DETECTED Final   Rotavirus A NOT DETECTED NOT DETECTED Final   Sapovirus (I, II, IV, and V) NOT DETECTED NOT DETECTED Final    Comment: Performed at [facility], [address] [place], [place] [address]  C Difficile Quick Screen w PCR reflex     Status: None   Collection Time: 03/22/24 11:50 PM   Specimen: STOOL  Result Value Ref Range Status   C Diff antigen NEGATIVE NEGATIVE Final   C Diff toxin NEGATIVE NEGATIVE Final   C Diff interpretation No C. difficile detected.  Final    Comment: Performed at [facility], [address] [place], [place] [address]  Calprotectin, Fecal     Status: None   Collection Time: 03/23/24  8:25 PM   Specimen: Stool  Result Value Ref Range Status   Calprotectin, Fecal 76 0 - 120 ug/g Final    Comment: (NOTE) Concentration     Interpretation   Follow-Up <  5 - 50 ug/g     Normal           None >50 -120 ug/g     Borderline       Re-evaluate in 4-6 weeks    >120 ug/g     Abnormal         Repeat as clinically                                   indicated Performed At: [facility] [address] [place], [place] [id number] Jennette Shorter MD Ey:[phone]     Procedures and diagnostic studies:  No results found.             LOS: 4 days   Trystyn Dolley  [facility]   Pager on  [email or web address]. If 7PM-7AM, please contact night-coverage at [email or web address]     03/27/2024, 4:28 PM

## 2024-03-27 NOTE — Progress Notes (Signed)
 PT Cancellation Note  Patient Details Name: Lisa [name] MRN: [id number] DOB: 1945-06-17   Cancelled Treatment:    Reason Eval/Treat Not Completed: Other (comment)  Offered and encouraged x 2.  Refused stating she was too tired from visitors.  Encouraged and educated on risks/benefits but she remained firm in her decision to decline.   Lauraine Gills 03/27/2024, 3:43 PM

## 2024-03-27 NOTE — TOC Progression Note (Signed)
 Transition of Care [facility]) - Progression Note    Patient Details  Name: Lisa [name] MRN: [id number] Date of Birth: 07/17/1945  Transition of Care [facility]) CM/SW Contact  Avery Eustice L Dannielle [place], [place] Phone Number: 03/27/2024, 1:24 PM  Clinical Narrative:     CSW met with patient. No family at bedside. Home Health options discussed. Patient agreeable to working with [facility] but she wanted CSW to advise the liaison that some of the physical therapists do the bare minimum.   Patient advised of concerns for her inability to maintain her finances and her bills. She stated that she went to social services and she could not get any help. She stated that she has decided to sell her car and her home. She stated that she was thinking about getting a small one bedroom apartment for herself.   Patient is also considering an ALF.   Expected Discharge Plan: Home w Home Health Services Barriers to Discharge: Continued Medical Work up               Expected Discharge Plan and Services     Post Acute Care Choice: Durable Medical Equipment, Home Health Living arrangements for the past 2 months: Single Family Home                                       Social Drivers of Health (SDOH) Interventions SDOH Screenings   Food Insecurity: No Food Insecurity (03/23/2024)  Housing: Low Risk  (03/23/2024)  Transportation Needs: No Transportation Needs (03/23/2024)  Utilities: Not At Risk (03/23/2024)  Alcohol  Screen: Low Risk  (02/23/2023)  Depression (PHQ2-9): Low Risk  (02/02/2024)  Financial Resource Strain: Medium Risk (04/08/2023)  Physical Activity: Insufficiently Active (03/26/2021)  Social Connections: Socially Isolated (03/23/2024)  Stress: No Stress Concern Present (03/26/2021)  Tobacco Use: High Risk (03/22/2024)    Readmission Risk Interventions     No data to display

## 2024-03-27 NOTE — Plan of Care (Signed)
  Problem: Education: Goal: Knowledge of General Education information will improve Description: Including pain rating scale, medication(s)/side effects and non-pharmacologic comfort measures Outcome: Progressing   Problem: Clinical Measurements: Goal: Respiratory complications will improve Outcome: Progressing   Problem: Elimination: Goal: Will not experience complications related to urinary retention Outcome: Progressing   Problem: Pain Managment: Goal: General experience of comfort will improve and/or be controlled Outcome: Progressing

## 2024-03-28 ENCOUNTER — Other Ambulatory Visit: Payer: Self-pay

## 2024-03-28 DIAGNOSIS — R197 Diarrhea, unspecified: Secondary | ICD-10-CM | POA: Diagnosis not present

## 2024-03-28 LAB — MAGNESIUM: Magnesium: 1.9 mg/dL (ref 1.7–2.4)

## 2024-03-28 LAB — POTASSIUM: Potassium: 3.8 mmol/L (ref 3.5–5.1)

## 2024-03-28 LAB — PANCREATIC ELASTASE, FECAL: Pancreatic Elastase-1, Stool: 112 ug Elast./g — ABNORMAL LOW (ref >200)

## 2024-03-28 MED ORDER — LOPERAMIDE HCL 2 MG PO CAPS: 2.0000 mg | ORAL_CAPSULE | Freq: Two times a day (BID) | ORAL | Status: DC

## 2024-03-28 MED ORDER — HYDROCORTISONE ACETATE 25 MG RE SUPP
25.0000 mg | Freq: Two times a day (BID) | RECTAL | Status: DC
  Administered 2024-03-29: 25 mg via RECTAL
  Filled 2024-03-28 (×3): qty 1

## 2024-03-28 MED ORDER — HYDROCORTISONE ACETATE 25 MG RE SUPP
25.0000 mg | Freq: Two times a day (BID) | RECTAL | 0 refills | Status: AC
  Filled 2024-03-28: qty 12, 6d supply, fill #0

## 2024-03-28 MED ORDER — LOPERAMIDE HCL 2 MG PO CAPS: 4.0000 mg | ORAL_CAPSULE | Freq: Two times a day (BID) | ORAL | Status: DC

## 2024-03-28 MED ORDER — DIPHENOXYLATE-ATROPINE 2.5-0.025 MG PO TABS
1.0000 | ORAL_TABLET | Freq: Four times a day (QID) | ORAL | Status: DC
  Administered 2024-03-28 – 2024-03-29 (×3): 1 via ORAL
  Filled 2024-03-28 (×3): qty 1

## 2024-03-28 MED ORDER — LOPERAMIDE HCL 2 MG PO CAPS
2.0000 mg | ORAL_CAPSULE | Freq: Two times a day (BID) | ORAL | 0 refills | Status: DC
  Filled 2024-03-28: qty 28, 14d supply, fill #0

## 2024-03-28 NOTE — Plan of Care (Signed)

## 2024-03-28 NOTE — Discharge Summary (Addendum)
 Physician Discharge Summary  Lisa [name] FMW:[id number] DOB: 1945/12/30 DOA: 03/22/2024  PCP: Sowles, Krichna, MD  Admit date: 03/22/2024 Discharge date: 03/29/24  Admitted From: home  Disposition:  home   Recommendations for Outpatient Follow-up:  Follow up with PCP in 1-2 weeks F/u w/ GI, Dr. Therisa, in 1-3 weeks for elective outpatient colonoscopy   Home Health: yes Equipment/Devices: walker, 3N1  Discharge Condition: stable  CODE STATUS: DNR Diet recommendation: Regular  Brief/Interim Summary: HPI was taken from Dr. Cleatus: Lisa [name] is a 78 y.o. female with medical history significant for Prior DVT, not on AC, DM, HTN, HFpEF, chronic functional diarrhea  (hospitalized 03/2023 for diarrhea with electrolyte abnormalities), being hospitalized with a 3-day history of watery diarrhea associated with electrolyte disturbance including severe hypokalemia less than 2. Diarrhea has been watery, yellowish in color nonbloody and less frequent as every 40 minutes.  She has no nausea or vomiting.  Does have occasional crampy abdominal pain.  Denies fever or chills.  Denies affected contacts. At baseline, patient has 2 loose bowel movements daily, brown in color. In the ED, afebrile, heart rate 55 and BP 123/99 Labs significant for potassium<2, magnesium  1.4Spoke with SNP 500 index I think is something that is a a lot more extension between the problem affecting. Lipase and LFTs WNL WBC normal at 8.4 Hemoglobin 11.4, (13.1 in September 2025) Urinalysis pending EKG showed sinus bradycardia with T wave flattening No imaging done Patient treated with an LR bolus and started on IV and oral potassium repletion Admission requested   Discharge Diagnoses:  Principal Problem:   Acute hypokalemia and hypomagnesemia secondary to diarrhea Active Problems:   Hypomagnesemia   Chronic diarrhea, with acute worsening   Anemia   Sinus bradycardia   Chronic heart failure with  preserved ejection fraction (HFpEF) (HCC)   Type 2 diabetes mellitus with peripheral neuropathy (HCC)   Hypertension   History of DVT (deep vein thrombosis)   Urinary incontinence   Diarrhea  Hypokalemia: secondary to diarrhea. WNL today    Hypomagnesemia: secondary to diarrhea. WNL today   Chronic diarrhea: improving slowly. GI PCR panel & c. diff are both neg. S/p EGD which showed small hiatal hernia, normal stomach & normal examined duodenum. S/p colonoscopy showed a large amount of stool in the entire colon precluding visualization, biopsies taken, colon prep was poor. Path small bowel showed duodenal mucosa with normal villous architecture. Colonic mucosa was unremarkable and was negative for microscopic colitis, active inflammation and chronic changes. If biopsies are non-diagnostic, pt will need another colonoscopy outpatient with a better prep as per GI. Patient did not not follow through with colonoscopy nor fecal calprotectin study 06/2022. Started scheduled imodium  as per GI recs    Sinus bradycardia: continue on tele.    Normocytic anemia: H&H are labile. Will transfuse if Hb < 7.0    Chronic diastolic CHF: continue on home dose of losartan , metoprolol , farxiga . S/p IV lasix  x 1. Monitor I/Os    Urinary incontinence: continue w/ supportive care   HTN: poorly controlled. Continue on losartan , metoprolol , amlodipine     DM2: well controlled, HbA1c 6.4 SSI was d/c  Discharge Instructions  Discharge Instructions     Diet general   Complete by: As directed    Discharge instructions   Complete by: As directed    F/u w/ PCP in 1-2 weeks. F/u w/ GI, Dr. Therisa, in 1-3 weeks for elective colonoscopy outpatient   Discharge instructions   Complete by: As directed  Hold/do not take lomotil if you have constipation x 2 days.   Face-to-face encounter (required for Medicare/Medicaid patients)   Complete by: As directed    I BERNARD AYIKU certify that this patient is under my care  and that I, or a nurse practitioner or physician's assistant working with me, had a face-to-face encounter that meets the physician face-to-face encounter requirements with this patient on 03/27/2024. The encounter with the patient was in whole, or in part for the following medical condition(s) which is the primary reason for home health care (List medical condition): general weakness   The encounter with the patient was in whole, or in part, for the following medical condition, which is the primary reason for home health care: general weakness, CHF   I certify that, based on my findings, the following services are medically necessary home health services:  Nursing Physical therapy     Reason for Medically Necessary Home Health Services: Therapy- Investment Banker, Operational, Patent Examiner   My clinical findings support the need for the above services: Unsafe ambulation due to balance issues   Further, I certify that my clinical findings support that this patient is homebound due to: Unsafe ambulation due to balance issues   Home Health   Complete by: As directed    To provide the following care/treatments:  PT OT RN Home Health Aide     Increase activity slowly   Complete by: As directed       Allergies as of 03/29/2024       Reactions   Other Itching   Nucinta - Hallucinations   Tapentadol Hcl Itching   Hallucinations   Actos  [pioglitazone ]    History of CHF   Penicillins Rash        Medication List     STOP taking these medications    Gemtesa  75 MG Tabs Generic drug: Vibegron        TAKE these medications    acetaminophen  500 MG tablet Commonly known as: TYLENOL  Take 2 tablets by mouth every 6 (six) hours as needed.   amLODipine  10 MG tablet Commonly known as: NORVASC  Take 1 tablet (10 mg total) by mouth daily.   artificial tears ophthalmic solution Place 2 drops into the right eye as needed for dry eyes.   aspirin  81 MG tablet Take 81 mg by mouth  daily.   atropine  1 % ophthalmic solution Place 1 drop into the left eye 3 (three) times daily.   B-12 1000 MCG Subl Place 1 tablet under the tongue daily.   brimonidine  0.2 % ophthalmic solution Commonly known as: ALPHAGAN  Place 1 drop into both eyes 2 (two) times daily.   citalopram  10 MG tablet Commonly known as: CELEXA  Take 1 tablet (10 mg total) by mouth daily.   clopidogrel  75 MG tablet Commonly known as: Plavix  Take 1 tablet (75 mg total) by mouth daily.   dapagliflozin  propanediol 10 MG Tabs tablet Commonly known as: Farxiga  Take 1 tablet (10 mg total) by mouth daily. TAKE 1 TABLET BY MOUTH EVERY DAY BEFORE BREAKFAST   diphenoxylate-atropine  2.5-0.025 MG tablet Commonly known as: LOMOTIL Take 1 tablet by mouth 4 (four) times daily for 14 days.   dorzolamide -timolol  2-0.5 % ophthalmic solution Commonly known as: COSOPT  Place 1 drop into both eyes 2 (two) times daily.   feeding supplement Liqd Take 237 mLs by mouth 2 (two) times daily between meals.   gabapentin  100 MG capsule Commonly known as: Neurontin  Take 1 capsule (100 mg total)  by mouth 3 (three) times daily.   hydrocortisone 25 MG suppository Commonly known as: ANUSOL-HC Place 1 suppository (25 mg total) rectally 2 (two) times daily for 7 days.   losartan  100 MG tablet Commonly known as: COZAAR  Take 1 tablet (100 mg total) by mouth daily.   metoprolol  succinate 25 MG 24 hr tablet Commonly known as: Toprol  XL Take 1 tablet (25 mg total) by mouth daily. Take with or immediately following a meal.   multivitamin tablet Take 1 tablet by mouth daily.   nitroGLYCERIN  0.4 MG SL tablet Commonly known as: NITROSTAT  Place 1 tablet (0.4 mg total) under the tongue every 5 (five) minutes as needed for chest pain.   potassium chloride  SA 20 MEQ tablet Commonly known as: KLOR-CON  M Take 1 tablet (20 mEq total) by mouth daily.   prednisoLONE  acetate 1 % ophthalmic suspension Commonly known as: PRED  FORTE Place 1 drop into the left eye 4 (four) times daily.   rosuvastatin  10 MG tablet Commonly known as: CRESTOR  Take 1 tablet (10 mg total) by mouth daily.   traMADol  50 MG tablet Commonly known as: ULTRAM  Take 1 tablet (50 mg total) by mouth every 6 (six) hours as needed.   Vitamin D  (Ergocalciferol ) 1.25 MG (50000 UNIT) Caps capsule Commonly known as: DRISDOL  Take 1 capsule (50,000 Units total) by mouth every 7 (seven) days.               Durable Medical Equipment  (From admission, onward)           Start     Ordered   03/24/24 1638  For home use only DME 3 n 1  Once        03/24/24 1637            Contact information for follow-up providers     Therisa Bi, MD Follow up.   Specialty: Gastroenterology Why: F/u in 1-3 weeks for elective outpatient colonoscopy Contact information: [address] [place] [address] [phone]         Glenard Mire, MD Follow up.   Specialty: Family Medicine Why: F/u in 1-2 weeks Contact information: [address] [place] [address] [phone]              Contact information for after-discharge care     Home Medical Care     [facility]) .   Service: Home Health Services                    Allergies  Allergen Reactions   Other Itching    Nucinta - Hallucinations   Tapentadol Hcl Itching    Hallucinations   Actos  [Pioglitazone ]     History of CHF   Penicillins Rash    Consultations: GI    Procedures/Studies: DG Chest Port 1 View Result Date: 03/23/2024 EXAM: 1 VIEW(S) XRAY OF THE CHEST 03/23/2024 03:40:00 PM COMPARISON: Chest x-ray 04/07/2023. CLINICAL HISTORY: Dyspnea. FINDINGS: LUNGS AND PLEURA: There is central pulmonary vascular congestion. No focal pulmonary opacity. No pulmonary edema. No pleural effusion. No pneumothorax. HEART AND MEDIASTINUM: The heart is enlarged. The aorta is ectatic with atherosclerotic  calcification. BONES AND SOFT TISSUES: No acute osseous abnormality. IMPRESSION: 1. Enlarged heart. 2. Central pulmonary vascular congestion. Electronically signed by: Greig Pique MD 03/23/2024 07:05 PM EDT RP Workstation: [id number]   (Echo, Carotid, EGD, Colonoscopy, ERCP)    Subjective: Pt c/o intermittent diarrhea.    Discharge Exam: Vitals:  03/28/24 2313 03/29/24 0406  BP: (!) 163/74 (!) 177/84  Pulse: 61 61  Resp: 16 18  Temp: 98.3 F (36.8 C) (!) 97.5 F (36.4 C)  SpO2: 100% 100%   Vitals:   03/28/24 1207 03/28/24 2140 03/28/24 2313 03/29/24 0406  BP: 139/70 (!) 162/69 (!) 163/74 (!) 177/84  Pulse: 65 62 61 61  Resp: 18 20 16 18   Temp: 98.1 F (36.7 C) 97.9 F (36.6 C) 98.3 F (36.8 C) (!) 97.5 F (36.4 C)  TempSrc:      SpO2: 100% 98% 100% 100%  Weight:      Height:        General: Pt is alert, awake, not in acute distress Cardiovascular: S1/S2 +, no rubs, no gallops Respiratory: decreased breath sounds b/l  Abdominal: Soft, NT, ND, bowel sounds + Extremities:  no cyanosis    The results of significant diagnostics from this hospitalization (including imaging, microbiology, ancillary and laboratory) are listed below for reference.     Microbiology: Recent Results (from the past 240 hours)  Gastrointestinal Panel by PCR , Stool     Status: None   Collection Time: 03/22/24 11:50 PM   Specimen: Stool  Result Value Ref Range Status   Campylobacter species NOT DETECTED NOT DETECTED Final   Plesimonas shigelloides NOT DETECTED NOT DETECTED Final   Salmonella species NOT DETECTED NOT DETECTED Final   Yersinia enterocolitica NOT DETECTED NOT DETECTED Final   Vibrio species NOT DETECTED NOT DETECTED Final   Vibrio cholerae NOT DETECTED NOT DETECTED Final   Enteroaggregative E coli (EAEC) NOT DETECTED NOT DETECTED Final   Enteropathogenic E coli (EPEC) NOT DETECTED NOT DETECTED Final   Enterotoxigenic E coli (ETEC) NOT DETECTED NOT DETECTED Final   Shiga  like toxin producing E coli (STEC) NOT DETECTED NOT DETECTED Final   Shigella/Enteroinvasive E coli (EIEC) NOT DETECTED NOT DETECTED Final   Cryptosporidium NOT DETECTED NOT DETECTED Final   Cyclospora cayetanensis NOT DETECTED NOT DETECTED Final   Entamoeba histolytica NOT DETECTED NOT DETECTED Final   Giardia lamblia NOT DETECTED NOT DETECTED Final   Adenovirus F40/41 NOT DETECTED NOT DETECTED Final   Astrovirus NOT DETECTED NOT DETECTED Final   Norovirus GI/GII NOT DETECTED NOT DETECTED Final   Rotavirus A NOT DETECTED NOT DETECTED Final   Sapovirus (I, II, IV, and V) NOT DETECTED NOT DETECTED Final    Comment: Performed at [facility], [address] [place], [place] [address]  C Difficile Quick Screen w PCR reflex     Status: None   Collection Time: 03/22/24 11:50 PM   Specimen: STOOL  Result Value Ref Range Status   C Diff antigen NEGATIVE NEGATIVE Final   C Diff toxin NEGATIVE NEGATIVE Final   C Diff interpretation No C. difficile detected.  Final    Comment: Performed at [facility], [address] [place], [place] [address]  Calprotectin, Fecal     Status: None   Collection Time: 03/23/24  8:25 PM   Specimen: Stool  Result Value Ref Range Status   Calprotectin, Fecal 76 0 - 120 ug/g Final    Comment: (NOTE) Concentration     Interpretation   Follow-Up < 5 - 50 ug/g     Normal           None >50 -120 ug/g     Borderline       Re-evaluate in 4-6 weeks    >120 ug/g     Abnormal  Repeat as clinically                                   indicated Performed At: [facility] [address] [place], [place] [id number] Jennette Shorter MD Ey:[id number]      Labs: BNP (last 3 results) Recent Labs    04/07/23 2154  BNP 840.1*   Basic Metabolic Panel: Recent Labs  Lab 03/23/24 0157 03/23/24 0813 03/23/24 1526 03/23/24 1942 03/24/24 0135 03/24/24 1054 03/25/24 0439 03/26/24 0618 03/27/24 0526 03/28/24 0355  NA 142   < >  --  141  140  --  142 141 142  --   K 2.0*   < > 2.4* 2.2* 2.7* 3.3* 3.2* 3.9 3.3* 3.8  CL 100   < >  --  103 102  --  105 105 107  --   CO2 29   < >  --  26 27  --  26 27 27   --   GLUCOSE 153*   < >  --  164* 115*  --  136* 123* 99  --   BUN 14   < >  --  9 8  --  8 10 11   --   CREATININE 0.56   < >  --  0.72 0.48  --  0.53 0.65 0.72  --   CALCIUM  7.1*   < >  --  7.3* 7.0*  --  7.3* 7.9* 8.2*  --   MG 1.6*  --  1.9  --   --   --  1.6* 2.0 1.9 1.9  PHOS 2.7  --   --   --   --   --   --   --  3.0  --    < > = values in this interval not displayed.   Liver Function Tests: Recent Labs  Lab 03/22/24 1838 03/27/24 0526  AST 23  --   ALT 19  --   ALKPHOS 78  --   BILITOT 0.9  --   PROT 6.5  --   ALBUMIN 2.9* 2.4*   Recent Labs  Lab 03/22/24 1838  LIPASE 26   No results for input(s): AMMONIA in the last 168 hours. CBC: Recent Labs  Lab 03/23/24 0813 03/24/24 0135 03/25/24 0439 03/26/24 0618 03/27/24 0526  WBC 6.8 5.9 10.8* 5.6 5.1  HGB 10.7* 10.0* 9.9* 9.6* 9.3*  HCT 32.3* 30.2* 30.1* 29.6* 29.0*  MCV 82.0 82.1 82.2 83.6 83.6  PLT 310 272 246 237 248   Cardiac Enzymes: No results for input(s): CKTOTAL, CKMB, CKMBINDEX, TROPONINI in the last 168 hours. BNP: Invalid input(s): POCBNP CBG: No results for input(s): GLUCAP in the last 168 hours. D-Dimer No results for input(s): DDIMER in the last 72 hours. Hgb A1c No results for input(s): HGBA1C in the last 72 hours. Lipid Profile No results for input(s): CHOL, HDL, LDLCALC, TRIG, CHOLHDL, LDLDIRECT in the last 72 hours. Thyroid  function studies No results for input(s): TSH, T4TOTAL, T3FREE, THYROIDAB in the last 72 hours.  Invalid input(s): FREET3 Anemia work up No results for input(s): VITAMINB12, FOLATE, FERRITIN, TIBC, IRON, RETICCTPCT in the last 72 hours. Urinalysis    Component Value Date/Time   COLORURINE AMBER (A) 02/25/2021 2119   APPEARANCEUR Clear 04/08/2022  1525   LABSPEC 1.017 02/25/2021 2119   PHURINE 5.0 02/25/2021 2119   GLUCOSEU Negative 04/08/2022 1525   HGBUR NEGATIVE 02/25/2021 2119  BILIRUBINUR Negative 04/08/2022 1525   KETONESUR 5 (A) 02/25/2021 2119   PROTEINUR Negative 04/08/2022 1525   PROTEINUR 30 (A) 02/25/2021 2119   UROBILINOGEN 0.2 09/24/2020 1615   NITRITE Negative 04/08/2022 1525   NITRITE NEGATIVE 02/25/2021 2119   LEUKOCYTESUR Negative 04/08/2022 1525   LEUKOCYTESUR TRACE (A) 02/25/2021 2119   Sepsis Labs Recent Labs  Lab 03/24/24 0135 03/25/24 0439 03/26/24 0618 03/27/24 0526  WBC 5.9 10.8* 5.6 5.1   Microbiology Recent Results (from the past 240 hours)  Gastrointestinal Panel by PCR , Stool     Status: None   Collection Time: 03/22/24 11:50 PM   Specimen: Stool  Result Value Ref Range Status   Campylobacter species NOT DETECTED NOT DETECTED Final   Plesimonas shigelloides NOT DETECTED NOT DETECTED Final   Salmonella species NOT DETECTED NOT DETECTED Final   Yersinia enterocolitica NOT DETECTED NOT DETECTED Final   Vibrio species NOT DETECTED NOT DETECTED Final   Vibrio cholerae NOT DETECTED NOT DETECTED Final   Enteroaggregative E coli (EAEC) NOT DETECTED NOT DETECTED Final   Enteropathogenic E coli (EPEC) NOT DETECTED NOT DETECTED Final   Enterotoxigenic E coli (ETEC) NOT DETECTED NOT DETECTED Final   Shiga like toxin producing E coli (STEC) NOT DETECTED NOT DETECTED Final   Shigella/Enteroinvasive E coli (EIEC) NOT DETECTED NOT DETECTED Final   Cryptosporidium NOT DETECTED NOT DETECTED Final   Cyclospora cayetanensis NOT DETECTED NOT DETECTED Final   Entamoeba histolytica NOT DETECTED NOT DETECTED Final   Giardia lamblia NOT DETECTED NOT DETECTED Final   Adenovirus F40/41 NOT DETECTED NOT DETECTED Final   Astrovirus NOT DETECTED NOT DETECTED Final   Norovirus GI/GII NOT DETECTED NOT DETECTED Final   Rotavirus A NOT DETECTED NOT DETECTED Final   Sapovirus (I, II, IV, and V) NOT DETECTED NOT  DETECTED Final    Comment: Performed at [facility], [address] [place], [place] [address]  C Difficile Quick Screen w PCR reflex     Status: None   Collection Time: 03/22/24 11:50 PM   Specimen: STOOL  Result Value Ref Range Status   C Diff antigen NEGATIVE NEGATIVE Final   C Diff toxin NEGATIVE NEGATIVE Final   C Diff interpretation No C. difficile detected.  Final    Comment: Performed at [facility], [address] [place], [place] [address]  Calprotectin, Fecal     Status: None   Collection Time: 03/23/24  8:25 PM   Specimen: Stool  Result Value Ref Range Status   Calprotectin, Fecal 76 0 - 120 ug/g Final    Comment: (NOTE) Concentration     Interpretation   Follow-Up < 5 - 50 ug/g     Normal           None >50 -120 ug/g     Borderline       Re-evaluate in 4-6 weeks    >120 ug/g     Abnormal         Repeat as clinically                                   indicated Performed At: [facility] [address] [place], [place] [id number] Jennette Shorter MD Ey:[id number]      Time coordinating discharge: 39 minutes  SIGNED:   Anthony CHRISTELLA Pouch, MD  [facility] 03/29/2024, 1:16 PM Pager   If 7PM-7AM, please contact night-coverage [email or web address]

## 2024-03-28 NOTE — TOC Transition Note (Signed)
 Transition of Care [facility]) - Discharge Note   Patient Details  Name: Lisa [name] MRN: [id number] Date of Birth: Jul 07, 1945  Transition of Care [facility]) CM/SW Contact:  Lauraine JAYSON Carpen, LCSW Phone Number: 03/28/2024, 2:42 PM   Clinical Narrative:  Patient has orders to discharge home today. [facility] liaison is aware. No further concerns. CSW signing off.   Final next level of care: Home w Home Health Services Barriers to Discharge: Barriers Resolved   Patient Goals and CMS Choice            Discharge Placement                    Patient and family notified of of transfer: 03/28/24  Discharge Plan and Services Additional resources added to the After Visit Summary for       Post Acute Care Choice: Durable Medical Equipment, Home Health          DME Arranged: 3-N-1 DME Agency: [employer] Date DME Agency Contacted: 03/24/24   Representative spoke with at DME Agency: [facility] Arranged: PT, OT, Social Work [facility] Agency: [facility] Date [facility] Agency Contacted: 03/28/24   Representative spoke with at [facility] Agency: [place]  Social Drivers of Health (SDOH) Interventions SDOH Screenings   Food Insecurity: No Food Insecurity (03/23/2024)  Housing: Low Risk  (03/23/2024)  Transportation Needs: No Transportation Needs (03/23/2024)  Utilities: Not At Risk (03/23/2024)  Alcohol  Screen: Low Risk  (02/23/2023)  Depression (PHQ2-9): Low Risk  (02/02/2024)  Financial Resource Strain: Medium Risk (04/08/2023)  Physical Activity: Insufficiently Active (03/26/2021)  Social Connections: Socially Isolated (03/23/2024)  Stress: No Stress Concern Present (03/26/2021)  Tobacco Use: High Risk (03/22/2024)     Readmission Risk Interventions     No data to display

## 2024-03-28 NOTE — Progress Notes (Signed)
 Occupational Therapy Treatment Patient Details Name: Lisa [name] MRN: [id number] DOB: 1945-06-03 Today's Date: 03/28/2024   History of present illness Pt is a 78 y.o. female presented with worsening of diarrhea for 3 days resulting in severe acute hypokalemia and hypomagnesemia requiring admission. PMH of diabetes, DVT, CHF, hypertension, hyperlipidemia with history of chronic diarrhea resulting in electrolyte abnormalities, admission in 03/2023.      OT comments  Pt seen for OT tx. Pt apologies and reports that she had an incontinence episode of loose BM. Nurse tech came to assist. Pt completed pericare from bed level with setup and only MIN A for thoroughness with PRN VC for hygiene techniques to prevent infection. Pt log rolled indep for linens change. Pt endorsed fatigue and further ADL/mobility deferred. Continues to benefit from skilled OT services.       If plan is discharge home, recommend the following:  A little help with walking and/or transfers;A little help with bathing/dressing/bathroom;Help with stairs or ramp for entrance;Assistance with cooking/housework   Equipment Recommendations  BSC/3in1    Recommendations for Other Services      Precautions / Restrictions Precautions Precautions: Fall Recall of Precautions/Restrictions: Intact Restrictions Weight Bearing Restrictions Per Provider Order: No       Mobility Bed Mobility Overal bed mobility: Modified Independent             General bed mobility comments: rolling side to side    Transfers                   General transfer comment: declined, fatigued after bed level toileting     Balance                                           ADL either performed or assessed with clinical judgement   ADL Overall ADL's : Needs assistance/impaired                 Upper Body Dressing : Sitting;Minimal assistance Upper Body Dressing Details (indicate cue type and reason):  hospital gown       Toilet Transfer Details (indicate cue type and reason): deferred, pt had loose incontinent BM in bed, indep with rolling to assist with linens change and pericare Toileting- Clothing Manipulation and Hygiene: Bed level;Minimal assistance Toileting - Clothing Manipulation Details (indicate cue type and reason): MIN A for thoroughness with bed level pericare with VC for proper hygiene techniques to prevent infection            Extremity/Trunk Assessment              Vision       Perception     Praxis     Communication Communication Communication: No apparent difficulties   Cognition Arousal: Alert Behavior During Therapy: WFL for tasks assessed/performed                                 Following commands: Intact        Cueing   Cueing Techniques: Verbal cues  Exercises Other Exercises Other Exercises: Pt educated in activity pacing, PLB    Shoulder Instructions       General Comments      Pertinent Vitals/ Pain       Pain Assessment Pain Assessment: Faces Faces Pain Scale: Hurts a little bit Pain  Location: buttocks Pain Descriptors / Indicators: Tender, Sore, Grimacing, Guarding Pain Intervention(s): Limited activity within patient's tolerance, Monitored during session, Repositioned  Home Living                                          Prior Functioning/Environment              Frequency  Min 1X/week        Progress Toward Goals  OT Goals(current goals can now be found in the care plan section)  Progress towards OT goals: Progressing toward goals  Acute Rehab OT Goals Patient Stated Goal: get stronger OT Goal Formulation: With patient Time For Goal Achievement: 04/07/24 Potential to Achieve Goals: Good  Plan      Co-evaluation                 AM-PAC OT 6 Clicks Daily Activity     Outcome Measure   Help from another person eating meals?: None Help from another person  taking care of personal grooming?: None Help from another person toileting, which includes using toliet, bedpan, or urinal?: A Little Help from another person bathing (including washing, rinsing, drying)?: A Little Help from another person to put on and taking off regular upper body clothing?: A Little Help from another person to put on and taking off regular lower body clothing?: A Little 6 Click Score: 20    End of Session Equipment Utilized During Treatment: Oxygen  OT Visit Diagnosis: Other abnormalities of gait and mobility (R26.89);Muscle weakness (generalized) (M62.81)   Activity Tolerance Patient tolerated treatment well   Patient Left in bed;with call bell/phone within reach;with bed alarm set;with nursing/sitter in room   Nurse Communication          Time: 8644-8586 OT Time Calculation (min): 18 min  Charges: OT General Charges $OT Visit: 1 Visit OT Treatments $Self Care/Home Management : 8-22 mins  Warren SAUNDERS., MPH, MS, OTR/L ascom [phone] 03/28/24, 3:47 PM

## 2024-03-28 NOTE — Plan of Care (Signed)

## 2024-03-29 ENCOUNTER — Other Ambulatory Visit: Payer: Self-pay

## 2024-03-29 MED ORDER — DIPHENOXYLATE-ATROPINE 2.5-0.025 MG PO TABS
1.0000 | ORAL_TABLET | Freq: Four times a day (QID) | ORAL | 0 refills | Status: AC
  Filled 2024-03-29: qty 56, 14d supply, fill #0

## 2024-03-29 NOTE — Plan of Care (Signed)

## 2024-03-30 ENCOUNTER — Ambulatory Visit: Payer: Self-pay | Admitting: Gastroenterology

## 2024-04-01 DIAGNOSIS — I5032 Chronic diastolic (congestive) heart failure: Secondary | ICD-10-CM | POA: Diagnosis not present

## 2024-04-01 DIAGNOSIS — Z7984 Long term (current) use of oral hypoglycemic drugs: Secondary | ICD-10-CM | POA: Diagnosis not present

## 2024-04-01 DIAGNOSIS — G547 Phantom limb syndrome without pain: Secondary | ICD-10-CM | POA: Diagnosis not present

## 2024-04-01 DIAGNOSIS — E1142 Type 2 diabetes mellitus with diabetic polyneuropathy: Secondary | ICD-10-CM | POA: Diagnosis not present

## 2024-04-01 DIAGNOSIS — E509 Vitamin A deficiency, unspecified: Secondary | ICD-10-CM | POA: Diagnosis not present

## 2024-04-01 DIAGNOSIS — Z6821 Body mass index (BMI) 21.0-21.9, adult: Secondary | ICD-10-CM | POA: Diagnosis not present

## 2024-04-01 DIAGNOSIS — E1151 Type 2 diabetes mellitus with diabetic peripheral angiopathy without gangrene: Secondary | ICD-10-CM | POA: Diagnosis not present

## 2024-04-01 DIAGNOSIS — E46 Unspecified protein-calorie malnutrition: Secondary | ICD-10-CM | POA: Diagnosis not present

## 2024-04-01 DIAGNOSIS — K591 Functional diarrhea: Secondary | ICD-10-CM | POA: Diagnosis not present

## 2024-04-01 DIAGNOSIS — D649 Anemia, unspecified: Secondary | ICD-10-CM | POA: Diagnosis not present

## 2024-04-01 DIAGNOSIS — F17210 Nicotine dependence, cigarettes, uncomplicated: Secondary | ICD-10-CM | POA: Diagnosis not present

## 2024-04-01 DIAGNOSIS — E538 Deficiency of other specified B group vitamins: Secondary | ICD-10-CM | POA: Diagnosis not present

## 2024-04-01 DIAGNOSIS — Z7902 Long term (current) use of antithrombotics/antiplatelets: Secondary | ICD-10-CM | POA: Diagnosis not present

## 2024-04-01 DIAGNOSIS — E60 Dietary zinc deficiency: Secondary | ICD-10-CM | POA: Diagnosis not present

## 2024-04-01 DIAGNOSIS — R32 Unspecified urinary incontinence: Secondary | ICD-10-CM | POA: Diagnosis not present

## 2024-04-01 DIAGNOSIS — Z89432 Acquired absence of left foot: Secondary | ICD-10-CM | POA: Diagnosis not present

## 2024-04-01 DIAGNOSIS — I2510 Atherosclerotic heart disease of native coronary artery without angina pectoris: Secondary | ICD-10-CM | POA: Diagnosis not present

## 2024-04-01 DIAGNOSIS — E871 Hypo-osmolality and hyponatremia: Secondary | ICD-10-CM | POA: Diagnosis not present

## 2024-04-01 DIAGNOSIS — H548 Legal blindness, as defined in USA: Secondary | ICD-10-CM | POA: Diagnosis not present

## 2024-04-01 DIAGNOSIS — F330 Major depressive disorder, recurrent, mild: Secondary | ICD-10-CM | POA: Diagnosis not present

## 2024-04-01 DIAGNOSIS — I152 Hypertension secondary to endocrine disorders: Secondary | ICD-10-CM | POA: Diagnosis not present

## 2024-04-01 DIAGNOSIS — Z66 Do not resuscitate: Secondary | ICD-10-CM | POA: Diagnosis not present

## 2024-04-01 DIAGNOSIS — E559 Vitamin D deficiency, unspecified: Secondary | ICD-10-CM | POA: Diagnosis not present

## 2024-04-01 DIAGNOSIS — K449 Diaphragmatic hernia without obstruction or gangrene: Secondary | ICD-10-CM | POA: Diagnosis not present

## 2024-04-01 DIAGNOSIS — E1159 Type 2 diabetes mellitus with other circulatory complications: Secondary | ICD-10-CM | POA: Diagnosis not present

## 2024-04-07 DIAGNOSIS — I152 Hypertension secondary to endocrine disorders: Secondary | ICD-10-CM | POA: Diagnosis not present

## 2024-04-07 DIAGNOSIS — E871 Hypo-osmolality and hyponatremia: Secondary | ICD-10-CM | POA: Diagnosis not present

## 2024-04-07 DIAGNOSIS — E1159 Type 2 diabetes mellitus with other circulatory complications: Secondary | ICD-10-CM | POA: Diagnosis not present

## 2024-04-07 DIAGNOSIS — E1151 Type 2 diabetes mellitus with diabetic peripheral angiopathy without gangrene: Secondary | ICD-10-CM | POA: Diagnosis not present

## 2024-04-07 DIAGNOSIS — E1142 Type 2 diabetes mellitus with diabetic polyneuropathy: Secondary | ICD-10-CM | POA: Diagnosis not present

## 2024-04-07 DIAGNOSIS — I5032 Chronic diastolic (congestive) heart failure: Secondary | ICD-10-CM | POA: Diagnosis not present

## 2024-04-12 DIAGNOSIS — E1159 Type 2 diabetes mellitus with other circulatory complications: Secondary | ICD-10-CM | POA: Diagnosis not present

## 2024-04-12 DIAGNOSIS — I152 Hypertension secondary to endocrine disorders: Secondary | ICD-10-CM | POA: Diagnosis not present

## 2024-04-12 DIAGNOSIS — E1151 Type 2 diabetes mellitus with diabetic peripheral angiopathy without gangrene: Secondary | ICD-10-CM | POA: Diagnosis not present

## 2024-04-12 DIAGNOSIS — E1142 Type 2 diabetes mellitus with diabetic polyneuropathy: Secondary | ICD-10-CM | POA: Diagnosis not present

## 2024-04-12 DIAGNOSIS — E871 Hypo-osmolality and hyponatremia: Secondary | ICD-10-CM | POA: Diagnosis not present

## 2024-04-12 DIAGNOSIS — I5032 Chronic diastolic (congestive) heart failure: Secondary | ICD-10-CM | POA: Diagnosis not present

## 2024-04-25 ENCOUNTER — Telehealth: Payer: Self-pay

## 2024-04-25 NOTE — Telephone Encounter (Signed)
 Copied from CRM [id number]. Topic: General - Other >> Apr 25, 2024 12:59 PM Delon HERO wrote: Reason for CRM: Patient is calling to report that she is in need a letter to excuse her from [employer]. Patient is requesting the letter to be completed by tomorrow since she expected to serve on April 27, 2024. Patient would like to be contacted when the letter is ready and available for pickup. Please advise

## 2024-04-25 NOTE — Telephone Encounter (Signed)
 Called pt to advice letter is ready to pick up.

## 2024-04-30 ENCOUNTER — Other Ambulatory Visit: Payer: Self-pay

## 2024-04-30 ENCOUNTER — Inpatient Hospital Stay
Admission: EM | Admit: 2024-04-30 | Discharge: 2024-05-07 | DRG: 640 | Disposition: A | Attending: Internal Medicine | Admitting: Internal Medicine

## 2024-04-30 DIAGNOSIS — R001 Bradycardia, unspecified: Secondary | ICD-10-CM | POA: Diagnosis not present

## 2024-04-30 DIAGNOSIS — I10 Essential (primary) hypertension: Secondary | ICD-10-CM | POA: Diagnosis present

## 2024-04-30 DIAGNOSIS — H409 Unspecified glaucoma: Secondary | ICD-10-CM | POA: Diagnosis present

## 2024-04-30 DIAGNOSIS — R509 Fever, unspecified: Secondary | ICD-10-CM

## 2024-04-30 DIAGNOSIS — E11649 Type 2 diabetes mellitus with hypoglycemia without coma: Secondary | ICD-10-CM | POA: Diagnosis not present

## 2024-04-30 DIAGNOSIS — I5033 Acute on chronic diastolic (congestive) heart failure: Secondary | ICD-10-CM | POA: Diagnosis not present

## 2024-04-30 DIAGNOSIS — E876 Hypokalemia: Secondary | ICD-10-CM | POA: Diagnosis not present

## 2024-04-30 DIAGNOSIS — Z833 Family history of diabetes mellitus: Secondary | ICD-10-CM | POA: Diagnosis not present

## 2024-04-30 DIAGNOSIS — J9859 Other diseases of mediastinum, not elsewhere classified: Secondary | ICD-10-CM | POA: Diagnosis not present

## 2024-04-30 DIAGNOSIS — R531 Weakness: Principal | ICD-10-CM

## 2024-04-30 DIAGNOSIS — E871 Hypo-osmolality and hyponatremia: Secondary | ICD-10-CM | POA: Diagnosis present

## 2024-04-30 DIAGNOSIS — H538 Other visual disturbances: Secondary | ICD-10-CM | POA: Insufficient documentation

## 2024-04-30 DIAGNOSIS — K529 Noninfective gastroenteritis and colitis, unspecified: Secondary | ICD-10-CM | POA: Diagnosis present

## 2024-04-30 DIAGNOSIS — Z720 Tobacco use: Secondary | ICD-10-CM | POA: Diagnosis not present

## 2024-04-30 DIAGNOSIS — R32 Unspecified urinary incontinence: Secondary | ICD-10-CM | POA: Diagnosis present

## 2024-04-30 DIAGNOSIS — Z8249 Family history of ischemic heart disease and other diseases of the circulatory system: Secondary | ICD-10-CM | POA: Diagnosis not present

## 2024-04-30 DIAGNOSIS — D649 Anemia, unspecified: Secondary | ICD-10-CM | POA: Diagnosis present

## 2024-04-30 DIAGNOSIS — F17210 Nicotine dependence, cigarettes, uncomplicated: Secondary | ICD-10-CM | POA: Diagnosis present

## 2024-04-30 DIAGNOSIS — Z86718 Personal history of other venous thrombosis and embolism: Secondary | ICD-10-CM

## 2024-04-30 DIAGNOSIS — I110 Hypertensive heart disease with heart failure: Secondary | ICD-10-CM | POA: Diagnosis present

## 2024-04-30 DIAGNOSIS — R0602 Shortness of breath: Secondary | ICD-10-CM

## 2024-04-30 DIAGNOSIS — E785 Hyperlipidemia, unspecified: Secondary | ICD-10-CM | POA: Diagnosis present

## 2024-04-30 DIAGNOSIS — E1165 Type 2 diabetes mellitus with hyperglycemia: Secondary | ICD-10-CM | POA: Diagnosis present

## 2024-04-30 DIAGNOSIS — Z7902 Long term (current) use of antithrombotics/antiplatelets: Secondary | ICD-10-CM | POA: Diagnosis not present

## 2024-04-30 DIAGNOSIS — R0689 Other abnormalities of breathing: Secondary | ICD-10-CM | POA: Diagnosis not present

## 2024-04-30 DIAGNOSIS — H209 Unspecified iridocyclitis: Secondary | ICD-10-CM | POA: Diagnosis present

## 2024-04-30 DIAGNOSIS — I5032 Chronic diastolic (congestive) heart failure: Secondary | ICD-10-CM | POA: Diagnosis present

## 2024-04-30 DIAGNOSIS — R918 Other nonspecific abnormal finding of lung field: Secondary | ICD-10-CM | POA: Diagnosis not present

## 2024-04-30 DIAGNOSIS — J90 Pleural effusion, not elsewhere classified: Secondary | ICD-10-CM | POA: Diagnosis not present

## 2024-04-30 DIAGNOSIS — R159 Full incontinence of feces: Secondary | ICD-10-CM | POA: Diagnosis not present

## 2024-04-30 DIAGNOSIS — Z5982 Transportation insecurity: Secondary | ICD-10-CM | POA: Diagnosis not present

## 2024-04-30 DIAGNOSIS — I517 Cardiomegaly: Secondary | ICD-10-CM | POA: Diagnosis not present

## 2024-04-30 DIAGNOSIS — R0902 Hypoxemia: Secondary | ICD-10-CM | POA: Diagnosis not present

## 2024-04-30 DIAGNOSIS — E1142 Type 2 diabetes mellitus with diabetic polyneuropathy: Secondary | ICD-10-CM | POA: Diagnosis present

## 2024-04-30 DIAGNOSIS — E878 Other disorders of electrolyte and fluid balance, not elsewhere classified: Secondary | ICD-10-CM | POA: Diagnosis present

## 2024-04-30 DIAGNOSIS — K6389 Other specified diseases of intestine: Secondary | ICD-10-CM | POA: Diagnosis not present

## 2024-04-30 DIAGNOSIS — E875 Hyperkalemia: Secondary | ICD-10-CM | POA: Diagnosis not present

## 2024-04-30 DIAGNOSIS — R197 Diarrhea, unspecified: Secondary | ICD-10-CM | POA: Diagnosis not present

## 2024-04-30 DIAGNOSIS — Z880 Allergy status to penicillin: Secondary | ICD-10-CM | POA: Diagnosis not present

## 2024-04-30 DIAGNOSIS — R202 Paresthesia of skin: Secondary | ICD-10-CM | POA: Diagnosis not present

## 2024-04-30 DIAGNOSIS — F32A Depression, unspecified: Secondary | ICD-10-CM | POA: Diagnosis present

## 2024-04-30 DIAGNOSIS — G723 Periodic paralysis: Secondary | ICD-10-CM

## 2024-04-30 LAB — CBC WITH DIFFERENTIAL/PLATELET
Abs Immature Granulocytes: 0.05 K/uL (ref 0.00–0.07)
Basophils Absolute: 0 K/uL (ref 0.0–0.1)
Basophils Relative: 0 %
Eosinophils Absolute: 0 K/uL (ref 0.0–0.5)
Eosinophils Relative: 0 %
HCT: 37.7 % (ref 36.0–46.0)
Hemoglobin: 12.1 g/dL (ref 12.0–15.0)
Immature Granulocytes: 1 %
Lymphocytes Relative: 6 %
Lymphs Abs: 0.6 K/uL — ABNORMAL LOW (ref 0.7–4.0)
MCH: 26.9 pg (ref 26.0–34.0)
MCHC: 32.1 g/dL (ref 30.0–36.0)
MCV: 83.8 fL (ref 80.0–100.0)
Monocytes Absolute: 0.5 K/uL (ref 0.1–1.0)
Monocytes Relative: 5 %
Neutro Abs: 9.3 K/uL — ABNORMAL HIGH (ref 1.7–7.7)
Neutrophils Relative %: 88 %
Platelets: 278 K/uL (ref 150–400)
RBC: 4.5 MIL/uL (ref 3.87–5.11)
RDW: 19.6 % — ABNORMAL HIGH (ref 11.5–15.5)
WBC: 10.5 K/uL (ref 4.0–10.5)
nRBC: 0 % (ref 0.0–0.2)

## 2024-04-30 LAB — CBG MONITORING, ED
Glucose-Capillary: 13 mg/dL — CL (ref 70–99)
Glucose-Capillary: 44 mg/dL — CL (ref 70–99)
Glucose-Capillary: 181 mg/dL — ABNORMAL HIGH (ref 70–99)

## 2024-04-30 LAB — BASIC METABOLIC PANEL WITH GFR
Anion gap: 17 — ABNORMAL HIGH (ref 5–15)
BUN: 16 mg/dL (ref 8–23)
CO2: 32 mmol/L (ref 22–32)
Calcium: 6.4 mg/dL — CL (ref 8.9–10.3)
Chloride: 95 mmol/L — ABNORMAL LOW (ref 98–111)
Creatinine, Ser: 0.85 mg/dL (ref 0.44–1.00)
GFR, Estimated: >60 mL/min (ref >60)
Glucose, Bld: 294 mg/dL — ABNORMAL HIGH (ref 70–99)
Potassium: <2 mmol/L — CL (ref 3.5–5.1)
Sodium: 144 mmol/L (ref 135–145)

## 2024-04-30 LAB — URINALYSIS, W/ REFLEX TO CULTURE (INFECTION SUSPECTED)
Bilirubin Urine: NEGATIVE
Glucose, UA: >=500 mg/dL — AB
Ketones, ur: NEGATIVE mg/dL
Leukocytes,Ua: NEGATIVE
Nitrite: NEGATIVE
Protein, ur: NEGATIVE mg/dL
RBC / HPF: 0 RBC/hpf (ref 0–5)
Specific Gravity, Urine: 1.008 (ref 1.005–1.030)
pH: 6 (ref 5.0–8.0)

## 2024-04-30 LAB — COMPREHENSIVE METABOLIC PANEL WITH GFR
ALT: 20 U/L (ref 0–44)
AST: 43 U/L — ABNORMAL HIGH (ref 15–41)
Albumin: 3.4 g/dL — ABNORMAL LOW (ref 3.5–5.0)
Alkaline Phosphatase: 91 U/L (ref 38–126)
Anion gap: 13 (ref 5–15)
BUN: 17 mg/dL (ref 8–23)
CO2: 33 mmol/L — ABNORMAL HIGH (ref 22–32)
Calcium: 6.2 mg/dL — CL (ref 8.9–10.3)
Chloride: 97 mmol/L — ABNORMAL LOW (ref 98–111)
Creatinine, Ser: 0.84 mg/dL (ref 0.44–1.00)
GFR, Estimated: >60 mL/min (ref >60)
Glucose, Bld: 325 mg/dL — ABNORMAL HIGH (ref 70–99)
Potassium: <2 mmol/L — CL (ref 3.5–5.1)
Sodium: 143 mmol/L (ref 135–145)
Total Bilirubin: 0.7 mg/dL (ref 0.0–1.2)
Total Protein: 6.2 g/dL — ABNORMAL LOW (ref 6.5–8.1)

## 2024-04-30 LAB — LACTIC ACID, PLASMA: Lactic Acid, Venous: 1.9 mmol/L (ref 0.5–1.9)

## 2024-04-30 MED ORDER — ACETAMINOPHEN 650 MG RE SUPP: 650.0000 mg | Freq: Four times a day (QID) | RECTAL | Status: DC | PRN

## 2024-04-30 MED ORDER — INSULIN ASPART 100 UNIT/ML IJ SOLN: 0.0000 [IU] | Freq: Three times a day (TID) | INTRAMUSCULAR | Status: DC

## 2024-04-30 MED ORDER — ASPIRIN 81 MG PO TBEC
81.0000 mg | DELAYED_RELEASE_TABLET | Freq: Every day | ORAL | Status: AC
  Administered 2024-05-01 – 2024-05-07 (×6): 81 mg via ORAL
  Filled 2024-04-30 (×7): qty 1

## 2024-04-30 MED ORDER — POTASSIUM CHLORIDE 10 MEQ/100ML IV SOLN
10.0000 meq | INTRAVENOUS | Status: AC
  Administered 2024-05-01 (×2): 10 meq via INTRAVENOUS
  Filled 2024-04-30 (×2): qty 100

## 2024-04-30 MED ORDER — DEXTROSE 10 % IV SOLN: INTRAVENOUS | Status: DC

## 2024-04-30 MED ORDER — GABAPENTIN 100 MG PO CAPS
100.0000 mg | ORAL_CAPSULE | Freq: Three times a day (TID) | ORAL | Status: DC
  Administered 2024-05-01 – 2024-05-07 (×7): 100 mg via ORAL
  Filled 2024-04-30 (×15): qty 1

## 2024-04-30 MED ORDER — SODIUM CHLORIDE 0.9 % IV SOLN: INTRAVENOUS | Status: AC | PRN

## 2024-04-30 MED ORDER — ENOXAPARIN SODIUM 40 MG/0.4ML IJ SOSY
40.0000 mg | PREFILLED_SYRINGE | INTRAMUSCULAR | Status: DC
  Administered 2024-04-30 – 2024-05-06 (×7): 40 mg via SUBCUTANEOUS
  Filled 2024-04-30 (×7): qty 0.4

## 2024-04-30 MED ORDER — DEXTROSE 50 % IV SOLN
INTRAVENOUS | Status: AC
  Filled 2024-04-30: qty 50

## 2024-04-30 MED ORDER — CALCIUM GLUCONATE-NACL 1-0.675 GM/50ML-% IV SOLN
1.0000 g | Freq: Once | INTRAVENOUS | Status: AC
  Administered 2024-04-30: 1000 mg via INTRAVENOUS
  Filled 2024-04-30: qty 50

## 2024-04-30 MED ORDER — LOPERAMIDE HCL 2 MG PO CAPS
2.0000 mg | ORAL_CAPSULE | Freq: Two times a day (BID) | ORAL | Status: DC | PRN
  Administered 2024-04-30: 2 mg via ORAL
  Filled 2024-04-30: qty 1

## 2024-04-30 MED ORDER — LOSARTAN POTASSIUM 50 MG PO TABS
100.0000 mg | ORAL_TABLET | Freq: Every day | ORAL | Status: AC
  Administered 2024-04-30 – 2024-05-07 (×7): 100 mg via ORAL
  Filled 2024-04-30 (×8): qty 2

## 2024-04-30 MED ORDER — HYDRALAZINE HCL 20 MG/ML IJ SOLN
10.0000 mg | Freq: Four times a day (QID) | INTRAMUSCULAR | Status: DC | PRN
  Administered 2024-05-03 – 2024-05-06 (×4): 10 mg via INTRAVENOUS
  Filled 2024-04-30 (×4): qty 1

## 2024-04-30 MED ORDER — ACETAMINOPHEN 325 MG PO TABS
650.0000 mg | ORAL_TABLET | Freq: Four times a day (QID) | ORAL | Status: DC | PRN
  Administered 2024-05-02: 650 mg via ORAL
  Filled 2024-04-30 (×2): qty 2

## 2024-04-30 MED ORDER — POLYETHYLENE GLYCOL 3350 17 G PO PACK: 17.0000 g | PACK | Freq: Every day | ORAL | Status: DC | PRN

## 2024-04-30 MED ORDER — METOPROLOL SUCCINATE ER 25 MG PO TB24
25.0000 mg | ORAL_TABLET | Freq: Every day | ORAL | Status: DC
  Administered 2024-04-30 – 2024-05-01 (×2): 25 mg via ORAL
  Filled 2024-04-30 (×2): qty 1

## 2024-04-30 MED ORDER — POTASSIUM CHLORIDE CRYS ER 20 MEQ PO TBCR: 20.0000 meq | EXTENDED_RELEASE_TABLET | Freq: Every day | ORAL | Status: DC

## 2024-04-30 MED ORDER — DEXTROSE 50 % IV SOLN
1.0000 | Freq: Once | INTRAVENOUS | Status: DC
  Filled 2024-04-30: qty 50

## 2024-04-30 MED ORDER — ROSUVASTATIN CALCIUM 10 MG PO TABS
10.0000 mg | ORAL_TABLET | Freq: Every day | ORAL | Status: AC
  Administered 2024-04-30 – 2024-05-07 (×7): 10 mg via ORAL
  Filled 2024-04-30 (×8): qty 1

## 2024-04-30 MED ORDER — DEXTROSE 50 % IV SOLN
1.0000 | Freq: Once | INTRAVENOUS | Status: AC
  Administered 2024-04-30: 50 mL via INTRAVENOUS

## 2024-04-30 MED ORDER — POTASSIUM CHLORIDE 20 MEQ PO PACK
40.0000 meq | PACK | Freq: Once | ORAL | Status: AC
  Administered 2024-04-30: 40 meq via ORAL
  Filled 2024-04-30: qty 2

## 2024-04-30 MED ORDER — INSULIN ASPART 100 UNIT/ML IJ SOLN: 0.0000 [IU] | Freq: Every day | INTRAMUSCULAR | Status: DC

## 2024-04-30 MED ORDER — OXYCODONE HCL 5 MG PO TABS: 5.0000 mg | ORAL_TABLET | ORAL | Status: DC | PRN

## 2024-04-30 MED ORDER — POTASSIUM CHLORIDE 10 MEQ/100ML IV SOLN
10.0000 meq | INTRAVENOUS | Status: AC
  Administered 2024-04-30 (×4): 10 meq via INTRAVENOUS
  Filled 2024-04-30 (×4): qty 100

## 2024-04-30 MED ORDER — CLOPIDOGREL BISULFATE 75 MG PO TABS
75.0000 mg | ORAL_TABLET | Freq: Every day | ORAL | Status: AC
  Administered 2024-04-30 – 2024-05-07 (×7): 75 mg via ORAL
  Filled 2024-04-30 (×8): qty 1

## 2024-04-30 NOTE — ED Notes (Signed)
 Spoke with EDP Bradler face to face. BMP to be repeated to verify glucose, calcium  and potassium levels.

## 2024-04-30 NOTE — ED Notes (Signed)
Provided pillows

## 2024-04-30 NOTE — ED Notes (Signed)
 Spoke with EDP re: low CBG reading--see new orders. Then spoke to lab on the phone re: critical potassium of <2 and critical calcium  of 6.2. On labwork, BG is 344. EDP Bradler informed.

## 2024-04-30 NOTE — ED Notes (Signed)
 This RN provided full linen change and pericare after pt had a episode of urine incontinence. Brief applied. Pt tolerated well. Warm blankets given. Call bell within reach.

## 2024-04-30 NOTE — H&P (Addendum)
 History and Physical    [name] FMW:[id number] DOB: Jul 26, 1945 DOA: 04/30/2024  DOS: the patient was seen and examined on 04/30/2024  PCP: Lisa Mire, MD   Patient coming from: Home  I have personally briefly reviewed patient's old medical records in [facility]  Chief Complaint: Generalized weakness requiring using walker  HPI: Lisa [name] is a pleasant 78 y.o. female with medical history significant for chronic functional diarrhea, chronic electrolyte imbalance, prior history of DVT not on anticoagulation, HTN, HLD, CHF, DM, depression who was recently at [facility] between 03/14/2024 and 03/29/2024 for abdominal pain.  At that time she underwent EGD which showed a small hiatal hernia, normal stomach, normal examined duodenum.  S/p colonoscopy showed a large amount of stool in the entire colon precluding visualization, biopsy taken, colon preparation was poor.  Pathology showed duodenal mucosa with villous architecture.  Colonic mucosa was unremarkable and was negative for microscopic colitis, active inflammation and chronic changes.  If biopsies are known diagnostic, patient will need another colonoscopy outpatient with better preparation as per GI.  Patient did not follow through with colonoscopy nor fecal calprotectin study 06/2022.  Patient was started on scheduled Imodium  per GI recommendation that time.   Today patient came into the hospital complaining of lysed weakness at home to the point that she had to use walker.  She says she has been having usual diarrhea no problem.  She has no problem eating.  She denies any other symptoms.  ED Course: Upon arrival to the ED, patient is found to be severely low potassium at less than 2.0, low calcium  at 6.4, high anion gap at 17, bicarb was 32.  White count was normal.  Patient denied any other symptoms.  Patient was replaced with potassium and calcium  and hospital service was consulted for evaluation for admission for  generalized weakness and electrolyte abnormalities.  Review of Systems:  ROS  All other systems negative except as noted in the HPI.  Past Medical History:  Diagnosis Date   Blindness of right eye    CHF (congestive heart failure) (HCC)    Depression    Diabetes mellitus without complication (HCC)    DVT of leg (deep venous thrombosis) (HCC) 2011   Glaucoma    Grade II diastolic dysfunction    Hyperlipidemia    Hypertension    Vascular disease     Past Surgical History:  Procedure Laterality Date   ABDOMINAL HYSTERECTOMY  1992   Total   CATARACT EXTRACTION W/PHACO Left 02/25/2023   Procedure: CATARACT EXTRACTION PHACO AND INTRAOCULAR LENS PLACEMENT (IOC) LEFT DIABETIC  malyugin;  Surgeon: Lisa Gaskin, MD;  Location[facility];  Service: Ophthalmology;  Laterality: Left;  10.78 0:49.1   COLONOSCOPY N/A 03/24/2024   Procedure: COLONOSCOPY;  Surgeon: Lisa Carmine, MD;  Location: [facility] END[facility];  Service: Endoscopy;  Laterality: N/A;   ESOPHAGOGASTRODUODENOSCOPY N/A 03/24/2024   Procedure: EGD (ESOPHAGOGASTRODUODENOSCOPY);  Surgeon: Lisa Carmine, MD;  Location: [facility] ENDOSCOPY;  Service: Endoscopy;  Laterality: N/A;   EYE SURGERY     LOWER EXTREMITY ANGIOGRAPHY Left 09/03/2023   Procedure: Lower Extremity Angiography;  Surgeon: Lisa Selinda RAMAN, MD;  Location: [facility] INVASIVE CV LAB;  Service: Cardiovascular;  Laterality: Left;   LOWER EXTREMITY ANGIOGRAPHY Left 11/05/2023   Procedure: Lower Extremity Angiography;  Surgeon: Lisa Selinda RAMAN, MD;  Location: [facility] INVASIVE CV LAB;  Service: Cardiovascular;  Laterality: Left;   LOWER EXTREMITY INTERVENTION Left 09/03/2023   Procedure: LOWER EXTREMITY INTERVENTION;  Surgeon: Lisa,  Selinda RAMAN, MD;  Location: [facility] INVASIVE CV LAB;  Service: Cardiovascular;  Laterality: Left;   LOWER EXTREMITY INTERVENTION Left 11/05/2023   Procedure: LOWER EXTREMITY INTERVENTION;  Surgeon: Lisa Selinda RAMAN, MD;  Location: [facility] INVASIVE CV LAB;  Service:  Cardiovascular;  Laterality: Left;   TOE AMPUTATION Left 2011   All of her toes on the left foot   VASCULAR SURGERY       reports that she has been smoking cigarettes. She started smoking about 24 years ago. She has a 10.3 pack-year smoking history. She has never used smokeless tobacco. She reports current alcohol  use. She reports that she does not use drugs.  Allergies  Allergen Reactions   Other Itching    Nucinta - Hallucinations   Tapentadol Hcl Itching    Hallucinations   Actos  [Pioglitazone ]     History of CHF   Penicillins Rash    Family History  Problem Relation Age of Onset   Heart disease Father    Diabetes Mother    Diabetes Brother    Kidney disease Brother        Transplant   Heart disease Brother    Diabetes Sister    Diabetes Sister     Prior to Admission medications   Medication Sig Start Date End Date Taking? Authorizing Provider  acetaminophen  (TYLENOL ) 500 MG tablet Take 2 tablets by mouth every 6 (six) hours as needed.    [provider]  amLODipine  (NORVASC ) 10 MG tablet Take 1 tablet (10 mg total) by mouth daily. Patient not taking: Reported on 03/23/2024 02/02/24   [name], Krichna, MD  aspirin  81 MG tablet Take 81 mg by mouth daily.    [provider]  atropine  1 % ophthalmic solution Place 1 drop into the left eye 3 (three) times daily.    [provider]  brimonidine  (ALPHAGAN ) 0.2 % ophthalmic solution Place 1 drop into both eyes 2 (two) times daily.    [provider]  citalopram  (CELEXA ) 10 MG tablet Take 1 tablet (10 mg total) by mouth daily. 02/02/24   [name], Krichna, MD  clopidogrel  (PLAVIX ) 75 MG tablet Take 1 tablet (75 mg total) by mouth daily. 11/05/23   [name], Lisa S, MD  Cyanocobalamin  (B-12) 1000 MCG SUBL Place 1 tablet under the tongue daily. 02/02/24   [name], Krichna, MD  dapagliflozin  propanediol (FARXIGA ) 10 MG TABS tablet Take 1 tablet (10 mg total) by mouth daily. TAKE 1 TABLET BY MOUTH EVERY DAY  BEFORE BREAKFAST 02/02/24   [name], Krichna, MD  dorzolamide -timolol  (COSOPT ) 22.3-6.8 MG/ML ophthalmic solution Place 1 drop into both eyes 2 (two) times daily. 03/02/21   [provider]  feeding supplement (ENSURE ENLIVE / ENSURE PLUS) LIQD Take 237 mLs by mouth 2 (two) times daily between meals. 04/14/23   Jhonny Calvin NOVAK, MD  gabapentin  (NEURONTIN ) 100 MG capsule Take 1 capsule (100 mg total) by mouth 3 (three) times daily. 11/05/23 11/04/24  Lisa Selinda RAMAN, MD  losartan  (COZAAR ) 100 MG tablet Take 1 tablet (100 mg total) by mouth daily. 02/02/24   [name], Krichna, MD  metoprolol  succinate (TOPROL  XL) 25 MG 24 hr tablet Take 1 tablet (25 mg total) by mouth daily. Take with or immediately following a meal. 02/02/24 02/01/25  [name], Krichna, MD  Multiple Vitamin (MULTIVITAMIN) tablet Take 1 tablet by mouth daily.    [provider]  nitroGLYCERIN  (NITROSTAT ) 0.4 MG SL tablet Place 1 tablet (0.4 mg total) under the tongue every 5 (five) minutes as  needed for chest pain. 04/13/21   Patel, Sona, MD  polyvinyl alcohol  (LIQUIFILM TEARS) 1.4 % ophthalmic solution Place 2 drops into the right eye as needed for dry eyes. 04/14/23   Jhonny Calvin NOVAK, MD  potassium chloride  SA (KLOR-CON  M) 20 MEQ tablet Take 1 tablet (20 mEq total) by mouth daily. 02/03/24   [name], Krichna, MD  prednisoLONE  acetate (PRED FORTE ) 1 % ophthalmic suspension Place 1 drop into the left eye 4 (four) times daily. 03/07/21   [provider]  rosuvastatin  (CRESTOR ) 10 MG tablet Take 1 tablet (10 mg total) by mouth daily. 02/02/24   [name], Krichna, MD  traMADol  (ULTRAM ) 50 MG tablet Take 1 tablet (50 mg total) by mouth every 6 (six) hours as needed. 08/25/23   Lisa Selinda RAMAN, MD  Vitamin D , Ergocalciferol , (DRISDOL ) 1.25 MG (50000 UNIT) CAPS capsule Take 1 capsule (50,000 Units total) by mouth every 7 (seven) days. 02/02/24   [name], Krichna, MD    Physical Exam: Vitals:   04/30/24 1453 04/30/24 1454 04/30/24 1457  04/30/24 1501  BP: (!) 181/61     Pulse: 64     Resp: 13     Temp:   97.9 F (36.6 C)   TempSrc:   Oral   SpO2:    100%  Weight:  57.5 kg    Height:  5' 4 (1.626 m)      Physical Exam   Constitutional: Alert, awake, calm, comfortable HEENT: Neck supple Respiratory: Clear to auscultation B/L, no wheezing, no rales.  Cardiovascular: Regular rate and rhythm, no murmurs / rubs / gallops. No extremity edema. 2+ pedal pulses. No carotid bruits.  Abdomen: Soft, no tenderness, Bowel sounds positive.  Musculoskeletal: no clubbing / cyanosis. Good ROM, no contractures. Normal muscle tone.  Skin: no rashes, lesions, ulcers. Neurologic: CN 2-12 grossly intact. Sensation intact, No focal deficit identified Psychiatric: Alert and oriented x 3. Normal mood.    Labs on Admission: I have personally reviewed following labs and imaging studies  CBC: Recent Labs  Lab 04/30/24 1503  WBC 10.5  NEUTROABS 9.3*  HGB 12.1  HCT 37.7  MCV 83.8  PLT 278   Basic Metabolic Panel: Recent Labs  Lab 04/30/24 1503 04/30/24 1605  NA 143 144  K <2.0* <2.0*  CL 97* 95*  CO2 33* 32  GLUCOSE 325* 294*  BUN 17 16  CREATININE 0.84 0.85  CALCIUM  6.2* 6.4*   GFR: Estimated Creatinine Clearance: 47.1 mL/min (by C-G formula based on SCr of 0.85 mg/dL). Liver Function Tests: Recent Labs  Lab 04/30/24 1503  AST 43*  ALT 20  ALKPHOS 91  BILITOT 0.7  PROT 6.2*  ALBUMIN 3.4*   No results for input(s): LIPASE, AMYLASE in the last 168 hours. No results for input(s): AMMONIA in the last 168 hours. Coagulation Profile: No results for input(s): INR, PROTIME in the last 168 hours. Cardiac Enzymes: No results for input(s): CKTOTAL, CKMB, CKMBINDEX, TROPONINI, TROPONINIHS in the last 168 hours. BNP (last 3 results) No results for input(s): BNP in the last 8760 hours. HbA1C: No results for input(s): HGBA1C in the last 72 hours. CBG: Recent Labs  Lab 04/30/24 1458  04/30/24 1530  GLUCAP 13* 44*   Lipid Profile: No results for input(s): CHOL, HDL, LDLCALC, TRIG, CHOLHDL, LDLDIRECT in the last 72 hours. Thyroid  Function Tests: No results for input(s): TSH, T4TOTAL, FREET4, T3FREE, THYROIDAB in the last 72 hours. Anemia Panel: No results for input(s): VITAMINB12, FOLATE, FERRITIN, TIBC, IRON, RETICCTPCT in the  last 72 hours. Urine analysis:    Component Value Date/Time   COLORURINE COLORLESS (A) 04/30/2024 1531   APPEARANCEUR CLEAR (A) 04/30/2024 1531   APPEARANCEUR Clear 04/08/2022 1525   LABSPEC 1.008 04/30/2024 1531   PHURINE 6.0 04/30/2024 1531   GLUCOSEU >=500 (A) 04/30/2024 1531   HGBUR SMALL (A) 04/30/2024 1531   BILIRUBINUR NEGATIVE 04/30/2024 1531   BILIRUBINUR Negative 04/08/2022 1525   KETONESUR NEGATIVE 04/30/2024 1531   PROTEINUR NEGATIVE 04/30/2024 1531   UROBILINOGEN 0.2 09/24/2020 1615   NITRITE NEGATIVE 04/30/2024 1531   LEUKOCYTESUR NEGATIVE 04/30/2024 1531    Radiological Exams on Admission: I have personally reviewed images No results found.  EKG: My personal interpretation of EKG shows: Sinus rhythm at 53 bpm with flat T waves    Assessment/Plan Active Problems:   Hypomagnesemia   Chronic diarrhea of unknown origin   Sinus bradycardia   Chronic heart failure with preserved ejection fraction (HFpEF) (HCC)   Benign essential HTN   Type 2 diabetes mellitus with peripheral neuropathy (HCC)   Tobacco use   History of DVT (deep vein thrombosis)   Urinary incontinence   Hypokalemia    Assessment and Plan: 78 year old female with chronic functional diarrhea with negative workup during last admission at [facility], HTN, DM, CHF, sinus bradycardia, chronic electrolyte abnormalities, urinary incontinence, history of DVT not on anticoagulation who came into ED complaining of generalized weakness.  1.  Severe electrolyte abnormalities - She has potassium less than 2 and calcium  6.4. -  Patient received potassium in the ED IV and calcium  gluconate. - Will continue to give calcium  and potassium and check potassium and calcium  level. - She will be monitored in telemetry and progressive care unit.  2.  Chronic diarrhea and chronic urinary incontinence - Workup for chronic diarrhea was done without any definitive finding. - Patient was advised to continue loperamide . - Will continue loperamide  here  3.  Hyperglycemia in the setting of type II diabetes - Her blood sugars are all over the place.  Some blood sugars are high some blood sugars are low.  Will continue to monitor.  As she is able to eat and drink okay this would be okay. - Will place her on insulin  sliding scale - Continue to check blood glucose.  4.  HTN - Continue her home medications - Monitor blood pressure metoprolol  and losartan  - Give hydralazine  as needed for systolic blood pressure more than 160  5.  Sinus bradycardia - It appears chronic - Patient is on metoprolol  - Continue to monitor  6.  Hyperlipidemia - Continue Crestor  10 mg  7.  Deconditioning/generalized weakness - She will have a physical and Occupational Therapy evaluation - Patient is interested to go to skilled nursing facility for rehab  8.  Chronic tobacco dependence - Extensive counseling was done regarding smoking cessation      DVT prophylaxis: Lovenox  Code Status: Full Code Family Communication: None  Disposition Plan: Home vs skilled Rehab  Consults called: None  Admission status: Observation, Progressive   Nena Rebel, MD [facility] 04/30/2024, 5:43 PM

## 2024-04-30 NOTE — ED Notes (Signed)
 Peri care, purewick placed. Looking for IV pump.

## 2024-04-30 NOTE — ED Notes (Signed)
 Assisted RN with changing pt brief, sheets, and linen pad. Pt bed is in the lowest position.

## 2024-04-30 NOTE — ED Notes (Signed)
 CBG 44. EDP informed requested continuous infusion.

## 2024-04-30 NOTE — ED Provider Notes (Signed)
 [facility] Provider Note   Event Date/Time   First MD Initiated Contact with Patient 04/30/24 1458     (approximate) History  Weakness and Hypoglycemia HPI Lisa [name] is a 78 y.o. female with a past medical history of hypertension, hyperlipidemia, type 2 diabetes, and heart failure who presents complaining of generalized weakness and EMS found hypoglycemia.  Patient's glucose is 13 upon arrival D50 ordered.  Patient states that she has not had any change in her antihyperglycemics recently.  Patient is on Farxiga  and has not had a change in dosing. ROS: Patient currently denies any vision changes, tinnitus, difficulty speaking, facial droop, sore throat, chest pain, shortness of breath, abdominal pain, nausea/vomiting/diarrhea, or weakness/numbness/paresthesias in any extremity   Physical Exam  Triage Vital Signs: ED Triage Vitals  Encounter Vitals Group     BP 04/30/24 1453 (!) 181/61     Girls Systolic BP Percentile --      Girls Diastolic BP Percentile --      Boys Systolic BP Percentile --      Boys Diastolic BP Percentile --      Pulse Rate 04/30/24 1453 64     Resp 04/30/24 1453 13     Temp 04/30/24 1457 97.9 F (36.6 C)     Temp Source 04/30/24 1457 Oral     SpO2 04/30/24 1501 100 %     Weight 04/30/24 1454 126 lb 12.2 oz (57.5 kg)     Height 04/30/24 1454 5' 4 (1.626 m)     Head Circumference --      Peak Flow --      Pain Score 04/30/24 1454 0     Pain Loc --      Pain Education --      Exclude from Growth Chart --    Most recent vital signs: Vitals:   04/30/24 1930 04/30/24 1931  BP: (!) 161/73   Pulse:    Resp:  19  Temp:    SpO2:     General: Awake, oriented x4. CV:  Good peripheral perfusion. Resp:  Normal effort. Abd:  No distention. Other:  Well-developed, well-nourished elderly African-American female resting comfortably in no acute distress ED Results / Procedures / Treatments  Labs (all labs ordered are listed,  but only abnormal results are displayed) Labs Reviewed  COMPREHENSIVE METABOLIC PANEL WITH GFR - Abnormal; Notable for the following components:      Result Value   Potassium <2.0 (*)    Chloride 97 (*)    CO2 33 (*)    Glucose, Bld 325 (*)    Calcium  6.2 (*)    Total Protein 6.2 (*)    Albumin 3.4 (*)    AST 43 (*)    All other components within normal limits  CBC WITH DIFFERENTIAL/PLATELET - Abnormal; Notable for the following components:   RDW 19.6 (*)    Neutro Abs 9.3 (*)    Lymphs Abs 0.6 (*)    All other components within normal limits  URINALYSIS, W/ REFLEX TO CULTURE (INFECTION SUSPECTED) - Abnormal; Notable for the following components:   Color, Urine COLORLESS (*)    APPearance CLEAR (*)    Glucose, UA >=500 (*)    Hgb urine dipstick SMALL (*)    Bacteria, UA RARE (*)    All other components within normal limits  BASIC METABOLIC PANEL WITH GFR - Abnormal; Notable for the following components:   Potassium <2.0 (*)    Chloride 95 (*)  Glucose, Bld 294 (*)    Calcium  6.4 (*)    Anion gap 17 (*)    All other components within normal limits  CBG MONITORING, ED - Abnormal; Notable for the following components:   Glucose-Capillary 13 (*)    All other components within normal limits  CBG MONITORING, ED - Abnormal; Notable for the following components:   Glucose-Capillary 44 (*)    All other components within normal limits  LACTIC ACID, PLASMA  COMPREHENSIVE METABOLIC PANEL WITH GFR  CBC  PROTIME-INR  PHOSPHORUS  MAGNESIUM    EKG ED ECG REPORT I, Artist MARLA Kerns, the attending physician, personally viewed and interpreted this ECG. Date: 04/30/2024 EKG Time: 1458 Rate: 53 Rhythm: Bradycardic sinus rhythm QRS Axis: normal Intervals: Left anterior fascicular block ST/T Wave abnormalities: normal Narrative Interpretation: Bradycardic sinus rhythm no evidence of acute ischemia PROCEDURES: Critical Care performed: No Procedures MEDICATIONS ORDERED IN  ED: Medications  dextrose  50 % solution 50 mL (50 mLs Intravenous Not Given 04/30/24 1645)  dextrose  10 % infusion ( Intravenous Not Given 04/30/24 1646)  0.9 %  sodium chloride  infusion ( Intravenous New Bag/Given 04/30/24 1641)  enoxaparin  (LOVENOX ) injection 40 mg (has no administration in time range)  acetaminophen  (TYLENOL ) tablet 650 mg (has no administration in time range)    Or  acetaminophen  (TYLENOL ) suppository 650 mg (has no administration in time range)  oxyCODONE  (Oxy IR/ROXICODONE ) immediate release tablet 5 mg (has no administration in time range)  polyethylene glycol (MIRALAX  / GLYCOLAX ) packet 17 g (has no administration in time range)  potassium chloride  10 mEq in 100 mL IVPB (has no administration in time range)  hydrALAZINE  (APRESOLINE ) injection 10 mg (has no administration in time range)  aspirin  tablet 81 mg (has no administration in time range)  clopidogrel  (PLAVIX ) tablet 75 mg (has no administration in time range)  gabapentin  (NEURONTIN ) capsule 100 mg (has no administration in time range)  losartan  (COZAAR ) tablet 100 mg (100 mg Oral Given 04/30/24 2114)  metoprolol  succinate (TOPROL -XL) 24 hr tablet 25 mg (has no administration in time range)  potassium chloride  SA (KLOR-CON  M) CR tablet 20 mEq (has no administration in time range)  rosuvastatin  (CRESTOR ) tablet 10 mg (has no administration in time range)  loperamide  (IMODIUM ) capsule 2 mg (has no administration in time range)  insulin  aspart (novoLOG ) injection 0-9 Units (has no administration in time range)  insulin  aspart (novoLOG ) injection 0-5 Units (has no administration in time range)  dextrose  50 % solution 50 mL (50 mLs Intravenous Given 04/30/24 1500)  potassium chloride  10 mEq in 100 mL IVPB (0 mEq Intravenous Stopped 04/30/24 2145)  calcium  gluconate 1 g/ 50 mL sodium chloride  IVPB (0 mg Intravenous Stopped 04/30/24 1739)  potassium chloride  (KLOR-CON ) packet 40 mEq (40 mEq Oral Given 04/30/24 1704)    IMPRESSION / MDM / ASSESSMENT AND PLAN / ED COURSE  I reviewed the triage vital signs and the nursing notes.                             The patient is on the cardiac monitor to evaluate for evidence of arrhythmia and/or significant heart rate changes. Patient's presentation is most consistent with acute presentation with potential threat to life or bodily function. Patient is a 78 year old female with the above-stated past medical history who presents complaining of generalized weakness and hypoglycemia. DDx: Sepsis, UTI, medication overdose, insulinoma Plan: CBC, CMP, UA, lactic acid  Patient's laboratory evaluation somewhat  confusing based on peripheral glucose not matching our metabolic panel glucose.  Given the accuracy, I will use the metabolic panel glucose and therefore patient does not need any further glucose.  Patient does however have significant electrolyte derangements including severe hypokalemia as well as hypocalcemia in the setting of recent diarrhea.  Patient has had no diarrhea here to send for evaluation.  Given these electrolyte derangements, patient will require admission to the internal medicine service for further evaluation and management.  Dispo: Admit to medicine   FINAL CLINICAL IMPRESSION(S) / ED DIAGNOSES   Final diagnoses:  Generalized weakness  Hypokalemia  Hypocalcemia   Rx / DC Orders   ED Discharge Orders     None      Note:  This document was prepared using Dragon voice recognition software and may include unintentional dictation errors.   Jossie Artist POUR, MD 04/30/24 [phone]

## 2024-04-30 NOTE — ED Notes (Signed)
 Cbg 13 via finger stick

## 2024-04-30 NOTE — ED Triage Notes (Signed)
 Pt BIB [facility] from home c/o weakness. Pt endorses not being able to get up like she usually can. EMS on scene reported a CBG of 29. EMS gave d10 and 15g of oral glucose. Last cbg was 38. Pt has diabetes and took her diabetic meds today. Alert. NAD.   234/81 77 HR 22 RR 97.7 temp

## 2024-04-30 NOTE — ED Notes (Signed)
 K+ <2, calcium  6.4, glucose 294 on repeat BMP called by lab. EDP Bradler informed.

## 2024-04-30 NOTE — ED Notes (Signed)
 Not giving the dextrose  for now per verbal order EDP Bradler because pending repeat BMP results.

## 2024-04-30 NOTE — ED Notes (Signed)
 Hospitalist at bedside

## 2024-05-01 ENCOUNTER — Inpatient Hospital Stay

## 2024-05-01 ENCOUNTER — Encounter: Payer: Self-pay | Admitting: Internal Medicine

## 2024-05-01 DIAGNOSIS — I10 Essential (primary) hypertension: Secondary | ICD-10-CM

## 2024-05-01 DIAGNOSIS — R001 Bradycardia, unspecified: Secondary | ICD-10-CM

## 2024-05-01 DIAGNOSIS — Z720 Tobacco use: Secondary | ICD-10-CM

## 2024-05-01 DIAGNOSIS — G723 Periodic paralysis: Secondary | ICD-10-CM

## 2024-05-01 DIAGNOSIS — K6389 Other specified diseases of intestine: Secondary | ICD-10-CM | POA: Diagnosis not present

## 2024-05-01 DIAGNOSIS — I517 Cardiomegaly: Secondary | ICD-10-CM | POA: Diagnosis not present

## 2024-05-01 DIAGNOSIS — E876 Hypokalemia: Secondary | ICD-10-CM

## 2024-05-01 DIAGNOSIS — E11649 Type 2 diabetes mellitus with hypoglycemia without coma: Secondary | ICD-10-CM | POA: Insufficient documentation

## 2024-05-01 DIAGNOSIS — K529 Noninfective gastroenteritis and colitis, unspecified: Secondary | ICD-10-CM | POA: Diagnosis not present

## 2024-05-01 DIAGNOSIS — J9859 Other diseases of mediastinum, not elsewhere classified: Secondary | ICD-10-CM | POA: Insufficient documentation

## 2024-05-01 DIAGNOSIS — I5032 Chronic diastolic (congestive) heart failure: Secondary | ICD-10-CM

## 2024-05-01 DIAGNOSIS — R159 Full incontinence of feces: Secondary | ICD-10-CM | POA: Diagnosis not present

## 2024-05-01 DIAGNOSIS — E785 Hyperlipidemia, unspecified: Secondary | ICD-10-CM

## 2024-05-01 LAB — COMPREHENSIVE METABOLIC PANEL WITH GFR
ALT: 17 U/L (ref 0–44)
AST: 38 U/L (ref 15–41)
Albumin: 2.9 g/dL — ABNORMAL LOW (ref 3.5–5.0)
Alkaline Phosphatase: 73 U/L (ref 38–126)
Anion gap: 13 (ref 5–15)
BUN: 13 mg/dL (ref 8–23)
CO2: 30 mmol/L (ref 22–32)
Calcium: 6.6 mg/dL — ABNORMAL LOW (ref 8.9–10.3)
Chloride: 100 mmol/L (ref 98–111)
Creatinine, Ser: 0.73 mg/dL (ref 0.44–1.00)
GFR, Estimated: >60 mL/min (ref >60)
Glucose, Bld: 137 mg/dL — ABNORMAL HIGH (ref 70–99)
Potassium: 2.1 mmol/L — CL (ref 3.5–5.1)
Sodium: 142 mmol/L (ref 135–145)
Total Bilirubin: 0.6 mg/dL (ref 0.0–1.2)
Total Protein: 5.3 g/dL — ABNORMAL LOW (ref 6.5–8.1)

## 2024-05-01 LAB — CBG MONITORING, ED
Glucose-Capillary: 96 mg/dL (ref 70–99)
Glucose-Capillary: 153 mg/dL — ABNORMAL HIGH (ref 70–99)

## 2024-05-01 LAB — C DIFFICILE QUICK SCREEN W PCR REFLEX
C Diff antigen: NEGATIVE
C Diff interpretation: NOT DETECTED
C Diff toxin: NEGATIVE

## 2024-05-01 LAB — GASTROINTESTINAL PANEL BY PCR, STOOL (REPLACES STOOL CULTURE)

## 2024-05-01 LAB — BASIC METABOLIC PANEL WITH GFR
Anion gap: 15 (ref 5–15)
Anion gap: 12 (ref 5–15)
BUN: 14 mg/dL (ref 8–23)
BUN: 11 mg/dL (ref 8–23)
CO2: 32 mmol/L (ref 22–32)
CO2: 29 mmol/L (ref 22–32)
Calcium: 6.2 mg/dL — CL (ref 8.9–10.3)
Calcium: 6.6 mg/dL — ABNORMAL LOW (ref 8.9–10.3)
Chloride: 98 mmol/L (ref 98–111)
Chloride: 100 mmol/L (ref 98–111)
Creatinine, Ser: 0.78 mg/dL (ref 0.44–1.00)
Creatinine, Ser: 0.76 mg/dL (ref 0.44–1.00)
GFR, Estimated: >60 mL/min (ref >60)
GFR, Estimated: >60 mL/min (ref >60)
Glucose, Bld: 130 mg/dL — ABNORMAL HIGH (ref 70–99)
Glucose, Bld: 184 mg/dL — ABNORMAL HIGH (ref 70–99)
Potassium: <2 mmol/L — CL (ref 3.5–5.1)
Potassium: 2.6 mmol/L — CL (ref 3.5–5.1)
Sodium: 145 mmol/L (ref 135–145)
Sodium: 141 mmol/L (ref 135–145)

## 2024-05-01 LAB — CBC
HCT: 31 % — ABNORMAL LOW (ref 36.0–46.0)
Hemoglobin: 10.4 g/dL — ABNORMAL LOW (ref 12.0–15.0)
MCH: 28 pg (ref 26.0–34.0)
MCHC: 33.5 g/dL (ref 30.0–36.0)
MCV: 83.3 fL (ref 80.0–100.0)
Platelets: 258 K/uL (ref 150–400)
RBC: 3.72 MIL/uL — ABNORMAL LOW (ref 3.87–5.11)
RDW: 19.7 % — ABNORMAL HIGH (ref 11.5–15.5)
WBC: 9.3 K/uL (ref 4.0–10.5)
nRBC: 0 % (ref 0.0–0.2)

## 2024-05-01 LAB — PHOSPHORUS: Phosphorus: 3.3 mg/dL (ref 2.5–4.6)

## 2024-05-01 LAB — GLUCOSE, CAPILLARY
Glucose-Capillary: 177 mg/dL — ABNORMAL HIGH (ref 70–99)
Glucose-Capillary: 229 mg/dL — ABNORMAL HIGH (ref 70–99)

## 2024-05-01 LAB — PROTIME-INR
INR: 1.2 (ref 0.8–1.2)
Prothrombin Time: 15.8 s — ABNORMAL HIGH (ref 11.4–15.2)

## 2024-05-01 LAB — MAGNESIUM: Magnesium: 0.8 mg/dL — CL (ref 1.7–2.4)

## 2024-05-01 MED ORDER — CHOLESTYRAMINE LIGHT 4 G PO PACK
4.0000 g | PACK | Freq: Two times a day (BID) | ORAL | Status: DC
  Filled 2024-05-01: qty 1

## 2024-05-01 MED ORDER — POLYVINYL ALCOHOL 1.4 % OP SOLN: 2.0000 [drp] | OPHTHALMIC | Status: DC | PRN

## 2024-05-01 MED ORDER — POTASSIUM CHLORIDE CRYS ER 20 MEQ PO TBCR
40.0000 meq | EXTENDED_RELEASE_TABLET | Freq: Once | ORAL | Status: AC
  Administered 2024-05-01: 40 meq via ORAL
  Filled 2024-05-01: qty 2

## 2024-05-01 MED ORDER — PANCRELIPASE (LIP-PROT-AMYL) 12000-38000 UNITS PO CPEP
72000.0000 [IU] | ORAL_CAPSULE | Freq: Three times a day (TID) | ORAL | Status: DC
  Administered 2024-05-01 – 2024-05-07 (×14): 72000 [IU] via ORAL
  Filled 2024-05-01 (×2): qty 6
  Filled 2024-05-01: qty 2
  Filled 2024-05-01 (×10): qty 6
  Filled 2024-05-01: qty 2
  Filled 2024-05-01 (×2): qty 6

## 2024-05-01 MED ORDER — CITALOPRAM HYDROBROMIDE 20 MG PO TABS
10.0000 mg | ORAL_TABLET | Freq: Every day | ORAL | Status: DC
  Administered 2024-05-01 – 2024-05-07 (×6): 10 mg via ORAL
  Filled 2024-05-01 (×7): qty 1

## 2024-05-01 MED ORDER — DORZOLAMIDE HCL-TIMOLOL MAL 2-0.5 % OP SOLN
1.0000 [drp] | Freq: Two times a day (BID) | OPHTHALMIC | Status: DC
  Administered 2024-05-01: 1 [drp] via OPHTHALMIC
  Filled 2024-05-01: qty 10

## 2024-05-01 MED ORDER — ATROPINE SULFATE 1 % OP SOLN
1.0000 [drp] | Freq: Three times a day (TID) | OPHTHALMIC | Status: DC
  Filled 2024-05-01: qty 2

## 2024-05-01 MED ORDER — POTASSIUM CHLORIDE 10 MEQ/100ML IV SOLN
10.0000 meq | INTRAVENOUS | Status: AC
  Administered 2024-05-01 (×4): 10 meq via INTRAVENOUS
  Filled 2024-05-01 (×3): qty 100

## 2024-05-01 MED ORDER — MAGNESIUM SULFATE 2 GM/50ML IV SOLN
2.0000 g | Freq: Once | INTRAVENOUS | Status: AC
  Administered 2024-05-01: 2 g via INTRAVENOUS
  Filled 2024-05-01: qty 50

## 2024-05-01 MED ORDER — ADULT MULTIVITAMIN W/MINERALS CH
1.0000 | ORAL_TABLET | Freq: Every day | ORAL | Status: DC
  Administered 2024-05-03 – 2024-05-06 (×4): 1 via ORAL
  Filled 2024-05-01 (×6): qty 1

## 2024-05-01 MED ORDER — METOPROLOL SUCCINATE ER 25 MG PO TB24
12.5000 mg | ORAL_TABLET | Freq: Every day | ORAL | Status: AC
  Administered 2024-05-03 – 2024-05-07 (×5): 12.5 mg via ORAL
  Filled 2024-05-01 (×6): qty 1

## 2024-05-01 MED ORDER — CALCIUM GLUCONATE-NACL 1-0.675 GM/50ML-% IV SOLN: 1.0000 g | Freq: Once | INTRAVENOUS | Status: DC

## 2024-05-01 MED ORDER — POTASSIUM CHLORIDE CRYS ER 20 MEQ PO TBCR
40.0000 meq | EXTENDED_RELEASE_TABLET | Freq: Three times a day (TID) | ORAL | Status: DC
  Administered 2024-05-01 – 2024-05-04 (×10): 40 meq via ORAL
  Filled 2024-05-01 (×11): qty 2

## 2024-05-01 MED ORDER — VITAMIN B-12 1000 MCG PO TABS
1000.0000 ug | ORAL_TABLET | Freq: Every day | ORAL | Status: DC
  Administered 2024-05-03 – 2024-05-07 (×5): 1000 ug via ORAL
  Filled 2024-05-01 (×6): qty 1

## 2024-05-01 MED ORDER — POTASSIUM CHLORIDE 10 MEQ/100ML IV SOLN
10.0000 meq | INTRAVENOUS | Status: AC
  Administered 2024-05-01 (×6): 10 meq via INTRAVENOUS
  Filled 2024-05-01 (×6): qty 100

## 2024-05-01 MED ORDER — ATROPINE SULFATE 1 % OP SOLN
1.0000 [drp] | Freq: Three times a day (TID) | OPHTHALMIC | Status: DC
  Administered 2024-05-01 – 2024-05-07 (×14): 1 [drp] via OPHTHALMIC
  Filled 2024-05-01 (×2): qty 2

## 2024-05-01 MED ORDER — CALCIUM GLUCONATE-NACL 2-0.675 GM/100ML-% IV SOLN
2.0000 g | Freq: Once | INTRAVENOUS | Status: AC
  Administered 2024-05-01: 2000 mg via INTRAVENOUS
  Filled 2024-05-01: qty 100

## 2024-05-01 MED ORDER — MAGNESIUM SULFATE 4 GM/100ML IV SOLN
4.0000 g | Freq: Once | INTRAVENOUS | Status: AC
  Administered 2024-05-01: 4 g via INTRAVENOUS
  Filled 2024-05-01: qty 100

## 2024-05-01 MED ORDER — BRIMONIDINE TARTRATE 0.2 % OP SOLN
1.0000 [drp] | Freq: Two times a day (BID) | OPHTHALMIC | Status: DC
  Administered 2024-05-01: 1 [drp] via OPHTHALMIC
  Filled 2024-05-01: qty 5

## 2024-05-01 MED ORDER — PREDNISOLONE ACETATE 1 % OP SUSP
1.0000 [drp] | Freq: Four times a day (QID) | OPHTHALMIC | Status: DC
  Administered 2024-05-01: 1 [drp] via OPHTHALMIC
  Filled 2024-05-01: qty 5

## 2024-05-01 NOTE — Assessment & Plan Note (Signed)
 On Crestor 

## 2024-05-01 NOTE — ED Notes (Signed)
 Patient requesting RN to come back later to give medications. States she will call when she is ready that she is trying to rest right now.

## 2024-05-01 NOTE — Assessment & Plan Note (Signed)
 Careful with IV fluids.  No signs of heart failure.  On Toprol  and losartan .

## 2024-05-01 NOTE — Assessment & Plan Note (Signed)
 Could be secondary to overflow constipation.  With electrolyte abnormalities will monitor today.  Could be secondary to pancreatic insufficiency with pancreatic elastase level.  Will give a trial of Creon .  Increased Imodium  dose to 4 mg every 6 hours as needed.

## 2024-05-01 NOTE — Assessment & Plan Note (Signed)
 With low potassium patient complaining of severe weakness especially in the right lower extremity.  Replace potassium.

## 2024-05-01 NOTE — Progress Notes (Signed)
 Progress Note   Patient: Lisa [name] FMW:[id number] DOB: 1945-11-12 DOA: 04/30/2024     1 DOS: the patient was seen and examined on 05/01/2024   Brief hospital course: 78 y.o. female with medical history significant for chronic functional diarrhea, chronic electrolyte imbalance, prior history of DVT not on anticoagulation, HTN, HLD, CHF, DM, depression who was recently at [facility] between 03/14/2024 and 03/29/2024 for abdominal pain.  At that time she underwent EGD which showed a small hiatal hernia, normal stomach, normal examined duodenum.  S/p colonoscopy showed a large amount of stool in the entire colon precluding visualization, biopsy taken, colon preparation was poor.  Pathology showed duodenal mucosa with villous architecture.  Colonic mucosa was unremarkable and was negative for microscopic colitis, active inflammation and chronic changes.  If biopsies are known diagnostic, patient will need another colonoscopy outpatient with better preparation as per GI.  Patient did not follow through with colonoscopy nor fecal calprotectin study 06/2022.  Patient was started on scheduled Imodium  per GI recommendation that time.   Today patient came into the hospital complaining of lysed weakness at home to the point that she had to use walker.  She says she has been having usual diarrhea no problem.  She has no problem eating.  She denies any other symptoms.   ED Course: Upon arrival to the ED, patient is found to be severely low potassium at less than 2.0, low calcium  at 6.4, high anion gap at 17, bicarb was 32.  White count was normal.  Patient denied any other symptoms.  Patient was replaced with potassium and calcium  and hospital service was consulted for evaluation for admission for generalized weakness and electrolyte abnormalities.  12/7.  Patient states that she has had diarrhea since August.  Coming in with electrolyte abnormalities and diarrhea.  Case discussed with gastroenterology and may have  an overflow constipation and pancreatic insufficiency will try cholestyramine .  Assessment and Plan: * Hypomagnesemia 4 g IV magnesium  this morning and 2 g IV in the afternoon.  Magnesium  0.8 today  Hypokalemia Severe hypokalemia with potassium of 2.1 today was less than 2 yesterday.  Continue aggressive potassium replacement orally and IV.  Replace magnesium  first.  Potassium-sensitive periodic paralysis With low potassium patient complaining of severe weakness especially in the right lower extremity.  Replace potassium.  Chronic diarrhea of unknown origin Could be secondary to overflow constipation.  With electrolyte abnormalities will monitor today.  Could be secondary to pancreatic insufficiency with pancreatic elastase level.  Will give a trial of Creon .  Case discussed with gastroenterology.  Sinus bradycardia Will lower dose of Toprol -XL.  Hyperlipidemia, unspecified On Crestor   Chronic heart failure with preserved ejection fraction (HFpEF) (HCC) Careful with IV fluids.  No signs of heart failure currently.  On Toprol  and losartan .  Likely will add Aldactone  tomorrow  Mediastinal mass Seen on imaging back in 2024 with benign appearance.  Follow-up as outpatient  Uncontrolled type 2 diabetes mellitus with hypoglycemia, without long-term current use of insulin  (HCC) Patient on D10 drip.  Continue fingersticks.  Discontinue sliding scale since last hemoglobin A1c 6.4  Tobacco abuse Stop smoking  Benign essential HTN On Toprol  and losartan         Subjective: Patient complains of weakness with her lower extremities.  Admitted with diarrhea going on since August.  Physical Exam: Vitals:   05/01/24 0530 05/01/24 0815 05/01/24 0830 05/01/24 1139  BP: (!) 156/73  (!) 155/70 (!) 166/85  Pulse: (!) 56 60 68 63  Resp: ROLLEN)  22 17 18    Temp:   98.7 F (37.1 C)   TempSrc:   Oral   SpO2: 100%  99%   Weight:      Height:       Physical Exam HENT:     Head:  Normocephalic.  Eyes:     General: Lids are normal.     Conjunctiva/sclera: Conjunctivae normal.  Cardiovascular:     Rate and Rhythm: Regular rhythm. Bradycardia present.     Heart sounds: Normal heart sounds, S1 normal and S2 normal.  Pulmonary:     Breath sounds: No decreased breath sounds, wheezing, rhonchi or rales.  Abdominal:     Palpations: Abdomen is soft.     Tenderness: There is no abdominal tenderness.  Musculoskeletal:     Right lower leg: No swelling.     Left lower leg: No swelling.  Skin:    General: Skin is warm.     Findings: No rash.  Neurological:     Mental Status: She is alert and oriented to person, place, and time.     Comments: Difficulty with straight leg raise with right leg.  Able to do it with left leg little easier.     Data Reviewed: Hemoglobin A1c 6.4, potassium 2.1, glucose 137, creatinine 0.73, calcium  6.6, magnesium  0.8, white blood cell count 9.3, hemoglobin 10.4, platelet count 258, PTH slightly elevated 71  Family Communication: Updated brother on the phone  Disposition: Status is: Inpatient Remains inpatient appropriate because: Aggressive replacement of magnesium  and potassium.  Try to figure out cause of her diarrhea.  Stool studies ordered  Planned Discharge Destination: Home    Time spent: 28 minutes  Author: Charlie Patterson, MD 05/01/2024 12:07 PM  For on call review [email or web address].

## 2024-05-01 NOTE — Assessment & Plan Note (Signed)
On Toprol XL 

## 2024-05-01 NOTE — Assessment & Plan Note (Signed)
 Severe hypokalemia with potassium of 2.1 today was less than 2 yesterday.  Continue aggressive potassium replacement orally and IV.  Replace magnesium  first.

## 2024-05-01 NOTE — ED Notes (Signed)
 Patient had asked for acetaminophen  when attempting to give refused. Attempted to place second IV for AM labs patient refused. Offered rectal tube for mult liq stools with patient refusal. Patient stated we are over medicating her, and she worried she would have the rectal tube forever. Educated on first IV line in use and unable to pause for labs, comfort measure of medication/rectal tube. Patient continues to refuse. Will attempt later. Patient only wanted graham crackers for care.

## 2024-05-01 NOTE — Assessment & Plan Note (Signed)
 On Toprol  and losartan 

## 2024-05-01 NOTE — Assessment & Plan Note (Signed)
 Seen on imaging back in 2024 with benign appearance.  Follow-up as outpatient

## 2024-05-01 NOTE — Progress Notes (Signed)
 PHARMACY CONSULT NOTE - FOLLOW UP  Pharmacy Consult for Electrolyte Monitoring and Replacement   Recent Labs: Potassium (mmol/L)  Date Value  05/01/2024 2.6 (LL)   Magnesium  (mg/dL)  Date Value  87/92/7974 0.8 (LL)   Calcium  (mg/dL)  Date Value  87/92/7974 6.6 (L)   Albumin (g/dL)  Date Value  87/92/7974 2.9 (L)  09/25/2015 4.4   Phosphorus (mg/dL)  Date Value  87/92/7974 3.3   Sodium (mmol/L)  Date Value  05/01/2024 141  09/25/2015 142   Assessment: 12/7 @ 0026:  K = < 2.0                         Ca = 6.2,  Alb = 3.4 ,  Corrected Ca = 6.68  78 yo F with PMH including chronic diarrhea and electrolyte imbalances presenting with abdominal pain.  Goal of Therapy:  Electrolytes WNL   Plan: K 2.6 >> provider ordered potassium chloride  x 4 doses. Also has active order for 40mEq PO TID Mg 0.8 >> receiving total of 6 g so far today Check electrolytes with AM labs  Will M. Lenon, PharmD, BCPS Clinical Pharmacist 05/01/2024 5:22 PM

## 2024-05-01 NOTE — Consult Note (Signed)
 Inpatient Consultation   [name] is a 78 y.o. female.  Requesting Provider: Charlie Patterson, MD  Date of Admission: 04/30/2024  Date of Consult: 05/01/24   Reason for Consultation: diarrhea   Patient's Chief Complaint:   Chief Complaint  Patient presents with   Weakness   Hypoglycemia    78 year old African-American female with CHF, depression, DM2, HFpEF, chronic diarrhea who presents to the hospital with weakness fatigue and persistent diarrhea.  GI is consulted for persistent diarrhea.  She was recently seen in the hospital for similar issue approximate 1 month ago where she underwent upper endoscopy and colonoscopy.  Poor prep colonoscopy but biopsies were negative for microscopic colitis.  Was found to have pancreatic insufficiency based on lab. She is continue to have upwards of 7 bowel movements a day.  She reports fecal incontinence as well as loss of urine as well.  No melena or hematochezia.  This has greatly impacted her daily living.  She has even noted nocturnal awakenings 2-3 times per night  She does frequently eat fatty/greasy meals.  Has not noticed any change with dairy.  Reports not much of an appetite.  No abdominal pain or upper GI symptoms.  Denies NSAIDs, Anti-plt agents, and anticoagulants Denies family history of gastrointestinal disease and malignancy Previous Endoscopies: 03/24/24 - EGD and CSY Small hiatal hernia, otherwise unremarkable upper endoscopy Colonoscopy with poor prep.  Only advance to descending colon.  Biopsies negative for microscopic colitis    Past Medical History:  Diagnosis Date   Blindness of right eye    CHF (congestive heart failure) (HCC)    Depression    Diabetes mellitus without complication (HCC)    DVT of leg (deep venous thrombosis) (HCC) 2011   Glaucoma    Grade II diastolic dysfunction    Hyperlipidemia    Hypertension    Vascular disease     Past Surgical History:  Procedure  Laterality Date   ABDOMINAL HYSTERECTOMY  1992   Total   CATARACT EXTRACTION W/PHACO Left 02/25/2023   Procedure: CATARACT EXTRACTION PHACO AND INTRAOCULAR LENS PLACEMENT (IOC) LEFT DIABETIC  malyugin;  Surgeon: Mittie Gaskin, MD;  Location: [facility];  Service: Ophthalmology;  Laterality: Left;  10.78 0:49.1   COLONOSCOPY N/A 03/24/2024   Procedure: COLONOSCOPY;  Surgeon: Jinny Carmine, MD;  Location: [facility];  Service: Endoscopy;  Laterality: N/A;   ESOPHAGOGASTRODUODENOSCOPY N/A 03/24/2024   Procedure: EGD (ESOPHAGOGASTRODUODENOSCOPY);  Surgeon: Jinny Carmine, MD;  Location: [facility] ENDOSCOPY;  Service: Endoscopy;  Laterality: N/A;   EYE SURGERY     LOWER EXTREMITY ANGIOGRAPHY Left 09/03/2023   Procedure: Lower Extremity Angiography;  Surgeon: Marea Selinda RAMAN, MD;  Location: [facility] IN[facility];  Service: Cardiovascular;  Laterality: Left;   LOWER EXTREMITY ANGIOGRAPHY Left 11/05/2023   Procedure: Lower Extremity Angiography;  Surgeon: Marea Selinda RAMAN, MD;  Location: [facility];  Service: Cardiovascular;  Laterality: Left;   LOWER EXTREMITY INTERVENTION Left 09/03/2023   Procedure: LOWER EXTREMITY INTERVENTION;  Surgeon: Marea Selinda RAMAN, MD;  Location: [facility];  Service: Cardiovascular;  Laterality: Left;   LOWER EXTREMITY INTERVENTION Left 11/05/2023   Procedure: LOWER EXTREMITY INTERVENTION;  Surgeon: Marea Selinda RAMAN, MD;  Location: [facility];  Service: Cardiovascular;  Laterality: Left;   TOE AMPUTATION Left 2011   All of her toes on the left foot   VASCULAR SURGERY      Allergies  Allergen Reactions   Other Itching  Nucinta - Hallucinations   Tapentadol Hcl Itching    Hallucinations   Actos  [Pioglitazone ]     History of CHF   Penicillins Rash    Family History  Problem Relation Age of Onset   Heart disease Father    Diabetes Mother    Diabetes Brother    Kidney disease Brother        Transplant   Heart disease Brother     Diabetes Sister    Diabetes Sister     Social History   Tobacco Use   Smoking status: Some Days    Current packs/day: 0.25    Average packs/day: 0.2 packs/day for 41.2 years (10.3 ttl pk-yrs)    Types: Cigarettes    Start date: 03/16/2000   Smokeless tobacco: Never  Vaping Use   Vaping status: Never Used  Substance Use Topics   Alcohol  use: Yes    Comment: Wine Ocassionally   Drug use: No     Pertinent GI related history and allergies were reviewed with the patient  Review of Systems  Constitutional:  Positive for activity change, appetite change, fatigue and unexpected weight change. Negative for chills, diaphoresis and fever.  HENT:  Negative for trouble swallowing and voice change.   Respiratory:  Negative for shortness of breath and wheezing.   Cardiovascular:  Negative for chest pain, palpitations and leg swelling.  Gastrointestinal:  Positive for diarrhea. Negative for abdominal distention, abdominal pain, anal bleeding, blood in stool, constipation, nausea, rectal pain and vomiting.  Skin:  Negative for color change and pallor.  Neurological:  Positive for weakness. Negative for dizziness and syncope.  Psychiatric/Behavioral:  Negative for agitation and confusion.   All other systems reviewed and are negative.    Medications Home Medications No current facility-administered medications on file prior to encounter.   Current Outpatient Medications on File Prior to Encounter  Medication Sig Dispense Refill   acetaminophen  (TYLENOL ) 500 MG tablet Take 2 tablets by mouth every 6 (six) hours as needed.     amLODipine  (NORVASC ) 10 MG tablet Take 1 tablet (10 mg total) by mouth daily. 90 tablet 1   aspirin  81 MG tablet Take 81 mg by mouth daily.     atropine  1 % ophthalmic solution Place 1 drop into the left eye 3 (three) times daily.     brimonidine  (ALPHAGAN ) 0.2 % ophthalmic solution Place 1 drop into both eyes 2 (two) times daily.  3   citalopram  (CELEXA ) 10 MG tablet  Take 1 tablet (10 mg total) by mouth daily. 90 tablet 1   clopidogrel  (PLAVIX ) 75 MG tablet Take 1 tablet (75 mg total) by mouth daily. 30 tablet 11   cyanocobalamin  1000 MCG tablet Take 1,000 mcg by mouth daily.     dapagliflozin  propanediol (FARXIGA ) 10 MG TABS tablet Take 1 tablet (10 mg total) by mouth daily. TAKE 1 TABLET BY MOUTH EVERY DAY BEFORE BREAKFAST 90 tablet 1   dorzolamide -timolol  (COSOPT ) 22.3-6.8 MG/ML ophthalmic solution Place 1 drop into both eyes 2 (two) times daily.     losartan  (COZAAR ) 100 MG tablet Take 1 tablet (100 mg total) by mouth daily. 90 tablet 1   metoprolol  succinate (TOPROL  XL) 25 MG 24 hr tablet Take 1 tablet (25 mg total) by mouth daily. Take with or immediately following a meal. 90 tablet 1   Multiple Vitamin (MULTIVITAMIN) tablet Take 1 tablet by mouth daily.     nitroGLYCERIN  (NITROSTAT ) 0.4 MG SL tablet Place 1 tablet (0.4 mg total) under the  tongue every 5 (five) minutes as needed for chest pain. 15 tablet 12   polyvinyl alcohol  (LIQUIFILM TEARS) 1.4 % ophthalmic solution Place 2 drops into the right eye as needed for dry eyes.     potassium chloride  SA (KLOR-CON  M) 20 MEQ tablet Take 1 tablet (20 mEq total) by mouth daily. 3 tablet 0   prednisoLONE  acetate (PRED FORTE ) 1 % ophthalmic suspension Place 1 drop into the left eye 4 (four) times daily.     rosuvastatin  (CRESTOR ) 10 MG tablet Take 1 tablet (10 mg total) by mouth daily. 90 tablet 1   traMADol  (ULTRAM ) 50 MG tablet Take 1 tablet (50 mg total) by mouth every 6 (six) hours as needed. 20 tablet 0   Vitamin D , Ergocalciferol , (DRISDOL ) 1.25 MG (50000 UNIT) CAPS capsule Take 1 capsule (50,000 Units total) by mouth every 7 (seven) days. 8 capsule 1   Cyanocobalamin  (B-12) 1000 MCG SUBL Place 1 tablet under the tongue daily. (Patient not taking: Reported on 04/30/2024) 90 tablet 1   feeding supplement (ENSURE ENLIVE / ENSURE PLUS) LIQD Take 237 mLs by mouth 2 (two) times daily between meals.     gabapentin   (NEURONTIN ) 100 MG capsule Take 1 capsule (100 mg total) by mouth 3 (three) times daily. (Patient taking differently: Take 100 mg by mouth 3 (three) times daily as needed.) 90 capsule 2   Pertinent GI related medications were reviewed with the patient  Inpatient Medications  Current Facility-Administered Medications:    0.9 %  sodium chloride  infusion, , Intravenous, PRN, Bradler, Evan K, MD, Last Rate: 100 mL/hr at 05/01/24 0145, Restarted at 05/01/24 0145   acetaminophen  (TYLENOL ) tablet 650 mg, 650 mg, Oral, Q6H PRN **OR** acetaminophen  (TYLENOL ) suppository 650 mg, 650 mg, Rectal, Q6H PRN, Paudel, Keshab, MD   aspirin  EC tablet 81 mg, 81 mg, Oral, Daily, Paudel, Keshab, MD   cholestyramine  light (PREVALITE ) packet 4 g, 4 g, Oral, BID, Wieting, Richard, MD   clopidogrel  (PLAVIX ) tablet 75 mg, 75 mg, Oral, Daily, Paudel, Keshab, MD, 75 mg at 04/30/24 2254   dextrose  10 % infusion, , Intravenous, Continuous, Wieting, Richard, MD   dextrose  50 % solution 50 mL, 1 ampule, Intravenous, Once, Bradler, Evan K, MD   enoxaparin  (LOVENOX ) injection 40 mg, 40 mg, Subcutaneous, Q24H, Paudel, Keshab, MD, 40 mg at 04/30/24 2253   gabapentin  (NEURONTIN ) capsule 100 mg, 100 mg, Oral, TID, Paudel, Keshab, MD   hydrALAZINE  (APRESOLINE ) injection 10 mg, 10 mg, Intravenous, Q6H PRN, Paudel, Keshab, MD   insulin  aspart (novoLOG ) injection 0-5 Units, 0-5 Units, Subcutaneous, QHS, Paudel, Keshab, MD   insulin  aspart (novoLOG ) injection 0-9 Units, 0-9 Units, Subcutaneous, TID WC, Paudel, Keshab, MD   lipase/protease/amylase (CREON ) capsule 72,000 Units, 72,000 Units, Oral, TID AC, Wieting, Richard, MD   loperamide  (IMODIUM ) capsule 2 mg, 2 mg, Oral, BID PRN, Paudel, Keshab, MD, 2 mg at 04/30/24 2328   losartan  (COZAAR ) tablet 100 mg, 100 mg, Oral, Daily, Paudel, Keshab, MD, 100 mg at 04/30/24 2114   metoprolol  succinate (TOPROL -XL) 24 hr tablet 25 mg, 25 mg, Oral, Daily, Paudel, Keshab, MD, 25 mg at 04/30/24 2254    oxyCODONE  (Oxy IR/ROXICODONE ) immediate release tablet 5 mg, 5 mg, Oral, Q4H PRN, Paudel, Keshab, MD   polyethylene glycol (MIRALAX  / GLYCOLAX ) packet 17 g, 17 g, Oral, Daily PRN, Paudel, Keshab, MD   potassium chloride  10 mEq in 100 mL IVPB, 10 mEq, Intravenous, Q1 Hr x 6, Paudel, Keshab, MD, Last Rate: 100 mL/hr at 05/01/24  1124, 10 mEq at 05/01/24 1124   potassium chloride  SA (KLOR-CON  M) CR tablet 40 mEq, 40 mEq, Oral, TID, Wieting, Richard, MD, 40 mEq at 05/01/24 0818   rosuvastatin  (CRESTOR ) tablet 10 mg, 10 mg, Oral, Daily, Paudel, Keshab, MD, 10 mg at 04/30/24 2254  Current Outpatient Medications:    acetaminophen  (TYLENOL ) 500 MG tablet, Take 2 tablets by mouth every 6 (six) hours as needed., Disp: , Rfl:    amLODipine  (NORVASC ) 10 MG tablet, Take 1 tablet (10 mg total) by mouth daily., Disp: 90 tablet, Rfl: 1   aspirin  81 MG tablet, Take 81 mg by mouth daily., Disp: , Rfl:    atropine  1 % ophthalmic solution, Place 1 drop into the left eye 3 (three) times daily., Disp: , Rfl:    brimonidine  (ALPHAGAN ) 0.2 % ophthalmic solution, Place 1 drop into both eyes 2 (two) times daily., Disp: , Rfl: 3   citalopram  (CELEXA ) 10 MG tablet, Take 1 tablet (10 mg total) by mouth daily., Disp: 90 tablet, Rfl: 1   clopidogrel  (PLAVIX ) 75 MG tablet, Take 1 tablet (75 mg total) by mouth daily., Disp: 30 tablet, Rfl: 11   cyanocobalamin  1000 MCG tablet, Take 1,000 mcg by mouth daily., Disp: , Rfl:    dapagliflozin  propanediol (FARXIGA ) 10 MG TABS tablet, Take 1 tablet (10 mg total) by mouth daily. TAKE 1 TABLET BY MOUTH EVERY DAY BEFORE BREAKFAST, Disp: 90 tablet, Rfl: 1   dorzolamide -timolol  (COSOPT ) 22.3-6.8 MG/ML ophthalmic solution, Place 1 drop into both eyes 2 (two) times daily., Disp: , Rfl:    losartan  (COZAAR ) 100 MG tablet, Take 1 tablet (100 mg total) by mouth daily., Disp: 90 tablet, Rfl: 1   metoprolol  succinate (TOPROL  XL) 25 MG 24 hr tablet, Take 1 tablet (25 mg total) by mouth daily. Take  with or immediately following a meal., Disp: 90 tablet, Rfl: 1   Multiple Vitamin (MULTIVITAMIN) tablet, Take 1 tablet by mouth daily., Disp: , Rfl:    nitroGLYCERIN  (NITROSTAT ) 0.4 MG SL tablet, Place 1 tablet (0.4 mg total) under the tongue every 5 (five) minutes as needed for chest pain., Disp: 15 tablet, Rfl: 12   polyvinyl alcohol  (LIQUIFILM TEARS) 1.4 % ophthalmic solution, Place 2 drops into the right eye as needed for dry eyes., Disp: , Rfl:    potassium chloride  SA (KLOR-CON  M) 20 MEQ tablet, Take 1 tablet (20 mEq total) by mouth daily., Disp: 3 tablet, Rfl: 0   prednisoLONE  acetate (PRED FORTE ) 1 % ophthalmic suspension, Place 1 drop into the left eye 4 (four) times daily., Disp: , Rfl:    rosuvastatin  (CRESTOR ) 10 MG tablet, Take 1 tablet (10 mg total) by mouth daily., Disp: 90 tablet, Rfl: 1   traMADol  (ULTRAM ) 50 MG tablet, Take 1 tablet (50 mg total) by mouth every 6 (six) hours as needed., Disp: 20 tablet, Rfl: 0   Vitamin D , Ergocalciferol , (DRISDOL ) 1.25 MG (50000 UNIT) CAPS capsule, Take 1 capsule (50,000 Units total) by mouth every 7 (seven) days., Disp: 8 capsule, Rfl: 1   Cyanocobalamin  (B-12) 1000 MCG SUBL, Place 1 tablet under the tongue daily. (Patient not taking: Reported on 04/30/2024), Disp: 90 tablet, Rfl: 1   feeding supplement (ENSURE ENLIVE / ENSURE PLUS) LIQD, Take 237 mLs by mouth 2 (two) times daily between meals., Disp: , Rfl:    gabapentin  (NEURONTIN ) 100 MG capsule, Take 1 capsule (100 mg total) by mouth 3 (three) times daily. (Patient taking differently: Take 100 mg by mouth 3 (three) times  daily as needed.), Disp: 90 capsule, Rfl: 2  sodium chloride  100 mL/hr at 05/01/24 0145   dextrose      potassium chloride  10 mEq (05/01/24 1124)    sodium chloride , acetaminophen  **OR** acetaminophen , hydrALAZINE , loperamide , oxyCODONE , polyethylene glycol   Objective   Vitals:   05/01/24 0430 05/01/24 0530 05/01/24 0815 05/01/24 0830  BP: (!) 155/63 (!) 156/73  (!)  155/70  Pulse: (!) 57 (!) 56 60 68  Resp: 14 (!) 22 17 18   Temp:    98.7 F (37.1 C)  TempSrc:    Oral  SpO2:  100%  99%  Weight:      Height:         Physical Exam Vitals and nursing note reviewed.  Constitutional:      General: She is not in acute distress.    Appearance: She is ill-appearing (chronically). She is not toxic-appearing or diaphoretic.     Comments: Thin and frail appearing  HENT:     Head: Normocephalic and atraumatic.     Nose: Nose normal.     Mouth/Throat:     Mouth: Mucous membranes are moist.     Pharynx: Oropharynx is clear.  Eyes:     General: No scleral icterus. Cardiovascular:     Rate and Rhythm: Normal rate and regular rhythm.  Pulmonary:     Effort: Pulmonary effort is normal. No respiratory distress.     Breath sounds: Normal breath sounds. No wheezing, rhonchi or rales.  Abdominal:     General: Abdomen is flat. There is no distension.     Palpations: Abdomen is soft.     Tenderness: There is no abdominal tenderness. There is no guarding or rebound.     Comments: Hypoactive bowel sounds  Musculoskeletal:     Cervical back: Neck supple.  Skin:    General: Skin is warm and dry.     Coloration: Skin is not jaundiced or pale.  Neurological:     General: No focal deficit present.     Mental Status: She is alert and oriented to person, place, and time. Mental status is at baseline.  Psychiatric:        Mood and Affect: Mood normal.        Behavior: Behavior normal.        Thought Content: Thought content normal.        Judgment: Judgment normal.     Laboratory Data Recent Labs  Lab 04/30/24 1503 05/01/24 0612  WBC 10.5 9.3  HGB 12.1 10.4*  HCT 37.7 31.0*  PLT 278 258   Recent Labs  Lab 04/30/24 1503 04/30/24 1605 05/01/24 0026 05/01/24 0612  NA 143 144 145 142  K <2.0* <2.0* <2.0* 2.1*  CL 97* 95* 98 100  CO2 33* 32 32 30  BUN 17 16 14 13   CALCIUM  6.2* 6.4* 6.2* 6.6*  PROT 6.2*  --   --  5.3*  BILITOT 0.7  --   --  0.6   ALKPHOS 91  --   --  73  ALT 20  --   --  17  AST 43*  --   --  38  GLUCOSE 325* 294* 130* 137*   Recent Labs  Lab 05/01/24 0612  INR 1.2    No results for input(s): LIPASE in the last 72 hours.      Imaging Studies: DG Abd 1 View Result Date: 05/01/2024 EXAM: 1 VIEW XRAY OF THE ABDOMEN 05/01/2024 09:08:25 AM COMPARISON: None available. CLINICAL HISTORY:  [id number] Encopresis [id number] Encopresis. FINDINGS: BOWEL: Mild diffuse gaseous distention of the bowel. Fecal material within the right colon. SOFT TISSUES: Moderate vascular calcifications. BONES: No acute fracture. HEART: The heart is enlarged. IMPRESSION: 1. Mild diffuse gaseous distention of the bowel and fecal material within the right colon, consistent with encopresis. 2. Enlarged heart. 3. Moderate vascular calcifications. Electronically signed by: Evalene Coho MD 05/01/2024 09:13 AM EST RP Workstation: [id number]    Assessment:   # Chronic diarrhea - Pancreatic elastase low on last hospitalization in October - Infectious workup previous negative - Recent microscopic colitis biopsies negative - Gastrin levels within normal limits - Fecal calprotectin normal  # Multiple severe electrolyte derangements including hypokalemia hypomagnesemia hyponatremia hypochloremia hypocalcemia - Secondary to chronic diarrhea  #Generalized weakness and fatigue secondary to above  #Chronic anemia-normocytic  #DM2  #Suspected pancreatic insufficiency  #Depression  #HFpEF  Plan:   Given pancreatic insufficiency based on pancreatic elastase we will start Creon  Start with cholestyramine  twice a day Imodium  as needed Collect chromogranin A, vasoactive peptide, stool osmolality and stool electrolytes repeat infectious stool studies have been ordered Repeat infectious stool studies have been ordered Further workup pending these results KUB to rule out encopresis- if encopresis present- would hold imodium  and bile acid  sequestrant and focus on bowel cleanse- ie miralax  and gatorade prep once electrolyte derangements resolve  While actively working on diagnostic workup aside from pancreatic insufficiency and treatment- we will treat diarrhea symptomatically  Scheduled for outpatient GI follow-up with Dr. Therisa February 2026.  Will attempt potentially moving up  Management of other medical comorbidities as per primary team  I personally performed the service.  Thank you for allowing us  to participate in this patient's care. Please don't hesitate to call if any questions or concerns arise.   Elspeth Ozell Jungling, DO [facility]  Portions of the record may have been created with voice recognition software. Occasional wrong-word or 'sound-a-like' substitutions may have occurred due to the inherent limitations of voice recognition software.  Read the chart carefully and recognize, using context, where substitutions may have occurred.

## 2024-05-01 NOTE — Hospital Course (Signed)
 78 y.o. female with medical history significant for chronic functional diarrhea, chronic electrolyte imbalance, prior history of DVT not on anticoagulation, HTN, HLD, CHF, DM, depression who was recently at [facility] between 03/14/2024 and 03/29/2024 for abdominal pain.  At that time she underwent EGD which showed a small hiatal hernia, normal stomach, normal examined duodenum.  S/p colonoscopy showed a large amount of stool in the entire colon precluding visualization, biopsy taken, colon preparation was poor.  Pathology showed duodenal mucosa with villous architecture.  Colonic mucosa was unremarkable and was negative for microscopic colitis, active inflammation and chronic changes.  If biopsies are known diagnostic, patient will need another colonoscopy outpatient with better preparation as per GI.  Patient did not follow through with colonoscopy nor fecal calprotectin study 06/2022.  Patient was started on scheduled Imodium  per GI recommendation that time.   Today patient came into the hospital complaining of lysed weakness at home to the point that she had to use walker.  She says she has been having usual diarrhea no problem.  She has no problem eating.  She denies any other symptoms.   ED Course: Upon arrival to the ED, patient is found to be severely low potassium at less than 2.0, low calcium  at 6.4, high anion gap at 17, bicarb was 32.  White count was normal.  Patient denied any other symptoms.  Patient was replaced with potassium and calcium  and hospital service was consulted for evaluation for admission for generalized weakness and electrolyte abnormalities.  12/7.  Patient states that she has had diarrhea since August.  Coming in with electrolyte abnormalities and diarrhea.  Case discussed with gastroenterology and may have an overflow constipation and pancreatic insufficiency will try creon . 12/8.  Patient complains of blurry vision today.  She states she is blind in her right eye and has blurry vision  in her left eye.  Patient able to move her legs better today.  Still having some diarrhea. 12/9.  Patient feeling a little bit better.  Blurry vision little less.  Will follow-up with eye doctor upon discharge in a week.  Still having diarrhea.  Still with low potassium 2.9.

## 2024-05-01 NOTE — Progress Notes (Addendum)
 PHARMACY CONSULT NOTE - FOLLOW UP  Pharmacy Consult for Electrolyte Monitoring and Replacement   Recent Labs: Potassium (mmol/L)  Date Value  05/01/2024 <2.0 (LL)   Magnesium  (mg/dL)  Date Value  88/96/7974 1.9   Calcium  (mg/dL)  Date Value  87/92/7974 6.2 (LL)   Albumin (g/dL)  Date Value  87/93/7974 3.4 (L)  09/25/2015 4.4   Phosphorus (mg/dL)  Date Value  88/97/7974 3.0   Sodium (mmol/L)  Date Value  05/01/2024 145  09/25/2015 142     Assessment: 12/7 @ 0026:  K = < 2.0                         Ca = 6.2,  Alb = 3.4 ,  Corrected Ca = 6.68   Goal of Therapy:  Electrolytes WNL   Plan:  - Will order KCl 10 mEq IV X 6 and KCl 40 mEq PO X 1  - Calcium  gluconate 2 gm IVPB X 1  - recheck electrolytes on 12/7 @ 10 am   Karyme Mcconathy D ,PharmD Clinical Pharmacist 05/01/2024 2:14 AM

## 2024-05-01 NOTE — Assessment & Plan Note (Signed)
 Replaced aggressively on presentation..  Magnesium  normal range today.

## 2024-05-01 NOTE — Assessment & Plan Note (Signed)
 Stop smoking

## 2024-05-01 NOTE — ED Notes (Signed)
 Pt given apple juice and graham crackers per pt request

## 2024-05-01 NOTE — Assessment & Plan Note (Signed)
 Replaced IV previously continue oral supplementation.

## 2024-05-01 NOTE — ED Notes (Addendum)
 This RN spoke with pharmacist Selinda to discuss potassium chloride  infusion. Patient currently receiving second bag of K+ 10 mEq from 2030 order; implementation of order delayed d/t previous potassium x 4 order was being completed, then BMP was needed to recheck potassium level. Per pharmacist, after finishing K+ infusion that began at 0244, then initiate 0300 order for q hour x 6.

## 2024-05-01 NOTE — ED Notes (Signed)
 Pt cleaned up after a bowel movement. New brief and bed pad applied. Warm blankets given.

## 2024-05-01 NOTE — ED Notes (Signed)
 Hospitalist paged for abnormal labs

## 2024-05-01 NOTE — ED Notes (Signed)
 Patient given orange juice and grape juice. Requesting to no longer be stuck. Does not want any insulin .

## 2024-05-01 NOTE — ED Notes (Signed)
 Pt stated she did not want the fecal containment device placed at this time

## 2024-05-01 NOTE — Progress Notes (Signed)
 Patient arrives to unit. Oriented to room and call bell. States, that gown and stuff will have to wait, I need to rest. Patient also adamant that eye drops ordered are not correct.  Patient educated regarding the importance of cardiac monitoring d/t her low potassium, defers at this time. Vital signs allowed, see flowsheet.

## 2024-05-01 NOTE — ED Notes (Signed)
 GI at bedside

## 2024-05-01 NOTE — ED Notes (Signed)
 Bedside cleaning done. Patient incontinent of stool. Full linen change and patient repositioned in bed.

## 2024-05-01 NOTE — ED Notes (Signed)
 Pts brief changed after pt had an episode of urinary incontinence

## 2024-05-01 NOTE — Assessment & Plan Note (Signed)
 Patient on D10 drip.  Continue fingersticks.  Discontinue sliding scale since last hemoglobin A1c 6.4

## 2024-05-02 ENCOUNTER — Inpatient Hospital Stay

## 2024-05-02 DIAGNOSIS — E876 Hypokalemia: Secondary | ICD-10-CM | POA: Diagnosis not present

## 2024-05-02 DIAGNOSIS — R0602 Shortness of breath: Secondary | ICD-10-CM

## 2024-05-02 DIAGNOSIS — R509 Fever, unspecified: Secondary | ICD-10-CM | POA: Diagnosis not present

## 2024-05-02 DIAGNOSIS — H538 Other visual disturbances: Secondary | ICD-10-CM | POA: Insufficient documentation

## 2024-05-02 DIAGNOSIS — R531 Weakness: Secondary | ICD-10-CM | POA: Diagnosis not present

## 2024-05-02 LAB — BASIC METABOLIC PANEL WITH GFR
Anion gap: 12 (ref 5–15)
BUN: 11 mg/dL (ref 8–23)
CO2: 28 mmol/L (ref 22–32)
Calcium: 6.2 mg/dL — CL (ref 8.9–10.3)
Chloride: 101 mmol/L (ref 98–111)
Creatinine, Ser: 0.67 mg/dL (ref 0.44–1.00)
GFR, Estimated: >60 mL/min (ref >60)
Glucose, Bld: 147 mg/dL — ABNORMAL HIGH (ref 70–99)
Potassium: 2.9 mmol/L — ABNORMAL LOW (ref 3.5–5.1)
Sodium: 141 mmol/L (ref 135–145)

## 2024-05-02 LAB — GLUCOSE, CAPILLARY
Glucose-Capillary: 112 mg/dL — ABNORMAL HIGH (ref 70–99)
Glucose-Capillary: 100 mg/dL — ABNORMAL HIGH (ref 70–99)
Glucose-Capillary: 178 mg/dL — ABNORMAL HIGH (ref 70–99)
Glucose-Capillary: 143 mg/dL — ABNORMAL HIGH (ref 70–99)

## 2024-05-02 LAB — MAGNESIUM: Magnesium: 2.1 mg/dL (ref 1.7–2.4)

## 2024-05-02 LAB — PHOSPHORUS: Phosphorus: 2.6 mg/dL (ref 2.5–4.6)

## 2024-05-02 LAB — POTASSIUM: Potassium: 2.6 mmol/L — CL (ref 3.5–5.1)

## 2024-05-02 MED ORDER — FUROSEMIDE 10 MG/ML IJ SOLN
40.0000 mg | Freq: Once | INTRAMUSCULAR | Status: AC
  Administered 2024-05-02: 40 mg via INTRAVENOUS
  Filled 2024-05-02: qty 4

## 2024-05-02 MED ORDER — AMLODIPINE BESYLATE 10 MG PO TABS
10.0000 mg | ORAL_TABLET | Freq: Every day | ORAL | Status: DC
  Filled 2024-05-02: qty 1

## 2024-05-02 MED ORDER — LORAZEPAM 2 MG/ML IJ SOLN
0.5000 mg | Freq: Once | INTRAMUSCULAR | Status: AC
  Administered 2024-05-02: 0.5 mg via INTRAVENOUS
  Filled 2024-05-02: qty 1

## 2024-05-02 MED ORDER — DORZOLAMIDE HCL-TIMOLOL MAL 2-0.5 % OP SOLN
1.0000 [drp] | Freq: Two times a day (BID) | OPHTHALMIC | Status: DC
  Administered 2024-05-02 – 2024-05-07 (×10): 1 [drp] via OPHTHALMIC
  Filled 2024-05-02 (×2): qty 10

## 2024-05-02 MED ORDER — POTASSIUM CHLORIDE 10 MEQ/100ML IV SOLN
10.0000 meq | INTRAVENOUS | Status: AC
  Administered 2024-05-02 – 2024-05-03 (×4): 10 meq via INTRAVENOUS
  Filled 2024-05-02 (×4): qty 100

## 2024-05-02 MED ORDER — LOPERAMIDE HCL 2 MG PO CAPS
4.0000 mg | ORAL_CAPSULE | Freq: Four times a day (QID) | ORAL | Status: DC | PRN
  Administered 2024-05-03 – 2024-05-04 (×3): 4 mg via ORAL
  Filled 2024-05-02 (×3): qty 2

## 2024-05-02 MED ORDER — POTASSIUM CHLORIDE 10 MEQ/100ML IV SOLN
10.0000 meq | INTRAVENOUS | Status: DC
  Filled 2024-05-02: qty 100

## 2024-05-02 MED ORDER — PREDNISOLONE ACETATE 1 % OP SUSP
1.0000 [drp] | Freq: Four times a day (QID) | OPHTHALMIC | Status: DC
  Administered 2024-05-02 – 2024-05-07 (×17): 1 [drp] via OPHTHALMIC
  Filled 2024-05-02: qty 1

## 2024-05-02 MED ORDER — CALCIUM GLUCONATE-NACL 1-0.675 GM/50ML-% IV SOLN
1.0000 g | Freq: Once | INTRAVENOUS | Status: AC
  Administered 2024-05-02: 1000 mg via INTRAVENOUS
  Filled 2024-05-02: qty 50

## 2024-05-02 MED ORDER — CALCIUM CARBONATE 1250 (500 CA) MG PO TABS
1.0000 | ORAL_TABLET | Freq: Two times a day (BID) | ORAL | Status: DC
  Administered 2024-05-02 – 2024-05-07 (×9): 1250 mg via ORAL
  Filled 2024-05-02 (×12): qty 1

## 2024-05-02 MED ORDER — BRIMONIDINE TARTRATE 0.2 % OP SOLN
1.0000 [drp] | Freq: Two times a day (BID) | OPHTHALMIC | Status: DC
  Administered 2024-05-02 – 2024-05-07 (×10): 1 [drp] via OPHTHALMIC
  Filled 2024-05-02 (×2): qty 5

## 2024-05-02 MED ORDER — POTASSIUM CHLORIDE 10 MEQ/100ML IV SOLN
10.0000 meq | INTRAVENOUS | Status: DC
  Administered 2024-05-02 (×2): 10 meq via INTRAVENOUS
  Filled 2024-05-02: qty 100

## 2024-05-02 NOTE — Assessment & Plan Note (Signed)
 Unclear etiology.  Will get blood cultures and chest x-ray.

## 2024-05-02 NOTE — Progress Notes (Signed)
 Progress Note   Patient: Lisa [name] FMW:[id number] DOB: 1945/07/27 DOA: 04/30/2024     2 DOS: the patient was seen and examined on 05/02/2024   Brief hospital course: 78 y.o. female with medical history significant for chronic functional diarrhea, chronic electrolyte imbalance, prior history of DVT not on anticoagulation, HTN, HLD, CHF, DM, depression who was recently at [facility] between 03/14/2024 and 03/29/2024 for abdominal pain.  At that time she underwent EGD which showed a small hiatal hernia, normal stomach, normal examined duodenum.  S/p colonoscopy showed a large amount of stool in the entire colon precluding visualization, biopsy taken, colon preparation was poor.  Pathology showed duodenal mucosa with villous architecture.  Colonic mucosa was unremarkable and was negative for microscopic colitis, active inflammation and chronic changes.  If biopsies are known diagnostic, patient will need another colonoscopy outpatient with better preparation as per GI.  Patient did not follow through with colonoscopy nor fecal calprotectin study 06/2022.  Patient was started on scheduled Imodium  per GI recommendation that time.   Today patient came into the hospital complaining of lysed weakness at home to the point that she had to use walker.  She says she has been having usual diarrhea no problem.  She has no problem eating.  She denies any other symptoms.   ED Course: Upon arrival to the ED, patient is found to be severely low potassium at less than 2.0, low calcium  at 6.4, high anion gap at 17, bicarb was 32.  White count was normal.  Patient denied any other symptoms.  Patient was replaced with potassium and calcium  and hospital service was consulted for evaluation for admission for generalized weakness and electrolyte abnormalities.  12/7.  Patient states that she has had diarrhea since August.  Coming in with electrolyte abnormalities and diarrhea.  Case discussed with gastroenterology and may have  an overflow constipation and pancreatic insufficiency will try cholestyramine . 12/8.  Patient complains of blurry vision today.  She states she is blind in her right eye and has blurry vision in her left eye.  Patient able to move her legs better today.  Still having some diarrhea.  Assessment and Plan: * Hypokalemia Severe hypokalemia on presentation.  With aggressive supplementation potassium up to 2.9.  Continue oral and IV supplementation today.  Fever Unclear etiology.  Will get blood cultures and chest x-ray.  Potassium-sensitive periodic paralysis Patient able to move her legs better today than yesterday.  PT evaluation.  Hypomagnesemia 4 g IV magnesium  yesterday and and 2 g IV yesterday afternoon.  Magnesium  normal range today.  Chronic diarrhea of unknown origin Could be secondary to overflow constipation.  With electrolyte abnormalities will monitor today.  Could be secondary to pancreatic insufficiency with pancreatic elastase level.  Will give a trial of Creon .    Sinus bradycardia On Toprol -XL  Hyperlipidemia, unspecified On Crestor   Chronic heart failure with preserved ejection fraction (HFpEF) (HCC) Careful with IV fluids.  No signs of heart failure.  On Toprol  and losartan .  Blurry vision, left eye Appreciate ophthalmology evaluation.  Will need close follow-up as outpatient.  Continue eyedrops.  Hypocalcemia Replace calcium  IV and orally.  Mediastinal mass Seen on imaging back in 2024 with benign appearance.  Follow-up as outpatient  Uncontrolled type 2 diabetes mellitus with hypoglycemia, without long-term current use of insulin  (HCC) Discontinue sliding scale since last hemoglobin A1c 6.4.  Initially on D10 drip.  Tobacco abuse Stop smoking  Benign essential HTN Blood pressure normal but this last 1 very  elevated.  Restart her Norvasc .  On Toprol  and losartan         Subjective: Patient states that her left eye is blurry.  Patient states her  strength is better and able to move her legs better.  Was sitting up eating breakfast when I saw her.  Still having some diarrhea.  No nausea or vomiting.  Noticed a fever this morning.  Physical Exam: Vitals:   05/02/24 0348 05/02/24 0848 05/02/24 0940 05/02/24 1123  BP: (!) 164/77 123/77  (!) 202/98  Pulse: 62 (!) 101 81 75  Resp: 18 17    Temp: 98.9 F (37.2 C) (!) 100.8 F (38.2 C)  98.7 F (37.1 C)  TempSrc: Oral     SpO2: 99% 97%    Weight:      Height:       Physical Exam HENT:     Head: Normocephalic.  Eyes:     General: Lids are normal.     Conjunctiva/sclera: Conjunctivae normal.  Cardiovascular:     Rate and Rhythm: Normal rate and regular rhythm.     Heart sounds: S1 normal and S2 normal. Murmur heard.     Systolic murmur is present with a grade of 2/6.  Pulmonary:     Breath sounds: No decreased breath sounds, wheezing, rhonchi or rales.  Abdominal:     Palpations: Abdomen is soft.     Tenderness: There is no abdominal tenderness.  Musculoskeletal:     Right lower leg: No swelling.     Left lower leg: No swelling.  Skin:    General: Skin is warm.     Findings: No rash.  Neurological:     Mental Status: She is alert and oriented to person, place, and time.     Comments: Patient able to lift her legs up off the bed.     Data Reviewed: Sodium 141, potassium 2.9, creatinine 0.67, calcium  6.2, phosphorus 2.6, magnesium  2.1, GFR greater than 60 Abdominal x-ray shows mild diffuse and is gaseous distention of the bowel and fecal material within the right colon consistent with encopresis  Disposition: Status is: Inpatient Remains inpatient appropriate because: Today with blurry vision.  Replacing potassium today IV and orally  Planned Discharge Destination: Home with Home Health    Time spent: 30 minutes Case discussed with nursing staff, pharmacy and ophthalmology  Author: Charlie Patterson, MD 05/02/2024 12:32 PM  For on call review [email or web address].

## 2024-05-02 NOTE — Evaluation (Signed)
 Occupational Therapy Evaluation Patient Details Name: Lisa [name] MRN: [id number] DOB: [birth year] Today's Date: 05/02/2024   History of Present Illness   Lisa [name] is a pleasant 78 y.o. female with medical history significant for chronic functional diarrhea, chronic electrolyte imbalance, prior history of DVT not on anticoagulation, HTN, HLD, CHF, DM, depression. Today patient came into the hospital complaining of BLE weakness at home to the point that she had to use walker.     Clinical Impressions Patient was seen for OT evaluation this date. Prior to hospital admission, patient was living alone and independent. Entire PT/OT eval spent performing bed level bowel/bladder incontinence care; patient with increased anxiety throughout, increased work of breath (unable to get O2 sat reading) tripoding while long sitting or sitting EOB. Education on pacing/PLB provided with minimal improvement until end of tx. MD present towards end of tx to evaluate due to respiratory distress. Patient was agreeable to purewick which was placed at end of tx Patient presents with deficits in activity tolerance, affecting safe and optimal ADL completion. Patient is currently requiring total A for toilet ing due to incontinence.  Paient would benefit from skilled OT services to address noted impairments and functional limitations (see below for any additional details) in order to maximize safety and independence while minimizing future risk of falls, injury, and readmission.  Anticipate the need for follow up OT services upon acute hospital DC.      If plan is discharge home, recommend the following:   Two people to help with walking and/or transfers;Two people to help with bathing/dressing/bathroom;Help with stairs or ramp for entrance     Functional Status Assessment   Patient has had a recent decline in their functional status and demonstrates the ability to make significant improvements  in function in a reasonable and predictable amount of time.     Equipment Recommendations   Other (comment) (defer to next venue of care)     Recommendations for Other Services         Precautions/Restrictions   Precautions Precautions: Fall Recall of Precautions/Restrictions: Intact Restrictions Weight Bearing Restrictions Per Provider Order: No     Mobility Bed Mobility Overal bed mobility: Needs Assistance Bed Mobility: Rolling, Sit to Supine, Supine to Sit Rolling: Min assist   Supine to sit: Min assist Sit to supine: Min assist        Transfers                          Balance                                           ADL either performed or assessed with clinical judgement   ADL Overall ADL's : Needs assistance/impaired             Lower Body Bathing: Total assistance;+2 for physical assistance;Bed level   Upper Body Dressing : Moderate assistance;Sitting   Lower Body Dressing: Total assistance                 General ADL Comments: incontinent of bowels 6+ times during eval, max-total A for pericare and linen change     Vision         Perception         Praxis         Pertinent Vitals/Pain Pain Assessment Pain Assessment: No/denies  pain     Extremity/Trunk Assessment Upper Extremity Assessment Upper Extremity Assessment: Generalized weakness   Lower Extremity Assessment Lower Extremity Assessment: Generalized weakness       Communication Communication Communication: No apparent difficulties   Cognition Arousal: Alert Behavior During Therapy: Anxious Cognition: No apparent impairments                               Following commands: Intact       Cueing  General Comments   Cueing Techniques: Verbal cues  patient on 2L throughout, unable to get reading, increased work of breathing during incontinence and bed mobility   Exercises     Shoulder Instructions       Home Living Family/patient expects to be discharged to:: Private residence Living Arrangements: Alone Available Help at Discharge: Family;Available PRN/intermittently Type of Home: House Home Access: Stairs to enter;Ramped entrance Entrance Stairs-Number of Steps: 2 Entrance Stairs-Rails: Right;Left;Can reach both Home Layout: One level     Bathroom Shower/Tub: Walk-in shower;Tub/shower unit   Bathroom Toilet: Handicapped height     Home Equipment: Agricultural [employer]ant (2 wheels);Cane - single point;Shower seat - built in          Prior Functioning/Environment Prior Level of Function : Independent/Modified Independent             Mobility Comments: Reports use of SPC ADLs Comments: IND with ADLs, reports minimal driving, will be selling her car, blind in R eye    OT Problem List: Decreased strength;Decreased activity tolerance;Impaired balance (sitting and/or standing)   OT Treatment/Interventions: Self-care/ADL training;Therapeutic exercise;Energy conservation;DME and/or AE instruction;Therapeutic activities;Patient/family education;Balance training      OT Goals(Current goals can be found in the care plan section)   Acute Rehab OT Goals Patient Stated Goal: to go home OT Goal Formulation: With patient Time For Goal Achievement: 05/16/24 Potential to Achieve Goals: Good ADL Goals Pt Will Perform Grooming: with min assist;sitting Pt Will Perform Lower Body Dressing: with min assist;sit to/from stand Pt Will Transfer to Toilet: with contact guard assist;bedside commode   OT Frequency:  Min 2X/week    Co-evaluation PT/OT/SLP Co-Evaluation/Treatment: Yes Reason for Co-Treatment: Complexity of the patient's impairments (multi-system involvement);Necessary to address cognition/behavior during functional activity;For patient/therapist safety;To address functional/ADL transfers PT goals addressed during session: Mobility/safety with mobility OT goals addressed during  session: ADL's and self-care      AM-PAC OT 6 Clicks Daily Activity     Outcome Measure Help from another person eating meals?: None Help from another person taking care of personal grooming?: A Little Help from another person toileting, which includes using toliet, bedpan, or urinal?: Total Help from another person bathing (including washing, rinsing, drying)?: Total Help from another person to put on and taking off regular upper body clothing?: A Lot Help from another person to put on and taking off regular lower body clothing?: Total 6 Click Score: 12   End of Session Nurse Communication: Mobility status  Activity Tolerance: Patient limited by fatigue;Treatment limited secondary to medical complications (Comment) Patient left: in bed;with call bell/phone within reach;with bed alarm set  OT Visit Diagnosis: Unsteadiness on feet (R26.81);Other abnormalities of gait and mobility (R26.89);Muscle weakness (generalized) (M62.81)                Time: 8643-8483 OT Time Calculation (min): 80 min Charges:  OT General Charges $OT Visit: 1 Visit OT Evaluation $OT Eval Moderate Complexity: 1 Mod OT Treatments $Self Care/Home  Management : 38-52 mins  Rogers Clause, OT/L MSOT, 05/02/2024

## 2024-05-02 NOTE — Consult Note (Signed)
 Reason for Consult:blurred vision Referring Physician: [name] is an 78 y.o. female.  Chief complaint: blur left eye for < 1 week Hypomagnesemia  HPI: Pt is 78 yo BF well known to me.  Blind OD.  Moderate glaucoma OS c/o 1 week blurred vision left eye.  Admitted w severe weakness, low K, low Mg, and diarrhea.  Past Medical History:  Diagnosis Date   Blindness of right eye    CHF (congestive heart failure) (HCC)    Depression    Diabetes mellitus without complication (HCC)    DVT of leg (deep venous thrombosis) (HCC) 2011   Glaucoma    Grade II diastolic dysfunction    Hyperlipidemia    Hypertension    Vascular disease     ROS  Past Surgical History:  Procedure Laterality Date   ABDOMINAL HYSTERECTOMY  1992   Total   CATARACT EXTRACTION W/PHACO Left 02/25/2023   Procedure: CATARACT EXTRACTION PHACO AND INTRAOCULAR LENS PLACEMENT (IOC) LEFT DIABETIC  malyugin;  Surgeon: Mittie Gaskin, MD;  Location: [facility];  Service: Ophthalmology;  Laterality: Left;  10.78 0:49.1   COLONOSCOPY N/A 03/24/2024   Procedure: COLONOSCOPY;  Surgeon: Jinny Carmine, MD;  Location: [facility] END[facility];  Service: Endoscopy;  Laterality: N/A;   ESOPHAGOGASTRODUODENOSCOPY N/A 03/24/2024   Procedure: EGD (ESOPHAGOGASTRODUODENOSCOPY);  Surgeon: Jinny Carmine, MD;  Location: [facility] END[facility];  Service: Endoscopy;  Laterality: N/A;   EYE SURGERY     LOWER EXTREMITY ANGIOGRAPHY Left 09/03/2023   Procedure: Lower Extremity Angiography;  Surgeon: Marea Selinda RAMAN, MD;  Location: [facility] INVASIVE CV LAB;  Service: Cardiovascular;  Laterality: Left;   LOWER EXTREMITY ANGIOGRAPHY Left 11/05/2023   Procedure: Lower Extremity Angiography;  Surgeon: Marea Selinda RAMAN, MD;  Location: [facility] IN[facility];  Service: Cardiovascular;  Laterality: Left;   LOWER EXTREMITY INTERVENTION Left 09/03/2023   Procedure: LOWER EXTREMITY INTERVENTION;  Surgeon: Marea Selinda RAMAN, MD;  Location: [facility] INVAS[facility] CV LAB;   Service: Cardiovascular;  Laterality: Left;   LOWER EXTREMITY INTERVENTION Left 11/05/2023   Procedure: LOWER EXTREMITY INTERVENTION;  Surgeon: Marea Selinda RAMAN, MD;  Location: [facility] INVASIVE CV LAB;  Service: Cardiovascular;  Laterality: Left;   TOE AMPUTATION Left 2011   All of her toes on the left foot   VASCULAR SURGERY      Family History  Problem Relation Age of Onset   Diabetes Mother    Heart disease Father    Diabetes Sister    Diabetes Sister    Diabetes Brother    Kidney disease Brother        Transplant   Heart disease Brother     Social History:  reports that she has been smoking cigarettes. She started smoking about 24 years ago. She has a 10.3 pack-year smoking history. She has never used smokeless tobacco. She reports current alcohol  use. She reports that she does not use drugs.  Allergies:  Allergies  Allergen Reactions   Other Itching    Nucinta - Hallucinations   Tapentadol Hcl Itching    Hallucinations   Actos  [Pioglitazone ]     History of CHF   Penicillins Rash    Medications: Scheduled:  aspirin  EC  81 mg Oral Daily   atropine   1 drop Right Eye TID   brimonidine   1 drop Left Eye BID   calcium  carbonate  1 tablet Oral BID WC   citalopram   10 mg Oral Daily   clopidogrel   75 mg Oral Daily   cyanocobalamin   1,000  mcg Oral Daily   dorzolamide -timolol   1 drop Left Eye BID   enoxaparin  (LOVENOX ) injection  40 mg Subcutaneous Q24H   gabapentin   100 mg Oral TID   lipase/protease/amylase  72,000 Units Oral TID AC   losartan   100 mg Oral Daily   metoprolol  succinate  12.5 mg Oral Daily   multivitamin with minerals  1 tablet Oral Daily   potassium chloride   40 mEq Oral TID   prednisoLONE  acetate  1 drop Right Eye QID   rosuvastatin   10 mg Oral Daily    Results for orders placed or performed during the hospital encounter of 04/30/24 (from the past 48 hours)  CBG monitoring, ED     Status: Abnormal   Collection Time: 04/30/24  2:58 PM  Result Value Ref Range    Glucose-Capillary 13 (LL) 70 - 99 mg/dL    Comment: Glucose reference range applies only to samples taken after fasting for at least 8 hours.   Comment 1 Notify RN   Comprehensive metabolic panel     Status: Abnormal   Collection Time: 04/30/24  3:03 PM  Result Value Ref Range   Sodium 143 135 - 145 mmol/L   Potassium <2.0 (LL) 3.5 - 5.1 mmol/L    Comment: Critical Value, Read Back and verified with REINA GALJOUR @1549  04/30/24 MJU    Chloride 97 (L) 98 - 111 mmol/L   CO2 33 (H) 22 - 32 mmol/L   Glucose, Bld 325 (H) 70 - 99 mg/dL    Comment: Glucose reference range applies only to samples taken after fasting for at least 8 hours.   BUN 17 8 - 23 mg/dL   Creatinine, Ser 9.15 0.44 - 1.00 mg/dL   Calcium  6.2 (LL) 8.9 - 10.3 mg/dL    Comment: Critical Value, Read Back and verified with REINA GALJOUR @1549  04/30/24 MJU    Total Protein 6.2 (L) 6.5 - 8.1 g/dL   Albumin 3.4 (L) 3.5 - 5.0 g/dL   AST 43 (H) 15 - 41 U/L   ALT 20 0 - 44 U/L   Alkaline Phosphatase 91 38 - 126 U/L   Total Bilirubin 0.7 0.0 - 1.2 mg/dL   GFR, Estimated >39 >39 mL/min    Comment: (NOTE) Calculated using the CKD-EPI Creatinine Equation (2021)    Anion gap 13 5 - 15    Comment: Performed at [facility], [address] [place], [place] [address]  CBC with Differential     Status: Abnormal   Collection Time: 04/30/24  3:03 PM  Result Value Ref Range   WBC 10.5 4.0 - 10.5 K/uL   RBC 4.50 3.87 - 5.11 MIL/uL   Hemoglobin 12.1 12.0 - 15.0 g/dL   HCT 62.2 63.9 - 53.9 %   MCV 83.8 80.0 - 100.0 fL   MCH 26.9 26.0 - 34.0 pg   MCHC 32.1 30.0 - 36.0 g/dL   RDW 80.3 (H) 88.4 - 84.4 %   Platelets 278 150 - 400 K/uL   nRBC 0.0 0.0 - 0.2 %   Neutrophils Relative % 88 %   Neutro Abs 9.3 (H) 1.7 - 7.7 K/uL   Lymphocytes Relative 6 %   Lymphs Abs 0.6 (L) 0.7 - 4.0 K/uL   Monocytes Relative 5 %   Monocytes Absolute 0.5 0.1 - 1.0 K/uL   Eosinophils Relative 0 %   Eosinophils Absolute 0.0 0.0 - 0.5 K/uL    Basophils Relative 0 %   Basophils Absolute 0.0 0.0 - 0.1 K/uL  Immature Granulocytes 1 %   Abs Immature Granulocytes 0.05 0.00 - 0.07 K/uL    Comment: Performed at [facility], [address] [place], [place] [address]  Lactic acid, plasma     Status: None   Collection Time: 04/30/24  3:11 PM  Result Value Ref Range   Lactic Acid, Venous 1.9 0.5 - 1.9 mmol/L    Comment: Performed at [facility], [address] [place], [place] [address]  CBG monitoring, ED     Status: Abnormal   Collection Time: 04/30/24  3:30 PM  Result Value Ref Range   Glucose-Capillary 44 (LL) 70 - 99 mg/dL    Comment: Glucose reference range applies only to samples taken after fasting for at least 8 hours.   Comment 1 Call MD NNP PA CNM   Urinalysis, w/ Reflex to Culture (Infection Suspected) -Urine, Clean Catch     Status: Abnormal   Collection Time: 04/30/24  3:31 PM  Result Value Ref Range   Specimen Source URINE, CLEAN CATCH    Color, Urine COLORLESS (A) YELLOW   APPearance CLEAR (A) CLEAR   Specific Gravity, Urine 1.008 1.005 - 1.030   pH 6.0 5.0 - 8.0   Glucose, UA >=500 (A) NEGATIVE mg/dL   Hgb urine dipstick SMALL (A) NEGATIVE   Bilirubin Urine NEGATIVE NEGATIVE   Ketones, ur NEGATIVE NEGATIVE mg/dL   Protein, ur NEGATIVE NEGATIVE mg/dL   Nitrite NEGATIVE NEGATIVE   Leukocytes,Ua NEGATIVE NEGATIVE   RBC / HPF 0 0 - 5 RBC/hpf   WBC, UA 0-5 0 - 5 WBC/hpf    Comment:        Reflex urine culture not performed if WBC <=10, OR if Squamous epithelial cells >5. If Squamous epithelial cells >5 suggest recollection.    Bacteria, UA RARE (A) NONE SEEN   Squamous Epithelial / HPF 0-5 0 - 5 /HPF    Comment: Performed at [facility], [address] [place], [place] [address]  Basic metabolic panel     Status: Abnormal   Collection Time: 04/30/24  4:05 PM  Result Value Ref Range   Sodium 144 135 - 145 mmol/L   Potassium <2.0 (LL) 3.5 - 5.1 mmol/L    Comment: Critical  Value, Read Back and verified with REINA GALJOUR @1645  04/30/24 MJU    Chloride 95 (L) 98 - 111 mmol/L   CO2 32 22 - 32 mmol/L   Glucose, Bld 294 (H) 70 - 99 mg/dL    Comment: Glucose reference range applies only to samples taken after fasting for at least 8 hours.   BUN 16 8 - 23 mg/dL   Creatinine, Ser 9.14 0.44 - 1.00 mg/dL   Calcium  6.4 (LL) 8.9 - 10.3 mg/dL    Comment: Critical Value, Read Back and verified with REINA GALJOUR @1645  04/30/24 MJU    GFR, Estimated >60 >60 mL/min    Comment: (NOTE) Calculated using the CKD-EPI Creatinine Equation (2021)    Anion gap 17 (H) 5 - 15    Comment: Performed at [facility], [address] [place], [place] [address]  CBG monitoring, ED     Status: Abnormal   Collection Time: 04/30/24 10:38 PM  Result Value Ref Range   Glucose-Capillary 181 (H) 70 - 99 mg/dL    Comment: Glucose reference range applies only to samples taken after fasting for at least 8 hours.  Basic metabolic panel     Status: Abnormal   Collection Time: 05/01/24 12:26 AM  Result Value Ref Range   Sodium 145 135 - 145 mmol/L   Potassium <2.0 (LL) 3.5 - 5.1 mmol/L    Comment: Critical Value, Read Back and verified with Katheren Mayotte 05/01/24 0123 KLW    Chloride 98 98 - 111 mmol/L   CO2 32 22 - 32 mmol/L   Glucose, Bld 130 (H) 70 - 99 mg/dL    Comment: Glucose reference range applies only to samples taken after fasting for at least 8 hours.   BUN 14 8 - 23 mg/dL   Creatinine, Ser 9.21 0.44 - 1.00 mg/dL   Calcium  6.2 (LL) 8.9 - 10.3 mg/dL    Comment: Critical Value, Read Back and verified with Katheren Mayotte 05/01/24 0123 KLW    GFR, Estimated >60 >60 mL/min    Comment: (NOTE) Calculated using the CKD-EPI Creatinine Equation (2021)    Anion gap 15 5 - 15    Comment: Performed at [facility], [address] [place], [place] [address]  Comprehensive metabolic panel     Status: Abnormal   Collection Time: 05/01/24  6:12 AM  Result Value Ref Range    Sodium 142 135 - 145 mmol/L   Potassium 2.1 (LL) 3.5 - 5.1 mmol/L    Comment: Critical Value, Read Back and verified with NICOLE HARDY @0659  05/01/24 MJU    Chloride 100 98 - 111 mmol/L   CO2 30 22 - 32 mmol/L   Glucose, Bld 137 (H) 70 - 99 mg/dL    Comment: Glucose reference range applies only to samples taken after fasting for at least 8 hours.   BUN 13 8 - 23 mg/dL   Creatinine, Ser 9.26 0.44 - 1.00 mg/dL   Calcium  6.6 (L) 8.9 - 10.3 mg/dL   Total Protein 5.3 (L) 6.5 - 8.1 g/dL   Albumin 2.9 (L) 3.5 - 5.0 g/dL   AST 38 15 - 41 U/L   ALT 17 0 - 44 U/L   Alkaline Phosphatase 73 38 - 126 U/L   Total Bilirubin 0.6 0.0 - 1.2 mg/dL   GFR, Estimated >39 >39 mL/min    Comment: (NOTE) Calculated using the CKD-EPI Creatinine Equation (2021)    Anion gap 13 5 - 15    Comment: Performed at [facility], [address] [place], [place] [address]  CBC     Status: Abnormal   Collection Time: 05/01/24  6:12 AM  Result Value Ref Range   WBC 9.3 4.0 - 10.5 K/uL   RBC 3.72 (L) 3.87 - 5.11 MIL/uL   Hemoglobin 10.4 (L) 12.0 - 15.0 g/dL   HCT 68.9 (L) 63.9 - 53.9 %   MCV 83.3 80.0 - 100.0 fL   MCH 28.0 26.0 - 34.0 pg   MCHC 33.5 30.0 - 36.0 g/dL   RDW 80.2 (H) 88.4 - 84.4 %   Platelets 258 150 - 400 K/uL   nRBC 0.0 0.0 - 0.2 %    Comment: Performed at [facility], [address] [place], [place] [address]  Protime-INR     Status: Abnormal   Collection Time: 05/01/24  6:12 AM  Result Value Ref Range   Prothrombin Time 15.8 (H) 11.4 - 15.2 seconds   INR 1.2 0.8 - 1.2    Comment: (NOTE) INR goal varies based on device and disease states. Performed at [facility], [address], [place], [place] [address]   Phosphorus     Status: None   Collection Time: 05/01/24  6:12 AM  Result  Value Ref Range   Phosphorus 3.3 2.5 - 4.6 mg/dL    Comment: Performed at [facility], [address] [place], [place] [address]  Magnesium      Status: Abnormal    Collection Time: 05/01/24  6:12 AM  Result Value Ref Range   Magnesium  0.8 (LL) 1.7 - 2.4 mg/dL    Comment: Critical Value, Read Back and verified with NICOLE HARDY @0659  05/01/24 MJU Performed at [facility], [address] [place], [place] [address]   C Difficile Quick Screen w PCR reflex     Status: None   Collection Time: 05/01/24  7:22 AM   Specimen: STOOL  Result Value Ref Range   C Diff antigen NEGATIVE NEGATIVE   C Diff toxin NEGATIVE NEGATIVE   C Diff interpretation No C. difficile detected.     Comment: Performed at [facility], [address] [place], [place] [address]  Gastrointestinal Panel by PCR , Stool     Status: None   Collection Time: 05/01/24  7:22 AM   Specimen: Stool  Result Value Ref Range   Campylobacter species NOT DETECTED NOT DETECTED   Plesimonas shigelloides NOT DETECTED NOT DETECTED   Salmonella species NOT DETECTED NOT DETECTED   Yersinia enterocolitica NOT DETECTED NOT DETECTED   Vibrio species NOT DETECTED NOT DETECTED   Vibrio cholerae NOT DETECTED NOT DETECTED   Enteroaggregative E coli (EAEC) NOT DETECTED NOT DETECTED   Enteropathogenic E coli (EPEC) NOT DETECTED NOT DETECTED   Enterotoxigenic E coli (ETEC) NOT DETECTED NOT DETECTED   Shiga like toxin producing E coli (STEC) NOT DETECTED NOT DETECTED   Shigella/Enteroinvasive E coli (EIEC) NOT DETECTED NOT DETECTED   Cryptosporidium NOT DETECTED NOT DETECTED   Cyclospora cayetanensis NOT DETECTED NOT DETECTED   Entamoeba histolytica NOT DETECTED NOT DETECTED   Giardia lamblia NOT DETECTED NOT DETECTED   Adenovirus F40/41 NOT DETECTED NOT DETECTED   Astrovirus NOT DETECTED NOT DETECTED   Norovirus GI/GII NOT DETECTED NOT DETECTED   Rotavirus A NOT DETECTED NOT DETECTED   Sapovirus (I, II, IV, and V) NOT DETECTED NOT DETECTED    Comment: Performed at [facility], [address] [place], [place] [address]  CBG monitoring, ED     Status: None   Collection  Time: 05/01/24  8:26 AM  Result Value Ref Range   Glucose-Capillary 96 70 - 99 mg/dL    Comment: Glucose reference range applies only to samples taken after fasting for at least 8 hours.  CBG monitoring, ED     Status: Abnormal   Collection Time: 05/01/24 12:23 PM  Result Value Ref Range   Glucose-Capillary 153 (H) 70 - 99 mg/dL    Comment: Glucose reference range applies only to samples taken after fasting for at least 8 hours.  Basic metabolic panel with GFR     Status: Abnormal   Collection Time: 05/01/24  4:14 PM  Result Value Ref Range   Sodium 141 135 - 145 mmol/L   Potassium 2.6 (LL) 3.5 - 5.1 mmol/L    Comment: Critical Value, Read Back and verified with AMBER LEONARD @1701  05/01/24 MJU    Chloride 100 98 - 111 mmol/L   CO2 29 22 - 32 mmol/L   Glucose, Bld 184 (H) 70 - 99 mg/dL    Comment: Glucose reference range applies only to samples taken after fasting for at least 8 hours.   BUN 11 8 - 23 mg/dL   Creatinine, Ser 9.23 0.44 - 1.00  mg/dL   Calcium  6.6 (L) 8.9 - 10.3 mg/dL   GFR, Estimated >39 >39 mL/min    Comment: (NOTE) Calculated using the CKD-EPI Creatinine Equation (2021)    Anion gap 12 5 - 15    Comment: Performed at [facility], [address] [place], [place] [address]  Glucose, capillary     Status: Abnormal   Collection Time: 05/01/24  4:28 PM  Result Value Ref Range   Glucose-Capillary 177 (H) 70 - 99 mg/dL    Comment: Glucose reference range applies only to samples taken after fasting for at least 8 hours.  Glucose, capillary     Status: Abnormal   Collection Time: 05/01/24  9:38 PM  Result Value Ref Range   Glucose-Capillary 229 (H) 70 - 99 mg/dL    Comment: Glucose reference range applies only to samples taken after fasting for at least 8 hours.  Magnesium      Status: None   Collection Time: 05/02/24  3:20 AM  Result Value Ref Range   Magnesium  2.1 1.7 - 2.4 mg/dL    Comment: Performed at [facility], [address]  [place], [place] [address]  Basic metabolic panel with GFR     Status: Abnormal   Collection Time: 05/02/24  3:20 AM  Result Value Ref Range   Sodium 141 135 - 145 mmol/L   Potassium 2.9 (L) 3.5 - 5.1 mmol/L   Chloride 101 98 - 111 mmol/L   CO2 28 22 - 32 mmol/L   Glucose, Bld 147 (H) 70 - 99 mg/dL    Comment: Glucose reference range applies only to samples taken after fasting for at least 8 hours.   BUN 11 8 - 23 mg/dL   Creatinine, Ser 9.32 0.44 - 1.00 mg/dL   Calcium  6.2 (LL) 8.9 - 10.3 mg/dL    Comment: Critical Value, Read Back and verified with JASON BYERS, RN @0524  05/02/2024 COP    GFR, Estimated >60 >60 mL/min    Comment: (NOTE) Calculated using the CKD-EPI Creatinine Equation (2021)    Anion gap 12 5 - 15    Comment: Performed at [facility], [address] [place], [place] [address]  Phosphorus     Status: None   Collection Time: 05/02/24  3:20 AM  Result Value Ref Range   Phosphorus 2.6 2.5 - 4.6 mg/dL    Comment: Performed at [facility], [address] [place], [place] [address]  Glucose, capillary     Status: Abnormal   Collection Time: 05/02/24  8:38 AM  Result Value Ref Range   Glucose-Capillary 112 (H) 70 - 99 mg/dL    Comment: Glucose reference range applies only to samples taken after fasting for at least 8 hours.    DG Abd 1 View Result Date: 05/01/2024 EXAM: 1 VIEW XRAY OF THE ABDOMEN 05/01/2024 09:08:25 AM COMPARISON: None available. CLINICAL HISTORY: [id number] Encopresis [id number] Encopresis. FINDINGS: BOWEL: Mild diffuse gaseous distention of the bowel. Fecal material within the right colon. SOFT TISSUES: Moderate vascular calcifications. BONES: No acute fracture. HEART: The heart is enlarged. IMPRESSION: 1. Mild diffuse gaseous distention of the bowel and fecal material within the right colon, consistent with encopresis. 2. Enlarged heart. 3. Moderate vascular calcifications. Electronically signed by: Evalene Coho MD 05/01/2024 09:13 AM  EST RP Workstation: [id number]    Blood pressure (!) 202/98, pulse 75, temperature 98.7 F (37.1 C), resp. rate 17, height 5' 4 (1.626 m), weight 57.5 kg, SpO2 97%.  Mental status: Alert and  Oriented x 4  Visual Acuity:  20/NLP OD  20/100 near [place]  Pupils: reactive to light OS.  Afferent defect OD  Motility:  Full/ orthophoric  Visual Fields:  Full to confrontation OS  IOP:  28 tonopen OS  External/ Lids/ Lashes:  Normal  Anterior Segment:  Conjunctiva:  Normal  OU  Cornea:  Normal  OU  Anterior Chamber: Normal  OU  Lens:   IOL OS  Posterior Segment: Dilated OU with 1% Tropicamide  and 2.5% Phenylephrine  Discs:   0.9 c/d ratio, no pallor, no edema OU  Macula:  Normal  Vessels/ Periphery: Normal no hemorrhages    Assessment/Plan: Moderate to advanced glaucoma OS - concerns about pt compliance with drops.  Has missed appointments since 12/24 for follow up.  Resume Cosopt / Brim OS BID.  Pred and Atropine  OD BID Blurred vision may be due to uncontrolled glaucoma, but more likely temporary and related to weakness and electrolyte abnormalities.  DM- no retinopathy noted on exam.  Chronic iritis OD/ NLP OD - gtts as noted above.  Close follow up with me in office 1-2 weeks after discharge.   Soham Hollett 05/02/2024, 11:54 AM

## 2024-05-02 NOTE — Evaluation (Signed)
 Physical Therapy Evaluation Patient Details Name: Lisa [name] MRN: [id number] DOB: Jun 25, 1945 Today's Date: 05/02/2024  History of Present Illness  presented to ER secondary to progressive weakness, persistent diarrhead; admitted for management of severe electrolyte abnormalities.  Of note, recent hospitalization 10/20-11/4 secondary to abdominal pain; similar course.  GI involved.  Clinical Impression  Patient resting in bed upon arrival to session; multiple team members (RN, lab, therapy) present at bedside and patient visibly overwhelmed with activity.  Noted to be very short of breath-using accessory muscles to breathe at times, but is agreeable to light participation with therapist to address peri-care and repositioning in bed for improved respiratory control. Patient globally weak and deconditioned throughout extremities (grossly at least 3/5); no focal weakness appreciated.  Able to complete rolling bilat, supine/sit with min assist from therapist multiple times throughout session Total assist for ADL activities (gown, linen change, peri-care) due to persistent, incontinent bowel/bladder during session.  Additional OOB/mobility activities deferred due to worsening respiratory status with minimal mobility. Patient progressively anxious and SOB as session progressed, noted difficulty calming respiratory effort.  Patient spontaneously transitioning from supine/sit multiple times in attempt to improve positioning and respiratory status (tripoding at times when upright).  MD in during session for patient reassessment; discussed trialing medication for anxiety and reviewed additional medical management of respiratory status and ongoing diarrhea.  Patient agreeable to trialing anxiety meds; may consider coordinating with subsequent therapy sessions as appropriate. Patient calmed end of session; physically appears much more comfortable, much less labored in respiratory effort.  Patient  appreciative of efforts and care provided during session. Would benefit from skilled PT to address above deficits and promote optimal return to PLOF.; recommend post-acute PT follow up as indicated by interdisciplinary care team.          If plan is discharge home, recommend the following: A lot of help with walking and/or transfers;A lot of help with bathing/dressing/bathroom   Can travel by private vehicle   No    Equipment Recommendations    Recommendations for Other Services       Functional Status Assessment Patient has had a recent decline in their functional status and demonstrates the ability to make significant improvements in function in a reasonable and predictable amount of time.     Precautions / Restrictions Precautions Precautions: Fall Recall of Precautions/Restrictions: Intact Restrictions Weight Bearing Restrictions Per Provider Order: No      Mobility  Bed Mobility Overal bed mobility: Needs Assistance Bed Mobility: Supine to Sit, Sit to Supine Rolling: Min assist   Supine to sit: Min assist Sit to supine: Min assist   General bed mobility comments: assist for LE management, truncal elevation due to generalized weakness.  Maintains supported/unsupported sitting balance with close sup during session    Transfers                   General transfer comment: Unsafe/unable to tolerate due to respiratory status and ongoing bowel/bladder incontinence    Ambulation/Gait               General Gait Details: Unsafe/unable to tolerate due to respiratory status and ongoing bowel/bladder incontinence  Stairs            Wheelchair Mobility     Tilt Bed    Modified Rankin (Stroke Patients Only)       Balance Overall balance assessment: Needs assistance Sitting-balance support: No upper extremity supported, Feet supported Sitting balance-Leahy Scale: Fair  Pertinent  Vitals/Pain Pain Assessment Pain Assessment: No/denies pain    Home Living Family/patient expects to be discharged to:: Private residence Living Arrangements: Alone Available Help at Discharge: Family;Available PRN/intermittently Type of Home: House Home Access: Stairs to enter;Ramped entrance Entrance Stairs-Rails: Right;Left;Can reach both Entrance Stairs-Number of Steps: 2   Home Layout: One level Home Equipment: [employer] (2 wheels);Cane - single point;Shower seat - built in      Prior Function Prior Level of Function : Independent/Modified Independent             Mobility Comments: Mod indep with SPC for household mobilization; no home O2; denies fall history. ADLs Comments: IND with ADLs, reports minimal driving, will be selling her car, blind in R eye     Extremity/Trunk Assessment   Upper Extremity Assessment Upper Extremity Assessment: Generalized weakness    Lower Extremity Assessment Lower Extremity Assessment: Generalized weakness (grossly at least 3/5 throughout)       Communication   Communication Communication: No apparent difficulties    Cognition Arousal: Alert Behavior During Therapy: Anxious                             Following commands: Intact       Cueing Cueing Techniques: Verbal cues     General Comments General comments (skin integrity, edema, etc.): patient on 2L throughout, unable to get reading, increased work of breathing during incontinence and bed mobility    Exercises Other Exercises Other Exercises: Primary session focus on peri-care, gown/linen change due to ongoing bowel/bladder incontinence during session (x6 bed changes). Rolls bilat with min assist; transitions to unsupported sitting multiple times (tripoding,at times, for respiratory support) with min assist.  Generally anxious throughout session.  Limited ability to calm/stabilize respiratory status during initial session, but does improve towards end  of session (lasix  admin per RN during session)   Assessment/Plan    PT Assessment Patient needs continued PT services  PT Problem List Decreased strength;Decreased range of motion;Decreased activity tolerance;Decreased balance;Decreased mobility;Decreased knowledge of use of DME;Decreased safety awareness;Decreased knowledge of precautions;Cardiopulmonary status limiting activity       PT Treatment Interventions DME instruction;Gait training;Stair training;Functional mobility training;Therapeutic activities;Therapeutic exercise;Balance training;Patient/family education;Cognitive remediation    PT Goals (Current goals can be found in the Care Plan section)  Acute Rehab PT Goals Patient Stated Goal: to get better PT Goal Formulation: With patient Time For Goal Achievement: 05/16/24 Potential to Achieve Goals: Good    Frequency Min 2X/week     Co-evaluation   Reason for Co-Treatment: Complexity of the patient's impairments (multi-system involvement);Necessary to address cognition/behavior during functional activity;For patient/therapist safety;To address functional/ADL transfers PT goals addressed during session: Mobility/safety with mobility OT goals addressed during session: ADL's and self-care       AM-PAC PT 6 Clicks Mobility  Outcome Measure Help needed turning from your back to your side while in a flat bed without using bedrails?: A Little Help needed moving from lying on your back to sitting on the side of a flat bed without using bedrails?: A Little Help needed moving to and from a bed to a chair (including a wheelchair)?: Total Help needed standing up from a chair using your arms (e.g., wheelchair or bedside chair)?: Total Help needed to walk in hospital room?: Total Help needed climbing 3-5 steps with a railing? : Total 6 Click Score: 10    End of Session   Activity Tolerance: Patient limited by fatigue;Treatment  limited secondary to medical complications (Comment)  (respiratory status) Patient left: in bed;with call bell/phone within reach;with bed alarm set Nurse Communication: Mobility status PT Visit Diagnosis: Muscle weakness (generalized) (M62.81);Difficulty in walking, not elsewhere classified (R26.2)    Time: 8667-8483 PT Time Calculation (min) (ACUTE ONLY): 104 min   Charges:     PT Treatments $Therapeutic Activity: 53-67 mins PT General Charges $$ ACUTE PT VISIT: 1 Visit        Aranza Geddes H. Delores, PT, DPT, NCS 05/02/24, 5:51 PM [phone]

## 2024-05-02 NOTE — Progress Notes (Signed)
 PHARMACY CONSULT NOTE - FOLLOW UP  Pharmacy Consult for Electrolyte Monitoring and Replacement   Recent Labs: Potassium (mmol/L)  Date Value  05/02/2024 2.9 (L)   Magnesium  (mg/dL)  Date Value  87/91/7974 2.1   Calcium  (mg/dL)  Date Value  87/91/7974 6.2 (LL)   Albumin (g/dL)  Date Value  87/92/7974 2.9 (L)  09/25/2015 4.4   Phosphorus (mg/dL)  Date Value  87/91/7974 2.6   Sodium (mmol/L)  Date Value  05/02/2024 141  09/25/2015 142   Assessment: K = < 2.0  Ca = 6.2,  Alb = 2.9 ,  Corrected Ca = 7.1 78 yo F with PMH including chronic diarrhea and electrolyte imbalances presenting with abdominal pain.  Medications: Calcium  carbonate 1250mg  BID, losartan , KCL 40 meq po TID  Goal of Therapy:  Electrolytes WNL   Plan: K 2.9 >> provider ordered potassium chloride  10mEq x 3 doses. Also has active order for 40mEq PO TID Will check K at 1800 Corrected Calcium  7.1  >> provider ordered Calcium  gluconate 1 gm IV x 1 Check electrolytes with AM labs  Allean Haas PharmD Clinical Pharmacist 05/02/2024

## 2024-05-02 NOTE — Progress Notes (Signed)
 Dr Josette notified of patient's continuous refusal of oral medication, increased anxiety and uncontrolled vitals.

## 2024-05-02 NOTE — Progress Notes (Signed)
 PHARMACY CONSULT NOTE - FOLLOW UP  Pharmacy Consult for Electrolyte Monitoring and Replacement   Recent Labs: Potassium (mmol/L)  Date Value  05/02/2024 2.6 (LL)   Magnesium  (mg/dL)  Date Value  87/91/7974 2.1   Calcium  (mg/dL)  Date Value  87/91/7974 6.2 (LL)   Albumin (g/dL)  Date Value  87/92/7974 2.9 (L)  09/25/2015 4.4   Phosphorus (mg/dL)  Date Value  87/91/7974 2.6   Sodium (mmol/L)  Date Value  05/02/2024 141  09/25/2015 142   Assessment: 78 yo F with PMH including chronic diarrhea and electrolyte imbalances presenting with abdominal pain.  Medications: Calcium  carbonate 1250mg  BID, losartan , KCL 40 meq po TID  Goal of Therapy:  Electrolytes WNL   Plan: K 2.6 >> while on active order for 40mEq PO TID. Will give additional  K10 meq IVPB x4 doses Will check next K at 0330 AM (2 hours after last infusion) Check electrolytes with AM labs  Lisa [name], PharmD Clinical Pharmacist 05/02/2024 8:03 PM

## 2024-05-02 NOTE — Assessment & Plan Note (Signed)
 Appreciate ophthalmology evaluation.  Will need close follow-up as outpatient.  Continue eyedrops.

## 2024-05-02 NOTE — Progress Notes (Addendum)
 called back to see patient secondary to shortness of breath and  not feeling well.  Patient having continuous diarrhea.  Will increase Imodium  up to 4 mg every 6 hours.  Given a dose of Lasix .  Will stop potassium replacement IV now.Chest x-ray looks like some fluid.  Physical exam.  Lungs with rhonchi bilateral bases.  Updated brother on the phone.  Brother states that the patient eats a lot of junk food.  Dr. Charlie Patterson 15 minutes

## 2024-05-02 NOTE — Plan of Care (Signed)
  Problem: Fluid Volume: Goal: Ability to maintain a balanced intake and output will improve Outcome: Progressing   Problem: Metabolic: Goal: Ability to maintain appropriate glucose levels will improve Outcome: Progressing   Problem: Skin Integrity: Goal: Risk for impaired skin integrity will decrease Outcome: Progressing   Problem: Clinical Measurements: Goal: Ability to maintain clinical measurements within normal limits will improve Outcome: Progressing

## 2024-05-03 DIAGNOSIS — I5033 Acute on chronic diastolic (congestive) heart failure: Secondary | ICD-10-CM

## 2024-05-03 LAB — BLOOD CULTURE ID PANEL (REFLEXED) - BCID2

## 2024-05-03 LAB — PHOSPHORUS: Phosphorus: 2.6 mg/dL (ref 2.5–4.6)

## 2024-05-03 LAB — CHROMOGRANIN A: Chromogranin A (ng/mL): 693.7 ng/mL — ABNORMAL HIGH (ref 0.0–101.8)

## 2024-05-03 LAB — CBC
HCT: 34.3 % — ABNORMAL LOW (ref 36.0–46.0)
Hemoglobin: 11.5 g/dL — ABNORMAL LOW (ref 12.0–15.0)
MCH: 28 pg (ref 26.0–34.0)
MCHC: 33.5 g/dL (ref 30.0–36.0)
MCV: 83.7 fL (ref 80.0–100.0)
Platelets: 258 K/uL (ref 150–400)
RBC: 4.1 MIL/uL (ref 3.87–5.11)
RDW: 19.3 % — ABNORMAL HIGH (ref 11.5–15.5)
WBC: 10.8 K/uL — ABNORMAL HIGH (ref 4.0–10.5)
nRBC: 0 % (ref 0.0–0.2)

## 2024-05-03 LAB — BASIC METABOLIC PANEL WITH GFR
Anion gap: 12 (ref 5–15)
BUN: 9 mg/dL (ref 8–23)
CO2: 31 mmol/L (ref 22–32)
Calcium: 7.3 mg/dL — ABNORMAL LOW (ref 8.9–10.3)
Chloride: 100 mmol/L (ref 98–111)
Creatinine, Ser: 0.55 mg/dL (ref 0.44–1.00)
GFR, Estimated: >60 mL/min (ref >60)
Glucose, Bld: 141 mg/dL — ABNORMAL HIGH (ref 70–99)
Potassium: 2.9 mmol/L — ABNORMAL LOW (ref 3.5–5.1)
Sodium: 143 mmol/L (ref 135–145)

## 2024-05-03 LAB — GLUCOSE, CAPILLARY
Glucose-Capillary: 239 mg/dL — ABNORMAL HIGH (ref 70–99)
Glucose-Capillary: 232 mg/dL — ABNORMAL HIGH (ref 70–99)
Glucose-Capillary: 246 mg/dL — ABNORMAL HIGH (ref 70–99)
Glucose-Capillary: 278 mg/dL — ABNORMAL HIGH (ref 70–99)

## 2024-05-03 LAB — MAGNESIUM: Magnesium: 1.8 mg/dL (ref 1.7–2.4)

## 2024-05-03 MED ORDER — SODIUM CHLORIDE 0.9 % IV SOLN
2.0000 g | INTRAVENOUS | Status: DC
  Administered 2024-05-03 – 2024-05-06 (×4): 2 g via INTRAVENOUS
  Filled 2024-05-03 (×4): qty 20

## 2024-05-03 MED ORDER — DOXYCYCLINE HYCLATE 100 MG PO TABS
100.0000 mg | ORAL_TABLET | Freq: Two times a day (BID) | ORAL | Status: DC
  Administered 2024-05-03 – 2024-05-07 (×9): 100 mg via ORAL
  Filled 2024-05-03 (×9): qty 1

## 2024-05-03 MED ORDER — POTASSIUM CHLORIDE 10 MEQ/100ML IV SOLN
10.0000 meq | INTRAVENOUS | Status: AC
  Administered 2024-05-03 (×3): 10 meq via INTRAVENOUS
  Filled 2024-05-03 (×3): qty 100

## 2024-05-03 MED ORDER — SPIRONOLACTONE 25 MG PO TABS
25.0000 mg | ORAL_TABLET | Freq: Every day | ORAL | Status: DC
  Administered 2024-05-03 – 2024-05-07 (×5): 25 mg via ORAL
  Filled 2024-05-03 (×5): qty 1

## 2024-05-03 MED ORDER — POTASSIUM CHLORIDE 10 MEQ/100ML IV SOLN
10.0000 meq | Freq: Once | INTRAVENOUS | Status: AC
  Administered 2024-05-03: 10 meq via INTRAVENOUS
  Filled 2024-05-03: qty 100

## 2024-05-03 NOTE — Progress Notes (Signed)
 PHARMACY CONSULT NOTE - FOLLOW UP  Pharmacy Consult for Electrolyte Monitoring and Replacement   Recent Labs: Potassium (mmol/L)  Date Value  05/03/2024 2.9 (L)   Magnesium  (mg/dL)  Date Value  87/90/7974 1.8   Calcium  (mg/dL)  Date Value  87/90/7974 7.3 (L)   Albumin (g/dL)  Date Value  87/92/7974 2.9 (L)  09/25/2015 4.4   Phosphorus (mg/dL)  Date Value  87/90/7974 2.6   Sodium (mmol/L)  Date Value  05/03/2024 143  09/25/2015 142   Assessment: 78 yo F with PMH including chronic diarrhea and electrolyte imbalances presenting with abdominal pain.  Medications: Calcium  carbonate 1250mg  BID, losartan , KCL 40 meq po TID  Goal of Therapy:  Electrolytes WNL   Plan: K 2.9 >> while on active order for 40mEq PO TID. Will give additional  K10 meq IVPB x4 doses Will check next K w/ next AM labs Check electrolytes with AM labs  Rankin CANDIE Dills, PharmD, [facility] 05/03/2024 5:13 AM

## 2024-05-03 NOTE — Progress Notes (Signed)
 Patient refused to have PIV placed.  IV nurse and I both spoke with her.  Dr. Josette notified and stated that if patients potassium remains low she will need to have a new IV placed.

## 2024-05-03 NOTE — Assessment & Plan Note (Signed)
 Physical therapy recommending rehab

## 2024-05-03 NOTE — Plan of Care (Signed)

## 2024-05-03 NOTE — Progress Notes (Addendum)
 Progress Note   Patient: Lisa [name] FMW:[id number] DOB: 1945/12/23 DOA: 04/30/2024     3 DOS: the patient was seen and examined on 05/03/2024   Brief hospital course: 78 y.o. female with medical history significant for chronic functional diarrhea, chronic electrolyte imbalance, prior history of DVT not on anticoagulation, HTN, HLD, CHF, DM, depression who was recently at [facility] between 03/14/2024 and 03/29/2024 for abdominal pain.  At that time she underwent EGD which showed a small hiatal hernia, normal stomach, normal examined duodenum.  S/p colonoscopy showed a large amount of stool in the entire colon precluding visualization, biopsy taken, colon preparation was poor.  Pathology showed duodenal mucosa with villous architecture.  Colonic mucosa was unremarkable and was negative for microscopic colitis, active inflammation and chronic changes.  If biopsies are known diagnostic, patient will need another colonoscopy outpatient with better preparation as per GI.  Patient did not follow through with colonoscopy nor fecal calprotectin study 06/2022.  Patient was started on scheduled Imodium  per GI recommendation that time.   Today patient came into the hospital complaining of lysed weakness at home to the point that she had to use walker.  She says she has been having usual diarrhea no problem.  She has no problem eating.  She denies any other symptoms.   ED Course: Upon arrival to the ED, patient is found to be severely low potassium at less than 2.0, low calcium  at 6.4, high anion gap at 17, bicarb was 32.  White count was normal.  Patient denied any other symptoms.  Patient was replaced with potassium and calcium  and hospital service was consulted for evaluation for admission for generalized weakness and electrolyte abnormalities.  12/7.  Patient states that she has had diarrhea since August.  Coming in with electrolyte abnormalities and diarrhea.  Case discussed with gastroenterology and may have  an overflow constipation and pancreatic insufficiency will try creon . 12/8.  Patient complains of blurry vision today.  She states she is blind in her right eye and has blurry vision in her left eye.  Patient able to move her legs better today.  Still having some diarrhea. 12/9.  Patient feeling a little bit better.  Blurry vision little less.  Will follow-up with eye doctor upon discharge in a week.  Still having diarrhea.  Still with low potassium 2.9.  Assessment and Plan: * Hypokalemia Severe hypokalemia on presentation.  With aggressive supplementation potassium still 2.9 today.  Oral potassium 3 times today and IV potassium runs.  Will start on Aldactone .  Potassium-sensitive periodic paralysis Patient able to move her legs better today than yesterday.  PT evaluation recommending rehab  Hypomagnesemia Replaced aggressively on presentation..  Magnesium  normal range today.  Acute on chronic diastolic CHF (congestive heart failure) (HCC) Patient needed a dose of Lasix  on the evening of 12/8 with aggressive IV electrolyte supplementation.  Patient on Toprol  and losartan .  Add Aldactone .  Chronic diarrhea of unknown origin Could be secondary to overflow constipation.  With electrolyte abnormalities will monitor today.  Could be secondary to pancreatic insufficiency with pancreatic elastase level.  Will give a trial of Creon .  Increased Imodium  dose to 4 mg every 6 hours as needed.  Fever Possible pneumonia.  Will start Rocephin  and Zithromax.  Staph species in blood culture likely contaminant.  Sinus bradycardia On Toprol -XL  Hyperlipidemia, unspecified On Crestor   Generalized weakness Physical therapy recommending rehab  Blurry vision, left eye Appreciate ophthalmology evaluation.  Will need close follow-up as outpatient.  Continue  eyedrops.  Hypocalcemia Replaced IV previously continue oral supplementation.  Mediastinal mass Seen on imaging back in 2024 with benign  appearance.  Follow-up as outpatient  Uncontrolled type 2 diabetes mellitus with hypoglycemia, without long-term current use of insulin  (HCC) Discontinue sliding scale since last hemoglobin A1c 6.4.  Initially on D10 drip.  Tobacco abuse Stop smoking  Benign essential HTN On Norvasc  and metoprolol  losartan  and add Aldactone .        Subjective: Patient feeling little bit better today.  Breathing better.  Still having some diarrhea.  Patient is a little bit better.  Admitted with weakness and severe electrolyte abnormalities.  Physical Exam: Vitals:   05/03/24 0525 05/03/24 0527 05/03/24 0648 05/03/24 0745  BP: (!) 211/85 (!) 204/92 (!) 182/62 (!) 132/50  Pulse: 70 67 100 65  Resp: 20     Temp: 97.8 F (36.6 C)   98 F (36.7 C)  TempSrc:    Oral  SpO2: 100% 99%  94%  Weight:      Height:       Physical Exam HENT:     Head: Normocephalic.  Eyes:     General: Lids are normal.     Conjunctiva/sclera: Conjunctivae normal.  Cardiovascular:     Rate and Rhythm: Normal rate and regular rhythm.     Heart sounds: S1 normal and S2 normal. Murmur heard.     Systolic murmur is present with a grade of 2/6.  Pulmonary:     Breath sounds: Examination of the right-lower field reveals decreased breath sounds. Examination of the left-lower field reveals decreased breath sounds. Decreased breath sounds present. No wheezing, rhonchi or rales.  Abdominal:     Palpations: Abdomen is soft.     Tenderness: There is no abdominal tenderness.  Musculoskeletal:     Right lower leg: No swelling.     Left lower leg: No swelling.  Skin:    General: Skin is warm.     Findings: No rash.  Neurological:     Mental Status: She is alert and oriented to person, place, and time.     Comments: Patient able to lift her legs up off the bed.     Data Reviewed: Potassium 2.9, magnesium  1.8, phosphorus 2.6, calcium  7.3, GFR greater than 60 with a creatinine of 0.55, white blood count 10.8, hemoglobin  11.5, platelet count 258  Family Communication: updated brother on phone  Disposition: Status is: Inpatient Remains inpatient appropriate because: Patient still with low potassium.  Possibility of pneumonia seen on chest x-ray with a fever yesterday and I will give empiric antibiotic.  Still having diarrhea.  Planned Discharge Destination: Physical therapy recommending rehab    Time spent: 28 minutes  Author: Charlie Patterson, MD 05/03/2024 12:38 PM  For on call review [email or web address].

## 2024-05-03 NOTE — Progress Notes (Signed)
 PHARMACY - PHYSICIAN COMMUNICATION CRITICAL VALUE ALERT - BLOOD CULTURE IDENTIFICATION (BCID)  Results for orders placed or performed during the hospital encounter of 04/30/24  C Difficile Quick Screen w PCR reflex     Status: None   Collection Time: 05/01/24  7:22 AM   Specimen: STOOL  Result Value Ref Range Status   C Diff antigen NEGATIVE NEGATIVE Final   C Diff toxin NEGATIVE NEGATIVE Final   C Diff interpretation No C. difficile detected.  Final    Comment: Performed at [facility], [address] [place], [place] [address]  Gastrointestinal Panel by PCR , Stool     Status: None   Collection Time: 05/01/24  7:22 AM   Specimen: Stool  Result Value Ref Range Status   Campylobacter species NOT DETECTED NOT DETECTED Final   Plesimonas shigelloides NOT DETECTED NOT DETECTED Final   Salmonella species NOT DETECTED NOT DETECTED Final   Yersinia enterocolitica NOT DETECTED NOT DETECTED Final   Vibrio species NOT DETECTED NOT DETECTED Final   Vibrio cholerae NOT DETECTED NOT DETECTED Final   Enteroaggregative E coli (EAEC) NOT DETECTED NOT DETECTED Final   Enteropathogenic E coli (EPEC) NOT DETECTED NOT DETECTED Final   Enterotoxigenic E coli (ETEC) NOT DETECTED NOT DETECTED Final   Shiga like toxin producing E coli (STEC) NOT DETECTED NOT DETECTED Final   Shigella/Enteroinvasive E coli (EIEC) NOT DETECTED NOT DETECTED Final   Cryptosporidium NOT DETECTED NOT DETECTED Final   Cyclospora cayetanensis NOT DETECTED NOT DETECTED Final   Entamoeba histolytica NOT DETECTED NOT DETECTED Final   Giardia lamblia NOT DETECTED NOT DETECTED Final   Adenovirus F40/41 NOT DETECTED NOT DETECTED Final   Astrovirus NOT DETECTED NOT DETECTED Final   Norovirus GI/GII NOT DETECTED NOT DETECTED Final   Rotavirus A NOT DETECTED NOT DETECTED Final   Sapovirus (I, II, IV, and V) NOT DETECTED NOT DETECTED Final    Comment: Performed at [facility], [address] [place], [place]  [address]  Culture, blood (Routine X 2) w Reflex to ID Panel     Status: None (Preliminary result)   Collection Time: 05/02/24  1:51 PM   Specimen: BLOOD  Result Value Ref Range Status   Specimen Description BLOOD BLOOD LEFT HAND  Final   Special Requests   Final    BOTTLES DRAWN AEROBIC AND ANAEROBIC Blood Culture adequate volume   Culture  Setup Time   Final    GRAM POSITIVE COCCI ANAEROBIC BOTTLE ONLY Organism ID to follow CRITICAL RESULT CALLED TO, READ BACK BY AND VERIFIED WITHBETHA LOT Sharma Lawrance AT 0540 05/03/24 JG Performed at [facility], [address] [place], [place] [address]    Culture PENDING  Incomplete   Report Status PENDING  Incomplete  Blood Culture ID Panel (Reflexed)     Status: Abnormal   Collection Time: 05/02/24  1:51 PM  Result Value Ref Range Status   Enterococcus faecalis NOT DETECTED NOT DETECTED Final   Enterococcus Faecium NOT DETECTED NOT DETECTED Final   Listeria monocytogenes NOT DETECTED NOT DETECTED Final   Staphylococcus species DETECTED (A) NOT DETECTED Final    Comment: CRITICAL RESULT CALLED TO, READ BACK BY AND VERIFIED WITH:  Josuha Fontanez AT 0540 05/03/24 JG    Staphylococcus aureus (BCID) NOT DETECTED NOT DETECTED Final   Staphylococcus epidermidis NOT DETECTED NOT DETECTED Final   Staphylococcus lugdunensis NOT DETECTED NOT DETECTED Final   Streptococcus species NOT DETECTED NOT DETECTED Final   Streptococcus agalactiae NOT  DETECTED NOT DETECTED Final   Streptococcus pneumoniae NOT DETECTED NOT DETECTED Final   Streptococcus pyogenes NOT DETECTED NOT DETECTED Final   A.calcoaceticus-baumannii NOT DETECTED NOT DETECTED Final   Bacteroides fragilis NOT DETECTED NOT DETECTED Final   Enterobacterales NOT DETECTED NOT DETECTED Final   Enterobacter cloacae complex NOT DETECTED NOT DETECTED Final   Escherichia coli NOT DETECTED NOT DETECTED Final   Klebsiella aerogenes NOT DETECTED NOT DETECTED Final   Klebsiella oxytoca NOT DETECTED NOT  DETECTED Final   Klebsiella pneumoniae NOT DETECTED NOT DETECTED Final   Proteus species NOT DETECTED NOT DETECTED Final   Salmonella species NOT DETECTED NOT DETECTED Final   Serratia marcescens NOT DETECTED NOT DETECTED Final   Haemophilus influenzae NOT DETECTED NOT DETECTED Final   Neisseria meningitidis NOT DETECTED NOT DETECTED Final   Pseudomonas aeruginosa NOT DETECTED NOT DETECTED Final   Stenotrophomonas maltophilia NOT DETECTED NOT DETECTED Final   Candida albicans NOT DETECTED NOT DETECTED Final   Candida auris NOT DETECTED NOT DETECTED Final   Candida glabrata NOT DETECTED NOT DETECTED Final   Candida krusei NOT DETECTED NOT DETECTED Final   Candida parapsilosis NOT DETECTED NOT DETECTED Final   Candida tropicalis NOT DETECTED NOT DETECTED Final   Cryptococcus neoformans/gattii NOT DETECTED NOT DETECTED Final    Comment: Performed at [facility], [address] [place], [place] [address]    BCID Results: 1 (anaerobic) of 4 bottles with Staph species, no resistance.  WBC now 10.8 and max temp 100.9 in past 24 hrs.  Pt is not currently on any abx.  Name of provider contacted: HILARIO Solian, MD   Changes to prescribed antibiotics required: No changes at this time, pending additional BCID results.  Rankin CANDIE Dills, PharmD, [facility] 05/03/2024 6:09 AM

## 2024-05-03 NOTE — Progress Notes (Signed)
 At bedside for PIV insertion. Pt refusing any further PIV insertions at this time. RN made aware and will report to provider.

## 2024-05-03 NOTE — Assessment & Plan Note (Signed)
 Patient needed a dose of Lasix  on the evening of 12/8 with aggressive IV electrolyte supplementation.  Patient on Toprol  and losartan .  Add Aldactone .

## 2024-05-03 NOTE — Progress Notes (Signed)
   05/03/24 1900  Spiritual Encounters  Type of Visit Initial  Care provided to: Patient  Referral source Other (comment) (Spiritual Consult)  Reason for visit Routine spiritual support  OnCall Visit Yes  Interventions  Spiritual Care Interventions Made Established relationship of care and support;Compassionate presence;Prayer  Intervention Outcomes  Outcomes Connection to spiritual care   Patient expressed being very tired. They provide and give to others often, but not many take care of them. Chaplain gave compassionate care through listening and encouraging the patient. Patient expressed an interest in receiving therapy to process all the things they are feeling.

## 2024-05-04 ENCOUNTER — Inpatient Hospital Stay

## 2024-05-04 DIAGNOSIS — E876 Hypokalemia: Secondary | ICD-10-CM | POA: Diagnosis not present

## 2024-05-04 DIAGNOSIS — J90 Pleural effusion, not elsewhere classified: Secondary | ICD-10-CM | POA: Diagnosis not present

## 2024-05-04 DIAGNOSIS — J9859 Other diseases of mediastinum, not elsewhere classified: Secondary | ICD-10-CM | POA: Diagnosis not present

## 2024-05-04 DIAGNOSIS — R197 Diarrhea, unspecified: Secondary | ICD-10-CM | POA: Diagnosis not present

## 2024-05-04 LAB — GLUCOSE, CAPILLARY
Glucose-Capillary: 143 mg/dL — ABNORMAL HIGH (ref 70–99)
Glucose-Capillary: 190 mg/dL — ABNORMAL HIGH (ref 70–99)
Glucose-Capillary: 181 mg/dL — ABNORMAL HIGH (ref 70–99)
Glucose-Capillary: 263 mg/dL — ABNORMAL HIGH (ref 70–99)

## 2024-05-04 LAB — BASIC METABOLIC PANEL WITH GFR
Anion gap: 7 (ref 5–15)
BUN: 15 mg/dL (ref 8–23)
CO2: 30 mmol/L (ref 22–32)
Calcium: 7.7 mg/dL — ABNORMAL LOW (ref 8.9–10.3)
Chloride: 102 mmol/L (ref 98–111)
Creatinine, Ser: 0.77 mg/dL (ref 0.44–1.00)
GFR, Estimated: >60 mL/min (ref >60)
Glucose, Bld: 166 mg/dL — ABNORMAL HIGH (ref 70–99)
Potassium: 3.8 mmol/L (ref 3.5–5.1)
Sodium: 139 mmol/L (ref 135–145)

## 2024-05-04 LAB — OSMOLALITY, STOOL

## 2024-05-04 LAB — MAGNESIUM: Magnesium: 1.6 mg/dL — ABNORMAL LOW (ref 1.7–2.4)

## 2024-05-04 MED ORDER — IOHEXOL 300 MG/ML  SOLN
100.0000 mL | Freq: Once | INTRAMUSCULAR | Status: AC | PRN
  Administered 2024-05-04: 100 mL via INTRAVENOUS

## 2024-05-04 MED ORDER — IOHEXOL 9 MG/ML PO SOLN
500.0000 mL | ORAL | Status: AC
  Administered 2024-05-04 (×2): 500 mL via ORAL

## 2024-05-04 MED ORDER — MAGNESIUM SULFATE 2 GM/50ML IV SOLN
2.0000 g | Freq: Once | INTRAVENOUS | Status: AC
  Administered 2024-05-04: 2 g via INTRAVENOUS
  Filled 2024-05-04: qty 50

## 2024-05-04 MED ORDER — DIPHENOXYLATE-ATROPINE 2.5-0.025 MG PO TABS
2.0000 | ORAL_TABLET | Freq: Four times a day (QID) | ORAL | Status: DC | PRN
  Administered 2024-05-05: 2 via ORAL
  Filled 2024-05-04 (×2): qty 2

## 2024-05-04 NOTE — Progress Notes (Signed)
 Physical Therapy Treatment Patient Details Name: Brennley Curtice MRN: [id number] DOB: July 10, 1945 Today's Date: 05/04/2024   History of Present Illness presented to ER secondary to progressive weakness, persistent diarrhead; admitted for management of severe electrolyte abnormalities.  Of note, recent hospitalization 10/20-11/4 secondary to abdominal pain; similar course.  GI involved.    PT Comments  Marked improvement in respiratory status and overall activity tolerance this date.  Able to progress to standing and OOB/chair activities, requiring RW and min/mod assist from therapist to complete.  Patient somewhat impulsive at times and generally self-directive of session, but redirectable as appropriate.   If plan is discharge home, recommend the following: A lot of help with walking and/or transfers;A lot of help with bathing/dressing/bathroom   Can travel by private vehicle     Yes  Equipment Recommendations       Recommendations for Other Services       Precautions / Restrictions Precautions Precautions: Fall Recall of Precautions/Restrictions: Intact Restrictions Weight Bearing Restrictions Per Provider Order: No     Mobility  Bed Mobility Overal bed mobility: Needs Assistance Bed Mobility: Supine to Sit     Supine to sit: Contact guard          Transfers Overall transfer level: Needs assistance Equipment used: Rolling walker (2 wheels) Transfers: Sit to/from Stand, Bed to chair/wheelchair/BSC Sit to Stand: Min assist, Mod assist Stand pivot transfers: Min assist         General transfer comment: min/mod assist for initial sit/stand from bed surface; does improve to min assist with repetition and various seating surfaces (BSC, recliner)    Ambulation/Gait               General Gait Details: deferred due to incontinence, toileting needs during session   Stairs             Wheelchair Mobility     Tilt Bed    Modified Rankin  (Stroke Patients Only)       Balance Overall balance assessment: Needs assistance Sitting-balance support: No upper extremity supported Sitting balance-Leahy Scale: Good     Standing balance support: Bilateral upper extremity supported Standing balance-Leahy Scale: Fair Standing balance comment: posterior LOB x1, environmental support/assist from therapist to correct                            Communication Communication Communication: No apparent difficulties  Cognition Arousal: Alert Behavior During Therapy: WFL for tasks assessed/performed                           PT - Cognition Comments: notable confusion intermittently throughout session Following commands: Intact      Cueing Cueing Techniques: Verbal cues  Exercises Other Exercises Other Exercises: toilet transfer, SPT with RW, min assist; sit/stand from BSC, min assist; standing balance wiht RW, min assist during peri-care Other Exercises: Patient with frequent cuing for safety, task sequencing throughout session    General Comments        Pertinent Vitals/Pain Pain Assessment Pain Assessment: No/denies pain    Home Living                          Prior Function            PT Goals (current goals can now be found in the care plan section) Acute Rehab PT Goals Patient Stated Goal: to get better  PT Goal Formulation: With patient Time For Goal Achievement: 05/16/24 Potential to Achieve Goals: Good Progress towards PT goals: Progressing toward goals    Frequency    Min 2X/week      PT Plan      Co-evaluation PT/OT/SLP Co-Evaluation/Treatment: Yes   PT goals addressed during session: Mobility/safety with mobility OT goals addressed during session: ADL's and self-care      AM-PAC PT 6 Clicks Mobility   Outcome Measure  Help needed turning from your back to your side while in a flat bed without using bedrails?: A Little Help needed moving from lying on  your back to sitting on the side of a flat bed without using bedrails?: A Little Help needed moving to and from a bed to a chair (including a wheelchair)?: A Little Help needed standing up from a chair using your arms (e.g., wheelchair or bedside chair)?: A Little Help needed to walk in hospital room?: A Lot Help needed climbing 3-5 steps with a railing? : A Lot 6 Click Score: 16    End of Session   Activity Tolerance: Patient tolerated treatment well Patient left: in chair;with call bell/phone within reach;with chair alarm set Nurse Communication: Mobility status PT Visit Diagnosis: Muscle weakness (generalized) (M62.81);Difficulty in walking, not elsewhere classified (R26.2)     Time: 8480-8445 PT Time Calculation (min) (ACUTE ONLY): 35 min  Charges:    $Therapeutic Activity: 8-22 mins PT General Charges $$ ACUTE PT VISIT: 1 Visit                     Eileene Kisling H. Delores, PT, DPT, NCS 05/04/24, 11:10 PM [phone]

## 2024-05-04 NOTE — Progress Notes (Signed)
 Occupational Therapy Treatment Patient Details Name: Lisa [name] MRN: [id number] DOB: 24-Nov-1945 Today's Date: 05/04/2024   History of present illness presented to ER secondary to progressive weakness, persistent diarrhead; admitted for management of severe electrolyte abnormalities.  Of note, recent hospitalization 10/20-11/4 secondary to abdominal pain; similar course.  GI involved.   OT comments  Patient seen for OT treatment on this date. Upon arrival to room patient resting in bed, agreeable to treatment. Patient transitioned to EOB with min A; performed SPT with cues for hand placement and body mechanics. Patient had bladder incontinence, patient attempted to catch urine in hands, required A for incontinence care; required increased time as patient also drinking contrast for upcoming CT scan. Patient ended treatment in recliner with chair alarm on and all needs within reach. Patient making good progress toward goals, will continue to follow POC. Discharge recommendation remains appropriate.        If plan is discharge home, recommend the following:  Two people to help with walking and/or transfers;Two people to help with bathing/dressing/bathroom;Help with stairs or ramp for entrance   Equipment Recommendations  Other (comment) (defer to next venue)    Recommendations for Other Services      Precautions / Restrictions Precautions Precautions: Fall Recall of Precautions/Restrictions: Intact Restrictions Weight Bearing Restrictions Per Provider Order: No       Mobility Bed Mobility Overal bed mobility: Needs Assistance Bed Mobility: Supine to Sit     Supine to sit: Min assist     General bed mobility comments: able to transition to EOB with CGA, cues for pacing    Transfers Overall transfer level: Needs assistance Equipment used: Rolling walker (2 wheels) Transfers: Bed to chair/wheelchair/BSC       Step pivot transfers: Min assist     General  transfer comment: patient performed transfer from EOB to recliner, recliner to BSC, BSC to recliner; min A for lifting from EOB; steadying A/CGA during transfer, cues for hand placement for transfers     Balance Overall balance assessment: Needs assistance Sitting-balance support: No upper extremity supported, Feet supported Sitting balance-Leahy Scale: Fair     Standing balance support: During functional activity Standing balance-Leahy Scale: Fair Standing balance comment: near LOB, corrected by therapy                           ADL either performed or assessed with clinical judgement   ADL Overall ADL's : Needs assistance/impaired             Lower Body Bathing: Sit to/from stand;Minimal assistance Lower Body Bathing Details (indicate cue type and reason): able to perform pericare while standing with steadying A and cues for hand placement     Lower Body Dressing: Maximal assistance;Sit to/from stand Lower Body Dressing Details (indicate cue type and reason): A to doff/don socks, A to thread BLE into underpants; able to pull up underpants while standing with steadying A Toilet Transfer: Minimal assistance;BSC/3in1;Rolling walker (2 wheels) Toilet Transfer Details (indicate cue type and reason): patient performed SPT from EOB to [facility] with cues for hand placement and A to manage O2 tubing Toileting- Clothing Manipulation and Hygiene: Minimal assistance;Sit to/from stand         General ADL Comments: incontinent of urine during transfer, attempted to catch urine in hand, increased A required for incontinence care    Extremity/Trunk Assessment              Vision  Perception     Praxis     Communication Communication Communication: No apparent difficulties   Cognition Arousal: Alert Behavior During Therapy: WFL for tasks assessed/performed Cognition: No apparent impairments                               Following commands: Intact         Cueing   Cueing Techniques: Verbal cues  Exercises      Shoulder Instructions       General Comments      Pertinent Vitals/ Pain       Pain Assessment Pain Assessment: No/denies pain  Home Living                                          Prior Functioning/Environment              Frequency  Min 2X/week        Progress Toward Goals  OT Goals(current goals can now be found in the care plan section)  Progress towards OT goals: Progressing toward goals  Acute Rehab OT Goals Patient Stated Goal: to go home OT Goal Formulation: With patient Time For Goal Achievement: 05/16/24 Potential to Achieve Goals: Good ADL Goals Pt Will Perform Grooming: with min assist;sitting Pt Will Perform Lower Body Dressing: with min assist;sit to/from stand Pt Will Transfer to Toilet: with contact guard assist;bedside commode  Plan      Co-evaluation    PT/OT/SLP Co-Evaluation/Treatment: Yes Reason for Co-Treatment: Complexity of the patient's impairments (multi-system involvement);Necessary to address cognition/behavior during functional activity;For patient/therapist safety;To address functional/ADL transfers PT goals addressed during session: Mobility/safety with mobility OT goals addressed during session: ADL's and self-care      AM-PAC OT 6 Clicks Daily Activity     Outcome Measure   Help from another person eating meals?: None Help from another person taking care of personal grooming?: A Little Help from another person toileting, which includes using toliet, bedpan, or urinal?: A Little Help from another person bathing (including washing, rinsing, drying)?: A Lot Help from another person to put on and taking off regular upper body clothing?: A Little Help from another person to put on and taking off regular lower body clothing?: A Lot 6 Click Score: 17    End of Session Equipment Utilized During Treatment: Rolling walker (2  wheels);Oxygen  OT Visit Diagnosis: Unsteadiness on feet (R26.81);Other abnormalities of gait and mobility (R26.89);Muscle weakness (generalized) (M62.81)   Activity Tolerance Patient tolerated treatment well   Patient Left in chair;with call bell/phone within reach;with chair alarm set   Nurse Communication Mobility status        Time: 8480-8445 OT Time Calculation (min): 35 min  Charges: OT General Charges $OT Visit: 1 Visit OT Treatments $Self Care/Home Management : 8-22 mins  Rogers Clause, OT/L MSOT, 05/04/2024

## 2024-05-04 NOTE — Plan of Care (Signed)
°  Problem: Education: Goal: Ability to describe self-care measures that may prevent or decrease complications (Diabetes Survival Skills Education) will improve Outcome: Progressing   Problem: Coping: Goal: Ability to adjust to condition or change in health will improve Outcome: Progressing   Problem: Fluid Volume: Goal: Ability to maintain a balanced intake and output will improve Outcome: Progressing   Problem: Metabolic: Goal: Ability to maintain appropriate glucose levels will improve Outcome: Progressing   Problem: Nutritional: Goal: Maintenance of adequate nutrition will improve Outcome: Progressing   Problem: Skin Integrity: Goal: Risk for impaired skin integrity will decrease Outcome: Progressing   Problem: Tissue Perfusion: Goal: Adequacy of tissue perfusion will improve Outcome: Progressing   Problem: Activity: Goal: Risk for activity intolerance will decrease Outcome: Progressing   Problem: Nutrition: Goal: Adequate nutrition will be maintained Outcome: Progressing   Problem: Pain Managment: Goal: General experience of comfort will improve and/or be controlled Outcome: Progressing   Problem: Safety: Goal: Ability to remain free from injury will improve Outcome: Progressing   Problem: Skin Integrity: Goal: Risk for impaired skin integrity will decrease Outcome: Progressing

## 2024-05-04 NOTE — Care Management Important Message (Signed)
 Important Message  Patient Details  Name: Spruha Weight MRN: [id number] Date of Birth: [birth year]   Important Message Given:  Yes - Medicare IM     Oliviah Agostini W, CMA 05/04/2024, 12:33 PM

## 2024-05-04 NOTE — TOC Initial Note (Signed)
 Transition of Care [facility]) - Initial/Assessment Note    Patient Details  Name: Lisa [name] MRN: [id number] Date of Birth: 02/23/1946  Transition of Care [facility]) CM/SW Contact:    Nathanael CHRISTELLA Ring, RN Phone Number: 05/04/2024, 2:34 PM  Clinical Narrative:                 CM met with patient at the bedside, introduced self and explained role in discharge planning.  She is alert and oriented.  She is from home where she lives alone and is independent.  She does not drive, she has a friend that will take her to appointments and shopping but she usually doesn't go anywhere.  She is wanting to downgrade and move into a community, like a community for seniors.  CM will provide her with some resources like [facility] and [facility].  Therapy is recommending SNF, she is agreeable but only to go to [facility].  Will send a referral to [facility].   Expected Discharge Plan: Skilled Nursing Facility Barriers to Discharge: Continued Medical Work up   Patient Goals and CMS Choice Patient states their goals for this hospitalization and ongoing recovery are:: Okay with going to rehab but will only go to [email or web address] Compare Post Acute Care list provided to:: Patient Choice offered to / list presented to : Patient      Expected Discharge Plan and Services   Discharge Planning Services: CM Consult Post Acute Care Choice: Skilled Nursing Facility Living arrangements for the past 2 months: Single Family Home                 DME Arranged: N/A         HH Arranged: NA          Prior Living Arrangements/Services Living arrangements for the past 2 months: Single Family Home Lives with:: Self Patient language and need for interpreter reviewed:: Yes Do you feel safe going back to the place where you live?: Yes      Need for Family Participation in Patient Care: Yes (Comment) Care giver support system in place?: Yes (comment)   Criminal Activity/Legal  Involvement Pertinent to Current Situation/Hospitalization: No - Comment as needed  Activities of Daily Living   ADL Screening (condition at time of admission) Independently performs ADLs?: No Does the patient have a NEW difficulty with bathing/dressing/toileting/self-feeding that is expected to last >3 days?: Yes (Initiates electronic notice to provider for possible OT consult) Does the patient have a NEW difficulty with getting in/out of bed, walking, or climbing stairs that is expected to last >3 days?: Yes (Initiates electronic notice to provider for possible PT consult) Does the patient have a NEW difficulty with communication that is expected to last >3 days?: No Is the patient deaf or have difficulty hearing?: No Does the patient have difficulty seeing, even when wearing glasses/contacts?: No Does the patient have difficulty concentrating, remembering, or making decisions?: No  Permission Sought/Granted         Permission granted to share info w AGENCY: [employer]        Emotional Assessment Appearance:: Appears stated age Attitude/Demeanor/Rapport: Engaged Affect (typically observed): Accepting Orientation: : Oriented to Self, Oriented to Place, Oriented to  Time, Oriented to Situation Alcohol  / Substance Use: Not Applicable Psych Involvement: No (comment)  Admission diagnosis:  Hypocalcemia [E83.51] Hypokalemia [E87.6] Hypomagnesemia [E83.42] Generalized weakness [R53.1] Patient Active Problem List   Diagnosis Date Noted   Acute on chronic diastolic CHF (congestive  heart failure) (HCC) 05/03/2024   Fever 05/02/2024   Blurry vision, left eye 05/02/2024   Generalized weakness 05/02/2024   Shortness of breath 05/02/2024   Potassium-sensitive periodic paralysis 05/01/2024   Uncontrolled type 2 diabetes mellitus with hypoglycemia, without long-term current use of insulin  (HCC) 05/01/2024   Mediastinal mass 05/01/2024   Hypocalcemia 05/01/2024   Hypokalemia  04/30/2024   Diarrhea 03/24/2024   Urinary incontinence 03/23/2024   Acute hypokalemia and hypomagnesemia secondary to diarrhea 03/22/2024   Hypomagnesemia 03/22/2024   Chronic diarrhea of unknown origin 03/22/2024   History of DVT (deep vein thrombosis) 03/22/2024   Anemia 03/22/2024   Sinus bradycardia 03/22/2024   Hypertensive urgency 04/08/2023   Malnutrition of moderate degree 04/08/2023   Unilateral amputation of left foot (HCC) 02/19/2022   Hypertension 02/19/2022   Nicotine  dependence 04/09/2021   Syncope and collapse 02/26/2021   Prolonged QT interval 02/26/2021   DM (diabetes mellitus), type 2 with peripheral vascular complications (HCC) 09/25/2015   Amputation of left foot (HCC) 12/02/2014   Benign essential HTN 12/02/2014   Depression, major, recurrent, mild (HCC) 12/02/2014   Hyperlipidemia, unspecified 12/02/2014   Vitamin B12 deficiency 12/02/2014   Peripheral neuropathic pain 12/02/2014   Atherosclerosis of native arteries of extremity with rest pain (HCC) 12/02/2014   Phantom limb (HCC) 12/02/2014   Diabetes mellitus with nephropathy (HCC) 12/02/2014   Detached retina 12/02/2014   Tobacco abuse 12/02/2014   Urge incontinence 12/02/2014   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   Vitamin D  deficiency 12/02/2014   PCP:  Glenard Mire, MD Pharmacy:   [facility] [phone] [address], [place] - [address] [place] [address] Phone: [phone] Fax: [phone]  [facility] [address] [place] [address] Phone: [phone] Fax: [phone]     Social Drivers of Health (SDOH) Social History: SDOH Screenings   Food Insecurity: No Food Insecurity (05/01/2024)  Housing: Low Risk  (05/01/2024)  Transportation Needs: Unmet Transportation Needs (05/01/2024)  Utilities: Not At Risk (05/01/2024)  Alcohol  Screen: Low Risk  (02/23/2023)  Depression (PHQ2-9): Low Risk  (02/02/2024)  Financial Resource  Strain: Medium Risk (04/08/2023)  Physical Activity: Insufficiently Active (03/26/2021)  Social Connections: Patient Declined (05/01/2024)  Recent Concern: Social Connections - Socially Isolated (03/23/2024)  Stress: No Stress Concern Present (03/26/2021)  Tobacco Use: High Risk (05/01/2024)   SDOH Interventions:     Readmission Risk Interventions     No data to display

## 2024-05-04 NOTE — TOC PASRR Note (Signed)
 To Whom it may Concern: Please Be advised that the above named patient will require a short term nursing home stay - anticipated 30 days or less for rehabilitation and strengthening.  The plan is for return home.

## 2024-05-04 NOTE — [place] FL2 (Signed)
 [place]  MEDICAID [id number] LEVEL OF CARE FORM     IDENTIFICATION  Patient Name: Lisa [name] Birthdate: 21-Oct-1945 Sex: female Admission Date (Current Location): 04/30/2024  [facility] [place] Number:  Chiropodist and Address:  [facility], [address], [place], [place] [address]      Provider Number: [id number]  Attending Physician Name and Address:  Jerelene Critchley, MD  Relative Name and Phone Number:  Vaughn [name] [phone]    Current Level of Care: Hospital Recommended Level of Care: Skilled Nursing Facility Prior Approval Number:    Date Approved/Denied:   PASRR Number: pending  Discharge Plan: SNF    Current Diagnoses: Patient Active Problem List   Diagnosis Date Noted   Acute on chronic diastolic CHF (congestive heart failure) (HCC) 05/03/2024   Fever 05/02/2024   Blurry vision, left eye 05/02/2024   Generalized weakness 05/02/2024   Shortness of breath 05/02/2024   Potassium-sensitive periodic paralysis 05/01/2024   Uncontrolled type 2 diabetes mellitus with hypoglycemia, without long-term current use of insulin  (HCC) 05/01/2024   Mediastinal mass 05/01/2024   Hypocalcemia 05/01/2024   Hypokalemia 04/30/2024   Diarrhea 03/24/2024   Urinary incontinence 03/23/2024   Acute hypokalemia and hypomagnesemia secondary to diarrhea 03/22/2024   Hypomagnesemia 03/22/2024   Chronic diarrhea of unknown origin 03/22/2024   History of DVT (deep vein thrombosis) 03/22/2024   Anemia 03/22/2024   Sinus bradycardia 03/22/2024   Hypertensive urgency 04/08/2023   Malnutrition of moderate degree 04/08/2023   Unilateral amputation of left foot (HCC) 02/19/2022   Hypertension 02/19/2022   Nicotine  dependence 04/09/2021   Syncope and collapse 02/26/2021   Prolonged QT interval 02/26/2021   DM (diabetes mellitus), type 2 with peripheral vascular complications (HCC) 09/25/2015   Amputation of left foot (HCC) 12/02/2014    Benign essential HTN 12/02/2014   Depression, major, recurrent, mild (HCC) 12/02/2014   Hyperlipidemia, unspecified 12/02/2014   Vitamin B12 deficiency 12/02/2014   Peripheral neuropathic pain 12/02/2014   Atherosclerosis of native arteries of extremity with rest pain (HCC) 12/02/2014   Phantom limb (HCC) 12/02/2014   Diabetes mellitus with nephropathy (HCC) 12/02/2014   Detached retina 12/02/2014   Tobacco abuse 12/02/2014   Urge incontinence 12/02/2014   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   Vitamin D  deficiency 12/02/2014    Orientation RESPIRATION BLADDER Height & Weight     Self, Time, Situation, Place  Normal Incontinent Weight: 57.5 kg Height:  5' 4 (162.6 cm)  BEHAVIORAL SYMPTOMS/MOOD NEUROLOGICAL BOWEL NUTRITION STATUS      Incontinent Diet (Regular)  AMBULATORY STATUS COMMUNICATION OF NEEDS Skin   Extensive Assist Verbally Normal                       Personal Care Assistance Level of Assistance  Bathing, Feeding, Dressing Bathing Assistance: Limited assistance Feeding assistance: Independent Dressing Assistance: Limited assistance     Functional Limitations Info  Sight, Hearing, Speech Sight Info: Impaired (glasses) Hearing Info: Adequate Speech Info: Adequate    SPECIAL CARE FACTORS FREQUENCY  PT (By licensed PT), OT (By licensed OT)     PT Frequency: 5 times per week OT Frequency: 5 times per week            Contractures Contractures Info: Not present    Additional Factors Info  Code Status, Allergies Code Status Info: Full Allergies Info: Other Not Specified  Itching Nucinta - Hallucinations  Tapentadol Hcl Not Specified  Itching Hallucinations  Actos  (pioglitazone ) Low  History of CHF  Penicillins Low  Rash           Current Medications (05/04/2024):  This is the current hospital active medication list Current Facility-Administered Medications  Medication Dose Route Frequency Provider Last Rate Last Admin   acetaminophen  (TYLENOL ) tablet  650 mg  650 mg Oral Q6H PRN Paudel, Keshab, MD   650 mg at 05/02/24 1633   Or   acetaminophen  (TYLENOL ) suppository 650 mg  650 mg Rectal Q6H PRN Roann Gouty, MD       artificial tears ophthalmic solution 2 drop  2 drop Right Eye PRN Josette Ade, MD       aspirin  EC tablet 81 mg  81 mg Oral Daily Paudel, Keshab, MD   81 mg at 05/04/24 0920   atropine  1 % ophthalmic solution 1 drop  1 drop Right Eye TID Lenon Elsie HERO, RPH   1 drop at 05/04/24 9081   brimonidine  (ALPHAGAN ) 0.2 % ophthalmic solution 1 drop  1 drop Left Eye BID Josette Ade, MD   1 drop at 05/04/24 0915   calcium  carbonate (OS-CAL - dosed in mg of elemental calcium ) tablet 1,250 mg  1 tablet Oral BID WC Josette Ade, MD   1,250 mg at 05/04/24 0920   cefTRIAXone  (ROCEPHIN ) 2 g in sodium chloride  0.9 % 100 mL IVPB  2 g Intravenous Q24H Josette Ade, MD 200 mL/hr at 05/04/24 1252 2 g at 05/04/24 1252   citalopram  (CELEXA ) tablet 10 mg  10 mg Oral Daily Josette Ade, MD   10 mg at 05/04/24 9078   clopidogrel  (PLAVIX ) tablet 75 mg  75 mg Oral Daily Paudel, Keshab, MD   75 mg at 05/04/24 9078   cyanocobalamin  (VITAMIN B12) tablet 1,000 mcg  1,000 mcg Oral Daily Josette Ade, MD   1,000 mcg at 05/04/24 9078   diphenoxylate -atropine  (LOMOTIL ) 2.5-0.025 MG per tablet 2 tablet  2 tablet Oral QID PRN Ponnala, Shruthi, MD       dorzolamide -timolol  (COSOPT ) 2-0.5 % ophthalmic solution 1 drop  1 drop Left Eye BID Josette Ade, MD   1 drop at 05/04/24 0915   doxycycline  (VIBRA -TABS) tablet 100 mg  100 mg Oral Q12H Wieting, Richard, MD   100 mg at 05/04/24 0920   enoxaparin  (LOVENOX ) injection 40 mg  40 mg Subcutaneous Q24H Paudel, Gouty, MD   40 mg at 05/03/24 2249   gabapentin  (NEURONTIN ) capsule 100 mg  100 mg Oral TID Paudel, Keshab, MD   100 mg at 05/03/24 2249   hydrALAZINE  (APRESOLINE ) injection 10 mg  10 mg Intravenous Q6H PRN Paudel, Keshab, MD   10 mg at 05/04/24 0919   lipase/protease/amylase (CREON )  capsule 72,000 Units  72,000 Units Oral TID THEOPOLIS Josette Ade, MD   72,000 Units at 05/04/24 1250   losartan  (COZAAR ) tablet 100 mg  100 mg Oral Daily Paudel, Keshab, MD   100 mg at 05/04/24 9078   metoprolol  succinate (TOPROL -XL) 24 hr tablet 12.5 mg  12.5 mg Oral Daily Josette Ade, MD   12.5 mg at 05/04/24 9078   multivitamin with minerals tablet 1 tablet  1 tablet Oral Daily Josette Ade, MD   1 tablet at 05/04/24 0920   oxyCODONE  (Oxy IR/ROXICODONE ) immediate release tablet 5 mg  5 mg Oral Q4H PRN Paudel, Keshab, MD       potassium chloride  SA (KLOR-CON  M) CR tablet 40 mEq  40 mEq Oral TID Josette Ade, MD   40 mEq at 05/04/24 9078   prednisoLONE  acetate (  PRED FORTE ) 1 % ophthalmic suspension 1 drop  1 drop Right Eye QID Josette Ade, MD   1 drop at 05/04/24 1433   rosuvastatin  (CRESTOR ) tablet 10 mg  10 mg Oral Daily Paudel, Keshab, MD   10 mg at 05/04/24 0920   spironolactone  (ALDACTONE ) tablet 25 mg  25 mg Oral Daily Josette Ade, MD   25 mg at 05/04/24 9078     Discharge Medications: Please see discharge summary for a list of discharge medications.  Relevant Imaging Results:  Relevant Lab Results:   Additional Information SS [id number]  Nathanael CHRISTELLA Ring, RN

## 2024-05-04 NOTE — Consult Note (Signed)
 [facility] CONSULT NOTE  Patient Care Team: Sowles, Krichna, MD as PCP - General (Family Medicine) Sowles, Krichna, MD as Attending Physician (Family Medicine) Penne Knee, MD (Inactive) as Consulting Physician (Urology) Marea Selinda RAMAN, MD as Referring Physician (Vascular Surgery) Therisa Bi, MD as Consulting Physician (Gastroenterology) Unk Corinn Skiff, MD as Consulting Physician (Gastroenterology)  CHIEF COMPLAINTS/PURPOSE OF CONSULTATION: chronic diarrhea/elevated chromogranin A Levels  Oncology History   No history exists.    HISTORY OF PRESENTING ILLNESS: Patient in bed.   Alone.   Lisa [name] 78 y.o.  female pleasant patient with history of chronic diarrhea-chronic endocrine imbalance and history of hypertension hyperlipidemia CHF is currently admitted to hospital for worsening diarrhea and also generalized weakness.   Patient has had recent multiple hospitalizations-for abdominal pain diarrhea.  Patient patient underwent EGD colonoscopy-without any active pathology.  Patient was started on Creon  for possible pancreatic insufficiency.  At the time of this admission patient noted to have severe electrolyte abnormalities including hypokalemia hypocalcemia needing IV fluids and electrolyte supplementation.  Patient states that she is continue to have diarrhea up to 6 loose stools a day.  GI has been consulted-and further workup showed elevated chromogranin a level.  Oncology has been consulted for further recommendations/further workup.  Patient denies any wheezing.  Denies any flushing.  Denies any leg swelling.  Patient states her appetite is good.  Denies any prior history of any malignancies.  Patient lives alone.  Review of Systems  Constitutional:  Positive for malaise/fatigue and weight loss. Negative for chills, diaphoresis and fever.  HENT:  Negative for nosebleeds and sore throat.   Eyes:  Negative for double vision.  Respiratory:   Negative for cough, hemoptysis, sputum production, shortness of breath and wheezing.   Cardiovascular:  Negative for chest pain, palpitations, orthopnea and leg swelling.  Gastrointestinal:  Positive for diarrhea. Negative for abdominal pain, blood in stool, constipation, heartburn, melena, nausea and vomiting.  Genitourinary:  Negative for dysuria, frequency and urgency.  Musculoskeletal:  Negative for back pain and joint pain.  Skin: Negative.  Negative for itching and rash.  Neurological:  Negative for dizziness, tingling, focal weakness, weakness and headaches.  Endo/Heme/Allergies:  Does not bruise/bleed easily.  Psychiatric/Behavioral:  Negative for depression. The patient is not nervous/anxious and does not have insomnia.     MEDICAL HISTORY:  Past Medical History:  Diagnosis Date   Blindness of right eye    CHF (congestive heart failure) (HCC)    Depression    Diabetes mellitus without complication (HCC)    DVT of leg (deep venous thrombosis) (HCC) 2011   Glaucoma    Grade II diastolic dysfunction    Hyperlipidemia    Hypertension    Vascular disease     SURGICAL HISTORY: Past Surgical History:  Procedure Laterality Date   ABDOMINAL HYSTERECTOMY  1992   Total   CATARACT EXTRACTION W/PHACO Left 02/25/2023   Procedure: CATARACT EXTRACTION PHACO AND INTRAOCULAR LENS PLACEMENT (IOC) LEFT DIABETIC  malyugin;  Surgeon: Mittie Gaskin, MD;  Location: [facility];  Service: Ophthalmology;  Laterality: Left;  10.78 0:49.1   COLONOSCOPY N/A 03/24/2024   Procedure: COLONOSCOPY;  Surgeon: Jinny Carmine, MD;  Location: [facility];  Service: Endoscopy;  Laterality: N/A;   ESOPHAGOGASTRODUODENOSCOPY N/A 03/24/2024   Procedure: EGD (ESOPHAGOGASTRODUODENOSCOPY);  Surgeon: Jinny Carmine, MD;  Location: [facility] ENDOSCOPY;  Service: Endoscopy;  Laterality: N/A;   EYE SURGERY     LOWER EXTREMITY ANGIOGRAPHY Left 09/03/2023   Procedure: Lower Extremity Angiography;  Surgeon: Marea Selinda RAMAN, MD;  Location: [facility] INV[facility];  Service: Cardiovascular;  Laterality: Left;   LOWER EXTREMITY ANGIOGRAPHY Left 11/05/2023   Procedure: Lower Extremity Angiography;  Surgeon: Marea Selinda RAMAN, MD;  Location: [facility];  Service: Cardiovascular;  Laterality: Left;   LOWER EXTREMITY INTERVENTION Left 09/03/2023   Procedure: LOWER EXTREMITY INTERVENTION;  Surgeon: Marea Selinda RAMAN, MD;  Location: [facility];  Service: Cardiovascular;  Laterality: Left;   LOWER EXTREMITY INTERVENTION Left 11/05/2023   Procedure: LOWER EXTREMITY INTERVENTION;  Surgeon: Marea Selinda RAMAN, MD;  Location: [facility] CV [facility];  Service: Cardiovascular;  Laterality: Left;   TOE AMPUTATION Left 2011   All of her toes on the left foot   VASCULAR SURGERY      SOCIAL HISTORY: Social History   Socioeconomic History   Marital status: Widowed    Spouse name: Not on file   Number of children: 0   Years of education: Not on file   Highest education level: Some college, no degree  Occupational History   Occupation: Retired  Tobacco Use   Smoking status: Some Days    Current packs/day: 0.25    Average packs/day: 0.3 packs/day for 41.2 years (10.3 ttl pk-yrs)    Types: Cigarettes    Start date: 03/16/2000   Smokeless tobacco: Never  Vaping Use   Vaping status: Never Used  Substance and Sexual Activity   Alcohol  use: Yes    Comment: Wine Ocassionally   Drug use: No   Sexual activity: Not Currently  Other Topics Concern   Not on file  Social History Narrative   Not on file   Social Drivers of Health   Financial Resource Strain: Medium Risk (04/08/2023)   Overall Financial Resource Strain (CARDIA)    Difficulty of Paying Living Expenses: Somewhat hard  Food Insecurity: No Food Insecurity (05/01/2024)   Hunger Vital Sign    Worried About Running Out of Food in the Last Year: Never true    Ran Out of Food in the Last Year: Never true  Transportation Needs: Unmet Transportation Needs  (05/01/2024)   PRAPARE - Transportation    Lack of Transportation (Medical): Yes    Lack of Transportation (Non-Medical): Yes  Physical Activity: Insufficiently Active (03/26/2021)   Exercise Vital Sign    Days of Exercise per Week: 3 days    Minutes of Exercise per Session: 20 min  Stress: No Stress Concern Present (03/26/2021)   [employer] of Occupational Health - Occupational Stress Questionnaire    Feeling of Stress : Only a little  Social Connections: Patient Declined (05/01/2024)   Social Connection and Isolation Panel    Frequency of Communication with Friends and Family: Patient declined    Frequency of Social Gatherings with Friends and Family: Patient declined    Attends Religious Services: Patient declined    Database Administrator or Organizations: Patient declined    Attends Banker Meetings: Patient declined    Marital Status: Patient declined  Recent Concern: Social Connections - Socially Isolated (03/23/2024)   Social Connection and Isolation Panel    Frequency of Communication with Friends and Family: More than three times a week    Frequency of Social Gatherings with Friends and Family: Never    Attends Religious Services: Never    Database Administrator or Organizations: No    Attends Banker Meetings: Never    Marital Status: Widowed  Intimate Partner Violence: Not At  Risk (05/01/2024)   Humiliation, Afraid, Rape, and Kick questionnaire    Fear of Current or Ex-Partner: No    Emotionally Abused: No    Physically Abused: No    Sexually Abused: No    FAMILY HISTORY: Family History  Problem Relation Age of Onset   Diabetes Mother    Heart disease Father    Diabetes Sister    Diabetes Sister    Diabetes Brother    Kidney disease Brother        Transplant   Heart disease Brother     ALLERGIES:  is allergic to other, tapentadol hcl, actos  [pioglitazone ], and penicillins.  MEDICATIONS:  Current Facility-Administered  Medications  Medication Dose Route Frequency Provider Last Rate Last Admin   acetaminophen  (TYLENOL ) tablet 650 mg  650 mg Oral Q6H PRN Paudel, Keshab, MD   650 mg at 05/02/24 1633   Or   acetaminophen  (TYLENOL ) suppository 650 mg  650 mg Rectal Q6H PRN Roann Gouty, MD       artificial tears ophthalmic solution 2 drop  2 drop Right Eye PRN Josette Ade, MD       aspirin  EC tablet 81 mg  81 mg Oral Daily Paudel, Keshab, MD   81 mg at 05/04/24 0920   atropine  1 % ophthalmic solution 1 drop  1 drop Right Eye TID Lenon Elsie HERO, RPH   1 drop at 05/04/24 1642   brimonidine  (ALPHAGAN ) 0.2 % ophthalmic solution 1 drop  1 drop Left Eye BID Josette Ade, MD   1 drop at 05/04/24 0915   calcium  carbonate (OS-CAL - dosed in mg of elemental calcium ) tablet 1,250 mg  1 tablet Oral BID WC Josette Ade, MD   1,250 mg at 05/04/24 1756   cefTRIAXone  (ROCEPHIN ) 2 g in sodium chloride  0.9 % 100 mL IVPB  2 g Intravenous Q24H Josette Ade, MD 200 mL/hr at 05/04/24 1252 2 g at 05/04/24 1252   citalopram  (CELEXA ) tablet 10 mg  10 mg Oral Daily Josette Ade, MD   10 mg at 05/04/24 9078   clopidogrel  (PLAVIX ) tablet 75 mg  75 mg Oral Daily Paudel, Keshab, MD   75 mg at 05/04/24 9078   cyanocobalamin  (VITAMIN B12) tablet 1,000 mcg  1,000 mcg Oral Daily Josette Ade, MD   1,000 mcg at 05/04/24 9078   diphenoxylate -atropine  (LOMOTIL ) 2.5-0.025 MG per tablet 2 tablet  2 tablet Oral QID PRN Ponnala, Shruthi, MD       dorzolamide -timolol  (COSOPT ) 2-0.5 % ophthalmic solution 1 drop  1 drop Left Eye BID Wieting, Richard, MD   1 drop at 05/04/24 0915   doxycycline  (VIBRA -TABS) tablet 100 mg  100 mg Oral Q12H Wieting, Richard, MD   100 mg at 05/04/24 0920   enoxaparin  (LOVENOX ) injection 40 mg  40 mg Subcutaneous Q24H Paudel, Keshab, MD   40 mg at 05/03/24 2249   gabapentin  (NEURONTIN ) capsule 100 mg  100 mg Oral TID Paudel, Keshab, MD   100 mg at 05/03/24 2249   hydrALAZINE  (APRESOLINE ) injection 10  mg  10 mg Intravenous Q6H PRN Paudel, Keshab, MD   10 mg at 05/04/24 0919   lipase/protease/amylase (CREON ) capsule 72,000 Units  72,000 Units Oral TID THEOPOLIS Josette Ade, MD   72,000 Units at 05/04/24 1749   losartan  (COZAAR ) tablet 100 mg  100 mg Oral Daily Paudel, Gouty, MD   100 mg at 05/04/24 9078   metoprolol  succinate (TOPROL -XL) 24 hr tablet 12.5 mg  12.5 mg Oral Daily Josette Ade, MD  12.5 mg at 05/04/24 9078   multivitamin with minerals tablet 1 tablet  1 tablet Oral Daily Josette Ade, MD   1 tablet at 05/04/24 0920   oxyCODONE  (Oxy IR/ROXICODONE ) immediate release tablet 5 mg  5 mg Oral Q4H PRN Paudel, Keshab, MD       potassium chloride  SA (KLOR-CON  M) CR tablet 40 mEq  40 mEq Oral TID Josette Ade, MD   40 mEq at 05/04/24 1641   prednisoLONE  acetate (PRED FORTE ) 1 % ophthalmic suspension 1 drop  1 drop Right Eye QID Josette Ade, MD   1 drop at 05/04/24 1747   rosuvastatin  (CRESTOR ) tablet 10 mg  10 mg Oral Daily Paudel, Keshab, MD   10 mg at 05/04/24 0920   spironolactone  (ALDACTONE ) tablet 25 mg  25 mg Oral Daily Josette Ade, MD   25 mg at 05/04/24 0921    PHYSICAL EXAMINATION:   Vitals:   05/04/24 1219 05/04/24 1443  BP: (!) 124/52 (!) 119/57  Pulse: 64 66  Resp: 18 17  Temp: 98.1 F (36.7 C) 97.8 F (36.6 C)  SpO2: 98% 98%   Filed Weights   04/30/24 1454  Weight: 126 lb 12.2 oz (57.5 kg)    Physical Exam Vitals and nursing note reviewed.  HENT:     Head: Normocephalic and atraumatic.     Mouth/Throat:     Pharynx: Oropharynx is clear.  Eyes:     Extraocular Movements: Extraocular movements intact.     Pupils: Pupils are equal, round, and reactive to light.  Cardiovascular:     Rate and Rhythm: Normal rate and regular rhythm.  Pulmonary:     Comments: Decreased breath sounds bilaterally.  Abdominal:     Palpations: Abdomen is soft.  Musculoskeletal:        General: Normal range of motion.     Cervical back: Normal range of  motion.  Skin:    General: Skin is warm.  Neurological:     General: No focal deficit present.     Mental Status: She is alert and oriented to person, place, and time.  Psychiatric:        Behavior: Behavior normal.        Judgment: Judgment normal.     LABORATORY DATA:  I have reviewed the data as listed Lab Results  Component Value Date   WBC 10.8 (H) 05/03/2024   HGB 11.5 (L) 05/03/2024   HCT 34.3 (L) 05/03/2024   MCV 83.7 05/03/2024   PLT 258 05/03/2024   Recent Labs    03/22/24 1838 03/23/24 0157 03/27/24 0526 03/28/24 0355 04/30/24 1503 04/30/24 1605 05/01/24 0612 05/01/24 1614 05/02/24 0320 05/02/24 1829 05/03/24 0339 05/04/24 0445  NA 142   < > 142  --  143   < > 142   < > 141  --  143 139  K <2.0*   < > 3.3*   < > <2.0*   < > 2.1*   < > 2.9* 2.6* 2.9* 3.8  CL 97*   < > 107  --  97*   < > 100   < > 101  --  100 102  CO2 30   < > 27  --  33*   < > 30   < > 28  --  31 30  GLUCOSE 107*   < > 99  --  325*   < > 137*   < > 147*  --  141* 166*  BUN 15   < >  11  --  17   < > 13   < > 11  --  9 15  CREATININE 0.62   < > 0.72  --  0.84   < > 0.73   < > 0.67  --  0.55 0.77  CALCIUM  7.7*   < > 8.2*  --  6.2*   < > 6.6*   < > 6.2*  --  7.3* 7.7*  GFRNONAA >60   < > >60  --  >60   < > >60   < > >60  --  >60 >60  PROT 6.5  --   --   --  6.2*  --  5.3*  --   --   --   --   --   ALBUMIN 2.9*  --  2.4*  --  3.4*  --  2.9*  --   --   --   --   --   AST 23  --   --   --  43*  --  38  --   --   --   --   --   ALT 19  --   --   --  20  --  17  --   --   --   --   --   ALKPHOS 78  --   --   --  91  --  73  --   --   --   --   --   BILITOT 0.9  --   --   --  0.7  --  0.6  --   --   --   --   --    < > = values in this interval not displayed.    RADIOGRAPHIC STUDIES: I have personally reviewed the radiological images as listed and agreed with the findings in the report. CT CHEST ABDOMEN PELVIS W CONTRAST Result Date: 05/04/2024 EXAM: CT CHEST, ABDOMEN AND PELVIS WITH  CONTRAST 05/04/2024 05:29:03 PM TECHNIQUE: CT of the chest, abdomen and pelvis was performed with the administration of 100 mL of iohexol  (OMNIPAQUE ) 300 MG/ML solution intravenous contrast. Multiplanar reformatted images are provided for review. Automated exposure control, iterative reconstruction, and/or weight based adjustment of the mA/kV was utilized to reduce the radiation dose to as low as reasonably achievable. COMPARISON: CTA chest dated 04/08/2023. CLINICAL HISTORY: Mediastinal mass; and diarrhea - rule out carcinoid syndrome - abnormal weight loss. FINDINGS: CHEST: MEDIASTINUM AND LYMPH NODES: Mild cardiomegaly. Moderate coronary atherosclerosis of the LAD and left circumflex. Thoracic aortic atherosclerosis. Stable 3.0 cm mass with benign anterior scalloping of the T4-T5 vertebral bodies, favoring a benign foregut duplication cyst. The central airways are clear. No mediastinal, hilar or axillary lymphadenopathy. LUNGS AND PLEURA: Small bilateral pleural effusions, chronic, with associated lower lobe atelectasis. Faint patchy ground-glass opacity in the right upper lobe (images 25 and 41), favoring post-infectious/inflammatory scarring when correlating with the prior. No pneumothorax. ABDOMEN AND PELVIS: LIVER: The liver is unremarkable. GALLBLADDER AND BILE DUCTS: Gallbladder is unremarkable. No biliary ductal dilatation. SPLEEN: No acute abnormality. PANCREAS: No acute abnormality. ADRENAL GLANDS: No acute abnormality. KIDNEYS, URETERS AND BLADDER: No stones in the kidneys or ureters. No hydronephrosis. No perinephric or periureteral stranding. Urinary bladder is unremarkable. GI AND BOWEL: Stomach demonstrates no acute abnormality. There is no bowel obstruction. REPRODUCTIVE ORGANS: Post hysterectomy. PERITONEUM AND RETROPERITONEUM: No ascites. No free air. VASCULATURE: Aorta is normal in caliber. ABDOMINAL AND PELVIS LYMPH NODES: No lymphadenopathy. BONES  AND SOFT TISSUES: Mild degenerative changes of  the visualized thoracolumbar spine. The T4-T5 vertebral body scalloping associated with the mediastinal mass is benign and stable. No acute osseous abnormality. No focal soft tissue abnormality. IMPRESSION: 1. 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 2. No findings suspicious for malignancy/metastatic disease. 3. Suspected postinfectious/inflammatory scarring in the right upper lobe. 4. Small bilateral pleural effusions, chronic. Electronically signed by: Pinkie Pebbles MD 05/04/2024 07:59 PM EST RP Workstation: [id number]   DG Chest Port 1 View Result Date: 05/02/2024 CLINICAL DATA:  Fever EXAM: PORTABLE CHEST 1 VIEW COMPARISON:  Chest radiograph dated 03/23/2024 FINDINGS: Normal lung volumes. Diffuse interstitial opacities with focal hazy/patchy opacities in the right upper lung. No pleural effusion or pneumothorax. Similar enlarged cardiomediastinal silhouette. No acute osseous abnormality. IMPRESSION: Diffuse interstitial opacities with focal hazy/patchy opacities in the right upper lung, which may represent pneumonia on a background of pulmonary edema. Electronically Signed   By: Limin  Xu M.D.   On: 05/02/2024 17:28   DG Abd 1 View Result Date: 05/01/2024 EXAM: 1 VIEW XRAY OF THE ABDOMEN 05/01/2024 09:08:25 AM COMPARISON: None available. CLINICAL HISTORY: [id number] Encopresis [id number] Encopresis. FINDINGS: BOWEL: Mild diffuse gaseous distention of the bowel. Fecal material within the right colon. SOFT TISSUES: Moderate vascular calcifications. BONES: No acute fracture. HEART: The heart is enlarged. IMPRESSION: 1. Mild diffuse gaseous distention of the bowel and fecal material within the right colon, consistent with encopresis. 2. Enlarged heart. 3. Moderate vascular calcifications. Electronically signed by: Evalene Coho MD 05/01/2024 09:13 AM EST RP Workstation: [id number]   # 78 year old female patient with multiple medical comorbidities currently  admitted hospital for acute on chronic diarrhea and severe appetite abnormalities.  # Acute on chronic diarrhea-with mildly elevated chromogranin a level.  The etiology of diarrhea is unclear-suspect secretory as per GI.  Status post EGD/colonoscopy-rectal pathology noted chromogranin a level is mildly elevated-which is quite nonspecific.  No evidence of any flushing or any wheezing.  # Severe electrolyte abnormalities-hypokalemia/hypomagnesemia-needing IV supplementation.  # Recommendations/plan:  # Given the significant weight loss and also unclear nature of the diarrhea-I think is reasonable to evaluate for malignancy-like carcinoid/paraneoplastic syndrome.  Will order CT scan chest ab pelvis; also order 24-hour 5-HIAA urine collection.  # Continue symptomatic treatment of diarrhea.  # The above plan of care was discussed with the patient in detail.  Also discussed with primary team and GI service.  Thank you for allowing me to participate in the care of your pleasant patient. Please do not hesitate to contact me with questions or concerns in the interim.  Above plan of care was discussed with patient in detail.  My contact information was given to the patient.    Cindy JONELLE Joe, MD 05/04/2024 8:06 PM

## 2024-05-04 NOTE — Progress Notes (Signed)
 I have been keeping in touch with the patient over the last 2 days explained to her that we are waiting for the labs to result in the workup of her diarrhea.  She appears very frustrated about the diarrhea and the lack of findings so far.  I explained to her the stepwise process of first doing stool test which were negative and then proceeding with evaluation of the colon and and small bowel with an EGD and colonoscopy which she has gone through with biopsies being normal.  I then explained to her that the blood work was the next step to find possible uncommon causes for her diarrhea.  I explained to her that I did not have much more to offer her except increasing her antidiarrheal medication which she is adamantly opposed to stating that she takes too much medication already.  We also discussed that her longstanding diabetes could also be a cause for the diarrhea.  On Monday I discussed with the hospitalist, and documented in his note, about scheduled Imodium  which was instituted.  Yesterday I met with the hospitalist about further possibilities of changing her antidiarrheal medication and the possibility of adding clonidine if everything came back negative due to her longstanding diabetes.  At 17:35 yesterday the chromogranin A came back at 693.7 and I was made aware of these results this morning.  This can be elevated in neuroendocrine tumors.  I have asked for an oncology consult and they will be seeing the patient today.

## 2024-05-04 NOTE — Progress Notes (Signed)
 PHARMACY CONSULT NOTE - FOLLOW UP  Pharmacy Consult for Electrolyte Monitoring and Replacement   Recent Labs: Potassium (mmol/L)  Date Value  05/04/2024 3.8   Magnesium  (mg/dL)  Date Value  87/89/7974 1.6 (L)   Calcium  (mg/dL)  Date Value  87/89/7974 7.7 (L)   Albumin (g/dL)  Date Value  87/92/7974 2.9 (L)  09/25/2015 4.4   Phosphorus (mg/dL)  Date Value  87/90/7974 2.6   Sodium (mmol/L)  Date Value  05/04/2024 139  09/25/2015 142   Assessment: 78 yo F with PMH including chronic diarrhea and electrolyte imbalances presenting with abdominal pain.  Medications: Calcium  carbonate 1250mg  BID, losartan , KCL 40 meq po TID  Goal of Therapy:  Electrolytes WNL   Plan: Mag sulfate 2g IV x 1 dose  Will check next K w/ next AM labs Check electrolytes with AM labs  Estill CHRISTELLA Lutes, PharmD, BCPS Clinical Pharmacist 05/04/2024 8:10 AM

## 2024-05-04 NOTE — Progress Notes (Signed)
 Progress Note   Patient: Lisa [name] FMW:[id number] DOB: Oct 03, 1945 DOA: 04/30/2024     4 DOS: the patient was seen and examined on 05/04/2024   Brief hospital course: 78 y.o. female with medical history significant for chronic functional diarrhea, chronic electrolyte imbalance, prior history of DVT not on anticoagulation, HTN, HLD, CHF, DM, depression who was recently at [facility] between 03/14/2024 and 03/29/2024 for abdominal pain.  At that time she underwent EGD which showed a small hiatal hernia, normal stomach, normal examined duodenum.  S/p colonoscopy showed a large amount of stool in the entire colon precluding visualization, biopsy taken, colon preparation was poor.  Pathology showed duodenal mucosa with villous architecture.  Colonic mucosa was unremarkable and was negative for microscopic colitis, active inflammation and chronic changes.  If biopsies are known diagnostic, patient will need another colonoscopy outpatient with better preparation as per GI.  Patient did not follow through with colonoscopy nor fecal calprotectin study 06/2022.  Patient was started on scheduled Imodium  per GI recommendation that time.   Today patient came into the hospital complaining of lysed weakness at home to the point that she had to use walker.  She says she has been having usual diarrhea no problem.  She has no problem eating.  She denies any other symptoms.   ED Course: Upon arrival to the ED, patient is found to be severely low potassium at less than 2.0, low calcium  at 6.4, high anion gap at 17, bicarb was 32.  White count was normal.  Patient denied any other symptoms.  Patient was replaced with potassium and calcium  and hospital service was consulted for evaluation for admission for generalized weakness and electrolyte abnormalities.   12/7.  Patient states that she has had diarrhea since August.  Coming in with electrolyte abnormalities and diarrhea.  Case discussed with gastroenterology and may  have an overflow constipation and pancreatic insufficiency will try creon . 12/8.  Patient complains of blurry vision today.  She states she is blind in her right eye and has blurry vision in her left eye.  Patient able to move her legs better today.  Still having some diarrhea. 12/9.  Patient feeling a little bit better.  Blurry vision little less.  Will follow-up with eye doctor upon discharge in a week.  Still having diarrhea.  Still with low potassium 2.9 12/10: Reports feeling the same, no improvement in diarrhea.  After prolonged discussion at the bedside, agreed to try Lomotil  instead of Imodium . Oncology consulted due to elevated chromogranin A. Discussed with GI and pharmacy, Veberzi not available in house  Assessment and Plan: Hypokalemia Severe hypokalemia on presentation.  With aggressive supplementation potassium improved to 3.8 Oral potassium 40 mEq 3 times, on Aldactone    Potassium-sensitive periodic paralysis Patient able to move her legs better today than yesterday.  PT evaluation recommending rehab   Hypomagnesemia Replaced aggressively on presentation, IV Mag 2gm today   Acute on chronic diastolic CHF (congestive heart failure) (HCC) Patient needed a dose of Lasix  on the evening of 12/8 with aggressive IV electrolyte supplementation.  Patient on Toprol  and losartan .  Add Aldactone .   Chronic diarrhea of unknown origin Stool studies negative, stool osm pending, likely secretory S/p EGD, colonoscopy -biopsy normal - Chromogranin A elevated 693.7, ?  Neuroendocrine tumors - Oncology consulted - CT C/A/P with contrast pending - Agreed to try Lomotil , discontinued Imodium    Fever Possible pneumonia.  On Rocephin  and Zithromax.  Staph species in blood culture likely contaminant. Repeat Bcx pending  Sinus bradycardia On Toprol -XL   Hyperlipidemia, unspecified On Crestor    Generalized weakness Physical therapy recommending rehab   Blurry vision, left eye Appreciate  ophthalmology evaluation.  Will need close follow-up as outpatient.  Continue eyedrops.   Hypocalcemia Replaced IV previously continue oral supplementation.   Mediastinal mass Seen on imaging back in 2024 with benign appearance.  Follow-up as outpatient   Uncontrolled type 2 diabetes mellitus with hypoglycemia, without long-term current use of insulin  (HCC) Discontinue sliding scale since last hemoglobin A1c 6.4.  Initially on D10 drip.   Tobacco abuse Stop smoking   Benign essential HTN On Norvasc  and metoprolol , losartan , Aldactone   Subjective: Patient feeling little bit better today.  Continues to have ongoing diarrhea with no improvement with Imodium  Discussed with GI at the bedside, appreciate recs.  Started Lomotil  to see if it helps Pending oncology evaluation  Physical Exam: Vitals:   05/03/24 1958 05/04/24 0033 05/04/24 0323 05/04/24 0815  BP: (!) 165/72 (!) 156/63 (!) 173/64 (!) 184/85  Pulse: 88 65 64 65  Resp: 16 20 20 14   Temp: 99.1 F (37.3 C) 98.7 F (37.1 C) 98.8 F (37.1 C) 98.6 F (37 C)  TempSrc: Oral Oral Oral Oral  SpO2: (!) 87% 100% 100% 100%  Weight:      Height:       HENT:     Head: Normocephalic.  Eyes:     General: Lids are normal.     Conjunctiva/sclera: Conjunctivae normal.  Cardiovascular:     Rate and Rhythm: Normal rate and regular rhythm.     Heart sounds: S1 normal and S2 normal. Murmur heard.     Systolic murmur is present with a grade of 2/6.  Pulmonary:     Breath sounds: Examination of the right-lower field reveals decreased breath sounds. Examination of the left-lower field reveals decreased breath sounds. Decreased breath sounds present. No wheezing, rhonchi or rales.  Abdominal:     Palpations: Abdomen is soft.     Tenderness: There is no abdominal tenderness.  Musculoskeletal:     Right lower leg: No swelling.     Left lower leg: No swelling.  Skin:    General: Skin is warm.     Findings: No rash.  Neurological:      Mental Status: She is alert and oriented to person, place, and time.     Comments: Patient able to lift her legs up off the bed.    Data Reviewed: Labs and Imaging reviewed  Family Communication: updated brother on phone  Disposition: Status is: Inpatient Remains inpatient appropriate because: Patient still with low potassium.  Ongoing diarrhea, further workup pending  Planned Discharge Destination: Physical therapy recommending rehab    Time spent: 52 minutes  Author: Laree Lock, MD 05/04/2024 8:24 AM  For on call review [email or web address].

## 2024-05-05 ENCOUNTER — Encounter: Payer: Self-pay | Admitting: Gastroenterology

## 2024-05-05 LAB — BASIC METABOLIC PANEL WITH GFR
Anion gap: 7 (ref 5–15)
BUN: 12 mg/dL (ref 8–23)
CO2: 24 mmol/L (ref 22–32)
Calcium: 8.4 mg/dL — ABNORMAL LOW (ref 8.9–10.3)
Chloride: 104 mmol/L (ref 98–111)
Creatinine, Ser: 0.73 mg/dL (ref 0.44–1.00)
GFR, Estimated: >60 mL/min (ref >60)
Glucose, Bld: 148 mg/dL — ABNORMAL HIGH (ref 70–99)
Potassium: 5.9 mmol/L — ABNORMAL HIGH (ref 3.5–5.1)
Sodium: 136 mmol/L (ref 135–145)

## 2024-05-05 LAB — CULTURE, BLOOD (ROUTINE X 2): Special Requests: ADEQUATE

## 2024-05-05 LAB — MAGNESIUM: Magnesium: 1.9 mg/dL (ref 1.7–2.4)

## 2024-05-05 LAB — GLUCOSE, CAPILLARY
Glucose-Capillary: 131 mg/dL — ABNORMAL HIGH (ref 70–99)
Glucose-Capillary: 200 mg/dL — ABNORMAL HIGH (ref 70–99)
Glucose-Capillary: 166 mg/dL — ABNORMAL HIGH (ref 70–99)
Glucose-Capillary: 179 mg/dL — ABNORMAL HIGH (ref 70–99)

## 2024-05-05 LAB — POTASSIUM: Potassium: 5.1 mmol/L (ref 3.5–5.1)

## 2024-05-05 MED ORDER — CHLORHEXIDINE GLUCONATE CLOTH 2 % EX PADS
6.0000 | MEDICATED_PAD | Freq: Every day | CUTANEOUS | Status: DC
  Administered 2024-05-05: 6 via TOPICAL

## 2024-05-05 MED ORDER — SODIUM ZIRCONIUM CYCLOSILICATE 10 G PO PACK
10.0000 g | PACK | ORAL | Status: AC
  Administered 2024-05-05: 10 g via ORAL
  Filled 2024-05-05: qty 1

## 2024-05-05 NOTE — TOC Progression Note (Signed)
 Transition of Care [facility]) - Progression Note    Patient Details  Name: Lisa [name] MRN: [id number] Date of Birth: March 17, 1946  Transition of Care [facility]) CM/SW Contact  Nathanael CHRISTELLA Ring, RN Phone Number: 05/05/2024, 3:18 PM  Clinical Narrative:    [facility] declined, they say they will not have any beds until next week.  Notified patient and she will not agree to go to any other facility, she says she will go home.  She is going to ask her brother to stay with her.  She is agreeable to Home Health services.  CM will send out referrals to [facility] agencies.     Expected Discharge Plan: Home w Home Health Services Barriers to Discharge: Continued Medical Work up               Expected Discharge Plan and Services   Discharge Planning Services: CM Consult Post Acute Care Choice: Home Health Living arrangements for the past 2 months: Single Family Home                 DME Arranged: N/A         HH Arranged: RN, PT, OT, Nurse's Aide           Social Drivers of Health (SDOH) Interventions SDOH Screenings   Food Insecurity: No Food Insecurity (05/01/2024)  Housing: Low Risk (05/01/2024)  Transportation Needs: Unmet Transportation Needs (05/01/2024)  Utilities: Not At Risk (05/01/2024)  Alcohol  Screen: Low Risk (02/23/2023)  Depression (PHQ2-9): Low Risk (02/02/2024)  Financial Resource Strain: Medium Risk (04/08/2023)  Social Connections: Patient Declined (05/01/2024)  Recent Concern: Social Connections - Socially Isolated (03/23/2024)  Tobacco Use: High Risk (05/01/2024)    Readmission Risk Interventions     No data to display

## 2024-05-05 NOTE — Plan of Care (Signed)

## 2024-05-05 NOTE — Progress Notes (Addendum)
 Progress Note   Patient: Lisa [name] FMW:[id number] DOB: 1946-03-10 DOA: 04/30/2024     5 DOS: the patient was seen and examined on 05/05/2024   Brief hospital course: 78 y.o. female with medical history significant for chronic functional diarrhea, chronic electrolyte imbalance, prior history of DVT not on anticoagulation, HTN, HLD, CHF, DM, depression who was recently at [facility] between 03/14/2024 and 03/29/2024 for abdominal pain.  At that time she underwent EGD which showed a small hiatal hernia, normal stomach, normal examined duodenum.  S/p colonoscopy showed a large amount of stool in the entire colon precluding visualization, biopsy taken, colon preparation was poor.  Pathology showed duodenal mucosa with villous architecture.  Colonic mucosa was unremarkable and was negative for microscopic colitis, active inflammation and chronic changes.  If biopsies are known diagnostic, patient will need another colonoscopy outpatient with better preparation as per GI.  Patient did not follow through with colonoscopy nor fecal calprotectin study 06/2022.  Patient was started on scheduled Imodium  per GI recommendation that time.   Today patient came into the hospital complaining of lysed weakness at home to the point that she had to use walker.  She says she has been having usual diarrhea no problem.  She has no problem eating.  She denies any other symptoms.   ED Course: Upon arrival to the ED, patient is found to be severely low potassium at less than 2.0, low calcium  at 6.4, high anion gap at 17, bicarb was 32.  White count was normal.  Patient denied any other symptoms.  Patient was replaced with potassium and calcium  and hospital service was consulted for evaluation for admission for generalized weakness and electrolyte abnormalities.   12/7: Patient states that she has had diarrhea since August.  Coming in with electrolyte abnormalities and diarrhea.  Case discussed with gastroenterology and may have  an overflow constipation and pancreatic insufficiency will try creon . 12/8: Patient complains of blurry vision today.  She states she is blind in her right eye and has blurry vision in her left eye.  Patient able to move her legs better today.  Still having some diarrhea. 12/9: Patient feeling a little bit better.  Blurry vision little less.  Will follow-up with eye doctor upon discharge in a week.  Still having diarrhea.  Still with low potassium 2.9 12/10: Reports feeling the same, no improvement in diarrhea.  After prolonged discussion at the bedside, agreed to try Lomotil  instead of Imodium . Oncology consulted due to elevated chromogranin A. Discussed with GI and pharmacy, Adeline not available in house 12/11: Potassium trended up, discontinue potassium supplements.  Denies any complaints today, workup pending.  Placed Foley's catheter for 24-hour urine collection  Assessment and Plan: Severe Hypokalemia - resolved Mild hyperkalemia  Severe hypokalemia on presentation, patient was on potassium po 40mEq x TID Discontinue potassium supplementation, On Aldactone  S/p 1 dose Lokelma 10g Repeat potassium later today   Potassium-sensitive periodic paralysis Patient able to move her legs better today than yesterday.  PT evaluation recommending rehab   Hypomagnesemia Replaced aggressively on presentation   Acute on chronic diastolic CHF (congestive heart failure) (HCC) Patient needed a dose of Lasix  on the evening of 12/8 with aggressive IV electrolyte supplementation.  Patient on Toprol  and losartan .  Add Aldactone .   Diarrhea of unknown origin Stool studies negative, stool osm pending, likely secretory S/p EGD, colonoscopy -biopsy normal - Chromogranin A elevated 693.7, ?  Neuroendocrine tumors - Seen by GI and oncology, appreciate recs - CT C/A/P  with contrast negative for findings suspicious for malignancy.  Stable 3 cm posterior mediastinal mass, favoring benign foregut cyst - 24 hr  urine 5HIAA pending, unable to collect due to ongoing diarrhea, for Foley's catheter placement -refused, states will try with external PureWick catheter - Agreed to try Lomotil , discontinued Imodium    Fever Possible pneumonia.  On Rocephin  and Zithromax.  Staph species in blood culture likely contaminant. Repeat Bcx pending   Sinus bradycardia On Toprol -XL   Hyperlipidemia, unspecified On Crestor    Generalized weakness Physical therapy recommending rehab   Blurry vision, left eye Appreciate ophthalmology evaluation.  Will need close follow-up as outpatient.  Continue eyedrops.   Hypocalcemia Replaced IV previously continue oral supplementation.   Mediastinal mass Seen on imaging back in 2024 with benign appearance.  Follow-up as outpatient   Uncontrolled type 2 diabetes mellitus with hypoglycemia, without long-term current use of insulin  (HCC) Discontinue sliding scale since last hemoglobin A1c 6.4.  Initially on D10 drip.   Tobacco abuse Stop smoking   Benign essential HTN On Norvasc  and metoprolol , losartan , Aldactone   Subjective: Patient states she is still not sure if Lomotil  helps Reports having 3 BMs in the last 12 hours, she feels like it is slowing down Had multiple questions which have been answered at the bedside  Physical Exam: Vitals:   05/04/24 1443 05/04/24 2012 05/05/24 0300 05/05/24 0800  BP: (!) 119/57 (!) 142/86 (!) 180/72 (!) 159/65  Pulse: 66 (!) 53 61 64  Resp: 17 16 17 18   Temp: 97.8 F (36.6 C) 98 F (36.7 C) 98.5 F (36.9 C) 97.8 F (36.6 C)  TempSrc: Oral Oral Oral   SpO2: 98% 90% 97% 98%  Weight:      Height:       HENT:     Head: Normocephalic.  Eyes:     General: Lids are normal.     Conjunctiva/sclera: Conjunctivae normal.  Cardiovascular:     Rate and Rhythm: Normal rate and regular rhythm.     Heart sounds: S1 normal and S2 normal. Murmur heard.     Systolic murmur is present with a grade of 2/6.  Pulmonary:     Breath  sounds: Examination of the right-lower field reveals decreased breath sounds. Examination of the left-lower field reveals decreased breath sounds. Decreased breath sounds present. No wheezing, rhonchi or rales.  Abdominal:     Palpations: Abdomen is soft.     Tenderness: There is no abdominal tenderness.  Musculoskeletal:     Right lower leg: No swelling.     Left lower leg: No swelling.  Skin:    General: Skin is warm.     Findings: No rash.  Neurological:     Mental Status: She is alert and oriented to person, place, and time.     Comments: Patient able to lift her legs up off the bed.    Data Reviewed: Labs and Imaging reviewed  Family Communication: None at bedside  Disposition: Status is: Inpatient Remains inpatient appropriate because: work up pending  Planned Discharge Destination: Physical therapy recommending rehab    Time spent: 52 minutes  Author: Laree Lock, MD 05/05/2024 2:19 PM  For on call review [email or web address].

## 2024-05-05 NOTE — Progress Notes (Addendum)
 PHARMACY CONSULT NOTE - FOLLOW UP  Pharmacy Consult for Electrolyte Monitoring and Replacement   Recent Labs: Potassium (mmol/L)  Date Value  05/05/2024 5.9 (H)   Magnesium  (mg/dL)  Date Value  87/88/7974 1.9   Calcium  (mg/dL)  Date Value  87/88/7974 8.4 (L)   Albumin (g/dL)  Date Value  87/92/7974 2.9 (L)  09/25/2015 4.4   Phosphorus (mg/dL)  Date Value  87/90/7974 2.6   Sodium (mmol/L)  Date Value  05/05/2024 136  09/25/2015 142   Assessment: 78 yo F with PMH including chronic diarrhea and electrolyte imbalances presenting with abdominal pain.  Medications: Calcium  carbonate 1250mg  BID, losartan , KCL 40 meq po TID, Spirolactone added 12/9  Goal of Therapy:  Electrolytes WNL   Plan: K 5.9- Recommend DC KCL TID  Recommended order potassium binder like Lokelma.   Would check next K 4 hours after Lokelma  Check electrolytes with AM labs  Estill CHRISTELLA Lutes, PharmD, BCPS Clinical Pharmacist 05/05/2024 7:27 AM

## 2024-05-05 NOTE — Plan of Care (Signed)

## 2024-05-05 NOTE — Progress Notes (Signed)
 The patient is resting comfortably in no apparent distress.  I spoke to the nurse who states that the patient's diarrhea is somewhat better today.  The patient was started on Lomotil .  She is continuing the Imodium  also.  The patient is involved with a workup by oncology for the elevated chromogranin A.  The patient's electrolytes have also improved.  Nothing further to add at the time from a GI appointment.

## 2024-05-06 LAB — BASIC METABOLIC PANEL WITH GFR
Anion gap: 7 (ref 5–15)
BUN: 10 mg/dL (ref 8–23)
CO2: 25 mmol/L (ref 22–32)
Calcium: 8.9 mg/dL (ref 8.9–10.3)
Chloride: 104 mmol/L (ref 98–111)
Creatinine, Ser: 0.76 mg/dL (ref 0.44–1.00)
GFR, Estimated: >60 mL/min (ref >60)
Glucose, Bld: 143 mg/dL — ABNORMAL HIGH (ref 70–99)
Potassium: 5.1 mmol/L (ref 3.5–5.1)
Sodium: 136 mmol/L (ref 135–145)

## 2024-05-06 LAB — GLUCOSE, CAPILLARY
Glucose-Capillary: 138 mg/dL — ABNORMAL HIGH (ref 70–99)
Glucose-Capillary: 154 mg/dL — ABNORMAL HIGH (ref 70–99)
Glucose-Capillary: 197 mg/dL — ABNORMAL HIGH (ref 70–99)
Glucose-Capillary: 154 mg/dL — ABNORMAL HIGH (ref 70–99)

## 2024-05-06 LAB — CBC
HCT: 31.4 % — ABNORMAL LOW (ref 36.0–46.0)
Hemoglobin: 10.1 g/dL — ABNORMAL LOW (ref 12.0–15.0)
MCH: 27.6 pg (ref 26.0–34.0)
MCHC: 32.2 g/dL (ref 30.0–36.0)
MCV: 85.8 fL (ref 80.0–100.0)
Platelets: 283 K/uL (ref 150–400)
RBC: 3.66 MIL/uL — ABNORMAL LOW (ref 3.87–5.11)
RDW: 19.7 % — ABNORMAL HIGH (ref 11.5–15.5)
WBC: 6.1 K/uL (ref 4.0–10.5)
nRBC: 0 % (ref 0.0–0.2)

## 2024-05-06 LAB — MAGNESIUM: Magnesium: 1.7 mg/dL (ref 1.7–2.4)

## 2024-05-06 MED ORDER — ENSURE PLUS HIGH PROTEIN PO LIQD
237.0000 mL | Freq: Two times a day (BID) | ORAL | Status: DC
  Administered 2024-05-06: 237 mL via ORAL

## 2024-05-06 MED ADMIN — Amlodipine Besylate Tab 10 MG (Base Equivalent): 10 mg | ORAL | NDC 68382012316

## 2024-05-06 MED FILL — Amlodipine Besylate Tab 10 MG (Base Equivalent): 10.0000 mg | ORAL | Qty: 1 | Status: AC

## 2024-05-06 NOTE — Progress Notes (Addendum)
 Lisa Copping, MD [facility]   [address] [place], [place] [address] Phone: [phone] Fax : [phone]   Subjective: The patient reports that her diarrhea is much better than it was since we added the Lomotil  yesterday.  The patient is being followed by oncology for a elevated chromogranin A.  The GI workup has been negative so far for any intraluminal cause of the diarrhea and weight loss.  The patient's stool samples were negative and the patient's biopsies were also normal.   Objective: Vital signs in last 24 hours: Vitals:   05/05/24 1904 05/05/24 2013 05/06/24 0036 05/06/24 0730  BP: (!) 161/68 (!) 155/62 (!) 170/76 (!) 198/81  Pulse: 64 64 62 60  Resp: 20 18 17    Temp: 97.8 F (36.6 C) 98.6 F (37 C)  97.7 F (36.5 C)  TempSrc:    Oral  SpO2: 100% 98% 98% 99%  Weight:      Height:       Weight change:   Intake/Output Summary (Last 24 hours) at 05/06/2024 9147 Last data filed at 05/05/2024 1953 Gross per 24 hour  Intake 480 ml  Output --  Net 480 ml     Exam: General:    Lab Results: @LABTEST2 @ Micro Results: Recent Results (from the past 240 hours)  C Difficile Quick Screen w PCR reflex     Status: None   Collection Time: 05/01/24  7:22 AM   Specimen: STOOL  Result Value Ref Range Status   C Diff antigen NEGATIVE NEGATIVE Final   C Diff toxin NEGATIVE NEGATIVE Final   C Diff interpretation No C. difficile detected.  Final    Comment: Performed at [facility], [address] [place], [place] [address]  Gastrointestinal Panel by PCR , Stool     Status: None   Collection Time: 05/01/24  7:22 AM   Specimen: Stool  Result Value Ref Range Status   Campylobacter species NOT DETECTED NOT DETECTED Final   Plesimonas shigelloides NOT DETECTED NOT DETECTED Final   Salmonella species NOT DETECTED NOT DETECTED Final   Yersinia enterocolitica NOT DETECTED NOT DETECTED Final   Vibrio species NOT DETECTED NOT DETECTED Final   Vibrio  cholerae NOT DETECTED NOT DETECTED Final   Enteroaggregative E coli (EAEC) NOT DETECTED NOT DETECTED Final   Enteropathogenic E coli (EPEC) NOT DETECTED NOT DETECTED Final   Enterotoxigenic E coli (ETEC) NOT DETECTED NOT DETECTED Final   Shiga like toxin producing E coli (STEC) NOT DETECTED NOT DETECTED Final   Shigella/Enteroinvasive E coli (EIEC) NOT DETECTED NOT DETECTED Final   Cryptosporidium NOT DETECTED NOT DETECTED Final   Cyclospora cayetanensis NOT DETECTED NOT DETECTED Final   Entamoeba histolytica NOT DETECTED NOT DETECTED Final   Giardia lamblia NOT DETECTED NOT DETECTED Final   Adenovirus F40/41 NOT DETECTED NOT DETECTED Final   Astrovirus NOT DETECTED NOT DETECTED Final   Norovirus GI/GII NOT DETECTED NOT DETECTED Final   Rotavirus A NOT DETECTED NOT DETECTED Final   Sapovirus (I, II, IV, and V) NOT DETECTED NOT DETECTED Final    Comment: Performed at [facility], [address] [place], [place] [address]  Culture, blood (Routine X 2) w Reflex to ID Panel     Status: None (Preliminary result)   Collection Time: 05/02/24  1:51 PM   Specimen: BLOOD  Result Value Ref Range Status   Specimen Description BLOOD BLOOD LEFT ARM  Final   Special Requests   Final  BOTTLES DRAWN AEROBIC AND ANAEROBIC Blood Culture results may not be optimal due to an inadequate volume of blood received in culture bottles   Culture   Final    NO GROWTH 4 DAYS Performed at [facility], [address] [place], [place] [address]    Report Status PENDING  Incomplete  Culture, blood (Routine X 2) w Reflex to ID Panel     Status: Abnormal   Collection Time: 05/02/24  1:51 PM   Specimen: BLOOD  Result Value Ref Range Status   Specimen Description   Final    BLOOD BLOOD LEFT HAND Performed at [facility], [address], [place], [place] [address]    Special Requests   Final    BOTTLES DRAWN AEROBIC AND ANAEROBIC Blood Culture adequate volume Performed at [facility], [address] [place], [place] [address]    Culture  Setup Time   Final    GRAM POSITIVE COCCI ANAEROBIC BOTTLE ONLY CRITICAL RESULT CALLED TO, READ BACK BY AND VERIFIED WITH:  NATHAN BELUE AT 0540 05/03/24 JG    Culture (A)  Final    STAPHYLOCOCCUS WARNERI THE SIGNIFICANCE OF ISOLATING THIS ORGANISM FROM A SINGLE SET OF BLOOD CULTURES WHEN MULTIPLE SETS ARE DRAWN IS UNCERTAIN. PLEASE NOTIFY THE MICROBIOLOGY DEPARTMENT WITHIN ONE WEEK IF SPECIATION AND SENSITIVITIES ARE REQUIRED. Performed at [facility], [address] [place], [place] [address]    Report Status 05/05/2024 FINAL  Final  Blood Culture ID Panel (Reflexed)     Status: Abnormal   Collection Time: 05/02/24  1:51 PM  Result Value Ref Range Status   Enterococcus faecalis NOT DETECTED NOT DETECTED Final   Enterococcus Faecium NOT DETECTED NOT DETECTED Final   Listeria monocytogenes NOT DETECTED NOT DETECTED Final   Staphylococcus species DETECTED (A) NOT DETECTED Final    Comment: CRITICAL RESULT CALLED TO, READ BACK BY AND VERIFIED WITH:  NATHAN BELUE AT 0540 05/03/24 JG    Staphylococcus aureus (BCID) NOT DETECTED NOT DETECTED Final   Staphylococcus epidermidis NOT DETECTED NOT DETECTED Final   Staphylococcus lugdunensis NOT DETECTED NOT DETECTED Final   Streptococcus species NOT DETECTED NOT DETECTED Final   Streptococcus agalactiae NOT DETECTED NOT DETECTED Final   Streptococcus pneumoniae NOT DETECTED NOT DETECTED Final   Streptococcus pyogenes NOT DETECTED NOT DETECTED Final   A.calcoaceticus-baumannii NOT DETECTED NOT DETECTED Final   Bacteroides fragilis NOT DETECTED NOT DETECTED Final   Enterobacterales NOT DETECTED NOT DETECTED Final   Enterobacter cloacae complex NOT DETECTED NOT DETECTED Final   Escherichia coli NOT DETECTED NOT DETECTED Final   Klebsiella aerogenes NOT DETECTED NOT DETECTED Final   Klebsiella oxytoca NOT DETECTED NOT DETECTED Final   Klebsiella pneumoniae NOT  DETECTED NOT DETECTED Final   Proteus species NOT DETECTED NOT DETECTED Final   Salmonella species NOT DETECTED NOT DETECTED Final   Serratia marcescens NOT DETECTED NOT DETECTED Final   Haemophilus influenzae NOT DETECTED NOT DETECTED Final   Neisseria meningitidis NOT DETECTED NOT DETECTED Final   Pseudomonas aeruginosa NOT DETECTED NOT DETECTED Final   Stenotrophomonas maltophilia NOT DETECTED NOT DETECTED Final   Candida albicans NOT DETECTED NOT DETECTED Final   Candida auris NOT DETECTED NOT DETECTED Final   Candida glabrata NOT DETECTED NOT DETECTED Final   Candida krusei NOT DETECTED NOT DETECTED Final   Candida parapsilosis NOT DETECTED NOT DETECTED Final   Candida tropicalis NOT DETECTED NOT DETECTED Final   Cryptococcus neoformans/gattii NOT DETECTED NOT DETECTED Final  Comment: Performed at [facility], [address] [place], [place] [address]  Culture, blood (single) w Reflex to ID Panel     Status: None (Preliminary result)   Collection Time: 05/04/24  4:27 PM   Specimen: BLOOD  Result Value Ref Range Status   Specimen Description BLOOD BLOOD LEFT ARM  Final   Special Requests   Final    BOTTLES DRAWN AEROBIC AND ANAEROBIC Blood Culture adequate volume   Culture   Final    NO GROWTH 2 DAYS Performed at [facility], [address], [place], [place] [address]    Report Status PENDING  Incomplete   Studies/Results: CT CHEST ABDOMEN PELVIS W CONTRAST Result Date: 05/04/2024 EXAM: CT CHEST, ABDOMEN AND PELVIS WITH CONTRAST 05/04/2024 05:29:03 PM TECHNIQUE: CT of the chest, abdomen and pelvis was performed with the administration of 100 mL of iohexol  (OMNIPAQUE ) 300 MG/ML solution intravenous contrast. Multiplanar reformatted images are provided for review. Automated exposure control, iterative reconstruction, and/or weight based adjustment of the mA/kV was utilized to reduce the radiation dose to as low as reasonably achievable. COMPARISON: CTA chest  dated 04/08/2023. CLINICAL HISTORY: Mediastinal mass; and diarrhea - rule out carcinoid syndrome - abnormal weight loss. FINDINGS: CHEST: MEDIASTINUM AND LYMPH NODES: Mild cardiomegaly. Moderate coronary atherosclerosis of the LAD and left circumflex. Thoracic aortic atherosclerosis. Stable 3.0 cm mass with benign anterior scalloping of the T4-T5 vertebral bodies, favoring a benign foregut duplication cyst. The central airways are clear. No mediastinal, hilar or axillary lymphadenopathy. LUNGS AND PLEURA: Small bilateral pleural effusions, chronic, with associated lower lobe atelectasis. Faint patchy ground-glass opacity in the right upper lobe (images 25 and 41), favoring post-infectious/inflammatory scarring when correlating with the prior. No pneumothorax. ABDOMEN AND PELVIS: LIVER: The liver is unremarkable. GALLBLADDER AND BILE DUCTS: Gallbladder is unremarkable. No biliary ductal dilatation. SPLEEN: No acute abnormality. PANCREAS: No acute abnormality. ADRENAL GLANDS: No acute abnormality. KIDNEYS, URETERS AND BLADDER: No stones in the kidneys or ureters. No hydronephrosis. No perinephric or periureteral stranding. Urinary bladder is unremarkable. GI AND BOWEL: Stomach demonstrates no acute abnormality. There is no bowel obstruction. REPRODUCTIVE ORGANS: Post hysterectomy. PERITONEUM AND RETROPERITONEUM: No ascites. No free air. VASCULATURE: Aorta is normal in caliber. ABDOMINAL AND PELVIS LYMPH NODES: No lymphadenopathy. BONES AND SOFT TISSUES: Mild degenerative changes of the visualized thoracolumbar spine. The T4-T5 vertebral body scalloping associated with the mediastinal mass is benign and stable. No acute osseous abnormality. No focal soft tissue abnormality. IMPRESSION: 1. 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 2. No findings suspicious for malignancy/metastatic disease. 3. Suspected postinfectious/inflammatory scarring in the right upper lobe. 4. Small  bilateral pleural effusions, chronic. Electronically signed by: Pinkie Pebbles MD 05/04/2024 07:59 PM EST RP Workstation: [id number]   Medications: I have reviewed the patient's current medications. Scheduled Meds:  aspirin  EC  81 mg Oral Daily   atropine   1 drop Right Eye TID   brimonidine   1 drop Left Eye BID   calcium  carbonate  1 tablet Oral BID WC   citalopram   10 mg Oral Daily   clopidogrel   75 mg Oral Daily   cyanocobalamin   1,000 mcg Oral Daily   dorzolamide -timolol   1 drop Left Eye BID   doxycycline   100 mg Oral Q12H   enoxaparin  (LOVENOX ) injection  40 mg Subcutaneous Q24H   gabapentin   100 mg Oral TID   lipase/protease/amylase  72,000 Units Oral TID AC   losartan   100 mg Oral Daily   metoprolol   succinate  12.5 mg Oral Daily   multivitamin with minerals  1 tablet Oral Daily   prednisoLONE  acetate  1 drop Right Eye QID   rosuvastatin   10 mg Oral Daily   spironolactone   25 mg Oral Daily   Continuous Infusions:  cefTRIAXone  (ROCEPHIN )  IV Stopped (05/05/24 2134)   PRN Meds:.acetaminophen  **OR** acetaminophen , artificial tears, diphenoxylate -atropine , hydrALAZINE , oxyCODONE    Assessment: Principal Problem:   Hypokalemia Active Problems:   Benign essential HTN   Hyperlipidemia, unspecified   Tobacco abuse   Hypomagnesemia   Chronic diarrhea of unknown origin   History of DVT (deep vein thrombosis)   Sinus bradycardia   Urinary incontinence   Potassium-sensitive periodic paralysis   Uncontrolled type 2 diabetes mellitus with hypoglycemia, without long-term current use of insulin  (HCC)   Mediastinal mass   Hypocalcemia   Fever   Blurry vision, left eye   Generalized weakness   Shortness of breath   Acute on chronic diastolic CHF (congestive heart failure) (HCC)    Plan: This patient has had chronic diarrhea with weight loss and her stool samples have shown stool osmolarity to be unable to be determined due to the the stools being too solid to be tested and  she has had a positive chromogranin a with negative biopsies and negative stool GI panel and C. difficile. The patient is doing much better on Imodium  and Lomotil  and has other labs pending including vasoactive intestinal peptide and 5 HIAA.  There is nothing further to do from a GI point of view at this time and would recommend continuing the Imodium  and Lomotil  on this patient.  The patient has been explained the plan.  I will sign off.  Please call if any further GI concerns or questions.  We would like to thank you for the opportunity to participate in the care of Lisa [name].    LOS: 6 days   Lisa Copping, MD.FACG 05/06/2024, 8:52 AM Pager [phone] 7am-5pm  Check AMION for 5pm -7am coverage and on weekends

## 2024-05-06 NOTE — Progress Notes (Signed)
 OT Cancellation Note  Patient Details Name: Lisa [name] MRN: [id number] DOB: 1946/05/20   Cancelled Treatment:    Reason Eval/Treat Not Completed: Patient declined, no reason specified;Other (comment) (reports wanting to eat ice cream and nap adamantly refused any OOB mobility, requesting OT to return with very specific time requests; OT will attempt later today when able. Educated patient on importance of daily OOB activity, patient contnues to refuse)  Lisa [name] Clause 05/06/2024, 11:31 AM

## 2024-05-06 NOTE — TOC Progression Note (Signed)
 Transition of Care [facility]) - Progression Note    Patient Details  Name: Lisa [name] MRN: [id number] Date of Birth: 11/13/1945  Transition of Care [facility]) CM/SW Contact  Nathanael CHRISTELLA Ring, RN Phone Number: 05/06/2024, 3:13 PM  Clinical Narrative:    Patient will not consider any other SNF's.  Plan will be for discharge tomorrow home.  She is hoping her brother will stay with her for a little while.  She just needs a 3 in 1, has all other needed DME.  Brother will pick her up.  She will have HH services with [facility] notified of expected DC tomorrow.  3 in 1 ordered to be delivered to the bedside.    Expected Discharge Plan: Home w Home Health Services Barriers to Discharge: Continued Medical Work up               Expected Discharge Plan and Services   Discharge Planning Services: CM Consult Post Acute Care Choice: Home Health Living arrangements for the past 2 months: Single Family Home                 DME Arranged: N/A         HH Arranged: RN, PT, OT, Nurse's Aide           Social Drivers of Health (SDOH) Interventions SDOH Screenings   Food Insecurity: No Food Insecurity (05/01/2024)  Housing: Low Risk (05/01/2024)  Transportation Needs: Unmet Transportation Needs (05/01/2024)  Utilities: Not At Risk (05/01/2024)  Alcohol  Screen: Low Risk (02/23/2023)  Depression (PHQ2-9): Low Risk (02/02/2024)  Financial Resource Strain: Medium Risk (04/08/2023)  Social Connections: Patient Declined (05/01/2024)  Recent Concern: Social Connections - Socially Isolated (03/23/2024)  Tobacco Use: High Risk (05/01/2024)    Readmission Risk Interventions     No data to display

## 2024-05-06 NOTE — Progress Notes (Signed)
 Occupational Therapy Treatment Patient Details Name: Lisa [name] MRN: [id number] DOB: 1945-10-27 Today's Date: 05/06/2024   History of present illness presented to ER secondary to progressive weakness, persistent diarrhead; admitted for management of severe electrolyte abnormalities.  Of note, recent hospitalization 10/20-11/4 secondary to abdominal pain; similar course.  GI involved.   OT comments  Patient seen for OT treatment on this date. Upon arrival to room patient resting in bed, required significant coaxing to participate. OT voiced concerns over patient dcing home alone, reports her brother should be able to stay with her but she isn't sure. OT discussed importance of daily OOB mobility, patient refusing but then reports I'm peeing on myself, patient made no efforts to transfer onto [facility], max coaxing to transition to EOB to stand for thorough hygiene and bed change, patient performs mobility with CGA/SBA intermittent min A, patient continues to state don't rush me, I do things on my own time. Increased time required for patient to engage. OT performed incontinence care, patient returned to bed. Patient feels comfortable discharging home, OT voiced more concerns and notified team of concerns. Patient ended treatment in bed with bed/chair alarm on and all needs within reach. Patient making fair/limited progress toward goals, will continue to follow POC. Discharge recommendation remains appropriate.        If plan is discharge home, recommend the following:  Two people to help with walking and/or transfers;Two people to help with bathing/dressing/bathroom;Help with stairs or ramp for entrance   Equipment Recommendations  Other (comment)    Recommendations for Other Services      Precautions / Restrictions Precautions Precautions: Fall Recall of Precautions/Restrictions: Intact Restrictions Weight Bearing Restrictions Per Provider Order: No       Mobility Bed  Mobility Overal bed mobility: Needs Assistance Bed Mobility: Supine to Sit Rolling: Supervision   Supine to sit: Supervision Sit to supine: Supervision        Transfers Overall transfer level: Needs assistance Equipment used: Rolling walker (2 wheels) Transfers: Sit to/from Stand, Bed to chair/wheelchair/BSC Sit to Stand: Contact guard assist           General transfer comment: CGA     Balance Overall balance assessment: Needs assistance Sitting-balance support: No upper extremity supported Sitting balance-Leahy Scale: Good     Standing balance support: Bilateral upper extremity supported Standing balance-Leahy Scale: Fair                             ADL either performed or assessed with clinical judgement   ADL Overall ADL's : Needs assistance/impaired                             Toileting- Clothing Manipulation and Hygiene: Maximal assistance Toileting - Clothing Manipulation Details (indicate cue type and reason): max A for incontinence care       General ADL Comments: incontinent at start of tx, increased time to void    Extremity/Trunk Assessment              Vision       Perception     Praxis     Communication Communication Communication: No apparent difficulties   Cognition Arousal: Alert Behavior During Therapy: Flat affect                                 Following  commands: Intact        Cueing   Cueing Techniques: Verbal cues  Exercises      Shoulder Instructions       General Comments      Pertinent Vitals/ Pain       Pain Assessment Pain Assessment: No/denies pain  Home Living                                          Prior Functioning/Environment              Frequency  Min 2X/week        Progress Toward Goals  OT Goals(current goals can now be found in the care plan section)  Progress towards OT goals: Not progressing toward goals - comment  (limited participation)  Acute Rehab OT Goals Patient Stated Goal: to go home OT Goal Formulation: With patient Time For Goal Achievement: 05/16/24 Potential to Achieve Goals: Good ADL Goals Pt Will Perform Grooming: with min assist;sitting Pt Will Perform Lower Body Dressing: with min assist;sit to/from stand Pt Will Transfer to Toilet: with contact guard assist;bedside commode  Plan      Co-evaluation                 AM-PAC OT 6 Clicks Daily Activity     Outcome Measure   Help from another person eating meals?: None Help from another person taking care of personal grooming?: A Little Help from another person toileting, which includes using toliet, bedpan, or urinal?: A Little Help from another person bathing (including washing, rinsing, drying)?: A Lot Help from another person to put on and taking off regular upper body clothing?: A Little Help from another person to put on and taking off regular lower body clothing?: A Lot 6 Click Score: 17    End of Session Equipment Utilized During Treatment: Rolling walker (2 wheels);Oxygen  OT Visit Diagnosis: Unsteadiness on feet (R26.81);Other abnormalities of gait and mobility (R26.89);Muscle weakness (generalized) (M62.81)   Activity Tolerance Patient limited by fatigue   Patient Left in bed;with bed alarm set;with call bell/phone within reach   Nurse Communication Mobility status        Time: 8692-8671 OT Time Calculation (min): 21 min  Charges: OT General Charges $OT Visit: 1 Visit OT Treatments $Self Care/Home Management : 8-22 mins  Rogers Clause, OT/L MSOT, 05/06/2024

## 2024-05-06 NOTE — Progress Notes (Signed)
°   05/06/24 1345  Spiritual Encounters  Type of Visit Follow up  Care provided to: Patient  Conversation partners present during encounter Nurse  Reason for visit Routine spiritual support  OnCall Visit No  Spiritual Framework  Presenting Themes Goals in life/care;Meaning/purpose/sources of inspiration;Values and beliefs;Significant life change;Caregiving needs;Coping tools;Impactful experiences and emotions;Courage hope and growth;Community and relationships  Community/Connection Family;Friend(s);Faith community  Patient Stress Factors Health changes;Loss of control;Major life changes  Family Stress Factors None identified  Interventions  Spiritual Care Interventions Made Established relationship of care and support;Compassionate presence;Reflective listening;Meaning making;Prayer  Intervention Outcomes  Outcomes Awareness of health;Connection to spiritual care;Reduced fear;Reduced anxiety  Spiritual Care Plan  Spiritual Care Issues Still Outstanding Chaplain will continue to follow   Chaplain visited patient as promised to share more about health and wellness challenges patient is facing. Patient seemed calmer after visit and asked for prayer. Chaplain Prayed at bedside with patient.  Azelia Reiger Chaplain Intern- [facility]

## 2024-05-06 NOTE — TOC CM/SW Note (Signed)
 Patient is not able to walk the distance required to go the bathroom, or he/she is unable to safely negotiate stairs required to access the bathroom.  A 3in1 BSC will alleviate this problem

## 2024-05-06 NOTE — Progress Notes (Signed)
 [facility]  - [place] at [facility]   PATIENT NAME: Lisa [name]    MR#:  [id number]  DATE OF BIRTH:  1946-01-05  SUBJECTIVE:      VITALS:  Blood pressure (!) 151/61, pulse 62, temperature 98.7 F (37.1 C), temperature source Oral, resp. rate 17, height 5' 4 (1.626 m), weight 57.5 kg, SpO2 99%.  PHYSICAL EXAMINATION:   GENERAL:  78 y.o.-year-old patient with no acute distress.  LUNGS: Normal breath sounds bilaterally, no wheezing CARDIOVASCULAR: S1, S2 normal. No murmur   ABDOMEN: Soft, nontender, nondistended. Bowel sounds present.  EXTREMITIES: No  edema b/l.    NEUROLOGIC: nonfocal  patient is alert and awake SKIN: No obvious rash, lesion, or ulcer.   LABORATORY PANEL:  CBC Recent Labs  Lab 05/06/24 0616  WBC 6.1  HGB 10.1*  HCT 31.4*  PLT 283    Chemistries  Recent Labs  Lab 05/01/24 0612 05/01/24 1614 05/06/24 0616  NA 142   < > 136  K 2.1*   < > 5.1  CL 100   < > 104  CO2 30   < > 25  GLUCOSE 137*   < > 143*  BUN 13   < > 10  CREATININE 0.73   < > 0.76  CALCIUM  6.6*   < > 8.9  MG 0.8*   < > 1.7  AST 38  --   --   ALT 17  --   --   ALKPHOS 73  --   --   BILITOT 0.6  --   --    < > = values in this interval not displayed.   Cardiac Enzymes No results for input(s): TROPONINI in the last 168 hours. RADIOLOGY:  CT CHEST ABDOMEN PELVIS W CONTRAST Result Date: 05/04/2024 EXAM: CT CHEST, ABDOMEN AND PELVIS WITH CONTRAST 05/04/2024 05:29:03 PM TECHNIQUE: CT of the chest, abdomen and pelvis was performed with the administration of 100 mL of iohexol  (OMNIPAQUE ) 300 MG/ML solution intravenous contrast. Multiplanar reformatted images are provided for review. Automated exposure control, iterative reconstruction, and/or weight based adjustment of the mA/kV was utilized to reduce the radiation dose to as low as reasonably achievable. COMPARISON: CTA chest dated 04/08/2023. CLINICAL HISTORY: Mediastinal mass; and diarrhea - rule out carcinoid  syndrome - abnormal weight loss. FINDINGS: CHEST: MEDIASTINUM AND LYMPH NODES: Mild cardiomegaly. Moderate coronary atherosclerosis of the LAD and left circumflex. Thoracic aortic atherosclerosis. Stable 3.0 cm mass with benign anterior scalloping of the T4-T5 vertebral bodies, favoring a benign foregut duplication cyst. The central airways are clear. No mediastinal, hilar or axillary lymphadenopathy. LUNGS AND PLEURA: Small bilateral pleural effusions, chronic, with associated lower lobe atelectasis. Faint patchy ground-glass opacity in the right upper lobe (images 25 and 41), favoring post-infectious/inflammatory scarring when correlating with the prior. No pneumothorax. ABDOMEN AND PELVIS: LIVER: The liver is unremarkable. GALLBLADDER AND BILE DUCTS: Gallbladder is unremarkable. No biliary ductal dilatation. SPLEEN: No acute abnormality. PANCREAS: No acute abnormality. ADRENAL GLANDS: No acute abnormality. KIDNEYS, URETERS AND BLADDER: No stones in the kidneys or ureters. No hydronephrosis. No perinephric or periureteral stranding. Urinary bladder is unremarkable. GI AND BOWEL: Stomach demonstrates no acute abnormality. There is no bowel obstruction. REPRODUCTIVE ORGANS: Post hysterectomy. PERITONEUM AND RETROPERITONEUM: No ascites. No free air. VASCULATURE: Aorta is normal in caliber. ABDOMINAL AND PELVIS LYMPH NODES: No lymphadenopathy. BONES AND SOFT TISSUES: Mild degenerative changes of the visualized thoracolumbar spine. The T4-T5 vertebral body scalloping associated with the mediastinal mass is benign and stable. No  acute osseous abnormality. No focal soft tissue abnormality. IMPRESSION: 1. 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 2. No findings suspicious for malignancy/metastatic disease. 3. Suspected postinfectious/inflammatory scarring in the right upper lobe. 4. Small bilateral pleural effusions, chronic. Electronically signed by: Pinkie Pebbles MD  05/04/2024 07:59 PM EST RP Workstation: [id number]    Assessment and Plan  78 y.o. female with medical history significant for chronic functional diarrhea, chronic electrolyte imbalance, prior history of DVT not on anticoagulation, HTN, HLD, CHF, DM, depression who was recently at [facility] between 03/14/2024 and 03/29/2024 for abdominal pain.  At that time she underwent EGD which showed a small hiatal hernia, normal stomach, normal examined duodenum.  S/p colonoscopy showed a large amount of stool in the entire colon precluding visualization, biopsy taken, colon preparation was poor.  Pathology showed duodenal mucosa with villous architecture.  Colonic mucosa was unremarkable and was negative for microscopic colitis, active inflammation and chronic changes.  If biopsies are known diagnostic, patient will need another colonoscopy outpatient with better preparation as per GI.  Patient did not follow through with colonoscopy nor fecal calprotectin study 06/2022.  Patient was started on scheduled Imodium  per GI recommendation that time.    Severe Hypokalemia - resolved Mild hyperkalemia  Severe hypokalemia on presentation, patient was on potassium po 40mEq x TID Discontinue potassium supplementation, On Aldactone  S/p 1 dose Lokelma 10g K is 5.1   Potassium-sensitive periodic paralysis Patient able to move her legs better today than yesterday.  PT evaluation recommending rehab--pt is refusiong rehab. Wants to go to [facility] only and they don't have any beds pTOC this week. --pt tells me her brother will stay with her.   Hypomagnesemia Replaced aggressively on presentation   Acute on chronic diastolic CHF (congestive heart failure) (HCC) Patient needed a dose of Lasix  on the evening of 12/8 with aggressive IV electrolyte supplementation.  Patient on Toprol  and losartan .  Add Aldactone . Stas on RA >99%   Diarrhea of unknown origin--chronic  --Stool studies negative, stool osm pending, likely  secretory --S/p EGD, colonoscopy -biopsy normal - Chromogranin A elevated 693.7, ?  Neuroendocrine tumors?IBS - Seen by GI and oncology, appreciate recs - CT C/A/P with contrast negative for findings suspicious for malignancy.  Stable 3 cm posterior mediastinal mass, favoring benign foregut cyst - 24 hr urine 5HIAA pending, unable to collect due to ongoing diarrhea, for Foley's catheter placement -refused, states will try with external PureWick catheter--RN unable to collect properly - Agreed to try Lomotil ,--only 1 episode of stool today   Fever Possible pneumonia.  Staph species in blood culture likely contaminant. Repeat Bcx negative --clinically does not seem to be PNA --complete 5 days of doxycyline   Sinus bradycardia On Toprol -XL   Hyperlipidemia, unspecified On Crestor    Generalized weakness Physical therapy recommending rehab--pt is ONLY wanting [facility]--no beds. She wants to otherwise go home with [facility]   Blurry vision, left eye Appreciate ophthalmology evaluation.  Will need close follow-up as outpatient.  Continue eyedrops.   Hypocalcemia Replaced IV previously continue oral supplementation. resolved   Mediastinal mass Seen on imaging back in 2024 with benign appearance.  Follow-up as outpatient   Uncontrolled type 2 diabetes mellitus with hypoglycemia, without long-term current use of insulin  (HCC) Discontinue sliding scale since last hemoglobin A1c 6.4.  Initially on D10 drip--now d/ced   Tobacco abuse Stop smoking   Benign essential HTN On Norvasc  and metoprolol , losartan , Aldactone     Procedures: Family communication :spoke with  brother [name] Consults :GI, CODE STATUS: full DVT Prophylaxis :lovenox  Level of care: Telemetry Status is: Inpatient Remains inpatient appropriate because: waiting to work with PT, TOC d/c plans to be dicussed    TOTAL TIME TAKING CARE OF THIS PATIENT: 35 minutes.  >50% time spent on counselling and coordination of care  Note:  This dictation was prepared with Dragon dictation along with smaller phrase technology. Any transcriptional errors that result from this process are unintentional.  Leita Blanch M.D    [facility]   CC: Primary care physician; Sowles, Krichna, MD

## 2024-05-06 NOTE — Progress Notes (Signed)
 Discussed with patient results of the CT scan not suggestive of any obvious malignancy.  No evidence of any liver metastasis.  Chromogranin A mildly elevated.  Patient declines Foley catheter placement-for collection of 24-hour 5-HIAA.  At discharge patient will follow-up with the cancer center-for further workup-including gallium tolerated PET scan for workup of possible neuroendocrine tumor-causing her diarrhea.  Discussed with Dr.Patel.

## 2024-05-06 NOTE — Progress Notes (Addendum)
 PHARMACY CONSULT NOTE - FOLLOW UP  Pharmacy Consult for Electrolyte Monitoring and Replacement   Recent Labs: Potassium (mmol/L)  Date Value  05/06/2024 5.1   Magnesium  (mg/dL)  Date Value  87/87/7974 1.7   Calcium  (mg/dL)  Date Value  87/87/7974 8.9   Albumin (g/dL)  Date Value  87/92/7974 2.9 (L)  09/25/2015 4.4   Phosphorus (mg/dL)  Date Value  87/90/7974 2.6   Sodium (mmol/L)  Date Value  05/06/2024 136  09/25/2015 142   Assessment: 78 yo F with PMH including chronic diarrhea and electrolyte imbalances presenting with abdominal pain.  Medications: Calcium  carbonate 1250mg  BID, losartan , KCL 40 meq po TID, Spirolactone added 12/9  Goal of Therapy:  Electrolytes WNL   Plan: Mg 1.7- Will order Mag Sulfate 2g IV x 1 dose  No additional replacement warranted at this time Check electrolytes with AM labs  Estill CHRISTELLA Lutes, PharmD, BCPS Clinical Pharmacist 05/06/2024 7:11 AM

## 2024-05-07 ENCOUNTER — Other Ambulatory Visit: Payer: Self-pay

## 2024-05-07 LAB — CULTURE, BLOOD (ROUTINE X 2): Culture: NO GROWTH

## 2024-05-07 LAB — BASIC METABOLIC PANEL WITH GFR
Anion gap: 8 (ref 5–15)
BUN: 10 mg/dL (ref 8–23)
CO2: 26 mmol/L (ref 22–32)
Calcium: 8.8 mg/dL — ABNORMAL LOW (ref 8.9–10.3)
Chloride: 103 mmol/L (ref 98–111)
Creatinine, Ser: 0.7 mg/dL (ref 0.44–1.00)
GFR, Estimated: >60 mL/min (ref >60)
Glucose, Bld: 170 mg/dL — ABNORMAL HIGH (ref 70–99)
Potassium: 4.6 mmol/L (ref 3.5–5.1)
Sodium: 137 mmol/L (ref 135–145)

## 2024-05-07 LAB — MAGNESIUM: Magnesium: 1.5 mg/dL — ABNORMAL LOW (ref 1.7–2.4)

## 2024-05-07 LAB — GLUCOSE, CAPILLARY
Glucose-Capillary: 201 mg/dL — ABNORMAL HIGH (ref 70–99)
Glucose-Capillary: 179 mg/dL — ABNORMAL HIGH (ref 70–99)

## 2024-05-07 MED ORDER — PANCRELIPASE (LIP-PROT-AMYL) 36000-114000 UNITS PO CPEP: 72000.0000 [IU] | ORAL_CAPSULE | Freq: Three times a day (TID) | ORAL | 1 refills | Status: AC

## 2024-05-07 MED ORDER — PANCRELIPASE (LIP-PROT-AMYL) 36000-114000 UNITS PO CPEP
72000.0000 [IU] | ORAL_CAPSULE | Freq: Three times a day (TID) | ORAL | 1 refills | Status: DC
  Filled 2024-05-07: qty 100, 17d supply, fill #0

## 2024-05-07 MED ORDER — METOPROLOL SUCCINATE ER 25 MG PO TB24
12.5000 mg | ORAL_TABLET | Freq: Every day | ORAL | 0 refills | Status: AC
  Filled 2024-05-07: qty 15, 30d supply, fill #0

## 2024-05-07 MED ORDER — MAGNESIUM SULFATE 4 GM/100ML IV SOLN
4.0000 g | Freq: Once | INTRAVENOUS | Status: AC
  Administered 2024-05-07: 4 g via INTRAVENOUS
  Filled 2024-05-07: qty 100

## 2024-05-07 MED ORDER — DIPHENOXYLATE-ATROPINE 2.5-0.025 MG PO TABS
1.0000 | ORAL_TABLET | Freq: Three times a day (TID) | ORAL | 0 refills | Status: AC | PRN
  Filled 2024-05-07: qty 20, 7d supply, fill #0

## 2024-05-07 MED ADMIN — Amlodipine Besylate Tab 10 MG (Base Equivalent): 10 mg | ORAL | NDC 68382012316

## 2024-05-07 NOTE — Progress Notes (Signed)
 PHARMACY CONSULT NOTE  Pharmacy Consult for Electrolyte Monitoring and Replacement   Recent Labs: Potassium (mmol/L)  Date Value  05/07/2024 4.6   Magnesium  (mg/dL)  Date Value  87/86/7974 1.5 (L)   Calcium  (mg/dL)  Date Value  87/86/7974 8.8 (L)   Albumin (g/dL)  Date Value  87/92/7974 2.9 (L)  09/25/2015 4.4   Phosphorus (mg/dL)  Date Value  87/90/7974 2.6   Sodium (mmol/L)  Date Value  05/07/2024 137  09/25/2015 142   Assessment: 77 yo F with PMH including chronic diarrhea and electrolyte imbalances presenting with abdominal pain.  Goal of Therapy:  Electrolytes WNL   Plan: --Mg 1.5, magnesium  sulfate 4 g IV x 1 dose --Re-check labs tomorrow AM  Lisa [name] 05/07/2024 6:49 AM

## 2024-05-07 NOTE — Discharge Summary (Signed)
 Physician Discharge Summary   Patient: Lisa [name] MRN: [id number] DOB: 20-Aug-1945  Admit date:     04/30/2024  Discharge date: 05/07/2024  Discharge Physician: Lisa [name]   PCP: Sowles, Krichna, MD   Recommendations at discharge:   follow-up PCP in 1 to 2 patient to follow-up OB/GYN Dr. Verdon for urinary incontinence. She or family will have to make appointment follow-up Dr. Rennie to complete workup for neuroendocrine tumor follow-up G.I. Dr. Jinny for chronic diarrhea  Discharge Diagnoses: Principal Problem:   Hypokalemia Active Problems:   Hypomagnesemia   Potassium-sensitive periodic paralysis   Chronic diarrhea of unknown origin   Acute on chronic diastolic CHF (congestive heart failure) (HCC)   Sinus bradycardia   Fever   Hyperlipidemia, unspecified   Benign essential HTN   Tobacco abuse   History of DVT (deep vein thrombosis)   Urinary incontinence   Uncontrolled type 2 diabetes mellitus with hypoglycemia, without long-term current use of insulin  (HCC)   Mediastinal mass   Hypocalcemia   Blurry vision, left eye   Generalized weakness   Shortness of breath 78 y.o. female with medical history significant for chronic functional diarrhea, chronic electrolyte imbalance, prior history of DVT not on anticoagulation, HTN, HLD, CHF, DM, depression who was recently at [facility] between 03/14/2024 and 03/29/2024 for abdominal pain.  At that time she underwent EGD which showed a small hiatal hernia, normal stomach, normal examined duodenum.  S/p colonoscopy showed a large amount of stool in the entire colon precluding visualization, biopsy taken, colon preparation was poor.  Pathology showed duodenal mucosa with villous architecture.  Colonic mucosa was unremarkable and was negative for microscopic colitis, active inflammation and chronic changes.  If biopsies are known diagnostic, patient will need another colonoscopy outpatient with better preparation as per GI.  Patient  did not follow through with colonoscopy nor fecal calprotectin study 06/2022.  Patient was started on scheduled Imodium  per GI recommendation that time.     Severe Hypokalemia - resolved Mild hyperkalemia  Severe hypokalemia on presentation, patient was on potassium po 40mEq x TID Discontinue potassium supplementation, On Aldactone  S/p 1 dose Lokelma  10g K is 5.1   Potassium-sensitive periodic paralysis Patient able to move her legs better today than yesterday.  PT evaluation recommending rehab--pt is refusiong rehab.\ --pt tells me her brother will stay with her.   Hypomagnesemia Replaced aggressively on presentation   Acute on chronic diastolic CHF (congestive heart failure) (HCC) Patient needed a dose of Lasix  on the evening of 12/8 with aggressive IV electrolyte supplementation.  Patient on Toprol  and losartan .  Add Aldactone . Sats on RA >99%   Diarrhea of unknown origin--chronic  --Stool studies negative, stool osm pending, likely secretory --S/p EGD, colonoscopy -biopsy normal - Chromogranin A elevated 693.7, ?  Neuroendocrine tumors?IBS - Seen by GI and oncology, appreciate recs - CT C/A/P with contrast negative for findings suspicious for malignancy.  Stable 3 cm posterior mediastinal mass, favoring benign foregut cyst - 24 hr urine 5HIAA pending, unable to collect due to ongoing diarrhea, for Foley's catheter placement -refused, states will try with external PureWick catheter--RN unable to collect properly - Agreed to try Lomotil ,--only 1 episode of stool today --trial of creon    Fever Possible pneumonia.  Staph species in blood culture likely contaminant. Repeat Bcx negative --clinically does not seem to be PNA --complete 5 days of doxycyline   Sinus bradycardia On Toprol -XL   Hyperlipidemia, unspecified On Crestor    Generalized weakness Physical therapy recommending rehab--pt is ONLY wanting  LC--no beds. She [facility]   Blurry vision,  left eye Appreciate ophthalmology evaluation.  Will need close follow-up as outpatient.  Continue eyedrops.   Hypocalcemia Replaced IV previously continue oral supplementation. resolved   Mediastinal mass Seen on imaging back in 2024 with benign appearance.  Follow-up as outpatient   Uncontrolled type 2 diabetes mellitus with hypoglycemia, without long-term current use of insulin  (HCC) Discontinue sliding scale since last hemoglobin A1c 6.4.  Initially on D10 drip--now d/ced   Tobacco abuse Stop smoking   Benign essential HTN On Norvasc  and metoprolol , losartan , Aldactone   Patient reports issues with urinary incontinence. I have explained to her this is something that needs to be evaluated as outpatient. I have given her phone number for Dr. Verdon OB/GYN at [facility] to call and make appointment.  I will discharge to home with home health     Procedures:none Family communication :spoke with brother [name] on 05/06/24 Consults :GI,oncology CODE STATUS: full DVT Prophylaxis :lovenox       Disposition: Home health Diet recommendation:  Cardiac and Carb modified diet DISCHARGE MEDICATION: Allergies as of 05/07/2024       Reactions   Other Itching   Nucinta - Hallucinations   Tapentadol Hcl Itching   Hallucinations   Actos  [pioglitazone ]    History of CHF   Penicillins Rash        Medication List     STOP taking these medications    potassium chloride  SA 20 MEQ tablet Commonly known as: KLOR-CON  M       TAKE these medications    acetaminophen  500 MG tablet Commonly known as: TYLENOL  Take 2 tablets by mouth every 6 (six) hours as needed.   amLODipine  10 MG tablet Commonly known as: NORVASC  Take 1 tablet (10 mg total) by mouth daily.   artificial tears ophthalmic solution Place 2 drops into the right eye as needed for dry eyes.   aspirin  81 MG tablet Take 81 mg by mouth daily.   atropine  1 % ophthalmic solution Place 1 drop into the left eye 3  (three) times daily.   brimonidine  0.2 % ophthalmic solution Commonly known as: ALPHAGAN  Place 1 drop into both eyes 2 (two) times daily.   citalopram  10 MG tablet Commonly known as: CELEXA  Take 1 tablet (10 mg total) by mouth daily.   clopidogrel  75 MG tablet Commonly known as: Plavix  Take 1 tablet (75 mg total) by mouth daily.   cyanocobalamin  1000 MCG tablet Take 1,000 mcg by mouth daily.   dapagliflozin  propanediol 10 MG Tabs tablet Commonly known as: Farxiga  Take 1 tablet (10 mg total) by mouth daily. TAKE 1 TABLET BY MOUTH EVERY DAY BEFORE BREAKFAST   diphenoxylate -atropine  2.5-0.025 MG tablet Commonly known as: LOMOTIL  Take 1 tablet by mouth 3 (three) times daily as needed for diarrhea or loose stools.   dorzolamide -timolol  2-0.5 % ophthalmic solution Commonly known as: COSOPT  Place 1 drop into both eyes 2 (two) times daily.   feeding supplement Liqd Take 237 mLs by mouth 2 (two) times daily between meals.   gabapentin  100 MG capsule Commonly known as: Neurontin  Take 1 capsule (100 mg total) by mouth 3 (three) times daily. What changed:  when to take this reasons to take this   lipase/protease/amylase 63999 UNITS Cpep capsule Commonly known as: CREON  Take 2 capsules (72,000 Units total) by mouth 3 (three) times daily before meals.   losartan  100 MG tablet Commonly known as: COZAAR  Take 1 tablet (100 mg  total) by mouth daily.   metoprolol  succinate 25 MG 24 hr tablet Commonly known as: Toprol  XL Take 0.5 tablets (12.5 mg total) by mouth daily. Start taking on: May 08, 2024 What changed:  how much to take additional instructions   multivitamin tablet Take 1 tablet by mouth daily.   nitroGLYCERIN  0.4 MG SL tablet Commonly known as: NITROSTAT  Place 1 tablet (0.4 mg total) under the tongue every 5 (five) minutes as needed for chest pain.   prednisoLONE  acetate 1 % ophthalmic suspension Commonly known as: PRED FORTE  Place 1 drop into the left eye  4 (four) times daily.   rosuvastatin  10 MG tablet Commonly known as: CRESTOR  Take 1 tablet (10 mg total) by mouth daily.   traMADol  50 MG tablet Commonly known as: ULTRAM  Take 1 tablet (50 mg total) by mouth every 6 (six) hours as needed.   Vitamin D  (Ergocalciferol ) 1.25 MG (50000 UNIT) Caps capsule Commonly known as: DRISDOL  Take 1 capsule (50,000 Units total) by mouth every 7 (seven) days.               Durable Medical Equipment  (From admission, onward)           Start     Ordered   05/06/24 1409  For home use only DME 3 n 1  Once        05/06/24 1409   05/06/24 1346  For home use only DME Bedside commode  Once       Question:  Patient needs a bedside commode to treat with the following condition  Answer:  Weakness   05/06/24 1345            Contact information for follow-up providers     Mittie Gaskin, MD Follow up in 1 week(s).   Specialty: Ophthalmology Contact information: [address]   [place] [address] [phone]         Glenard Mire, MD. Schedule an appointment as soon as possible for a visit in 1 week(s).   Specialty: Family Medicine Contact information: [address] [place] [address] [phone]         Jinny Carmine, MD. Schedule an appointment as soon as possible for a visit.   Specialty: Gastroenterology Why: chronic diarrhea Contact information: [address] [place]  [place] [address] [phone]         Rennie Cindy SAUNDERS, MD. Schedule an appointment as soon as possible for a visit in 1 week(s).   Specialties: Internal Medicine, Oncology Why: hospital f/u Contact information: [address] [place] [address] [phone]         Verdon Keen, MD. Call in 1 week(s).   Specialty: Obstetrics and Gynecology Why: for urinanry incontinence (pt or family to call and make appt) Contact information: [address] [place] [address] [phone]               Contact information for after-discharge care     Home Medical Care     [facility]) .   Service: Home Health Services Contact information: [address] [place]  [address] [phone]                    Discharge Exam: Fredricka Weights   04/30/24 1454  Weight: 57.5 kg  GENERAL:  78 y.o.-year-old patient with no acute distress.  LUNGS: Normal breath sounds bilaterally, no wheezing CARDIOVASCULAR: S1, S2 normal. No murmur   ABDOMEN: Soft,  nontender, nondistended. Bowel sounds present.  EXTREMITIES: No  edema b/l.    NEUROLOGIC: nonfocal  patient is alert and awake    The results of significant diagnostics from this hospitalization (including imaging, microbiology, ancillary and laboratory) are listed below for reference.   Imaging Studies: CT CHEST ABDOMEN PELVIS W CONTRAST Result Date: 05/04/2024 EXAM: CT CHEST, ABDOMEN AND PELVIS WITH CONTRAST 05/04/2024 05:29:03 PM TECHNIQUE: CT of the chest, abdomen and pelvis was performed with the administration of 100 mL of iohexol  (OMNIPAQUE ) 300 MG/ML solution intravenous contrast. Multiplanar reformatted images are provided for review. Automated exposure control, iterative reconstruction, and/or weight based adjustment of the mA/kV was utilized to reduce the radiation dose to as low as reasonably achievable. COMPARISON: CTA chest dated 04/08/2023. CLINICAL HISTORY: Mediastinal mass; and diarrhea - rule out carcinoid syndrome - abnormal weight loss. FINDINGS: CHEST: MEDIASTINUM AND LYMPH NODES: Mild cardiomegaly. Moderate coronary atherosclerosis of the LAD and left circumflex. Thoracic aortic atherosclerosis. Stable 3.0 cm mass with benign anterior scalloping of the T4-T5 vertebral bodies, favoring a benign foregut duplication cyst. The central airways are clear. No mediastinal, hilar or axillary lymphadenopathy. LUNGS AND PLEURA: Small bilateral pleural effusions, chronic,  with associated lower lobe atelectasis. Faint patchy ground-glass opacity in the right upper lobe (images 25 and 41), favoring post-infectious/inflammatory scarring when correlating with the prior. No pneumothorax. ABDOMEN AND PELVIS: LIVER: The liver is unremarkable. GALLBLADDER AND BILE DUCTS: Gallbladder is unremarkable. No biliary ductal dilatation. SPLEEN: No acute abnormality. PANCREAS: No acute abnormality. ADRENAL GLANDS: No acute abnormality. KIDNEYS, URETERS AND BLADDER: No stones in the kidneys or ureters. No hydronephrosis. No perinephric or periureteral stranding. Urinary bladder is unremarkable. GI AND BOWEL: Stomach demonstrates no acute abnormality. There is no bowel obstruction. REPRODUCTIVE ORGANS: Post hysterectomy. PERITONEUM AND RETROPERITONEUM: No ascites. No free air. VASCULATURE: Aorta is normal in caliber. ABDOMINAL AND PELVIS LYMPH NODES: No lymphadenopathy. BONES AND SOFT TISSUES: Mild degenerative changes of the visualized thoracolumbar spine. The T4-T5 vertebral body scalloping associated with the mediastinal mass is benign and stable. No acute osseous abnormality. No focal soft tissue abnormality. IMPRESSION: 1. 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 2. No findings suspicious for malignancy/metastatic disease. 3. Suspected postinfectious/inflammatory scarring in the right upper lobe. 4. Small bilateral pleural effusions, chronic. Electronically signed by: Pinkie Pebbles MD 05/04/2024 07:59 PM EST RP Workstation: [id number]   DG Chest Port 1 View Result Date: 05/02/2024 CLINICAL DATA:  Fever EXAM: PORTABLE CHEST 1 VIEW COMPARISON:  Chest radiograph dated 03/23/2024 FINDINGS: Normal lung volumes. Diffuse interstitial opacities with focal hazy/patchy opacities in the right upper lung. No pleural effusion or pneumothorax. Similar enlarged cardiomediastinal silhouette. No acute osseous abnormality. IMPRESSION: Diffuse interstitial opacities  with focal hazy/patchy opacities in the right upper lung, which may represent pneumonia on a background of pulmonary edema. Electronically Signed   By: Limin  Xu M.D.   On: 05/02/2024 17:28   DG Abd 1 View Result Date: 05/01/2024 EXAM: 1 VIEW XRAY OF THE ABDOMEN 05/01/2024 09:08:25 AM COMPARISON: None available. CLINICAL HISTORY: [id number] Encopresis [id number] Encopresis. FINDINGS: BOWEL: Mild diffuse gaseous distention of the bowel. Fecal material within the right colon. SOFT TISSUES: Moderate vascular calcifications. BONES: No acute fracture. HEART: The heart is enlarged. IMPRESSION: 1. Mild diffuse gaseous distention of the bowel and fecal material within the right colon, consistent with encopresis. 2. Enlarged heart. 3. Moderate vascular calcifications. Electronically signed by: Evalene Coho MD 05/01/2024 09:13 AM EST RP Workstation: [id number]    Microbiology: Results  for orders placed or performed during the hospital encounter of 04/30/24  C Difficile Quick Screen w PCR reflex     Status: None   Collection Time: 05/01/24  7:22 AM   Specimen: STOOL  Result Value Ref Range Status   C Diff antigen NEGATIVE NEGATIVE Final   C Diff toxin NEGATIVE NEGATIVE Final   C Diff interpretation No C. difficile detected.  Final    Comment: Performed at [facility], [address] [place], [place] [address]  Gastrointestinal Panel by PCR , Stool     Status: None   Collection Time: 05/01/24  7:22 AM   Specimen: Stool  Result Value Ref Range Status   Campylobacter species NOT DETECTED NOT DETECTED Final   Plesimonas shigelloides NOT DETECTED NOT DETECTED Final   Salmonella species NOT DETECTED NOT DETECTED Final   Yersinia enterocolitica NOT DETECTED NOT DETECTED Final   Vibrio species NOT DETECTED NOT DETECTED Final   Vibrio cholerae NOT DETECTED NOT DETECTED Final   Enteroaggregative E coli (EAEC) NOT DETECTED NOT DETECTED Final   Enteropathogenic E coli (EPEC) NOT DETECTED NOT DETECTED  Final   Enterotoxigenic E coli (ETEC) NOT DETECTED NOT DETECTED Final   Shiga like toxin producing E coli (STEC) NOT DETECTED NOT DETECTED Final   Shigella/Enteroinvasive E coli (EIEC) NOT DETECTED NOT DETECTED Final   Cryptosporidium NOT DETECTED NOT DETECTED Final   Cyclospora cayetanensis NOT DETECTED NOT DETECTED Final   Entamoeba histolytica NOT DETECTED NOT DETECTED Final   Giardia lamblia NOT DETECTED NOT DETECTED Final   Adenovirus F40/41 NOT DETECTED NOT DETECTED Final   Astrovirus NOT DETECTED NOT DETECTED Final   Norovirus GI/GII NOT DETECTED NOT DETECTED Final   Rotavirus A NOT DETECTED NOT DETECTED Final   Sapovirus (I, II, IV, and V) NOT DETECTED NOT DETECTED Final    Comment: Performed at [facility], [address] [place], [place] [address]  Culture, blood (Routine X 2) w Reflex to ID Panel     Status: None   Collection Time: 05/02/24  1:51 PM   Specimen: BLOOD  Result Value Ref Range Status   Specimen Description BLOOD BLOOD LEFT ARM  Final   Special Requests   Final    BOTTLES DRAWN AEROBIC AND ANAEROBIC Blood Culture results may not be optimal due to an inadequate volume of blood received in culture bottles   Culture   Final    NO GROWTH 5 DAYS Performed at [facility], [address] [place], [place] [address]    Report Status 05/07/2024 FINAL  Final  Culture, blood (Routine X 2) w Reflex to ID Panel     Status: Abnormal   Collection Time: 05/02/24  1:51 PM   Specimen: BLOOD  Result Value Ref Range Status   Specimen Description   Final    BLOOD BLOOD LEFT HAND Performed at [facility], [address], [place], [place] [address]    Special Requests   Final    BOTTLES DRAWN AEROBIC AND ANAEROBIC Blood Culture adequate volume Performed at [facility], [address] [place], [place] [address]    Culture  Setup Time   Final    GRAM POSITIVE COCCI ANAEROBIC BOTTLE ONLY CRITICAL RESULT CALLED TO, READ BACK BY AND  VERIFIED WITH:  NATHAN BELUE AT 0540 05/03/24 JG    Culture (A)  Final    STAPHYLOCOCCUS WARNERI THE SIGNIFICANCE OF ISOLATING THIS ORGANISM FROM A SINGLE SET OF BLOOD CULTURES WHEN MULTIPLE SETS ARE  DRAWN IS UNCERTAIN. PLEASE NOTIFY THE MICROBIOLOGY DEPARTMENT WITHIN ONE WEEK IF SPECIATION AND SENSITIVITIES ARE REQUIRED. Performed at [facility], [address], [place], [place] [address]    Report Status 05/05/2024 FINAL  Final  Blood Culture ID Panel (Reflexed)     Status: Abnormal   Collection Time: 05/02/24  1:51 PM  Result Value Ref Range Status   Enterococcus faecalis NOT DETECTED NOT DETECTED Final   Enterococcus Faecium NOT DETECTED NOT DETECTED Final   Listeria monocytogenes NOT DETECTED NOT DETECTED Final   Staphylococcus species DETECTED (A) NOT DETECTED Final    Comment: CRITICAL RESULT CALLED TO, READ BACK BY AND VERIFIED WITH:  NATHAN BELUE AT 0540 05/03/24 JG    Staphylococcus aureus (BCID) NOT DETECTED NOT DETECTED Final   Staphylococcus epidermidis NOT DETECTED NOT DETECTED Final   Staphylococcus lugdunensis NOT DETECTED NOT DETECTED Final   Streptococcus species NOT DETECTED NOT DETECTED Final   Streptococcus agalactiae NOT DETECTED NOT DETECTED Final   Streptococcus pneumoniae NOT DETECTED NOT DETECTED Final   Streptococcus pyogenes NOT DETECTED NOT DETECTED Final   A.calcoaceticus-baumannii NOT DETECTED NOT DETECTED Final   Bacteroides fragilis NOT DETECTED NOT DETECTED Final   Enterobacterales NOT DETECTED NOT DETECTED Final   Enterobacter cloacae complex NOT DETECTED NOT DETECTED Final   Escherichia coli NOT DETECTED NOT DETECTED Final   Klebsiella aerogenes NOT DETECTED NOT DETECTED Final   Klebsiella oxytoca NOT DETECTED NOT DETECTED Final   Klebsiella pneumoniae NOT DETECTED NOT DETECTED Final   Proteus species NOT DETECTED NOT DETECTED Final   Salmonella species NOT DETECTED NOT DETECTED Final   Serratia marcescens NOT DETECTED NOT DETECTED Final    Haemophilus influenzae NOT DETECTED NOT DETECTED Final   Neisseria meningitidis NOT DETECTED NOT DETECTED Final   Pseudomonas aeruginosa NOT DETECTED NOT DETECTED Final   Stenotrophomonas maltophilia NOT DETECTED NOT DETECTED Final   Candida albicans NOT DETECTED NOT DETECTED Final   Candida auris NOT DETECTED NOT DETECTED Final   Candida glabrata NOT DETECTED NOT DETECTED Final   Candida krusei NOT DETECTED NOT DETECTED Final   Candida parapsilosis NOT DETECTED NOT DETECTED Final   Candida tropicalis NOT DETECTED NOT DETECTED Final   Cryptococcus neoformans/gattii NOT DETECTED NOT DETECTED Final    Comment: Performed at [facility], [address] [place], [place] [address]  Culture, blood (single) w Reflex to ID Panel     Status: None (Preliminary result)   Collection Time: 05/04/24  4:27 PM   Specimen: BLOOD  Result Value Ref Range Status   Specimen Description BLOOD BLOOD LEFT ARM  Final   Special Requests   Final    BOTTLES DRAWN AEROBIC AND ANAEROBIC Blood Culture adequate volume   Culture   Final    NO GROWTH 3 DAYS Performed at [facility], [address] [place], [place] [address]    Report Status PENDING  Incomplete    Labs: CBC: Recent Labs  Lab 04/30/24 1503 05/01/24 0612 05/03/24 0339 05/06/24 0616  WBC 10.5 9.3 10.8* 6.1  NEUTROABS 9.3*  --   --   --   HGB 12.1 10.4* 11.5* 10.1*  HCT 37.7 31.0* 34.3* 31.4*  MCV 83.8 83.3 83.7 85.8  PLT 278 258 258 283   Basic Metabolic Panel: Recent Labs  Lab 05/01/24 0612 05/01/24 1614 05/02/24 0320 05/02/24 1829 05/03/24 0339 05/04/24 0445 05/05/24 0505 05/05/24 1523 05/06/24 0616 05/07/24 0521  NA 142   < > 141  --  143 139 136  --  136 137  K 2.1*   < > 2.9*   < > 2.9* 3.8 5.9* 5.1 5.1 4.6  CL 100   < > 101  --  100 102 104  --  104 103  CO2 30   < > 28  --  31 30 24   --  25 26  GLUCOSE 137*   < > 147*  --  141* 166* 148*  --  143* 170*  BUN 13   < > 11  --  9 15 12   --  10 10   CREATININE 0.73   < > 0.67  --  0.55 0.77 0.73  --  0.76 0.70  CALCIUM  6.6*   < > 6.2*  --  7.3* 7.7* 8.4*  --  8.9 8.8*  MG 0.8*  --  2.1  --  1.8 1.6* 1.9  --  1.7 1.5*  PHOS 3.3  --  2.6  --  2.6  --   --   --   --   --    < > = values in this interval not displayed.   Liver Function Tests: Recent Labs  Lab 04/30/24 1503 05/01/24 0612  AST 43* 38  ALT 20 17  ALKPHOS 91 73  BILITOT 0.7 0.6  PROT 6.2* 5.3*  ALBUMIN 3.4* 2.9*   CBG: Recent Labs  Lab 05/06/24 0733 05/06/24 1213 05/06/24 1710 05/06/24 2203 05/07/24 0819  GLUCAP 138* 154* 197* 154* 201*    Discharge time spent: greater than 30 minutes.  Signed: Leita Blanch, MD [facility] 05/07/2024

## 2024-05-09 LAB — CULTURE, BLOOD (SINGLE)
Culture: NO GROWTH
Special Requests: ADEQUATE

## 2024-05-12 LAB — VASOACTIVE INTESTINAL PEPTIDE (VIP): Vasoactive Intest Polypeptide: 34.5 pg/mL (ref 0.0–58.8)

## 2024-05-16 ENCOUNTER — Emergency Department

## 2024-05-16 ENCOUNTER — Inpatient Hospital Stay
Admission: EM | Admit: 2024-05-16 | Discharge: 2024-05-24 | DRG: 522 | Disposition: A | Discharge status: Skilled Nursing Facility | Attending: Internal Medicine | Admitting: Internal Medicine

## 2024-05-16 ENCOUNTER — Other Ambulatory Visit: Payer: Self-pay

## 2024-05-16 DIAGNOSIS — Z59868 Other specified financial insecurity: Secondary | ICD-10-CM

## 2024-05-16 DIAGNOSIS — I5032 Chronic diastolic (congestive) heart failure: Secondary | ICD-10-CM | POA: Diagnosis present

## 2024-05-16 DIAGNOSIS — S72002A Fracture of unspecified part of neck of left femur, initial encounter for closed fracture: Principal | ICD-10-CM | POA: Diagnosis present

## 2024-05-16 DIAGNOSIS — D72829 Elevated white blood cell count, unspecified: Secondary | ICD-10-CM | POA: Diagnosis present

## 2024-05-16 DIAGNOSIS — Z86718 Personal history of other venous thrombosis and embolism: Secondary | ICD-10-CM

## 2024-05-16 DIAGNOSIS — Z79899 Other long term (current) drug therapy: Secondary | ICD-10-CM

## 2024-05-16 DIAGNOSIS — F17210 Nicotine dependence, cigarettes, uncomplicated: Secondary | ICD-10-CM | POA: Diagnosis present

## 2024-05-16 DIAGNOSIS — I110 Hypertensive heart disease with heart failure: Secondary | ICD-10-CM | POA: Diagnosis present

## 2024-05-16 DIAGNOSIS — I5189 Other ill-defined heart diseases: Secondary | ICD-10-CM

## 2024-05-16 DIAGNOSIS — Z5989 Other problems related to housing and economic circumstances: Secondary | ICD-10-CM

## 2024-05-16 DIAGNOSIS — Z7982 Long term (current) use of aspirin: Secondary | ICD-10-CM

## 2024-05-16 DIAGNOSIS — R32 Unspecified urinary incontinence: Secondary | ICD-10-CM | POA: Diagnosis present

## 2024-05-16 DIAGNOSIS — Z880 Allergy status to penicillin: Secondary | ICD-10-CM

## 2024-05-16 DIAGNOSIS — Z833 Family history of diabetes mellitus: Secondary | ICD-10-CM

## 2024-05-16 DIAGNOSIS — E119 Type 2 diabetes mellitus without complications: Secondary | ICD-10-CM

## 2024-05-16 DIAGNOSIS — Z5982 Transportation insecurity: Secondary | ICD-10-CM

## 2024-05-16 DIAGNOSIS — Z5320 Procedure and treatment not carried out because of patient's decision for unspecified reasons: Secondary | ICD-10-CM | POA: Diagnosis not present

## 2024-05-16 DIAGNOSIS — Z59819 Housing instability, housed unspecified: Secondary | ICD-10-CM

## 2024-05-16 DIAGNOSIS — Z9842 Cataract extraction status, left eye: Secondary | ICD-10-CM

## 2024-05-16 DIAGNOSIS — D62 Acute posthemorrhagic anemia: Secondary | ICD-10-CM | POA: Diagnosis not present

## 2024-05-16 DIAGNOSIS — E876 Hypokalemia: Secondary | ICD-10-CM | POA: Diagnosis present

## 2024-05-16 DIAGNOSIS — H409 Unspecified glaucoma: Secondary | ICD-10-CM | POA: Diagnosis present

## 2024-05-16 DIAGNOSIS — Z751 Person awaiting admission to adequate facility elsewhere: Secondary | ICD-10-CM

## 2024-05-16 DIAGNOSIS — Y92002 Bathroom of unspecified non-institutional (private) residence single-family (private) house as the place of occurrence of the external cause: Secondary | ICD-10-CM

## 2024-05-16 DIAGNOSIS — Z961 Presence of intraocular lens: Secondary | ICD-10-CM | POA: Diagnosis present

## 2024-05-16 DIAGNOSIS — E1165 Type 2 diabetes mellitus with hyperglycemia: Secondary | ICD-10-CM | POA: Diagnosis present

## 2024-05-16 DIAGNOSIS — W010XXA Fall on same level from slipping, tripping and stumbling without subsequent striking against object, initial encounter: Secondary | ICD-10-CM | POA: Diagnosis present

## 2024-05-16 DIAGNOSIS — K591 Functional diarrhea: Secondary | ICD-10-CM | POA: Diagnosis present

## 2024-05-16 DIAGNOSIS — Z8249 Family history of ischemic heart disease and other diseases of the circulatory system: Secondary | ICD-10-CM

## 2024-05-16 DIAGNOSIS — T475X5A Adverse effect of digestants, initial encounter: Secondary | ICD-10-CM | POA: Diagnosis not present

## 2024-05-16 DIAGNOSIS — H5461 Unqualified visual loss, right eye, normal vision left eye: Secondary | ICD-10-CM | POA: Diagnosis present

## 2024-05-16 DIAGNOSIS — E785 Hyperlipidemia, unspecified: Secondary | ICD-10-CM | POA: Diagnosis present

## 2024-05-16 DIAGNOSIS — E1142 Type 2 diabetes mellitus with diabetic polyneuropathy: Secondary | ICD-10-CM | POA: Diagnosis present

## 2024-05-16 DIAGNOSIS — F32A Depression, unspecified: Secondary | ICD-10-CM | POA: Diagnosis present

## 2024-05-16 DIAGNOSIS — R159 Full incontinence of feces: Secondary | ICD-10-CM | POA: Diagnosis present

## 2024-05-16 DIAGNOSIS — F0394 Unspecified dementia, unspecified severity, with anxiety: Secondary | ICD-10-CM | POA: Diagnosis present

## 2024-05-16 DIAGNOSIS — Z90710 Acquired absence of both cervix and uterus: Secondary | ICD-10-CM

## 2024-05-16 DIAGNOSIS — F0393 Unspecified dementia, unspecified severity, with mood disturbance: Secondary | ICD-10-CM | POA: Diagnosis present

## 2024-05-16 DIAGNOSIS — R509 Fever, unspecified: Secondary | ICD-10-CM | POA: Diagnosis not present

## 2024-05-16 DIAGNOSIS — K30 Functional dyspepsia: Secondary | ICD-10-CM | POA: Diagnosis not present

## 2024-05-16 DIAGNOSIS — Z7902 Long term (current) use of antithrombotics/antiplatelets: Secondary | ICD-10-CM

## 2024-05-16 MED ORDER — FENTANYL CITRATE (PF) 50 MCG/ML IJ SOSY
50.0000 ug | PREFILLED_SYRINGE | Freq: Once | INTRAMUSCULAR | Status: AC
  Administered 2024-05-16: 50 ug via INTRAVENOUS
  Filled 2024-05-16: qty 1

## 2024-05-16 MED ORDER — FENTANYL CITRATE (PF) 50 MCG/ML IJ SOSY
50.0000 ug | PREFILLED_SYRINGE | INTRAMUSCULAR | Status: DC | PRN
  Administered 2024-05-16 – 2024-05-20 (×3): 50 ug via INTRAVENOUS
  Filled 2024-05-16 (×4): qty 1

## 2024-05-16 MED ORDER — SODIUM CHLORIDE 0.9 % IV SOLN: INTRAVENOUS | Status: AC

## 2024-05-16 MED ORDER — ONDANSETRON HCL 4 MG/2ML IJ SOLN
4.0000 mg | Freq: Three times a day (TID) | INTRAMUSCULAR | Status: DC | PRN
  Filled 2024-05-16: qty 2

## 2024-05-16 NOTE — ED Provider Notes (Signed)
 "  [facility] Provider Note    Event Date/Time   First MD Initiated Contact with Patient 05/16/24 2100     (approximate)   History   Fall   HPI  Lisa [name] is a 78 y.o. female who presents to the emergency department today with concerns for left hip pain after a fall.  The patient states that she was in her bathroom when she slipped.  She fell on her left hip onto the hard tile.  She also hit her head although she is not complaining of any headache.  She states all of her pain is in her left hip.  She was not able to get up and ambulate.  The pain is severe and worse with any movement.  Patient denies any recent illness.  Denies any chest pain or palpitations at the time of the fall.     Physical Exam   Triage Vital Signs: ED Triage Vitals  Encounter Vitals Group     BP 05/16/24 2014 (!) 164/66     Girls Systolic BP Percentile --      Girls Diastolic BP Percentile --      Boys Systolic BP Percentile --      Boys Diastolic BP Percentile --      Pulse Rate 05/16/24 2014 (!) 106     Resp 05/16/24 2014 19     Temp 05/16/24 2014 97.6 F (36.4 C)     Temp src --      SpO2 05/16/24 2014 100 %     Weight 05/16/24 2012 120 lb (54.4 kg)     Height 05/16/24 2012 5' 4 (1.626 m)     Head Circumference --      Peak Flow --      Pain Score 05/16/24 2012 10     Pain Loc --      Pain Education --      Exclude from Growth Chart --     Most recent vital signs: Vitals:   05/16/24 2014  BP: (!) 164/66  Pulse: (!) 106  Resp: 19  Temp: 97.6 F (36.4 C)  SpO2: 100%   General: Awake, alert, oriented. CV:  Good peripheral perfusion. Regular rate and rhythm. Resp:  Normal effort.  Abd:  No distention.  Other:  Left hip tender to palpation.    ED Results / Procedures / Treatments   Labs (all labs ordered are listed, but only abnormal results are displayed) Labs Reviewed - No data to display   RADIOLOGY I independently interpreted and  visualized the CT head. My interpretation: No ICH Radiology interpretation:  IMPRESSION:  1. No acute intracranial abnormality. No skull fracture.  2. Stable atrophy and chronic small vessel ischemia.    I independently interpreted and visualized the CT cervical spine. My interpretation: No fracture Radiology interpretation:  IMPRESSION:  Degenerative change in the cervical spine without acute fracture or  subluxation.     I independently interpreted and visualized the left hip. My interpretation: left hip fracture Radiology interpretation:  IMPRESSION:  Displaced left femoral neck fracture.      PROCEDURES:  Critical Care performed: No    MEDICATIONS ORDERED IN ED: Medications  fentaNYL  (SUBLIMAZE ) injection 50 mcg (50 mcg Intravenous Given 05/16/24 2202)     IMPRESSION / MDM / ASSESSMENT AND PLAN / ED COURSE  I reviewed the triage vital signs and the nursing notes.  Differential diagnosis includes, but is not limited to, fracture, dislocation, contusion  Patient's presentation is most consistent with acute presentation with potential threat to life or bodily function.   Presented to the emergency department today because of concerns for left hip pain after a fall.  X-rays consistent with a femoral neck fracture.  I discussed this finding with the patient.  Discussed with Dr. Ezra with orthopedic surgery who will plan on taking her to the OR tomorrow.  Will discuss with the hospitalist service for admission.     FINAL CLINICAL IMPRESSION(S) / ED DIAGNOSES   Final diagnoses:  Left displaced femoral neck fracture (HCC)     Note:  This document was prepared using Dragon voice recognition software and may include unintentional dictation errors.    Floy Roberts, MD 05/17/24 1549  "

## 2024-05-16 NOTE — ED Notes (Signed)
 This RN and NT changed pt into gown. Pt requested us  to cut jeans so they wouldn't have to be pulled off.

## 2024-05-16 NOTE — ED Triage Notes (Signed)
 Pt to ED via EMS from home, pt reports she was walking to the bathroom lost her footing and fell on left hip. Pt has left toes amputated. Pt reports hitting her head, unsure if she takes a blood thinner but plavix  is listed in her meds in chart. C/o pain to left hip/ upper leg.

## 2024-05-16 NOTE — ED Triage Notes (Signed)
 Pt arrives via EMS from home for mechanical fall at 6pm; c/o left hip/femur pain; left foot amputee; tylenol  taken PTA

## 2024-05-17 ENCOUNTER — Inpatient Hospital Stay

## 2024-05-17 ENCOUNTER — Inpatient Hospital Stay: Admitting: Anesthesiology

## 2024-05-17 ENCOUNTER — Encounter
Admission: EM | Disposition: A | Discharge status: Skilled Nursing Facility | Payer: Self-pay | Source: Home / Self Care | Attending: Internal Medicine

## 2024-05-17 DIAGNOSIS — S72002A Fracture of unspecified part of neck of left femur, initial encounter for closed fracture: Secondary | ICD-10-CM | POA: Diagnosis present

## 2024-05-17 DIAGNOSIS — F32A Depression, unspecified: Secondary | ICD-10-CM | POA: Diagnosis present

## 2024-05-17 DIAGNOSIS — I5189 Other ill-defined heart diseases: Secondary | ICD-10-CM | POA: Diagnosis not present

## 2024-05-17 DIAGNOSIS — K30 Functional dyspepsia: Secondary | ICD-10-CM | POA: Diagnosis not present

## 2024-05-17 DIAGNOSIS — Z7902 Long term (current) use of antithrombotics/antiplatelets: Secondary | ICD-10-CM | POA: Diagnosis not present

## 2024-05-17 DIAGNOSIS — F0394 Unspecified dementia, unspecified severity, with anxiety: Secondary | ICD-10-CM | POA: Diagnosis present

## 2024-05-17 DIAGNOSIS — Z880 Allergy status to penicillin: Secondary | ICD-10-CM | POA: Diagnosis not present

## 2024-05-17 DIAGNOSIS — Z86718 Personal history of other venous thrombosis and embolism: Secondary | ICD-10-CM | POA: Diagnosis not present

## 2024-05-17 DIAGNOSIS — M25552 Pain in left hip: Secondary | ICD-10-CM | POA: Diagnosis present

## 2024-05-17 DIAGNOSIS — F0393 Unspecified dementia, unspecified severity, with mood disturbance: Secondary | ICD-10-CM | POA: Diagnosis present

## 2024-05-17 DIAGNOSIS — E785 Hyperlipidemia, unspecified: Secondary | ICD-10-CM | POA: Diagnosis present

## 2024-05-17 DIAGNOSIS — W010XXA Fall on same level from slipping, tripping and stumbling without subsequent striking against object, initial encounter: Secondary | ICD-10-CM | POA: Diagnosis present

## 2024-05-17 DIAGNOSIS — Z751 Person awaiting admission to adequate facility elsewhere: Secondary | ICD-10-CM | POA: Diagnosis not present

## 2024-05-17 DIAGNOSIS — I5032 Chronic diastolic (congestive) heart failure: Secondary | ICD-10-CM | POA: Diagnosis present

## 2024-05-17 DIAGNOSIS — E876 Hypokalemia: Secondary | ICD-10-CM | POA: Diagnosis present

## 2024-05-17 DIAGNOSIS — F17210 Nicotine dependence, cigarettes, uncomplicated: Secondary | ICD-10-CM | POA: Diagnosis present

## 2024-05-17 DIAGNOSIS — D62 Acute posthemorrhagic anemia: Secondary | ICD-10-CM | POA: Diagnosis not present

## 2024-05-17 DIAGNOSIS — Z59819 Housing instability, housed unspecified: Secondary | ICD-10-CM | POA: Diagnosis not present

## 2024-05-17 DIAGNOSIS — E1142 Type 2 diabetes mellitus with diabetic polyneuropathy: Secondary | ICD-10-CM | POA: Diagnosis present

## 2024-05-17 DIAGNOSIS — Y92002 Bathroom of unspecified non-institutional (private) residence single-family (private) house as the place of occurrence of the external cause: Secondary | ICD-10-CM | POA: Diagnosis not present

## 2024-05-17 DIAGNOSIS — Z833 Family history of diabetes mellitus: Secondary | ICD-10-CM | POA: Diagnosis not present

## 2024-05-17 DIAGNOSIS — K591 Functional diarrhea: Secondary | ICD-10-CM | POA: Diagnosis present

## 2024-05-17 DIAGNOSIS — I110 Hypertensive heart disease with heart failure: Secondary | ICD-10-CM | POA: Diagnosis present

## 2024-05-17 DIAGNOSIS — D72829 Elevated white blood cell count, unspecified: Secondary | ICD-10-CM | POA: Diagnosis present

## 2024-05-17 DIAGNOSIS — R509 Fever, unspecified: Secondary | ICD-10-CM | POA: Diagnosis not present

## 2024-05-17 DIAGNOSIS — Z7982 Long term (current) use of aspirin: Secondary | ICD-10-CM | POA: Diagnosis not present

## 2024-05-17 DIAGNOSIS — E119 Type 2 diabetes mellitus without complications: Secondary | ICD-10-CM | POA: Diagnosis not present

## 2024-05-17 DIAGNOSIS — E1165 Type 2 diabetes mellitus with hyperglycemia: Secondary | ICD-10-CM | POA: Diagnosis present

## 2024-05-17 DIAGNOSIS — Z8249 Family history of ischemic heart disease and other diseases of the circulatory system: Secondary | ICD-10-CM | POA: Diagnosis not present

## 2024-05-17 HISTORY — PX: HIP ARTHROPLASTY: SHX981

## 2024-05-17 LAB — BASIC METABOLIC PANEL WITH GFR
Anion gap: 12 (ref 5–15)
Anion gap: 13 (ref 5–15)
BUN: 11 mg/dL (ref 8–23)
BUN: 9 mg/dL (ref 8–23)
CO2: 21 mmol/L — ABNORMAL LOW (ref 22–32)
CO2: 22 mmol/L (ref 22–32)
Calcium: 8.6 mg/dL — ABNORMAL LOW (ref 8.9–10.3)
Calcium: 8.6 mg/dL — ABNORMAL LOW (ref 8.9–10.3)
Chloride: 104 mmol/L (ref 98–111)
Chloride: 105 mmol/L (ref 98–111)
Creatinine, Ser: 0.6 mg/dL (ref 0.44–1.00)
Creatinine, Ser: 0.64 mg/dL (ref 0.44–1.00)
GFR, Estimated: >60 mL/min
GFR, Estimated: >60 mL/min
Glucose, Bld: 119 mg/dL — ABNORMAL HIGH (ref 70–99)
Glucose, Bld: 149 mg/dL — ABNORMAL HIGH (ref 70–99)
Potassium: 3.1 mmol/L — ABNORMAL LOW (ref 3.5–5.1)
Potassium: 3.1 mmol/L — ABNORMAL LOW (ref 3.5–5.1)
Sodium: 138 mmol/L (ref 135–145)
Sodium: 138 mmol/L (ref 135–145)

## 2024-05-17 LAB — CBC WITH DIFFERENTIAL/PLATELET
Abs Immature Granulocytes: 0.03 K/uL (ref 0.00–0.07)
Abs Immature Granulocytes: 0.05 K/uL (ref 0.00–0.07)
Basophils Absolute: 0 K/uL (ref 0.0–0.1)
Basophils Absolute: 0 K/uL (ref 0.0–0.1)
Basophils Relative: 0 %
Basophils Relative: 0 %
Eosinophils Absolute: 0 K/uL (ref 0.0–0.5)
Eosinophils Absolute: 0 K/uL (ref 0.0–0.5)
Eosinophils Relative: 0 %
Eosinophils Relative: 0 %
HCT: 30.7 % — ABNORMAL LOW (ref 36.0–46.0)
HCT: 31.7 % — ABNORMAL LOW (ref 36.0–46.0)
Hemoglobin: 10.3 g/dL — ABNORMAL LOW (ref 12.0–15.0)
Hemoglobin: 9.8 g/dL — ABNORMAL LOW (ref 12.0–15.0)
Immature Granulocytes: 0 %
Immature Granulocytes: 0 %
Lymphocytes Relative: 5 %
Lymphocytes Relative: 6 %
Lymphs Abs: 0.6 K/uL — ABNORMAL LOW (ref 0.7–4.0)
Lymphs Abs: 0.6 K/uL — ABNORMAL LOW (ref 0.7–4.0)
MCH: 28.1 pg (ref 26.0–34.0)
MCH: 28.1 pg (ref 26.0–34.0)
MCHC: 31.9 g/dL (ref 30.0–36.0)
MCHC: 32.5 g/dL (ref 30.0–36.0)
MCV: 86.6 fL (ref 80.0–100.0)
MCV: 88 fL (ref 80.0–100.0)
Monocytes Absolute: 0.5 K/uL (ref 0.1–1.0)
Monocytes Absolute: 0.6 K/uL (ref 0.1–1.0)
Monocytes Relative: 4 %
Monocytes Relative: 5 %
Neutro Abs: 10.9 K/uL — ABNORMAL HIGH (ref 1.7–7.7)
Neutro Abs: 9.4 K/uL — ABNORMAL HIGH (ref 1.7–7.7)
Neutrophils Relative %: 90 %
Neutrophils Relative %: 90 %
Platelets: 262 K/uL (ref 150–400)
Platelets: 305 K/uL (ref 150–400)
RBC: 3.49 MIL/uL — ABNORMAL LOW (ref 3.87–5.11)
RBC: 3.66 MIL/uL — ABNORMAL LOW (ref 3.87–5.11)
RDW: 20.3 % — ABNORMAL HIGH (ref 11.5–15.5)
RDW: 20.4 % — ABNORMAL HIGH (ref 11.5–15.5)
WBC: 10.5 K/uL (ref 4.0–10.5)
WBC: 12.1 K/uL — ABNORMAL HIGH (ref 4.0–10.5)
nRBC: 0 % (ref 0.0–0.2)
nRBC: 0 % (ref 0.0–0.2)

## 2024-05-17 LAB — TYPE AND SCREEN
ABO/RH(D): AB POS
Antibody Screen: NEGATIVE

## 2024-05-17 LAB — GLUCOSE, CAPILLARY
Glucose-Capillary: 100 mg/dL — ABNORMAL HIGH (ref 70–99)
Glucose-Capillary: 117 mg/dL — ABNORMAL HIGH (ref 70–99)
Glucose-Capillary: 118 mg/dL — ABNORMAL HIGH (ref 70–99)
Glucose-Capillary: 147 mg/dL — ABNORMAL HIGH (ref 70–99)
Glucose-Capillary: 163 mg/dL — ABNORMAL HIGH (ref 70–99)
Glucose-Capillary: 208 mg/dL — ABNORMAL HIGH (ref 70–99)
Glucose-Capillary: 306 mg/dL — ABNORMAL HIGH (ref 70–99)
Glucose-Capillary: 67 mg/dL — ABNORMAL LOW (ref 70–99)
Glucose-Capillary: 87 mg/dL (ref 70–99)

## 2024-05-17 LAB — SURGICAL PCR SCREEN
MRSA, PCR: NEGATIVE
Staphylococcus aureus: NEGATIVE

## 2024-05-17 LAB — PHOSPHORUS: Phosphorus: 3.2 mg/dL (ref 2.5–4.6)

## 2024-05-17 LAB — MAGNESIUM: Magnesium: 1.7 mg/dL (ref 1.7–2.4)

## 2024-05-17 SURGERY — HEMIARTHROPLASTY (BIPOLAR) HIP, POSTERIOR APPROACH FOR FRACTURE
Anesthesia: General | Laterality: Left

## 2024-05-17 MED ORDER — POLYETHYLENE GLYCOL 3350 17 G PO PACK: 17.0000 g | PACK | Freq: Every day | ORAL | Status: DC | PRN

## 2024-05-17 MED ORDER — EPHEDRINE 5 MG/ML INJ
INTRAVENOUS | Status: AC
  Filled 2024-05-17: qty 5

## 2024-05-17 MED ORDER — METOPROLOL SUCCINATE ER 25 MG PO TB24
12.5000 mg | ORAL_TABLET | Freq: Every day | ORAL | Status: DC
  Administered 2024-05-17 – 2024-05-24 (×7): 12.5 mg via ORAL
  Filled 2024-05-17 (×6): qty 1

## 2024-05-17 MED ORDER — GLYCOPYRROLATE 0.2 MG/ML IJ SOLN
INTRAMUSCULAR | Status: AC
  Filled 2024-05-17: qty 5

## 2024-05-17 MED ORDER — POTASSIUM CHLORIDE CRYS ER 20 MEQ PO TBCR
20.0000 meq | EXTENDED_RELEASE_TABLET | Freq: Once | ORAL | Status: AC
  Administered 2024-05-17: 20 meq via ORAL
  Filled 2024-05-17: qty 1

## 2024-05-17 MED ORDER — DEXTROSE 50 % IV SOLN
INTRAVENOUS | Status: AC
  Filled 2024-05-17: qty 50

## 2024-05-17 MED ORDER — LACTATED RINGERS IV SOLN: INTRAVENOUS | Status: DC | PRN

## 2024-05-17 MED ORDER — ROCURONIUM BROMIDE 10 MG/ML (PF) SYRINGE
PREFILLED_SYRINGE | INTRAVENOUS | Status: AC
  Filled 2024-05-17: qty 10

## 2024-05-17 MED ORDER — ROCURONIUM BROMIDE 100 MG/10ML IV SOLN
INTRAVENOUS | Status: DC | PRN
  Administered 2024-05-17: 10 mg via INTRAVENOUS
  Administered 2024-05-17: 40 mg via INTRAVENOUS

## 2024-05-17 MED ORDER — CEFAZOLIN SODIUM-DEXTROSE 2-4 GM/100ML-% IV SOLN
INTRAVENOUS | Status: AC
  Filled 2024-05-17: qty 100

## 2024-05-17 MED ORDER — POTASSIUM CHLORIDE CRYS ER 20 MEQ PO TBCR
40.0000 meq | EXTENDED_RELEASE_TABLET | Freq: Once | ORAL | Status: AC
  Administered 2024-05-17: 40 meq via ORAL
  Filled 2024-05-17: qty 2

## 2024-05-17 MED ORDER — FENTANYL CITRATE (PF) 100 MCG/2ML IJ SOLN: 25.0000 ug | INTRAMUSCULAR | Status: DC | PRN

## 2024-05-17 MED ORDER — OXYCODONE HCL 5 MG PO TABS: 5.0000 mg | ORAL_TABLET | Freq: Once | ORAL | Status: DC | PRN

## 2024-05-17 MED ORDER — OXYCODONE HCL 5 MG PO TABS
5.0000 mg | ORAL_TABLET | ORAL | Status: AC | PRN
  Administered 2024-05-17: 5 mg via ORAL
  Administered 2024-05-18: 10 mg via ORAL
  Administered 2024-05-20 – 2024-05-23 (×4): 5 mg via ORAL
  Filled 2024-05-17 (×2): qty 2
  Filled 2024-05-17: qty 1
  Filled 2024-05-17: qty 2
  Filled 2024-05-17 (×2): qty 1

## 2024-05-17 MED ORDER — LIDOCAINE HCL (CARDIAC) PF 100 MG/5ML IV SOSY
PREFILLED_SYRINGE | INTRAVENOUS | Status: DC | PRN
  Administered 2024-05-17: 60 mg via INTRAVENOUS

## 2024-05-17 MED ORDER — KETOROLAC TROMETHAMINE 30 MG/ML IJ SOLN
INTRAMUSCULAR | Status: AC
  Filled 2024-05-17: qty 1

## 2024-05-17 MED ORDER — TRANEXAMIC ACID-NACL 1000-0.7 MG/100ML-% IV SOLN
INTRAVENOUS | Status: AC
  Filled 2024-05-17: qty 100

## 2024-05-17 MED ORDER — HYDRALAZINE HCL 25 MG PO TABS
25.0000 mg | ORAL_TABLET | Freq: Three times a day (TID) | ORAL | Status: DC | PRN
  Filled 2024-05-17: qty 1

## 2024-05-17 MED ORDER — SUGAMMADEX SODIUM 200 MG/2ML IV SOLN
INTRAVENOUS | Status: DC | PRN
  Administered 2024-05-17: 200 mg via INTRAVENOUS

## 2024-05-17 MED ORDER — POTASSIUM CHLORIDE 10 MEQ/100ML IV SOLN
10.0000 meq | INTRAVENOUS | Status: AC
  Filled 2024-05-17: qty 100

## 2024-05-17 MED ORDER — DEXTROSE 50 % IV SOLN
12.5000 g | Freq: Once | INTRAVENOUS | Status: AC
  Administered 2024-05-17: 12.5 g via INTRAVENOUS

## 2024-05-17 MED ORDER — INSULIN ASPART 100 UNIT/ML IJ SOLN
0.0000 [IU] | INTRAMUSCULAR | Status: DC
  Administered 2024-05-18: 3 [IU] via SUBCUTANEOUS
  Filled 2024-05-17 (×2): qty 3

## 2024-05-17 MED ORDER — PROPOFOL 10 MG/ML IV BOLUS
INTRAVENOUS | Status: DC | PRN
  Administered 2024-05-17: 30 mg via INTRAVENOUS
  Administered 2024-05-17: 50 mg via INTRAVENOUS

## 2024-05-17 MED ORDER — KETOROLAC TROMETHAMINE 30 MG/ML IJ SOLN
INTRAMUSCULAR | Status: DC | PRN
  Administered 2024-05-17: 15 mg via INTRAVENOUS

## 2024-05-17 MED ORDER — MAGNESIUM SULFATE 2 GM/50ML IV SOLN
2.0000 g | Freq: Once | INTRAVENOUS | Status: AC
  Administered 2024-05-17: 2 g via INTRAVENOUS
  Filled 2024-05-17: qty 50

## 2024-05-17 MED ORDER — FENTANYL CITRATE (PF) 100 MCG/2ML IJ SOLN
INTRAMUSCULAR | Status: AC
  Filled 2024-05-17: qty 2

## 2024-05-17 MED ORDER — BUPIVACAINE HCL (PF) 0.5 % IJ SOLN
INTRAMUSCULAR | Status: AC
  Filled 2024-05-17: qty 30

## 2024-05-17 MED ORDER — AMLODIPINE BESYLATE 10 MG PO TABS
10.0000 mg | ORAL_TABLET | Freq: Every day | ORAL | Status: DC
  Administered 2024-05-17 – 2024-05-24 (×8): 10 mg via ORAL
  Filled 2024-05-17 (×6): qty 1

## 2024-05-17 MED ORDER — ACETAMINOPHEN 10 MG/ML IV SOLN: 1000.0000 mg | Freq: Once | INTRAVENOUS | Status: DC | PRN

## 2024-05-17 MED ORDER — FENTANYL CITRATE (PF) 100 MCG/2ML IJ SOLN
INTRAMUSCULAR | Status: DC | PRN
  Administered 2024-05-17: 50 ug via INTRAVENOUS

## 2024-05-17 MED ORDER — HYDROMORPHONE HCL 1 MG/ML IJ SOLN
0.5000 mg | INTRAMUSCULAR | Status: AC
  Administered 2024-05-17: 0.5 mg via INTRAVENOUS
  Filled 2024-05-17: qty 0.5

## 2024-05-17 MED ORDER — EPHEDRINE SULFATE-NACL 50-0.9 MG/10ML-% IV SOSY
PREFILLED_SYRINGE | INTRAVENOUS | Status: DC | PRN
  Administered 2024-05-17: 5 mg via INTRAVENOUS

## 2024-05-17 MED ORDER — DEXAMETHASONE SOD PHOSPHATE PF 10 MG/ML IJ SOLN
INTRAMUSCULAR | Status: DC | PRN
  Administered 2024-05-17: 10 mg via INTRAVENOUS

## 2024-05-17 MED ORDER — BUPIVACAINE LIPOSOME 1.3 % IJ SUSP
INTRAMUSCULAR | Status: DC | PRN
  Administered 2024-05-17: 20 mL via INTRAMUSCULAR

## 2024-05-17 MED ORDER — CITALOPRAM HYDROBROMIDE 20 MG PO TABS
10.0000 mg | ORAL_TABLET | Freq: Every day | ORAL | Status: DC
  Administered 2024-05-18 – 2024-05-24 (×7): 10 mg via ORAL
  Filled 2024-05-17 (×5): qty 1

## 2024-05-17 MED ORDER — TRANEXAMIC ACID-NACL 1000-0.7 MG/100ML-% IV SOLN
INTRAVENOUS | Status: DC | PRN
  Administered 2024-05-17 (×2): 1000 mg via INTRAVENOUS

## 2024-05-17 MED ORDER — CEFAZOLIN SODIUM-DEXTROSE 2-4 GM/100ML-% IV SOLN
2.0000 g | Freq: Three times a day (TID) | INTRAVENOUS | Status: AC
  Administered 2024-05-17 (×3): 2 g via INTRAVENOUS
  Filled 2024-05-17 (×2): qty 100

## 2024-05-17 MED ORDER — POTASSIUM CHLORIDE 10 MEQ/100ML IV SOLN: 10.0000 meq | INTRAVENOUS | Status: DC

## 2024-05-17 MED ORDER — BUPIVACAINE LIPOSOME 1.3 % IJ SUSP
INTRAMUSCULAR | Status: AC
  Filled 2024-05-17: qty 20

## 2024-05-17 MED ORDER — POTASSIUM CHLORIDE 10 MEQ/100ML IV SOLN
10.0000 meq | INTRAVENOUS | Status: AC
  Administered 2024-05-17 (×3): 10 meq via INTRAVENOUS
  Filled 2024-05-17 (×3): qty 100

## 2024-05-17 MED ORDER — OXYCODONE HCL 5 MG/5ML PO SOLN: 5.0000 mg | Freq: Once | ORAL | Status: DC | PRN

## 2024-05-17 MED ORDER — 0.9 % SODIUM CHLORIDE (POUR BTL) OPTIME
TOPICAL | Status: DC | PRN
  Administered 2024-05-17: 1000 mL

## 2024-05-17 MED ORDER — PHENYLEPHRINE 80 MCG/ML (10ML) SYRINGE FOR IV PUSH (FOR BLOOD PRESSURE SUPPORT)
PREFILLED_SYRINGE | INTRAVENOUS | Status: AC
  Filled 2024-05-17: qty 10

## 2024-05-17 MED ORDER — MAGNESIUM SULFATE 2 GM/50ML IV SOLN: 2.0000 g | Freq: Once | INTRAVENOUS | Status: DC

## 2024-05-17 MED ORDER — ACETAMINOPHEN 500 MG PO TABS
500.0000 mg | ORAL_TABLET | Freq: Four times a day (QID) | ORAL | Status: AC | PRN
  Administered 2024-05-19 – 2024-05-24 (×4): 500 mg via ORAL
  Filled 2024-05-17 (×3): qty 1

## 2024-05-17 MED ORDER — ACETAMINOPHEN 10 MG/ML IV SOLN
INTRAVENOUS | Status: DC | PRN
  Administered 2024-05-17: 1000 mg via INTRAVENOUS

## 2024-05-17 MED ORDER — ROSUVASTATIN CALCIUM 10 MG PO TABS
10.0000 mg | ORAL_TABLET | Freq: Every day | ORAL | Status: DC
  Administered 2024-05-18 – 2024-05-24 (×7): 10 mg via ORAL
  Filled 2024-05-17 (×5): qty 1

## 2024-05-17 MED ORDER — DEXMEDETOMIDINE HCL IN NACL 80 MCG/20ML IV SOLN
INTRAVENOUS | Status: AC
  Filled 2024-05-17: qty 20

## 2024-05-17 MED ORDER — LIDOCAINE HCL (PF) 2 % IJ SOLN
INTRAMUSCULAR | Status: AC
  Filled 2024-05-17: qty 5

## 2024-05-17 MED ORDER — ONDANSETRON HCL 4 MG/2ML IJ SOLN
INTRAMUSCULAR | Status: DC | PRN
  Administered 2024-05-17: 4 mg via INTRAVENOUS

## 2024-05-17 MED ORDER — DEXTROSE IN LACTATED RINGERS 5 % IV SOLN: INTRAVENOUS | Status: DC | PRN

## 2024-05-17 MED ORDER — TRANEXAMIC ACID-NACL 1000-0.7 MG/100ML-% IV SOLN: 1000.0000 mg | INTRAVENOUS | Status: AC

## 2024-05-17 MED ORDER — ACETAMINOPHEN 10 MG/ML IV SOLN
INTRAVENOUS | Status: AC
  Filled 2024-05-17: qty 100

## 2024-05-17 MED ORDER — MELATONIN 5 MG PO TABS
5.0000 mg | ORAL_TABLET | Freq: Every evening | ORAL | Status: DC | PRN
  Administered 2024-05-21 – 2024-05-23 (×3): 5 mg via ORAL
  Filled 2024-05-17 (×2): qty 1

## 2024-05-17 SURGICAL SUPPLY — 54 items
BLADE SAGITTAL WIDE XTHICK NO (BLADE) ×1 IMPLANT
BLADE SAW 70X12.5 (BLADE) ×1 IMPLANT
CEMENT BONE 40GM (Cement) IMPLANT
CEMENT VACUUM MIXING SYSTEM (MISCELLANEOUS) ×1 IMPLANT
CENTRALIZER STM 11XPSTNR STRL (Orthopedic Implant) IMPLANT
CHLORAPREP W/TINT 26 (MISCELLANEOUS) ×2 IMPLANT
DRAPE HIP W/POCKET STRL (MISCELLANEOUS) ×1 IMPLANT
DRAPE INCISE IOBAN 66X60 STRL (DRAPES) ×2 IMPLANT
DRAPE TABLE BACK 80X90 (DRAPES) ×1 IMPLANT
DRSG OPSITE POSTOP 4X10 (GAUZE/BANDAGES/DRESSINGS) IMPLANT
ELECT BLADE 6.5 EXT (BLADE) ×1 IMPLANT
ELECT CAUTERY BLADE 6.4 (BLADE) ×1 IMPLANT
ELECTRODE BLDE 4.0 EZ CLN MEGD (MISCELLANEOUS) ×1 IMPLANT
ELECTRODE REM PT RTRN 9FT ADLT (ELECTROSURGICAL) ×1 IMPLANT
GAUZE 4X4 16PLY ~~LOC~~+RFID DBL (SPONGE) ×1 IMPLANT
GAUZE SPONGE 4X4 12PLY STRL (GAUZE/BANDAGES/DRESSINGS) ×2 IMPLANT
GAUZE XEROFORM 1X8 LF (GAUZE/BANDAGES/DRESSINGS) ×2 IMPLANT
GLOVE BIO SURGEON STRL SZ7.5 (GLOVE) ×2 IMPLANT
GLOVE BIOGEL PI IND STRL 8 (GLOVE) ×1 IMPLANT
GOWN SRG XL LVL 3 NONREINFORCE (GOWNS) ×1 IMPLANT
GOWN STRL REUS W/TWL LRG LVL4 (GOWN DISPOSABLE) ×1 IMPLANT
HEAD MOD COCR 28MM HD -3MM NK (Orthopedic Implant) IMPLANT
HOLSTER ELECTROSUGICAL PENCIL (MISCELLANEOUS) ×1 IMPLANT
KIT TURNOVER KIT A (KITS) ×1 IMPLANT
MANIFOLD NEPTUNE II (INSTRUMENTS) ×1 IMPLANT
NDL FILTER BLUNT 18X1 1/2 (NEEDLE) ×1 IMPLANT
NDL HYPO 22X1.5 SAFETY MO (MISCELLANEOUS) IMPLANT
NDL SPNL 18GX3.5 QUINCKE PK (NEEDLE) ×2 IMPLANT
NEEDLE FILTER BLUNT 18X1 1/2 (NEEDLE) ×1 IMPLANT
NEEDLE HYPO 22X1.5 SAFETY MO (MISCELLANEOUS) IMPLANT
NEEDLE SPNL 18GX3.5 QUINCKE PK (NEEDLE) ×2 IMPLANT
PACK HIP PROSTHESIS (MISCELLANEOUS) ×1 IMPLANT
PENCIL SMOKE EVACUATOR (MISCELLANEOUS) ×1 IMPLANT
PILLOW ABDUCTION FOAM SM (MISCELLANEOUS) IMPLANT
SHELL RINGBLOC BI POLR 28X45MM (Orthopedic Implant) IMPLANT
SOLN 0.9% NACL POUR BTL 1000ML (IV SOLUTION) ×1 IMPLANT
SOLN STERILE WATER 500 ML (IV SOLUTION) ×1 IMPLANT
SOLN STERILE WATER BTL 1000 ML (IV SOLUTION) ×1 IMPLANT
SOLUTION PREP PVP 2OZ (MISCELLANEOUS) ×1 IMPLANT
SPONGE T-LAP 18X18 ~~LOC~~+RFID (SPONGE) ×4 IMPLANT
STAPLER SKIN PROX 35W (STAPLE) ×1 IMPLANT
STEM HIP FEMORAL 9MMX130MM (Stem) IMPLANT
SUT STRATA PDS 2-0 CT-1 (SUTURE) ×1 IMPLANT
SUT STRATAFIX 14 PDO 36 VLT (SUTURE) ×1 IMPLANT
SUT TICRON 2-0 30IN 311381 (SUTURE) IMPLANT
SUT VIC AB 0 CT1 36 (SUTURE) IMPLANT
SUT VIC AB 2-0 CT1 (SUTURE) IMPLANT
SYR 10ML LL (SYRINGE) ×1 IMPLANT
SYR 30ML LL (SYRINGE) IMPLANT
SYR 50ML LL SCALE MARK (SYRINGE) IMPLANT
TIP BRUSH PULSAVAC PLUS 24.33 (MISCELLANEOUS) IMPLANT
TIP FAN IRRIG PULSAVAC PLUS (DISPOSABLE) ×1 IMPLANT
TRAP FLUID SMOKE EVACUATOR (MISCELLANEOUS) ×1 IMPLANT
TUBE SUCT KAM VAC (TUBING) ×1 IMPLANT

## 2024-05-17 NOTE — Assessment & Plan Note (Signed)
 Secondary to mechanical fall s/p hemiarthroplasty today. -Continue with pain management -PT and OT evaluation tomorrow

## 2024-05-17 NOTE — Assessment & Plan Note (Signed)
-   Continue home meds

## 2024-05-17 NOTE — Progress Notes (Addendum)
 Pt CBG: 67. Dr. Vicci notified. Acknowledged. Orders received. See MAR.

## 2024-05-17 NOTE — Hospital Course (Addendum)
 Partly taken from H&P.  Lisa [name] is a 78 y.o. female with medical history significant for chronic functional diarrhea, chronic electrolyte imbalance, prior history of DVT not on anticoagulation, hypertension, hyperlipidemia, chronic HFpEF, type 2 diabetes, chronic anxiety/depression, recently admitted (12/6-12/13/25) for acute on chronic HFpEF and electrolyte abnormalities, who presents to the ER after a mechanical fall at home.   On presentation hemodynamically stable, imaging with concern of left displaced femoral neck fracture, case was discussed with orthopedic surgery and they advised admission under TRH and patient will be taken to the OR during the day.  12/23: Blood pressure mildly elevated at 171/73, leukocytosis improved, magnesium  at 1.7, potassium 3.1-replating magnesium  and potassium. Patient was taken to the OR s/p left hemiarthroplasty-tolerated the procedure well.  12/24: Blood pressure mildly elevated, hemoglobin with some decreased to 9.3, some leukocytosis likely reactive.  CBG elevated above 200.  12/25: Patient had 1 episode of low-grade fever at 100.1, no obvious sign of infection or complaint.  Leukocytosis resolved.  Hemoglobin with further decreased to 7.7.  Awaiting SNF placement.  12/26: Remained hemodynamically stable with some improvement of hemoglobin to 8.6.  Had a bed offer-pending insurance authorization for SNF.  12/27: Medically stable with pending insurance authorization for SNF.  12/28: Hemodynamically stable, having some diarrhea after taking pills.  Will trial iron supplement with meals.  Awaiting insurance authorization.  12/29: Obtained authorization, facility will take her tomorrow.  Creon  was discontinued at her request stating that it is causing her upset stomach.  12/30: Remained hemodynamically stable.  We continued Creon  on discharge and rehab facility can discussed with patient and hold if she request to see if that will help with her  intermittent diarrhea.  Patient also has multiple eyedrops, couple of them to be used before upcoming eye surgery.  Please discussed with patient before resuming them.  He is also being provided with some pain medications to use as needed.  Please avoid constipation while taking opioids for pain.  Patient will follow-up with orthopedic surgery for her recent fracture and surgery.  Patient will continue the rest of her home medications and need to have a close follow-up with her providers for further assistance.

## 2024-05-17 NOTE — TOC CM/SW Note (Signed)
 Transition of Care [facility]) - Inpatient Brief Assessment   Patient Details  Name: Zaiya Annunziato MRN: [id number] Date of Birth: Mar 11, 1946  Transition of Care [facility]) CM/SW Contact:    Corean ONEIDA Haddock, RN Phone Number: 05/17/2024, 10:11 AM   Clinical Narrative:   Patient noted with femoral neck fracture. Plan for othro repair today PT OT pending Per SDOH housing, transportation, utility resources added to AVS    Transition of Care Asessment: Insurance and Status: Insurance coverage has been reviewed Patient has primary care physician: Yes     Prior/Current Home Services: No current home services Social Drivers of Health Review: SDOH reviewed interventions complete Readmission risk has been reviewed: Yes Transition of care needs: transition of care needs identified, TOC will continue to follow

## 2024-05-17 NOTE — Progress Notes (Signed)
 OT Cancellation Note  Patient Details Name: Kingston Guiles MRN: [id number] DOB: [birth year]   Cancelled Treatment:    Reason Eval/Treat Not Completed: Patient not medically ready. Per chart review, pt with mechanical fall resulting in left displaced femoral neck fracture. Plan to go to OR. Will follow with evaluation after surgical intervention is performed.   Izetta Claude, MS, OTR/L , CBIS ascom [phone]  05/17/2024, 8:41 AM

## 2024-05-17 NOTE — Assessment & Plan Note (Signed)
-   Continue home statin

## 2024-05-17 NOTE — Op Note (Signed)
 DATE OF SURGERY: 05/17/2024  PREOPERATIVE DIAGNOSIS: Left femoral neck fracture  POSTOPERATIVE DIAGNOSIS: Left femoral neck fracture  PROCEDURE: Left hip hemiarthroplasty (CPT [phone])  SURGEON: Jackquline CANDIE Barrack, MD  ASSISTANTS:   EBL: 100 cc  COMPONENTS:  Implant Name Type Inv. Item Serial No. Manufacturer Lot No. LRB No. Used Action  CEMENT BONE 40GM - SNA Cement CEMENT BONE 40GM NA [employer] [id number] Left 2 Implanted  STEM HIP FEMORAL [id number] - [id number] Stem STEM HIP FEMORAL [id number]  ZIMMER RECON(ORTH,TRAU,BIO,SG) [id number] Left 1 Implanted  CENTRALIZER STM [id number] STRL - [id number] Orthopedic Implant CENTRALIZER STM 11XPSTNR STRL  ZIMMER RECON(ORTH,TRAU,BIO,SG) [id number] Left 1 Implanted  HEAD MOD COCR HD - NK - [id number] Orthopedic Implant HEAD MOD COCR HD - NK  ZIMMER RECON(ORTH,TRAU,BIO,SG) [id number] Left 1 Implanted  SHELL RINGBLOC BI POLR 28X45MM - [id number] Orthopedic Implant SHELL RINGBLOC BI POLR 28X45MM  ZIMMER RECON(ORTH,TRAU,BIO,SG) [id number] Left 1 Implanted     INDICATIONS: Lisa [name] is a 78 y.o. female who sustained a displaced femoral neck fracture after a fall. Risks and benefits of hip hemiarthroplasty were explained to the patient. Risks include but are not limited to bleeding, infection, injury to tissues, nerves, vessels, periprosthetic infection, dislocation, limb length discrepancy and risks of anesthesia. The patient and/or family understands these risks, has completed an informed consent and wishes to proceed.   PROCEDURE:  The patient was identified in the preoperative holding area and the operative extremity was marked.  The patient was then transferred to the operating room suite and mobilized from the hospital gurney to the operating room table. General anesthesia was administered without complication. The patient was then transitioned to a lateral position.  All bony prominences were padded per protocol.  An axillary  roll was placed.  Careful attention was paid to the contralateral side peroneal nerve, which was free from pressure with use of appropriate padding and blankets. A time-out was performed to confirm the patient's identity and the correct laterality of surgery. The patient was then prepped and draped in the usual sterile fashion. Appropriate pre-operative antibiotics were administered. Tranexamic acid  was administered preoperatively.    An anterolateral approach to the hip was performed.  Careful attention was made to maintain hemostasis using electrocautery.  Dissection brought us  to the IT band, which was split. The anterior 1/3 of the hip abductors were then split exposing the joint capsule. A capsulotomy was then performed.  At this point, the femoral neck fracture was visualized. An oscillating saw was used to make a new neck cut approximately 15mm above the lesser tuberosity. The head was then freed from its remaining soft tissue attachments and measured.   We then turned our attention to preparing the femoral canal. First, a box cut was performed utilizing the box osteotome. A canal finder was inserted by hand and sequential broaching was then performed. The calcar planer was inserted onto the broach and used to smooth the calcar appropriately.  A trial stem, neck, and head were inserted into the acetabulum and placed through range of motion. The hip was again dislocated and the femoral trial components were removed. An appropriately sized cement restrictor was placed. The canal was irrigated with pulse lavage and dried. A cement gun was used to place cement into the femoral canal. Cement was then pressurized. The actual stem was inserted into the femoral canal and then driven onto the calcar. Position was held until the cement hardened. The trial heads were then again inserted on the femoral component  and one was found to be appropriate. Trial was removed and the permanent head/neck was Morse tapered onto  the femoral stem and then reduced into the acetabulum.    The hip stability and length were reassessed and found to be satisfactory.  The wound was then copiously irrigated with normal saline solution. The capsule was then closed with 0-0 Vicryl. The abductors were reattached to the proximal femur using 2-0 Ticron. A mixture of Exparel  and bupivicaine was administered.  The subdermal layer was closed with 2-0 Vicryl in a buried interrupted fashion. Skin was approximated with staples.  A sterile dressing was applied. An abduction pillow was placed. The patient was mobilized from the lateral position back to supine on the operating room table and then awakened from general anesthesia without complication.  POSTOPERATIVE PLAN: The patient will be WBAT on operative extremity. The patient may restart her Plavix  as DVT ppx on POD#1. IV Abx x 24 hours. PT/OT on POD#1. Hip precautions.

## 2024-05-17 NOTE — ED Notes (Signed)
 CCMD called to admit patient to monitor

## 2024-05-17 NOTE — ED Notes (Signed)
 Patient placed on purewick. Patient dressed out of clothes and placed in hospital gown. Belongings kept at bedside.

## 2024-05-17 NOTE — Assessment & Plan Note (Signed)
 Last 2D echo done on 02/26/2021 revealed LVEF 60 to 65% with grade 2 diastolic dysfunction.  Clinically appears euvolemic -Strict intake and output -Daily weight

## 2024-05-17 NOTE — Assessment & Plan Note (Signed)
 CBG within goal, A1c of 6.4 -Continue with SSI

## 2024-05-17 NOTE — Progress Notes (Addendum)
" °  Progress Note   Patient: Lisa [name] FMW:[id number] DOB: 1946/02/16 DOA: 05/16/2024     0 DOS: the patient was seen and examined on 05/17/2024   Brief hospital course: Partly taken from H&P.  Trilby Jiali Linney is a 78 y.o. female with medical history significant for chronic functional diarrhea, chronic electrolyte imbalance, prior history of DVT not on anticoagulation, hypertension, hyperlipidemia, chronic HFpEF, type 2 diabetes, chronic anxiety/depression, recently admitted (12/6-12/13/25) for acute on chronic HFpEF and electrolyte abnormalities, who presents to the ER after a mechanical fall at home.   On presentation hemodynamically stable, imaging with concern of left displaced femoral neck fracture, case was discussed with orthopedic surgery and they advised admission under TRH and patient will be taken to the OR during the day.  12/23: Blood pressure mildly elevated at 171/73, leukocytosis improved, magnesium  at 1.7, potassium 3.1-replating magnesium  and potassium. Patient was taken to the OR s/p left hemiarthroplasty-tolerated the procedure well.  Assessment and Plan: * Left displaced femoral neck fracture (HCC) Secondary to mechanical fall s/p hemiarthroplasty today. -Continue with pain management -PT and OT evaluation tomorrow  Hypokalemia Potassium of 3.1 with magnesium  of 1.7 today. - Replace potassium and magnesium  -Continue to monitor  Grade II diastolic dysfunction Last 2D echo done on 02/26/2021 revealed LVEF 60 to 65% with grade 2 diastolic dysfunction.  Clinically appears euvolemic -Strict intake and output -Daily weight  Hyperlipidemia - Continue home statin  Diabetes mellitus without complication (HCC) CBG within goal, A1c of 6.4 -Continue with SSI  Depression - Continue home meds   Subjective: Patient was still little somnolent when seen after surgery today.  Denies any significant pain.  Physical Exam: Vitals:   05/17/24 1422 05/17/24  1427 05/17/24 1444 05/17/24 1500  BP:   (!) 166/80 (!) 176/77  Pulse: (!) 52 62 60 62  Resp: 13 13 18    Temp:  97.7 F (36.5 C) 98.6 F (37 C)   TempSrc:      SpO2: 93% 95% 92%   Weight:      Height:       General.  Frail and malnourished elderly lady, in no acute distress. Pulmonary.  Lungs clear bilaterally, normal respiratory effort. CV.  Regular rate and rhythm, no JVD, rub or murmur. Abdomen.  Soft, nontender, nondistended, BS positive. CNS.  Alert and oriented .  No focal neurologic deficit. Extremities.  No edema, no cyanosis, pulses intact and symmetrical.  Data Reviewed: Prior data reviewed  Family Communication:   Disposition: Status is: Inpatient Remains inpatient appropriate because: Severity of illness  Planned Discharge Destination: To be determined  DVT prophylaxis.  SCDs-will start Lovenox  from tomorrow Time spent:  minutes  This record has been created using Conservation officer, historic buildings. Errors have been sought and corrected,but may not always be located. Such creation errors do not reflect on the standard of care.   Author: Amaryllis Dare, MD 05/17/2024 3:30 PM  For on call review [email or web address].  "

## 2024-05-17 NOTE — Transfer of Care (Signed)
 Immediate Anesthesia Transfer of Care Note  Patient: Lisa [name]  Procedure(s) Performed: HEMIARTHROPLASTY (BIPOLAR) HIP, POSTERIOR APPROACH FOR FRACTURE (Left)  Patient Location: PACU  Anesthesia Type:General  Level of Consciousness: sedated, patient cooperative, and responds to stimulation  Airway & Oxygen Therapy: Patient Spontanous Breathing and Patient connected to face mask oxygen  Post-op Assessment: Report given to RN, Post -op Vital signs reviewed and stable, and Patient moving all extremities  Post vital signs: Reviewed and stable  Last Vitals:  Vitals Value Taken Time  BP 149/73 05/17/24 14:00  Temp    Pulse 60 05/17/24 14:02  Resp 15 05/17/24 14:02  SpO2 99 % 05/17/24 14:02  Vitals shown include unfiled device data.  Last Pain:  Vitals:   05/17/24 1052  TempSrc: Temporal  PainSc: Asleep         Complications: No notable events documented.

## 2024-05-17 NOTE — Anesthesia Procedure Notes (Signed)
 Procedure Name: Intubation Date/Time: 05/17/2024 11:40 AM  Performed by: Antonetta Ronnald Caldron, CRNAPre-anesthesia Checklist: Patient identified, Emergency Drugs available, Suction available and Patient being monitored Patient Re-evaluated:Patient Re-evaluated prior to induction Oxygen Delivery Method: Circle system utilized Preoxygenation: Pre-oxygenation with 100% oxygen Induction Type: IV induction Ventilation: Mask ventilation without difficulty and Oral airway inserted - appropriate to patient size Laryngoscope Size: 3 and McGrath ([place] F, 1) Tube type: Oral Tube size: 6.5 mm Number of attempts: 2 Airway Equipment and Method: Stylet Placement Confirmation: ETT inserted through vocal cords under direct vision, positive ETCO2 and breath sounds checked- equal and bilateral Secured at: 21 cm Tube secured with: Tape Dental Injury: Teeth and Oropharynx as per pre-operative assessment  Comments: Initial look with Mac 3, unable to pass nonstyletted ETT changed to McGrath to expedite intubation. Poor positioning in hospital prior to laryngoscopy

## 2024-05-17 NOTE — Discharge Instructions (Addendum)
 Rent/Utility/Housing  Agency Name: [facility] Agency Address: [address] [place], [place] [address] Phone: [phone] Email: [email or web address] Website: [email or web address] Service(s) Offered: Housing services, self-sufficiency, congregate meal program, weatherization program, field seismologist program, emergency food assistance,  housing counseling, home ownership program, wheels -towork program.  Agency Name: [facility] Address: [address] [place], [place] [address] Phone: [phone] (8a-4p) [phone] (8p- 10p) Email: [email or web address] Website: [email or web address] Service(s) Offered: A program for homeless and/or needy men that includes one-on-one counseling, life skills training and job rehabilitation.  Agency Name: [facility] Address: [address] [place], [place] [address] Phone: [phone] Website: www.[email or web address] Service(s) Offered: Assistance to needy in emergency with utility bills, heating fuel, and prescriptions. Shelter for homeless 7pm-7am. September 18, 2016 15  Agency Name: [facility] [place] [facility]Developmentally Disabled) Address: [address], [place], [place] [address] Phone: [phone] Contact Person: Lemond Cart Email: [email or web address] Website: [email or web address] Service(s) Offered: Helps individuals with developmental disabilities move from housing that is more restrictive to homes where they  can achieve greater independence and have more  opportunities.  Agency Name: [facility] Address: [address], [place], [place] [address] Phone: [phone] Email: [email or web address] Website: [email or web address].org Service(s) Offered: Provides affordable housing for low-income families, elderly, and disabled individuals. Offer a wide range of  programs and services, from financial planning to  afterschool and summer programs.  Agency Name: [facility] Address: [address] [place], [place] [address] Phone: [phone] Service(s) Offered: Child support services; child welfare services; food stamps; Medicaid; work first family assistance; and aid with fuel,  rent, food and medicine.  Agency Name: [facility] [place] [employer]. Address: [facility] [address], [place], [place]  [address] Phone: [phone] Website: www.familyabuseservices.org Service(s) Offered: 24 hour Crisis Line: [phone]; 24 hour Emergency Shelter; Transitional Housing; Support Groups; Scientist, Physiological; [employer]; Hispanic Outreach: [phone];  Visitation Center: [phone].  Agency Name: [facility] [place]. Address: [address] [place], [place] [address] Phone: [phone] Service(s) Offered: CAP Services; Home and [employer]; Individual or Group Supports; Respite Care Non-Institutional Nursing;  Residential Supports; Respite Care and Personal Care Services; Transportation; Family and Friends Night; Recreational Activities; Three Nutritious Meals/Snacks; Consultation with Registered Dietician; Twenty-four hour Registered Nurse Access; Daily and [employer]; Camp [place] [employer]aves; [place] for the [employer] (During Summer Months) Bingo Night (Every  Wednesday Night); Special Populations Dance Night  (Every Tuesday Night); Professional Hair Care Services.  Agency Name: [facility] Address: [address], [place], [place] [address] Phone: [phone] Contact Person: Meade High Website: [email or web address].[email or web address], Medical) Offered: Residential treatment facility for women; food and  clothing, educational & employment development and  transportation to work; counsellor of financial skills;  parenting and family reunification; emotional and spiritual  support;  transitional housing for program graduates.  Agency Name: [facility] Address: [address] [place], [place] [address] Phone: [phone] Email: [email or web address] Website: [email or web address] Service(s) Offered: Public housing units for elderly, disabled, and low income people; housing choice vouchers for income eligible  applicants; shelter plus care vouchers; and Psychologist, Clinical.  Agency Name: [facility] [employer] Address: [address] [place], [place] [address] Phone: [phone] Email: [email or web address] Website: www.habitatalamance.org Service(s) Offered: Build houses for families in need of decent housing. Each adult in the family must invest 200 hours of labor on  someone elses house, work with volunteers to build their own house, attend classes  on budgeting, home maintenance, yard care, and attend homeowner association meetings.  Agency Name: [facility] Address: [address], [place], [place] [address] Phone: [phone] Website: www.rsli.org Service(s) Offered: Intermediate care facilities for intellectually delayed, Supervised Living in group homes for adults with developmental disabilities, Supervised Living for people who have dual diagnoses (MRMI), Independent Living, Supported Living, respite and a variety of CAP services, pre-vocational services, day supports, and [employer].  Agency Name: [place] [facility] [facility] Phone: [phone] Website: www.NCForeclosurePrevention.gov Service(s) Offered: Zero-interest, deferred loans to homeowners struggling to pay their mortgage. Call for more information.  Agency Name: [facility] Agency Address: [address], [place], [place] [address] Phone: [phone] Website: www.alamanceservices.org Service(s) Offered: Housing services, self-sufficiency, congregate meal program, and individual development account  program.  Agency Name: [facility] [place] Address: [address], [place], [place] [address] Phone: [phone] Email: [email or web address] Website: www.alliedchurches.org Service(s) Offered: Housing the homeless, feeding the hungry, company secretary, job and education related services.  Agency Name: [facility] Address: [address], [place], [place] [address] Phone: [phone] Email: [email or web address] Service(s) Offered: Counseling, problem pregnancy, advocacy for Hispanics, limited emergency financial assistance.  Agency Name: [facility] Address: [address] [place], [place] [address] Phone: [phone] Website: [email or web address] Service(s) Offered: Child support services; child welfare services; SNAP; Medicaid; work first family assistance; and aid with fuel,  rent, food and medicine.  Agency Name: [facility] Address: [address] [place], [place] [address] Phone: [phone] or [phone] Email: [email or web address] Service(s) Offered: Family services and transient assistance; emergency food, fuel, clothing, limited furniture, utilities; budget counseling, general counseling; give a kid a coat; thrift store; Christmas food and toys. Utility assistance, food pantry, rental  assistance, life sustaining medicine    Transportation Resources for [employer]  Agency Name: [facility] Agency Address: [address] [place], [place] [address] Phone: [phone] Email: [email or web address] Website: [email or web address] Service(s) Offered: Housing services, self-sufficiency, congregate meal program, weatherization program, field seismologist program, emergency food assistance,  housing counseling, home ownership program, wheels-towork program.  Agency Name: [facility]  [id number]) Address: [address], [place], [place] [address] Phone: [phone] Website: www.acta-Berger.com Service(s) Offered: Transportation for bluelinx, subscription and demand response; Dial-a-Ride for citizens 71 years of age or older.  Agency Name: [facility] Address: [address] [place], [place] [address] Phone: [phone] Service(s) Offered: Child support services; child welfare services; food stamps; Medicaid; work first family assistance; and aid with fuel,  rent, food and medicine, transportation assistance.  Agency Name: Disabled [employer] ([employer]) Transportation  Network Phone: [phone] Service(s) Offered: Transports veterans to the [facility] medical center. Call  forty-eight hours in advance and leave the name, telephone  number, date, and time of appointment. Veteran will be  contacted by the driver the day before the appointment to  arrange a pick up point    [employer] currently provides door to door services. ACTA connects with PART daily for services to [facility]. ACTA also performs contract services to [employer] operates 27 vehicles, all but 3 mini-vans are equipped with lifts for special needs as well as the general public. ACTA drivers are each CDL certified and trained in First Aid and CPR. ACTA was established in 2002 by [employer]. An independent Industrial/product Designer. ACTA operates via cytogeneticist with required local  10% match funding from [facility]. ACTA provides over 80,000 passenger trips each year, including [facility] and [employer] sites.  Call at least by 11 AM one business day prior to needing transportation  [employer].                      [place], [place] [address]     Office Hours: Monday-Friday  8 AM - 5 PM   Diet: As you were doing prior to  hospitalization   Shower:  May shower but keep the wounds dry, use an occlusive plastic wrap, NO SOAKING IN TUB.  If the bandage gets wet, change with a clean dry gauze.  Dressing:  You may change your dressing as needed. Change the dressing with sterile gauze dressing.    Activity:  Increase activity slowly as tolerated, but follow the weight bearing instructions below.  No lifting or driving for 6 weeks.  Weight Bearing:   Weight bearing as tolerated to left lower extremity  To prevent constipation: you may use a stool softener such as -  Colace (over the counter) 100 mg by mouth twice a day  Drink plenty of fluids (prune juice may be helpful) and high fiber foods Miralax  (over the counter) for constipation as needed.    Itching:  If you experience itching with your medications, try taking only a single pain pill, or even half a pain pill at a time.  You may take up to 10 pain pills per day, and you can also use benadryl  over the counter for itching or also to help with sleep.   Precautions:  If you experience chest pain or shortness of breath - call 911 immediately for transfer to the hospital emergency department!!  If you develop a fever greater that 101 F, purulent drainage from wound, increased redness or drainage from wound, or calf pain-Call [facility]                                              Follow- Up Appointment:  Please call for an appointment to be seen in 2 weeks at [employer] Name: [facility] Agency Address: [address] [place], [place] [address] Phone: [phone] Website: [email or web address] Service(s) Offered: Housing services, self-sufficiency, congregate meal program, and individual development account program.  Agency Name: [facility] [place] Address: [address], [place], [place] [address] Phone: [phone] Email: [email or web address] Website:  www.[email or web address].org Service(s) Offered: Housing the homeless, feeding the hungry, company secretary, job and education related services.  Agency Name: [facility] Address: [address] [place], [place] [address] Phone: [phone] Email: [email or web address] Service(s) Offered: Counseling, problem pregnancy, advocacy for Hispanics, limited emergency financial assistance.  Agency Name: [facility] Address: [address] [place], [place] [address] Phone: [phone] Website: [email or web address] Service(s) Offered: Child support services; child welfare services; SNAP; Medicaid; work first family assistance; and aid with fuel,  rent, food and medicine.  Agency Name: [facility] Address: [address] [place], [place] [address] Phone: [phone] or [phone] Email: [email or web address] Service(s) Offered: Family services and transient assistance; emergency food, fuel, clothing, limited furniture, utilities; budget counseling, general counseling; give a kid a coat; thrift store; Christmas food and toys. Utility assistance, food pantry, rental  assistance, life sustaining medicine

## 2024-05-17 NOTE — Assessment & Plan Note (Signed)
 Potassium of 3.1 with magnesium  of 1.7 today. - Replace potassium and magnesium  -Continue to monitor

## 2024-05-17 NOTE — H&P (Addendum)
 " History and Physical  Lisa [name] FMW:[id number] DOB: [birth year] DOA: 05/16/2024  Referring physician: Dr. Neomi, EDP  PCP: Glenard Mire, MD  Outpatient Specialists: GI, cardiology, vascular surgery. Patient coming from: Home.  Chief Complaint: Fall.  HPI: Lisa [name] is a 78 y.o. female with medical history significant for chronic functional diarrhea, chronic electrolyte imbalance, prior history of DVT not on anticoagulation, hypertension, hyperlipidemia, chronic HFpEF, type 2 diabetes, chronic anxiety/depression, recently admitted (12/6-12/13/25) for acute on chronic HFpEF and electrolyte abnormalities, who presents to the ER after a mechanical fall at home.  The patient was getting ready to sit on the commode when she lost her balance and fell.  She ambulates with a cane at baseline.  States she was finally doing well prior to this event.  In the ER, vital signs are stable.  Left hip x-ray revealed displaced left femoral neck fracture.  EDP discussed the case with orthopedic surgery.  Plan for orthopedic surgery repair on 05/17/2024.  N.p.o. after midnight.  Admitted by [facility], hospitalist service.  ED Course: Temperature 7.7.  BP 184/86, pulse 86, respiratory rate 16, O2 saturation 98% on room air.  Review of Systems: Review of systems as noted in the HPI. All other systems reviewed and are negative.   Past Medical History:  Diagnosis Date   Blindness of right eye    CHF (congestive heart failure) (HCC)    Depression    Diabetes mellitus without complication (HCC)    DVT of leg (deep venous thrombosis) (HCC) 2011   Glaucoma    Grade II diastolic dysfunction    Hyperlipidemia    Hypertension    Vascular disease    Past Surgical History:  Procedure Laterality Date   ABDOMINAL HYSTERECTOMY  1992   Total   CATARACT EXTRACTION W/PHACO Left 02/25/2023   Procedure: CATARACT EXTRACTION PHACO AND INTRAOCULAR LENS PLACEMENT (IOC) LEFT DIABETIC  malyugin;  Surgeon:  Mittie Gaskin, MD;  Location: [facility];  Service: Ophthalmology;  Laterality: Left;  10.78 0:49.1   COLONOSCOPY N/A 03/24/2024   Procedure: COLONOSCOPY;  Surgeon: Jinny Carmine, MD;  Location: [facility] ENDOSCOPY;  Service: Endoscopy;  Laterality: N/A;   ESOPHAGOGASTRODUODENOSCOPY N/A 03/24/2024   Procedure: EGD (ESOPHAGOGASTRODUODENOSCOPY);  Surgeon: Jinny Carmine, MD;  Location: [facility] ENDOSCOPY;  Service: Endoscopy;  Laterality: N/A;   EYE SURGERY     LOWER EXTREMITY ANGIOGRAPHY Left 09/03/2023   Procedure: Lower Extremity Angiography;  Surgeon: Marea Selinda RAMAN, MD;  Location: [facility];  Service: Cardiovascular;  Laterality: Left;   LOWER EXTREMITY ANGIOGRAPHY Left 11/05/2023   Procedure: Lower Extremity Angiography;  Surgeon: Marea Selinda RAMAN, MD;  Location: [facility];  Service: Cardiovascular;  Laterality: Left;   LOWER EXTREMITY INTERVENTION Left 09/03/2023   Procedure: LOWER EXTREMITY INTERVENTION;  Surgeon: Marea Selinda RAMAN, MD;  Location: [facility];  Service: Cardiovascular;  Laterality: Left;   LOWER EXTREMITY INTERVENTION Left 11/05/2023   Procedure: LOWER EXTREMITY INTERVENTION;  Surgeon: Marea Selinda RAMAN, MD;  Location: [facility];  Service: Cardiovascular;  Laterality: Left;   TOE AMPUTATION Left 2011   All of her toes on the left foot   VASCULAR SURGERY      Social History:  reports that she has been smoking cigarettes. She started smoking about 24 years ago. She has a 10.3 pack-year smoking history. She has never used smokeless tobacco. She reports current alcohol  use. She reports that she does not use drugs.   Allergies[1]  Family History  Problem Relation Age of Onset   Diabetes Mother    Heart disease Father    Diabetes Sister    Diabetes Sister    Diabetes Brother    Kidney disease Brother        Transplant   Heart disease Brother       Prior to Admission medications  Medication Sig Start Date End Date Taking? Authorizing  Provider  acetaminophen  (TYLENOL ) 500 MG tablet Take 2 tablets by mouth every 6 (six) hours as needed.   Yes [provider]  amLODipine  (NORVASC ) 10 MG tablet Take 1 tablet (10 mg total) by mouth daily. 02/02/24  Yes Sowles, Krichna, MD  aspirin  81 MG tablet Take 81 mg by mouth daily.   Yes [provider]  atropine  1 % ophthalmic solution Place 1 drop into the left eye 3 (three) times daily.   Yes [provider]  brimonidine  (ALPHAGAN ) 0.2 % ophthalmic solution Place 1 drop into both eyes 2 (two) times daily.   Yes [provider]  citalopram  (CELEXA ) 10 MG tablet Take 1 tablet (10 mg total) by mouth daily. 02/02/24  Yes Sowles, Krichna, MD  clopidogrel  (PLAVIX ) 75 MG tablet Take 1 tablet (75 mg total) by mouth daily. 11/05/23  Yes Dew, Selinda RAMAN, MD  cyanocobalamin  1000 MCG tablet Take 1,000 mcg by mouth daily.   Yes [provider]  dapagliflozin  propanediol (FARXIGA ) 10 MG TABS tablet Take 1 tablet (10 mg total) by mouth daily. TAKE 1 TABLET BY MOUTH EVERY DAY BEFORE BREAKFAST 02/02/24  Yes Sowles, Krichna, MD  diphenoxylate -atropine  (LOMOTIL ) 2.5-0.025 MG tablet Take 1 tablet by mouth 3 (three) times daily as needed for diarrhea or loose stools. 05/07/24  Yes Patel, Sona, MD  dorzolamide -timolol  (COSOPT ) 22.3-6.8 MG/ML ophthalmic solution Place 1 drop into both eyes 2 (two) times daily. 03/02/21  Yes [provider]  lipase/protease/amylase (CREON ) 36000 UNITS CPEP capsule Take 2 capsules (72,000 Units total) by mouth 3 (three) times daily before meals. 05/07/24  Yes Patel, Sona, MD  losartan  (COZAAR ) 100 MG tablet Take 1 tablet (100 mg total) by mouth daily. 02/02/24  Yes Sowles, Krichna, MD  metoprolol  succinate (TOPROL  XL) 25 MG 24 hr tablet Take 1/2 tablets (12.5 mg total) by mouth daily. 05/08/24 06/07/24 Yes Patel, Sona, MD  Multiple Vitamin (MULTIVITAMIN) tablet Take 1 tablet by mouth daily.   Yes [provider]  polyvinyl alcohol   (LIQUIFILM TEARS) 1.4 % ophthalmic solution Place 2 drops into the right eye as needed for dry eyes. 04/14/23  Yes Sreenath, Calvin NOVAK, MD  prednisoLONE  acetate (PRED FORTE ) 1 % ophthalmic suspension Place 1 drop into the left eye 4 (four) times daily. 03/07/21  Yes [provider]  rosuvastatin  (CRESTOR ) 10 MG tablet Take 1 tablet (10 mg total) by mouth daily. 02/02/24  Yes Sowles, Krichna, MD  traMADol  (ULTRAM ) 50 MG tablet Take 1 tablet (50 mg total) by mouth every 6 (six) hours as needed. 08/25/23  Yes Dew, Selinda RAMAN, MD  Vitamin D , Ergocalciferol , (DRISDOL ) 1.25 MG (50000 UNIT) CAPS capsule Take 1 capsule (50,000 Units total) by mouth every 7 (seven) days. 02/02/24  Yes Sowles, Krichna, MD  feeding supplement (ENSURE ENLIVE / ENSURE PLUS) LIQD Take 237 mLs by mouth 2 (two) times daily between meals. 04/14/23   Jhonny Calvin NOVAK, MD  gabapentin  (NEURONTIN ) 100 MG capsule Take 1 capsule (100 mg total) by mouth 3 (three) times daily. Patient not taking: Reported on 05/17/2024 11/05/23 11/04/24  Marea Selinda RAMAN, MD  nitroGLYCERIN  (NITROSTAT ) 0.4 MG SL tablet Place 1 tablet (0.4 mg total) under the tongue every 5 (five) minutes as needed for chest pain. Patient not taking: Reported on 05/17/2024 04/13/21   Tobie Calix, MD    Physical Exam: BP (!) 184/86 (BP Location: Right Arm)   Pulse 66   Temp 97.7 F (36.5 C) (Oral)   Resp 16   Ht 5' 4 (1.626 m)   Wt 54.4 kg   SpO2 98%   BMI 20.60 kg/m   General: 78 y.o. year-old female well developed well nourished in no acute distress.  Alert and oriented x3. Cardiovascular: Regular rate and rhythm with no rubs or gallops.  No thyromegaly or JVD noted.  No lower extremity edema. 2/4 pulses in all 4 extremities. Respiratory: Clear to auscultation with no wheezes or rales. Good inspiratory effort. Abdomen: Soft nontender nondistended with normal bowel sounds x4 quadrants. Muskuloskeletal: No cyanosis, clubbing or edema noted bilaterally Neuro: CN  II-XII intact, strength, sensation, reflexes Skin: No ulcerative lesions noted or rashes Psychiatry: Judgement and insight appear normal. Mood is appropriate for condition and setting          Labs on Admission:  Basic Metabolic Panel: Recent Labs  Lab 05/16/24 2331  NA 138  K 3.1*  CL 105  CO2 21*  GLUCOSE 149*  BUN 11  CREATININE 0.60  CALCIUM  8.6*   Liver Function Tests: No results for input(s): AST, ALT, ALKPHOS, BILITOT, PROT, ALBUMIN in the last 168 hours. No results for input(s): LIPASE, AMYLASE in the last 168 hours. No results for input(s): AMMONIA in the last 168 hours. CBC: Recent Labs  Lab 05/16/24 2331  WBC 12.1*  NEUTROABS 10.9*  HGB 9.8*  HCT 30.7*  MCV 88.0  PLT 262   Cardiac Enzymes: No results for input(s): CKTOTAL, CKMB, CKMBINDEX, TROPONINI in the last 168 hours.  BNP (last 3 results) No results for input(s): BNP in the last 8760 hours.  ProBNP (last 3 results) No results for input(s): PROBNP in the last 8760 hours.  CBG: No results for input(s): GLUCAP in the last 168 hours.  Radiological Exams on Admission: DG Chest Portable 1 View Result Date: 05/16/2024 EXAM: 1 VIEW(S) XRAY OF THE CHEST 05/16/2024 11:40:00 PM COMPARISON: 05/02/2024 CLINICAL HISTORY: pre op FINDINGS: LUNGS AND PLEURA: Mild diffuse interstitial prominence. No focal pulmonary opacity. No pleural effusion. No pneumothorax. HEART AND MEDIASTINUM: Cardiomegaly, unchanged. Aortic atherosclerosis and tortuosity. BONES AND SOFT TISSUES: No acute osseous abnormality. IMPRESSION: 1. Mild diffuse interstitial prominence no edema. Cardiomegaly. Electronically signed by: Norman Gatlin MD 05/16/2024 11:46 PM EST RP Workstation: [id number]   DG Hip Unilat W or Wo Pelvis 2-3 Views Left Result Date: 05/16/2024 CLINICAL DATA:  Left hip pain after fall. EXAM: DG HIP (WITH OR WITHOUT PELVIS) 2-3V LEFT COMPARISON:  None Available. FINDINGS: Displaced left  femoral neck fracture. Femoral head is seated, no hip dislocation. No pubic ramus fracture. The bones are diffusely under mineralized. IMPRESSION: Displaced left femoral neck fracture. Electronically Signed   By: Andrea Gasman M.D.   On: 05/16/2024 23:05   CT Head Wo Contrast Result Date: 05/16/2024 CLINICAL DATA:  Status post fall. EXAM: CT HEAD WITHOUT CONTRAST TECHNIQUE: Contiguous axial images were obtained from the base of the skull through the vertex without intravenous contrast. RADIATION DOSE REDUCTION: This exam was performed according to the departmental dose-optimization program which includes automated exposure control, adjustment of the mA and/or kV according to patient size and/or use of iterative reconstruction technique. COMPARISON:  04/07/2023 FINDINGS: Brain: No intracranial hemorrhage, mass effect, or midline shift. Stable atrophy and chronic small vessel ischemia. No hydrocephalus. The basilar cisterns are patent. Bilateral basal gangliar mineralization. No evidence of territorial infarct or acute ischemia. No extra-axial or intracranial fluid collection. Vascular: Atherosclerosis of skullbase vasculature without hyperdense vessel or abnormal calcification. Skull: No fracture or focal lesion. Sinuses/Orbits: Postsurgical change in the right globe. No acute findings. Other: None. IMPRESSION: 1. No acute intracranial abnormality. No skull fracture. 2. Stable atrophy and chronic small vessel ischemia. Electronically Signed   By: Andrea Gasman M.D.   On: 05/16/2024 22:01   CT Cervical Spine Wo Contrast Result Date: 05/16/2024 CLINICAL DATA:  Status post fall. EXAM: CT CERVICAL SPINE WITHOUT CONTRAST TECHNIQUE: Multidetector CT imaging of the cervical spine was performed without intravenous contrast. Multiplanar CT image reconstructions were also generated. RADIATION DOSE REDUCTION: This exam was performed according to the departmental dose-optimization program which includes automated  exposure control, adjustment of the mA and/or kV according to patient size and/or use of iterative reconstruction technique. COMPARISON:  02/25/2021 FINDINGS: Alignment: Straightening of normal lordosis. Redemonstrated trace retrolisthesis of C5 on C6. No traumatic subluxation. Skull base and vertebrae: No acute fracture. Vertebral body heights are maintained. The dens and skull base are intact. Degenerative pannus at C1-C2. Soft tissues and spinal canal: No prevertebral fluid or swelling. No visible canal hematoma. Disc levels: Degenerative disc disease is most prominent at C5-C6. Multilevel facet hypertrophy. Upper chest: No acute findings. Other: Carotid calcifications. IMPRESSION: Degenerative change in the cervical spine without acute fracture or subluxation. Electronically Signed   By: Andrea Gasman M.D.   On: 05/16/2024 21:59    EKG: I independently viewed the EKG done and my findings are as followed: Normal sinus rhythm rate of 65.  QTc 463.  Assessment/Plan Present on Admission:  Left displaced femoral neck fracture (HCC)  Principal Problem:   Left displaced femoral neck fracture (HCC)  Left displaced femoral neck fracture, post mechanical fall, POA Continue pain control and gentle IV fluid hydration. N.p.o. after midnight Plan for orthopedic surgery repair on 05/17/24.  Chronic HFpEF Euvolemic on exam Last 2D echo done on 02/26/2021 revealed LVEF 60 to 65% with grade 2 diastolic dysfunction. Monitor strict I's and O's and daily weight  Hypertension BP is not at goal, elevated Resume home oral antihypertensives Close monitor vital signs.  Hyperlipidemia Resume home statin  Chronic anxiety/depression Resume home regimen  Hypokalemia Serum potassium 3.1 Repleted orally Check magnesium  level Repeat BMP in the morning  Type 2 diabetes with hyperglycemia Last hemoglobin A1c 6.4 Insulin  sliding scale every 4 hours while NPO.  Leukocytosis, suspect reactive from  fracture Afebrile Nonseptic appearing Per the patient, she was feeling at her best prior to the fall. Follow CBC with differentials.    Time: 75 minutes.    DVT prophylaxis: SCDs.  Code Status: Full code.  Family Communication: None at bedside.  Disposition Plan: Admitted to MedSurg unit.  Consults called: Orthopedic surgery  Admission status: Inpatient status.   Status is: Inpatient The patient requires at least 2 midnights for further evaluation and treatment of present condition.   Terry LOISE Hurst MD [facility] Pager [phone]  If 7PM-7AM, please contact night-coverage [email or web address] Password [email or web address]  05/17/2024, 2:52 AM      [1]  Allergies Allergen Reactions   Other Itching    Nucinta - Hallucinations   Tapentadol Hcl Itching    Hallucinations   Actos  [Pioglitazone ]     History of CHF  Penicillins Rash   "

## 2024-05-17 NOTE — Progress Notes (Signed)
 Pt transferred from ED to 2C-206B. She is alert and fully oriented x 4, afebrile, NSR on the monitor. BP is high as documented below. She complains of severe left hip pain scale 10/10. The pain is alleviated after giving Fentanyl  and Oxycodone  PRN. Plan of care is reviewed. Pt will go to surgery at 11:00 am today. We will continue monitor.    05/17/24 0408  Vitals  Temp 97.8 F (36.6 C)  Temp Source Oral  BP (!) 171/73  MAP (mmHg) 103  BP Location Right Arm  BP Method Automatic  Patient Position (if appropriate) Lying  Pulse Rate 66  Pulse Rate Source Monitor  Resp 18  Level of Consciousness  Level of Consciousness Alert  MEWS COLOR  MEWS Score Color Green  Oxygen Therapy  SpO2 100 %  O2 Device Room Air  Pain Assessment  Pain Scale 0-10  Pain Score 10  Pain Type Acute pain  Pain Location Hip  Pain Orientation Left  Pain Intervention(s) Medication (See eMAR);Emotional support;Rest;Relaxation    Wendi Dash, RN

## 2024-05-17 NOTE — Anesthesia Preprocedure Evaluation (Addendum)
 "                                  Anesthesia Evaluation  Patient identified by MRN, date of birth, ID band Patient awake and Patient confused  General Assessment Comment:Pt is somnolent on exam with intermittent pausing in conversion. Brother states she has been intermittently confused.   Reviewed: Allergy & Precautions, H&P , NPO status , Patient's Chart, lab work & pertinent test results, reviewed documented beta blocker date and time   Airway Mallampati: II  TM Distance: >3 FB Neck ROM: full    Dental  (+) Poor Dentition, Missing, Chipped   Pulmonary Current Smoker and Patient abstained from smoking. Hypoxia in pre-op. Supplemental o   + rhonchi (mild)        Cardiovascular Exercise Tolerance: Poor hypertension, Pt. on home beta blockers and Pt. on medications (-) angina + Peripheral Vascular Disease and +CHF (Grade II DD)  Normal cardiovascular exam     Neuro/Psych  PSYCHIATRIC DISORDERS  Depression    negative neurological ROS     GI/Hepatic negative GI ROS, Neg liver ROS,,,  Endo/Other  diabetes, Type 2    Renal/GU      Musculoskeletal   Abdominal Normal abdominal exam  (+)   Peds  Hematology  (+) Blood dyscrasia, anemia   Anesthesia Other Findings Hypokalemia   The patient ambulates with a cane at baseline and sometimes uses a walke   recently admitted (12/6-12/13/25) for acute on chronic HFpEF and electrolyte abnormalities  Potassium-sensitive periodic paralysis  Past Medical History: No date: Blindness of right eye No date: CHF (congestive heart failure) (HCC) No date: Depression No date: Diabetes mellitus without complication (HCC) 2011: DVT of leg (deep venous thrombosis) (HCC) No date: Glaucoma No date: Grade II diastolic dysfunction No date: Hyperlipidemia No date: Hypertension No date: Vascular disease  Past Surgical History: 1992: ABDOMINAL HYSTERECTOMY     Comment:  Total 02/25/2023: CATARACT EXTRACTION W/PHACO; Left      Comment:  Procedure: CATARACT EXTRACTION PHACO AND INTRAOCULAR               LENS PLACEMENT (IOC) LEFT DIABETIC  malyugin;  Surgeon:               Mittie Gaskin, MD;  Location: [facility];              Service: Ophthalmology;  Laterality: Left;  10.78 0:49.1 03/24/2024: COLONOSCOPY; N/A     Comment:  Procedure: COLONOSCOPY;  Surgeon: Jinny Carmine, MD;                Location: [facility] ENDOSCOPY;  Service: Endoscopy;                Laterality: N/A; 03/24/2024: ESOPHAGOGASTRODUODENOSCOPY; N/A     Comment:  Procedure: EGD (ESOPHAGOGASTRODUODENOSCOPY);  Surgeon:               Jinny Carmine, MD;  Location: [facility] ENDOSCOPY;  Service:               Endoscopy;  Laterality: N/A; No date: EYE SURGERY 09/03/2023: LOWER EXTREMITY ANGIOGRAPHY; Left     Comment:  Procedure: Lower Extremity Angiography;  Surgeon: Marea Selinda RAMAN, MD;  Location: [facility];  Service:               Cardiovascular;  Laterality:  Left; 11/05/2023: LOWER EXTREMITY ANGIOGRAPHY; Left     Comment:  Procedure: Lower Extremity Angiography;  Surgeon: Marea Selinda RAMAN, MD;  Location: [facility];  Service:               Cardiovascular;  Laterality: Left; 09/03/2023: LOWER EXTREMITY INTERVENTION; Left     Comment:  Procedure: LOWER EXTREMITY INTERVENTION;  Surgeon: Marea Selinda RAMAN, MD;  Location: [facility];  Service:               Cardiovascular;  Laterality: Left; 11/05/2023: LOWER EXTREMITY INTERVENTION; Left     Comment:  Procedure: LOWER EXTREMITY INTERVENTION;  Surgeon: Marea Selinda RAMAN, MD;  Location: [facility];  Service:               Cardiovascular;  Laterality: Left; 2011: TOE AMPUTATION; Left     Comment:  All of her toes on the left foot No date: VASCULAR SURGERY  BMI    Body Mass Index: 20.02 kg/m      Reproductive/Obstetrics negative OB ROS                              Anesthesia  Physical Anesthesia Plan  ASA: 3  Anesthesia Plan: General ETT   Post-op Pain Management: Ofirmev  IV (intra-op)* and Toradol  IV (intra-op)*   Induction: Intravenous  PONV Risk Score and Plan: 2 and Ondansetron  and Dexamethasone   Airway Management Planned: Oral ETT  Additional Equipment:   Intra-op Plan:   Post-operative Plan: Extubation in OR  Informed Consent: I have reviewed the patients History and Physical, chart, labs and discussed the procedure including the risks, benefits and alternatives for the proposed anesthesia with the patient or authorized representative who has indicated his/her understanding and acceptance.     Dental Advisory Given  Plan Discussed with: CRNA and Surgeon  Anesthesia Plan Comments:          Anesthesia Quick Evaluation  "

## 2024-05-17 NOTE — Consult Note (Signed)
 "                                                                ORTHOPAEDIC CONSULTATION  REQUESTING PHYSICIAN: ED  Chief Complaint:   Left hip pain  History of Present Illness: Miniya Saragrace Selke is a 78 y.o. female with a past medical history of congestive heart failure, diabetes, hypertension who had a fall yesterday.  The patient noted immediate hip pain and inability to ambulate.  The patient ambulates with a cane at baseline and sometimes uses a walker. Pain is worse with any sort of movement.  X-rays in the emergency department show a left displaced femoral neck fracture.  She lives on her own.  Of note, she has peripheral neuropathy from her diabetes and has undergone a left midfoot amputation.  She has minimal sensation to her foot/ankle.  She is unable to move her ankle and plantar dorsiflexion very well.  Past Medical History:  Diagnosis Date   Blindness of right eye    CHF (congestive heart failure) (HCC)    Depression    Diabetes mellitus without complication (HCC)    DVT of leg (deep venous thrombosis) (HCC) 2011   Glaucoma    Grade II diastolic dysfunction    Hyperlipidemia    Hypertension    Vascular disease    Past Surgical History:  Procedure Laterality Date   ABDOMINAL HYSTERECTOMY  1992   Total   CATARACT EXTRACTION W/PHACO Left 02/25/2023   Procedure: CATARACT EXTRACTION PHACO AND INTRAOCULAR LENS PLACEMENT (IOC) LEFT DIABETIC  malyugin;  Surgeon: Mittie Gaskin, MD;  Location: [facility];  Service: Ophthalmology;  Laterality: Left;  10.78 0:49.1   COLONOSCOPY N/A 03/24/2024   Procedure: COLONOSCOPY;  Surgeon: Jinny Carmine, MD;  Location: [facility] ENDOSCOPY;  Service: Endoscopy;  Laterality: N/A;   ESOPHAGOGASTRODUODENOSCOPY N/A 03/24/2024   Procedure: EGD (ESOPHAGOGASTRODUODENOSCOPY);  Surgeon: Jinny Carmine, MD;  Location: [facility] ENDOSCOPY;  Service: Endoscopy;  Laterality: N/A;   EYE SURGERY     LOWER EXTREMITY ANGIOGRAPHY Left 09/03/2023    Procedure: Lower Extremity Angiography;  Surgeon: Marea Selinda RAMAN, MD;  Location: [facility] INVASIVE CV LAB;  Service: Cardiovascular;  Laterality: Left;   LOWER EXTREMITY ANGIOGRAPHY Left 11/05/2023   Procedure: Lower Extremity Angiography;  Surgeon: Marea Selinda RAMAN, MD;  Location: [facility] INVASIVE CV LAB;  Service: Cardiovascular;  Laterality: Left;   LOWER EXTREMITY INTERVENTION Left 09/03/2023   Procedure: LOWER EXTREMITY INTERVENTION;  Surgeon: Marea Selinda RAMAN, MD;  Location: [facility] INVASIVE CV LAB;  Service: Cardiovascular;  Laterality: Left;   LOWER EXTREMITY INTERVENTION Left 11/05/2023   Procedure: LOWER EXTREMITY INTERVENTION;  Surgeon: Marea Selinda RAMAN, MD;  Location: [facility] INVASIVE CV LAB;  Service: Cardiovascular;  Laterality: Left;   TOE AMPUTATION Left 2011   All of her toes on the left foot   VASCULAR SURGERY     Social History   Socioeconomic History   Marital status: Widowed    Spouse name: Not on file   Number of children: 0   Years of education: Not on file   Highest education level: Some college, no degree  Occupational History   Occupation: Retired  Tobacco Use   Smoking status: Some Days    Current packs/day: 0.25    Average packs/day: 0.3 packs/day for  41.3 years (10.3 ttl pk-yrs)    Types: Cigarettes    Start date: 03/16/2000   Smokeless tobacco: Never  Vaping Use   Vaping status: Never Used  Substance and Sexual Activity   Alcohol  use: Yes    Comment: Wine Ocassionally   Drug use: No   Sexual activity: Not Currently  Other Topics Concern   Not on file  Social History Narrative   Not on file   Social Drivers of Health   Tobacco Use: High Risk (05/16/2024)   Patient History    Smoking Tobacco Use: Some Days    Smokeless Tobacco Use: Never    Passive Exposure: Not on file  Financial Resource Strain: Medium Risk (04/08/2023)   Overall Financial Resource Strain (CARDIA)    Difficulty of Paying Living Expenses: Somewhat hard  Food Insecurity: No Food Insecurity (05/17/2024)    Epic    Worried About Programme Researcher, Broadcasting/film/video in the Last Year: Never true    Ran Out of Food in the Last Year: Never true  Transportation Needs: Unmet Transportation Needs (05/17/2024)   Epic    Lack of Transportation (Medical): Yes    Lack of Transportation (Non-Medical): Yes  Physical Activity: Not on file  Stress: Not on file  Social Connections: Unknown (05/17/2024)   Social Connection and Isolation Panel    Frequency of Communication with Friends and Family: Patient declined    Frequency of Social Gatherings with Friends and Family: Patient declined    Attends Religious Services: 1 to 4 times per year    Active Member of [employer] or Organizations: Yes    Attends Banker Meetings: 1 to 4 times per year    Marital Status: Widowed  Recent Concern: Social Connections - Socially Isolated (03/23/2024)   Social Connection and Isolation Panel    Frequency of Communication with Friends and Family: More than three times a week    Frequency of Social Gatherings with Friends and Family: Never    Attends Religious Services: Never    Database Administrator or Organizations: No    Attends Banker Meetings: Never    Marital Status: Widowed  Depression (PHQ2-9): Low Risk (02/02/2024)   Depression (PHQ2-9)    PHQ-2 Score: 3  Alcohol  Screen: Low Risk (02/23/2023)   Alcohol  Screen    Last Alcohol  Screening Score (AUDIT): 0  Housing: High Risk (05/17/2024)   Epic    Unable to Pay for Housing in the Last Year: Yes    Number of Times Moved in the Last Year: 0    Homeless in the Last Year: No  Utilities: At Risk (05/17/2024)   Epic    Threatened with loss of utilities: Yes  Health Literacy: Not on file   Family History  Problem Relation Age of Onset   Diabetes Mother    Heart disease Father    Diabetes Sister    Diabetes Sister    Diabetes Brother    Kidney disease Brother        Transplant   Heart disease Brother    Allergies[1] Prior to Admission medications   Medication Sig Start Date End Date Taking? Authorizing Provider  acetaminophen  (TYLENOL ) 500 MG tablet Take 2 tablets by mouth every 6 (six) hours as needed.   Yes [provider]  amLODipine  (NORVASC ) 10 MG tablet Take 1 tablet (10 mg total) by mouth daily. 02/02/24  Yes Sowles, Krichna, MD  aspirin  81 MG tablet Take 81 mg by mouth daily.  Yes [provider]  atropine  1 % ophthalmic solution Place 1 drop into the left eye 3 (three) times daily.   Yes [provider]  brimonidine  (ALPHAGAN ) 0.2 % ophthalmic solution Place 1 drop into both eyes 2 (two) times daily.   Yes [provider]  citalopram  (CELEXA ) 10 MG tablet Take 1 tablet (10 mg total) by mouth daily. 02/02/24  Yes Sowles, Krichna, MD  clopidogrel  (PLAVIX ) 75 MG tablet Take 1 tablet (75 mg total) by mouth daily. 11/05/23  Yes Dew, Selinda RAMAN, MD  cyanocobalamin  1000 MCG tablet Take 1,000 mcg by mouth daily.   Yes [provider]  dapagliflozin  propanediol (FARXIGA ) 10 MG TABS tablet Take 1 tablet (10 mg total) by mouth daily. TAKE 1 TABLET BY MOUTH EVERY DAY BEFORE BREAKFAST 02/02/24  Yes Sowles, Krichna, MD  diphenoxylate -atropine  (LOMOTIL ) 2.5-0.025 MG tablet Take 1 tablet by mouth 3 (three) times daily as needed for diarrhea or loose stools. 05/07/24  Yes Patel, Sona, MD  dorzolamide -timolol  (COSOPT ) 22.3-6.8 MG/ML ophthalmic solution Place 1 drop into both eyes 2 (two) times daily. 03/02/21  Yes [provider]  lipase/protease/amylase (CREON ) 36000 UNITS CPEP capsule Take 2 capsules (72,000 Units total) by mouth 3 (three) times daily before meals. 05/07/24  Yes Patel, Sona, MD  losartan  (COZAAR ) 100 MG tablet Take 1 tablet (100 mg total) by mouth daily. 02/02/24  Yes Sowles, Krichna, MD  metoprolol  succinate (TOPROL  XL) 25 MG 24 hr tablet Take 1/2 tablets (12.5 mg total) by mouth daily. 05/08/24 06/07/24 Yes Patel, Sona, MD  Multiple Vitamin (MULTIVITAMIN) tablet Take 1 tablet by mouth daily.    Yes [provider]  polyvinyl alcohol  (LIQUIFILM TEARS) 1.4 % ophthalmic solution Place 2 drops into the right eye as needed for dry eyes. 04/14/23  Yes Sreenath, Sudheer B, MD  prednisoLONE  acetate (PRED FORTE ) 1 % ophthalmic suspension Place 1 drop into the left eye 4 (four) times daily. 03/07/21  Yes [provider]  rosuvastatin  (CRESTOR ) 10 MG tablet Take 1 tablet (10 mg total) by mouth daily. 02/02/24  Yes Sowles, Krichna, MD  traMADol  (ULTRAM ) 50 MG tablet Take 1 tablet (50 mg total) by mouth every 6 (six) hours as needed. 08/25/23  Yes Dew, Selinda RAMAN, MD  Vitamin D , Ergocalciferol , (DRISDOL ) 1.25 MG (50000 UNIT) CAPS capsule Take 1 capsule (50,000 Units total) by mouth every 7 (seven) days. 02/02/24  Yes Sowles, Krichna, MD  feeding supplement (ENSURE ENLIVE / ENSURE PLUS) LIQD Take 237 mLs by mouth 2 (two) times daily between meals. 04/14/23   Jhonny Calvin NOVAK, MD  gabapentin  (NEURONTIN ) 100 MG capsule Take 1 capsule (100 mg total) by mouth 3 (three) times daily. Patient not taking: Reported on 05/17/2024 11/05/23 11/04/24  Marea Selinda RAMAN, MD  nitroGLYCERIN  (NITROSTAT ) 0.4 MG SL tablet Place 1 tablet (0.4 mg total) under the tongue every 5 (five) minutes as needed for chest pain. Patient not taking: Reported on 05/17/2024 04/13/21   Tobie Calix, MD   Recent Labs    05/16/24 2331 05/17/24 0529  WBC 12.1* 10.5  HGB 9.8* 10.3*  HCT 30.7* 31.7*  PLT 262 305  K 3.1* 3.1*  CL 105 104  CO2 21* 22  BUN 11 9  CREATININE 0.60 0.64  GLUCOSE 149* 119*  CALCIUM  8.6* 8.6*   DG Chest Portable 1 View Result Date: 05/16/2024 EXAM: 1 VIEW(S) XRAY OF THE CHEST 05/16/2024 11:40:00 PM COMPARISON: 05/02/2024 CLINICAL HISTORY: pre op FINDINGS: LUNGS AND PLEURA: Mild diffuse interstitial prominence. No  focal pulmonary opacity. No pleural effusion. No pneumothorax. HEART AND MEDIASTINUM: Cardiomegaly, unchanged. Aortic atherosclerosis and tortuosity. BONES AND SOFT TISSUES: No acute osseous  abnormality. IMPRESSION: 1. Mild diffuse interstitial prominence no edema. Cardiomegaly. Electronically signed by: Norman Gatlin MD 05/16/2024 11:46 PM EST RP Workstation: [id number]   DG Hip Unilat W or Wo Pelvis 2-3 Views Left Result Date: 05/16/2024 CLINICAL DATA:  Left hip pain after fall. EXAM: DG HIP (WITH OR WITHOUT PELVIS) 2-3V LEFT COMPARISON:  None Available. FINDINGS: Displaced left femoral neck fracture. Femoral head is seated, no hip dislocation. No pubic ramus fracture. The bones are diffusely under mineralized. IMPRESSION: Displaced left femoral neck fracture. Electronically Signed   By: Andrea Gasman M.D.   On: 05/16/2024 23:05   CT Head Wo Contrast Result Date: 05/16/2024 CLINICAL DATA:  Status post fall. EXAM: CT HEAD WITHOUT CONTRAST TECHNIQUE: Contiguous axial images were obtained from the base of the skull through the vertex without intravenous contrast. RADIATION DOSE REDUCTION: This exam was performed according to the departmental dose-optimization program which includes automated exposure control, adjustment of the mA and/or kV according to patient size and/or use of iterative reconstruction technique. COMPARISON:  04/07/2023 FINDINGS: Brain: No intracranial hemorrhage, mass effect, or midline shift. Stable atrophy and chronic small vessel ischemia. No hydrocephalus. The basilar cisterns are patent. Bilateral basal gangliar mineralization. No evidence of territorial infarct or acute ischemia. No extra-axial or intracranial fluid collection. Vascular: Atherosclerosis of skullbase vasculature without hyperdense vessel or abnormal calcification. Skull: No fracture or focal lesion. Sinuses/Orbits: Postsurgical change in the right globe. No acute findings. Other: None. IMPRESSION: 1. No acute intracranial abnormality. No skull fracture. 2. Stable atrophy and chronic small vessel ischemia. Electronically Signed   By: Andrea Gasman M.D.   On: 05/16/2024 22:01   CT Cervical Spine Wo  Contrast Result Date: 05/16/2024 CLINICAL DATA:  Status post fall. EXAM: CT CERVICAL SPINE WITHOUT CONTRAST TECHNIQUE: Multidetector CT imaging of the cervical spine was performed without intravenous contrast. Multiplanar CT image reconstructions were also generated. RADIATION DOSE REDUCTION: This exam was performed according to the departmental dose-optimization program which includes automated exposure control, adjustment of the mA and/or kV according to patient size and/or use of iterative reconstruction technique. COMPARISON:  02/25/2021 FINDINGS: Alignment: Straightening of normal lordosis. Redemonstrated trace retrolisthesis of C5 on C6. No traumatic subluxation. Skull base and vertebrae: No acute fracture. Vertebral body heights are maintained. The dens and skull base are intact. Degenerative pannus at C1-C2. Soft tissues and spinal canal: No prevertebral fluid or swelling. No visible canal hematoma. Disc levels: Degenerative disc disease is most prominent at C5-C6. Multilevel facet hypertrophy. Upper chest: No acute findings. Other: Carotid calcifications. IMPRESSION: Degenerative change in the cervical spine without acute fracture or subluxation. Electronically Signed   By: Andrea Gasman M.D.   On: 05/16/2024 21:59     Positive ROS: All other systems have been reviewed and were otherwise negative with the exception of those mentioned in the HPI and as above.  Physical Exam: BP (!) 171/73 (BP Location: Right Arm)   Pulse 66   Temp 97.8 F (36.6 C) (Oral)   Resp 17   Ht 5' 4 (1.626 m)   Wt 52.9 kg   SpO2 100%   BMI 20.02 kg/m  General:  Alert, no acute distress Psychiatric:  Patient is competent for consent with normal mood and affect    Orthopedic Exam:  Lower Extremity: + DF/PF/EHL SILT grossly over foot Foot wwp +Log roll/axial load Leg shortened and  externally rotated   Imaging:  As above: Left displaced femoral neck fracture  Assessment/Plan: Haiden Clucas  is a 78 y.o. female with a left displaced femoral neck fracture  1. I discussed the various treatment options including both surgical and non-surgical management of the fracture with the patient. We discussed the high risk of perioperative complications due to patient's age, dementia, and other co-morbidities.  Risks discussed included but were not limited to infection, injury to nearby anatomic structures, bleeding/hematoma, dislocation, periprosthetic fracture, difficulty/pain with ambulation, blood clot/pulmonary embolism, fat embolism, MI, stroke, death.  After discussion of risks, benefits, and alternatives to surgery, the patient were in agreement to proceed with surgery. The goals of surgery would be to provide adequate pain relief and allow for mobilization. Plan for surgery is left hip hemiarthroplasty. 2. NPO until OR 3. Hold anticoagulation in advance of OR 4. PT postop   Jackquline GORMAN Barrack 05/17/2024 8:16 AM     [1]  Allergies Allergen Reactions   Other Itching    Nucinta - Hallucinations   Tapentadol Hcl Itching    Hallucinations   Actos  [Pioglitazone ]     History of CHF   Penicillins Rash   "

## 2024-05-17 NOTE — Progress Notes (Signed)
 Patient refusing insulin  at this time. Patient states that she will not start taking insulin  and that she takes farxiga  at home. Patient educated on purpose of sliding scale and importance of taking insulin  because her blood sugar is now 306. Patient states that she does not want any insulin  and that she will only take her pills.

## 2024-05-18 DIAGNOSIS — E785 Hyperlipidemia, unspecified: Secondary | ICD-10-CM

## 2024-05-18 DIAGNOSIS — E119 Type 2 diabetes mellitus without complications: Secondary | ICD-10-CM | POA: Diagnosis not present

## 2024-05-18 DIAGNOSIS — E876 Hypokalemia: Secondary | ICD-10-CM

## 2024-05-18 DIAGNOSIS — D62 Acute posthemorrhagic anemia: Secondary | ICD-10-CM | POA: Diagnosis not present

## 2024-05-18 DIAGNOSIS — S72002A Fracture of unspecified part of neck of left femur, initial encounter for closed fracture: Secondary | ICD-10-CM | POA: Diagnosis not present

## 2024-05-18 DIAGNOSIS — F32A Depression, unspecified: Secondary | ICD-10-CM

## 2024-05-18 DIAGNOSIS — I5189 Other ill-defined heart diseases: Secondary | ICD-10-CM | POA: Diagnosis not present

## 2024-05-18 LAB — CBC
HCT: 29 % — ABNORMAL LOW (ref 36.0–46.0)
Hemoglobin: 9.3 g/dL — ABNORMAL LOW (ref 12.0–15.0)
MCH: 28.1 pg (ref 26.0–34.0)
MCHC: 32.1 g/dL (ref 30.0–36.0)
MCV: 87.6 fL (ref 80.0–100.0)
Platelets: 278 K/uL (ref 150–400)
RBC: 3.31 MIL/uL — ABNORMAL LOW (ref 3.87–5.11)
RDW: 20.6 % — ABNORMAL HIGH (ref 11.5–15.5)
WBC: 13.5 K/uL — ABNORMAL HIGH (ref 4.0–10.5)
nRBC: 0 % (ref 0.0–0.2)

## 2024-05-18 LAB — BASIC METABOLIC PANEL WITH GFR
Anion gap: 13 (ref 5–15)
BUN: 14 mg/dL (ref 8–23)
CO2: 19 mmol/L — ABNORMAL LOW (ref 22–32)
Calcium: 8 mg/dL — ABNORMAL LOW (ref 8.9–10.3)
Chloride: 105 mmol/L (ref 98–111)
Creatinine, Ser: 0.82 mg/dL (ref 0.44–1.00)
GFR, Estimated: >60 mL/min
Glucose, Bld: 256 mg/dL — ABNORMAL HIGH (ref 70–99)
Potassium: 3.9 mmol/L (ref 3.5–5.1)
Sodium: 138 mmol/L (ref 135–145)

## 2024-05-18 LAB — GLUCOSE, CAPILLARY
Glucose-Capillary: 255 mg/dL — ABNORMAL HIGH (ref 70–99)
Glucose-Capillary: 220 mg/dL — ABNORMAL HIGH (ref 70–99)
Glucose-Capillary: 219 mg/dL — ABNORMAL HIGH (ref 70–99)
Glucose-Capillary: 192 mg/dL — ABNORMAL HIGH (ref 70–99)
Glucose-Capillary: 233 mg/dL — ABNORMAL HIGH (ref 70–99)

## 2024-05-18 MED ORDER — INSULIN ASPART 100 UNIT/ML IJ SOLN
0.0000 [IU] | Freq: Every day | INTRAMUSCULAR | Status: DC
  Administered 2024-05-18: 2 [IU] via SUBCUTANEOUS
  Filled 2024-05-18: qty 2

## 2024-05-18 MED ORDER — FE FUM-VIT C-VIT B12-FA 460-60-0.01-1 MG PO CAPS
1.0000 | ORAL_CAPSULE | Freq: Two times a day (BID) | ORAL | Status: DC
  Administered 2024-05-18 – 2024-05-24 (×12): 1 via ORAL
  Filled 2024-05-18 (×9): qty 1

## 2024-05-18 MED ORDER — BRIMONIDINE TARTRATE 0.2 % OP SOLN
1.0000 [drp] | Freq: Two times a day (BID) | OPHTHALMIC | Status: DC
  Administered 2024-05-18 – 2024-05-24 (×12): 1 [drp] via OPHTHALMIC
  Filled 2024-05-18: qty 5

## 2024-05-18 MED ORDER — PREDNISOLONE ACETATE 1 % OP SUSP
1.0000 [drp] | Freq: Four times a day (QID) | OPHTHALMIC | Status: DC
  Administered 2024-05-18 – 2024-05-24 (×22): 1 [drp] via OPHTHALMIC
  Filled 2024-05-18: qty 1

## 2024-05-18 MED ORDER — DORZOLAMIDE HCL-TIMOLOL MAL 2-0.5 % OP SOLN
1.0000 [drp] | Freq: Two times a day (BID) | OPHTHALMIC | Status: DC
  Administered 2024-05-18 – 2024-05-24 (×12): 1 [drp] via OPHTHALMIC
  Filled 2024-05-18 (×2): qty 10

## 2024-05-18 MED ORDER — INSULIN ASPART 100 UNIT/ML IJ SOLN
0.0000 [IU] | Freq: Three times a day (TID) | INTRAMUSCULAR | Status: DC
  Administered 2024-05-19: 3 [IU] via SUBCUTANEOUS
  Administered 2024-05-20: 2 [IU] via SUBCUTANEOUS
  Administered 2024-05-21: 3 [IU] via SUBCUTANEOUS
  Administered 2024-05-21 – 2024-05-22 (×3): 2 [IU] via SUBCUTANEOUS
  Administered 2024-05-23: 3 [IU] via SUBCUTANEOUS
  Administered 2024-05-23 – 2024-05-24 (×2): 2 [IU] via SUBCUTANEOUS
  Filled 2024-05-18: qty 2
  Filled 2024-05-18: qty 3
  Filled 2024-05-18: qty 2
  Filled 2024-05-18: qty 3
  Filled 2024-05-18: qty 5
  Filled 2024-05-18: qty 3

## 2024-05-18 MED ORDER — PANCRELIPASE (LIP-PROT-AMYL) 12000-38000 UNITS PO CPEP
72000.0000 [IU] | ORAL_CAPSULE | Freq: Three times a day (TID) | ORAL | Status: DC
  Administered 2024-05-18 – 2024-05-22 (×7): 72000 [IU] via ORAL
  Filled 2024-05-18 (×7): qty 6

## 2024-05-18 MED ORDER — CLOPIDOGREL BISULFATE 75 MG PO TABS
75.0000 mg | ORAL_TABLET | Freq: Every day | ORAL | Status: DC
  Administered 2024-05-18 – 2024-05-24 (×7): 75 mg via ORAL
  Filled 2024-05-18 (×5): qty 1

## 2024-05-18 MED ORDER — INSULIN ASPART 100 UNIT/ML IJ SOLN
3.0000 [IU] | Freq: Three times a day (TID) | INTRAMUSCULAR | Status: DC
  Administered 2024-05-20 – 2024-05-24 (×5): 3 [IU] via SUBCUTANEOUS
  Filled 2024-05-18 (×3): qty 3

## 2024-05-18 NOTE — Assessment & Plan Note (Signed)
 Last 2D echo done on 02/26/2021 revealed LVEF 60 to 65% with grade 2 diastolic dysfunction.  Clinically appears euvolemic -Strict intake and output -Daily weight

## 2024-05-18 NOTE — Assessment & Plan Note (Signed)
Resolved with repletion. -Continue to monitor 

## 2024-05-18 NOTE — Evaluation (Signed)
 Occupational Therapy Evaluation Patient Details Name: Lisa [name] MRN: [id number] DOB: June 06, 1945 Today's Date: 05/18/2024   History of Present Illness   Pt is a 78 y.o. female s/p L hip hemiarthroplasty on 12/23 secondary to mechanical fall. PMH significant for HTN, DVT not on anticoagulation, chronic functional diarrhea, L TMA, R eye blindness, HLD, CHF, Type II DM, recently admitted (12/6-12/13/25) for acute on chronic HFpEF and electrolyte abnormalities.     Clinical Impressions Pt seen for second OT session to focus on functional transfers and mobility. Pt continues to require redirection as she self-directs session with poor safety awareness, lacks insight and judgement. Anticipate continued reinforcement of posterior hip precautions for all mobility and LB ADLs. +2 to perform STS from recliner, requires max multimodal cues for safe body mechanics to set up transfer appropriately, MOD A +2 to take small pivotal steps back to bed. MAX A +2 with assist for trunk and BLE to return to supine. Pt reports she plans to return home alone despite acknowledgement of need for +2 assist for transfers, highly recommend discharge to STR vs home alone as pt is a significant falls risk. OT will follow acutely.      If plan is discharge home, recommend the following:   Two people to help with walking and/or transfers;Two people to help with bathing/dressing/bathroom;Help with stairs or ramp for entrance     Functional Status Assessment   Patient has had a recent decline in their functional status and demonstrates the ability to make significant improvements in function in a reasonable and predictable amount of time.     Equipment Recommendations   BSC/3in1      Precautions/Restrictions   Precautions Precautions: Fall;Posterior Hip Recall of Precautions/Restrictions: Impaired Precaution/Restrictions Comments: per secure chat with MD Dalton posterior hip precautions, WBAT (hx of  L TMA) Restrictions Weight Bearing Restrictions Per Provider Order: Yes LLE Weight Bearing Per Provider Order: Weight bearing as tolerated Other Position/Activity Restrictions: posterior hip     Mobility Bed Mobility Overal bed mobility: Needs Assistance Bed Mobility: Sit to Supine Rolling: Min assist Sidelying to sit: Min assist, Mod assist, +2 for physical assistance   Sit to supine: Max assist, +2 for physical assistance   General bed mobility comments: assist for trunk and BLE, increased physical assist as pt stops assisting midway, pt trying to self-direcct session    Transfers Overall transfer level: Needs assistance Equipment used: Rolling walker (2 wheels) Transfers: Sit to/from Stand, Bed to chair/wheelchair/BSC Sit to Stand: Max assist, +2 physical assistance     Step pivot transfers: Mod assist, +2 physical assistance     General transfer comment: pt attempting to stand with feet placed anteriorly, and not flat on ground. requires max cues for redirection as pt begins insisting just pick me up vs pt putting forth effort. requires step by step cues for BOS correction and hand placement, pt dismissive of education and pulls back on RW. able to take small pivotal steps back to bed, requires max cues for redirection as pt attempting multiple times to sit spontaneously      Balance Overall balance assessment: Needs assistance Sitting-balance support: No upper extremity supported Sitting balance-Leahy Scale: Fair Sitting balance - Comments: R lateral lean to offset L hip   Standing balance support: Bilateral upper extremity supported Standing balance-Leahy Scale: Fair Standing balance comment: poor safety awareness,lacks judgement and insight  ADL either performed or assessed with clinical judgement   ADL Overall ADL's : Needs assistance/impaired Eating/Feeding: Independent;Sitting                                      General ADL Comments: Session focused on functional mobility. MS in for assist during session.     Vision Baseline Vision/History: 2 Legally blind;3 Glaucoma Additional Comments: legally blind R eye            Pertinent Vitals/Pain Pain Assessment Pain Assessment: Faces Faces Pain Scale: Hurts even more Pain Location: L hip Pain Descriptors / Indicators: Aching, Throbbing Pain Intervention(s): Limited activity within patient's tolerance, Monitored during session, Repositioned     Extremity/Trunk Assessment Upper Extremity Assessment Upper Extremity Assessment: Right hand dominant;Generalized weakness   Lower Extremity Assessment Lower Extremity Assessment: Defer to PT evaluation LLE Deficits / Details: Hip fracture   Cervical / Trunk Assessment Cervical / Trunk Assessment: Normal   Communication Communication Communication: No apparent difficulties   Cognition Arousal: Alert Behavior During Therapy: WFL for tasks assessed/performed, Agitated Cognition: No apparent impairments             OT - Cognition Comments: pt particular, self-directs session but is unsafe doing so and lacks insight                 Following commands: Intact       Cueing  General Comments   Cueing Techniques: Verbal cues (follows commands when she choses to)  VSS on 2L O2 (O2 removed end of session per pt request with RN made aware), unable to obtain accurate pleth, pt does not use O2 at home. Honeycomb dressing intact with no strikethrough   Exercises Other Exercises Other Exercises: continued edu on posterior hip precautions, anticipate pt will need max cuing and reinforcement to adhere   Shoulder Instructions      Home Living Family/patient expects to be discharged to:: Private residence Living Arrangements: Alone Available Help at Discharge: Family;Available PRN/intermittently Type of Home: House Home Access: Stairs to enter;Ramped entrance Entrance  Stairs-Number of Steps: 2 Entrance Stairs-Rails: Right;Left;Can reach both Home Layout: One level     Bathroom Shower/Tub: Walk-in shower;Tub/shower unit   Bathroom Toilet: Handicapped height     Home Equipment: Agricultural Consultant (2 wheels);Cane - single point;Shower seat - built in          Prior Functioning/Environment Prior Level of Function : Independent/Modified Independent             Mobility Comments: mod independent using SPC ADLs Comments: independent in ADLs, uses SPC, fell while walking to bathroom barefoot    OT Problem List: Decreased strength;Decreased activity tolerance;Impaired balance (sitting and/or standing);Pain;Decreased knowledge of precautions;Decreased knowledge of use of DME or AE;Decreased safety awareness;Decreased cognition;Decreased coordination   OT Treatment/Interventions: Self-care/ADL training;Therapeutic exercise;Energy conservation;DME and/or AE instruction;Therapeutic activities;Patient/family education;Balance training      OT Goals(Current goals can be found in the care plan section)   Acute Rehab OT Goals OT Goal Formulation: With patient Time For Goal Achievement: 06/01/24 Potential to Achieve Goals: Good ADL Goals Pt Will Perform Grooming: with min assist;sitting Pt Will Perform Upper Body Dressing: with supervision;sitting Pt Will Perform Lower Body Dressing: with min assist;sit to/from stand;with adaptive equipment Pt Will Transfer to Toilet: with min assist;bedside commode;ambulating;grab bars Pt Will Perform Toileting - Clothing Manipulation and hygiene: with adaptive equipment;with min assist;sitting/lateral leans;with caregiver independent in assisting  OT Frequency:  Min 2X/week    Co-evaluation PT/OT/SLP Co-Evaluation/Treatment: Yes Reason for Co-Treatment: Complexity of the patient's impairments (multi-system involvement);Necessary to address cognition/behavior during functional activity;For patient/therapist  safety;To address functional/ADL transfers PT goals addressed during session: Mobility/safety with mobility;Balance;Proper use of DME;Strengthening/ROM OT goals addressed during session: ADL's and self-care;Proper use of Adaptive equipment and DME;Strengthening/ROM      AM-PAC OT 6 Clicks Daily Activity     Outcome Measure Help from another person eating meals?: None Help from another person taking care of personal grooming?: A Little Help from another person toileting, which includes using toliet, bedpan, or urinal?: Total Help from another person bathing (including washing, rinsing, drying)?: Total Help from another person to put on and taking off regular upper body clothing?: A Lot Help from another person to put on and taking off regular lower body clothing?: Total 6 Click Score: 12   End of Session Equipment Utilized During Treatment: Gait belt;Rolling walker (2 wheels) Nurse Communication: Mobility status;Patient requests pain meds;Precautions;Weight bearing status  Activity Tolerance: Patient tolerated treatment well Patient left: in chair;with call bell/phone within reach;with chair alarm set  OT Visit Diagnosis: Unsteadiness on feet (R26.81);Other abnormalities of gait and mobility (R26.89);Muscle weakness (generalized) (M62.81);Pain;History of falling (Z91.81) Pain - Right/Left: Left Pain - part of body: Hip                Time: 1341-1355 OT Time Calculation (min): 14 min Charges:  OT General Charges $OT Visit: 1 Visit $Therapeutic Activity: 8-22 mins Bran Aldridge L. Melquan Ernsberger, OTR/L  05/18/2024, 4:28 PM

## 2024-05-18 NOTE — Plan of Care (Signed)
" °  Problem: Education: Goal: Ability to describe self-care measures that may prevent or decrease complications (Diabetes Survival Skills Education) will improve Outcome: Progressing Goal: Individualized Educational Video(s) Outcome: Progressing   Problem: Coping: Goal: Ability to adjust to condition or change in health will improve Outcome: Progressing   Problem: Fluid Volume: Goal: Ability to maintain a balanced intake and output will improve Outcome: Progressing   Problem: Health Behavior/Discharge Planning: Goal: Ability to identify and utilize available resources and services will improve Outcome: Progressing Goal: Ability to manage health-related needs will improve Outcome: Progressing   Problem: Metabolic: Goal: Ability to maintain appropriate glucose levels will improve Outcome: Progressing   Problem: Nutritional: Goal: Maintenance of adequate nutrition will improve Outcome: Progressing Goal: Progress toward achieving an optimal weight will improve Outcome: Progressing   Problem: Skin Integrity: Goal: Risk for impaired skin integrity will decrease Outcome: Progressing   Problem: Tissue Perfusion: Goal: Adequacy of tissue perfusion will improve Outcome: Progressing   Problem: Education: Goal: Knowledge of General Education information will improve Description: Including pain rating scale, medication(s)/side effects and non-pharmacologic comfort measures Outcome: Progressing   Problem: Health Behavior/Discharge Planning: Goal: Ability to manage health-related needs will improve Outcome: Progressing   Problem: Clinical Measurements: Goal: Ability to maintain clinical measurements within normal limits will improve Outcome: Progressing Goal: Will remain free from infection Outcome: Progressing Goal: Diagnostic test results will improve Outcome: Progressing Goal: Respiratory complications will improve Outcome: Progressing Goal: Cardiovascular complication will  be avoided Outcome: Progressing   Problem: Coping: Goal: Level of anxiety will decrease Outcome: Progressing   Problem: Nutrition: Goal: Adequate nutrition will be maintained Outcome: Progressing   Problem: Activity: Goal: Risk for activity intolerance will decrease Outcome: Progressing   Problem: Elimination: Goal: Will not experience complications related to bowel motility Outcome: Progressing Goal: Will not experience complications related to urinary retention Outcome: Progressing   "

## 2024-05-18 NOTE — Progress Notes (Addendum)
 " Subjective: 1 Day Post-Op Procedures (LRB): HEMIARTHROPLASTY (BIPOLAR) HIP, POSTERIOR APPROACH FOR FRACTURE (Left) Patient reports pain as mild in the left hip. Patient is well, and has had no acute complaints or problems She did have to be reminded that she had surgery yesterday but is otherwise clear and coherent. PT and care management to assist with d/c planning. Negative for chest pain and shortness of breath Fever: no Gastrointestinal:Negative for nausea and vomiting  Objective: Vital signs in last 24 hours: Temp:  [97.6 F (36.4 C)-99.2 F (37.3 C)] 98.4 F (36.9 C) (12/24 0729) Pulse Rate:  [50-74] 59 (12/24 0729) Resp:  [11-18] 18 (12/24 0729) BP: (136-187)/(59-98) 166/82 (12/24 0729) SpO2:  [85 %-100 %] 100 % (12/24 0729)  Intake/Output from previous day:  Intake/Output Summary (Last 24 hours) at 05/18/2024 0738 Last data filed at 05/17/2024 1949 Gross per 24 hour  Intake 500 ml  Output 500 ml  Net 0 ml    Intake/Output this shift: No intake/output data recorded.  Labs: Recent Labs    05/16/24 2331 05/17/24 0529 05/18/24 0437  HGB 9.8* 10.3* 9.3*   Recent Labs    05/17/24 0529 05/18/24 0437  WBC 10.5 13.5*  RBC 3.66* 3.31*  HCT 31.7* 29.0*  PLT 305 278   Recent Labs    05/17/24 0529 05/18/24 0437  NA 138 138  K 3.1* 3.9  CL 104 105  CO2 22 19*  BUN 9 14  CREATININE 0.64 0.82  GLUCOSE 119* 256*  CALCIUM  8.6* 8.0*   No results for input(s): LABPT, INR in the last 72 hours.   EXAM General - Patient is Alert and Appropriate Extremity - ABD soft Neurovascular intact Dorsiflexion/Plantar flexion intact Incision: dressing C/D/I No cellulitis present Compartment soft Dressing/Incision - clean, dry, no drainage noted to the left hip honeycomb dressing. Motor Function - intact, moving foot well on exam, toes have been amputated in the past. Abdomen soft with intact bowel sounds this AM.  Past Medical History:  Diagnosis Date    Blindness of right eye    CHF (congestive heart failure) (HCC)    Depression    Diabetes mellitus without complication (HCC)    DVT of leg (deep venous thrombosis) (HCC) 2011   Glaucoma    Grade II diastolic dysfunction    Hyperlipidemia    Hypertension    Vascular disease     Assessment/Plan: 1 Day Post-Op Procedures (LRB): HEMIARTHROPLASTY (BIPOLAR) HIP, POSTERIOR APPROACH FOR FRACTURE (Left) Principal Problem:   Left displaced femoral neck fracture (HCC) Active Problems:   Hyperlipidemia   Diabetes mellitus without complication (HCC)   Hypokalemia   Grade II diastolic dysfunction   Depression  Estimated body mass index is 20.02 kg/m as calculated from the following:   Height as of this encounter: 5' 4 (1.626 m).   Weight as of this encounter: 52.9 kg. Advance diet Up with therapy D/C IV fluids when tolerating po intake.  Labs and vitals reviewed, WBC 13.5, Hg 9.3. Patient is incontinent to bowel and bladder. Up with therapy today. Restart Plavix  today. May need SNF at discharge.  DVT Prophylaxis - TED hose and Plavix  Weight-Bearing as tolerated to left leg  J. Gustavo Level, PA-C [facility] Orthopaedic Surgery 05/18/2024, 7:38 AM   ADDENDUM: The patient has been refusing insulin  due to concern of the needle. I discussed with her the importance of tight glycemic control, especially during the immediate postoperative period, to help prevent the risk of infection. We dicussed the morbidity of developing  an infection, which could lead to loss of function of the extremity, loss of limb, or even death. Any recs from primary regarding better diabetic control is much appreciated.   Jackquline CANDIE Barrack, MD Orthopaedic Surgery [facility]  "

## 2024-05-18 NOTE — Assessment & Plan Note (Addendum)
 CBG now within goal, A1c of 6.4 - Continue with moderate SSI - Continue with 3 units with meal

## 2024-05-18 NOTE — Evaluation (Signed)
 Physical Therapy Evaluation Patient Details Name: Lisa [name] MRN: [id number] DOB: 03/07/1946 Today's Date: 05/18/2024  History of Present Illness  Lisa [name] is a 78 y.o. female with medical history significant for chronic functional diarrhea, chronic electrolyte imbalance, prior history of DVT not on anticoagulation, hypertension, hyperlipidemia, chronic HFpEF, type 2 diabetes, chronic anxiety/depression, recently admitted (12/6-12/13/25) for acute on chronic HFpEF and electrolyte abnormalities, who presents to the ER after a mechanical fall at home.  Clinical Impression  Patient noted to be in supine position at PT arrival in room, for an initial PT evaluation due to a decline in functional status, with baseline mobility reported as modI with history of falls, and currently requiring minA to stand at bedside and modA x2 for bed mobility. The patient is A&O x 4, presenting with good willingness to work with PT, with discharge expectations that include SNF. The patient resides in a house and lives alone with family/friend support. There are 2 STE inside the residence.  . Recommended skilled PT will address safety, mobility, and discharge planning.        If plan is discharge home, recommend the following: A lot of help with walking and/or transfers;A lot of help with bathing/dressing/bathroom;Help with stairs or ramp for entrance;Assist for transportation   Can travel by private vehicle   Yes    Equipment Recommendations Rolling walker (2 wheels)  Recommendations for Other Services       Functional Status Assessment Patient has had a recent decline in their functional status and demonstrates the ability to make significant improvements in function in a reasonable and predictable amount of time.     Precautions / Restrictions Precautions Precautions: Fall Recall of Precautions/Restrictions: Intact Precaution/Restrictions Comments: L hip  precautions Restrictions Weight Bearing Restrictions Per Provider Order: Yes LLE Weight Bearing Per Provider Order: Weight bearing as tolerated      Mobility  Bed Mobility Overal bed mobility: Needs Assistance Bed Mobility: Rolling, Sidelying to Sit Rolling: Min assist Sidelying to sit: Min assist, Mod assist, +2 for physical assistance       General bed mobility comments: +2 to maintain L hip precautions; pt varrying cooperation throughout    Transfers Overall transfer level: Needs assistance Equipment used: Rolling walker (2 wheels) Transfers: Sit to/from Stand, Bed to chair/wheelchair/BSC Sit to Stand: Min assist Stand pivot transfers: Mod assist         General transfer comment: multiple vc for stepping throughout transfer    Ambulation/Gait                  Stairs            Wheelchair Mobility     Tilt Bed    Modified Rankin (Stroke Patients Only)       Balance Overall balance assessment: Needs assistance Sitting-balance support: No upper extremity supported Sitting balance-Leahy Scale: Good     Standing balance support: Bilateral upper extremity supported Standing balance-Leahy Scale: Fair                               Pertinent Vitals/Pain Pain Assessment Pain Assessment: Faces Faces Pain Scale: Hurts even more Pain Location: L hip Pain Descriptors / Indicators: Aching, Throbbing Pain Intervention(s): Limited activity within patient's tolerance, Monitored during session, Repositioned, Premedicated before session    Home Living Family/patient expects to be discharged to:: Private residence Living Arrangements: Alone Available Help at Discharge: Family;Available PRN/intermittently Type of Home: House Home  Access: Stairs to enter;Ramped entrance Entrance Stairs-Rails: Right;Left;Can reach both Entrance Stairs-Number of Steps: 2   Home Layout: One level Home Equipment: Agricultural Consultant (2 wheels);Cane - single  point;Shower seat - built in      Prior Function Prior Level of Function : Independent/Modified Independent             Mobility Comments: Mod indep with SPC for household mobilization; no home O2; denies fall history. ADLs Comments: IND with ADLs, reports minimal driving, will be selling her car, blind in R eye     Extremity/Trunk Assessment   Upper Extremity Assessment Upper Extremity Assessment: Generalized weakness    Lower Extremity Assessment Lower Extremity Assessment: Generalized weakness;LLE deficits/detail LLE Deficits / Details: Hip fracture    Cervical / Trunk Assessment Cervical / Trunk Assessment: Normal  Communication   Communication Communication: No apparent difficulties    Cognition Arousal: Alert Behavior During Therapy: WFL for tasks assessed/performed, Agitated   PT - Cognitive impairments: No apparent impairments                         Following commands: Intact       Cueing Cueing Techniques: Verbal cues     General Comments      Exercises     Assessment/Plan    PT Assessment Patient needs continued PT services  PT Problem List Decreased strength;Decreased range of motion;Decreased activity tolerance;Decreased balance;Decreased mobility;Decreased knowledge of use of DME;Decreased safety awareness;Decreased knowledge of precautions;Cardiopulmonary status limiting activity       PT Treatment Interventions DME instruction;Gait training;Stair training;Functional mobility training;Therapeutic activities;Therapeutic exercise;Balance training;Patient/family education;Cognitive remediation    PT Goals (Current goals can be found in the Care Plan section)  Acute Rehab PT Goals Patient Stated Goal: to get better PT Goal Formulation: With patient Time For Goal Achievement: 06/13/24 Potential to Achieve Goals: Good    Frequency Min 2X/week     Co-evaluation   Reason for Co-Treatment: Complexity of the patient's impairments  (multi-system involvement);Necessary to address cognition/behavior during functional activity;For patient/therapist safety;To address functional/ADL transfers PT goals addressed during session: Mobility/safety with mobility;Balance;Proper use of DME;Strengthening/ROM OT goals addressed during session: ADL's and self-care;Proper use of Adaptive equipment and DME;Strengthening/ROM       AM-PAC PT 6 Clicks Mobility  Outcome Measure Help needed turning from your back to your side while in a flat bed without using bedrails?: None Help needed moving from lying on your back to sitting on the side of a flat bed without using bedrails?: None Help needed moving to and from a bed to a chair (including a wheelchair)?: A Little Help needed standing up from a chair using your arms (e.g., wheelchair or bedside chair)?: A Little Help needed to walk in hospital room?: A Little Help needed climbing 3-5 steps with a railing? : A Lot 6 Click Score: 19    End of Session Equipment Utilized During Treatment: Gait belt Activity Tolerance: Patient tolerated treatment well Patient left: in chair;with call bell/phone within reach;with chair alarm set Nurse Communication: Mobility status PT Visit Diagnosis: Muscle weakness (generalized) (M62.81);Difficulty in walking, not elsewhere classified (R26.2)    Time: 8949-8880 PT Time Calculation (min) (ACUTE ONLY): 29 min   Charges:   PT Evaluation $PT Eval Low Complexity: 1 Low   PT General Charges $$ ACUTE PT VISIT: 1 Visit         Sherlean Lesches DPT, PT    Raelie Lohr A Gaudencio Chesnut 05/18/2024, 12:19 PM

## 2024-05-18 NOTE — Evaluation (Signed)
 Occupational Therapy Evaluation Patient Details Name: Lisa [name] MRN: [id number] DOB: [birth year] Today's Date: 05/18/2024   History of Present Illness   Pt is a 78 y.o. female s/p L hip hemiarthroplasty on 12/23 secondary to mechanical fall. PMH significant for HTN, DVT not on anticoagulation, chronic functional diarrhea, L TMA, R eye blindness, HLD, CHF, Type II DM, recently admitted (12/6-12/13/25) for acute on chronic HFpEF and electrolyte abnormalities.     Clinical Impressions Pt admitted with above. Prior to admission, pt lives alone and uses [facility] for mobility. Pt reports falling while ambulating to bathroom barefoot. Pt received pain medication prior to OT/PT evaluations, requires MOD A +2 for bed mobility, pt prefers to self-direct session and often requires redirection for safety and adherence to posterior hip precautions. Pt requires MIN A +2 for STS using RW, step pivot to recliner with MOD A +2, poor safety awareness and decreased insight noted. Anticipate pt will require MAX A for LB ADLs and continue to require +2 for safe OOB mobility efforts. Of note, pt recently discharged home alone 05/07/24 with OT recommending SNF. Highly recommend and encourage pt discharge to STR vs home as she does not have any support available and is currently a +2 assist. Pt is a high risk for falls and unsafe to return home alone. Pt would benefit from skilled OT services to address noted impairments and functional limitations (see below for any additional details) in order to maximize safety and independence while minimizing falls risk and caregiver burden. Anticipate the need for follow up OT services upon acute hospital DC. Patient will benefit from continued inpatient follow up therapy, <3 hours/day      If plan is discharge home, recommend the following:   Two people to help with walking and/or transfers;Two people to help with bathing/dressing/bathroom;Help with stairs or ramp for  entrance     Functional Status Assessment   Patient has had a recent decline in their functional status and demonstrates the ability to make significant improvements in function in a reasonable and predictable amount of time.     Equipment Recommendations   BSC/3in1      Precautions/Restrictions   Precautions Precautions: Fall Recall of Precautions/Restrictions: Impaired Precaution/Restrictions Comments: per secure chat with MD Dalton posterior hip precautions, WBAT (hx of L TMA) Restrictions Weight Bearing Restrictions Per Provider Order: Yes LLE Weight Bearing Per Provider Order: Weight bearing as tolerated Other Position/Activity Restrictions: posterior hip     Mobility Bed Mobility Overal bed mobility: Needs Assistance Bed Mobility: Rolling, Sidelying to Sit Rolling: Min assist Sidelying to sit: Min assist, Mod assist, +2 for physical assistance       General bed mobility comments: max encourgement to participate, pt self-directs session and is generally dismissive of PT/OT attempts to educate and provide adequate assistance for posterior hip precautions. Requires more assistance due to  behaviors and need for constant redirection.    Transfers Overall transfer level: Needs assistance Equipment used: Rolling walker (2 wheels) Transfers: Sit to/from Stand, Bed to chair/wheelchair/BSC Sit to Stand: Min assist, +2 physical assistance     Step pivot transfers: Mod assist, +2 physical assistance     General transfer comment: max multimodal cues for safe transfer technique and use of RW. Pt requires cues for hand placement, often attempting to take both hands off RW to adjust purewick. Constant redirection      Balance Overall balance assessment: Needs assistance Sitting-balance support: No upper extremity supported Sitting balance-Leahy Scale: Fair Sitting balance - Comments: R  lateral lean to offset L hip   Standing balance support: Bilateral upper  extremity supported Standing balance-Leahy Scale: Fair Standing balance comment: poor safety awareness, pt attempting to take both hands off RW to adjust purewick                           ADL either performed or assessed with clinical judgement   ADL Overall ADL's : Needs assistance/impaired Eating/Feeding: Independent;Sitting                                           Vision Baseline Vision/History: 2 Legally blind;3 Glaucoma Additional Comments: legally blind R eye            Pertinent Vitals/Pain Pain Assessment Pain Assessment: Faces Faces Pain Scale: Hurts even more Pain Location: L hip Pain Descriptors / Indicators: Aching, Throbbing Pain Intervention(s): Limited activity within patient's tolerance, Monitored during session, Premedicated before session     Extremity/Trunk Assessment Upper Extremity Assessment Upper Extremity Assessment: Right hand dominant;Generalized weakness   Lower Extremity Assessment Lower Extremity Assessment: Defer to PT evaluation LLE Deficits / Details: Hip fracture   Cervical / Trunk Assessment Cervical / Trunk Assessment: Normal   Communication Communication Communication: No apparent difficulties   Cognition Arousal: Alert Behavior During Therapy: WFL for tasks assessed/performed, Agitated Cognition: No apparent impairments             OT - Cognition Comments: pt particular, self-directs session but is unsafe doing so and lacks insight                 Following commands: Intact       Cueing  General Comments   Cueing Techniques: Verbal cues  VSS on 2L O2 (O2 removed end of session per pt request with RN made aware), unable to obtain accurate pleth, pt does not use O2 at home. Honeycomb dressing intact with no strikethrough   Exercises Other Exercises Other Exercises: edu on posterior hip precautions, role of acute OT and discharge recommendations. pt continues to insist on returning  home.   Shoulder Instructions      Home Living Family/patient expects to be discharged to:: Private residence Living Arrangements: Alone Available Help at Discharge: Family;Available PRN/intermittently Type of Home: House Home Access: Stairs to enter;Ramped entrance Entrance Stairs-Number of Steps: 2 Entrance Stairs-Rails: Right;Left;Can reach both Home Layout: One level     Bathroom Shower/Tub: Walk-in shower;Tub/shower unit   Bathroom Toilet: Handicapped height     Home Equipment: Agricultural Consultant (2 wheels);Cane - single point;Shower seat - built in          Prior Functioning/Environment Prior Level of Function : Independent/Modified Independent             Mobility Comments: mod independent using SPC ADLs Comments: independent in ADLs, uses SPC, fell while walking to bathroom barefoot    OT Problem List: Decreased strength;Decreased activity tolerance;Impaired balance (sitting and/or standing);Pain;Decreased knowledge of precautions;Decreased knowledge of use of DME or AE;Decreased safety awareness;Decreased cognition;Decreased coordination   OT Treatment/Interventions: Self-care/ADL training;Therapeutic exercise;Energy conservation;DME and/or AE instruction;Therapeutic activities;Patient/family education;Balance training      OT Goals(Current goals can be found in the care plan section)   Acute Rehab OT Goals OT Goal Formulation: With patient Time For Goal Achievement: 06/01/24 Potential to Achieve Goals: Good   OT Frequency:  Min 2X/week  Co-evaluation PT/OT/SLP Co-Evaluation/Treatment: Yes Reason for Co-Treatment: Complexity of the patient's impairments (multi-system involvement);Necessary to address cognition/behavior during functional activity;For patient/therapist safety;To address functional/ADL transfers PT goals addressed during session: Mobility/safety with mobility;Balance;Proper use of DME;Strengthening/ROM OT goals addressed during session:  ADL's and self-care;Proper use of Adaptive equipment and DME;Strengthening/ROM      AM-PAC OT 6 Clicks Daily Activity     Outcome Measure Help from another person eating meals?: None Help from another person taking care of personal grooming?: A Little Help from another person toileting, which includes using toliet, bedpan, or urinal?: Total Help from another person bathing (including washing, rinsing, drying)?: Total Help from another person to put on and taking off regular upper body clothing?: A Lot Help from another person to put on and taking off regular lower body clothing?: Total 6 Click Score: 12   End of Session Equipment Utilized During Treatment: Rolling walker (2 wheels);Oxygen;Gait belt Nurse Communication: Mobility status;Patient requests pain meds;Precautions;Weight bearing status  Activity Tolerance: Patient tolerated treatment well Patient left: in chair;with call bell/phone within reach;with chair alarm set  OT Visit Diagnosis: Unsteadiness on feet (R26.81);Other abnormalities of gait and mobility (R26.89);Muscle weakness (generalized) (M62.81);Pain;History of falling (Z91.81) Pain - Right/Left: Left Pain - part of body: Hip                Time: 8954-8880 OT Time Calculation (min): 34 min Charges:  OT General Charges $OT Visit: 1 Visit OT Evaluation $OT Eval Low Complexity: 1 Low OT Treatments $Self Care/Home Management : 8-22 mins  Marialena Wollen L. Mithran Strike, OTR/L  05/18/2024, 2:19 PM

## 2024-05-18 NOTE — Progress Notes (Signed)
" °  Progress Note   Patient: Lisa [name] FMW:[id number] DOB: 07-13-1945 DOA: 05/16/2024     1 DOS: the patient was seen and examined on 05/18/2024   Brief hospital course: Partly taken from H&P.  Lasheka Amoria Mclees is a 78 y.o. female with medical history significant for chronic functional diarrhea, chronic electrolyte imbalance, prior history of DVT not on anticoagulation, hypertension, hyperlipidemia, chronic HFpEF, type 2 diabetes, chronic anxiety/depression, recently admitted (12/6-12/13/25) for acute on chronic HFpEF and electrolyte abnormalities, who presents to the ER after a mechanical fall at home.   On presentation hemodynamically stable, imaging with concern of left displaced femoral neck fracture, case was discussed with orthopedic surgery and they advised admission under TRH and patient will be taken to the OR during the day.  12/23: Blood pressure mildly elevated at 171/73, leukocytosis improved, magnesium  at 1.7, potassium 3.1-replating magnesium  and potassium. Patient was taken to the OR s/p left hemiarthroplasty-tolerated the procedure well.  Assessment and Plan: * Left displaced femoral neck fracture (HCC) Secondary to mechanical fall s/p hemiarthroplasty on 12/23 -Continue with pain management -PT and OT recommending SNF -TOC consult for placement  Hypokalemia Resolved with repletion. -Continue to monitor  Acute postoperative anemia due to expected blood loss Hemoglobin decreased to 9.3 after hip fracture and surgery. -Starting on supplement  Grade II diastolic dysfunction Last 2D echo done on 02/26/2021 revealed LVEF 60 to 65% with grade 2 diastolic dysfunction.  Clinically appears euvolemic -Strict intake and output -Daily weight  Hyperlipidemia - Continue home statin  Diabetes mellitus without complication (HCC) CBG elevated above 200, A1c of 6.4 - Changing to moderate SSI -Adding 3 units with meal  Depression - Continue home  meds   Subjective: Patient was sitting in chair when seen today.  Pain was bearable.  Wants to go to [facility] common for rehab.  Physical Exam: Vitals:   05/18/24 0413 05/18/24 0729 05/18/24 0836 05/18/24 1417  BP: (!) 163/68 (!) 166/82  128/63  Pulse: 60 (!) 59 63 (!) 59  Resp: 16 18  18   Temp: 98.4 F (36.9 C) 98.4 F (36.9 C)  98.3 F (36.8 C)  TempSrc:      SpO2: (!) 85% 100%  100%  Weight:      Height:       General.  Frail and malnourished elderly lady, in no acute distress. Pulmonary.  Lungs clear bilaterally, normal respiratory effort. CV.  Regular rate and rhythm, no JVD, rub or murmur. Abdomen.  Soft, nontender, nondistended, BS positive. CNS.  Alert and oriented .  No focal neurologic deficit. Extremities.  No edema, pulses intact and symmetrical. Psychiatry.  Judgment and insight appears normal.   Data Reviewed: Prior data reviewed  Family Communication: Called brother with no response  Disposition: Status is: Inpatient Remains inpatient appropriate because: Severity of illness  Planned Discharge Destination: SNF  DVT prophylaxis.  SCDs-will start Lovenox  from tomorrow Time spent: 45 minutes  This record has been created using Conservation officer, historic buildings. Errors have been sought and corrected,but may not always be located. Such creation errors do not reflect on the standard of care.   Author: Amaryllis Dare, MD 05/18/2024 4:35 PM  For on call review [email or web address].  "

## 2024-05-18 NOTE — Assessment & Plan Note (Signed)
 Secondary to mechanical fall s/p hemiarthroplasty on 12/23 -Continue with pain management -PT and OT recommending SNF -[facility] consult for placement

## 2024-05-18 NOTE — Assessment & Plan Note (Signed)
 Hemoglobin decreased to 9.3>>7.7>>8.6 after hip fracture and surgery. - Continue with iron supplement -Monitor hemoglobin -Transfuse if below 7

## 2024-05-19 DIAGNOSIS — D62 Acute posthemorrhagic anemia: Secondary | ICD-10-CM | POA: Diagnosis not present

## 2024-05-19 DIAGNOSIS — I5189 Other ill-defined heart diseases: Secondary | ICD-10-CM | POA: Diagnosis not present

## 2024-05-19 DIAGNOSIS — E785 Hyperlipidemia, unspecified: Secondary | ICD-10-CM | POA: Diagnosis not present

## 2024-05-19 DIAGNOSIS — E119 Type 2 diabetes mellitus without complications: Secondary | ICD-10-CM | POA: Diagnosis not present

## 2024-05-19 DIAGNOSIS — F32A Depression, unspecified: Secondary | ICD-10-CM | POA: Diagnosis not present

## 2024-05-19 DIAGNOSIS — E876 Hypokalemia: Secondary | ICD-10-CM | POA: Diagnosis not present

## 2024-05-19 DIAGNOSIS — S72002A Fracture of unspecified part of neck of left femur, initial encounter for closed fracture: Secondary | ICD-10-CM | POA: Diagnosis not present

## 2024-05-19 LAB — CBC
HCT: 23 % — ABNORMAL LOW (ref 36.0–46.0)
Hemoglobin: 7.7 g/dL — ABNORMAL LOW (ref 12.0–15.0)
MCH: 28.5 pg (ref 26.0–34.0)
MCHC: 33.5 g/dL (ref 30.0–36.0)
MCV: 85.2 fL (ref 80.0–100.0)
Platelets: 225 K/uL (ref 150–400)
RBC: 2.7 MIL/uL — ABNORMAL LOW (ref 3.87–5.11)
RDW: 21 % — ABNORMAL HIGH (ref 11.5–15.5)
WBC: 7.8 K/uL (ref 4.0–10.5)
nRBC: 0 % (ref 0.0–0.2)

## 2024-05-19 LAB — GLUCOSE, CAPILLARY
Glucose-Capillary: 170 mg/dL — ABNORMAL HIGH (ref 70–99)
Glucose-Capillary: 143 mg/dL — ABNORMAL HIGH (ref 70–99)
Glucose-Capillary: 175 mg/dL — ABNORMAL HIGH (ref 70–99)
Glucose-Capillary: 169 mg/dL — ABNORMAL HIGH (ref 70–99)

## 2024-05-19 MED ORDER — OXYCODONE HCL 5 MG PO TABS: 5.0000 mg | ORAL_TABLET | ORAL | 0 refills | Status: AC | PRN

## 2024-05-19 NOTE — TOC Initial Note (Signed)
 Transition of Care [facility]) - Initial/Assessment Note    Patient Details  Name: Lisa [name] MRN: [id number] Date of Birth: 1945/12/13  Transition of Care [facility]) CM/SW Contact:    Victory Jackquline RAMAN, RN Phone Number: 05/19/2024, 11:02 AM  Clinical Narrative:                  RNCM met with patient at bedside. RNCM Introduced myself and my role and explained that discharge planning would be discussed. PT is recommending STR. Patient is in agreement with STR and would like to go to [facility]. 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 Her sister-in-law takes her to her appointments, no issues with getting her medications. Referral's submitted for bed offers, FL2 completed. RNCM will continue to follow for discharge planning needs.    Expected Discharge Plan: Skilled Nursing Facility Barriers to Discharge: Continued Medical Work up   Patient Goals and CMS Choice            Expected Discharge Plan and Services       Living arrangements for the past 2 months: Single Family Home                                      Prior Living Arrangements/Services Living arrangements for the past 2 months: Single Family Home Lives with:: Self Patient language and need for interpreter reviewed:: Yes Do you feel safe going back to the place where you live?: Yes      Need for Family Participation in Patient Care: Yes (Comment) Care giver support system in place?: Yes (comment)   Criminal Activity/Legal Involvement Pertinent to Current Situation/Hospitalization: No - Comment as needed  Activities of Daily Living   ADL Screening (condition at time of admission) Independently performs ADLs?: No Does the patient have a NEW difficulty with bathing/dressing/toileting/self-feeding that is expected to last >3 days?: Yes (Initiates electronic notice to provider for possible OT consult) Does the patient have a NEW  difficulty with getting in/out of bed, walking, or climbing stairs that is expected to last >3 days?: Yes (Initiates electronic notice to provider for possible PT consult) Does the patient have a NEW difficulty with communication that is expected to last >3 days?: No Is the patient deaf or have difficulty hearing?: Yes Does the patient have difficulty seeing, even when wearing glasses/contacts?: No Does the patient have difficulty concentrating, remembering, or making decisions?: No  Permission Sought/Granted                  Emotional Assessment Appearance:: Appears stated age Attitude/Demeanor/Rapport: Gracious, Self-Confident, Engaged Affect (typically observed): Pleasant, Quiet, Adaptable Orientation: : Oriented to Self, Oriented to Place, Oriented to  Time, Oriented to Situation Alcohol  / Substance Use: Not Applicable Psych Involvement: No (comment)  Admission diagnosis:  Left displaced femoral neck fracture (HCC) [S72.002A] Patient Active Problem List   Diagnosis Date Noted   Acute postoperative anemia due to expected blood loss 05/18/2024   Left displaced femoral neck fracture (HCC) 05/17/2024   Grade II diastolic dysfunction    Depression    Acute on chronic diastolic CHF (congestive heart failure) (HCC) 05/03/2024   Fever 05/02/2024   Blurry vision, left eye 05/02/2024   Generalized weakness 05/02/2024   Shortness of breath 05/02/2024   Potassium-sensitive periodic paralysis 05/01/2024   Uncontrolled type 2 diabetes  mellitus with hypoglycemia, without long-term current use of insulin  (HCC) 05/01/2024   Mediastinal mass 05/01/2024   Hypocalcemia 05/01/2024   Hypokalemia 04/30/2024   Diarrhea 03/24/2024   Urinary incontinence 03/23/2024   Acute hypokalemia and hypomagnesemia secondary to diarrhea 03/22/2024   Hypomagnesemia 03/22/2024   Chronic diarrhea of unknown origin 03/22/2024   History of DVT (deep vein thrombosis) 03/22/2024   Anemia 03/22/2024   Sinus  bradycardia 03/22/2024   Hypertensive urgency 04/08/2023   Malnutrition of moderate degree 04/08/2023   Unilateral amputation of left foot (HCC) 02/19/2022   Hypertension 02/19/2022   Nicotine  dependence 04/09/2021   Syncope and collapse 02/26/2021   Prolonged QT interval 02/26/2021   DM (diabetes mellitus), type 2 with peripheral vascular complications (HCC) 09/25/2015   Amputation of left foot (HCC) 12/02/2014   Benign essential HTN 12/02/2014   Depression, major, recurrent, mild (HCC) 12/02/2014   Hyperlipidemia 12/02/2014   Vitamin B12 deficiency 12/02/2014   Peripheral neuropathic pain 12/02/2014   Atherosclerosis of native arteries of extremity with rest pain (HCC) 12/02/2014   Phantom limb (HCC) 12/02/2014   Diabetes mellitus without complication (HCC) 12/02/2014   Detached retina 12/02/2014   Tobacco abuse 12/02/2014   Urge incontinence 12/02/2014   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   Vitamin D  deficiency 12/02/2014   PCP:  Sowles, Krichna, MD Pharmacy:   [id number] [address],  - [address] [place] [address] Phone: [phone] Fax: [phone]  [facility] [address] [place] [address] Phone: [phone] Fax: [phone]     Social Drivers of Health (SDOH) Social History: SDOH Screenings   Food Insecurity: No Food Insecurity (05/17/2024)  Housing: High Risk (05/17/2024)  Transportation Needs: Unmet Transportation Needs (05/17/2024)  Utilities: At Risk (05/17/2024)  Alcohol  Screen: Low Risk (02/23/2023)  Depression (PHQ2-9): Low Risk (02/02/2024)  Financial Resource Strain: Medium Risk (04/08/2023)  Social Connections: Unknown (05/17/2024)  Recent Concern: Social Connections - Socially Isolated (03/23/2024)  Tobacco Use: High Risk (05/16/2024)   SDOH Interventions: Housing Interventions: Inpatient TOC, Other (Comment) (resources added to AVS) Transportation Interventions:  Inpatient TOC, Other (Comment) (resources added to AVS) Utilities Interventions: Inpatient TOC, Other (Comment) (Resources added to AVS)   Readmission Risk Interventions     No data to display

## 2024-05-19 NOTE — Anesthesia Postprocedure Evaluation (Signed)
"   Anesthesia Post Note  Patient: Lisa [name]  Procedure(s) Performed: HEMIARTHROPLASTY (BIPOLAR) HIP, POSTERIOR APPROACH FOR FRACTURE (Left)  Patient location during evaluation: PACU Anesthesia Type: General Level of consciousness: awake and alert, oriented and patient cooperative Pain management: pain level controlled Vital Signs Assessment: post-procedure vital signs reviewed and stable Respiratory status: spontaneous breathing, nonlabored ventilation and respiratory function stable Cardiovascular status: blood pressure returned to baseline and stable Postop Assessment: adequate PO intake Anesthetic complications: no   No notable events documented.   Last Vitals:  Vitals:   05/18/24 1953 05/19/24 0411  BP: (!) 122/58 (!) 145/73  Pulse: 63 63  Resp: 19 18  Temp: 37.2 C 37.5 C  SpO2: 95% 100%    Last Pain:  Vitals:   05/19/24 0411  TempSrc: Oral  PainSc:                  Alfonso Ruths      "

## 2024-05-19 NOTE — Progress Notes (Signed)
 " Progress Note   Patient: Lisa [name] FMW:[id number] DOB: [birth year] DOA: 05/16/2024     2 DOS: the patient was seen and examined on 05/19/2024   Brief hospital course: Partly taken from H&P.  Dorothyann Keitha Kolk is a 78 y.o. female with medical history significant for chronic functional diarrhea, chronic electrolyte imbalance, prior history of DVT not on anticoagulation, hypertension, hyperlipidemia, chronic HFpEF, type 2 diabetes, chronic anxiety/depression, recently admitted (12/6-12/13/25) for acute on chronic HFpEF and electrolyte abnormalities, who presents to the ER after a mechanical fall at home.   On presentation hemodynamically stable, imaging with concern of left displaced femoral neck fracture, case was discussed with orthopedic surgery and they advised admission under TRH and patient will be taken to the OR during the day.  12/23: Blood pressure mildly elevated at 171/73, leukocytosis improved, magnesium  at 1.7, potassium 3.1-replating magnesium  and potassium. Patient was taken to the OR s/p left hemiarthroplasty-tolerated the procedure well.  12/24: Blood pressure mildly elevated, hemoglobin with some decreased to 9.3, some leukocytosis likely reactive.  CBG elevated above 200.  12/25: Patient had 1 episode of low-grade fever at 100.1, no obvious sign of infection or complaint.  Leukocytosis resolved.  Hemoglobin with further decreased to 7.7.  Awaiting SNF placement.  Assessment and Plan: * Left displaced femoral neck fracture (HCC) Secondary to mechanical fall s/p hemiarthroplasty on 12/23 -Continue with pain management -PT and OT recommending SNF -TOC consult for placement  Hypokalemia Resolved with repletion. -Continue to monitor  Acute postoperative anemia due to expected blood loss Hemoglobin decreased to 9.3>>7.7 after hip fracture and surgery. -Starting on supplement -Monitor hemoglobin -Transfuse if below 7  Grade II diastolic dysfunction Last  2D echo done on 02/26/2021 revealed LVEF 60 to 65% with grade 2 diastolic dysfunction.  Clinically appears euvolemic -Strict intake and output -Daily weight  Hyperlipidemia - Continue home statin  Diabetes mellitus without complication (HCC) CBG elevated above 200, A1c of 6.4 - Changing to moderate SSI -Adding 3 units with meal  Depression - Continue home meds   Subjective: Patient was resting comfortably when seen today.  Denies any significant pain.  No upper respiratory or urinary symptoms.  She wants to get some sleep.  Physical Exam: Vitals:   05/18/24 1417 05/18/24 1953 05/19/24 0411 05/19/24 0719  BP: 128/63 (!) 122/58 (!) 145/73 (!) 142/69  Pulse: (!) 59 63 63 62  Resp: 18 19 18 16   Temp: 98.3 F (36.8 C) 98.9 F (37.2 C) 99.5 F (37.5 C) 100.1 F (37.8 C)  TempSrc:   Oral Oral  SpO2: 100% 95% 100% 98%  Weight:      Height:       General.  Malnourished, frail elderly lady, in no acute distress. Pulmonary.  Lungs clear bilaterally, normal respiratory effort. CV.  Regular rate and rhythm, no JVD, rub or murmur. Abdomen.  Soft, nontender, nondistended, BS positive. CNS.  Little somnolent.  No focal neurologic deficit. Extremities.  No edema, pulses intact and symmetrical. Psychiatry.  Judgment and insight appears normal.   Data Reviewed: Prior data reviewed  Family Communication: Talked with brother on phone.  Disposition: Status is: Inpatient Remains inpatient appropriate because: Severity of illness  Planned Discharge Destination: SNF  DVT prophylaxis.  SCDs-will start Lovenox  from tomorrow Time spent: 44 minutes  This record has been created using Conservation officer, historic buildings. Errors have been sought and corrected,but may not always be located. Such creation errors do not reflect on the standard of care.   Author:  Amaryllis Dare, MD 05/19/2024 2:07 PM  For on call review [email or web address].  "

## 2024-05-19 NOTE — Progress Notes (Signed)
" °  Subjective: 2 Days Post-Op Procedures (LRB): HEMIARTHROPLASTY (BIPOLAR) HIP, POSTERIOR APPROACH FOR FRACTURE (Left) Patient reports pain as mild in the left hip this morning. Patient is well, and has had no acute complaints or problems More coherent this AM. Current plan is for d/c to SNF. Negative for chest pain and shortness of breath Fever: no Gastrointestinal:Negative for nausea and vomiting She has had a BM.  Objective: Vital signs in last 24 hours: Temp:  [98.3 F (36.8 C)-100.1 F (37.8 C)] 100.1 F (37.8 C) (12/25 0719) Pulse Rate:  [59-63] 62 (12/25 0719) Resp:  [16-19] 16 (12/25 0719) BP: (122-145)/(58-73) 142/69 (12/25 0719) SpO2:  [95 %-100 %] 98 % (12/25 0719)  Intake/Output from previous day:  Intake/Output Summary (Last 24 hours) at 05/19/2024 0742 Last data filed at 05/18/2024 2100 Gross per 24 hour  Intake 1020 ml  Output 450 ml  Net 570 ml    Intake/Output this shift: No intake/output data recorded.  Labs: Recent Labs    05/16/24 2331 05/17/24 0529 05/18/24 0437  HGB 9.8* 10.3* 9.3*   Recent Labs    05/17/24 0529 05/18/24 0437  WBC 10.5 13.5*  RBC 3.66* 3.31*  HCT 31.7* 29.0*  PLT 305 278   Recent Labs    05/17/24 0529 05/18/24 0437  NA 138 138  K 3.1* 3.9  CL 104 105  CO2 22 19*  BUN 9 14  CREATININE 0.64 0.82  GLUCOSE 119* 256*  CALCIUM  8.6* 8.0*   No results for input(s): LABPT, INR in the last 72 hours.   EXAM General - Patient is Alert and Appropriate Extremity - ABD soft Neurovascular intact Dorsiflexion/Plantar flexion intact Incision: dressing C/D/I No cellulitis present Compartment soft Dressing/Incision - clean, dry, no drainage noted to the left hip honeycomb dressing. Motor Function - intact, moving foot well on exam, toes have been amputated in the past. Abdomen soft with intact bowel sounds this AM.  Past Medical History:  Diagnosis Date   Blindness of right eye    CHF (congestive heart failure)  (HCC)    Depression    Diabetes mellitus without complication (HCC)    DVT of leg (deep venous thrombosis) (HCC) 2011   Glaucoma    Grade II diastolic dysfunction    Hyperlipidemia    Hypertension    Vascular disease     Assessment/Plan: 2 Days Post-Op Procedures (LRB): HEMIARTHROPLASTY (BIPOLAR) HIP, POSTERIOR APPROACH FOR FRACTURE (Left) Principal Problem:   Left displaced femoral neck fracture (HCC) Active Problems:   Hyperlipidemia   Diabetes mellitus without complication (HCC)   Hypokalemia   Grade II diastolic dysfunction   Depression   Acute postoperative anemia due to expected blood loss  Estimated body mass index is 20.02 kg/m as calculated from the following:   Height as of this encounter: 5' 4 (1.626 m).   Weight as of this encounter: 52.9 kg. Advance diet Up with therapy D/C IV fluids when tolerating po intake.  Vitals reviewed this AM. Temp 100.1 this AM, no signs of infection to the left hip. Patient is incontinent to bowel and bladder. Up with therapy today.  Current plan is for d/c to SNF. Patient has had a BM. Continue Plavix .  Following discharge, follow-up with [facility] in 10-14 days for staple removal.  DVT Prophylaxis - TED hose and Plavix  Weight-Bearing as tolerated to left leg  J. Gustavo Level, PA-C [facility] 05/19/2024, 7:42 AM    "

## 2024-05-19 NOTE — Plan of Care (Signed)

## 2024-05-19 NOTE — Progress Notes (Signed)
 Patient refusing insulin . Notified Attending

## 2024-05-19 NOTE — NC FL2 (Signed)
 " [place]  MEDICAID FL2 LEVEL OF CARE FORM     IDENTIFICATION  Patient Name: Lisa [name] Birthdate: 1946/04/26 Sex: female Admission Date (Current Location): 05/16/2024  [facility]: [place] [address] [address] and [place] Number:  Chiropodist and Address:         Provider Number: [id number]  Attending Physician Name and Address:  Caleen Qualia, MD  Relative Name and Phone Number:       Current Level of Care: Hospital Recommended Level of Care: Skilled Nursing Facility Prior Approval Number:    Date Approved/Denied:   PASRR Number:    Discharge Plan:      Current Diagnoses: Patient Active Problem List   Diagnosis Date Noted   Acute postoperative anemia due to expected blood loss 05/18/2024   Left displaced femoral neck fracture (HCC) 05/17/2024   Grade II diastolic dysfunction    Depression    Acute on chronic diastolic CHF (congestive heart failure) (HCC) 05/03/2024   Fever 05/02/2024   Blurry vision, left eye 05/02/2024   Generalized weakness 05/02/2024   Shortness of breath 05/02/2024   Potassium-sensitive periodic paralysis 05/01/2024   Uncontrolled type 2 diabetes mellitus with hypoglycemia, without long-term current use of insulin  (HCC) 05/01/2024   Mediastinal mass 05/01/2024   Hypocalcemia 05/01/2024   Hypokalemia 04/30/2024   Diarrhea 03/24/2024   Urinary incontinence 03/23/2024   Acute hypokalemia and hypomagnesemia secondary to diarrhea 03/22/2024   Hypomagnesemia 03/22/2024   Chronic diarrhea of unknown origin 03/22/2024   History of DVT (deep vein thrombosis) 03/22/2024   Anemia 03/22/2024   Sinus bradycardia 03/22/2024   Hypertensive urgency 04/08/2023   Malnutrition of moderate degree 04/08/2023   Unilateral amputation of left foot (HCC) 02/19/2022   Hypertension 02/19/2022   Nicotine  dependence 04/09/2021   Syncope and collapse 02/26/2021   Prolonged QT interval 02/26/2021   DM (diabetes mellitus), type 2 with peripheral vascular complications (HCC)  09/25/2015   Amputation of left foot (HCC) 12/02/2014   Benign essential HTN 12/02/2014   Depression, major, recurrent, mild (HCC) 12/02/2014   Hyperlipidemia 12/02/2014   Vitamin B12 deficiency 12/02/2014   Peripheral neuropathic pain 12/02/2014   Atherosclerosis of native arteries of extremity with rest pain (HCC) 12/02/2014   Phantom limb (HCC) 12/02/2014   Diabetes mellitus without complication (HCC) 12/02/2014   Detached retina 12/02/2014   Tobacco abuse 12/02/2014   Urge incontinence 12/02/2014   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   Vitamin D  deficiency 12/02/2014    Orientation RESPIRATION BLADDER Height & Weight     Self, Time, Situation  Normal Incontinent Weight: 52.9 kg Height:  5' 4 (162.6 cm)  BEHAVIORAL SYMPTOMS/MOOD NEUROLOGICAL BOWEL NUTRITION STATUS      Continent Diet  AMBULATORY STATUS COMMUNICATION OF NEEDS Skin   Extensive Assist Verbally Surgical wounds                       Personal Care Assistance Level of Assistance  Dressing Bathing Assistance: Limited assistance Feeding assistance: Independent Dressing Assistance: Limited assistance     Functional Limitations Info    Sight Info: Adequate Hearing Info: Adequate Speech Info: Adequate    SPECIAL CARE FACTORS FREQUENCY  PT (By licensed PT), OT (By licensed OT)     PT Frequency: 3X/WEEK OT Frequency: 3X/WEEK            Contractures Contractures Info: Not present    Additional Factors Info  Code Status Code Status Info: Full  Current Medications (05/19/2024):  This is the current hospital active medication list Current Facility-Administered Medications  Medication Dose Route Frequency Provider Last Rate Last Admin   acetaminophen  (TYLENOL ) tablet 500 mg  500 mg Oral Q6H PRN Ezra Jackquline RAMAN, MD   500 mg at 05/19/24 9188   amLODipine  (NORVASC ) tablet 10 mg  10 mg Oral Daily Ezra Jackquline RAMAN, MD   10 mg at 05/19/24 9187   brimonidine  (ALPHAGAN ) 0.2 % ophthalmic solution  1 drop  1 drop Both Eyes BID Amin, Sumayya, MD   1 drop at 05/19/24 0813   citalopram  (CELEXA ) tablet 10 mg  10 mg Oral Daily Dalton, Stewart S, MD   10 mg at 05/19/24 9188   clopidogrel  (PLAVIX ) tablet 75 mg  75 mg Oral Daily Kip Lynwood Double, PA-C   75 mg at 05/19/24 9187   dorzolamide -timolol  (COSOPT ) 2-0.5 % ophthalmic solution 1 drop  1 drop Both Eyes BID Amin, Sumayya, MD   1 drop at 05/19/24 0813   Fe Fum-Vit C-Vit B12-FA (TRIGELS-F FORTE) capsule 1 capsule  1 capsule Oral BID Amin, Sumayya, MD   1 capsule at 05/19/24 0813   fentaNYL  (SUBLIMAZE ) injection 50 mcg  50 mcg Intravenous Q2H PRN Ezra Jackquline RAMAN, MD   50 mcg at 05/17/24 9353   hydrALAZINE  (APRESOLINE ) tablet 25 mg  25 mg Oral Q8H PRN Amin, Sumayya, MD       insulin  aspart (novoLOG ) injection 0-15 Units  0-15 Units Subcutaneous TID WC Amin, Sumayya, MD       insulin  aspart (novoLOG ) injection 0-5 Units  0-5 Units Subcutaneous QHS Amin, Sumayya, MD   2 Units at 05/18/24 2148   insulin  aspart (novoLOG ) injection 3 Units  3 Units Subcutaneous TID WC Amin, Sumayya, MD       lipase/protease/amylase (CREON ) capsule 72,000 Units  72,000 Units Oral TID AC Amin, Sumayya, MD   72,000 Units at 05/19/24 0810   melatonin tablet 5 mg  5 mg Oral QHS PRN Ezra Jackquline RAMAN, MD       metoprolol  succinate (TOPROL -XL) 24 hr tablet 12.5 mg  12.5 mg Oral Daily Ezra Jackquline RAMAN, MD   12.5 mg at 05/19/24 9187   ondansetron  (ZOFRAN ) injection 4 mg  4 mg Intravenous Q8H PRN Ezra Jackquline RAMAN, MD       oxyCODONE  (Oxy IR/ROXICODONE ) immediate release tablet 5-10 mg  5-10 mg Oral Q4H PRN Ezra Jackquline RAMAN, MD   10 mg at 05/18/24 1011   polyethylene glycol (MIRALAX  / GLYCOLAX ) packet 17 g  17 g Oral Daily PRN Ezra Jackquline RAMAN, MD       prednisoLONE  acetate (PRED FORTE ) 1 % ophthalmic suspension 1 drop  1 drop Left Eye QID Amin, Sumayya, MD   1 drop at 05/19/24 0813   rosuvastatin  (CRESTOR ) tablet 10 mg  10 mg Oral Daily Dalton, Stewart S, MD   10 mg at  05/19/24 9188     Discharge Medications: Please see discharge summary for a list of discharge medications.  Relevant Imaging Results:  Relevant Lab Results:   Additional Information: SS# [id number]    Victory Jackquline RAMAN, RN     "

## 2024-05-20 DIAGNOSIS — S72002A Fracture of unspecified part of neck of left femur, initial encounter for closed fracture: Secondary | ICD-10-CM | POA: Diagnosis not present

## 2024-05-20 DIAGNOSIS — E876 Hypokalemia: Secondary | ICD-10-CM | POA: Diagnosis not present

## 2024-05-20 DIAGNOSIS — E785 Hyperlipidemia, unspecified: Secondary | ICD-10-CM | POA: Diagnosis not present

## 2024-05-20 DIAGNOSIS — D62 Acute posthemorrhagic anemia: Secondary | ICD-10-CM | POA: Diagnosis not present

## 2024-05-20 DIAGNOSIS — F32A Depression, unspecified: Secondary | ICD-10-CM | POA: Diagnosis not present

## 2024-05-20 DIAGNOSIS — I5189 Other ill-defined heart diseases: Secondary | ICD-10-CM | POA: Diagnosis not present

## 2024-05-20 DIAGNOSIS — E119 Type 2 diabetes mellitus without complications: Secondary | ICD-10-CM | POA: Diagnosis not present

## 2024-05-20 LAB — CBC
HCT: 26.7 % — ABNORMAL LOW (ref 36.0–46.0)
Hemoglobin: 8.6 g/dL — ABNORMAL LOW (ref 12.0–15.0)
MCH: 28.2 pg (ref 26.0–34.0)
MCHC: 32.2 g/dL (ref 30.0–36.0)
MCV: 87.5 fL (ref 80.0–100.0)
Platelets: 250 K/uL (ref 150–400)
RBC: 3.05 MIL/uL — ABNORMAL LOW (ref 3.87–5.11)
RDW: 21.2 % — ABNORMAL HIGH (ref 11.5–15.5)
WBC: 7.5 K/uL (ref 4.0–10.5)
nRBC: 0 % (ref 0.0–0.2)

## 2024-05-20 LAB — GLUCOSE, CAPILLARY
Glucose-Capillary: 70 mg/dL (ref 70–99)
Glucose-Capillary: 140 mg/dL — ABNORMAL HIGH (ref 70–99)
Glucose-Capillary: 186 mg/dL — ABNORMAL HIGH (ref 70–99)
Glucose-Capillary: 120 mg/dL — ABNORMAL HIGH (ref 70–99)
Glucose-Capillary: 154 mg/dL — ABNORMAL HIGH (ref 70–99)

## 2024-05-20 MED ORDER — LOPERAMIDE HCL 2 MG PO CAPS
2.0000 mg | ORAL_CAPSULE | Freq: Once | ORAL | Status: AC
  Administered 2024-05-21: 2 mg via ORAL
  Filled 2024-05-20: qty 1

## 2024-05-20 NOTE — Care Management Obs Status (Signed)
 MEDICARE OBSERVATION STATUS NOTIFICATION   Patient Details  Name: Lisa [name] MRN: [id number] Date of Birth: February 26, 1946   Medicare Observation Status Notification Given:  no    Patient admitted   [name] 05/20/2024, 3:46 PM

## 2024-05-20 NOTE — Progress Notes (Signed)
 Occupational Therapy Treatment Patient Details Name: Lisa [name] MRN: [id number] DOB: 1945-09-04 Today's Date: 05/20/2024   History of present illness Pt is a 78 y.o. female s/p L hip hemiarthroplasty on 12/23 secondary to mechanical fall. PMH significant for HTN, DVT not on anticoagulation, chronic functional diarrhea, L TMA, R eye blindness, HLD, CHF, Type II DM, recently admitted (12/6-12/13/25) for acute on chronic HFpEF and electrolyte abnormalities.   OT comments  Pt greeted semi supine in bed, agreeable to OT tx session targeting improving functional activity tolerance in prep for ADL tasks. Pt noted with small BM, requires MAX A +2 for mobility attempts but is generally resistive and requires increased cueing for effective mobilization within precautions. Pt then noted to have increasing loose BM, requires MAX A +2 for return to semi supine. MAX A for rolling required with TOTAL A for peri care. Significantly increased time for all mobility attempts despite pt being pre medicated and notified of plans for mobility with therapy earlier on this date. Pt is in agreement for future attempts for edge/out of bed. Pt is left as received, all needs met. OT will continue to follow.       If plan is discharge home, recommend the following:  Two people to help with walking and/or transfers;Two people to help with bathing/dressing/bathroom;Help with stairs or ramp for entrance   Equipment Recommendations  BSC/3in1    Recommendations for Other Services      Precautions / Restrictions Precautions Precautions: Fall;Posterior Hip Recall of Precautions/Restrictions: Impaired Restrictions Weight Bearing Restrictions Per Provider Order: Yes LLE Weight Bearing Per Provider Order: Weight bearing as tolerated       Mobility Bed Mobility Overal bed mobility: Needs Assistance Bed Mobility: Supine to Sit, Rolling Rolling: Max assist, Used rails   Supine to sit: Max assist, +2 for  physical assistance, +2 for safety/equipment     General bed mobility comments: pt attempts to self direct session, refuses therapist assist in supine>sit and remains halway between semi supine>sitting on edge of bed. At that point, she has had more BM, needs to return to bed for peri care. MAX A +2 when pt allows herself to be mobilized by therapists.    Transfers                         Balance                                           ADL either performed or assessed with clinical judgement   ADL Overall ADL's : Needs assistance/impaired             Lower Body Bathing: Total assistance;Bed level       Lower Body Dressing: Total assistance;Bed level       Toileting- Clothing Manipulation and Hygiene: Total assistance;Maximal assistance;Bed level Toileting - Clothing Manipulation Details (indicate cue type and reason): incontinent BM            Extremity/Trunk Assessment              Vision       Perception     Praxis     Communication Communication Communication: No apparent difficulties   Cognition Arousal: Alert Behavior During Therapy: WFL for tasks assessed/performed Cognition: Cognition impaired     Awareness: Online awareness impaired Memory impairment (select all impairments): Declarative long-term memory  Executive functioning impairment (select all impairments): Problem solving, Reasoning OT - Cognition Comments: poor insight and awareness                 Following commands: Impaired Following commands impaired: Only follows one step commands consistently      Cueing   Cueing Techniques: Verbal cues, Tactile cues, Visual cues  Exercises Other Exercises Other Exercises: edu re role of OT, role of rehab, posterior hip preacutions, importance of continued mobility attempts    Shoulder Instructions       General Comments vss, dressing intact pre/post session    Pertinent Vitals/ Pain        Pain Assessment Pain Assessment: Faces Faces Pain Scale: Hurts even more Pain Location: L hip Pain Descriptors / Indicators: Aching, Throbbing Pain Intervention(s): Monitored during session, Repositioned, Limited activity within patient's tolerance, Premedicated before session  Home Living                                          Prior Functioning/Environment              Frequency  Min 2X/week        Progress Toward Goals  OT Goals(current goals can now be found in the care plan section)  Progress towards OT goals: Progressing toward goals  Acute Rehab OT Goals Time For Goal Achievement: 06/01/24  Plan      Co-evaluation    PT/OT/SLP Co-Evaluation/Treatment: Yes Reason for Co-Treatment: Complexity of the patient's impairments (multi-system involvement);Necessary to address cognition/behavior during functional activity;For patient/therapist safety;To address functional/ADL transfers   OT goals addressed during session: ADL's and self-care;Proper use of Adaptive equipment and DME;Strengthening/ROM      AM-PAC OT 6 Clicks Daily Activity     Outcome Measure   Help from another person eating meals?: None Help from another person taking care of personal grooming?: A Little Help from another person toileting, which includes using toliet, bedpan, or urinal?: Total Help from another person bathing (including washing, rinsing, drying)?: Total Help from another person to put on and taking off regular upper body clothing?: A Lot Help from another person to put on and taking off regular lower body clothing?: Total 6 Click Score: 12    End of Session    OT Visit Diagnosis: Unsteadiness on feet (R26.81);Other abnormalities of gait and mobility (R26.89);Muscle weakness (generalized) (M62.81);Pain;History of falling (Z91.81) Pain - Right/Left: Left Pain - part of body: Hip   Activity Tolerance Patient tolerated treatment well   Patient Left in  bed;with call bell/phone within reach;with bed alarm set   Nurse Communication Mobility status        Time: 8651-8573 OT Time Calculation (min): 38 min  Charges: OT General Charges $OT Visit: 1 Visit OT Treatments $Self Care/Home Management : 8-22 mins  Therisa Sheffield, OTD OTR/L  05/20/2024, 2:41 PM

## 2024-05-20 NOTE — Care Management Important Message (Signed)
 Important Message  Patient Details  Name: Lisa [name] MRN: [id number] Date of Birth: 10-02-1945   Important Message Given:  Yes - Medicare IM     Lisa [name] [name] 05/20/2024, 4:45 PM

## 2024-05-20 NOTE — Plan of Care (Signed)
" °  Problem: Education: Goal: Ability to describe self-care measures that may prevent or decrease complications (Diabetes Survival Skills Education) will improve Outcome: Progressing   Problem: Coping: Goal: Ability to adjust to condition or change in health will improve Outcome: Progressing   Problem: Fluid Volume: Goal: Ability to maintain a balanced intake and output will improve Outcome: Progressing   Problem: Metabolic: Goal: Ability to maintain appropriate glucose levels will improve Outcome: Progressing   Problem: Health Behavior/Discharge Planning: Goal: Ability to manage health-related needs will improve Outcome: Progressing   Problem: Nutritional: Goal: Maintenance of adequate nutrition will improve Outcome: Progressing   Problem: Skin Integrity: Goal: Risk for impaired skin integrity will decrease Outcome: Progressing   Problem: Education: Goal: Knowledge of General Education information will improve Description: Including pain rating scale, medication(s)/side effects and non-pharmacologic comfort measures Outcome: Progressing   Problem: Health Behavior/Discharge Planning: Goal: Ability to manage health-related needs will improve Outcome: Progressing   "

## 2024-05-20 NOTE — Progress Notes (Signed)
 Physical Therapy Treatment Patient Details Name: Lisa [name] MRN: [id number] DOB: 28-Mar-1946 Today's Date: 05/20/2024   History of Present Illness Pt is a 78 y.o. female s/p L hip hemiarthroplasty on 12/23 secondary to mechanical fall. PMH significant for HTN, DVT not on anticoagulation, chronic functional diarrhea, L TMA, R eye blindness, HLD, CHF, Type II DM, recently admitted (12/6-12/13/25) for acute on chronic HFpEF and electrolyte abnormalities.    PT Comments  Pt received upright in bed agreeable to PT/OT co-treat. Pt significantly pain limited during today's session. Requests to attempt to lead session for supine to sitting. Maximal effort and time with pt not allowing PT/OT to really physically assist despite education and encouragement. Pt stuck between supine and sitting with more liquid BM in chuck thus executive decision was made to semi roll R/L needing heavy 2 person physical assist for chuck pad replacement and dependent for pericare. Pt left in supine with all needs in reach. D/c recs remain appropriate.    If plan is discharge home, recommend the following: Two people to help with walking and/or transfers;Two people to help with bathing/dressing/bathroom;Assist for transportation;Assistance with cooking/housework;Help with stairs or ramp for entrance   Can travel by private vehicle     No  Equipment Recommendations  Rolling walker (2 wheels)    Recommendations for Other Services       Precautions / Restrictions Precautions Precautions: Fall;Posterior Hip Recall of Precautions/Restrictions: Impaired Precaution/Restrictions Comments: per secure chat with MD Dalton posterior hip precautions, WBAT (hx of L TMA) Restrictions Weight Bearing Restrictions Per Provider Order: Yes LLE Weight Bearing Per Provider Order: Weight bearing as tolerated Other Position/Activity Restrictions: posterior hip     Mobility  Bed Mobility Overal bed mobility: Needs  Assistance Bed Mobility: Supine to Sit, Rolling Rolling: Max assist, Used rails   Supine to sit: Max assist, +2 for physical assistance, +2 for safety/equipment     General bed mobility comments: pt attempts to self direct session, refuses therapist assist in supine>sit and remains halway between semi supine>sitting on edge of bed. At that point, she has had more BM, needs to return to bed for peri care. MAX A +2 when pt allows herself to be mobilized by therapists. Patient Response: Cooperative, Anxious  Transfers                        Ambulation/Gait                   Stairs             Wheelchair Mobility     Tilt Bed Tilt Bed Patient Response: Cooperative, Anxious  Modified Rankin (Stroke Patients Only)       Balance                                            Communication Communication Communication: No apparent difficulties  Cognition Arousal: Alert Behavior During Therapy: WFL for tasks assessed/performed   PT - Cognitive impairments: No apparent impairments                         Following commands: Impaired Following commands impaired: Only follows one step commands consistently    Cueing Cueing Techniques: Verbal cues, Tactile cues, Visual cues  Exercises Other Exercises Other Exercises: reviewed posterior hip precautions    General Comments  General comments (skin integrity, edema, etc.): vss, dressing intact pre/post session      Pertinent Vitals/Pain Pain Assessment Pain Assessment: Faces Faces Pain Scale: Hurts even more Pain Location: L hip Pain Descriptors / Indicators: Aching, Throbbing Pain Intervention(s): Monitored during session, Repositioned    Home Living                          Prior Function            PT Goals (current goals can now be found in the care plan section) Acute Rehab PT Goals Patient Stated Goal: to get better PT Goal Formulation: With  patient Time For Goal Achievement: 06/13/24 Potential to Achieve Goals: Fair Progress towards PT goals: Progressing toward goals    Frequency    Min 2X/week      PT Plan      Co-evaluation PT/OT/SLP Co-Evaluation/Treatment: Yes Reason for Co-Treatment: Complexity of the patient's impairments (multi-system involvement);Necessary to address cognition/behavior during functional activity;For patient/therapist safety;To address functional/ADL transfers PT goals addressed during session: Mobility/safety with mobility;Balance;Proper use of DME;Strengthening/ROM OT goals addressed during session: ADL's and self-care;Proper use of Adaptive equipment and DME;Strengthening/ROM      AM-PAC PT 6 Clicks Mobility   Outcome Measure  Help needed turning from your back to your side while in a flat bed without using bedrails?: A Lot Help needed moving from lying on your back to sitting on the side of a flat bed without using bedrails?: A Lot Help needed moving to and from a bed to a chair (including a wheelchair)?: A Lot Help needed standing up from a chair using your arms (e.g., wheelchair or bedside chair)?: Total Help needed to walk in hospital room?: Total Help needed climbing 3-5 steps with a railing? : Total 6 Click Score: 9    End of Session   Activity Tolerance: Other (comment);Patient limited by pain (limited by frequent BM) Patient left: in bed;with call bell/phone within reach;with bed alarm set Nurse Communication: Mobility status PT Visit Diagnosis: Muscle weakness (generalized) (M62.81);Difficulty in walking, not elsewhere classified (R26.2)     Time: 8649-8575 PT Time Calculation (min) (ACUTE ONLY): 34 min  Charges:    $Therapeutic Activity: 8-22 mins PT General Charges $$ ACUTE PT VISIT: 1 Visit                     Dorina HERO. Fairly IV, PT, DPT Physical Therapist- [place]  [facility] 05/20/2024, 3:40 PM

## 2024-05-20 NOTE — TOC Progression Note (Signed)
 Transition of Care [facility]) - Progression Note    Patient Details  Name: Lisa [name] MRN: [id number] Date of Birth: 30-Dec-1945  Transition [facility] CM/SW Contact  Alfonso Rummer, LCSW Phone Number: 05/20/2024, 4:51 PM  Clinical Narrative:    Pt advise of bed offers and selected [facility].   Expected Discharge Plan: Skilled Nursing Facility Barriers to Discharge: Continued Medical Work up               Expected Discharge Plan and Services       Living arrangements for the past 2 months: Single Family Home                                       Social Drivers of Health (SDOH) Interventions SDOH Screenings   Food Insecurity: No Food Insecurity (05/17/2024)  Housing: High Risk (05/17/2024)  Transportation Needs: Unmet Transportation Needs (05/17/2024)  Utilities: At Risk (05/17/2024)  Alcohol  Screen: Low Risk (02/23/2023)  Depression (PHQ2-9): Low Risk (02/02/2024)  Financial Resource Strain: Medium Risk (04/08/2023)  Social Connections: Unknown (05/17/2024)  Recent Concern: Social Connections - Socially Isolated (03/23/2024)  Tobacco Use: High Risk (05/16/2024)    Readmission Risk Interventions     No data to display
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
## 2024-05-20 NOTE — Progress Notes (Signed)
 " Subjective: 3 Days Post-Op Procedures (LRB): HEMIARTHROPLASTY (BIPOLAR) HIP, POSTERIOR APPROACH FOR FRACTURE (Left) Patient reports pain as moderate in the left hip this morning. Patient is well, and has had no acute complaints or problems Current plan is for d/c to SNF. Negative for chest pain and shortness of breath Fever: no Gastrointestinal:Negative for nausea and vomiting She has had a BM since surgery and reports more BMs this AM.  Objective: Vital signs in last 24 hours: Temp:  [98.4 F (36.9 C)-99.2 F (37.3 C)] 98.4 F (36.9 C) (12/26 0555) Pulse Rate:  [60-62] 61 (12/26 0555) Resp:  [16] 16 (12/26 0555) BP: (150-170)/(60-86) 170/86 (12/26 0555) SpO2:  [95 %-100 %] 95 % (12/26 0555)  Intake/Output from previous day:  Intake/Output Summary (Last 24 hours) at 05/20/2024 0749 Last data filed at 05/19/2024 1625 Gross per 24 hour  Intake 0 ml  Output 150 ml  Net -150 ml    Intake/Output this shift: No intake/output data recorded.  Labs: Recent Labs    05/18/24 0437 05/19/24 0804  HGB 9.3* 7.7*   Recent Labs    05/18/24 0437 05/19/24 0804  WBC 13.5* 7.8  RBC 3.31* 2.70*  HCT 29.0* 23.0*  PLT 278 225   Recent Labs    05/18/24 0437  NA 138  K 3.9  CL 105  CO2 19*  BUN 14  CREATININE 0.82  GLUCOSE 256*  CALCIUM  8.0*   No results for input(s): LABPT, INR in the last 72 hours.   EXAM General - Patient is Alert and Appropriate Extremity - ABD soft Neurovascular intact Dorsiflexion/Plantar flexion intact Incision: dressing C/D/I No cellulitis present Compartment soft Dressing/Incision - clean, dry, no drainage noted to the left hip honeycomb dressing. Motor Function - intact, moving foot well on exam, toes have been amputated in the past. Abdomen soft with intact bowel sounds this AM.  Past Medical History:  Diagnosis Date   Blindness of right eye    CHF (congestive heart failure) (HCC)    Depression    Diabetes mellitus without  complication (HCC)    DVT of leg (deep venous thrombosis) (HCC) 2011   Glaucoma    Grade II diastolic dysfunction    Hyperlipidemia    Hypertension    Vascular disease     Assessment/Plan: 3 Days Post-Op Procedures (LRB): HEMIARTHROPLASTY (BIPOLAR) HIP, POSTERIOR APPROACH FOR FRACTURE (Left) Principal Problem:   Left displaced femoral neck fracture (HCC) Active Problems:   Hyperlipidemia   Diabetes mellitus without complication (HCC)   Hypokalemia   Grade II diastolic dysfunction   Depression   Acute postoperative anemia due to expected blood loss  Estimated body mass index is 20.02 kg/m as calculated from the following:   Height as of this encounter: 5' 4 (1.626 m).   Weight as of this encounter: 52.9 kg. Advance diet Up with therapy D/C IV fluids when tolerating po intake.  Vitals reviewed this AM.  CBC has been ordered, Hg 7.7 this AM. Temp no recent fever., no signs of infection to the left hip. Patient is incontinent to bowel and bladder.  She has had multiple BM, will remove Miralax  from medication list. Up with therapy today.  Current plan is for d/c to SNF. Patient has had a BM. Continue Plavix .  Following discharge, follow-up with [facility] in 10-14 days for staple removal.  Ortho will sign off at this time.  DVT Prophylaxis - TED hose and Plavix  Weight-Bearing as tolerated to left leg  J. Gustavo Level, PA-C [facility] 05/20/2024, 7:49 AM    "

## 2024-05-21 DIAGNOSIS — I5189 Other ill-defined heart diseases: Secondary | ICD-10-CM | POA: Diagnosis not present

## 2024-05-21 DIAGNOSIS — E785 Hyperlipidemia, unspecified: Secondary | ICD-10-CM | POA: Diagnosis not present

## 2024-05-21 DIAGNOSIS — F32A Depression, unspecified: Secondary | ICD-10-CM | POA: Diagnosis not present

## 2024-05-21 DIAGNOSIS — E876 Hypokalemia: Secondary | ICD-10-CM | POA: Diagnosis not present

## 2024-05-21 DIAGNOSIS — S72002A Fracture of unspecified part of neck of left femur, initial encounter for closed fracture: Secondary | ICD-10-CM | POA: Diagnosis not present

## 2024-05-21 DIAGNOSIS — D62 Acute posthemorrhagic anemia: Secondary | ICD-10-CM | POA: Diagnosis not present

## 2024-05-21 DIAGNOSIS — E119 Type 2 diabetes mellitus without complications: Secondary | ICD-10-CM | POA: Diagnosis not present

## 2024-05-21 LAB — GLUCOSE, CAPILLARY
Glucose-Capillary: 158 mg/dL — ABNORMAL HIGH (ref 70–99)
Glucose-Capillary: 146 mg/dL — ABNORMAL HIGH (ref 70–99)
Glucose-Capillary: 112 mg/dL — ABNORMAL HIGH (ref 70–99)
Glucose-Capillary: 161 mg/dL — ABNORMAL HIGH (ref 70–99)

## 2024-05-21 NOTE — Plan of Care (Signed)
" °  Problem: Education: Goal: Knowledge of General Education information will improve Description: Including pain rating scale, medication(s)/side effects and non-pharmacologic comfort measures Outcome: Progressing   Problem: Health Behavior/Discharge Planning: Goal: Ability to manage health-related needs will improve Outcome: Progressing   Problem: Nutrition: Goal: Adequate nutrition will be maintained Outcome: Progressing   Problem: Elimination: Goal: Will not experience complications related to bowel motility Outcome: Progressing Goal: Will not experience complications related to urinary retention Outcome: Progressing   Problem: Activity: Goal: Risk for activity intolerance will decrease Outcome: Not Progressing   "

## 2024-05-21 NOTE — Progress Notes (Signed)
 "   Patient: Athleen Feltner FMW:[id number] DOB: 03-04-1946 DOA: 05/16/2024     4 DOS: the patient was seen and examined on 05/21/2024   Brief hospital course: Partly taken from H&P.  [place] Yulianna Folse is a 78 y.o. female with medical history significant for chronic functional diarrhea, chronic electrolyte imbalance, prior history of DVT not on anticoagulation, hypertension, hyperlipidemia, chronic HFpEF, type 2 diabetes, chronic anxiety/depression, recently admitted (12/6-12/13/25) for acute on chronic HFpEF and electrolyte abnormalities, who presents to the ER after a mechanical fall at home.   On presentation hemodynamically stable, imaging with concern of left displaced femoral neck fracture, case was discussed with orthopedic surgery and they advised admission under TRH and patient will be taken to the OR during the day.  12/23: Blood pressure mildly elevated at 171/73, leukocytosis improved, magnesium  at 1.7, potassium 3.1-replating magnesium  and potassium. Patient was taken to the OR s/p left hemiarthroplasty-tolerated the procedure well.  12/24: Blood pressure mildly elevated, hemoglobin with some decreased to 9.3, some leukocytosis likely reactive.  CBG elevated above 200.  12/25: Patient had 1 episode of low-grade fever at 100.1, no obvious sign of infection or complaint.  Leukocytosis resolved.  Hemoglobin with further decreased to 7.7.  Awaiting SNF placement.  12/26: Remained hemodynamically stable with some improvement of hemoglobin to 8.6.  Had a bed offer-pending insurance authorization for SNF.  12/27: Medically stable with pending insurance authorization for SNF.  Assessment and Plan: * Left displaced femoral neck fracture (HCC) Secondary to mechanical fall s/p hemiarthroplasty on 12/23 -Continue with pain management -PT and OT recommending SNF -TOC consult for placement  Hypokalemia Resolved with repletion. -Continue to monitor  Acute postoperative anemia  due to expected blood loss Hemoglobin decreased to 9.3>>7.7>>8.6 after hip fracture and surgery. - Continue with iron supplement -Monitor hemoglobin -Transfuse if below 7  Grade II diastolic dysfunction Last 2D echo done on 02/26/2021 revealed LVEF 60 to 65% with grade 2 diastolic dysfunction.  Clinically appears euvolemic -Strict intake and output -Daily weight  Hyperlipidemia - Continue home statin  Diabetes mellitus without complication (HCC) CBG elevated above 200, A1c of 6.4 - Changing to moderate SSI -Adding 3 units with meal  Depression - Continue home meds   Subjective: Patient was seen and examined today.  No new concern.  Physical Exam: Vitals:   05/20/24 2003 05/21/24 0507 05/21/24 0746 05/21/24 1403  BP: 137/64 (!) 157/70 (!) 159/86 (!) 170/62  Pulse: 62 (!) 59 62 65  Resp: 18 17 16 16   Temp: 98.9 F (37.2 C) 99.2 F (37.3 C) 99.5 F (37.5 C) 99.5 F (37.5 C)  TempSrc:   Axillary   SpO2: 100% 97% 98% 100%  Weight:      Height:       General.  Malnourished and frail elderly lady, in no acute distress. Pulmonary.  Lungs clear bilaterally, normal respiratory effort. CV.  Regular rate and rhythm, no JVD, rub or murmur. Abdomen.  Soft, nontender, nondistended, BS positive. CNS.  Alert and oriented .  No focal neurologic deficit. Extremities.  No edema, pulses intact and symmetrical. Psychiatry.  Judgment and insight appears normal.   Data Reviewed: Prior data reviewed  Family Communication:   Disposition: Status is: Inpatient Remains inpatient appropriate because: Severity of illness  Planned Discharge Destination: SNF  DVT prophylaxis.  SCDs-will start Lovenox  from tomorrow Time spent: 40 minutes  This record has been created using Conservation officer, historic buildings. Errors have been sought and corrected,but may not always be located.  Such creation errors do not reflect on the standard of care.   Author: Amaryllis Dare, MD 05/21/2024 4:23  PM  For on call review [email or web address].  "

## 2024-05-21 NOTE — Plan of Care (Signed)

## 2024-05-22 DIAGNOSIS — E785 Hyperlipidemia, unspecified: Secondary | ICD-10-CM | POA: Diagnosis not present

## 2024-05-22 DIAGNOSIS — F32A Depression, unspecified: Secondary | ICD-10-CM | POA: Diagnosis not present

## 2024-05-22 DIAGNOSIS — D62 Acute posthemorrhagic anemia: Secondary | ICD-10-CM | POA: Diagnosis not present

## 2024-05-22 DIAGNOSIS — S72002A Fracture of unspecified part of neck of left femur, initial encounter for closed fracture: Secondary | ICD-10-CM | POA: Diagnosis not present

## 2024-05-22 DIAGNOSIS — E119 Type 2 diabetes mellitus without complications: Secondary | ICD-10-CM | POA: Diagnosis not present

## 2024-05-22 DIAGNOSIS — I5189 Other ill-defined heart diseases: Secondary | ICD-10-CM | POA: Diagnosis not present

## 2024-05-22 DIAGNOSIS — E876 Hypokalemia: Secondary | ICD-10-CM | POA: Diagnosis not present

## 2024-05-22 LAB — GLUCOSE, CAPILLARY
Glucose-Capillary: 123 mg/dL — ABNORMAL HIGH (ref 70–99)
Glucose-Capillary: 123 mg/dL — ABNORMAL HIGH (ref 70–99)
Glucose-Capillary: 121 mg/dL — ABNORMAL HIGH (ref 70–99)
Glucose-Capillary: 195 mg/dL — ABNORMAL HIGH (ref 70–99)

## 2024-05-22 NOTE — Progress Notes (Signed)
 Pt refused 1700 dinner insulin  dose. Pt stated she normally does NOT take insulin  at home nor does she check her glucose. Glucose 121 at this time. Pt did not like her dinner very much and pt consumed <50% of meal.

## 2024-05-22 NOTE — Progress Notes (Signed)
 "   Patient: Lisa [name] FMW:[id number] DOB: [birth year] DOA: 05/16/2024     5 DOS: the patient was seen and examined on 05/22/2024   Brief hospital course: Partly taken from H&P.  Lisa [name] is a 78 y.o. female with medical history significant for chronic functional diarrhea, chronic electrolyte imbalance, prior history of DVT not on anticoagulation, hypertension, hyperlipidemia, chronic HFpEF, type 2 diabetes, chronic anxiety/depression, recently admitted (12/6-12/13/25) for acute on chronic HFpEF and electrolyte abnormalities, who presents to the ER after a mechanical fall at home.   On presentation hemodynamically stable, imaging with concern of left displaced femoral neck fracture, case was discussed with orthopedic surgery and they advised admission under TRH and patient will be taken to the OR during the day.  12/23: Blood pressure mildly elevated at 171/73, leukocytosis improved, magnesium  at 1.7, potassium 3.1-replating magnesium  and potassium. Patient was taken to the OR s/p left hemiarthroplasty-tolerated the procedure well.  12/24: Blood pressure mildly elevated, hemoglobin with some decreased to 9.3, some leukocytosis likely reactive.  CBG elevated above 200.  12/25: Patient had 1 episode of low-grade fever at 100.1, no obvious sign of infection or complaint.  Leukocytosis resolved.  Hemoglobin with further decreased to 7.7.  Awaiting SNF placement.  12/26: Remained hemodynamically stable with some improvement of hemoglobin to 8.6.  Had a bed offer-pending insurance authorization for SNF.  12/27: Medically stable with pending insurance authorization for SNF.  12/28: Hemodynamically stable, having some diarrhea after taking pills.  Will trial iron supplement with meals.  Awaiting insurance authorization.  Assessment and Plan: * Left displaced femoral neck fracture (HCC) Secondary to mechanical fall s/p hemiarthroplasty on 12/23 -Continue with pain  management -PT and OT recommending SNF -TOC consult for placement  Hypokalemia Resolved with repletion. -Continue to monitor  Acute postoperative anemia due to expected blood loss Hemoglobin decreased to 9.3>>7.7>>8.6 after hip fracture and surgery. - Continue with iron supplement -Monitor hemoglobin -Transfuse if below 7  Grade II diastolic dysfunction Last 2D echo done on 02/26/2021 revealed LVEF 60 to 65% with grade 2 diastolic dysfunction.  Clinically appears euvolemic -Strict intake and output -Daily weight  Hyperlipidemia - Continue home statin  Diabetes mellitus without complication (HCC) CBG now within goal, A1c of 6.4 - Continue with moderate SSI - Continue with 3 units with meal  Depression - Continue home meds   Subjective: Patient was seen and examined today.  Having some diarrhea stating that it is due to my new pill.  No abdominal pain.  Physical Exam: Vitals:   05/21/24 1949 05/22/24 0458 05/22/24 0724 05/22/24 1544  BP: (!) 158/69 136/80 (!) 151/64 (!) 169/70  Pulse: 63 (!) 53 (!) 55 65  Resp: 18 16 16 17   Temp: 100.2 F (37.9 C) (!) 97.3 F (36.3 C) 98.2 F (36.8 C) 100 F (37.8 C)  TempSrc: Oral     SpO2: 100% 100% 100% 100%  Weight:      Height:       General. Frail and malnourished elderly lady, in no acute distress. Pulmonary.  Lungs clear bilaterally, normal respiratory effort. CV.  Regular rate and rhythm, no JVD, rub or murmur. Abdomen.  Soft, nontender, nondistended, BS positive. CNS.  Alert and oriented .  No focal neurologic deficit. Extremities.  No edema,  pulses intact and symmetrical.  Left TMA. Psychiatry.  Judgment and insight appears normal.    Data Reviewed: Prior data reviewed  Family Communication:   Disposition: Status is: Inpatient Remains inpatient appropriate because: Severity  of illness  Planned Discharge Destination: SNF  DVT prophylaxis.  SCDs-will start Lovenox  from tomorrow Time spent: 40 minutes  This  record has been created using Conservation officer, historic buildings. Errors have been sought and corrected,but may not always be located. Such creation errors do not reflect on the standard of care.   Author: Amaryllis Dare, MD 05/22/2024 4:11 PM  For on call review [email or web address].  "

## 2024-05-22 NOTE — Progress Notes (Signed)
 Physical Therapy Treatment Patient Details Name: Lisa [name] MRN: [id number] DOB: June 23, 1945 Today's Date: 05/22/2024   History of Present Illness Pt is a 78 y.o. female s/p L hip hemiarthroplasty on 12/23 secondary to mechanical fall. PMH significant for HTN, DVT not on anticoagulation, chronic functional diarrhea, L TMA, R eye blindness, HLD, CHF, Type II DM, recently admitted (12/6-12/13/25) for acute on chronic HFpEF and electrolyte abnormalities.    PT Comments  Tech in upon arrival for inc BM care.  Stayed to assist.  She continues to self direct treatment sessions and takes increased time to prepare and initiate movement.  She attempt to roll L initially and struggles but does roll R with improved success and is able to roll R with cga x 1 with rail and time.  Pillow placed between legs for support.  She is able to hold position for care.  After care and linen change, she does agree to try sitting EOB.  She is given 15 minutes to attempt but makes only slight progress towards EOB.  She is offered and encouraged to accept assist but resists Let me do it on my own will make slight progress then returns back.  She is able to move RLE well and without difficulty/pain which allows her to reposition and assist in repositioning back up in bed.   If plan is discharge home, recommend the following: Two people to help with walking and/or transfers;Two people to help with bathing/dressing/bathroom;Assist for transportation;Assistance with cooking/housework;Help with stairs or ramp for entrance   Can travel by private vehicle        Equipment Recommendations       Recommendations for Other Services       Precautions / Restrictions Precautions Precautions: Fall;Posterior Hip Recall of Precautions/Restrictions: Impaired Precaution/Restrictions Comments: per secure chat with MD Dalton posterior hip precautions, WBAT (hx of L TMA) Restrictions Weight Bearing Restrictions Per Provider  Order: Yes LLE Weight Bearing Per Provider Order: Weight bearing as tolerated Other Position/Activity Restrictions: posterior hip     Mobility  Bed Mobility Overal bed mobility: Needs Assistance Bed Mobility: Rolling Rolling: Contact guard assist, Min assist         General bed mobility comments: significant increase time for her to do on her own but she is able to right with rails    Transfers                        Ambulation/Gait                   Stairs             Wheelchair Mobility     Tilt Bed    Modified Rankin (Stroke Patients Only)       Balance                                            Communication Communication Communication: No apparent difficulties  Cognition Arousal: Alert Behavior During Therapy: WFL for tasks assessed/performed   PT - Cognitive impairments: No apparent impairments                         Following commands: Impaired Following commands impaired: Only follows one step commands consistently    Cueing Cueing Techniques: Verbal cues, Tactile cues, Visual cues  Exercises  General Comments        Pertinent Vitals/Pain Pain Assessment Pain Assessment: Faces Faces Pain Scale: Hurts even more Pain Location: L hip Pain Descriptors / Indicators: Aching, Guarding Pain Intervention(s): Monitored during session, Limited activity within patient's tolerance, Repositioned    Home Living                          Prior Function            PT Goals (current goals can now be found in the care plan section) Progress towards PT goals: Progressing toward goals    Frequency    Min 2X/week      PT Plan      Co-evaluation              AM-PAC PT 6 Clicks Mobility   Outcome Measure  Help needed turning from your back to your side while in a flat bed without using bedrails?: A Little Help needed moving from lying on your back to sitting on the  side of a flat bed without using bedrails?: A Lot Help needed moving to and from a bed to a chair (including a wheelchair)?: Total Help needed standing up from a chair using your arms (e.g., wheelchair or bedside chair)?: Total Help needed to walk in hospital room?: Total Help needed climbing 3-5 steps with a railing? : Total 6 Click Score: 9    End of Session   Activity Tolerance: Patient limited by pain Patient left: in bed;with call bell/phone within reach;with bed alarm set Nurse Communication: Mobility status PT Visit Diagnosis: Muscle weakness (generalized) (M62.81);Difficulty in walking, not elsewhere classified (R26.2)     Time: 8874-8851 PT Time Calculation (min) (ACUTE ONLY): 23 min  Charges:    $Therapeutic Activity: 23-37 mins PT General Charges $$ ACUTE PT VISIT: 1 Visit                   Lauraine Gills, PTA 05/22/2024, 12:50 PM

## 2024-05-22 NOTE — Plan of Care (Signed)

## 2024-05-23 DIAGNOSIS — E119 Type 2 diabetes mellitus without complications: Secondary | ICD-10-CM | POA: Diagnosis not present

## 2024-05-23 DIAGNOSIS — E876 Hypokalemia: Secondary | ICD-10-CM | POA: Diagnosis not present

## 2024-05-23 DIAGNOSIS — D62 Acute posthemorrhagic anemia: Secondary | ICD-10-CM | POA: Diagnosis not present

## 2024-05-23 DIAGNOSIS — F32A Depression, unspecified: Secondary | ICD-10-CM | POA: Diagnosis not present

## 2024-05-23 DIAGNOSIS — S72002A Fracture of unspecified part of neck of left femur, initial encounter for closed fracture: Secondary | ICD-10-CM | POA: Diagnosis not present

## 2024-05-23 DIAGNOSIS — I5189 Other ill-defined heart diseases: Secondary | ICD-10-CM | POA: Diagnosis not present

## 2024-05-23 DIAGNOSIS — E785 Hyperlipidemia, unspecified: Secondary | ICD-10-CM | POA: Diagnosis not present

## 2024-05-23 LAB — GLUCOSE, CAPILLARY
Glucose-Capillary: 157 mg/dL — ABNORMAL HIGH (ref 70–99)
Glucose-Capillary: 132 mg/dL — ABNORMAL HIGH (ref 70–99)
Glucose-Capillary: 150 mg/dL — ABNORMAL HIGH (ref 70–99)
Glucose-Capillary: 74 mg/dL (ref 70–99)

## 2024-05-23 LAB — CBC
HCT: 25.6 % — ABNORMAL LOW (ref 36.0–46.0)
Hemoglobin: 8.2 g/dL — ABNORMAL LOW (ref 12.0–15.0)
MCH: 27.7 pg (ref 26.0–34.0)
MCHC: 32 g/dL (ref 30.0–36.0)
MCV: 86.5 fL (ref 80.0–100.0)
Platelets: 282 K/uL (ref 150–400)
RBC: 2.96 MIL/uL — ABNORMAL LOW (ref 3.87–5.11)
RDW: 19.8 % — ABNORMAL HIGH (ref 11.5–15.5)
WBC: 5.9 K/uL (ref 4.0–10.5)
nRBC: 0 % (ref 0.0–0.2)

## 2024-05-23 LAB — BASIC METABOLIC PANEL WITH GFR
Anion gap: 10 (ref 5–15)
BUN: 12 mg/dL (ref 8–23)
CO2: 23 mmol/L (ref 22–32)
Calcium: 8.4 mg/dL — ABNORMAL LOW (ref 8.9–10.3)
Chloride: 106 mmol/L (ref 98–111)
Creatinine, Ser: 0.63 mg/dL (ref 0.44–1.00)
GFR, Estimated: >60 mL/min
Glucose, Bld: 138 mg/dL — ABNORMAL HIGH (ref 70–99)
Potassium: 3.6 mmol/L (ref 3.5–5.1)
Sodium: 138 mmol/L (ref 135–145)

## 2024-05-23 MED ORDER — ATROPINE SULFATE 1 % OP SOLN
1.0000 [drp] | Freq: Three times a day (TID) | OPHTHALMIC | Status: DC
  Administered 2024-05-23 – 2024-05-24 (×3): 1 [drp] via OPHTHALMIC
  Filled 2024-05-23: qty 2

## 2024-05-23 NOTE — Progress Notes (Signed)
 "   Patient: Lisa [name] FMW:[id number] DOB: [birth year] DOA: 05/16/2024     6 DOS: the patient was seen and examined on 05/23/2024   Brief hospital course: Partly taken from H&P.  Lisa [name] is a 78 y.o. female with medical history significant for chronic functional diarrhea, chronic electrolyte imbalance, prior history of DVT not on anticoagulation, hypertension, hyperlipidemia, chronic HFpEF, type 2 diabetes, chronic anxiety/depression, recently admitted (12/6-12/13/25) for acute on chronic HFpEF and electrolyte abnormalities, who presents to the ER after a mechanical fall at home.   On presentation hemodynamically stable, imaging with concern of left displaced femoral neck fracture, case was discussed with orthopedic surgery and they advised admission under TRH and patient will be taken to the OR during the day.  12/23: Blood pressure mildly elevated at 171/73, leukocytosis improved, magnesium  at 1.7, potassium 3.1-replating magnesium  and potassium. Patient was taken to the OR s/p left hemiarthroplasty-tolerated the procedure well.  12/24: Blood pressure mildly elevated, hemoglobin with some decreased to 9.3, some leukocytosis likely reactive.  CBG elevated above 200.  12/25: Patient had 1 episode of low-grade fever at 100.1, no obvious sign of infection or complaint.  Leukocytosis resolved.  Hemoglobin with further decreased to 7.7.  Awaiting SNF placement.  12/26: Remained hemodynamically stable with some improvement of hemoglobin to 8.6.  Had a bed offer-pending insurance authorization for SNF.  12/27: Medically stable with pending insurance authorization for SNF.  12/28: Hemodynamically stable, having some diarrhea after taking pills.  Will trial iron supplement with meals.  Awaiting insurance authorization.  12/29: Obtained authorization, facility will take her tomorrow  Assessment and Plan: * Left displaced femoral neck fracture (HCC) Secondary to mechanical  fall s/p hemiarthroplasty on 12/23 -Continue with pain management -PT and OT recommending SNF -TOC consult for placement  Hypokalemia Resolved with repletion. -Continue to monitor  Acute postoperative anemia due to expected blood loss Hemoglobin decreased to 9.3>>7.7>>8.6 after hip fracture and surgery. - Continue with iron supplement -Monitor hemoglobin -Transfuse if below 7  Grade II diastolic dysfunction Last 2D echo done on 02/26/2021 revealed LVEF 60 to 65% with grade 2 diastolic dysfunction.  Clinically appears euvolemic -Strict intake and output -Daily weight  Hyperlipidemia - Continue home statin  Diabetes mellitus without complication (HCC) CBG now within goal, A1c of 6.4 - Continue with moderate SSI - Continue with 3 units with meal  Depression - Continue home meds   Subjective: Patient was seen and examined today.  Still having complaints regarding staff that they are not very gentle and moving her.  Physical Exam: Vitals:   05/22/24 2150 05/23/24 0600 05/23/24 0814 05/23/24 1635  BP: (!) 160/69 (!) 152/66 (!) 152/62 (!) 152/58  Pulse: 67 (!) 58 (!) 59 (!) 58  Resp: 18 18 17 18   Temp: 98.1 F (36.7 C) 98.6 F (37 C) 98.8 F (37.1 C) 99.1 F (37.3 C)  TempSrc: Oral Oral    SpO2: 99% 99% 97% 100%  Weight:      Height:       General.  Frail and malnourished elderly lady, in no acute distress. Pulmonary.  Lungs clear bilaterally, normal respiratory effort. CV.  Regular rate and rhythm, no JVD, rub or murmur. Abdomen.  Soft, nontender, nondistended, BS positive. CNS.  Alert and oriented .  No focal neurologic deficit. Extremities.  No edema,  pulses intact and symmetrical. Psychiatry.  Judgment and insight appears normal.   Data Reviewed: Prior data reviewed  Family Communication:   Disposition: Status is: Inpatient  Remains inpatient appropriate because: Severity of illness  Planned Discharge Destination: SNF  DVT prophylaxis.  SCDs-will start  Lovenox  from tomorrow Time spent: 40 minutes  This record has been created using Conservation officer, historic buildings. Errors have been sought and corrected,but may not always be located. Such creation errors do not reflect on the standard of care.   Author: Amaryllis Dare, MD 05/23/2024 6:04 PM  For on call review [email or web address].  "

## 2024-05-23 NOTE — Progress Notes (Signed)
 Patient refused 12:00 insulin . Patient asked why was this nurse bothering her and to please leave her alone.

## 2024-05-23 NOTE — NC FL2 (Signed)
 " [place]  MEDICAID FL2 LEVEL OF CARE FORM     IDENTIFICATION  Patient Name: Lisa [name] Birthdate: 1946-01-02 Sex: female Admission Date (Current Location): 05/16/2024  [facility] and [place] Number:  Chiropodist and Address:  [facility], [address] [place], [place] [address]      Provider Number: [id number]  Attending Physician Name and Address:  Caleen Qualia, MD  Relative Name and Phone Number:       Current Level of Care: Hospital Recommended Level of Care: Skilled Nursing Facility Prior Approval Number:    Date Approved/Denied:   PASRR Number: Pending  Discharge Plan: SNF    Current Diagnoses: Patient Active Problem List   Diagnosis Date Noted   Acute postoperative anemia due to expected blood loss 05/18/2024   Left displaced femoral neck fracture (HCC) 05/17/2024   Grade II diastolic dysfunction    Depression    Acute on chronic diastolic CHF (congestive heart failure) (HCC) 05/03/2024   Fever 05/02/2024   Blurry vision, left eye 05/02/2024   Generalized weakness 05/02/2024   Shortness of breath 05/02/2024   Potassium-sensitive periodic paralysis 05/01/2024   Uncontrolled type 2 diabetes mellitus with hypoglycemia, without long-term current use of insulin  (HCC) 05/01/2024   Mediastinal mass 05/01/2024   Hypocalcemia 05/01/2024   Hypokalemia 04/30/2024   Diarrhea 03/24/2024   Urinary incontinence 03/23/2024   Acute hypokalemia and hypomagnesemia secondary to diarrhea 03/22/2024   Hypomagnesemia 03/22/2024   Chronic diarrhea of unknown origin 03/22/2024   History of DVT (deep vein thrombosis) 03/22/2024   Anemia 03/22/2024   Sinus bradycardia 03/22/2024   Hypertensive urgency 04/08/2023   Malnutrition of moderate degree 04/08/2023   Unilateral amputation of left foot (HCC) 02/19/2022   Hypertension 02/19/2022   Nicotine  dependence 04/09/2021   Syncope and collapse 02/26/2021   Prolonged QT interval  02/26/2021   DM (diabetes mellitus), type 2 with peripheral vascular complications (HCC) 09/25/2015   Amputation of left foot (HCC) 12/02/2014   Benign essential HTN 12/02/2014   Depression, major, recurrent, mild (HCC) 12/02/2014   Hyperlipidemia 12/02/2014   Vitamin B12 deficiency 12/02/2014   Peripheral neuropathic pain 12/02/2014   Atherosclerosis of native arteries of extremity with rest pain (HCC) 12/02/2014   Phantom limb (HCC) 12/02/2014   Diabetes mellitus without complication (HCC) 12/02/2014   Detached retina 12/02/2014   Tobacco abuse 12/02/2014   Urge incontinence 12/02/2014   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   Vitamin D  deficiency 12/02/2014    Orientation RESPIRATION BLADDER Height & Weight     Self, Time, Situation, Place  Normal Incontinent Weight: 52.9 kg Height:  5' 4 (162.6 cm)  BEHAVIORAL SYMPTOMS/MOOD NEUROLOGICAL BOWEL NUTRITION STATUS      Continent Diet (Heart Heatly Carb modifed)  AMBULATORY STATUS COMMUNICATION OF NEEDS Skin   Total Care Verbally Surgical wounds                       Personal Care Assistance Level of Assistance  Dressing Bathing Assistance: Limited assistance Feeding assistance: Independent Dressing Assistance: Limited assistance     Functional Limitations Info    Sight Info: Adequate Hearing Info: Adequate Speech Info: Adequate    SPECIAL CARE FACTORS FREQUENCY  PT (By licensed PT), OT (By licensed OT)     PT Frequency: 3X/WEEK OT Frequency: 3X/WEEK            Contractures Contractures Info: Not present    Additional Factors Info  Code Status, Allergies Code Status Info:  Full Allergies Info: Other, Tapentadol Hcl, Actos  (Pioglitazone ), Penicillins           Current Medications (05/23/2024):  This is the current hospital active medication list Current Facility-Administered Medications  Medication Dose Route Frequency Provider Last Rate Last Admin   acetaminophen  (TYLENOL ) tablet 500 mg  500 mg Oral Q6H PRN  Ezra Jackquline RAMAN, MD   500 mg at 05/21/24 2105   amLODipine  (NORVASC ) tablet 10 mg  10 mg Oral Daily Ezra Jackquline RAMAN, MD   10 mg at 05/23/24 9163   atropine  1 % ophthalmic solution 1 drop  1 drop Left Eye TID Nada Adriana BIRCH, [facility]       brimonidine  (ALPHAGAN ) 0.2 % ophthalmic solution 1 drop  1 drop Both Eyes BID Amin, Sumayya, MD   1 drop at 05/23/24 0836   citalopram  (CELEXA ) tablet 10 mg  10 mg Oral Daily Dalton, Stewart S, MD   10 mg at 05/23/24 9163   clopidogrel  (PLAVIX ) tablet 75 mg  75 mg Oral Daily Kip Lynwood Double, PA-C   75 mg at 05/23/24 9164   dorzolamide -timolol  (COSOPT ) 2-0.5 % ophthalmic solution 1 drop  1 drop Both Eyes BID Amin, Sumayya, MD   1 drop at 05/23/24 0836   Fe Fum-Vit C-Vit B12-FA (TRIGELS-F FORTE) capsule 1 capsule  1 capsule Oral BID Amin, Sumayya, MD   1 capsule at 05/23/24 0834   fentaNYL  (SUBLIMAZE ) injection 50 mcg  50 mcg Intravenous Q2H PRN Ezra Jackquline RAMAN, MD   50 mcg at 05/20/24 9358   hydrALAZINE  (APRESOLINE ) tablet 25 mg  25 mg Oral Q8H PRN Amin, Sumayya, MD       insulin  aspart (novoLOG ) injection 0-15 Units  0-15 Units Subcutaneous TID WC Amin, Sumayya, MD   3 Units at 05/23/24 0835   insulin  aspart (novoLOG ) injection 0-5 Units  0-5 Units Subcutaneous QHS Amin, Sumayya, MD   2 Units at 05/18/24 2148   insulin  aspart (novoLOG ) injection 3 Units  3 Units Subcutaneous TID WC Amin, Sumayya, MD   3 Units at 05/22/24 9177   lipase/protease/amylase (CREON ) capsule 72,000 Units  72,000 Units Oral TID AC Amin, Sumayya, MD   72,000 Units at 05/22/24 0913   melatonin tablet 5 mg  5 mg Oral QHS PRN Ezra Jackquline RAMAN, MD   5 mg at 05/22/24 2200   metoprolol  succinate (TOPROL -XL) 24 hr tablet 12.5 mg  12.5 mg Oral Daily Ezra Jackquline RAMAN, MD   12.5 mg at 05/23/24 9164   ondansetron  (ZOFRAN ) injection 4 mg  4 mg Intravenous Q8H PRN Ezra Jackquline RAMAN, MD       oxyCODONE  (Oxy IR/ROXICODONE ) immediate release tablet 5-10 mg  5-10 mg Oral Q4H PRN Ezra Jackquline RAMAN, MD    5 mg at 05/22/24 2159   prednisoLONE  acetate (PRED FORTE ) 1 % ophthalmic suspension 1 drop  1 drop Left Eye QID Amin, Sumayya, MD   1 drop at 05/23/24 9163   rosuvastatin  (CRESTOR ) tablet 10 mg  10 mg Oral Daily Dalton, Stewart S, MD   10 mg at 05/23/24 9163     Discharge Medications: Please see discharge summary for a list of discharge medications.  Relevant Imaging Results:  Relevant Lab Results:   Additional Information SS [id number]  Corean ONEIDA Haddock, RN     "

## 2024-05-23 NOTE — TOC Progression Note (Signed)
 Transition of Care [facility]) - Progression Note    Patient Details  Name: Lisa [name] MRN: [id number] Date of Birth: 1945/06/30  [facility]) CM/SW Contact  Corean ONEIDA Haddock, RN Phone Number: 05/23/2024, 11:03 AM  Clinical Narrative:      DONALD pending, clinical uploaded to [facility]   Expected Discharge Plan: Skilled Nursing Facility Barriers to Discharge: Continued Medical Work up               Expected Discharge Plan and Services       Living arrangements for the past 2 months: Single Family Home                                       Social Drivers of Health (SDOH) Interventions SDOH Screenings   Food Insecurity: No Food Insecurity (05/17/2024)  Housing: High Risk (05/17/2024)  Transportation Needs: Unmet Transportation Needs (05/17/2024)  Utilities: At Risk (05/17/2024)  Alcohol  Screen: Low Risk (02/23/2023)  Depression (PHQ2-9): Low Risk (02/02/2024)  Financial Resource Strain: Medium Risk (04/08/2023)  Social Connections: Unknown (05/17/2024)  Recent Concern: Social Connections - Socially Isolated (03/23/2024)  Tobacco Use: High Risk (05/16/2024)    Readmission Risk Interventions     No data to display

## 2024-05-23 NOTE — Plan of Care (Signed)

## 2024-05-23 NOTE — Progress Notes (Signed)
 Patient refusing to take lipase because she stated it gives her diarrhea.

## 2024-05-23 NOTE — TOC PASRR Note (Signed)
 30 Day PASRR Note   Patient Details  Name: Lisa [name] Date of Birth: [birth year]   Transition of Care [facility]) CM/SW Contact:    Corean ONEIDA Haddock, RN Phone Number: 05/23/2024, 10:43 AM  To Whom It May Concern:  Please be advised that this patient will require a short-term nursing home stay - anticipated 30 days or less for rehabilitation and strengthening.   The plan is for return home.

## 2024-05-23 NOTE — TOC Progression Note (Addendum)
 Transition of Care [facility]) - Progression Note    Patient Details  Name: Lisa [name] MRN: [id number] Date of Birth: 1946/01/03  Transition of Care [facility]) CM/SW Contact  Corean ONEIDA Haddock, RN Phone Number: 05/23/2024, 2:38 PM  Clinical Narrative:     DONALD received [id number] A Message sent to [facility] to determine if patient can admit today   Expected Discharge Plan: Skilled Nursing Facility Barriers to Discharge: Continued Medical Work up               Expected Discharge Plan and Services       Living arrangements for the past 2 months: Single Family Home                                       Social Drivers of Health (SDOH) Interventions SDOH Screenings   Food Insecurity: No Food Insecurity (05/17/2024)  Housing: High Risk (05/17/2024)  Transportation Needs: Unmet Transportation Needs (05/17/2024)  Utilities: At Risk (05/17/2024)  Alcohol  Screen: Low Risk (02/23/2023)  Depression (PHQ2-9): Low Risk (02/02/2024)  Financial Resource Strain: Medium Risk (04/08/2023)  Social Connections: Unknown (05/17/2024)  Recent Concern: Social Connections - Socially Isolated (03/23/2024)  Tobacco Use: High Risk (05/16/2024)    Readmission Risk Interventions     No data to display

## 2024-05-24 DIAGNOSIS — S72002A Fracture of unspecified part of neck of left femur, initial encounter for closed fracture: Secondary | ICD-10-CM | POA: Diagnosis not present

## 2024-05-24 DIAGNOSIS — E876 Hypokalemia: Secondary | ICD-10-CM | POA: Diagnosis not present

## 2024-05-24 DIAGNOSIS — F32A Depression, unspecified: Secondary | ICD-10-CM | POA: Diagnosis not present

## 2024-05-24 DIAGNOSIS — D62 Acute posthemorrhagic anemia: Secondary | ICD-10-CM | POA: Diagnosis not present

## 2024-05-24 DIAGNOSIS — E785 Hyperlipidemia, unspecified: Secondary | ICD-10-CM | POA: Diagnosis not present

## 2024-05-24 DIAGNOSIS — I5189 Other ill-defined heart diseases: Secondary | ICD-10-CM | POA: Diagnosis not present

## 2024-05-24 DIAGNOSIS — E119 Type 2 diabetes mellitus without complications: Secondary | ICD-10-CM | POA: Diagnosis not present

## 2024-05-24 LAB — GLUCOSE, CAPILLARY: Glucose-Capillary: 143 mg/dL — ABNORMAL HIGH (ref 70–99)

## 2024-05-24 MED ORDER — ACETAMINOPHEN 325 MG PO TABS
ORAL_TABLET | ORAL | Status: AC
  Filled 2024-05-24: qty 2

## 2024-05-24 MED ORDER — FE FUM-VIT C-VIT B12-FA 460-60-0.01-1 MG PO CAPS: 1.0000 | ORAL_CAPSULE | Freq: Two times a day (BID) | ORAL | Status: AC

## 2024-05-24 MED ORDER — ACETAMINOPHEN 500 MG PO TABS
ORAL_TABLET | ORAL | Status: AC
  Filled 2024-05-24: qty 1

## 2024-05-24 MED ORDER — LOSARTAN POTASSIUM 25 MG PO TABS
25.0000 mg | ORAL_TABLET | Freq: Every day | ORAL | Status: DC
  Administered 2024-05-24: 25 mg via ORAL
  Filled 2024-05-24: qty 1

## 2024-05-24 NOTE — Progress Notes (Signed)
 Spoke with Alicia at [facility] in attempt to give report for patient prior to her discharge. Russell reports system is down she took this RN's number and will have nurse call me back.

## 2024-05-24 NOTE — Plan of Care (Signed)
" °  Problem: Safety: Goal: Ability to remain free from injury will improve 05/24/2024 1031 by Leonce Norene BIRCH, RN Outcome: Adequate for Discharge 05/24/2024 1031 by Leonce Norene BIRCH, RN Outcome: Adequate for Discharge   Problem: Skin Integrity: Goal: Risk for impaired skin integrity will decrease 05/24/2024 1031 by Leonce Norene BIRCH, RN Outcome: Adequate for Discharge 05/24/2024 1031 by Leonce Norene BIRCH, RN Outcome: Adequate for Discharge   Problem: Pain Managment: Goal: General experience of comfort will improve and/or be controlled 05/24/2024 1031 by Leonce Norene BIRCH, RN Outcome: Adequate for Discharge 05/24/2024 1031 by Leonce Norene BIRCH, RN Outcome: Adequate for Discharge   Problem: Elimination: Goal: Will not experience complications related to bowel motility 05/24/2024 1031 by Leonce Norene BIRCH, RN Outcome: Adequate for Discharge 05/24/2024 1031 by Leonce Norene BIRCH, RN Outcome: Adequate for Discharge Goal: Will not experience complications related to urinary retention 05/24/2024 1031 by Leonce Norene BIRCH, RN Outcome: Adequate for Discharge 05/24/2024 1031 by Leonce Norene BIRCH, RN Outcome: Adequate for Discharge   Problem: Coping: Goal: Level of anxiety will decrease 05/24/2024 1031 by Leonce Norene BIRCH, RN Outcome: Adequate for Discharge 05/24/2024 1031 by Leonce Norene BIRCH, RN Outcome: Adequate for Discharge   Problem: Nutrition: Goal: Adequate nutrition will be maintained 05/24/2024 1031 by Leonce Norene BIRCH, RN Outcome: Adequate for Discharge 05/24/2024 1031 by Leonce Norene BIRCH, RN Outcome: Adequate for Discharge   Problem: Skin Integrity: Goal: Risk for impaired skin integrity will decrease 05/24/2024 1031 by Leonce Norene D, RN Outcome: Adequate for Discharge 05/24/2024 1031 by Leonce Norene BIRCH, RN Outcome: Adequate for Discharge   Problem: Tissue Perfusion: Goal: Adequacy of tissue perfusion will improve 05/24/2024 1031 by Leonce Norene BIRCH, RN Outcome:  Adequate for Discharge 05/24/2024 1031 by Leonce Norene BIRCH, RN Outcome: Adequate for Discharge   Problem: Nutritional: Goal: Maintenance of adequate nutrition will improve 05/24/2024 1031 by Leonce Norene BIRCH, RN Outcome: Adequate for Discharge 05/24/2024 1031 by Leonce Norene BIRCH, RN Outcome: Adequate for Discharge Goal: Progress toward achieving an optimal weight will improve 05/24/2024 1031 by Leonce Norene BIRCH, RN Outcome: Adequate for Discharge 05/24/2024 1031 by Leonce Norene BIRCH, RN Outcome: Adequate for Discharge   "

## 2024-05-24 NOTE — TOC Transition Note (Signed)
 Transition of Care [facility]) - Discharge Note   Patient Details  Name: Lisa [name] MRN: [id number] Date of Birth: 12-Jul-1945  Transition of Care [facility]) CM/SW Contact:  Corean ONEIDA Haddock, RN Phone Number: 05/24/2024, 10:44 AM   Clinical Narrative:     Patient will DC un[facility] Anticipated DC date: 05/24/2024  Family notified:Brother Vaughn notified per patient request Transport ab:[id number]  Unit secretary to put sign rx in dc packet   Per MD patient ready for DC to . RN, patient, patient's family, and facility notified of DC. Discharge Summary sent to facility. RN given number for report. DC packet on chart. Ambulance transport requested for patient.   TOC signing off.    Final next level of care: Skilled Nursing Facility Barriers to Discharge: Continued Medical Work up   Patient Goals and CMS Choice            Discharge Placement                       Discharge Plan and Services Additional resources added to the After Visit Summary for                                       Social Drivers of Health (SDOH) Interventions SDOH Screenings   Food Insecurity: No Food Insecurity (05/17/2024)  Housing: High Risk (05/17/2024)  Transportation Needs: Unmet Transportation Needs (05/17/2024)  Utilities: At Risk (05/17/2024)  Alcohol  Screen: Low Risk (02/23/2023)  Depression (PHQ2-9): Low Risk (02/02/2024)  Financial Resource Strain: Medium Risk (04/08/2023)  Social Connections: Unknown (05/17/2024)  Recent Concern: Social Connections - Socially Isolated (03/23/2024)  Tobacco Use: High Risk (05/16/2024)     Readmission Risk Interventions     No data to display

## 2024-05-24 NOTE — Progress Notes (Addendum)
 Transport to bedside to take patient to [facility], RN has not received return call to give report to nurse at [facility]. RN call to [facility] spoke to [place] who states she will tell the nurse to call me.  Patient AxOx4, VSS, IV removed, belongings in red gift bag sent with patient.   Update: 11:38 Spoke w/ Mua @ [facility] for patient report.

## 2024-05-24 NOTE — Discharge Summary (Signed)
 " Physician Discharge Summary   Patient: Lisa [name] MRN: [id number] DOB: 1945-09-11  Admit date:     05/16/2024  Discharge date: 05/24/2024  Discharge Physician: Amaryllis Dare   PCP: Glenard Mire, MD   Recommendations at discharge:  Please obtain CBC and BMP and follow-up Please hold Creon  to see if that will help with her diarrhea as patient thinks that Creon  causing her upset stomach. Patient has multiple eyedrops, couple of them to be used before upcoming surgery.  Please discussed with patient before resuming them. Follow-up with orthopedic surgery Follow-up with primary care provider  Discharge Diagnoses: Principal Problem:   Left displaced femoral neck fracture (HCC) Active Problems:   Hypokalemia   Grade II diastolic dysfunction   Acute postoperative anemia due to expected blood loss   Hyperlipidemia   Diabetes mellitus without complication [facility])   Depression   Hospital Course: Partly taken from H&P.  Lisa [name] is a 78 y.o. female with medical history significant for chronic functional diarrhea, chronic electrolyte imbalance, prior history of DVT not on anticoagulation, hypertension, hyperlipidemia, chronic HFpEF, type 2 diabetes, chronic anxiety/depression, recently admitted (12/6-12/13/25) for acute on chronic HFpEF and electrolyte abnormalities, who presents to the ER after a mechanical fall at home.   On presentation hemodynamically stable, imaging with concern of left displaced femoral neck fracture, case was discussed with orthopedic surgery and they advised admission under TRH and patient will be taken to the OR during the day.  12/23: Blood pressure mildly elevated at 171/73, leukocytosis improved, magnesium  at 1.7, potassium 3.1-replating magnesium  and potassium. Patient was taken to the OR s/p left hemiarthroplasty-tolerated the procedure well.  12/24: Blood pressure mildly elevated, hemoglobin with some decreased to 9.3, some leukocytosis  likely reactive.  CBG elevated above 200.  12/25: Patient had 1 episode of low-grade fever at 100.1, no obvious sign of infection or complaint.  Leukocytosis resolved.  Hemoglobin with further decreased to 7.7.  Awaiting SNF placement.  12/26: Remained hemodynamically stable with some improvement of hemoglobin to 8.6.  Had a bed offer-pending insurance authorization for SNF.  12/27: Medically stable with pending insurance authorization for SNF.  12/28: Hemodynamically stable, having some diarrhea after taking pills.  Will trial iron supplement with meals.  Awaiting insurance authorization.  12/29: Obtained authorization, facility will take her tomorrow.  Creon  was discontinued at her request stating that it is causing her upset stomach.  12/30: Remained hemodynamically stable.  We continued Creon  on discharge and rehab facility can discussed with patient and hold if she request to see if that will help with her intermittent diarrhea.  Patient also has multiple eyedrops, couple of them to be used before upcoming eye surgery.  Please discussed with patient before resuming them.  He is also being provided with some pain medications to use as needed.  Please avoid constipation while taking opioids for pain.  Patient will follow-up with orthopedic surgery for her recent fracture and surgery.  Patient will continue the rest of her home medications and need to have a close follow-up with her providers for further assistance.  Assessment and Plan: * Left displaced femoral neck fracture (HCC) Secondary to mechanical fall s/p hemiarthroplasty on 12/23 -Continue with pain management -PT and OT recommending SNF where she is being discharged for rehab.  Hypokalemia Resolved with repletion. -Continue to monitor  Acute postoperative anemia due to expected blood loss Hemoglobin decreased to 9.3>>7.7>>8.6 after hip fracture and surgery. - Continue with iron supplement -Monitor  hemoglobin -Transfuse if below  7  Hypertension. - Continuing home medications  Grade II diastolic dysfunction Last 2D echo done on 02/26/2021 revealed LVEF 60 to 65% with grade 2 diastolic dysfunction.  Clinically appears euvolemic -Strict intake and output -Daily weight  Hyperlipidemia - Continue home statin  Diabetes mellitus without complication (HCC) CBG now within goal, A1c of 6.4 - Continue with moderate SSI - Continue with 3 units with meal  Depression - Continue home meds   Pain control - [place]  Controlled Substance Reporting System database was reviewed. and patient was instructed, not to drive, operate heavy machinery, perform activities at heights, swimming or participation in water activities or provide baby-sitting services while on Pain, Sleep and Anxiety Medications; until their outpatient Physician has advised to do so again. Also recommended to not to take more than prescribed Pain, Sleep and Anxiety Medications.  Consultants: Orthopedic surgery Procedures performed: Hemiarthroplasty Disposition: Skilled nursing facility Diet recommendation:  Cardiac and Carb modified diet DISCHARGE MEDICATION: Allergies as of 05/24/2024       Reactions   Other Itching   Nucinta - Hallucinations   Tapentadol Hcl Itching   Hallucinations   Actos  [pioglitazone ]    History of CHF   Penicillins Rash        Medication List     STOP taking these medications    cyanocobalamin  1000 MCG tablet   traMADol  50 MG tablet Commonly known as: ULTRAM        TAKE these medications    acetaminophen  500 MG tablet Commonly known as: TYLENOL  Take 2 tablets by mouth every 6 (six) hours as needed.   amLODipine  10 MG tablet Commonly known as: NORVASC  Take 1 tablet (10 mg total) by mouth daily.   artificial tears ophthalmic solution Place 2 drops into the right eye as needed for dry eyes.   aspirin  81 MG tablet Take 81 mg by mouth daily.   atropine  1 % ophthalmic  solution Place 1 drop into the left eye 3 (three) times daily.   brimonidine  0.2 % ophthalmic solution Commonly known as: ALPHAGAN  Place 1 drop into both eyes 2 (two) times daily.   citalopram  10 MG tablet Commonly known as: CELEXA  Take 1 tablet (10 mg total) by mouth daily.   clopidogrel  75 MG tablet Commonly known as: Plavix  Take 1 tablet (75 mg total) by mouth daily.   dapagliflozin  propanediol 10 MG Tabs tablet Commonly known as: Farxiga  Take 1 tablet (10 mg total) by mouth daily. TAKE 1 TABLET BY MOUTH EVERY DAY BEFORE BREAKFAST   diphenoxylate -atropine  2.5-0.025 MG tablet Commonly known as: LOMOTIL  Take 1 tablet by mouth 3 (three) times daily as needed for diarrhea or loose stools.   dorzolamide -timolol  2-0.5 % ophthalmic solution Commonly known as: COSOPT  Place 1 drop into both eyes 2 (two) times daily.   Fe Fum-Vit C-Vit B12-FA Caps capsule Commonly known as: TRIGELS-F FORTE Take 1 capsule by mouth 2 (two) times daily.   feeding supplement Liqd Take 237 mLs by mouth 2 (two) times daily between meals.   lipase/protease/amylase 63999 UNITS Cpep capsule Commonly known as: CREON  Take 2 capsules (72,000 Units total) by mouth 3 (three) times daily before meals.   losartan  100 MG tablet Commonly known as: COZAAR  Take 1 tablet (100 mg total) by mouth daily.   metoprolol  succinate 25 MG 24 hr tablet Commonly known as: Toprol  XL Take 1/2 tablets (12.5 mg total) by mouth daily.   multivitamin tablet Take 1 tablet by mouth daily.   nitroGLYCERIN  0.4 MG SL tablet Commonly known  as: NITROSTAT  Place 1 tablet (0.4 mg total) under the tongue every 5 (five) minutes as needed for chest pain.   oxyCODONE  5 MG immediate release tablet Commonly known as: Oxy IR/ROXICODONE  Take 1-2 tablets (5-10 mg total) by mouth every 4 (four) hours as needed for moderate pain (pain score 4-6) or breakthrough pain.   prednisoLONE  acetate 1 % ophthalmic suspension Commonly known as: PRED  FORTE Place 1 drop into the left eye 4 (four) times daily.   rosuvastatin  10 MG tablet Commonly known as: CRESTOR  Take 1 tablet (10 mg total) by mouth daily.   Vitamin D  (Ergocalciferol ) 1.25 MG (50000 UNIT) Caps capsule Commonly known as: DRISDOL  Take 1 capsule (50,000 Units total) by mouth every 7 (seven) days.               Discharge Care Instructions  (From admission, onward)           Start     Ordered   05/24/24 0000  Leave dressing on - Keep it clean, dry, and intact until clinic visit        05/24/24 1011            Contact information for follow-up providers     Ezra Jackquline RAMAN, MD Follow up in 14 day(s).   Specialty: Orthopedic Surgery Why: Staple Removal. Contact information: [address] [place] [address] [phone]              Contact information for after-discharge care     Destination     [facility] .   Service: Skilled Nursing Contact information: [address] [place]  [address] [phone]                    Discharge Exam: Fredricka Weights   05/16/24 2012 05/17/24 0408  Weight: 54.4 kg 52.9 kg   General.  Frail and malnourished elderly lady, in no acute distress. Pulmonary.  Lungs clear bilaterally, normal respiratory effort. CV.  Regular rate and rhythm, no JVD, rub or murmur. Abdomen.  Soft, nontender, nondistended, BS positive. CNS.  Alert and oriented .  No focal neurologic deficit. Extremities.  No edema,  pulses intact and symmetrical. Psychiatry.  Judgment and insight appears normal.   Condition at discharge: stable  The results of significant diagnostics from this hospitalization (including imaging, microbiology, ancillary and laboratory) are listed below for reference.   Imaging Studies: DG HIP UNILAT WITH PELVIS 2-3 VIEWS LEFT Result Date: 05/17/2024 CLINICAL DATA:  Postop. EXAM: DG HIP (WITH OR WITHOUT  PELVIS) 2-3V LEFT COMPARISON:  Radiograph yesterday FINDINGS: Left hip hemiarthroplasty in expected alignment. No periprosthetic lucency or fracture. Recent postsurgical change includes air and edema in the soft tissues. Overlying skin staples in place. IMPRESSION: Left hip hemiarthroplasty without immediate postoperative complication. Electronically Signed   By: Andrea Gasman M.D.   On: 05/17/2024 15:14   DG Chest Portable 1 View Result Date: 05/16/2024 EXAM: 1 VIEW(S) XRAY OF THE CHEST 05/16/2024 11:40:00 PM COMPARISON: 05/02/2024 CLINICAL HISTORY: pre op FINDINGS: LUNGS AND PLEURA: Mild diffuse interstitial prominence. No focal pulmonary opacity. No pleural effusion. No pneumothorax. HEART AND MEDIASTINUM: Cardiomegaly, unchanged. Aortic atherosclerosis and tortuosity. BONES AND SOFT TISSUES: No acute osseous abnormality. IMPRESSION: 1. Mild diffuse interstitial prominence no edema. Cardiomegaly. Electronically signed by: Norman Gatlin MD 05/16/2024 11:46 PM EST RP Workstation: [id number]   DG Hip Unilat W or Wo Pelvis 2-3 Views Left Result Date: 05/16/2024 CLINICAL DATA:  Left hip pain after fall. EXAM: DG HIP (WITH OR WITHOUT PELVIS) 2-3V LEFT COMPARISON:  None Available. FINDINGS: Displaced left femoral neck fracture. Femoral head is seated, no hip dislocation. No pubic ramus fracture. The bones are diffusely under mineralized. IMPRESSION: Displaced left femoral neck fracture. Electronically Signed   By: Andrea Gasman M.D.   On: 05/16/2024 23:05   CT Head Wo Contrast Result Date: 05/16/2024 CLINICAL DATA:  Status post fall. EXAM: CT HEAD WITHOUT CONTRAST TECHNIQUE: Contiguous axial images were obtained from the base of the skull through the vertex without intravenous contrast. RADIATION DOSE REDUCTION: This exam was performed according to the departmental dose-optimization program which includes automated exposure control, adjustment of the mA and/or kV according to patient size and/or use  of iterative reconstruction technique. COMPARISON:  04/07/2023 FINDINGS: Brain: No intracranial hemorrhage, mass effect, or midline shift. Stable atrophy and chronic small vessel ischemia. No hydrocephalus. The basilar cisterns are patent. Bilateral basal gangliar mineralization. No evidence of territorial infarct or acute ischemia. No extra-axial or intracranial fluid collection. Vascular: Atherosclerosis of skullbase vasculature without hyperdense vessel or abnormal calcification. Skull: No fracture or focal lesion. Sinuses/Orbits: Postsurgical change in the right globe. No acute findings. Other: None. IMPRESSION: 1. No acute intracranial abnormality. No skull fracture. 2. Stable atrophy and chronic small vessel ischemia. Electronically Signed   By: Andrea Gasman M.D.   On: 05/16/2024 22:01   CT Cervical Spine Wo Contrast Result Date: 05/16/2024 CLINICAL DATA:  Status post fall. EXAM: CT CERVICAL SPINE WITHOUT CONTRAST TECHNIQUE: Multidetector CT imaging of the cervical spine was performed without intravenous contrast. Multiplanar CT image reconstructions were also generated. RADIATION DOSE REDUCTION: This exam was performed according to the departmental dose-optimization program which includes automated exposure control, adjustment of the mA and/or kV according to patient size and/or use of iterative reconstruction technique. COMPARISON:  02/25/2021 FINDINGS: Alignment: Straightening of normal lordosis. Redemonstrated trace retrolisthesis of C5 on C6. No traumatic subluxation. Skull base and vertebrae: No acute fracture. Vertebral body heights are maintained. The dens and skull base are intact. Degenerative pannus at C1-C2. Soft tissues and spinal canal: No prevertebral fluid or swelling. No visible canal hematoma. Disc levels: Degenerative disc disease is most prominent at C5-C6. Multilevel facet hypertrophy. Upper chest: No acute findings. Other: Carotid calcifications. IMPRESSION: Degenerative change in  the cervical spine without acute fracture or subluxation. Electronically Signed   By: Andrea Gasman M.D.   On: 05/16/2024 21:59   CT CHEST ABDOMEN PELVIS W CONTRAST Result Date: 05/04/2024 EXAM: CT CHEST, ABDOMEN AND PELVIS WITH CONTRAST 05/04/2024 05:29:03 PM TECHNIQUE: CT of the chest, abdomen and pelvis was performed with the administration of 100 mL of iohexol  (OMNIPAQUE ) 300 MG/ML solution intravenous contrast. Multiplanar reformatted images are provided for review. Automated exposure control, iterative reconstruction, and/or weight based adjustment of the mA/kV was utilized to reduce the radiation dose to as low as reasonably achievable. COMPARISON: CTA chest dated 04/08/2023. CLINICAL HISTORY: Mediastinal mass; and diarrhea - rule out carcinoid syndrome - abnormal weight loss. FINDINGS: CHEST: MEDIASTINUM AND LYMPH NODES: Mild cardiomegaly. Moderate coronary atherosclerosis of the LAD and left circumflex. Thoracic aortic atherosclerosis. Stable 3.0 cm mass with benign anterior scalloping of the T4-T5 vertebral bodies, favoring a benign foregut duplication cyst. The central airways are clear. No mediastinal, hilar or axillary lymphadenopathy. LUNGS AND PLEURA: Small bilateral pleural effusions, chronic, with associated lower lobe atelectasis. Faint patchy ground-glass opacity in the right upper lobe (images 25 and 41), favoring post-infectious/inflammatory scarring when correlating with the  prior. No pneumothorax. ABDOMEN AND PELVIS: LIVER: The liver is unremarkable. GALLBLADDER AND BILE DUCTS: Gallbladder is unremarkable. No biliary ductal dilatation. SPLEEN: No acute abnormality. PANCREAS: No acute abnormality. ADRENAL GLANDS: No acute abnormality. KIDNEYS, URETERS AND BLADDER: No stones in the kidneys or ureters. No hydronephrosis. No perinephric or periureteral stranding. Urinary bladder is unremarkable. GI AND BOWEL: Stomach demonstrates no acute abnormality. There is no bowel obstruction.  REPRODUCTIVE ORGANS: Post hysterectomy. PERITONEUM AND RETROPERITONEUM: No ascites. No free air. VASCULATURE: Aorta is normal in caliber. ABDOMINAL AND PELVIS LYMPH NODES: No lymphadenopathy. BONES AND SOFT TISSUES: Mild degenerative changes of the visualized thoracolumbar spine. The T4-T5 vertebral body scalloping associated with the mediastinal mass is benign and stable. No acute osseous abnormality. No focal soft tissue abnormality. IMPRESSION: 1. 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 2. No findings suspicious for malignancy/metastatic disease. 3. Suspected postinfectious/inflammatory scarring in the right upper lobe. 4. Small bilateral pleural effusions, chronic. Electronically signed by: Pinkie Pebbles MD 05/04/2024 07:59 PM EST RP Workstation: [id number]   DG Chest Port 1 View Result Date: 05/02/2024 CLINICAL DATA:  Fever EXAM: PORTABLE CHEST 1 VIEW COMPARISON:  Chest radiograph dated 03/23/2024 FINDINGS: Normal lung volumes. Diffuse interstitial opacities with focal hazy/patchy opacities in the right upper lung. No pleural effusion or pneumothorax. Similar enlarged cardiomediastinal silhouette. No acute osseous abnormality. IMPRESSION: Diffuse interstitial opacities with focal hazy/patchy opacities in the right upper lung, which may represent pneumonia on a background of pulmonary edema. Electronically Signed   By: Limin  Xu M.D.   On: 05/02/2024 17:28   DG Abd 1 View Result Date: 05/01/2024 EXAM: 1 VIEW XRAY OF THE ABDOMEN 05/01/2024 09:08:25 AM COMPARISON: None available. CLINICAL HISTORY: [id number] Encopresis [id number] Encopresis. FINDINGS: BOWEL: Mild diffuse gaseous distention of the bowel. Fecal material within the right colon. SOFT TISSUES: Moderate vascular calcifications. BONES: No acute fracture. HEART: The heart is enlarged. IMPRESSION: 1. Mild diffuse gaseous distention of the bowel and fecal material within the right colon, consistent with  encopresis. 2. Enlarged heart. 3. Moderate vascular calcifications. Electronically signed by: Evalene Coho MD 05/01/2024 09:13 AM EST RP Workstation: [id number]    Microbiology: Results for orders placed or performed during the hospital encounter of 05/16/24  Surgical pcr screen     Status: None   Collection Time: 05/17/24  6:06 AM   Specimen: Nasal Mucosa; Nasal Swab  Result Value Ref Range Status   MRSA, PCR NEGATIVE NEGATIVE Final   Staphylococcus aureus NEGATIVE NEGATIVE Final    Comment: (NOTE) The Xpert SA Assay (FDA approved for NASAL specimens in patients 75 years of age and older), is one component of a comprehensive surveillance program. It is not intended to diagnose infection nor to guide or monitor treatment. Performed at [facility], [address] [place], [place] [address]     Labs: CBC: Recent Labs  Lab 05/18/24 [phone] 05/19/24 0804 05/20/24 0954 05/23/24 0559  WBC 13.5* 7.8 7.5 5.9  HGB 9.3* 7.7* 8.6* 8.2*  HCT 29.0* 23.0* 26.7* 25.6*  MCV 87.6 85.2 87.5 86.5  PLT 278 225 250 282   Basic Metabolic Panel: Recent Labs  Lab 05/18/24 0437 05/23/24 0559  NA 138 138  K 3.9 3.6  CL 105 106  CO2 19* 23  GLUCOSE 256* 138*  BUN 14 12  CREATININE 0.82 0.63  CALCIUM  8.0* 8.4*   Liver Function Tests: No results for input(s): AST, ALT, ALKPHOS, BILITOT, PROT, ALBUMIN in the last 168 hours. CBG: Recent Labs  Lab 05/23/24 0816 05/23/24 1153 05/23/24 1637 05/23/24 2058 05/24/24 0753  GLUCAP 157* 132* 150* 74 143*    Discharge time spent: greater than 30 minutes.  This record has been created using Conservation officer, historic buildings. Errors have been sought and corrected,but may not always be located. Such creation errors do not reflect on the standard of care.   Signed: Amaryllis Dare, MD [facility] 05/24/2024 "

## 2024-05-24 NOTE — Plan of Care (Signed)

## 2024-06-07 ENCOUNTER — Ambulatory Visit: Admitting: Family Medicine

## 2024-06-15 ENCOUNTER — Inpatient Hospital Stay: Admitting: Family Medicine
